# Patient Record
Sex: Female | Born: 1935
Health system: Southern US, Community
[De-identification: ages and names within clinical notes are randomized; demographics above are authoritative.]

## PROBLEM LIST (undated history)

## (undated) DIAGNOSIS — N3010 Interstitial cystitis (chronic) without hematuria: Secondary | ICD-10-CM

## (undated) DIAGNOSIS — E119 Type 2 diabetes mellitus without complications: Secondary | ICD-10-CM

## (undated) DIAGNOSIS — I471 Supraventricular tachycardia, unspecified: Secondary | ICD-10-CM

## (undated) DIAGNOSIS — K219 Gastro-esophageal reflux disease without esophagitis: Secondary | ICD-10-CM

## (undated) DIAGNOSIS — D126 Benign neoplasm of colon, unspecified: Secondary | ICD-10-CM

## (undated) DIAGNOSIS — K5900 Constipation, unspecified: Secondary | ICD-10-CM

## (undated) DIAGNOSIS — K648 Other hemorrhoids: Secondary | ICD-10-CM

## (undated) DIAGNOSIS — K449 Diaphragmatic hernia without obstruction or gangrene: Secondary | ICD-10-CM

## (undated) DIAGNOSIS — K2970 Gastritis, unspecified, without bleeding: Secondary | ICD-10-CM

## (undated) DIAGNOSIS — K2990 Gastroduodenitis, unspecified, without bleeding: Secondary | ICD-10-CM

## (undated) DIAGNOSIS — R1314 Dysphagia, pharyngoesophageal phase: Secondary | ICD-10-CM

## (undated) DIAGNOSIS — K649 Unspecified hemorrhoids: Secondary | ICD-10-CM

## (undated) DIAGNOSIS — K5730 Diverticulosis of large intestine without perforation or abscess without bleeding: Secondary | ICD-10-CM

## (undated) DIAGNOSIS — K222 Esophageal obstruction: Secondary | ICD-10-CM

## (undated) DIAGNOSIS — I10 Essential (primary) hypertension: Secondary | ICD-10-CM

## (undated) DIAGNOSIS — E785 Hyperlipidemia, unspecified: Secondary | ICD-10-CM

## (undated) DIAGNOSIS — R112 Nausea with vomiting, unspecified: Secondary | ICD-10-CM

## (undated) DIAGNOSIS — R011 Cardiac murmur, unspecified: Secondary | ICD-10-CM

## (undated) DIAGNOSIS — M1990 Unspecified osteoarthritis, unspecified site: Secondary | ICD-10-CM

## (undated) DIAGNOSIS — S62102A Fracture of unspecified carpal bone, left wrist, initial encounter for closed fracture: Secondary | ICD-10-CM

## (undated) DIAGNOSIS — Z9889 Other specified postprocedural states: Secondary | ICD-10-CM

## (undated) HISTORY — DX: Constipation, unspecified: K59.00

## (undated) HISTORY — DX: Diaphragmatic hernia without obstruction or gangrene: K44.9

## (undated) HISTORY — PX: TOTAL KNEE ARTHROPLASTY: SHX125

## (undated) HISTORY — DX: Interstitial cystitis (chronic) without hematuria: N30.10

## (undated) HISTORY — DX: Gastritis, unspecified, without bleeding: K29.70

## (undated) HISTORY — DX: Type 2 diabetes mellitus without complications: E11.9

## (undated) HISTORY — DX: Dysphagia, pharyngoesophageal phase: R13.14

## (undated) HISTORY — DX: Esophageal obstruction: K22.2

## (undated) HISTORY — DX: Benign neoplasm of colon, unspecified: D12.6

## (undated) HISTORY — DX: Essential (primary) hypertension: I10

## (undated) HISTORY — PX: RECTOCELE REPAIR: SHX761

## (undated) HISTORY — DX: Gastroduodenitis, unspecified, without bleeding: K29.70

## (undated) HISTORY — PX: ABDOMINAL HYSTERECTOMY: SHX81

## (undated) HISTORY — PX: CHOLECYSTECTOMY: SHX55

## (undated) HISTORY — DX: Unspecified hemorrhoids: K64.9

## (undated) HISTORY — DX: Diverticulosis of large intestine without perforation or abscess without bleeding: K57.30

## (undated) HISTORY — PX: LAPAROSCOPIC VAGINAL HYSTERECTOMY: SUR798

## (undated) HISTORY — PX: DILATION AND CURETTAGE OF UTERUS: SHX78

## (undated) HISTORY — DX: Gastroduodenitis, unspecified, without bleeding: K29.90

## (undated) HISTORY — DX: Gastro-esophageal reflux disease without esophagitis: K21.9

## (undated) HISTORY — DX: Unspecified osteoarthritis, unspecified site: M19.90

## (undated) HISTORY — DX: Cardiac murmur, unspecified: R01.1

## (undated) HISTORY — PX: BREAST LUMPECTOMY: SHX2

## (undated) HISTORY — DX: Supraventricular tachycardia, unspecified: I47.10

## (undated) HISTORY — DX: Other hemorrhoids: K64.8

## (undated) HISTORY — DX: Hyperlipidemia, unspecified: E78.5

## (undated) HISTORY — PX: FOOT SURGERY: SHX648

---

## 2000-08-08 ENCOUNTER — Encounter: Payer: Self-pay | Admitting: Family Medicine

## 2000-08-08 ENCOUNTER — Ambulatory Visit (HOSPITAL_COMMUNITY): Admission: RE | Admit: 2000-08-08 | Discharge: 2000-08-08 | Payer: Self-pay | Admitting: Family Medicine

## 2001-05-22 ENCOUNTER — Encounter: Admission: RE | Admit: 2001-05-22 | Discharge: 2001-05-22 | Payer: Self-pay | Admitting: Orthopaedic Surgery

## 2001-05-22 ENCOUNTER — Encounter: Payer: Self-pay | Admitting: Orthopaedic Surgery

## 2001-06-22 ENCOUNTER — Encounter: Admission: RE | Admit: 2001-06-22 | Discharge: 2001-06-22 | Payer: Self-pay | Admitting: Orthopaedic Surgery

## 2001-06-22 ENCOUNTER — Encounter: Payer: Self-pay | Admitting: Orthopaedic Surgery

## 2001-07-06 ENCOUNTER — Encounter: Payer: Self-pay | Admitting: Orthopaedic Surgery

## 2001-07-06 ENCOUNTER — Encounter: Admission: RE | Admit: 2001-07-06 | Discharge: 2001-07-06 | Payer: Self-pay | Admitting: Orthopaedic Surgery

## 2001-07-20 ENCOUNTER — Encounter: Payer: Self-pay | Admitting: Orthopaedic Surgery

## 2001-07-20 ENCOUNTER — Encounter: Admission: RE | Admit: 2001-07-20 | Discharge: 2001-07-20 | Payer: Self-pay | Admitting: Orthopaedic Surgery

## 2001-08-10 ENCOUNTER — Ambulatory Visit (HOSPITAL_COMMUNITY): Admission: RE | Admit: 2001-08-10 | Discharge: 2001-08-10 | Payer: Self-pay | Admitting: Family Medicine

## 2001-08-10 ENCOUNTER — Encounter: Payer: Self-pay | Admitting: Family Medicine

## 2002-03-08 HISTORY — PX: ESOPHAGOGASTRODUODENOSCOPY: SHX1529

## 2002-03-08 HISTORY — PX: COLONOSCOPY: SHX174

## 2002-04-30 ENCOUNTER — Ambulatory Visit (HOSPITAL_COMMUNITY): Admission: RE | Admit: 2002-04-30 | Discharge: 2002-04-30 | Payer: Self-pay | Admitting: Otolaryngology

## 2002-04-30 ENCOUNTER — Encounter: Payer: Self-pay | Admitting: Otolaryngology

## 2002-08-04 ENCOUNTER — Encounter: Payer: Self-pay | Admitting: Urology

## 2002-08-04 ENCOUNTER — Encounter: Admission: RE | Admit: 2002-08-04 | Discharge: 2002-08-04 | Payer: Self-pay | Admitting: Urology

## 2002-08-06 ENCOUNTER — Encounter (INDEPENDENT_AMBULATORY_CARE_PROVIDER_SITE_OTHER): Payer: Self-pay | Admitting: Specialist

## 2002-08-06 ENCOUNTER — Encounter (INDEPENDENT_AMBULATORY_CARE_PROVIDER_SITE_OTHER): Payer: Self-pay | Admitting: *Deleted

## 2002-08-06 ENCOUNTER — Ambulatory Visit (HOSPITAL_BASED_OUTPATIENT_CLINIC_OR_DEPARTMENT_OTHER): Admission: RE | Admit: 2002-08-06 | Discharge: 2002-08-06 | Payer: Self-pay | Admitting: Certified Registered"

## 2002-08-11 ENCOUNTER — Ambulatory Visit (HOSPITAL_COMMUNITY): Admission: RE | Admit: 2002-08-11 | Discharge: 2002-08-11 | Payer: Self-pay | Admitting: Family Medicine

## 2002-08-11 ENCOUNTER — Encounter: Payer: Self-pay | Admitting: Family Medicine

## 2002-12-09 ENCOUNTER — Ambulatory Visit (HOSPITAL_COMMUNITY): Admission: RE | Admit: 2002-12-09 | Discharge: 2002-12-09 | Payer: Self-pay | Admitting: Obstetrics and Gynecology

## 2002-12-09 ENCOUNTER — Encounter: Payer: Self-pay | Admitting: Obstetrics and Gynecology

## 2003-08-12 ENCOUNTER — Ambulatory Visit (HOSPITAL_COMMUNITY): Admission: RE | Admit: 2003-08-12 | Discharge: 2003-08-12 | Payer: Self-pay | Admitting: Family Medicine

## 2003-12-22 ENCOUNTER — Ambulatory Visit: Payer: Self-pay | Admitting: Gastroenterology

## 2004-01-02 ENCOUNTER — Ambulatory Visit: Payer: Self-pay | Admitting: Gastroenterology

## 2004-01-23 ENCOUNTER — Ambulatory Visit: Payer: Self-pay | Admitting: Gastroenterology

## 2004-01-23 HISTORY — PX: ESOPHAGOGASTRODUODENOSCOPY: SHX1529

## 2004-01-23 HISTORY — PX: COLONOSCOPY: SHX174

## 2004-02-19 HISTORY — PX: CATARACT EXTRACTION: SUR2

## 2004-04-20 ENCOUNTER — Ambulatory Visit: Payer: Self-pay | Admitting: Gastroenterology

## 2004-05-03 ENCOUNTER — Encounter (HOSPITAL_COMMUNITY): Admission: RE | Admit: 2004-05-03 | Discharge: 2004-06-02 | Payer: Self-pay | Admitting: Family Medicine

## 2004-06-11 ENCOUNTER — Ambulatory Visit: Payer: Self-pay | Admitting: Podiatry

## 2004-06-15 ENCOUNTER — Ambulatory Visit: Payer: Self-pay | Admitting: Podiatry

## 2004-08-14 ENCOUNTER — Ambulatory Visit (HOSPITAL_COMMUNITY): Admission: RE | Admit: 2004-08-14 | Discharge: 2004-08-14 | Payer: Self-pay | Admitting: Family Medicine

## 2004-11-14 ENCOUNTER — Ambulatory Visit (HOSPITAL_COMMUNITY): Admission: RE | Admit: 2004-11-14 | Discharge: 2004-11-14 | Payer: Self-pay | Admitting: Family Medicine

## 2005-04-12 ENCOUNTER — Ambulatory Visit: Payer: Self-pay | Admitting: Gastroenterology

## 2005-07-04 ENCOUNTER — Ambulatory Visit (HOSPITAL_COMMUNITY): Admission: RE | Admit: 2005-07-04 | Discharge: 2005-07-04 | Payer: Self-pay | Admitting: Otolaryngology

## 2005-08-15 ENCOUNTER — Ambulatory Visit (HOSPITAL_COMMUNITY): Admission: RE | Admit: 2005-08-15 | Discharge: 2005-08-15 | Payer: Self-pay | Admitting: Family Medicine

## 2005-09-18 ENCOUNTER — Ambulatory Visit: Payer: Self-pay | Admitting: Gastroenterology

## 2005-09-25 ENCOUNTER — Ambulatory Visit: Payer: Self-pay | Admitting: Gastroenterology

## 2005-09-25 ENCOUNTER — Encounter (INDEPENDENT_AMBULATORY_CARE_PROVIDER_SITE_OTHER): Payer: Self-pay | Admitting: *Deleted

## 2005-09-25 DIAGNOSIS — K2970 Gastritis, unspecified, without bleeding: Secondary | ICD-10-CM | POA: Insufficient documentation

## 2005-09-25 DIAGNOSIS — K2990 Gastroduodenitis, unspecified, without bleeding: Secondary | ICD-10-CM

## 2005-09-25 HISTORY — PX: COLONOSCOPY: SHX174

## 2005-09-25 HISTORY — PX: ESOPHAGOGASTRODUODENOSCOPY: SHX1529

## 2006-07-31 ENCOUNTER — Ambulatory Visit: Payer: Self-pay | Admitting: Gastroenterology

## 2006-08-18 ENCOUNTER — Ambulatory Visit (HOSPITAL_COMMUNITY): Admission: RE | Admit: 2006-08-18 | Discharge: 2006-08-18 | Payer: Self-pay | Admitting: Family Medicine

## 2007-07-02 DIAGNOSIS — D126 Benign neoplasm of colon, unspecified: Secondary | ICD-10-CM | POA: Insufficient documentation

## 2007-07-02 DIAGNOSIS — K222 Esophageal obstruction: Secondary | ICD-10-CM | POA: Insufficient documentation

## 2007-07-02 DIAGNOSIS — K5730 Diverticulosis of large intestine without perforation or abscess without bleeding: Secondary | ICD-10-CM | POA: Insufficient documentation

## 2007-07-02 DIAGNOSIS — K648 Other hemorrhoids: Secondary | ICD-10-CM | POA: Insufficient documentation

## 2007-07-02 DIAGNOSIS — R1314 Dysphagia, pharyngoesophageal phase: Secondary | ICD-10-CM | POA: Insufficient documentation

## 2007-07-02 DIAGNOSIS — K449 Diaphragmatic hernia without obstruction or gangrene: Secondary | ICD-10-CM | POA: Insufficient documentation

## 2007-07-02 DIAGNOSIS — K219 Gastro-esophageal reflux disease without esophagitis: Secondary | ICD-10-CM | POA: Insufficient documentation

## 2007-07-02 DIAGNOSIS — K5900 Constipation, unspecified: Secondary | ICD-10-CM | POA: Insufficient documentation

## 2007-08-19 ENCOUNTER — Ambulatory Visit (HOSPITAL_COMMUNITY): Admission: RE | Admit: 2007-08-19 | Discharge: 2007-08-19 | Payer: Self-pay | Admitting: Unknown Physician Specialty

## 2008-01-19 ENCOUNTER — Ambulatory Visit (HOSPITAL_COMMUNITY): Admission: RE | Admit: 2008-01-19 | Discharge: 2008-01-19 | Payer: Self-pay | Admitting: Family Medicine

## 2008-03-01 ENCOUNTER — Ambulatory Visit: Payer: Self-pay | Admitting: Gastroenterology

## 2008-03-01 DIAGNOSIS — N3010 Interstitial cystitis (chronic) without hematuria: Secondary | ICD-10-CM | POA: Insufficient documentation

## 2008-03-01 DIAGNOSIS — E119 Type 2 diabetes mellitus without complications: Secondary | ICD-10-CM | POA: Insufficient documentation

## 2008-03-04 ENCOUNTER — Encounter: Payer: Self-pay | Admitting: Gastroenterology

## 2008-03-04 ENCOUNTER — Ambulatory Visit (HOSPITAL_COMMUNITY): Admission: RE | Admit: 2008-03-04 | Discharge: 2008-03-04 | Payer: Self-pay | Admitting: Gastroenterology

## 2008-03-21 ENCOUNTER — Ambulatory Visit: Payer: Self-pay | Admitting: Gastroenterology

## 2008-03-21 ENCOUNTER — Ambulatory Visit (HOSPITAL_COMMUNITY): Admission: RE | Admit: 2008-03-21 | Discharge: 2008-03-21 | Payer: Self-pay | Admitting: Gastroenterology

## 2008-03-21 HISTORY — PX: ESOPHAGEAL MANOMETRY: SHX1526

## 2008-03-29 ENCOUNTER — Telehealth: Payer: Self-pay | Admitting: Gastroenterology

## 2008-04-01 ENCOUNTER — Ambulatory Visit: Payer: Self-pay | Admitting: Gastroenterology

## 2008-04-01 DIAGNOSIS — K224 Dyskinesia of esophagus: Secondary | ICD-10-CM | POA: Insufficient documentation

## 2008-04-05 ENCOUNTER — Inpatient Hospital Stay (HOSPITAL_COMMUNITY): Admission: RE | Admit: 2008-04-05 | Discharge: 2008-04-08 | Payer: Self-pay | Admitting: Orthopaedic Surgery

## 2008-04-07 ENCOUNTER — Ambulatory Visit: Payer: Self-pay | Admitting: Vascular Surgery

## 2008-04-07 ENCOUNTER — Encounter (INDEPENDENT_AMBULATORY_CARE_PROVIDER_SITE_OTHER): Payer: Self-pay | Admitting: Orthopaedic Surgery

## 2008-04-08 ENCOUNTER — Inpatient Hospital Stay: Admission: RE | Admit: 2008-04-08 | Discharge: 2008-05-05 | Payer: Self-pay | Admitting: Internal Medicine

## 2008-05-10 ENCOUNTER — Encounter (HOSPITAL_COMMUNITY): Admission: RE | Admit: 2008-05-10 | Discharge: 2008-06-09 | Payer: Self-pay | Admitting: Orthopaedic Surgery

## 2008-06-06 ENCOUNTER — Encounter: Admission: RE | Admit: 2008-06-06 | Discharge: 2008-06-06 | Payer: Self-pay | Admitting: Orthopaedic Surgery

## 2008-06-13 ENCOUNTER — Encounter (HOSPITAL_COMMUNITY): Admission: RE | Admit: 2008-06-13 | Discharge: 2008-07-15 | Payer: Self-pay | Admitting: Orthopaedic Surgery

## 2008-07-19 ENCOUNTER — Encounter (HOSPITAL_COMMUNITY): Admission: RE | Admit: 2008-07-19 | Discharge: 2008-07-22 | Payer: Self-pay | Admitting: Orthopaedic Surgery

## 2008-08-23 ENCOUNTER — Ambulatory Visit (HOSPITAL_COMMUNITY): Admission: RE | Admit: 2008-08-23 | Discharge: 2008-08-23 | Payer: Self-pay | Admitting: Family Medicine

## 2008-11-22 ENCOUNTER — Ambulatory Visit: Payer: Self-pay | Admitting: Gastroenterology

## 2008-11-22 DIAGNOSIS — K861 Other chronic pancreatitis: Secondary | ICD-10-CM | POA: Insufficient documentation

## 2009-04-28 ENCOUNTER — Ambulatory Visit: Payer: Self-pay | Admitting: Gastroenterology

## 2009-04-28 LAB — CONVERTED CEMR LAB
Ferritin: 45.8 ng/mL (ref 10.0–291.0)
Folate: 16.1 ng/mL
Saturation Ratios: 15.4 % — ABNORMAL LOW (ref 20.0–50.0)
Tissue Transglutaminase Ab, IgA: 0.6 units (ref <7)
Transferrin: 254.7 mg/dL (ref 212.0–360.0)
Vitamin B-12: 300 pg/mL (ref 211–911)

## 2009-06-29 ENCOUNTER — Encounter (INDEPENDENT_AMBULATORY_CARE_PROVIDER_SITE_OTHER): Payer: Self-pay | Admitting: *Deleted

## 2009-06-29 ENCOUNTER — Ambulatory Visit: Payer: Self-pay | Admitting: Gastroenterology

## 2009-06-29 DIAGNOSIS — R1013 Epigastric pain: Secondary | ICD-10-CM | POA: Insufficient documentation

## 2009-06-29 DIAGNOSIS — Z86010 Personal history of colonic polyps: Secondary | ICD-10-CM | POA: Insufficient documentation

## 2009-06-29 DIAGNOSIS — R197 Diarrhea, unspecified: Secondary | ICD-10-CM | POA: Insufficient documentation

## 2009-06-29 DIAGNOSIS — M1990 Unspecified osteoarthritis, unspecified site: Secondary | ICD-10-CM | POA: Insufficient documentation

## 2009-06-30 LAB — CONVERTED CEMR LAB
ALT: 15 units/L (ref 0–35)
Albumin: 3.9 g/dL (ref 3.5–5.2)
Alkaline Phosphatase: 67 units/L (ref 39–117)
Basophils Absolute: 0 10*3/uL (ref 0.0–0.1)
Bilirubin, Direct: 0 mg/dL (ref 0.0–0.3)
Calcium: 9.7 mg/dL (ref 8.4–10.5)
Creatinine, Ser: 0.7 mg/dL (ref 0.4–1.2)
Eosinophils Absolute: 0.1 10*3/uL (ref 0.0–0.7)
Eosinophils Relative: 1.5 % (ref 0.0–5.0)
Ferritin: 19.3 ng/mL (ref 10.0–291.0)
GFR calc non Af Amer: 91.49 mL/min (ref >60)
Lipase: 22 units/L (ref 11.0–59.0)
MCV: 87.1 fL (ref 78.0–100.0)
Magnesium: 1.8 mg/dL (ref 1.5–2.5)
Monocytes Absolute: 0.5 10*3/uL (ref 0.1–1.0)
Neutrophils Relative %: 64.2 % (ref 43.0–77.0)
Platelets: 269 10*3/uL (ref 150.0–400.0)
Potassium: 4.8 meq/L (ref 3.5–5.1)
RDW: 15.3 % — ABNORMAL HIGH (ref 11.5–14.6)
Sed Rate: 19 mm/hr (ref 0–22)
Sodium: 144 meq/L (ref 135–145)
TSH: 1.13 microintl units/mL (ref 0.35–5.50)
Total Protein: 6.8 g/dL (ref 6.0–8.3)
Transferrin: 281 mg/dL (ref 212.0–360.0)
Vitamin B-12: 457 pg/mL (ref 211–911)
WBC: 9.2 10*3/uL (ref 4.5–10.5)

## 2009-07-05 ENCOUNTER — Ambulatory Visit: Payer: Self-pay | Admitting: Gastroenterology

## 2009-07-05 DIAGNOSIS — R1012 Left upper quadrant pain: Secondary | ICD-10-CM | POA: Insufficient documentation

## 2009-07-05 HISTORY — PX: COLONOSCOPY: SHX174

## 2009-07-05 HISTORY — PX: ESOPHAGOGASTRODUODENOSCOPY: SHX1529

## 2009-07-05 LAB — CONVERTED CEMR LAB: UREASE: NEGATIVE

## 2009-07-10 ENCOUNTER — Encounter: Payer: Self-pay | Admitting: Gastroenterology

## 2009-09-11 ENCOUNTER — Ambulatory Visit (HOSPITAL_COMMUNITY): Admission: RE | Admit: 2009-09-11 | Discharge: 2009-09-11 | Payer: Self-pay | Admitting: Family Medicine

## 2010-03-20 NOTE — Letter (Signed)
Summary: Diabetic Instructions  [facility]  [address] [place], [place] [address]   Phone: [phone]  Fax: [phone]    AMEA MCPHAIL 1934/02/13 MRN: [id number]   _X  _   ORAL DIABETIC MEDICATION INSTRUCTIONS  The day before your procedure:   Take your diabetic pill as you do normally  The day of your procedure:   Do not take your diabetic pill    We will check your blood sugar levels during the admission process and again in Recovery before discharging you home  ________________________________________________________________________  _  _   INSULIN (LONG ACTING) MEDICATION INSTRUCTIONS (Lantus, NPH, 70/30, Humulin, Novolin-N)   The day before your procedure:   Take  your regular evening dose    The day of your procedure:   Do not take your morning dose    _  _   INSULIN (SHORT ACTING) MEDICATION INSTRUCTIONS (Regular, Humulog, Novolog)   The day before your procedure:   Do not take your evening dose   The day of your procedure:   Do not take your morning dose   _  _   INSULIN PUMP MEDICATION INSTRUCTIONS  We will contact the physician managing your diabetic care for written dosage instructions for the day before your procedure and the day of your procedure.  Once we have received the instructions, we will contact you.

## 2010-03-20 NOTE — Procedures (Signed)
Summary: Upper Endoscopy  Patient: [name] Note: All result statuses are Final unless otherwise noted.  Tests: (1) Upper Endoscopy (EGD)   EGD Upper Endoscopy       DONE     [facility]     [address]     [place], [place]  [address]           ENDOSCOPY PROCEDURE REPORT           PATIENT:  Deborah Gray, Deborah Gray  MR#:  [id number]     BIRTHDATE:  03-13-1933, 75 yrs. old  GENDER:  female           ENDOSCOPIST:  Vania Rea. Jarold Motto, MD, [facility]     Referred by:           PROCEDURE DATE:  07/05/2009     PROCEDURE:  EGD with biopsy     ASA CLASS:  Class II     INDICATIONS:  abdominal pain despite treatment           MEDICATIONS:   There was residual sedation effect present from     prior procedure., Fentanyl 25 mcg IV, Versed 2 mg IV     TOPICAL ANESTHETIC:           DESCRIPTION OF PROCEDURE:   After the risks benefits and     alternatives of the procedure were thoroughly explained, informed     consent was obtained.  The LB GIF-H180 [id number] endoscope was     introduced through the mouth and advanced to the second portion of     the duodenum, without limitations.  The instrument was slowly     withdrawn as the mucosa was fully examined.     <<PROCEDUREIMAGES>>           Gastropathy was found. GRANULAR,NODULAR ANTRUM BIOPSIED AND ALSO     CLO EXAM DONE.  Normal GE junction was noted.  Normal duodenal     folds were noted. SMALL BOWEL BIOPSY DONE.  The esophagus and     gastroesophageal junction were completely normal in appearance.     Retroflexed views revealed no abnormalities.    The scope was then     withdrawn from the patient and the procedure completed.           COMPLICATIONS:  None           ENDOSCOPIC IMPRESSION:     1) Gastropathy     2) Normal GE junction     3) Normal duodenal folds     4) Normal esophagus     1.R/O H.PYLORI     2.R/O CELIAC DISEASE     3.PROBABLE IBS.     RECOMMENDATIONS:     1) Await biopsy results     2) continue current  medications           REPEAT EXAM:  No           ______________________________     Vania Rea. Jarold Motto, MD, [facility]           CC:  Lilyan Punt, MD           n.     Rosalie DoctorMarland Kitchen   Vania Rea. Liam Bossman at 07/05/2009 03:13 PM           [place], [place], [phone]  Note: An exclamation mark (!) indicates a result that was not dispersed into the flowsheet. Document Creation Date: 07/06/2009 5:07 PM _______________________________________________________________________  (1) Order result status: Final Collection or observation date-time:  07/05/2009 15:07 Requested date-time:  Receipt date-time:  Reported date-time:  Referring Physician:   Ordering Physician: Sheryn Bison [phone]) Specimen Source:  Source: Launa Grill Order Number: [id number] Lab site:

## 2010-03-20 NOTE — Letter (Signed)
Summary: [facility] Instructions  [facility]  [name], [place] [address]   Phone: [phone]  Fax: [phone]       Deborah Gray    May 27, 1933    MRN: [id number]        Procedure Day Dorna Bloom: Wednesday, 07/05/09     Arrival Time: 1:30      Procedure Time: 2:30     Location of Procedure:                    _X _  [facility] (4th Floor)                        PREPARATION FOR COLONOSCOPY WITH MOVIPREP   Starting 5 days prior to your procedure 06/30/09 do not eat nuts, seeds, popcorn, corn, beans, peas,  salads, or any raw vegetables.  Do not take any fiber supplements (e.g. Metamucil, Citrucel, and Benefiber).  THE DAY BEFORE YOUR PROCEDURE         DATE: 07/04/09   DAY: Tuesday  1.  Drink clear liquids the entire day-NO SOLID FOOD  2.  Do not drink anything colored red or purple.  Avoid juices with pulp.  No orange juice.  3.  Drink at least 64 oz. (8 glasses) of fluid/clear liquids during the day to prevent dehydration and help the prep work efficiently.  CLEAR LIQUIDS INCLUDE: Water Jello Ice Popsicles Tea (sugar ok, no milk/cream) Powdered fruit flavored drinks Coffee (sugar ok, no milk/cream) Gatorade Juice: apple, white grape, white cranberry  Lemonade Clear bullion, consomm, broth Carbonated beverages (any kind) Strained chicken noodle soup Hard Candy                             4.  In the morning, mix first dose of MoviPrep solution:    Empty 1 Pouch A and 1 Pouch B into the disposable container    Add lukewarm drinking water to the top line of the container. Mix to dissolve    Refrigerate (mixed solution should be used within 24 hrs)  5.  Begin drinking the prep at 5:00 p.m. The MoviPrep container is divided by 4 marks.   Every 15 minutes drink the solution down to the next mark (approximately 8 oz) until the full liter is complete.   6.  Follow completed prep with 16 oz of clear liquid of your choice  (Nothing red or purple).  Continue to drink clear liquids until bedtime.  7.  Before going to bed, mix second dose of MoviPrep solution:    Empty 1 Pouch A and 1 Pouch B into the disposable container    Add lukewarm drinking water to the top line of the container. Mix to dissolve    Refrigerate  THE DAY OF YOUR PROCEDURE      DATE: 07/05/09   DAY: Wednesday  Beginning at 9:30 a.m. (5 hours before procedure):         1. Every 15 minutes, drink the solution down to the next mark (approx 8 oz) until the full liter is complete.  2. Follow completed prep with 16 oz. of clear liquid of your choice.    3. You may drink clear liquids until 12:30 (2 HOURS BEFORE PROCEDURE).   MEDICATION INSTRUCTIONS  Unless otherwise instructed, you should take regular prescription medications with a small sip of water   as early as possible the morning  of your procedure.  Diabetic patients - see separate instructions.                 OTHER INSTRUCTIONS  You will need a responsible adult at least 75 years of age to accompany you and drive you home.   This person must remain in the waiting room during your procedure.  Wear loose fitting clothing that is easily removed.  Leave jewelry and other valuables at home.  However, you may wish to bring a book to read or  an iPod/MP3 player to listen to music as you wait for your procedure to start.  Remove all body piercing jewelry and leave at home.  Total time from sign-in until discharge is approximately 2-3 hours.  You should go home directly after your procedure and rest.  You can resume normal activities the  day after your procedure.  The day of your procedure you should not:   Drive   Make legal decisions   Operate machinery   Drink alcohol   Return to work  You will receive specific instructions about eating, activities and medications before you leave.    The above instructions have been reviewed and explained to me by    _______________________    I fully understand and can verbalize these instructions _____________________________ Date _________

## 2010-03-20 NOTE — Letter (Signed)
Summary: Patient [facility] Biopsy Results  [facility]  [address] [place], [place] [address]   Phone: [phone]  Fax: [phone]        Jul 10, 2009 MRN: [id number]    Deborah Gray [address] [place], [place]  [address]    Dear Ms. Capozzi,  I am pleased to inform you that the biopsies taken during your recent endoscopic examination did not show any evidence of cancer upon pathologic examination.  Additional information/recommendations:  __No further action is needed at this time.  Please follow-up with      your primary care physician for your other healthcare needs.  __ Please call [phone] to schedule a return visit to review      your condition.  _XX_ Continue with the treatment plan as outlined on the day of your      exam.  __ You should have a repeat endoscopic examination for this problem              in _ months/years.   Please call us if you are having persistent problems or have questions about your condition that have not been fully answered at this time.  Sincerely,  Mardella Layman MD [facility]  This letter has been electronically signed by your physician.  Appended Document: Patient Notice-Endo Biopsy Results letter mailed

## 2010-03-20 NOTE — Assessment & Plan Note (Signed)
Summary: stomach pain,nausea...em   History of Present Illness Visit Type: Follow-up Visit Primary GI MD: Sheryn Bison MD FACP FAGA Primary Provider: Simone Curia, MD Requesting Provider: n/a Chief Complaint: Intermittant upper abd pain after meals with some nausea at bedtime also. Pt states she has delayed gastic empyting.  History of Present Illness:   Very Pleasant elderly white female with lateral onset diabetes which is not insulin-dependent with associated idiopathic chronic pancreatitis, recurrent bacterial overgrowth syndrome, and diffuse GI motility problems with documented nutcracker esophagus, chronic cost to patient requiring Amitiza 24 micrograms twice a day, and chronic nonspecific gas, bloating, and alternating diarrhea and constipation. She currently is on fiber supplements, calcium with vitamin D, Glucophage, and Creon tablets 3 times a day.  She has a handwritten list of problems which are reviewed in detail with her over approximately an hour my time. This includes bleeding, diffuse abdominal pain, gastroparesis probably exacerbated by VESIcare, worsening of her problems with  Align probiotic use, and also her concern over repeat colon polyps. She has questions about use of GI cocktail, metoclopramide, and apparently had been on multiple antibiotics over the last year. Review her chart shows no evidence of celiac disease or scleroderma. She has had no anorexia, weight loss, or systemic complaints. She denies abuse of alcohol, cigarettes, or NSAIDs.She is on daily Nexium 40 mg.   GI Review of Systems    Reports abdominal pain, belching, bloating, and  nausea.     Location of  Abdominal pain: upper abdomen.    Denies acid reflux, chest pain, dysphagia with liquids, dysphagia with solids, heartburn, loss of appetite, vomiting, vomiting blood, weight loss, and  weight gain.        Denies anal fissure, black tarry stools, change in bowel habit, constipation, diarrhea,  diverticulosis, fecal incontinence, heme positive stool, hemorrhoids, irritable bowel syndrome, jaundice, light color stool, liver problems, rectal bleeding, and  rectal pain.    Current Medications (verified): 1)  Creon 6000 Unit Cpep (Pancrelipase (Lip-Prot-Amyl)) .Marland Kitchen.. 1 Tablet By Mouth Once Daily 2)  Starlix 120 Mg Tabs (Nateglinide) .Marland Kitchen.. 1 Tablet By Mouth Once Daily 3)  Glucophage 500 Mg Tabs (Metformin Hcl) .Marland Kitchen.. 1 Table By Mouth Two Times A Day 4)  Diovan 80 Mg Tabs (Valsartan) .Marland Kitchen.. 1 Tablet By Mouth Once Daily 5)  Zetia 10 Mg Tabs (Ezetimibe) .Marland Kitchen.. 1 Tablet By Mouth Once Daily 6)  Nexium 40 Mg Cpdr (Esomeprazole Magnesium) .Marland Kitchen.. 1 Tablet By Mouth Once Daily 7)  Urelle 81 Mg Tabs (Meth-Hyo-M Bl-Na Phos-Ph Sal) .Marland Kitchen.. 1 Tablet By Mouth Three Times A Day 8)  Amitiza 24 Mcg Caps (Lubiprostone) .Marland Kitchen.. 1 Tablet By Mouth Two Times A Day 9)  Antivert 25 Mg Tabs (Meclizine Hcl) .... As Needed For Dizziness 10)  Fibercon 625 Mg  Tabs (Calcium Polycarbophil) .... 2 Tablets in Morning, 1 At Bedtime 11)  Ester-C 500-60 Mg Tabs (Vitamin Mixture) .Marland Kitchen.. 1 Tablet By Mouth Three Times A Week 12)  Calcium Carbonate-Vitamin D 600-400 Mg-Unit  Tabs (Calcium Carbonate-Vitamin D) .... Once Daily 13)  Dukes Magic Mouthwash .... Swish and Swallow 5 Cc's Three Times A Day As Needed 14)  Nasonex 50 Mcg/act Susp (Mometasone Furoate) .... Once Daily  Allergies (verified): 1)  ! Sulfa  Past History:  Past medical, surgical, family and social histories (including risk factors) reviewed for relevance to current acute and chronic problems.  Past Medical History: DM (ICD-250.00) Hx of DYSPHAGIA, PHARYNGOESOPHAGEAL PHASE (ICD-787.24) ACID REFLUX DISEASE (ICD-530.81) CONSTIPATION (ICD-564.00) COLONIC POLYPS, ADENOMATOUS (ICD-211.3)  HEMORRHOIDS, INTERNAL (ICD-455.0) HIATAL HERNIA (ICD-553.3) ESOPHAGEAL STRICTURE (ICD-530.3) GASTRITIS (ICD-535.50) DIVERTICULOSIS, COLON (ICD-562.10)    Past Surgical History: Reviewed  history from 11/22/2008 and no changes required. D&C (miscarriage) Cholecystectomy 1990 Hysterectromy 1976 Rectocele repair Breast-Lumpectomy Knee Replacement Foot surgery  Family History: Reviewed history from 03/01/2008 and no changes required. Family History of Ovarian Cancer: Mother Family History of Breast Cancer:Sister Family History of Liver Cancer:Sister Family History of Colon Cancer: 1st cousin Family History of Diabetes: Maternal Aunt and Uncle, Father Family History of Liver Disease/Cirrhosis: 1st cousin  Social History: Reviewed history from 03/01/2008 and no changes required. Occupation: Retired Geologist, engineering Patient has never smoked.  Alcohol Use - no Illicit Drug Use - no Patient gets regular exercise.  Review of Systems  The patient denies allergy/sinus, anemia, anxiety-new, arthritis/joint pain, back pain, blood in urine, breast changes/lumps, change in vision, confusion, cough, coughing up blood, depression-new, fainting, fatigue, fever, headaches-new, hearing problems, heart murmur, heart rhythm changes, itching, menstrual pain, muscle pains/cramps, night sweats, nosebleeds, pregnancy symptoms, shortness of breath, skin rash, sleeping problems, sore throat, swelling of feet/legs, swollen lymph glands, thirst - excessive , urination - excessive , urination changes/pain, urine leakage, vision changes, and voice change.   General:  Complains of fatigue. Eyes:  Complains of photophobia; denies blurring, diplopia, irritation, discharge, vision loss, scotoma, and eye pain. ENT:  Complains of hoarseness; dry mouth syndrome from anti-cholinergic use.. CV:  Denies chest pains, angina, palpitations, syncope, dyspnea on exertion, orthopnea, PND, peripheral edema, and claudication. Resp:  Denies dyspnea at rest, dyspnea with exercise, cough, sputum, wheezing, coughing up blood, and pleurisy. GI:  Complains of indigestion/heartburn, abdominal pain, gas/bloating,  constipation, and change in bowel habits; denies difficulty swallowing, pain on swallowing, nausea, vomiting, vomiting blood, jaundice, diarrhea, bloody BM's, black BMs, and fecal incontinence. GU:  Complains of urinary burning, urinary frequency, and urinary incontinence; denies blood in urine, nocturnal urination, abnormal vaginal bleeding, amenorrhea, menorrhagia, vaginal discharge, pelvic pain, genital sores, painful intercourse, and decreased libido; She is under the care of Dr. Darvin Neighbours in urology. Previously she has had rectocele repair and hemorrhoid surgery by Dr. Lorelee New. She is status post cholecystectomy and hysterectomy and lumpectomy, and apparently bilateral knee replacement surgery.. MS:  Complains of joint pain / LOM, joint swelling, and joint stiffness; denies joint deformity, low back pain, muscle weakness, muscle cramps, muscle atrophy, leg pain at night, leg pain with exertion, and shoulder pain / LOM hand / wrist pain (CTS). Derm:  Complains of itching; diffuse pruritus without associated rash apparently.Marland Kitchen Neuro:  Complains of weakness and abnormal sensation; denies paralysis, seizures, syncope, tremors, vertigo, transient blindness, frequent falls, frequent headaches, difficulty walking, headache, sciatica, radiculopathy other:, restless legs, memory loss, and confusion. Psych:  Denies depression, anxiety, memory loss, suicidal ideation, hallucinations, paranoia, phobia, and confusion. Endo:  Denies cold intolerance, heat intolerance, polydipsia, polyphagia, polyuria, unusual weight change, and hirsutism. Heme:  Denies bruising, bleeding, enlarged lymph nodes, and pagophagia. Allergy:  Complains of hay fever; denies hives, rash, sneezing, and recurrent infections.  Vital Signs:  Patient profile:   77 year old female Height:      64 inches Weight:      167 pounds BMI:     28.77 Pulse rate:   70 / minute Pulse rhythm:   regular BP sitting:   126 / 74  (right arm) Cuff  size:   regular  Vitals Entered By: Christie Nottingham CMA Duncan Dull) (Jun 29, 2009 11:25 AM)  Physical Exam  General:  Well developed, well nourished, no acute distress.healthy appearing.   Head:  Normocephalic and atraumatic. Eyes:  PERRLA, no icterus.exam deferred to patient's ophthalmologist.   Neck:  Supple; no masses or thyromegaly. Lungs:  Clear throughout to auscultation. Heart:  Regular rate and rhythm; no murmurs, rubs,  or bruits. Abdomen:  Soft, nontender and nondistended. No masses, hepatosplenomegaly or hernias noted. Normal bowel sounds. Msk:  Symmetrical with no gross deformities. Normal posture.decreased ROM and arthritic changes.   Pulses:  Normal pulses noted. Extremities:  No clubbing, cyanosis, edema or deformities noted. Neurologic:  Alert and  oriented x4;  grossly normal neurologically. Skin:  Intact without significant lesions or rashes. Cervical Nodes:  No significant cervical adenopathy. Psych:  Alert and cooperative. Normal mood and affect.    Impression & Recommendations:  Problem # 1:  PERSONAL HISTORY OF COLONIC POLYPS (ICD-V12.72) Assessment Unchanged Followup colonoscopy at her convenience. We have given her a trial of daily Florstar pending further evaluation. Orders: TLB-CBC Platelet - w/Differential (85025-CBCD) TLB-BMP (Basic Metabolic Panel-BMET) (80048-METABOL) TLB-Hepatic/Liver Function Pnl (80076-HEPATIC) TLB-TSH (Thyroid Stimulating Hormone) (84443-TSH) TLB-B12, Serum-Total ONLY (14782-N56) TLB-Ferritin (82728-FER) TLB-Folic Acid (Folate) (82746-FOL) TLB-IBC Pnl (Iron/FE;Transferrin) (83550-IBC) TLB-Amylase (82150-AMYL) TLB-Lipase (83690-LIPASE) TLB-Magnesium (Mg) (83735-MG) TLB-Sedimentation Rate (ESR) (85652-ESR)  Problem # 2:  ABDOMINAL PAIN, EPIGASTRIC (ICD-789.06) Assessment: Unchanged Followup endoscopy and we will check for H. pylori, celiac disease, consider gastric emptying scan. Orders: TLB-CBC Platelet - w/Differential  (85025-CBCD) TLB-BMP (Basic Metabolic Panel-BMET) (80048-METABOL) TLB-Hepatic/Liver Function Pnl (80076-HEPATIC) TLB-TSH (Thyroid Stimulating Hormone) (84443-TSH) TLB-B12, Serum-Total ONLY (21308-M57) TLB-Ferritin (82728-FER) TLB-Folic Acid (Folate) (82746-FOL) TLB-IBC Pnl (Iron/FE;Transferrin) (83550-IBC) TLB-Amylase (82150-AMYL) TLB-Lipase (83690-LIPASE) TLB-Magnesium (Mg) (83735-MG) TLB-Sedimentation Rate (ESR) (85652-ESR)  Problem # 3:  CHRONIC PANCREATITIS (ICD-577.1) Assessment: Unchanged History the pancreas divisum and associated chronic pancreatitis which has responded well to pancreatic extract which we will continue. She is on chronic Nexium, and I have ordered serum magnesium level and other screening labs. We may need to consider endoscopic ultrasound examination of her pancreas, repeat treatment for bacterial overgrowth syndrome, et Karie Soda. She is a very complex and complicated case.  Problem # 4:  ESOPHAGEAL MOTILITY DISORDER (ICD-530.5) Assessment: Improved  Problem # 5:  CHRONIC INTERSTITIAL CYSTITIS (ICD-595.1) Assessment: Unchanged  Problem # 6:  DM (ICD-250.00) Assessment: Improved Continue diabetic medications as per primary care. She is a patient of Dr. Lilyan Punt in [place].she That is on Starlix and 20 mg daily and Glucophage 500 mg twice a day. Repeat B12 level has been ordered.  Problem # 7:  DEGENERATIVE JOINT DISEASE (ICD-715.90) Assessment: Unchanged No history of NSAID abuse that I am aware of.  Patient Instructions: 1)  Please go to the basement for lab work. 2)  Begin Florastor daily.  OTC 3)  You are scheduled for an endoscopy and a colonoscopy. 4)  The medication list was reviewed and reconciled.  All changed / newly prescribed medications were explained.  A complete medication list was provided to the patient / caregiver. 5)  Copy sent to : Dr. Lilyan Punt 6)  Colonoscopy and Flexible Sigmoidoscopy brochure given.  7)  Conscious Sedation  brochure given.  8)  Upper Endoscopy brochure given.  9)  Please continue current medications.   Appended Document: stomach pain,nausea...em    Clinical Lists Changes  Medications: Added new medication of MOVIPREP 100 GM  SOLR (PEG-KCL-NACL-NASULF-NA ASC-C) As per prep instructions. - Signed Added new medication of FLORASTOR 250 MG CAPS (SACCHAROMYCES BOULARDII) daily Rx of MOVIPREP 100 GM  SOLR (PEG-KCL-NACL-NASULF-NA ASC-C) As per prep instructions.;  #  1 x 0;  Signed;  Entered by: Ashok Cordia RN;  Authorized by: Mardella Layman MD [facility];  Method used: Electronically to [facility]*, [address], [place], [place], [place]  [address], Ph: [phone], Fax: [phone] Orders: Added new Test order of Colon/Endo (Colon/Endo) - Signed    Prescriptions: MOVIPREP 100 GM  SOLR (PEG-KCL-NACL-NASULF-NA ASC-C) As per prep instructions.  #1 x 0   Entered by:   Ashok Cordia RN   Authorized by:   Mardella Layman MD [facility]   Signed by:   Ashok Cordia RN on 06/29/2009   Method used:   Electronically to        [facility]* (retail)       [address] [place], [place]  [address]       Ph: [phone]       Fax: [phone]   RxID:   [id number]

## 2010-03-20 NOTE — Miscellaneous (Signed)
Summary: Clo test  Clinical Lists Changes  Problems: Added new problem of ABDOMINAL PAIN, LEFT UPPER QUADRANT (ICD-789.02) Orders: Added new Test order of TLB-H Pylori Screen Gastric Biopsy (83013-CLOTEST) - Signed

## 2010-03-20 NOTE — Procedures (Signed)
Summary: Colonoscopy  Patient: [name] Note: All result statuses are Final unless otherwise noted.  Tests: (1) Colonoscopy (COL)   COL Colonoscopy           DONE     [facility]     [address]     [place], [place]  [address]           COLONOSCOPY PROCEDURE REPORT           PATIENT:  Deborah, Gray  MR#:  [id number]     BIRTHDATE:  1933/07/03, 75 yrs. old  GENDER:  female     ENDOSCOPIST:  Vania Rea. Jarold Motto, MD, [facility]     REF. BY:     PROCEDURE DATE:  07/05/2009     PROCEDURE:  Surveillance Colonoscopy     ASA CLASS:  Class II     INDICATIONS:  colorectal cancer screening, average risk, abdominal     pain, change in bowel habits     MEDICATIONS:   Fentanyl 75 mcg IV, Versed 8 mg IV           DESCRIPTION OF PROCEDURE:   After the risks benefits and     alternatives of the procedure were thoroughly explained, informed     consent was obtained.  Digital rectal exam was performed and     revealed no abnormalities.   The LB CF-[id number] endoscope     was introduced through the anus and advanced to the cecum, which     was identified by both the appendix and ileocecal valve, limited     by a redundant colon.    The quality of the prep was adequate,     using MoviPrep.  The instrument was then slowly withdrawn as the     colon was fully examined.     <<PROCEDUREIMAGES>>           FINDINGS:  Severe diverticulosis was found. GRADE 4 SEVERE TICS OF     ENTIRE COLON.STENOSIS AND DEFORMITY OF SIGMOID.RED,THICKENED     HAUSTRAL FOLDS.VERY HARD COLONOSCOPY EXAM.  No polyps or cancers     were seen.  no active bleeding or blood in c.   Retroflexed views     in the rectum revealed no abnormalities.    The scope was then     withdrawn from the patient and the procedure completed.           COMPLICATIONS:  None     ENDOSCOPIC IMPRESSION:     1) Severe diverticulosis     2) No polyps or cancers     3) No active bleeding or blood in c     RECOMMENDATIONS:  1) high fiber diet     F/U EXAM ONLY PRN.     REPEAT EXAM:  No           ______________________________     Vania Rea. Jarold Motto, MD, Clement[facility] Graham           CC:  Lilyan Punt, MD           n.     Rosalie DoctorMarland Kitchen   Vania Rea. Wendi Lastra at 07/05/2009 03:01 PM           [place], [place], [phone]  Note: An exclamation mark (!) indicates a result that was not dispersed into the flowsheet. Document Creation Date: 07/06/2009 5:07 PM _______________________________________________________________________  (1) Order result status: Final Collection or observation date-time: 07/05/2009 14:55 Requested date-time:  Receipt date-time:  Reported date-time:  Referring Physician:  Ordering Physician: Sheryn Bison [phone]) Specimen Source:  Source: Launa Grill Order Number: [id number] Lab site:

## 2010-03-20 NOTE — Assessment & Plan Note (Signed)
Summary: bloating--ch.   History of Present Illness Visit Type: Follow-up Visit Primary GI MD: Sheryn Bison MD FACP FAGA Primary Provider: Simone Curia, MD Requesting Provider: n/a Chief Complaint: Bloating and having trouble with digesting food History of Present Illness:   Very Nice pleasant 77 year old white female with chronic IBS, constipation predominant, also with suspected chronic pancreatitis, chronic gas and bloating probably related to malabsorption of excessive use of sorbitol and fructose, who now presents with worsening gas and bloating and occasional crampy lower abdominal pain. She takes Amitiza 24 micrograms twice a day, Creon 6000 units 3 times a day, Nexium 40 mg a day, VESIcare 5 mg a day which causes dry mouth syndrome, and fiber supplements. She also is on nasal B12 replacement therapy. She is up-to-date on her colonoscopy and endoscopy exams. Despite all his complaints she's had no anorexia, weight loss, or specific hepatobiliary problems. I explored uterine chart in detail and cannot see a previous exam for celiac disease. She says all her symptoms are worse with" white bread and potatoes."   GI Review of Systems    Reports bloating.      Denies abdominal pain, acid reflux, belching, chest pain, dysphagia with liquids, dysphagia with solids, heartburn, loss of appetite, nausea, vomiting, vomiting blood, weight loss, and  weight gain.        Denies anal fissure, black tarry stools, change in bowel habit, constipation, diarrhea, diverticulosis, fecal incontinence, heme positive stool, hemorrhoids, irritable bowel syndrome, jaundice, light color stool, liver problems, rectal bleeding, and  rectal pain.    Current Medications (verified): 1)  Creon 6000 Unit Cpep (Pancrelipase (Lip-Prot-Amyl)) .Marland Kitchen.. 1 Tablet By Mouth Once Daily 2)  Starlix 120 Mg Tabs (Nateglinide) .Marland Kitchen.. 1 Tablet By Mouth Once Daily 3)  Glucophage 500 Mg Tabs (Metformin Hcl) .Marland Kitchen.. 1 Table By Mouth Two  Times A Day 4)  Diovan 80 Mg Tabs (Valsartan) .Marland Kitchen.. 1 Tablet By Mouth Once Daily 5)  Zetia 10 Mg Tabs (Ezetimibe) .Marland Kitchen.. 1 Tablet By Mouth Once Daily 6)  Nexium 40 Mg Cpdr (Esomeprazole Magnesium) .Marland Kitchen.. 1 Tablet By Mouth Once Daily 7)  Vesicare 5 Mg Tabs (Solifenacin Succinate) .Marland Kitchen.. 1 Tablet By Mouth Once Daily 8)  Urelle 81 Mg Tabs (Meth-Hyo-M Bl-Na Phos-Ph Sal) .Marland Kitchen.. 1 Tablet By Mouth Three Times A Day 9)  Amitiza 24 Mcg Caps (Lubiprostone) .Marland Kitchen.. 1 Tablet By Mouth Two Times A Day 10)  Midrin 325-65-100 Mg Caps (Apap-Isometheptene-Dichloral) .... As Needed For Migrane 11)  Antivert 25 Mg Tabs (Meclizine Hcl) .... As Needed For Dizziness 12)  Fibercon 625 Mg  Tabs (Calcium Polycarbophil) .... 2 Tablets in Morning, 1 At Bedtime 13)  Ester-C 500-60 Mg Tabs (Vitamin Mixture) .Marland Kitchen.. 1 Tablet By Mouth Three Times A Week 14)  Calcium Carbonate-Vitamin D 600-400 Mg-Unit  Tabs (Calcium Carbonate-Vitamin D) .... Once Daily 15)  Dukes Magic Mouthwash .... Swish and Swallow 5 Cc's Three Times A Day As Needed 16)  Nasonex 50 Mcg/act Susp (Mometasone Furoate) .... Once Daily 17)  Gi Cocktail- Equal Parts Maalox, Donnatal, Xylocaine .Marland Kitchen.. 1 Tbsp Q 6-8 Hrs As Needed  Allergies (verified): 1)  ! Sulfa  Past History:  Past medical, surgical, family and social histories (including risk factors) reviewed for relevance to current acute and chronic problems.  Past Medical History: Reviewed history from 03/01/2008 and no changes required. Current Problems:  DM (ICD-250.00) Hx of DYSPHAGIA, PHARYNGOESOPHAGEAL PHASE (ICD-787.24) ACID REFLUX DISEASE (ICD-530.81) CONSTIPATION (ICD-564.00) COLONIC POLYPS, ADENOMATOUS (ICD-211.3) HEMORRHOIDS, INTERNAL (ICD-455.0) HIATAL HERNIA (ICD-553.3) ESOPHAGEAL STRICTURE (  ICD-530.3) GASTRITIS (ICD-535.50) DIVERTICULOSIS, COLON (ICD-562.10)    Past Surgical History: Reviewed history from 11/22/2008 and no changes required. D&C (miscarriage) Cholecystectomy  1990 Hysterectromy 1976 Rectocele repair Breast-Lumpectomy Knee Replacement Foot surgery  Family History: Reviewed history from 03/01/2008 and no changes required. Family History of Ovarian Cancer: Mother Family History of Breast Cancer:Sister Family History of Liver Cancer:Sister Family History of Colon Cancer: 1st cousin Family History of Diabetes: Maternal Aunt and Uncle, Father Family History of Liver Disease/Cirrhosis: 1st cousin  Social History: Reviewed history from 03/01/2008 and no changes required. Occupation: Retired Geologist, engineering Patient has never smoked.  Alcohol Use - no Illicit Drug Use - no Patient gets regular exercise.  Review of Systems       The patient complains of arthritis/joint pain and back pain.  The patient denies allergy/sinus, anemia, anxiety-new, blood in urine, breast changes/lumps, change in vision, confusion, cough, coughing up blood, depression-new, fainting, fatigue, fever, headaches-new, hearing problems, heart murmur, heart rhythm changes, itching, menstrual pain, muscle pains/cramps, night sweats, nosebleeds, pregnancy symptoms, shortness of breath, skin rash, sleeping problems, sore throat, swelling of feet/legs, swollen lymph glands, thirst - excessive , urination - excessive , urination changes/pain, urine leakage, vision changes, and voice change.    Vital Signs:  Patient profile:   77 year old female Height:      64 inches Weight:      167 pounds BMI:     28.77 BSA:     1.81 Pulse rate:   72 / minute Pulse rhythm:   regular BP sitting:   116 / 64  (left arm) Cuff size:   regular  Vitals Entered By: Ok Anis CMA (April 28, 2009 10:29 AM)  Physical Exam  General:  Well developed, well nourished, no acute distress.healthy appearing.   Head:  Normocephalic and atraumatic. Eyes:  PERRLA, no icterus. Mouth:  No deformity or lesions, dentition normal. Neck:  Supple; no masses or thyromegaly. Lungs:  Clear throughout to  auscultation. Heart:  Regular rate and rhythm; no murmurs, rubs,  or bruits. Abdomen:  Soft, nontender and nondistended. No masses, hepatosplenomegaly or hernias noted. Normal bowel sounds. Msk:  Symmetrical with no gross deformities. Normal posture. Extremities:  No clubbing, cyanosis, edema or deformities noted. Neurologic:  Alert and  oriented x4;  grossly normal neurologically. Cervical Nodes:  No significant cervical adenopathy. Inguinal Nodes:  No significant inguinal adenopathy. Psych:  Alert and cooperative. Normal mood and affect.   Impression & Recommendations:  Problem # 1:  CHRONIC PANCREATITIS (ICD-577.1) Assessment Improved Suspected idiopathic chronic pancreatitis with possible associated bacterial overgrowth syndrome. I have added [facility] tabs twice a day as a probiotic and extra fiber. Celiac panel has been ordered. She also has treated B12 deficiency perhaps related to these conditions. Repeat B12 levels have been ordered.  Problem # 2:  ESOPHAGEAL MOTILITY DISORDER (ICD-530.5) Assessment: Improved She Has an esophageal motility disorder managed with p.r.n. antacids as moderate therapy.  Problem # 3:  CHRONIC INTERSTITIAL CYSTITIS (ICD-595.1) Assessment: Improved continue followup with Dr. Darvin Neighbours as needed. She is on daily VESIcare which I think causes a lot of her dry mouth syndrome problems. There's been no evidence of Sjogren's syndrome. I have asked her to discontinue use of mints with sorbitol/ fructose which may be exacerbating her gas and bloating problems.  Problem # 4:  DM (ICD-250.00) Assessment: Improved Continue oral medications as per Dr. Rande Lawman.  Problem # 5:  ACID REFLUX DISEASE (ICD-530.81) Assessment: Improved continue reflex regime and  daily Nexium 40 mg.  Problem # 6:  CONSTIPATION (ICD-564.00) Assessment: Improved  continueAmitiza 24 micrograms twice a day as tolerated. Orders: TLB-B12 + Folate Pnl  ([id number]-U98/JXB) TLB-Ferritin (82728-FER) TLB-IBC Pnl (Iron/FE;Transferrin) (83550-IBC) T-Sprue Panel (Celiac Disease Aby Eval) (83516x3/[address])  Patient Instructions: 1)  Copy sent to : Dr. Darvin Neighbours in urology and Dr. Simone Curia and primary care 2)  Please continue current medications.  3)  [facility] pills b.i.d. 4)  Labs pending 5)  Please schedule a follow-up appointment as needed.  6)  Avoid foods high in acid content ( tomatoes, citrus juices, spicy foods) . Avoid eating within 3 to 4 hours of lying down or before exercising. Do not over eat; try smaller more frequent meals. Elevate head of bed four inches when sleeping.  7)  Diet should be high in fiber ( fruits, vegetables, whole grains) but low in residue. Drink at least eight (8) glasses of water a day.  8)  The medication list was reviewed and reconciled.  All changed / newly prescribed medications were explained.  A complete medication list was provided to the patient / caregiver.  Appended Document: bloating--ch.    Clinical Lists Changes  Medications: Added new medication of [facility]  CAPS (PROBIOTIC PRODUCT) two times a day

## 2010-04-20 ENCOUNTER — Ambulatory Visit (INDEPENDENT_AMBULATORY_CARE_PROVIDER_SITE_OTHER): Payer: Medicare Other | Admitting: Urology

## 2010-04-20 DIAGNOSIS — N3010 Interstitial cystitis (chronic) without hematuria: Secondary | ICD-10-CM

## 2010-05-01 ENCOUNTER — Emergency Department (HOSPITAL_COMMUNITY): Payer: Medicare Other

## 2010-05-01 ENCOUNTER — Emergency Department (HOSPITAL_COMMUNITY)
Admission: EM | Admit: 2010-05-01 | Discharge: 2010-05-01 | Disposition: A | Discharge status: Home / Self Care | Payer: Medicare Other | Attending: General Surgery | Admitting: General Surgery

## 2010-05-01 DIAGNOSIS — R079 Chest pain, unspecified: Secondary | ICD-10-CM | POA: Insufficient documentation

## 2010-05-01 DIAGNOSIS — E785 Hyperlipidemia, unspecified: Secondary | ICD-10-CM | POA: Insufficient documentation

## 2010-05-07 LAB — GLUCOSE, CAPILLARY
Glucose-Capillary: 120 mg/dL — ABNORMAL HIGH (ref 70–99)
Glucose-Capillary: 162 mg/dL — ABNORMAL HIGH (ref 70–99)

## 2010-05-13 ENCOUNTER — Emergency Department (HOSPITAL_COMMUNITY)
Admission: EM | Admit: 2010-05-13 | Discharge: 2010-05-13 | Disposition: A | Discharge status: Home / Self Care | Payer: Medicare Other | Attending: Emergency Medicine | Admitting: Emergency Medicine

## 2010-05-13 ENCOUNTER — Emergency Department (HOSPITAL_COMMUNITY): Payer: Medicare Other

## 2010-05-13 DIAGNOSIS — S0990XA Unspecified injury of head, initial encounter: Secondary | ICD-10-CM | POA: Insufficient documentation

## 2010-05-13 DIAGNOSIS — W1809XA Striking against other object with subsequent fall, initial encounter: Secondary | ICD-10-CM | POA: Insufficient documentation

## 2010-05-13 DIAGNOSIS — Y92009 Unspecified place in unspecified non-institutional (private) residence as the place of occurrence of the external cause: Secondary | ICD-10-CM | POA: Insufficient documentation

## 2010-05-18 ENCOUNTER — Ambulatory Visit (INDEPENDENT_AMBULATORY_CARE_PROVIDER_SITE_OTHER): Payer: Medicare Other | Admitting: Urology

## 2010-05-18 DIAGNOSIS — N3020 Other chronic cystitis without hematuria: Secondary | ICD-10-CM

## 2010-05-24 ENCOUNTER — Ambulatory Visit: Payer: No Typology Code available for payment source | Admitting: Gastroenterology

## 2010-05-31 LAB — GLUCOSE, CAPILLARY
Glucose-Capillary: 141 mg/dL — ABNORMAL HIGH (ref 70–99)
Glucose-Capillary: 156 mg/dL — ABNORMAL HIGH (ref 70–99)
Glucose-Capillary: 121 mg/dL — ABNORMAL HIGH (ref 70–99)
Glucose-Capillary: 122 mg/dL — ABNORMAL HIGH (ref 70–99)
Glucose-Capillary: 127 mg/dL — ABNORMAL HIGH (ref 70–99)
Glucose-Capillary: 130 mg/dL — ABNORMAL HIGH (ref 70–99)
Glucose-Capillary: 134 mg/dL — ABNORMAL HIGH (ref 70–99)
Glucose-Capillary: 141 mg/dL — ABNORMAL HIGH (ref 70–99)
Glucose-Capillary: 143 mg/dL — ABNORMAL HIGH (ref 70–99)
Glucose-Capillary: 144 mg/dL — ABNORMAL HIGH (ref 70–99)
Glucose-Capillary: 152 mg/dL — ABNORMAL HIGH (ref 70–99)
Glucose-Capillary: 154 mg/dL — ABNORMAL HIGH (ref 70–99)
Glucose-Capillary: 158 mg/dL — ABNORMAL HIGH (ref 70–99)
Glucose-Capillary: 164 mg/dL — ABNORMAL HIGH (ref 70–99)
Glucose-Capillary: 167 mg/dL — ABNORMAL HIGH (ref 70–99)
Glucose-Capillary: 200 mg/dL — ABNORMAL HIGH (ref 70–99)

## 2010-06-05 ENCOUNTER — Ambulatory Visit (INDEPENDENT_AMBULATORY_CARE_PROVIDER_SITE_OTHER): Payer: No Typology Code available for payment source | Admitting: Urology

## 2010-06-05 DIAGNOSIS — N3010 Interstitial cystitis (chronic) without hematuria: Secondary | ICD-10-CM

## 2010-06-05 LAB — URINE CULTURE
Colony Count: 1000
Colony Count: NO GROWTH

## 2010-06-05 LAB — CBC
HCT: 39.8 % (ref 36.0–46.0)
Hemoglobin: 13.5 g/dL (ref 12.0–15.0)
Hemoglobin: 11.6 g/dL — ABNORMAL LOW (ref 12.0–15.0)
Hemoglobin: 13.7 g/dL (ref 12.0–15.0)
MCHC: 34 g/dL (ref 30.0–36.0)
MCHC: 33.8 g/dL (ref 30.0–36.0)
MCV: 91.2 fL (ref 78.0–100.0)
MCV: 89.6 fL (ref 78.0–100.0)
MCV: 90 fL (ref 78.0–100.0)
Platelets: 194 10*3/uL (ref 150–400)
RBC: 4.37 MIL/uL (ref 3.87–5.11)
RBC: 3.8 MIL/uL — ABNORMAL LOW (ref 3.87–5.11)
RBC: 4.37 MIL/uL (ref 3.87–5.11)
RDW: 15 % (ref 11.5–15.5)
RDW: 15.1 % (ref 11.5–15.5)
WBC: 12.5 10*3/uL — ABNORMAL HIGH (ref 4.0–10.5)
WBC: 11.9 10*3/uL — ABNORMAL HIGH (ref 4.0–10.5)
WBC: 12.3 10*3/uL — ABNORMAL HIGH (ref 4.0–10.5)

## 2010-06-05 LAB — GLUCOSE, CAPILLARY
Glucose-Capillary: 108 mg/dL — ABNORMAL HIGH (ref 70–99)
Glucose-Capillary: 118 mg/dL — ABNORMAL HIGH (ref 70–99)
Glucose-Capillary: 131 mg/dL — ABNORMAL HIGH (ref 70–99)
Glucose-Capillary: 131 mg/dL — ABNORMAL HIGH (ref 70–99)
Glucose-Capillary: 136 mg/dL — ABNORMAL HIGH (ref 70–99)
Glucose-Capillary: 136 mg/dL — ABNORMAL HIGH (ref 70–99)
Glucose-Capillary: 137 mg/dL — ABNORMAL HIGH (ref 70–99)
Glucose-Capillary: 145 mg/dL — ABNORMAL HIGH (ref 70–99)
Glucose-Capillary: 148 mg/dL — ABNORMAL HIGH (ref 70–99)
Glucose-Capillary: 149 mg/dL — ABNORMAL HIGH (ref 70–99)
Glucose-Capillary: 154 mg/dL — ABNORMAL HIGH (ref 70–99)
Glucose-Capillary: 157 mg/dL — ABNORMAL HIGH (ref 70–99)
Glucose-Capillary: 161 mg/dL — ABNORMAL HIGH (ref 70–99)
Glucose-Capillary: 164 mg/dL — ABNORMAL HIGH (ref 70–99)
Glucose-Capillary: 164 mg/dL — ABNORMAL HIGH (ref 70–99)
Glucose-Capillary: 168 mg/dL — ABNORMAL HIGH (ref 70–99)
Glucose-Capillary: 173 mg/dL — ABNORMAL HIGH (ref 70–99)
Glucose-Capillary: 175 mg/dL — ABNORMAL HIGH (ref 70–99)
Glucose-Capillary: 184 mg/dL — ABNORMAL HIGH (ref 70–99)
Glucose-Capillary: 190 mg/dL — ABNORMAL HIGH (ref 70–99)
Glucose-Capillary: 221 mg/dL — ABNORMAL HIGH (ref 70–99)

## 2010-06-05 LAB — BASIC METABOLIC PANEL
BUN: 8 mg/dL (ref 6–23)
CO2: 28 mEq/L (ref 19–32)
Chloride: 102 mEq/L (ref 96–112)
Chloride: 99 mEq/L (ref 96–112)
Creatinine, Ser: 0.61 mg/dL (ref 0.4–1.2)
Creatinine, Ser: 0.78 mg/dL (ref 0.4–1.2)
GFR calc Af Amer: >60 mL/min (ref >60)
GFR calc Af Amer: >60 mL/min (ref >60)
GFR calc non Af Amer: >60 mL/min (ref >60)
GFR calc non Af Amer: >60 mL/min (ref >60)
Glucose, Bld: 148 mg/dL — ABNORMAL HIGH (ref 70–99)
Potassium: 4.5 mEq/L (ref 3.5–5.1)
Sodium: 134 mEq/L — ABNORMAL LOW (ref 135–145)

## 2010-06-05 LAB — URINALYSIS, ROUTINE W REFLEX MICROSCOPIC
Bilirubin Urine: NEGATIVE
Bilirubin Urine: NEGATIVE
Glucose, UA: 100 mg/dL — AB
Glucose, UA: >1000 mg/dL — AB
Ketones, ur: 15 mg/dL — AB
Leukocytes, UA: NEGATIVE
Protein, ur: NEGATIVE mg/dL
Protein, ur: 30 mg/dL — AB
Specific Gravity, Urine: 1.016 (ref 1.005–1.030)
Urobilinogen, UA: 0.2 mg/dL (ref 0.0–1.0)
pH: 6 (ref 5.0–8.0)
pH: 6.5 (ref 5.0–8.0)

## 2010-06-05 LAB — URINE MICROSCOPIC-ADD ON

## 2010-06-05 LAB — PROTIME-INR
INR: 1.7 — ABNORMAL HIGH (ref 0.00–1.49)
Prothrombin Time: 12.8 seconds (ref 11.6–15.2)
Prothrombin Time: 19.5 seconds — ABNORMAL HIGH (ref 11.6–15.2)

## 2010-06-05 LAB — DIFFERENTIAL
Basophils Absolute: 0 10*3/uL (ref 0.0–0.1)
Basophils Relative: 0 % (ref 0–1)
Eosinophils Absolute: 0 10*3/uL (ref 0.0–0.7)
Eosinophils Relative: 0 % (ref 0–5)
Neutrophils Relative %: 73 % (ref 43–77)

## 2010-06-05 LAB — COMPREHENSIVE METABOLIC PANEL
ALT: 15 U/L (ref 0–35)
AST: 14 U/L (ref 0–37)
CO2: 24 mEq/L (ref 19–32)
Calcium: 9.5 mg/dL (ref 8.4–10.5)
Chloride: 106 mEq/L (ref 96–112)
GFR calc Af Amer: >60 mL/min (ref >60)
GFR calc non Af Amer: >60 mL/min (ref >60)
Glucose, Bld: 103 mg/dL — ABNORMAL HIGH (ref 70–99)
Sodium: 138 mEq/L (ref 135–145)
Total Bilirubin: 0.7 mg/dL (ref 0.3–1.2)

## 2010-06-05 LAB — LIPASE, BLOOD: Lipase: 15 U/L (ref 11–59)

## 2010-06-05 LAB — VITAMIN D 25 HYDROXY (VIT D DEFICIENCY, FRACTURES): Vit D, 25-Hydroxy: 46 ng/mL (ref 30–89)

## 2010-06-05 LAB — HEMOGLOBIN A1C: Hgb A1c MFr Bld: 6.3 % — ABNORMAL HIGH (ref 4.6–6.1)

## 2010-07-03 ENCOUNTER — Ambulatory Visit (INDEPENDENT_AMBULATORY_CARE_PROVIDER_SITE_OTHER): Payer: No Typology Code available for payment source | Admitting: Urology

## 2010-07-03 DIAGNOSIS — N3010 Interstitial cystitis (chronic) without hematuria: Secondary | ICD-10-CM

## 2010-07-03 NOTE — Op Note (Signed)
NAMESUEELLEN, KAYES            ACCOUNT NO.:  [id number]   MEDICAL RECORD NO.:  [id number]          PATIENT TYPE:  INP   LOCATION:  5004                         FACILITY:  [facility]   PHYSICIAN:  Claude Manges. Whitfield, M.D.DATE OF BIRTH:  09-24-1933   DATE OF PROCEDURE:  04/05/2008  DATE OF DISCHARGE:                               OPERATIVE REPORT   PREOPERATIVE DIAGNOSIS:  End-stage osteoarthritis right knee.   POSTOPERATIVE DIAGNOSIS:  End-stage osteoarthritis right knee.   PROCEDURE:  Right total knee replacement.   SURGEON:  Claude Manges. Cleophas Dunker, MD   ASSISTANT:  Oris Drone. Petrarca, PA-C   ANESTHESIA:  General orotracheal with supplemental femoral nerve block.   COMPLICATIONS:  None.   COMPONENTS:  DePuy LCS standard femoral and #3 rotating keeled tibial  tray with a 10-mm bridging bearing, a 3 peg metal-back rotating patella  all was secured with polymethyl methacrylate.   Mr. Santiago Bumpers was present throughout the operative case and was  instrumental in the success based on his experience   PROCEDURE:  The patient comfortable on the operating table.  Under  general orotracheal anesthesia, nursing staff inserted a Foley catheter  without difficulty.  Urine was clear.   The right lower extremity then placed in a thigh tourniquet.  The leg  was then prepped with Betadine scrub and DuraPrep from the thigh  tourniquet to the midfoot.  Sterile draping was performed.  With the  extremity still elevated, it was Esmarch exsanguinated with a proximal  tourniquet at 350 mmHg.   A midline longitudinal incision was made and centered about the patella  extending from the superior pouch to tibial tubercle.  Via sharp  dissection, incision was carried down to the subcutaneous tissue.  First  layer of capsule was incised in the midline.  A medial parapatellar  incision was then made with a Bovie.  Joint was then entered.  There was  a small clear joint effusion.  Patella was everted to 180  degrees and  the knee flexed to 90 degrees.  There were moderate-sized osteophytes  noted along the medial and lateral femoral condyle and these were  removed.  There was moderate synovitis and synovectomy was performed.  There was considerable loss of articular cartilage particularly in the  lateral compartment.  There was a valgus deformity preoperatively but it  was easily correctable to neutral.   Retractor was then placed about the knee.  The initial bony cut was then  made transversely on the proximal tibia with a 7-degree angle of  declination.  Subsequent cuts were then made on the femur with a 3-  degree distal femoral valgus cut.  Lamina spreaders were then inserted  on both sides of the joint to remove ACL, PCL and medial and lateral  menisci.  There were no osteophytes noted along the posterior femoral  condyles.  MCL and LCL remained intact throughout the operative  procedure.  10 mm flexion/extension gaps were perfectly symmetrical.  The final femoral cut was then made for the tapered osteotomies   Retractor was then placed about the tibia.  The #3 tibial tray was  applied.  Center hole was made followed by the keeled cut.  The 10-mm  bridging bearing was then applied to the trial tibia followed by the  trial femoral component.  We had full extension, no opening with varus  or valgus stress and no malrotation of the component with  flexion/extension.   The patella was then measured at 22 mm.  We removed 9 mm leaving a  patella thickness of 13.  The tri-pegged jig was then applied followed  by the trial patella.  We then placed the knee through a full range of  motion and patella would not sublux.   The trial components were removed.  Joint was copiously irrigated with  jet saline.   The final components were then impacted.  We initially inserted the  tibial component followed by the 10-mm bridging bearing and then the  femoral component.  Extraneous methacrylate was  removed from their  edges.  The patella was then applied with the methacrylate and a  patellar clamp.  We injected Marcaine with epinephrine along the deep  capsule and used FloSeal waiting for the methacrylate to mature.  At  about 15 minutes, the methacrylate had hardened and the tourniquet was  deflated.  Gross bleeders were Bovie coagulated.  We had a very nice dry  field but good capillary refill to the joint surface.  We lightly  irrigated the joint.   The wound was then closed in layers.  The deep capsule was closed with  interrupted #1 Ethibond, superficial capsule closed with a running 0  Vicryl, the subcu with 2-0 Vicryl, and skin closed with skin clips.  Sterile bulky dressing was applied followed by the patient's support  stocking.   The patient tolerated the procedure without complications.      Claude Manges. Cleophas Dunker, M.D.  Electronically Signed     PWW/MEDQ  D:  04/05/2008  T:  04/06/2008  Job:  [id number]

## 2010-07-03 NOTE — Discharge Summary (Signed)
[name], [name]            ACCOUNT NO.:  [id number]   MEDICAL RECORD NO.:  [id number]          PATIENT TYPE:  INP   LOCATION:  5004                         FACILITY:  [facility]   PHYSICIAN:  Claude Manges. Whitfield, M.D.DATE OF BIRTH:  1934-02-03   DATE OF ADMISSION:  04/05/2008  DATE OF DISCHARGE:  04/08/2008                               DISCHARGE SUMMARY   ADMISSION DIAGNOSIS:  Osteoarthritis of the right knee.   DISCHARGE DIAGNOSES:  1. Osteoarthritis of the right knee.  2. Non-insulin-dependent diabetes mellitus.  3. History of hypertension.  4. History of cystitis.  5. History of fatty liver.  6. History of pancreatitis.  7. Recent urinary tract infection.  8. Gastroesophageal reflux disease.   PROCEDURE:  Right total knee arthroplasty.   HISTORY:  Deborah Gray is a 77 year old white married female with chronic  right knee pain, increasing deformity of the leg.  Pain is mainly  moderate to severe aching pain which is now becoming more constant.  She  is having difficulty with activities of daily living.  Knee swells and  will wake her up at night time.  She has failed conservative treatment.  Indicated now for right total knee arthroplasty.   HOSPITAL COURSE:  A 77 year old white female admitted April 05, 2008  after appropriate laboratory studies were obtained as well as 1 gram  Ancef IV on call to the operating room.  She was taken to the operating  room where she initially had a femoral nerve block and then underwent a  right total knee arthroplasty by Dr. Norlene Campbell assisted by Oris Drone. Petrarca, P.A.-C.  She tolerated the procedure well.  She was  continued on Ancef 1 gram IV q.6h. x3 doses.  PCA Dilaudid pump was used  in a reduced dose protocol.  Glucophage was held on the day of surgery  and was restarted on the following day.  Sliding scale was ordered,  minor sensitivity.  A Foley was placed.  Consults to PT, OT and care  management were made.  She wanted  to go to the [facility] in [place]  for rehab and case management was told about this.  Physical therapy  consult with allowing partial weightbearing 50% of body weight.  She was  allowed out of bed the following day.  She was restarted on her Levaquin  which Dr. Earlene Plater, her urologist, had placed her on preoperatively for  UTI.  This was on February 17.  She was weaned off of her PCA.  She was  allowed out of bed to a chair and did ambulate.  She had difficulty with  pain management, and once her PCA was discontinued she was placed on  Dilaudid 2 mg p.o. 1-2 tabs every 3 hours p.r.n. pain.  She became quite  anxious on the floor on postop day 2 early in the morning, and Valium 5  mg p.o. q.6h. was ordered.  Apparently she had some difficulties with  her walking 1 day and apparently started to fall but was caught, and she  was concerned that she had injured her knee.  X-rays of the right knee  on April 07, 2008 did not reveal any disk changes.  She was  hypokalemic and was replaced with potassium of 20 mEq p.o. b.i.d.  Because of her calf pain a Doppler was performed of the right calf which  was stated to be negative.  Her Robaxin was increased to 750 mg p.o.  q.6h. p.r.n. spasms.  Her Dilaudid was 2 mg 1-2 tabs every 3 hours  p.r.n. pain and an additional 2 mg could be given for breakthrough pain  every 3-4 hours.  Because of her interstitial cystitis she was to have  her Foley left in, and since she was being covered with Levaquin this  was allowed.  Urine was then obtained at time of DC.  Zofran was used 4  mg p.o. q.6h.  p.r.n. nausea.  The remainder of her hospital course was  essentially unremarkable and she was planned for discharge today on  April 08, 2008.   LABORATORY STUDIES:  Admitted with hemoglobin 13.5, hematocrit 39.8%,  white count 12,500, platelets 264,000.  Pro time was 12.8, INR 1, PTT  was 25.  Sodium preop 138, potassium 4.1, chloride 106, CO2 of 24,   glucose 103, BUN 20, creatinine 0.80, GFR greater than 60, bilirubin  0.7, alk phos 59, SGOT 14, SGPT 15, total protein 6.7, albumin 3.7,  calcium 9.5, amylase 134, lipase 15. T4 was 7.8, TSH 0.998, and T3 was  75.2.  Her 25 hydroxy vitamin D level was 46.  Urinalysis preop revealed  green clear, specific gravity 1.016, pH 6.5, no glucose, negative  bilirubin or ketones.  Negative protein, trace of blood.  She had  moderate squamous cells, WBCs were too numerous to count, red cells 3-6  many bacteria.  Urine culture showed 1000 colonies per mL insignificant  growth.  Remainder of laboratory studies revealed a discharge hemoglobin  13.7, hematocrit 39.8%, white count 11,900, platelets 203,000.  Pro time  at discharge 21, INR 1.7.  Her BMET at discharge sodium 134, potassium  4.5, chloride 100, CO2 of 28, glucose 148, BUN 14, creatinine 0.84, GFR  greater than 60 and calcium was 9.  Urine from April 07, 2008  revealed 3-6 whites, 7-10 reds, and rare bacteria.  Cultures were  pending at that time.   Radiographic studies revealed no complications after right total knee  arthroplasty.  Chest x-ray of April 01, 2008 revealed heart size and  mediastinal contours within normal limits.  Both lungs were clear.  Visualized skeletal structures were unremarkable.   EKG of March 30, 2008 revealed normal sinus rhythm.   Right knee radiographic study of April 07, 2008 revealed a no  complications after right knee arthroplasty.  Hardware components are in  anatomic alignment.   DISCHARGE INSTRUCTIONS:  Diet should be that of 60 grams carb modified  because were diabetes.  She will increase her activity slowly.  She is  to use a walker 50% weightbearing on the right knee and physical  therapy.  CPM 0-60 degrees for 6-8 hours per day and increased by 5-10  degrees per day.  No lifting or driving for 6 weeks.  She may shower on  April 17, 2008.  Left the water run over the incision but do  not  scrub the incision.  May clean the incision with alcohol and may paint  with Betadine prior to daily dressing change.   Medications to include Levaquin 500 mg as prescribed by Dr. Earlene Plater.  Other medications to include Coumadin as per pharmacy protocol, keeping  her INR between 2 and 3.  FiberCon 625 mg tablets at 10:00 a.m. and 2200  p.m. Amitiza 24 mcg caplets p.o. b.i.d.  Creon cap t.i.d. with meals.  Starlix 120 mg given prior to or with meal t.i.d. Diovan 80 mg 1 p.o.  daily.  Zetia 10 mg at 1800. Allegra 180 mg p.o. daily.  Nasonex 1 spray  each nostril daily.  Nexium 40 mg b.i.d.  Reglan 10 mg p.o. daily at  8:00 a.m. Oxytrol 3.9 mg for 24-hour patch at 10:00 a.m. daily, Vesicare  10 mg p.o. daily. Colace 100 mg p.o. b.i.d. Metformin 500 mg b.i.d. 8  a.m. and 5 p.m. Lovenox 30 mg subcu q.12h. until INR is greater than  1.9. Antivert 25 mg q.12h. p.r.n., Midrin 1 capsule q.4h. p.r.n. Robaxin  750 mg p.o. q.6h. p.r.n. spasms. Urised tablet t.i.d. p.r.n. Zofran 2 mg  1-2 q.3h. p.r.n. pain with 2 mg p.o. q.3-4h. p.r.n. breakthrough pain.  Further medicines per her family doctor.   She is to do physical therapy for at least b.i.d. and CPM for a minimum  of 6-8 hours per day.  She will need to have follow up back in our  office with Dr. Cleophas Dunker on April 18, 2008.  She was discharged in  improved condition.      Oris Drone Petrarca, P.A.-C.      Claude Manges. Cleophas Dunker, M.D.  Electronically Signed    BDP/MEDQ  D:  04/08/2008  T:  04/08/2008  Job:  [id number]

## 2010-07-06 NOTE — Procedures (Signed)
Deborah Gray            ACCOUNT NO.:  [id number]   MEDICAL RECORD NO.:  [id number]           PATIENT TYPE:   LOCATION:                                 FACILITY:   PHYSICIAN:  Donna Bernard, M.D.     DATE OF BIRTH:   DATE OF PROCEDURE:  05/03/2004  DATE OF DISCHARGE:                                    STRESS TEST   INDICATION FOR TEST:  This patient is a 28ish-year-old white female with  history of type 2 diabetes along with hypertension and a strong family  history of coronary artery disease who presented to the office earlier in  the week with some atypical symptoms including sudden weakness, nausea,  diaphoresis, left arm aching.  This occurred while she was rushing about the  house doing some clean up work.  She mentions one other spell similar to  this, but not quite so much.  An EKG in the office showed only nonspecific  ST-T changes.  With the patient's strong family history of coronary artery  disease along with her significant risk factors along with the fact that she  is a woman and therefore prone to atypical symptoms and along with the fact  that diabetes has a significant incidence of ischemia without classic  symptoms, a test is done for risk stratification purposes.   Stress test was performed with standard Bruce protocol.  Resting EKG  revealed normal sinus rhythm, 70 beats per minute.  There were no  significant ST segment changes noted at this point.  The patient tolerated  the first stage well.  She had a hypertensive response as expected.  Quickly  into the second stage her heart rate surpassed her submaxed predicted heart  rate of 129.  She eventually achieved the maximum heart rate of 148.  At  this point there was ST segment depression, however, not enough to fit  electrocardiographic criteria for ischemia.  Leads 2 and leads aVF had a 0.9  mm depression compared to baseline.  However, at 0.08 seconds past the J  point the slope was nicely  ascending.  Leads V4-V6 had at most a 0.5 mm  depression with a nicely ascending slope.   IMPRESSION:  Negative adequate stress per electrocardiographic criteria.   PLAN:  Patient is advised that the Cardiolite portion which will be done  over the next few days through nuclear medicine will be the most crucial  part of her results.  If this is normal will reassure the patient that she  can continue her exercise regimen and will need no further work-up.  If this  is positive this will warrant referral.  Patient understands this.      WSL/MEDQ  D:  05/03/2004  T:  05/03/2004  Job:  [id number]

## 2010-08-03 ENCOUNTER — Ambulatory Visit (INDEPENDENT_AMBULATORY_CARE_PROVIDER_SITE_OTHER): Payer: Medicare Other | Admitting: Urology

## 2010-08-03 DIAGNOSIS — N3010 Interstitial cystitis (chronic) without hematuria: Secondary | ICD-10-CM

## 2010-08-27 ENCOUNTER — Other Ambulatory Visit: Payer: Self-pay | Admitting: Family Medicine

## 2010-08-27 DIAGNOSIS — Z139 Encounter for screening, unspecified: Secondary | ICD-10-CM

## 2010-09-07 ENCOUNTER — Ambulatory Visit (INDEPENDENT_AMBULATORY_CARE_PROVIDER_SITE_OTHER): Payer: No Typology Code available for payment source | Admitting: Urology

## 2010-09-07 DIAGNOSIS — N3010 Interstitial cystitis (chronic) without hematuria: Secondary | ICD-10-CM

## 2010-09-14 ENCOUNTER — Ambulatory Visit (HOSPITAL_COMMUNITY)
Admission: RE | Admit: 2010-09-14 | Discharge: 2010-09-14 | Disposition: A | Discharge status: Home / Self Care | Payer: Medicare Other | Source: Ambulatory Visit | Attending: Family Medicine | Admitting: Family Medicine

## 2010-09-14 DIAGNOSIS — Z1231 Encounter for screening mammogram for malignant neoplasm of breast: Secondary | ICD-10-CM | POA: Insufficient documentation

## 2010-09-14 DIAGNOSIS — Z139 Encounter for screening, unspecified: Secondary | ICD-10-CM

## 2010-10-05 ENCOUNTER — Ambulatory Visit (INDEPENDENT_AMBULATORY_CARE_PROVIDER_SITE_OTHER): Payer: Medicare Other | Admitting: Urology

## 2010-10-05 DIAGNOSIS — N3010 Interstitial cystitis (chronic) without hematuria: Secondary | ICD-10-CM

## 2010-10-30 ENCOUNTER — Ambulatory Visit (INDEPENDENT_AMBULATORY_CARE_PROVIDER_SITE_OTHER): Payer: Medicare Other | Admitting: Urology

## 2010-10-30 DIAGNOSIS — N3010 Interstitial cystitis (chronic) without hematuria: Secondary | ICD-10-CM

## 2010-11-29 ENCOUNTER — Encounter: Payer: Self-pay | Admitting: Gastroenterology

## 2010-11-29 ENCOUNTER — Other Ambulatory Visit (INDEPENDENT_AMBULATORY_CARE_PROVIDER_SITE_OTHER): Payer: Medicare Other

## 2010-11-29 ENCOUNTER — Ambulatory Visit (INDEPENDENT_AMBULATORY_CARE_PROVIDER_SITE_OTHER): Payer: Medicare Other | Admitting: Gastroenterology

## 2010-11-29 DIAGNOSIS — K5900 Constipation, unspecified: Secondary | ICD-10-CM | POA: Insufficient documentation

## 2010-11-29 DIAGNOSIS — R109 Unspecified abdominal pain: Secondary | ICD-10-CM | POA: Insufficient documentation

## 2010-11-29 DIAGNOSIS — K861 Other chronic pancreatitis: Secondary | ICD-10-CM

## 2010-11-29 DIAGNOSIS — R12 Heartburn: Secondary | ICD-10-CM | POA: Insufficient documentation

## 2010-11-29 DIAGNOSIS — K224 Dyskinesia of esophagus: Secondary | ICD-10-CM | POA: Insufficient documentation

## 2010-11-29 DIAGNOSIS — M1990 Unspecified osteoarthritis, unspecified site: Secondary | ICD-10-CM | POA: Insufficient documentation

## 2010-11-29 DIAGNOSIS — K5730 Diverticulosis of large intestine without perforation or abscess without bleeding: Secondary | ICD-10-CM

## 2010-11-29 LAB — CBC WITH DIFFERENTIAL/PLATELET
Basophils Relative: 0.2 % (ref 0.0–3.0)
Eosinophils Absolute: 0.1 10*3/uL (ref 0.0–0.7)
Eosinophils Relative: 1.3 % (ref 0.0–5.0)
HCT: 38.7 % (ref 36.0–46.0)
Lymphs Abs: 2.4 10*3/uL (ref 0.7–4.0)
MCHC: 33.3 g/dL (ref 30.0–36.0)
MCV: 86.4 fl (ref 78.0–100.0)
Monocytes Absolute: 0.6 10*3/uL (ref 0.1–1.0)
Neutrophils Relative %: 59.8 % (ref 43.0–77.0)
RBC: 4.48 Mil/uL (ref 3.87–5.11)
WBC: 7.6 10*3/uL (ref 4.5–10.5)

## 2010-11-29 LAB — BASIC METABOLIC PANEL
BUN: 20 mg/dL (ref 6–23)
CO2: 29 mEq/L (ref 19–32)
Chloride: 104 mEq/L (ref 96–112)
Creatinine, Ser: 0.6 mg/dL (ref 0.4–1.2)
Potassium: 4.7 mEq/L (ref 3.5–5.1)

## 2010-11-29 LAB — HEPATIC FUNCTION PANEL
Alkaline Phosphatase: 55 U/L (ref 39–117)
Bilirubin, Direct: 0 mg/dL (ref 0.0–0.3)

## 2010-11-29 LAB — LIPASE: Lipase: 20 U/L (ref 11.0–59.0)

## 2010-11-29 MED ORDER — TRAMADOL HCL 50 MG PO TABS: 50.0000 mg | ORAL_TABLET | Freq: Three times a day (TID) | ORAL | Status: AC | PRN

## 2010-11-29 NOTE — Patient Instructions (Addendum)
Your prescription for Tramadol has been sent to your pharmacy   You have been scheduled for a CT scan of the abdomen and pelvis at [facility] ([address]---this is in the same building as Architectural [facility]).   You are scheduled on 12/03/10  at 9 am You should arrive 15 minutes prior to your appointment time for registration. Please follow the written instructions below on the day of your exam:  WARNING: IF YOU ARE ALLERGIC TO IODINE/X-RAY DYE, PLEASE NOTIFY RADIOLOGY IMMEDIATELY AT [phone]! YOU WILL BE GIVEN A 13 HOUR PREMEDICATION PREP.  1) Do not eat or drink anything after 5 am (4 hours prior to your test) 2) You have been given 2 bottles of oral contrast to drink. The solution may taste better if refrigerated, but do NOT add ice or any other liquid to this solution. Shake well before drinking.    Drink 1 bottle of contrast @ 7 am  (2 hours prior to your exam)  Drink 1 bottle of contrast @ 8 am (1 hour prior to your exam)  You may take any medications as prescribed with a small amount of water except for the following: Metformin, Glucophage, Glucovance, Avandamet, Riomet, Fortamet, Actoplus Met, Janumet, Glumetza or Metaglip. The above medications must be held the day of the exam AND 48 hours after the exam.  The purpose of you drinking the oral contrast is to aid in the visualization of your intestinal tract. The contrast solution may cause some diarrhea. Before your exam is started, you will be given a small amount of fluid to drink. Depending on your individual set of symptoms, you may also receive an intravenous injection of x-ray contrast/dye. Plan on being at [facility] for 30 minutes or long, depending on the type of exam you are having performed.  If you have any questions regarding your exam or if you need to reschedule, you may call the CT department at [phone] between the hours of 8:00 am and 5:00 pm,  Monday-Friday.  ________________________________________________________________________

## 2010-11-29 NOTE — Progress Notes (Signed)
This is a very nice 77 year old Caucasian female with chronic pancreatitis and relapsing epigastric abdominal pain. She is chronically on Nexium 40 mg a day, ER and Mylanta, and for constipation Amitiza 24 mcg twice a day. She also takes fiber supplements or her diverticulosis coli and chronic constipation. She now complains of intermittent rather marked epigastric pain without pursue potato or alleviating elements. She does give a history of some NSAID use. She has rather severe degenerative arthritis of her knees and back. She is chronically on Creon 2 tablets with meals 3 times a day. Review of her radiographs does not show her CT scan within the last several years. Patient denies any specific hepatobiliary complaints. 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 She currently denies reflux symptoms or dysphagia or significant chest pain or any history of cardiovascular or peripheral vascular problems. Despite all the above complaints her appetite is good and her weight is stable. He is status post cholecystectomy in 1990 by Dr. Antionette Char.  Current Medications, Allergies, Past Medical History, Past Surgical History, Family History and Social History were reviewed in [employer] record.  Pertinent Review of Systems Negative... mostly arthritic type complaints. No specific cardiovascular, pulmonary, or urinary problems at this time.   Physical Exam: Elderly lady with multiple GI complaints and has been doing fairly well until the last several weeks. I suspect she is having a flare of her chronic pancreatitis, and have ordered labs including amylase and lipase and liver profile. CT scan of the abdomen has been ordered for review. I have asked her to stop the NSAIDs and use tramadol 50 mg every 6-8 hours as needed for pain and for arthritis. Otherwise she is to continue her Nexium and other medications including Creon 2 tablets 3 times a day with meals.  She also has a history of B12 deficiency and takes oral vitamin B12.    Assessment and Plan: Encounter Diagnoses  Name Primary?  . Abdominal pain Yes  . Heartburn   . Abdominal pain, unspecified site   . Constipation

## 2010-12-03 ENCOUNTER — Telehealth: Payer: Self-pay | Admitting: *Deleted

## 2010-12-03 ENCOUNTER — Ambulatory Visit (INDEPENDENT_AMBULATORY_CARE_PROVIDER_SITE_OTHER)
Admission: RE | Admit: 2010-12-03 | Discharge: 2010-12-03 | Disposition: A | Discharge status: Home / Self Care | Payer: Medicare Other | Source: Ambulatory Visit | Attending: Gastroenterology | Admitting: Gastroenterology

## 2010-12-03 DIAGNOSIS — R12 Heartburn: Secondary | ICD-10-CM

## 2010-12-03 DIAGNOSIS — K5900 Constipation, unspecified: Secondary | ICD-10-CM

## 2010-12-03 DIAGNOSIS — R109 Unspecified abdominal pain: Secondary | ICD-10-CM

## 2010-12-03 MED ORDER — IOHEXOL 300 MG/ML  SOLN
100.0000 mL | Freq: Once | INTRAMUSCULAR | Status: AC | PRN
  Administered 2010-12-03: 100 mL via INTRAVENOUS

## 2010-12-03 MED ORDER — LUBIPROSTONE 8 MCG PO CAPS: 8.0000 ug | ORAL_CAPSULE | Freq: Two times a day (BID) | ORAL | Status: AC

## 2010-12-03 MED ORDER — POLYETHYLENE GLYCOL 3350 17 GM/SCOOP PO POWD: ORAL | Status: DC

## 2010-12-03 NOTE — Telephone Encounter (Signed)
AMITIZA BID AND QHS MIRALAX.Marland KitchenMarland Kitchen

## 2010-12-03 NOTE — Telephone Encounter (Signed)
See previous note dated 12/03/10. Rx sent to mail in pharmacy.

## 2010-12-03 NOTE — Telephone Encounter (Signed)
Message copied by Richardson Chiquito on Mon Dec 03, 2010  1:45 PM ------      Message from: Mardella Layman      Created: Mon Dec 03, 2010 11:58 AM       CT SCAN IS OK..[facility] RX FOR CONSTIPATION.

## 2010-12-03 NOTE — Telephone Encounter (Signed)
Patient advised that CT showed no acute abnormalities but did show probable constipation. I asked that she continue Amitiza for her constipation. Patient states that she never got samples or rx for this. Do you want me to send her Amitiza 24 mcg 1 capsule bid with meals or is there something else you would like her to be on?

## 2010-12-04 NOTE — Telephone Encounter (Signed)
Advised patient of Dr Norval Gable recommendations and advised that prescriptions have been sent to the patient's pharmacy. She verbalizes understanding.

## 2010-12-07 ENCOUNTER — Ambulatory Visit (INDEPENDENT_AMBULATORY_CARE_PROVIDER_SITE_OTHER): Payer: Medicare Other | Admitting: Urology

## 2010-12-07 DIAGNOSIS — N3010 Interstitial cystitis (chronic) without hematuria: Secondary | ICD-10-CM

## 2011-01-04 ENCOUNTER — Ambulatory Visit (INDEPENDENT_AMBULATORY_CARE_PROVIDER_SITE_OTHER): Payer: Medicare Other | Admitting: Urology

## 2011-01-04 DIAGNOSIS — N3010 Interstitial cystitis (chronic) without hematuria: Secondary | ICD-10-CM

## 2011-01-18 ENCOUNTER — Other Ambulatory Visit: Payer: Self-pay | Admitting: Gastroenterology

## 2011-01-18 ENCOUNTER — Encounter: Payer: Self-pay | Admitting: Gastroenterology

## 2011-01-18 ENCOUNTER — Ambulatory Visit (INDEPENDENT_AMBULATORY_CARE_PROVIDER_SITE_OTHER): Payer: Medicare Other | Admitting: Gastroenterology

## 2011-01-18 DIAGNOSIS — K6289 Other specified diseases of anus and rectum: Secondary | ICD-10-CM | POA: Insufficient documentation

## 2011-01-18 DIAGNOSIS — K219 Gastro-esophageal reflux disease without esophagitis: Secondary | ICD-10-CM

## 2011-01-18 DIAGNOSIS — Z8719 Personal history of other diseases of the digestive system: Secondary | ICD-10-CM

## 2011-01-18 DIAGNOSIS — K5010 Crohn's disease of large intestine without complications: Secondary | ICD-10-CM

## 2011-01-18 DIAGNOSIS — R109 Unspecified abdominal pain: Secondary | ICD-10-CM

## 2011-01-18 DIAGNOSIS — R198 Other specified symptoms and signs involving the digestive system and abdomen: Secondary | ICD-10-CM

## 2011-01-18 DIAGNOSIS — K5730 Diverticulosis of large intestine without perforation or abscess without bleeding: Secondary | ICD-10-CM

## 2011-01-18 MED ORDER — MESALAMINE 1.2 G PO TBEC: 1200.0000 mg | DELAYED_RELEASE_TABLET | Freq: Every day | ORAL | Status: DC

## 2011-01-18 MED ORDER — PEG-KCL-NACL-NASULF-NA ASC-C 100 G PO SOLR: 1.0000 | Freq: Once | ORAL | Status: DC

## 2011-01-18 MED ORDER — ALIGN 4 MG PO CAPS: 1.0000 | ORAL_CAPSULE | Freq: Every day | ORAL | Status: DC

## 2011-01-18 NOTE — Patient Instructions (Signed)
Your procedure has been scheduled for 01/21/2011, please follow the seperate instructions.  Propofol sedation handout given.  Take the Lialda two tablet by mouth in the morning, samples given.  Take Align once a day, samples given.

## 2011-01-18 NOTE — Telephone Encounter (Signed)
rx sent to [facility] i called and rx the prep and they put a note in the system, i resent to local pharmacy

## 2011-01-18 NOTE — Progress Notes (Signed)
History of Present Illness:  This is a fairly complex 77 year old Caucasian female with multiple gastrointestinal and general medical problems required extensive time for chart review, patient interview, and examination. She has chronic severe diverticulosis with suspected segmental colitis, and complains of intermittent lower abdominal pain and maroon stools. Her acid reflux well controlled on daily PPI therapy. She tends to have mostly constipation and uses fiber supplements, when necessary MiraLax, and stool softeners. Her main complaint today is rather persistent left lower quadrant discomfort and associated constipation. She had colonoscopy several years ago which showed severe complicated diverticulosis. Her adult onset diabetes appears to be under good control. The patient also has suspected chronic pancreatitis and is on Creon 1 capsule to the date. Other problems have included severe and interstitial cystitis with chronic anticholinergic use, associated dry mouth syndrome, and a long history of inner ear vertigo. She's had mild anorexia and some gradual weight loss. She specifically denies upper gastrointestinal or hepatobiliary or systemic complaints. Extensive lab and radiographic review shows recent CT scan of the abdomen that showed thickening of her sigmoid colon area no discrete mass. Examination of the pancreas is unremarkable, and she appears to have some splenic granulomas, gallbladder surgically absent. No evidence of significant anemia or other metabolic abnormalities on lab review.  I have reviewed this patient's present history, medical and surgical past history, allergies and medications.     ROS: The remainder of the 10 point ROS is negative     Physical Exam: General well developed well nourished patient in no acute distress, appearing her stated age Eyes PERRLA, no icterus, fundoscopic exam per opthamologist Skin no lesions noted Neck supple, no adenopathy, no thyroid  enlargement, no tenderness Chest clear to percussion and auscultation Heart no significant murmurs, gallops or rubs noted Abdomen no hepatosplenomegaly masses or tenderness, BS normal.  Rectal inspection normal no fissures, or fistulae noted.  . easily exam shows an anterior rectal wall mass like abnormality. There is no evidence of impaction or blood or mucus in her stool. Patient has had previous rectocele repair. Extremities no acute joint lesions, edema, phlebitis or evidence of cellulitis. Neurologic patient oriented x 3, cranial nerves intact, no focal neurologic deficits noted. Psychological mental status normal and normal affect.  Assessment and plan: Severe chronic diverticulosis with segmental colitis. I am concerned about her digital rectal exam. Her previous colonoscopy was extremely difficult causes severe diverticulosis. I think this needs to be repeated with propofol sedation. I have placed her on Lialda 2.4 g a day and Align probiotic therapy pending her colon exam. He is to continue her other medications as listed and reviewed in her record.  Encounter Diagnoses  Name Primary?  . Abdominal pain Yes  . Rectal mass   . Segmental colitis   . Diverticulosis of colon (without mention of hemorrhage)   . GERD (gastroesophageal reflux disease)   . History of chronic pancreatitis

## 2011-01-21 ENCOUNTER — Encounter: Payer: Self-pay | Admitting: Gastroenterology

## 2011-01-21 ENCOUNTER — Ambulatory Visit (AMBULATORY_SURGERY_CENTER): Payer: Medicare Other | Admitting: Gastroenterology

## 2011-01-21 VITALS — BP 143/57 | HR 67 | Temp 97.8°F | Resp 12 | Ht 65.0 in | Wt 164.0 lb

## 2011-01-21 DIAGNOSIS — K5900 Constipation, unspecified: Secondary | ICD-10-CM

## 2011-01-21 DIAGNOSIS — Z86010 Personal history of colonic polyps: Secondary | ICD-10-CM

## 2011-01-21 DIAGNOSIS — K6289 Other specified diseases of anus and rectum: Secondary | ICD-10-CM

## 2011-01-21 DIAGNOSIS — R109 Unspecified abdominal pain: Secondary | ICD-10-CM

## 2011-01-21 DIAGNOSIS — K648 Other hemorrhoids: Secondary | ICD-10-CM

## 2011-01-21 DIAGNOSIS — R198 Other specified symptoms and signs involving the digestive system and abdomen: Secondary | ICD-10-CM | POA: Insufficient documentation

## 2011-01-21 DIAGNOSIS — K5730 Diverticulosis of large intestine without perforation or abscess without bleeding: Secondary | ICD-10-CM

## 2011-01-21 HISTORY — PX: COLONOSCOPY: SHX174

## 2011-01-21 MED ORDER — SODIUM CHLORIDE 0.9 % IV SOLN: 500.0000 mL | INTRAVENOUS | Status: DC

## 2011-01-21 NOTE — Progress Notes (Signed)
Patient did not experience any of the following events: a burn prior to discharge; a fall within the facility; wrong site/side/patient/procedure/implant event; or a hospital transfer or hospital admission upon discharge from the facility. (G8907) Patient did not have preoperative order for IV antibiotic SSI prophylaxis. (G8918)  

## 2011-01-21 NOTE — Patient Instructions (Signed)
FOLLOW THE DISCHARGE INSTRUCTIONS ON THE GREEN AND BLUE INSTRUCTION SHEETS.  CONTINUE YOUR MEDICATIONS.  HIGH FIBER DIET WITH LIBERAL FLUID INTAKE.

## 2011-01-22 ENCOUNTER — Telehealth: Payer: Self-pay | Admitting: *Deleted

## 2011-01-22 NOTE — Telephone Encounter (Signed)

## 2011-01-23 ENCOUNTER — Other Ambulatory Visit: Payer: Medicare Other | Admitting: Gastroenterology

## 2011-01-29 ENCOUNTER — Ambulatory Visit (INDEPENDENT_AMBULATORY_CARE_PROVIDER_SITE_OTHER): Payer: Medicare Other | Admitting: Urology

## 2011-01-29 DIAGNOSIS — N3010 Interstitial cystitis (chronic) without hematuria: Secondary | ICD-10-CM

## 2011-01-31 ENCOUNTER — Telehealth: Payer: Self-pay | Admitting: Gastroenterology

## 2011-01-31 NOTE — Telephone Encounter (Signed)
movi prep was sent to [facility] by mistake i called that day and cx the prep [facility] has the notes to show i cxed the rx. The rep states that the patient must call and get a mailing label to mail prep back it will be no problem. I have advise pt and he will come pick prep up I will leave it at my desk.

## 2011-02-22 ENCOUNTER — Ambulatory Visit: Payer: Medicare Other | Admitting: Gastroenterology

## 2011-02-26 ENCOUNTER — Encounter: Payer: Self-pay | Admitting: Gastroenterology

## 2011-02-26 ENCOUNTER — Ambulatory Visit (INDEPENDENT_AMBULATORY_CARE_PROVIDER_SITE_OTHER): Payer: Medicare Other | Admitting: Gastroenterology

## 2011-02-26 VITALS — BP 124/66 | HR 80 | Ht 64.0 in | Wt 167.8 lb

## 2011-02-26 DIAGNOSIS — K861 Other chronic pancreatitis: Secondary | ICD-10-CM | POA: Insufficient documentation

## 2011-02-26 DIAGNOSIS — K5901 Slow transit constipation: Secondary | ICD-10-CM | POA: Insufficient documentation

## 2011-02-26 MED ORDER — LINACLOTIDE 145 MCG PO CAPS: 1.0000 | ORAL_CAPSULE | Freq: Every day | ORAL | Status: DC

## 2011-02-26 NOTE — Patient Instructions (Signed)
Take Linzess once a day on an empty stomach, samples given and an rx sent to your pharmacy.

## 2011-02-26 NOTE — Progress Notes (Signed)
History of Present Illness: This is a 78 year old Caucasian female with constipation predominant IBS and severe diverticulosis with chronic abdominal gas, bloating, and suspected chronic pancreatitis associated with adult onset diabetes. She has had reactions to many medications, and has discontinued amino salicylates, probiotics, Antivert, and MiraLax. She is on several different types of anti-cholinergic medications for bladder dysfunction which seem to be exacerbating her  constipation and causing severe dry mouth. However, the patient says she cannot do without these medications. He is up to date her endoscopy and colonoscopy exams, and has no anorexia, weight loss, rectal bleeding, or other worrisome symptomatology. She is on chronic Creon one by mouth 3 times a day.    Current Medications, Allergies, Past Medical History, Past Surgical History, Family History and Social History were reviewed in [employer] record.   Assessment and plan: Severe diverticulosis with constipation exacerbated by anticholinergic medications. We will try a Linzess 145 mcg daily, I've told her to discontinue Antivert and to check with her urologist about her VESIcare and Urelle prescriptions. Otherwise we have reviewed her medications in detail, and I will see her as needed. This patient had chronic GI problems for over 30 years, and overall seems to be doing fairly well. No diagnosis found.

## 2011-03-01 ENCOUNTER — Ambulatory Visit (INDEPENDENT_AMBULATORY_CARE_PROVIDER_SITE_OTHER): Payer: Medicare Other | Admitting: Urology

## 2011-03-01 DIAGNOSIS — N3010 Interstitial cystitis (chronic) without hematuria: Secondary | ICD-10-CM

## 2011-03-06 ENCOUNTER — Telehealth: Payer: Self-pay | Admitting: Gastroenterology

## 2011-03-06 NOTE — Telephone Encounter (Signed)
NO ANSWER ON HOME NUMBER; NO ANSWERING MACHINE.

## 2011-03-07 MED ORDER — LINACLOTIDE 145 MCG PO CAPS: 1.0000 | ORAL_CAPSULE | Freq: Every day | ORAL | Status: DC

## 2011-03-13 ENCOUNTER — Telehealth: Payer: Self-pay | Admitting: Gastroenterology

## 2011-03-13 NOTE — Telephone Encounter (Signed)
Patient states that she has stopped amitiza since doing linzess and wanted to make sure that is ok. She has been having some watery stools on linzess so I have advised to continue to hold [place]

## 2011-04-05 ENCOUNTER — Ambulatory Visit (INDEPENDENT_AMBULATORY_CARE_PROVIDER_SITE_OTHER): Payer: Medicare Other | Admitting: Urology

## 2011-04-05 DIAGNOSIS — N3020 Other chronic cystitis without hematuria: Secondary | ICD-10-CM

## 2011-04-05 DIAGNOSIS — N3010 Interstitial cystitis (chronic) without hematuria: Secondary | ICD-10-CM

## 2011-04-15 ENCOUNTER — Encounter (INDEPENDENT_AMBULATORY_CARE_PROVIDER_SITE_OTHER): Payer: Self-pay | Admitting: General Surgery

## 2011-04-15 ENCOUNTER — Ambulatory Visit (INDEPENDENT_AMBULATORY_CARE_PROVIDER_SITE_OTHER): Payer: Medicare Other | Admitting: General Surgery

## 2011-04-15 VITALS — BP 126/74 | HR 83 | Temp 97.6°F | Ht 64.0 in | Wt 169.6 lb

## 2011-04-15 DIAGNOSIS — K644 Residual hemorrhoidal skin tags: Secondary | ICD-10-CM | POA: Insufficient documentation

## 2011-04-15 NOTE — Patient Instructions (Signed)
Stop using baby wipes.  Use soft toilet paper.

## 2011-04-15 NOTE — Progress Notes (Signed)
Patient ID: Deborah Gray, female   DOB: 1933/08/30, 78 y.o.   MRN: [id number]  Chief Complaint  Patient presents with  . Follow-up    reck hems    HPI Deborah Gray is a 78 y.o. female.   HPI  She is self referred because she wants to have her hemorrhoids rechecked. She has had a long history of hemorrhoidal disease. She has undergone rubber band ligation in our office before. She was last seen by Dr. Gerri Spore August 2010.  She presents today because of some discomfort and redness anteriorly. She cleans herself with baby wipes after bowel movements. She's been using some hemorrhoidal cream and this seems to have helped. She is not having any bleeding. She does have some chronic constipation and has to strain to have a bowel movement.  She's had a recent colonoscopy which did not reveal any neoplasms.  Past Medical History  Diagnosis Date  . Type II or unspecified type diabetes mellitus without mention of complication, not stated as uncontrolled   . Dysphagia, pharyngoesophageal phase   . Esophageal reflux   . Unspecified constipation   . Benign neoplasm of colon   . Internal hemorrhoids without mention of complication   . Diaphragmatic hernia without mention of obstruction or gangrene   . Stricture and stenosis of esophagus   . Unspecified gastritis and gastroduodenitis without mention of hemorrhage   . Diverticulosis of colon (without mention of hemorrhage)   . Hypertension   . Arthritis   . Heart murmur   . Hyperlipidemia     Past Surgical History  Procedure Date  . Dilation and curettage of uterus   . Cholecystectomy   . Laparoscopic vaginal hysterectomy   . Rectocele repair   . Breast lumpectomy     right  . Total knee arthroplasty     right  . Foot surgery     left  . Abdominal hysterectomy   . Eye surgery     Family History  Problem Relation Age of Onset  . Ovarian cancer Mother   . Breast cancer Sister   . Liver cancer Sister   . Colon cancer Cousin     . Diabetes Maternal Aunt   . Diabetes Father   . Heart disease Father   . Liver disease Cousin     Social History History  Substance Use Topics  . Smoking status: Never Smoker   . Smokeless tobacco: Never Used  . Alcohol Use: No    Allergies  Allergen Reactions  . Sulfonamide Derivatives     Current Outpatient Prescriptions  Medication Sig Dispense Refill  . Amylase-Lipase-Protease (CREON 20 PO) Take 1 capsule by mouth 3 (three) times daily with meals.        Marland Kitchen aspirin 81 MG tablet Take 81 mg by mouth at bedtime.        . Calcium Carbonate-Vitamin D (CALCIUM 600+D HIGH POTENCY) 600-400 MG-UNIT per tablet Take 0.5 tablets by mouth daily.       Marland Kitchen esomeprazole (NEXIUM) 40 MG capsule Take 40 mg by mouth at bedtime.       Marland Kitchen ezetimibe (ZETIA) 10 MG tablet Take 10 mg by mouth daily.        . Linaclotide (LINZESS) 145 MCG CAPS Take 1 capsule by mouth daily.  90 capsule  3  . meclizine (ANTIVERT) 25 MG tablet Take 25 mg by mouth as needed.        . metFORMIN (GLUCOPHAGE) 500 MG tablet Take 500 mg by  mouth 2 (two) times daily with a meal.        . mometasone (NASONEX) 50 MCG/ACT nasal spray Place 2 sprays into the nose daily.        . nateglinide (STARLIX) 120 MG tablet Take 120 mg by mouth daily. 3 times a day with meals      . Omega-3 Fatty Acids (FISH OIL) 1200 MG CAPS Take 1 capsule by mouth. Take 4 capsules daily.       . Probiotic Product (ALIGN) 4 MG CAPS Take 1 capsule by mouth daily.  14 capsule  0  . Solifenacin Succinate (VESICARE PO) Take 1 capsule by mouth daily.        Marland Kitchen URELLE (URELLE/URISED) 81 MG TABS Take 1 tablet by mouth 3 (three) times daily.        . valsartan (DIOVAN) 80 MG tablet Take 80 mg by mouth daily.        Marland Kitchen VITAMIN E PO Take 1 capsule by mouth. Four times per week      . Vitamin Mixture (ESTER-C) 500-60 MG TABS Take by mouth. 1 tab three times weekly          Review of Systems Review of Systems  Gastrointestinal: Positive for constipation and rectal  pain. Negative for blood in stool.    Blood pressure 126/74, pulse 83, temperature 97.6 F (36.4 C), temperature source Temporal, height 5\' 4"  (1.626 m), weight 169 lb 9.6 oz (76.93 kg), SpO2 98.00%.  Physical Exam Physical Exam  Constitutional: She appears well-developed and well-nourished. No distress.  Genitourinary:       There are external hemorrhoids present in the right anterior and right posterior positions. No anal fissures. There is a punctate hole in the perineum with minimal clear drainage.  No erythema or fluctuance is noted. On digital rectal exam, the sphincter tone is slightly decreased, no masses are noted and there is no blood.  Anoscopy demonstrates moderate sized noninflamed internal hemorrhoids in the right anterior and right posterior position.    Data Reviewed Notes from old chart.  Assessment    Small external hemorrhoids and moderate size internal hemorrhoids without complication. Small perineal wound appears to be healing. She is getting better. No indication for hemorrhoidectomy.    Plan    I have instructed her to stop using the baby wipes. Return visit as needed.      Macdonald Rigor J 04/15/2011, 5:38 PM

## 2011-04-19 ENCOUNTER — Ambulatory Visit (INDEPENDENT_AMBULATORY_CARE_PROVIDER_SITE_OTHER): Payer: Medicare Other | Admitting: Urology

## 2011-04-19 DIAGNOSIS — N3010 Interstitial cystitis (chronic) without hematuria: Secondary | ICD-10-CM

## 2011-05-03 ENCOUNTER — Ambulatory Visit (INDEPENDENT_AMBULATORY_CARE_PROVIDER_SITE_OTHER): Payer: Medicare Other | Admitting: Urology

## 2011-05-03 DIAGNOSIS — N3020 Other chronic cystitis without hematuria: Secondary | ICD-10-CM

## 2011-05-03 DIAGNOSIS — N3010 Interstitial cystitis (chronic) without hematuria: Secondary | ICD-10-CM

## 2011-05-14 ENCOUNTER — Ambulatory Visit (INDEPENDENT_AMBULATORY_CARE_PROVIDER_SITE_OTHER): Payer: Medicare Other | Admitting: Urology

## 2011-05-14 DIAGNOSIS — N3010 Interstitial cystitis (chronic) without hematuria: Secondary | ICD-10-CM

## 2011-06-11 ENCOUNTER — Ambulatory Visit (INDEPENDENT_AMBULATORY_CARE_PROVIDER_SITE_OTHER): Payer: Medicare Other | Admitting: Urology

## 2011-06-11 DIAGNOSIS — N3010 Interstitial cystitis (chronic) without hematuria: Secondary | ICD-10-CM

## 2011-07-05 ENCOUNTER — Ambulatory Visit (INDEPENDENT_AMBULATORY_CARE_PROVIDER_SITE_OTHER): Payer: Medicare Other | Admitting: Gastroenterology

## 2011-07-05 ENCOUNTER — Encounter: Payer: Self-pay | Admitting: Gastroenterology

## 2011-07-05 VITALS — BP 118/74 | HR 72 | Ht 64.0 in | Wt 168.2 lb

## 2011-07-05 DIAGNOSIS — R1110 Vomiting, unspecified: Secondary | ICD-10-CM

## 2011-07-05 DIAGNOSIS — R109 Unspecified abdominal pain: Secondary | ICD-10-CM

## 2011-07-05 DIAGNOSIS — K5900 Constipation, unspecified: Secondary | ICD-10-CM

## 2011-07-05 DIAGNOSIS — K5909 Other constipation: Secondary | ICD-10-CM

## 2011-07-05 DIAGNOSIS — K5730 Diverticulosis of large intestine without perforation or abscess without bleeding: Secondary | ICD-10-CM

## 2011-07-05 DIAGNOSIS — K589 Irritable bowel syndrome without diarrhea: Secondary | ICD-10-CM

## 2011-07-05 DIAGNOSIS — K224 Dyskinesia of esophagus: Secondary | ICD-10-CM

## 2011-07-05 MED ORDER — LINACLOTIDE 145 MCG PO CAPS: 1.0000 | ORAL_CAPSULE | Freq: Every day | ORAL | Status: DC

## 2011-07-05 NOTE — Progress Notes (Signed)
This is a very complex 78 year old Caucasian female who has multiple problems as per my most recent clinic notes. I will not review her history in detail but she has suspected chronic pancreatitis, severe diverticulosis with associated constipation, gas and bloating, chronic GERD, and she now presents with symptoms of possible gastric outlet obstruction or gastroparesis with postprandial early satiety, nausea, and occasional emesis. She is status post laparoscopic cholecystectomy. There is no history despite these complaints of weight loss, hepatobiliary symptoms or systemic symptoms. She is on anticholinergic medications because or bladder incontinence he. Recently we placed her on Linzess 145 mg  Day with resultant diarrhea, she currently is using this every other day. She is taken probiotics for some time. Esophageal valuation the past has confirmed" nutcracker esophagus" and she has atypical chest pain, and also is on Nexium 40 mg a day for any element of acid reflux. Patient denies abuse of alcohol, cigarettes, or NSAIDs. Endoscopy within the last 2 years did not show any specific abnormalities. She is up-to-date on her colonoscopy exams. Despite all these complaints she's had no anorexia or weight loss.  Current Medications, Allergies, Past Medical History, Past Surgical History, Family History and Social History were reviewed in [employer] record.  Pertinent Review of Systems Negative   Physical Exam: Healthy-appearing patient in no acute distress. Blood pressure 118/74, pulse 72 and regular, and weight 168 pounds with a BMI of 28.87. I cannot appreciate stigmata of chronic liver disease. Chest is clear and she appears to be in a regular rhythm without significant murmurs gallops or rubs. There is no evidence of abdominal distention, organomegaly, masses or tenderness. Bowel sounds are normal. I cannot appreciate a succussion splash. Peripheral extremities are unremarkable, and  mental status is normal.    Assessment and Plan: Chronic constipation predominant IBS with suspected upper GI motility disturbance. The patient relates in the past she did tolerate Reglan without difficulty. I cannot find a previous gastric emptying scan, and I have rescheduled this test and we'll consider prokinetic therapy depending on this result. I have given her more samples of  Linzess 145 mg to use every other day. She is status post cholecystectomy. Her diagnoses of chronic pancreatitis is only clinical, but she does feel that pancreatic enzymes have helped her with her digestive problems. CT scan of the abdomen was unremarkable in October of 2012 without evidence of pancreatic abnormalities. CBC and multi-chemistry profile at that time was normal. Encounter Diagnoses  Name Primary?  . Vomiting Yes  . Abdominal pain

## 2011-07-05 NOTE — Patient Instructions (Addendum)
Take Linzess one capsule first thing in the morning.  Your Gastric Empty Scan in scheduled 07/19/2011 arrive at 7:45am for a 8:00am appt, at [facility] long Radiology. Make sure you have nothing to eat or drink after midnight. You need to hold you Nexium starting on 07/18/2011. If you need to reschedule you can call [phone]

## 2011-07-19 ENCOUNTER — Ambulatory Visit (INDEPENDENT_AMBULATORY_CARE_PROVIDER_SITE_OTHER): Payer: Medicare Other | Admitting: Urology

## 2011-07-19 ENCOUNTER — Other Ambulatory Visit (HOSPITAL_COMMUNITY): Payer: Medicare Other

## 2011-07-19 DIAGNOSIS — N3010 Interstitial cystitis (chronic) without hematuria: Secondary | ICD-10-CM

## 2011-07-24 ENCOUNTER — Encounter (HOSPITAL_COMMUNITY)
Admission: RE | Admit: 2011-07-24 | Discharge: 2011-07-24 | Disposition: A | Discharge status: Home / Self Care | Payer: Medicare Other | Source: Ambulatory Visit | Attending: Gastroenterology | Admitting: Gastroenterology

## 2011-07-24 DIAGNOSIS — R112 Nausea with vomiting, unspecified: Secondary | ICD-10-CM | POA: Insufficient documentation

## 2011-07-24 DIAGNOSIS — R1110 Vomiting, unspecified: Secondary | ICD-10-CM

## 2011-07-24 DIAGNOSIS — R109 Unspecified abdominal pain: Secondary | ICD-10-CM | POA: Insufficient documentation

## 2011-07-24 DIAGNOSIS — R143 Flatulence: Secondary | ICD-10-CM | POA: Insufficient documentation

## 2011-07-24 DIAGNOSIS — E119 Type 2 diabetes mellitus without complications: Secondary | ICD-10-CM | POA: Insufficient documentation

## 2011-07-24 DIAGNOSIS — R142 Eructation: Secondary | ICD-10-CM | POA: Insufficient documentation

## 2011-07-24 DIAGNOSIS — R141 Gas pain: Secondary | ICD-10-CM | POA: Insufficient documentation

## 2011-07-24 MED ORDER — TECHNETIUM TC 99M SULFUR COLLOID
2.0000 | Freq: Once | INTRAVENOUS | Status: AC | PRN
  Administered 2011-07-24: 2.2 via INTRAVENOUS

## 2011-07-31 ENCOUNTER — Telehealth: Payer: Self-pay | Admitting: Gastroenterology

## 2011-07-31 MED ORDER — CIPROFLOXACIN HCL 500 MG PO TABS: 500.0000 mg | ORAL_TABLET | Freq: Two times a day (BID) | ORAL | Status: AC

## 2011-07-31 MED ORDER — METRONIDAZOLE 500 MG PO TABS: 500.0000 mg | ORAL_TABLET | Freq: Three times a day (TID) | ORAL | Status: AC

## 2011-07-31 NOTE — Telephone Encounter (Signed)
Pt states she is not having any nausea or vomiting. She reports no constipation, rectal bleeding, and only diarrhea if she takes Linzess. Pt aware of Dr. Lauro Franklin recommendations and rx's sent to pharmacy.

## 2011-07-31 NOTE — Telephone Encounter (Signed)
[place] pt. Pt states she started having abdominal pain on her left side Friday. She thinks she has diverticulitis. She found an old Cipro that she had left over and it helped her pain. Sunday morning states she had a temp of 100.1. Today she has a temp of 99. Pt wonders if she needs antibiotics since the cipro seemed to help. Dr. Rhea Belton as doc of the day please advise.

## 2011-07-31 NOTE — Telephone Encounter (Signed)
Please be sure he is able to eat, having significant nausea or vomiting. Not having rectal bleeding diarrhea or severe constipation If not, empiric treatment for diverticulitis is reasonable. For this I recommend Cipro 500 mg by mouth twice daily, metronidazole 500mg  3 times a day x10 days She is to be seen within a month to ensure resolution of symptoms.

## 2011-08-06 ENCOUNTER — Telehealth: Payer: Self-pay | Admitting: Gastroenterology

## 2011-08-06 NOTE — Telephone Encounter (Signed)
Last OV 07/05/11 and normal GES on 07/24/11. She reports LLQ pain since that exam on 07/24/11. The pain got so bad one day she went to her PCP who thought she was having spasms. She later began running a fever and called in last week and was placed on Cipro and Flagyl. Today, pt reports the antibiotics have helped, but her s&s have not gone away. She feels urgency with BMs and feels she isn't emptying her stools completely. Dr Jarold Motto asked her to stop the Vesicare which she did. She reports old hx of rectocele, hysterectomy and birthing a large with associated scar tissue is contributing to her problems. Now, she states the pain is across her entire lower abdomen, but is worse on the l side. Pt wants to know if there another test she can have done to find this chronic problem? Please advise.

## 2011-08-06 NOTE — Telephone Encounter (Signed)
She has chronic IBS. I have no other thoughts about her management or care. Offer her referral to [facility] if she desires. She also needs to followup these multiple somatic complaints with her primary care doctor

## 2011-08-06 NOTE — Telephone Encounter (Signed)
Left message with pt's husband for her to call us back.

## 2011-08-06 NOTE — Telephone Encounter (Signed)
Informed pt of Dr Norval Gable suggestion that she be referred to [facility] for other advice about her care. Pt states she will complete the antibiotics, see if they work and if not, she will call back to be referred. Pt states she's reluctant d/t the distance to [facility].

## 2011-08-12 ENCOUNTER — Telehealth: Payer: Self-pay | Admitting: Gastroenterology

## 2011-08-12 NOTE — Telephone Encounter (Signed)
Informed pt of her appt at [facility] on 10/28/11 at 2pm; pt stated understanding.

## 2011-08-16 ENCOUNTER — Ambulatory Visit (INDEPENDENT_AMBULATORY_CARE_PROVIDER_SITE_OTHER): Payer: Medicare Other | Admitting: Urology

## 2011-08-16 DIAGNOSIS — N3010 Interstitial cystitis (chronic) without hematuria: Secondary | ICD-10-CM

## 2011-08-30 ENCOUNTER — Telehealth: Payer: Self-pay | Admitting: Gastroenterology

## 2011-09-03 NOTE — Telephone Encounter (Signed)
Pt has been referred to [facility]; has an August, 2013 appt. lmom for pt to call back.

## 2011-09-11 NOTE — Telephone Encounter (Signed)
Pt never called back.

## 2011-09-19 ENCOUNTER — Other Ambulatory Visit: Payer: Self-pay | Admitting: Family Medicine

## 2011-09-19 DIAGNOSIS — IMO0001 Reserved for inherently not codable concepts without codable children: Secondary | ICD-10-CM

## 2011-09-20 ENCOUNTER — Ambulatory Visit (INDEPENDENT_AMBULATORY_CARE_PROVIDER_SITE_OTHER): Payer: Medicare Other | Admitting: Urology

## 2011-09-20 DIAGNOSIS — N3010 Interstitial cystitis (chronic) without hematuria: Secondary | ICD-10-CM

## 2011-09-23 ENCOUNTER — Ambulatory Visit (HOSPITAL_COMMUNITY)
Admission: RE | Admit: 2011-09-23 | Discharge: 2011-09-23 | Disposition: A | Discharge status: Home / Self Care | Payer: Medicare Other | Source: Ambulatory Visit | Attending: Family Medicine | Admitting: Family Medicine

## 2011-09-23 DIAGNOSIS — Z1231 Encounter for screening mammogram for malignant neoplasm of breast: Secondary | ICD-10-CM | POA: Insufficient documentation

## 2011-09-23 DIAGNOSIS — IMO0001 Reserved for inherently not codable concepts without codable children: Secondary | ICD-10-CM

## 2011-10-18 ENCOUNTER — Ambulatory Visit (INDEPENDENT_AMBULATORY_CARE_PROVIDER_SITE_OTHER): Payer: Medicare Other | Admitting: Urology

## 2011-10-18 DIAGNOSIS — N3010 Interstitial cystitis (chronic) without hematuria: Secondary | ICD-10-CM

## 2011-11-15 ENCOUNTER — Ambulatory Visit (INDEPENDENT_AMBULATORY_CARE_PROVIDER_SITE_OTHER): Payer: Medicare Other | Admitting: Urology

## 2011-11-15 DIAGNOSIS — N3010 Interstitial cystitis (chronic) without hematuria: Secondary | ICD-10-CM

## 2011-12-13 ENCOUNTER — Ambulatory Visit (INDEPENDENT_AMBULATORY_CARE_PROVIDER_SITE_OTHER): Payer: Medicare Other | Admitting: Urology

## 2011-12-13 DIAGNOSIS — N3010 Interstitial cystitis (chronic) without hematuria: Secondary | ICD-10-CM

## 2011-12-31 ENCOUNTER — Ambulatory Visit (INDEPENDENT_AMBULATORY_CARE_PROVIDER_SITE_OTHER): Payer: Medicare Other | Admitting: Urology

## 2011-12-31 DIAGNOSIS — N3010 Interstitial cystitis (chronic) without hematuria: Secondary | ICD-10-CM

## 2012-01-10 ENCOUNTER — Ambulatory Visit (INDEPENDENT_AMBULATORY_CARE_PROVIDER_SITE_OTHER): Payer: Medicare Other | Admitting: Urology

## 2012-01-10 DIAGNOSIS — N3010 Interstitial cystitis (chronic) without hematuria: Secondary | ICD-10-CM

## 2012-01-27 DIAGNOSIS — K5790 Diverticulosis of intestine, part unspecified, without perforation or abscess without bleeding: Secondary | ICD-10-CM | POA: Insufficient documentation

## 2012-01-27 DIAGNOSIS — K5900 Constipation, unspecified: Secondary | ICD-10-CM | POA: Insufficient documentation

## 2012-01-27 DIAGNOSIS — Z8719 Personal history of other diseases of the digestive system: Secondary | ICD-10-CM | POA: Insufficient documentation

## 2012-02-07 ENCOUNTER — Ambulatory Visit (INDEPENDENT_AMBULATORY_CARE_PROVIDER_SITE_OTHER): Payer: Medicare Other | Admitting: Urology

## 2012-02-07 DIAGNOSIS — N3010 Interstitial cystitis (chronic) without hematuria: Secondary | ICD-10-CM

## 2012-03-13 ENCOUNTER — Ambulatory Visit (INDEPENDENT_AMBULATORY_CARE_PROVIDER_SITE_OTHER): Payer: Medicare Other | Admitting: Urology

## 2012-03-13 DIAGNOSIS — N3000 Acute cystitis without hematuria: Secondary | ICD-10-CM

## 2012-03-20 ENCOUNTER — Ambulatory Visit (INDEPENDENT_AMBULATORY_CARE_PROVIDER_SITE_OTHER): Payer: Medicare Other | Admitting: Urology

## 2012-03-20 DIAGNOSIS — N3010 Interstitial cystitis (chronic) without hematuria: Secondary | ICD-10-CM

## 2012-04-10 ENCOUNTER — Ambulatory Visit (INDEPENDENT_AMBULATORY_CARE_PROVIDER_SITE_OTHER): Payer: Medicare Other | Admitting: Urology

## 2012-04-10 DIAGNOSIS — N3010 Interstitial cystitis (chronic) without hematuria: Secondary | ICD-10-CM

## 2012-04-10 DIAGNOSIS — N3000 Acute cystitis without hematuria: Secondary | ICD-10-CM

## 2012-05-07 ENCOUNTER — Telehealth: Payer: Self-pay | Admitting: *Deleted

## 2012-05-07 NOTE — Telephone Encounter (Signed)
Can we refill Creon 24,000 units. One capsule po tid with meals. Metformin HCL 500 mg bid Starlix 120mg  one bid  She brought the refill request handwritten on paper.  Chart in your message area on counter nurses station.

## 2012-05-07 NOTE — Telephone Encounter (Signed)
may refill all with one years refills

## 2012-05-07 NOTE — Telephone Encounter (Signed)
Yes. May refill all these medicines with one years worth of refills.

## 2012-05-07 NOTE — Telephone Encounter (Signed)
Can we refill creon 24,000units. One capsule po tid with meals. Metformin hcl 500 mg bid Starlix 120mg  one bid  She brought the refill request handwritten on paper. Chart in your message area.

## 2012-05-07 NOTE — Telephone Encounter (Signed)
[facility] [place]. Pt notified.

## 2012-05-08 ENCOUNTER — Ambulatory Visit (INDEPENDENT_AMBULATORY_CARE_PROVIDER_SITE_OTHER): Payer: Medicare Other | Admitting: Urology

## 2012-05-08 DIAGNOSIS — N3010 Interstitial cystitis (chronic) without hematuria: Secondary | ICD-10-CM

## 2012-05-08 NOTE — Telephone Encounter (Signed)
Already addressed

## 2012-05-14 ENCOUNTER — Telehealth: Payer: Self-pay | Admitting: Family Medicine

## 2012-05-14 NOTE — Telephone Encounter (Signed)
Can we refill Creon 24,000 units. One capsule po tid with meals.  Metformin HCL 500 mg bid  Starlix 120mg  one bid  These meds are supposed to be all 90 day refills, but [facility] way, please call to fix error.

## 2012-05-14 NOTE — Telephone Encounter (Signed)
Discussed with Marcelino Duster (pharmacist) at [facility] and all medications were corrected to 90 day supply for the patient.

## 2012-06-12 ENCOUNTER — Ambulatory Visit (INDEPENDENT_AMBULATORY_CARE_PROVIDER_SITE_OTHER): Payer: Medicare Other | Admitting: Urology

## 2012-06-12 DIAGNOSIS — N3010 Interstitial cystitis (chronic) without hematuria: Secondary | ICD-10-CM

## 2012-07-17 ENCOUNTER — Ambulatory Visit (INDEPENDENT_AMBULATORY_CARE_PROVIDER_SITE_OTHER): Payer: Medicare Other | Admitting: Urology

## 2012-07-17 DIAGNOSIS — N3010 Interstitial cystitis (chronic) without hematuria: Secondary | ICD-10-CM

## 2012-08-14 ENCOUNTER — Ambulatory Visit (INDEPENDENT_AMBULATORY_CARE_PROVIDER_SITE_OTHER): Payer: Medicare Other | Admitting: Urology

## 2012-08-14 DIAGNOSIS — N3010 Interstitial cystitis (chronic) without hematuria: Secondary | ICD-10-CM

## 2012-09-02 ENCOUNTER — Other Ambulatory Visit: Payer: Self-pay | Admitting: *Deleted

## 2012-09-02 MED ORDER — VALSARTAN 80 MG PO TABS: 80.0000 mg | ORAL_TABLET | Freq: Every day | ORAL | Status: DC

## 2012-09-03 ENCOUNTER — Other Ambulatory Visit: Payer: Self-pay | Admitting: *Deleted

## 2012-09-03 MED ORDER — VALSARTAN 80 MG PO TABS: 80.0000 mg | ORAL_TABLET | Freq: Every day | ORAL | Status: DC

## 2012-09-15 ENCOUNTER — Ambulatory Visit (INDEPENDENT_AMBULATORY_CARE_PROVIDER_SITE_OTHER): Payer: Medicare Other | Admitting: Urology

## 2012-09-15 DIAGNOSIS — N3010 Interstitial cystitis (chronic) without hematuria: Secondary | ICD-10-CM

## 2012-10-05 ENCOUNTER — Other Ambulatory Visit: Payer: Self-pay | Admitting: Family Medicine

## 2012-10-05 ENCOUNTER — Telehealth: Payer: Self-pay | Admitting: Family Medicine

## 2012-10-05 DIAGNOSIS — Z79899 Other long term (current) drug therapy: Secondary | ICD-10-CM

## 2012-10-05 DIAGNOSIS — Z139 Encounter for screening, unspecified: Secondary | ICD-10-CM

## 2012-10-05 DIAGNOSIS — E782 Mixed hyperlipidemia: Secondary | ICD-10-CM

## 2012-10-05 DIAGNOSIS — E119 Type 2 diabetes mellitus without complications: Secondary | ICD-10-CM

## 2012-10-05 NOTE — Telephone Encounter (Signed)
Patient needs BW paperwork °

## 2012-10-05 NOTE — Telephone Encounter (Signed)
Lip liv met7 a1c

## 2012-10-06 ENCOUNTER — Other Ambulatory Visit: Payer: Self-pay | Admitting: Family Medicine

## 2012-10-06 NOTE — Telephone Encounter (Signed)
Blood work papers printed and left up front for patient pick up. Patient notified. 

## 2012-10-12 ENCOUNTER — Ambulatory Visit (HOSPITAL_COMMUNITY)
Admission: RE | Admit: 2012-10-12 | Discharge: 2012-10-12 | Disposition: A | Discharge status: Home / Self Care | Payer: Medicare Other | Source: Ambulatory Visit | Attending: Family Medicine | Admitting: Family Medicine

## 2012-10-12 DIAGNOSIS — Z1231 Encounter for screening mammogram for malignant neoplasm of breast: Secondary | ICD-10-CM | POA: Insufficient documentation

## 2012-10-12 DIAGNOSIS — Z139 Encounter for screening, unspecified: Secondary | ICD-10-CM

## 2012-10-14 LAB — BASIC METABOLIC PANEL
Potassium: 5.1 mEq/L (ref 3.5–5.3)
Sodium: 142 mEq/L (ref 135–145)

## 2012-10-14 LAB — LIPID PANEL
Cholesterol: 202 mg/dL — ABNORMAL HIGH (ref 0–200)
LDL Cholesterol: 122 mg/dL — ABNORMAL HIGH (ref 0–99)
Total CHOL/HDL Ratio: 4.2 Ratio
Triglycerides: 160 mg/dL — ABNORMAL HIGH (ref <150)
VLDL: 32 mg/dL (ref 0–40)

## 2012-10-14 LAB — HEMOGLOBIN A1C
Hgb A1c MFr Bld: 6.6 % — ABNORMAL HIGH (ref <5.7)
Mean Plasma Glucose: 143 mg/dL — ABNORMAL HIGH (ref <117)

## 2012-10-14 LAB — HEPATIC FUNCTION PANEL
ALT: 13 U/L (ref 0–35)
Total Protein: 7 g/dL (ref 6.0–8.3)

## 2012-10-15 ENCOUNTER — Encounter: Payer: Self-pay | Admitting: Gastroenterology

## 2012-10-15 ENCOUNTER — Ambulatory Visit (INDEPENDENT_AMBULATORY_CARE_PROVIDER_SITE_OTHER): Payer: Medicare Other | Admitting: Gastroenterology

## 2012-10-15 VITALS — BP 128/70 | HR 72 | Ht 62.5 in | Wt 170.1 lb

## 2012-10-15 DIAGNOSIS — R109 Unspecified abdominal pain: Secondary | ICD-10-CM

## 2012-10-15 MED ORDER — CILIDINIUM-CHLORDIAZEPOXIDE 2.5-5 MG PO CAPS: 1.0000 | ORAL_CAPSULE | Freq: Three times a day (TID) | ORAL | Status: DC | PRN

## 2012-10-15 MED ORDER — TRAMADOL HCL 50 MG PO TABS: 50.0000 mg | ORAL_TABLET | Freq: Three times a day (TID) | ORAL | Status: DC | PRN

## 2012-10-15 NOTE — Progress Notes (Signed)
This is a healthy 79 year old white female with chronic functional GI complaints and again is in the office complaining of her usual epigastric abdominal pain and nausea which has been present for the last 20 years.  She's had no emesis, change in bowel habits, melena, hematochezia, dysphagia, or systemic complaints.  She has suspected chronic pancreatitis of unexplained etiology, and is on by mouth pancreatic extracts.  She is status post cholecystectomy, and has known extensive diverticulosis.  She is up-to-date on her endoscopy exams and radiographs.  She does have type 2 diabetes and is treated with metformin 1 g twice a day by primary care.  She's had multiple recurrent  GI workups which have been negative, and she was evaluated at [facility] one year ago, also a negative evaluation,and was apparently told that she  symptomatic diverticulosis.  Patient denies lower GI complaints at this time.  Her epigastric pain is relieved by heating pad and lying down.  She is under a large amount of personal stress with sick family members.  She has refused referral to a pain clinic ,  Current Medications, Allergies, Past Medical History, Past Surgical History, Family History and Social History were reviewed in [employer] record.  ROS: All systems were reviewed and are negative unless otherwise stated in the HPI.          Physical Exam: Blood pressure 126/70, pulse 72 and regular weight 170.  Abdominal exam is entirely unremarkable without organomegaly, masses, or tenderness.  Bowel sounds are active but normal.  Mental status is normal.    Assessment and Plan: Suspected idiopathic chronic pancreatitis although I think her abdominal pain is functional in nature.  I have renewed her tramadol 50 mg every 6-8 hours and when necessary ,Librax, every 6-8 hours as needed for abdominal spasms, but if these medications do not help, I have told her that I will not see her in the future  before she is evaluated at pain clinic.  She is to continue followup with her primary care physician, and perhaps psychiatric consultation would be in order.  I do not think this patient needs further or repeat GI workups.

## 2012-10-15 NOTE — Patient Instructions (Addendum)
Librax and Tramadol  was given to patient(patient wants to shop around)  Please follow up with Dr. Jarold Motto in one year

## 2012-10-15 NOTE — Addendum Note (Signed)
Addended by: Ok Anis A on: 10/15/2012 11:12 AM   Modules accepted: Orders

## 2012-10-16 ENCOUNTER — Ambulatory Visit (INDEPENDENT_AMBULATORY_CARE_PROVIDER_SITE_OTHER): Payer: Medicare Other | Admitting: Urology

## 2012-10-16 DIAGNOSIS — N3010 Interstitial cystitis (chronic) without hematuria: Secondary | ICD-10-CM

## 2012-10-17 ENCOUNTER — Encounter: Payer: Self-pay | Admitting: *Deleted

## 2012-10-22 ENCOUNTER — Encounter: Payer: Self-pay | Admitting: Nurse Practitioner

## 2012-10-22 ENCOUNTER — Ambulatory Visit (INDEPENDENT_AMBULATORY_CARE_PROVIDER_SITE_OTHER): Payer: Medicare Other | Admitting: Nurse Practitioner

## 2012-10-22 VITALS — BP 128/70 | Ht 64.0 in | Wt 172.0 lb

## 2012-10-22 DIAGNOSIS — E1110 Type 2 diabetes mellitus with ketoacidosis without coma: Secondary | ICD-10-CM

## 2012-10-22 DIAGNOSIS — Z0000 Encounter for general adult medical examination without abnormal findings: Secondary | ICD-10-CM

## 2012-10-22 DIAGNOSIS — M8580 Other specified disorders of bone density and structure, unspecified site: Secondary | ICD-10-CM

## 2012-10-22 DIAGNOSIS — M899 Disorder of bone, unspecified: Secondary | ICD-10-CM

## 2012-10-22 DIAGNOSIS — Z01419 Encounter for gynecological examination (general) (routine) without abnormal findings: Secondary | ICD-10-CM

## 2012-10-22 MED ORDER — ISOMETHEPTENE-APAP-DICHLORAL 65-325-100 MG PO CAPS: 1.0000 | ORAL_CAPSULE | Freq: Four times a day (QID) | ORAL | Status: DC | PRN

## 2012-10-22 MED ORDER — NATEGLINIDE 120 MG PO TABS: 120.0000 mg | ORAL_TABLET | Freq: Three times a day (TID) | ORAL | Status: DC

## 2012-10-22 NOTE — Progress Notes (Signed)
  Subjective:    Patient ID: Deborah Gray, female    DOB: [birth year], 79 y.o.   MRN: [id number]  HPI presents for her wellness checkup. Gets regular vision and dental exams. Sees her GI specialist on a regular basis. Also sees urologist for interstitial cystitis. Having some sleep issues due to getting up 2-3 times per night for urination. Mild edema at nighttime. Off-and-on occasional migraine. Requesting a refill on Midrin which has worked well in the past. Has been under more stress lately taking care of her husband with his health issues as well as an elderly friend.    Review of Systems  Constitutional: Negative for activity change, appetite change and fatigue.  HENT: Negative for hearing loss, ear pain, congestion, sore throat and rhinorrhea.   Eyes: Negative for visual disturbance.  Respiratory: Negative for cough, chest tightness and shortness of breath.   Cardiovascular: Positive for leg swelling. Negative for chest pain and palpitations.  Gastrointestinal: Positive for nausea, abdominal pain and abdominal distention. Negative for vomiting, diarrhea, constipation and blood in stool.  Genitourinary: Negative for dysuria, urgency, frequency, vaginal discharge, enuresis, difficulty urinating and pelvic pain.  Neurological: Positive for headaches.  Psychiatric/Behavioral: Positive for sleep disturbance.       Objective:   Physical Exam  Vitals reviewed. Constitutional: She is oriented to person, place, and time. She appears well-developed. No distress.  HENT:  Right Ear: External ear normal.  Left Ear: External ear normal.  Mouth/Throat: Oropharynx is clear and moist.  Neck: Normal range of motion. Neck supple. No tracheal deviation present. No thyromegaly present.  Cardiovascular: Normal rate, regular rhythm and normal heart sounds.  Exam reveals no gallop.   No murmur heard. Pulmonary/Chest: Effort normal and breath sounds normal.  Abdominal: Soft. She exhibits no  distension. There is no tenderness.  Genitourinary: Vagina normal. No vaginal discharge found.  Musculoskeletal: She exhibits no edema.  Lymphadenopathy:    She has no cervical adenopathy.  Neurological: She is alert and oriented to person, place, and time.  Skin: Skin is warm and dry. No rash noted.  Psychiatric: She has a normal mood and affect. Her behavior is normal.   Breast exam: No masses noted, axilla no adenopathy. External GU pale with signs of hypoestrogenism noted. Bimanual exam ovaries nonpalpable, no obvious masses, no tenderness noted. Rectal exam normal, no stool for Hemoccult.        Assessment & Plan:  Well woman exam  Type II or unspecified type diabetes mellitus with ketoacidosis, not stated as uncontrolled - Plan: Microalbumin, urine  Routine general medical examination at a health care facility - Plan: POC Hemoccult Bld/Stl (3-Cd Home Screen)  Meds ordered this encounter  Medications  . nateglinide (STARLIX) 120 MG tablet    Sig: Take 1 tablet (120 mg total) by mouth 3 (three) times daily before meals.    Dispense:  270 tablet    Refill:  1    Order Specific Question:  Supervising Provider    Answer:  Merlyn Albert [[id number]]  . isometheptene-acetaminophen-dichloralphenazone (MIDRIN) 65-325-100 MG capsule    Sig: Take 1 capsule by mouth 4 (four) times daily as needed.    Dispense:  30 capsule    Refill:  0    Order Specific Question:  Supervising Provider    Answer:  Merlyn Albert [[id number]]   Routine followup in 3-4 months, call back sooner if any problems. Encouraged continued multivitamin with vitamin D and calcium. Next physical in one year.

## 2012-10-23 ENCOUNTER — Encounter: Payer: Self-pay | Admitting: Nurse Practitioner

## 2012-10-23 DIAGNOSIS — M8580 Other specified disorders of bone density and structure, unspecified site: Secondary | ICD-10-CM | POA: Insufficient documentation

## 2012-10-23 LAB — MICROALBUMIN, URINE: Microalb, Ur: 0.5 mg/dL (ref 0.00–1.89)

## 2012-10-23 NOTE — Assessment & Plan Note (Signed)
Bone density test has been ordered.

## 2012-10-28 ENCOUNTER — Other Ambulatory Visit (INDEPENDENT_AMBULATORY_CARE_PROVIDER_SITE_OTHER): Payer: Medicare Other | Admitting: *Deleted

## 2012-10-28 DIAGNOSIS — Z0000 Encounter for general adult medical examination without abnormal findings: Secondary | ICD-10-CM

## 2012-10-28 LAB — POC HEMOCCULT BLD/STL (HOME/3-CARD/SCREEN): Fecal Occult Blood, POC: NEGATIVE

## 2012-11-07 ENCOUNTER — Other Ambulatory Visit: Payer: Self-pay | Admitting: Family Medicine

## 2012-11-13 ENCOUNTER — Ambulatory Visit (INDEPENDENT_AMBULATORY_CARE_PROVIDER_SITE_OTHER): Payer: Medicare Other | Admitting: Urology

## 2012-11-13 DIAGNOSIS — N3010 Interstitial cystitis (chronic) without hematuria: Secondary | ICD-10-CM

## 2012-12-11 ENCOUNTER — Ambulatory Visit (INDEPENDENT_AMBULATORY_CARE_PROVIDER_SITE_OTHER): Payer: Medicare Other | Admitting: Urology

## 2012-12-11 DIAGNOSIS — N3010 Interstitial cystitis (chronic) without hematuria: Secondary | ICD-10-CM

## 2013-01-08 ENCOUNTER — Ambulatory Visit (INDEPENDENT_AMBULATORY_CARE_PROVIDER_SITE_OTHER): Payer: Medicare Other | Admitting: Urology

## 2013-01-08 DIAGNOSIS — N3010 Interstitial cystitis (chronic) without hematuria: Secondary | ICD-10-CM

## 2013-01-19 ENCOUNTER — Other Ambulatory Visit: Payer: Self-pay | Admitting: Family Medicine

## 2013-01-21 ENCOUNTER — Ambulatory Visit (INDEPENDENT_AMBULATORY_CARE_PROVIDER_SITE_OTHER): Payer: Medicare Other | Admitting: Family Medicine

## 2013-01-21 ENCOUNTER — Encounter: Payer: Self-pay | Admitting: Family Medicine

## 2013-01-21 VITALS — BP 148/82 | Temp 97.9°F | Ht 64.0 in | Wt 168.8 lb

## 2013-01-21 DIAGNOSIS — R109 Unspecified abdominal pain: Secondary | ICD-10-CM

## 2013-01-21 DIAGNOSIS — R079 Chest pain, unspecified: Secondary | ICD-10-CM

## 2013-01-21 NOTE — Progress Notes (Signed)
   Subjective:    Patient ID: Deborah Gray, female    DOB: [birth year], 79 y.o.   MRN: [id number]  Abdominal Pain This is a recurrent problem. The current episode started 1 to 4 weeks ago. The pain is located in the epigastric region. The quality of the pain is cramping and a sensation of fullness. The abdominal pain radiates to the chest. Associated symptoms comments: Pressure on chest and palpitations . The pain is aggravated by eating. The pain is relieved by sitting up (heating pad). She has tried oral narcotic analgesics for the symptoms. The treatment provided moderate relief. Her past medical history is significant for irritable bowel syndrome. fatty liver    Pos abdominal pain with family illness and stress  Not watching diet as good as uaual  One to one and a half hr, bloats up  Puts pressure in epigastrium  Wonders about it nearing chest, no exertional pain.  Feels a little pressure at times  Palpitations, doesn't fee quite right  Sis passed away fr dementia,   Review of Systems  Gastrointestinal: Positive for abdominal pain.   no pressure-like chest pain no headache no change in bowel habits no blood in stool no weight loss no weight gain ROS otherwise negative     Objective:   Physical Exam Alert hydration good. Vital stable. HEENT normal. Alert no apparent distress. Lungs clear heart regular in rhythm abdominal exam benign mild epigastric tenderness  EKG normal sinus rhythm no significant ST-T changes       Assessment & Plan:  Impression 1 nonspecific discomfort primarily epigastrium in nature. Worse with stress sometimes worse after meals. Review of gastroenterologist notes shows that scopes are up-to-date. No exertional component. No nausea or diaphoresis. Plan likely functional abdominal pain with an element of reflux. Symptomatic care discussed. No further workup at this time. WSL

## 2013-02-05 ENCOUNTER — Ambulatory Visit (INDEPENDENT_AMBULATORY_CARE_PROVIDER_SITE_OTHER): Payer: Medicare Other | Admitting: Urology

## 2013-02-05 DIAGNOSIS — N3010 Interstitial cystitis (chronic) without hematuria: Secondary | ICD-10-CM

## 2013-02-12 ENCOUNTER — Encounter: Payer: Self-pay | Admitting: Family Medicine

## 2013-03-05 ENCOUNTER — Other Ambulatory Visit: Payer: Self-pay | Admitting: *Deleted

## 2013-03-05 ENCOUNTER — Telehealth: Payer: Self-pay | Admitting: Family Medicine

## 2013-03-05 MED ORDER — VALSARTAN 80 MG PO TABS: 80.0000 mg | ORAL_TABLET | Freq: Every day | ORAL | Status: DC

## 2013-03-05 NOTE — Telephone Encounter (Signed)
Med sent to pharm pt notifed.

## 2013-03-05 NOTE — Telephone Encounter (Signed)
valsartan (DIOVAN) 80 MG tablet  [facility] supply [facility]   [facility]

## 2013-03-10 ENCOUNTER — Other Ambulatory Visit: Payer: Self-pay | Admitting: Family Medicine

## 2013-03-12 ENCOUNTER — Ambulatory Visit (INDEPENDENT_AMBULATORY_CARE_PROVIDER_SITE_OTHER): Payer: Medicare Other | Admitting: Urology

## 2013-03-12 DIAGNOSIS — N3010 Interstitial cystitis (chronic) without hematuria: Secondary | ICD-10-CM

## 2013-03-15 ENCOUNTER — Telehealth: Payer: Self-pay | Admitting: Family Medicine

## 2013-03-15 MED ORDER — METFORMIN HCL 500 MG PO TABS: ORAL_TABLET | ORAL | Status: DC

## 2013-03-15 NOTE — Telephone Encounter (Signed)
Med sent- pt notified

## 2013-03-15 NOTE — Telephone Encounter (Signed)
Patient needs Rx for Metformin 500 mg  [facility]

## 2013-04-09 ENCOUNTER — Ambulatory Visit (INDEPENDENT_AMBULATORY_CARE_PROVIDER_SITE_OTHER): Payer: Medicare Other | Admitting: Urology

## 2013-04-09 DIAGNOSIS — N3010 Interstitial cystitis (chronic) without hematuria: Secondary | ICD-10-CM

## 2013-04-19 ENCOUNTER — Telehealth: Payer: Self-pay | Admitting: Family Medicine

## 2013-04-19 ENCOUNTER — Other Ambulatory Visit: Payer: Self-pay | Admitting: *Deleted

## 2013-04-19 DIAGNOSIS — E785 Hyperlipidemia, unspecified: Secondary | ICD-10-CM

## 2013-04-19 DIAGNOSIS — E119 Type 2 diabetes mellitus without complications: Secondary | ICD-10-CM

## 2013-04-19 DIAGNOSIS — Z79899 Other long term (current) drug therapy: Secondary | ICD-10-CM

## 2013-04-19 NOTE — Telephone Encounter (Signed)
Lip liv A1C 

## 2013-04-19 NOTE — Telephone Encounter (Signed)
On 10/14/12: Lip, Liv, Met7, and A1C

## 2013-04-19 NOTE — Telephone Encounter (Signed)
Patient notified

## 2013-04-19 NOTE — Telephone Encounter (Signed)
Please do BW orders for appt 3/13

## 2013-04-20 LAB — LIPID PANEL
CHOLESTEROL: 203 mg/dL — AB (ref 0–200)
HDL: 48 mg/dL (ref >39)
LDL Cholesterol: 113 mg/dL — ABNORMAL HIGH (ref 0–99)
Total CHOL/HDL Ratio: 4.2 Ratio
Triglycerides: 211 mg/dL — ABNORMAL HIGH (ref <150)
VLDL: 42 mg/dL — ABNORMAL HIGH (ref 0–40)

## 2013-04-20 LAB — HEMOGLOBIN A1C
HEMOGLOBIN A1C: 6.5 % — AB (ref <5.7)
MEAN PLASMA GLUCOSE: 140 mg/dL — AB (ref <117)

## 2013-04-20 LAB — HEPATIC FUNCTION PANEL
ALK PHOS: 54 U/L (ref 39–117)
ALT: 13 U/L (ref 0–35)
AST: 12 U/L (ref 0–37)
Albumin: 4.3 g/dL (ref 3.5–5.2)
BILIRUBIN DIRECT: 0.1 mg/dL (ref 0.0–0.3)
BILIRUBIN INDIRECT: 0.4 mg/dL (ref 0.2–1.2)
BILIRUBIN TOTAL: 0.5 mg/dL (ref 0.2–1.2)
Total Protein: 6.9 g/dL (ref 6.0–8.3)

## 2013-04-23 ENCOUNTER — Ambulatory Visit (INDEPENDENT_AMBULATORY_CARE_PROVIDER_SITE_OTHER): Payer: Medicare Other | Admitting: Family Medicine

## 2013-04-23 ENCOUNTER — Encounter: Payer: Self-pay | Admitting: Family Medicine

## 2013-04-23 VITALS — BP 122/68 | Temp 97.9°F | Ht 64.0 in | Wt 168.0 lb

## 2013-04-23 DIAGNOSIS — H8190 Unspecified disorder of vestibular function, unspecified ear: Secondary | ICD-10-CM

## 2013-04-23 DIAGNOSIS — R6889 Other general symptoms and signs: Secondary | ICD-10-CM

## 2013-04-23 DIAGNOSIS — H8390 Unspecified disease of inner ear, unspecified ear: Secondary | ICD-10-CM

## 2013-04-23 DIAGNOSIS — R5383 Other fatigue: Secondary | ICD-10-CM

## 2013-04-23 DIAGNOSIS — D649 Anemia, unspecified: Secondary | ICD-10-CM

## 2013-04-23 DIAGNOSIS — R5381 Other malaise: Secondary | ICD-10-CM

## 2013-04-23 LAB — CBC WITH DIFFERENTIAL/PLATELET
BASOS ABS: 0 10*3/uL (ref 0.0–0.1)
Basophils Relative: 0 % (ref 0–1)
EOS ABS: 0.2 10*3/uL (ref 0.0–0.7)
EOS PCT: 2 % (ref 0–5)
HEMATOCRIT: 40.8 % (ref 36.0–46.0)
Hemoglobin: 13.8 g/dL (ref 12.0–15.0)
LYMPHS PCT: 32 % (ref 12–46)
Lymphs Abs: 2.4 10*3/uL (ref 0.7–4.0)
MCH: 29.4 pg (ref 26.0–34.0)
MCHC: 33.8 g/dL (ref 30.0–36.0)
MCV: 86.8 fL (ref 78.0–100.0)
MONO ABS: 0.8 10*3/uL (ref 0.1–1.0)
Monocytes Relative: 10 % (ref 3–12)
Neutro Abs: 4.2 10*3/uL (ref 1.7–7.7)
Neutrophils Relative %: 56 % (ref 43–77)
PLATELETS: 240 10*3/uL (ref 150–400)
RBC: 4.7 MIL/uL (ref 3.87–5.11)
RDW: 14.7 % (ref 11.5–15.5)
WBC: 7.5 10*3/uL (ref 4.0–10.5)

## 2013-04-23 MED ORDER — MECLIZINE HCL 25 MG PO TABS: 25.0000 mg | ORAL_TABLET | Freq: Three times a day (TID) | ORAL | Status: DC | PRN

## 2013-04-23 NOTE — Progress Notes (Signed)
   Subjective:    Patient ID: Deborah Gray, female    DOB: July 31, 1933, 80 y.o.   MRN: [id number]  Dizziness This is a recurrent problem. The current episode started today. Associated symptoms include fatigue. Pertinent negatives include no abdominal pain, chest pain, chills, congestion, coughing, diaphoresis or fever.  has had inner ear Was stretching her back when she sat up it caused her to have room spinning Dr Jeffie Pollock- stopped Vesicare and tried new med Patient denies unilateral numbness or weakness. Patient denies any difficulty speaking or swallowing She denies chest tightness pressure pain She does relate fatigue and cold intolerance. She denies double vision denies nocturnal headaches. No fevers. Hands, feet, ears, and nose feels cold a lot for the past few months.Felt cold a lot over the past few months.   Review of Systems  Constitutional: Positive for fatigue. Negative for fever, chills, diaphoresis and activity change.  HENT: Negative for congestion, dental problem, facial swelling and rhinorrhea.   Respiratory: Negative for cough and chest tightness.   Cardiovascular: Negative for chest pain.  Gastrointestinal: Negative for abdominal pain.  Endocrine: Positive for cold intolerance.  Neurological: Positive for dizziness.       Objective:   Physical Exam  Constitutional: She is oriented to person, place, and time. She appears well-developed and well-nourished.  Neck: No tracheal deviation present. No thyromegaly present.  Cardiovascular: Normal rate, regular rhythm and normal heart sounds.   Pulmonary/Chest: Effort normal and breath sounds normal. No respiratory distress.  Abdominal: Soft. Bowel sounds are normal. She exhibits no distension.  Musculoskeletal: She exhibits no edema.  Neurological: She is alert and oriented to person, place, and time.          Assessment & Plan:  #1 significant dizziness-this appears to be in her ear dysfunction. Evaluation was  properly completed including detailed neurologic evaluation and does not point toward cerebral vascular accident.  #2 HTN good control currently. There is no findings of any type of elevated blood pressure she should continue Diovan.  #3 proper use of meclizine was discussd. This should gradually get better over the next 7-14 days if it does not then she will need further reevaluation. #4 significant cold intolerance. Need to rule out the possibility of anemia due to fatigue plus also thyroid dysfunction labs ordered she is to followup next Friday with Dr. Tessie Eke regular health checkup. Those issues were not addressed today.

## 2013-04-24 LAB — TSH: TSH: 1.602 u[IU]/mL (ref 0.350–4.500)

## 2013-04-28 NOTE — Progress Notes (Signed)
Patient notified and verbalized understanding. 

## 2013-04-30 ENCOUNTER — Ambulatory Visit: Payer: Medicare Other | Admitting: Family Medicine

## 2013-05-07 ENCOUNTER — Encounter: Payer: Self-pay | Admitting: Family Medicine

## 2013-05-07 ENCOUNTER — Ambulatory Visit (INDEPENDENT_AMBULATORY_CARE_PROVIDER_SITE_OTHER): Payer: Medicare Other | Admitting: Family Medicine

## 2013-05-07 VITALS — BP 128/82 | Ht 64.0 in | Wt 169.0 lb

## 2013-05-07 DIAGNOSIS — K219 Gastro-esophageal reflux disease without esophagitis: Secondary | ICD-10-CM

## 2013-05-07 DIAGNOSIS — E119 Type 2 diabetes mellitus without complications: Secondary | ICD-10-CM

## 2013-05-07 DIAGNOSIS — M1990 Unspecified osteoarthritis, unspecified site: Secondary | ICD-10-CM

## 2013-05-07 DIAGNOSIS — K224 Dyskinesia of esophagus: Secondary | ICD-10-CM

## 2013-05-07 MED ORDER — FLUTICASONE PROPIONATE 50 MCG/ACT NA SUSP: 2.0000 | Freq: Every day | NASAL | Status: DC

## 2013-05-07 MED ORDER — VALSARTAN 80 MG PO TABS: 80.0000 mg | ORAL_TABLET | Freq: Every day | ORAL | Status: DC

## 2013-05-07 MED ORDER — EZETIMIBE 10 MG PO TABS: 10.0000 mg | ORAL_TABLET | Freq: Every day | ORAL | Status: DC

## 2013-05-07 MED ORDER — METFORMIN HCL 500 MG PO TABS: ORAL_TABLET | ORAL | Status: DC

## 2013-05-07 MED ORDER — BENZONATATE 200 MG PO CAPS: 200.0000 mg | ORAL_CAPSULE | Freq: Three times a day (TID) | ORAL | Status: DC | PRN

## 2013-05-07 NOTE — Progress Notes (Signed)
   Subjective:    Patient ID: Deborah Gray, female    DOB: [birth year], 80 y.o.   MRN: [id number]  Diabetes She presents for her follow-up diabetic visit. She has type 2 diabetes mellitus. Her breakfast blood glucose range is generally 110-130 mg/dl. Eye exam is current.   patient states trying to watch her diet. Compliant with her diabetes medicines. No obvious side effects. Results for orders placed in visit on 04/23/13  CBC WITH DIFFERENTIAL      Result Value Ref Range   WBC 7.5  4.0 - 10.5 K/uL   RBC 4.70  3.87 - 5.11 MIL/uL   Hemoglobin 13.8  12.0 - 15.0 g/dL   HCT 40.8  36.0 - 46.0 %   MCV 86.8  78.0 - 100.0 fL   MCH 29.4  26.0 - 34.0 pg   MCHC 33.8  30.0 - 36.0 g/dL   RDW 14.7  11.5 - 15.5 %   Platelets 240  150 - 400 K/uL   Neutrophils Relative % 56  43 - 77 %   Neutro Abs 4.2  1.7 - 7.7 K/uL   Lymphocytes Relative 32  12 - 46 %   Lymphs Abs 2.4  0.7 - 4.0 K/uL   Monocytes Relative 10  3 - 12 %   Monocytes Absolute 0.8  0.1 - 1.0 K/uL   Eosinophils Relative 2  0 - 5 %   Eosinophils Absolute 0.2  0.0 - 0.7 K/uL   Basophils Relative 0  0 - 1 %   Basophils Absolute 0.0  0.0 - 0.1 K/uL   Smear Review Criteria for review not met    TSH      Result Value Ref Range   TSH 1.602  0.350 - 4.500 uIU/mL   Patient compliant with her lipid medications. Has cut down fats in the diet. No side effects from her lipid medicines.   Patient had felt cold and chilled lately. Requested blood work to also look for anemia and thyroid dysfunction. Some family history of this  Requesting tessalon for cough.   heps  For raspy cough much more than mucines Had noted latest fatigue and feeling cold at times  Review of Systems No headache no chest pain no back pain no abdominal pain no change in bowel habits    Objective:   Physical Exam  Alert no apparent distress. HEENT normal. Neck supple. No palpable thyroid. Lungs clear. Heart regular rate and rhythm. Ankles without edema. Feet  sensation intact pulses good      Assessment & Plan:  Impression 1 type 2 diabetes good control. #2 hypertension good control. 3 hyperlipidemia good control. #4 fatigue and feeling cold nothing to do with thyroid or CBC discussed plan maintain same medications. Diet exercise discussed encourage. All medicines refilled. WSL recheck in 6 months.

## 2013-05-10 ENCOUNTER — Ambulatory Visit (INDEPENDENT_AMBULATORY_CARE_PROVIDER_SITE_OTHER): Payer: Medicare Other | Admitting: Family Medicine

## 2013-05-10 ENCOUNTER — Encounter: Payer: Self-pay | Admitting: Family Medicine

## 2013-05-10 VITALS — BP 126/60 | Temp 100.8°F | Ht 64.0 in | Wt 169.0 lb

## 2013-05-10 DIAGNOSIS — J329 Chronic sinusitis, unspecified: Secondary | ICD-10-CM

## 2013-05-10 DIAGNOSIS — J310 Chronic rhinitis: Secondary | ICD-10-CM

## 2013-05-10 MED ORDER — HYDROCOD POLST-CHLORPHEN POLST 10-8 MG/5ML PO LQCR: 5.0000 mL | Freq: Every evening | ORAL | Status: DC | PRN

## 2013-05-10 MED ORDER — AZITHROMYCIN 250 MG PO TABS: ORAL_TABLET | ORAL | Status: DC

## 2013-05-10 NOTE — Progress Notes (Signed)
   Subjective:    Patient ID: [name], female    DOB: 30-Apr-1933, 80 y.o.   MRN: [id number]  Fever  This is a new problem. The current episode started yesterday. Her temperature was unmeasured prior to arrival. Associated symptoms include congestion, coughing and a sore throat. She has tried acetaminophen (Mucinex) for the symptoms. The treatment provided mild relief.   tmax yest 99.6  Developed frontal headache  Took tess perle for a cough  Helped some  Switched to something stronger the last time after zpk didn't help   Review of Systems  Constitutional: Positive for fever.  HENT: Positive for congestion and sore throat.   Respiratory: Positive for cough.        Objective:   Physical Exam  Alert hydration good. Mild malaise. Temp 100.8. HEENT moderate nasal congestion frontal tenderness. Pharynx normal neck supple. Lungs no crackles no tachypnea bronchial cough during exam.      Assessment & Plan:  Impression #1 rhinosinusitis/bronchitis plan antibiotics prescribed. Tessalon when necessary cough. Symptomatic care discussed. Warning signs discussed. WSL

## 2013-05-11 ENCOUNTER — Ambulatory Visit: Payer: Medicare Other | Admitting: Urology

## 2013-05-12 ENCOUNTER — Telehealth: Payer: Self-pay | Admitting: Family Medicine

## 2013-05-12 NOTE — Telephone Encounter (Signed)
Ok plus three ref for both

## 2013-05-12 NOTE — Telephone Encounter (Signed)
90 day supply of these two meds please  nateglinide (STARLIX) 120 MG tablet  Last filled 10/22/12  CREON 24000 UNITS CPEP last filled 11/07/12   Last seen 05/07/12

## 2013-05-13 ENCOUNTER — Other Ambulatory Visit: Payer: Self-pay | Admitting: *Deleted

## 2013-05-13 MED ORDER — NATEGLINIDE 120 MG PO TABS: 120.0000 mg | ORAL_TABLET | Freq: Three times a day (TID) | ORAL | Status: DC

## 2013-05-13 MED ORDER — PANCRELIPASE (LIP-PROT-AMYL) 24000-76000 UNITS PO CPEP: ORAL_CAPSULE | ORAL | Status: DC

## 2013-05-13 NOTE — Telephone Encounter (Signed)
[facility] Pt notified.

## 2013-05-17 ENCOUNTER — Telehealth: Payer: Self-pay | Admitting: Family Medicine

## 2013-05-17 MED ORDER — CEPHALEXIN 500 MG PO CAPS: 500.0000 mg | ORAL_CAPSULE | Freq: Three times a day (TID) | ORAL | Status: DC

## 2013-05-17 NOTE — Telephone Encounter (Signed)
Pt calling to say she has finished her zpak, is taking the tessalon pearls but they really  Are not helping, the [place] is helping at night and she wants to know if she can  Use that during the day as well? Or what would you recommend for her to use during the  Day? She is having bronchiale coughing spasms   [facility]   Seen: 05/10/13

## 2013-05-17 NOTE — Telephone Encounter (Signed)
[facility] to notify patient may use tussionex twice per day and keflex TID for 10 days was sent to pharm to cover for possible infection.

## 2013-05-17 NOTE — Telephone Encounter (Signed)
tussionex twice per d

## 2013-05-17 NOTE — Telephone Encounter (Signed)
Discussed with patient. Med sent to pharm.  

## 2013-05-17 NOTE — Telephone Encounter (Signed)
Also may have a little persist infxn keflex 500 tid ten d

## 2013-05-18 ENCOUNTER — Ambulatory Visit (INDEPENDENT_AMBULATORY_CARE_PROVIDER_SITE_OTHER): Payer: Medicare Other | Admitting: Urology

## 2013-05-18 DIAGNOSIS — N3010 Interstitial cystitis (chronic) without hematuria: Secondary | ICD-10-CM

## 2013-05-26 ENCOUNTER — Encounter: Payer: Self-pay | Admitting: Family Medicine

## 2013-05-26 ENCOUNTER — Ambulatory Visit (INDEPENDENT_AMBULATORY_CARE_PROVIDER_SITE_OTHER): Payer: Medicare Other | Admitting: Family Medicine

## 2013-05-26 VITALS — BP 122/82 | Temp 98.3°F | Ht 64.0 in | Wt 170.2 lb

## 2013-05-26 DIAGNOSIS — J310 Chronic rhinitis: Secondary | ICD-10-CM

## 2013-05-26 DIAGNOSIS — J329 Chronic sinusitis, unspecified: Secondary | ICD-10-CM

## 2013-05-26 DIAGNOSIS — N390 Urinary tract infection, site not specified: Secondary | ICD-10-CM

## 2013-05-26 MED ORDER — CIPROFLOXACIN HCL 500 MG PO TABS: 500.0000 mg | ORAL_TABLET | Freq: Two times a day (BID) | ORAL | Status: AC

## 2013-05-26 MED ORDER — HYDROCOD POLST-CHLORPHEN POLST 10-8 MG/5ML PO LQCR: 5.0000 mL | Freq: Every evening | ORAL | Status: DC | PRN

## 2013-05-26 MED ORDER — BENZONATATE 200 MG PO CAPS: 200.0000 mg | ORAL_CAPSULE | Freq: Three times a day (TID) | ORAL | Status: DC | PRN

## 2013-05-26 NOTE — Progress Notes (Signed)
   Subjective:    Patient ID: Deborah Gray, female    DOB: Jun 17, 1933, 80 y.o.   MRN: [id number]  HPI Patient stated she was seen last week and given Zpack and tessalon. Finished zpack and was still coughing and Monday started Keflex and Tussinex. Patient stated she also has had a flare of her diverticulitis and interstitial  cystitis and has been having urinary urgency. Patient states she still has a nagging cough and has almost finished Tussinex and Tessalon.  Pt noted some freq and didn't feel the best.  Lower left side felt discomfort  Pt wondered if diverticulitis was acting upm, went to liquid diet  Took it easy,  Still sig urgency and cough  Weaned self off the tusionex and had diminished energy  Bad cough, and dry hacky Still notes discharge and drainage.  Called dr wren's office,  they could not see her immediately.    Review of Systems  no high fevers no chills no majo no major change in bowel habits. ROS otherwise negativer back pain    Objective:   Physical Exam   alert mild malaise. Vital stable. Lungs clear. Heart regular in rhythm. H&T normal. Abdomen mild lobe domino tenderness.  Urinalysis 30-40 white blood cells per high-power field.       AssessmenImpression 1 UTI #2 rhinosinusitis/bronchitis plan Cipro 500 twice a day 10 days. Symptomatic care discussed. WSLt & Plan:

## 2013-06-18 ENCOUNTER — Ambulatory Visit (INDEPENDENT_AMBULATORY_CARE_PROVIDER_SITE_OTHER): Payer: Medicare Other | Admitting: Urology

## 2013-06-18 DIAGNOSIS — N3010 Interstitial cystitis (chronic) without hematuria: Secondary | ICD-10-CM | POA: Diagnosis not present

## 2013-07-15 ENCOUNTER — Telehealth: Payer: Self-pay | Admitting: Gastroenterology

## 2013-07-15 MED ORDER — CILIDINIUM-CHLORDIAZEPOXIDE 2.5-5 MG PO CAPS: 1.0000 | ORAL_CAPSULE | Freq: Three times a day (TID) | ORAL | Status: DC | PRN

## 2013-07-15 NOTE — Telephone Encounter (Signed)
Yes that's fine can refill x 2 months

## 2013-07-15 NOTE — Telephone Encounter (Signed)
Patient is requesting refill on Librax.  Patients last office visit was 10-15-2012. Is it okay to refill one time with zero refills? I will notify patient she will need to make an office visit in August for further refills

## 2013-07-16 ENCOUNTER — Ambulatory Visit (INDEPENDENT_AMBULATORY_CARE_PROVIDER_SITE_OTHER): Payer: 59 | Admitting: Urology

## 2013-07-16 DIAGNOSIS — N393 Stress incontinence (female) (male): Secondary | ICD-10-CM

## 2013-07-21 ENCOUNTER — Telehealth: Payer: Self-pay | Admitting: Family Medicine

## 2013-07-21 DIAGNOSIS — J329 Chronic sinusitis, unspecified: Secondary | ICD-10-CM

## 2013-07-21 NOTE — Telephone Encounter (Signed)
Pt called requesting referral to allergy specialist here in [place].  States was tested years ago, has lots of sinus problems, feels that getting retested and being treated or avoiding allergens would help greatly.  If OK, please initiate referral in system so that I may process.  Pt knows that Dr. Ishmael Holter is only in town on Tuesdays, requests late afternoon and after 08/21/13 due to busy summer schedule

## 2013-07-21 NOTE — Telephone Encounter (Signed)
Referral in the system.

## 2013-07-21 NOTE — Telephone Encounter (Signed)
Ok lets do 

## 2013-08-13 ENCOUNTER — Ambulatory Visit (INDEPENDENT_AMBULATORY_CARE_PROVIDER_SITE_OTHER): Payer: Medicare Other | Admitting: Urology

## 2013-08-13 DIAGNOSIS — N3010 Interstitial cystitis (chronic) without hematuria: Secondary | ICD-10-CM

## 2013-08-14 ENCOUNTER — Other Ambulatory Visit: Payer: Self-pay | Admitting: Family Medicine

## 2013-08-25 ENCOUNTER — Encounter: Payer: Self-pay | Admitting: Gastroenterology

## 2013-09-01 ENCOUNTER — Ambulatory Visit (INDEPENDENT_AMBULATORY_CARE_PROVIDER_SITE_OTHER): Payer: BC Managed Care – PPO | Admitting: Gastroenterology

## 2013-09-01 ENCOUNTER — Encounter: Payer: Self-pay | Admitting: Gastroenterology

## 2013-09-01 VITALS — BP 120/70 | HR 70 | Ht 64.0 in | Wt 170.0 lb

## 2013-09-01 DIAGNOSIS — R1013 Epigastric pain: Secondary | ICD-10-CM | POA: Diagnosis not present

## 2013-09-01 DIAGNOSIS — G8929 Other chronic pain: Secondary | ICD-10-CM

## 2013-09-01 MED ORDER — TRAMADOL HCL 50 MG PO TABS: 50.0000 mg | ORAL_TABLET | Freq: Three times a day (TID) | ORAL | Status: DC | PRN
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
## 2013-09-01 NOTE — Patient Instructions (Addendum)
Refills of tramadol called in. Return as needed to see Dr. Ardis Hughs.

## 2013-09-10 ENCOUNTER — Encounter: Payer: Self-pay | Admitting: Family Medicine

## 2013-09-10 ENCOUNTER — Ambulatory Visit (INDEPENDENT_AMBULATORY_CARE_PROVIDER_SITE_OTHER): Payer: Medicare Other | Admitting: Family Medicine

## 2013-09-10 VITALS — BP 142/100 | HR 86 | Temp 98.2°F | Ht 64.0 in | Wt 171.0 lb

## 2013-09-10 DIAGNOSIS — R002 Palpitations: Secondary | ICD-10-CM

## 2013-09-10 DIAGNOSIS — R42 Dizziness and giddiness: Secondary | ICD-10-CM

## 2013-09-10 LAB — POCT HEMOGLOBIN: Hemoglobin: 13.5 g/dL (ref 12.2–16.2)

## 2013-09-10 LAB — GLUCOSE, POCT (MANUAL RESULT ENTRY): POC Glucose: 179 mg/dl — AB (ref 70–99)

## 2013-09-10 NOTE — Progress Notes (Signed)
   Subjective:    Patient ID: Deborah Gray, female    DOB: December 09, 1933, 80 y.o.   MRN: [id number]  HPI Was walking around the [employer]  Went to rest room and felt a little weak  Felt palpiataions  Felt weak and lightheaded and dizzy and palpiations  Drove on slowly over here  Felt weak afterwards  Lightheaded and dizzy, felt need to sit down or may pass   No sig cramping, and feels pain mid abd ,  Patient's this was not spinning in nature, which usually is what she gets with tear. If felt more lightheaded. No true chest pain with this. No loss of consciousness.  No nausea no vomiting  Review of Systems No chest pain no abdominal no change about habits no rash no blood in stool    Objective:   Physical Exam Alert vital signs stable HEENT normal. Lungs clear. Heart regular rate and rhythm. Abdomen benign. No epigastric tenderness no rebound with a no distinct tenderness   EKG normal sinus rhythm no significant ST-T changes    Assessment & Plan:  Impression near syncopal lightheaded dizziness. After urinating. Also after significant working on a guarding and getting somewhat dehydrated at night before. No chest pain or pressure. Occasional sense of palpitations. Plan hold off on major cardiac workup at this time. Rationale discussed. Increase hydration. If symptoms persists will need further workup. Warning signs discussed. WSL

## 2013-09-17 ENCOUNTER — Telehealth: Payer: Self-pay | Admitting: Gastroenterology

## 2013-09-17 ENCOUNTER — Ambulatory Visit (INDEPENDENT_AMBULATORY_CARE_PROVIDER_SITE_OTHER): Payer: Medicare Other | Admitting: Urology

## 2013-09-17 DIAGNOSIS — N3010 Interstitial cystitis (chronic) without hematuria: Secondary | ICD-10-CM | POA: Diagnosis not present

## 2013-09-17 DIAGNOSIS — N3020 Other chronic cystitis without hematuria: Secondary | ICD-10-CM

## 2013-09-17 DIAGNOSIS — R109 Unspecified abdominal pain: Secondary | ICD-10-CM | POA: Diagnosis not present

## 2013-09-17 NOTE — Telephone Encounter (Signed)
rx was verified at the pharmacy and should be available for the pt

## 2013-10-08 ENCOUNTER — Encounter: Payer: Self-pay | Admitting: Family Medicine

## 2013-10-08 ENCOUNTER — Telehealth: Payer: Self-pay | Admitting: Family Medicine

## 2013-10-08 MED ORDER — CIPROFLOXACIN HCL 250 MG PO TABS: 250.0000 mg | ORAL_TABLET | Freq: Two times a day (BID) | ORAL | Status: DC

## 2013-10-08 NOTE — Telephone Encounter (Signed)
Pt is having another UTI and is unable to get into [facility] Can we call her in something?   Symptoms, frequent urination, burning, pulling  [facility]   She says Thank You Dr Richardson Landry from the bottom of her heart

## 2013-10-08 NOTE — Telephone Encounter (Signed)
Med sent to pharm. Pt notified.  

## 2013-10-08 NOTE — Telephone Encounter (Signed)
cipro 250 bid 7 d 

## 2013-10-12 ENCOUNTER — Other Ambulatory Visit: Payer: Self-pay | Admitting: Family Medicine

## 2013-10-12 DIAGNOSIS — Z1231 Encounter for screening mammogram for malignant neoplasm of breast: Secondary | ICD-10-CM

## 2013-10-15 ENCOUNTER — Ambulatory Visit (INDEPENDENT_AMBULATORY_CARE_PROVIDER_SITE_OTHER): Payer: Medicare Other | Admitting: Urology

## 2013-10-15 ENCOUNTER — Ambulatory Visit (INDEPENDENT_AMBULATORY_CARE_PROVIDER_SITE_OTHER): Payer: Medicare Other | Admitting: Family Medicine

## 2013-10-15 ENCOUNTER — Encounter: Payer: Self-pay | Admitting: Family Medicine

## 2013-10-15 VITALS — BP 140/86 | Ht 64.0 in | Wt 169.6 lb

## 2013-10-15 DIAGNOSIS — N3010 Interstitial cystitis (chronic) without hematuria: Secondary | ICD-10-CM

## 2013-10-15 DIAGNOSIS — R21 Rash and other nonspecific skin eruption: Secondary | ICD-10-CM

## 2013-10-15 MED ORDER — DOXYCYCLINE HYCLATE 100 MG PO TABS: 100.0000 mg | ORAL_TABLET | Freq: Two times a day (BID) | ORAL | Status: DC

## 2013-10-15 NOTE — Progress Notes (Signed)
   Subjective:    Patient ID: Deborah Gray, female    DOB: [birth year], 80 y.o.   MRN: [id number]  HPI  Patient arrives with a rash on her ankles- husband had a similar rash recently.  Rash for husb two wks ago, had chlorox and then antibiotic cream  Then developed a rash  Now pt has developed a similar rash  Review of Systems No fever no chills no headache    Objective:   Physical Exam   Alert no apparent distress lungs clear heart rare rhythm ankles discrete papules some with folliculitis-like appearance     Assessment & Plan:  Impression folliculitis secondarily infected rash plan Doxy twice a day. Local measures discussed. WSL

## 2013-10-18 ENCOUNTER — Ambulatory Visit (HOSPITAL_COMMUNITY)
Admission: RE | Admit: 2013-10-18 | Discharge: 2013-10-18 | Disposition: A | Discharge status: Home / Self Care | Payer: Medicare Other | Source: Ambulatory Visit | Attending: Family Medicine | Admitting: Family Medicine

## 2013-10-18 DIAGNOSIS — Z1231 Encounter for screening mammogram for malignant neoplasm of breast: Secondary | ICD-10-CM | POA: Diagnosis present

## 2013-11-01 ENCOUNTER — Other Ambulatory Visit: Payer: Self-pay | Admitting: Family Medicine

## 2013-11-03 ENCOUNTER — Other Ambulatory Visit: Payer: Self-pay | Admitting: *Deleted

## 2013-11-03 MED ORDER — EZETIMIBE 10 MG PO TABS: ORAL_TABLET | ORAL | Status: DC

## 2013-11-19 ENCOUNTER — Ambulatory Visit (INDEPENDENT_AMBULATORY_CARE_PROVIDER_SITE_OTHER): Payer: Medicare Other | Admitting: Urology

## 2013-11-19 DIAGNOSIS — N3010 Interstitial cystitis (chronic) without hematuria: Secondary | ICD-10-CM

## 2013-11-22 ENCOUNTER — Encounter: Payer: Self-pay | Admitting: Nurse Practitioner

## 2013-11-22 ENCOUNTER — Ambulatory Visit (INDEPENDENT_AMBULATORY_CARE_PROVIDER_SITE_OTHER): Payer: Medicare Other | Admitting: Nurse Practitioner

## 2013-11-22 VITALS — BP 134/82 | Ht 64.0 in | Wt 168.0 lb

## 2013-11-22 DIAGNOSIS — Z23 Encounter for immunization: Secondary | ICD-10-CM

## 2013-11-22 DIAGNOSIS — Z0000 Encounter for general adult medical examination without abnormal findings: Secondary | ICD-10-CM

## 2013-11-22 DIAGNOSIS — R079 Chest pain, unspecified: Secondary | ICD-10-CM

## 2013-11-22 DIAGNOSIS — N819 Female genital prolapse, unspecified: Secondary | ICD-10-CM

## 2013-11-22 DIAGNOSIS — Z01419 Encounter for gynecological examination (general) (routine) without abnormal findings: Secondary | ICD-10-CM

## 2013-11-22 DIAGNOSIS — E119 Type 2 diabetes mellitus without complications: Secondary | ICD-10-CM

## 2013-11-22 DIAGNOSIS — R0609 Other forms of dyspnea: Secondary | ICD-10-CM

## 2013-11-22 DIAGNOSIS — Z79899 Other long term (current) drug therapy: Secondary | ICD-10-CM

## 2013-11-23 LAB — HEPATIC FUNCTION PANEL
ALBUMIN: 4.4 g/dL (ref 3.5–5.2)
ALT: 12 U/L (ref 0–35)
AST: 12 U/L (ref 0–37)
Alkaline Phosphatase: 58 U/L (ref 39–117)
BILIRUBIN INDIRECT: 0.6 mg/dL (ref 0.2–1.2)
BILIRUBIN TOTAL: 0.7 mg/dL (ref 0.2–1.2)
Bilirubin, Direct: 0.1 mg/dL (ref 0.0–0.3)
Total Protein: 6.8 g/dL (ref 6.0–8.3)

## 2013-11-23 LAB — BASIC METABOLIC PANEL
BUN: 23 mg/dL (ref 6–23)
CHLORIDE: 104 meq/L (ref 96–112)
CO2: 24 meq/L (ref 19–32)
Calcium: 9.6 mg/dL (ref 8.4–10.5)
Creat: 0.78 mg/dL (ref 0.50–1.10)
Glucose, Bld: 128 mg/dL — ABNORMAL HIGH (ref 70–99)
Potassium: 4.6 mEq/L (ref 3.5–5.3)
Sodium: 140 mEq/L (ref 135–145)

## 2013-11-23 LAB — LIPID PANEL
Cholesterol: 183 mg/dL (ref 0–200)
HDL: 51 mg/dL (ref >39)
LDL CALC: 103 mg/dL — AB (ref 0–99)
Total CHOL/HDL Ratio: 3.6 Ratio
Triglycerides: 146 mg/dL (ref <150)
VLDL: 29 mg/dL (ref 0–40)

## 2013-11-23 LAB — HEMOGLOBIN A1C
Hgb A1c MFr Bld: 6.1 % — ABNORMAL HIGH (ref <5.7)
MEAN PLASMA GLUCOSE: 128 mg/dL — AB (ref <117)

## 2013-11-24 ENCOUNTER — Encounter: Payer: Self-pay | Admitting: Nurse Practitioner

## 2013-11-24 LAB — MICROALBUMIN, URINE: Microalb, Ur: 1 mg/dL (ref <2.0)

## 2013-11-25 ENCOUNTER — Other Ambulatory Visit: Payer: Self-pay | Admitting: Family Medicine

## 2013-11-25 ENCOUNTER — Encounter: Payer: Self-pay | Admitting: Cardiology

## 2013-11-25 ENCOUNTER — Ambulatory Visit (INDEPENDENT_AMBULATORY_CARE_PROVIDER_SITE_OTHER): Payer: Medicare Other | Admitting: Cardiology

## 2013-11-25 VITALS — BP 136/58 | HR 60 | Ht 64.0 in | Wt 168.0 lb

## 2013-11-25 DIAGNOSIS — R072 Precordial pain: Secondary | ICD-10-CM | POA: Insufficient documentation

## 2013-11-25 DIAGNOSIS — I10 Essential (primary) hypertension: Secondary | ICD-10-CM

## 2013-11-25 DIAGNOSIS — R011 Cardiac murmur, unspecified: Secondary | ICD-10-CM | POA: Insufficient documentation
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
## 2013-11-25 NOTE — Patient Instructions (Signed)
Your physician recommends that you schedule a follow-up appointment in: to be determined after tests.We will call you with results   Your physician recommends that you continue on your current medications as directed. Please refer to the Current Medication list given to you today.   Your physician has requested that you have an echocardiogram. Echocardiography is a painless test that uses sound waves to create images of your heart. It provides your doctor with information about the size and shape of your heart and how well your heart's chambers and valves are working. This procedure takes approximately one hour. There are no restrictions for this procedure.   Your physician has requested that you have a lexiscan myoview. For further information please visit [email or web address]. Please follow instruction sheet, as given.       Thank you for choosing  [facility] !   

## 2013-11-25 NOTE — Assessment & Plan Note (Signed)
Followed by Dr. Wolfgang Phoenix, on Diovan.

## 2013-11-25 NOTE — Assessment & Plan Note (Signed)
States that she had rheumatic fever as a child. Echocardiogram will be obtained to assess valvular structure and function, particularly in light of her recent symptoms.

## 2013-11-25 NOTE — Assessment & Plan Note (Signed)
Exertional within the last few weeks, also associated with shortness of breath. Not entirely similar to her typical GI symptoms of bloating and epigastric discomfort. Recent ECG reviewed as outlined. She does have cardiac risk factors including type 2 diabetes mellitus, hyperlipidemia, and hypertension. No ischemic testing in the last 10 years. Plan is to proceed with a Lexiscan Cardiolite for further objective evaluation.

## 2013-11-28 ENCOUNTER — Encounter: Payer: Self-pay | Admitting: Nurse Practitioner

## 2013-11-28 DIAGNOSIS — N819 Female genital prolapse, unspecified: Secondary | ICD-10-CM | POA: Insufficient documentation
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
## 2013-12-06 ENCOUNTER — Encounter (HOSPITAL_COMMUNITY)
Admission: RE | Admit: 2013-12-06 | Discharge: 2013-12-06 | Disposition: A | Discharge status: Home / Self Care | Payer: Medicare Other | Source: Ambulatory Visit | Attending: Cardiology | Admitting: Cardiology

## 2013-12-06 ENCOUNTER — Ambulatory Visit (HOSPITAL_COMMUNITY)
Admission: RE | Admit: 2013-12-06 | Discharge: 2013-12-06 | Disposition: A | Discharge status: Home / Self Care | Payer: Medicare Other | Source: Ambulatory Visit | Attending: Cardiology | Admitting: Cardiology

## 2013-12-06 ENCOUNTER — Encounter (HOSPITAL_COMMUNITY): Payer: Self-pay

## 2013-12-06 DIAGNOSIS — K219 Gastro-esophageal reflux disease without esophagitis: Secondary | ICD-10-CM | POA: Diagnosis not present

## 2013-12-06 DIAGNOSIS — E785 Hyperlipidemia, unspecified: Secondary | ICD-10-CM | POA: Insufficient documentation

## 2013-12-06 DIAGNOSIS — I10 Essential (primary) hypertension: Secondary | ICD-10-CM | POA: Diagnosis not present

## 2013-12-06 DIAGNOSIS — I081 Rheumatic disorders of both mitral and tricuspid valves: Secondary | ICD-10-CM | POA: Diagnosis not present

## 2013-12-06 DIAGNOSIS — I517 Cardiomegaly: Secondary | ICD-10-CM

## 2013-12-06 DIAGNOSIS — R011 Cardiac murmur, unspecified: Secondary | ICD-10-CM | POA: Diagnosis present

## 2013-12-06 DIAGNOSIS — R072 Precordial pain: Secondary | ICD-10-CM

## 2013-12-06 DIAGNOSIS — R0602 Shortness of breath: Secondary | ICD-10-CM | POA: Insufficient documentation

## 2013-12-06 DIAGNOSIS — R0789 Other chest pain: Secondary | ICD-10-CM | POA: Diagnosis present

## 2013-12-06 DIAGNOSIS — E119 Type 2 diabetes mellitus without complications: Secondary | ICD-10-CM | POA: Insufficient documentation

## 2013-12-06 DIAGNOSIS — R079 Chest pain, unspecified: Secondary | ICD-10-CM | POA: Insufficient documentation

## 2013-12-06 MED ORDER — REGADENOSON 0.4 MG/5ML IV SOLN
INTRAVENOUS | Status: AC
  Administered 2013-12-06: 0.4 mg via INTRAVENOUS
  Filled 2013-12-06: qty 5

## 2013-12-06 MED ORDER — TECHNETIUM TC 99M SESTAMIBI - CARDIOLITE
30.0000 | Freq: Once | INTRAVENOUS | Status: AC | PRN
  Administered 2013-12-06: 30 via INTRAVENOUS

## 2013-12-06 MED ORDER — SODIUM CHLORIDE 0.9 % IJ SOLN
INTRAMUSCULAR | Status: AC
  Administered 2013-12-06: 10 mL via INTRAVENOUS
  Filled 2013-12-06: qty 10

## 2013-12-06 MED ORDER — SODIUM CHLORIDE 0.9 % IJ SOLN
10.0000 mL | INTRAMUSCULAR | Status: DC | PRN
  Administered 2013-12-06: 10 mL via INTRAVENOUS

## 2013-12-06 MED ORDER — REGADENOSON 0.4 MG/5ML IV SOLN
0.4000 mg | Freq: Once | INTRAVENOUS | Status: AC | PRN
  Administered 2013-12-06: 0.4 mg via INTRAVENOUS

## 2013-12-06 MED ORDER — TECHNETIUM TC 99M SESTAMIBI GENERIC - CARDIOLITE
10.0000 | Freq: Once | INTRAVENOUS | Status: AC | PRN
  Administered 2013-12-06: 10 via INTRAVENOUS

## 2013-12-06 NOTE — Progress Notes (Signed)
Stress Lab Nurses Notes - Deborah Gray 12/06/2013 Reason for doing test: chest pain Type of test: Wille Glaser Nurse performing test: Gerrit Halls, RN Nuclear Medicine Tech: Melburn Hake Echo Tech: Not Applicable MD performing test: S. McDowell/K.Purcell Nails NP Family MD: Mickie Hillier Test explained and consent signed: Yes.   IV started: Saline lock flushed, No redness or edema and Saline lock started in radiology Symptoms: chest pressure & stomach discomfort Treatment/Intervention: None Reason test stopped: protocol completed After recovery IV was: Discontinued via X-ray tech and No redness or edema Patient to return to Nuc. Med at : 12:00 Patient discharged: Home Patient's Condition upon discharge was: stable Comments: During test BP 145/61 & HR 90.  Recovery BP 156/71 & HR 80.  Symptoms resolved in recovery. [name] T

## 2013-12-06 NOTE — Progress Notes (Signed)
  Echocardiogram 2D Echocardiogram has been performed.  [place], [place] 12/06/2013, 12:22 PM

## 2013-12-24 ENCOUNTER — Telehealth: Payer: Self-pay | Admitting: Family Medicine

## 2013-12-24 ENCOUNTER — Other Ambulatory Visit: Payer: Self-pay | Admitting: *Deleted

## 2013-12-24 ENCOUNTER — Ambulatory Visit (INDEPENDENT_AMBULATORY_CARE_PROVIDER_SITE_OTHER): Payer: Medicare Other | Admitting: Urology

## 2013-12-24 DIAGNOSIS — N3010 Interstitial cystitis (chronic) without hematuria: Secondary | ICD-10-CM

## 2013-12-24 MED ORDER — BENZONATATE 200 MG PO CAPS: 200.0000 mg | ORAL_CAPSULE | Freq: Three times a day (TID) | ORAL | Status: DC | PRN

## 2013-12-24 NOTE — Telephone Encounter (Signed)
We can do reg ref plus one ref

## 2013-12-24 NOTE — Telephone Encounter (Signed)
Med sent to pharm. Pt notified.  

## 2013-12-24 NOTE — Telephone Encounter (Signed)
benzonatate (TESSALON) 200 MG capsul  Pt would like a refill on this med so she can keep it on hand for when she  Catches a tickle in her throat  She wants to know if she can just get a 90 day supply on this med, I advised this  Was a temporary cough med not a on going treatment they may not do 90 day supply.   [facility]

## 2014-01-18 ENCOUNTER — Ambulatory Visit: Payer: Medicare Other | Admitting: Urology

## 2014-01-20 LAB — HM DIABETES EYE EXAM

## 2014-01-28 ENCOUNTER — Ambulatory Visit (INDEPENDENT_AMBULATORY_CARE_PROVIDER_SITE_OTHER): Payer: Medicare Other | Admitting: Urology

## 2014-01-28 DIAGNOSIS — N3010 Interstitial cystitis (chronic) without hematuria: Secondary | ICD-10-CM

## 2014-01-28 DIAGNOSIS — N3020 Other chronic cystitis without hematuria: Secondary | ICD-10-CM

## 2014-02-07 ENCOUNTER — Other Ambulatory Visit: Payer: Self-pay | Admitting: Family Medicine

## 2014-02-09 ENCOUNTER — Encounter: Payer: Self-pay | Admitting: Family Medicine

## 2014-02-09 ENCOUNTER — Ambulatory Visit (INDEPENDENT_AMBULATORY_CARE_PROVIDER_SITE_OTHER): Payer: Medicare Other | Admitting: Family Medicine

## 2014-02-09 ENCOUNTER — Other Ambulatory Visit: Payer: Self-pay | Admitting: Family Medicine

## 2014-02-09 VITALS — BP 140/78 | Temp 98.4°F | Ht 64.0 in | Wt 172.0 lb

## 2014-02-09 DIAGNOSIS — J310 Chronic rhinitis: Secondary | ICD-10-CM

## 2014-02-09 DIAGNOSIS — J329 Chronic sinusitis, unspecified: Secondary | ICD-10-CM

## 2014-02-09 MED ORDER — METFORMIN HCL 500 MG PO TABS: 500.0000 mg | ORAL_TABLET | Freq: Two times a day (BID) | ORAL | Status: DC

## 2014-02-09 MED ORDER — CEPHALEXIN 500 MG PO CAPS: 500.0000 mg | ORAL_CAPSULE | Freq: Three times a day (TID) | ORAL | Status: DC

## 2014-02-09 NOTE — Patient Instructions (Signed)
Follow up in April for a diabetes check up++++++++++++++++++++++++++++++++++++++++++++++++++++++++++++++++++++++++++++++++++++++++++++++++++++++++++++++++++++++++++++++++++++++++++++++

## 2014-02-09 NOTE — Progress Notes (Signed)
   Subjective:    Patient ID: Deborah Gray, female    DOB: 1933-11-02, 80 y.o.   MRN: [id number]  Sinusitis This is a new problem. The current episode started in the past 7 days. The problem is unchanged. There has been no fever. Her pain is at a severity of 0/10. The pain is moderate. Associated symptoms include congestion. Past treatments include oral decongestants. The treatment provided no relief.   Patient states she has no other concerns at this time.   Dental fracture and b-down a couple weeks ago, and now on new partial  Plain mucinex for expectorant   For congestion  yest had stitches out Tenderness and discomfort Took four amoxavillines  Now noting cong and drainage  This morn cong and drainage and pressure and diminishede energy  No sickg kids lately, pos exp to sickness thru church and friend s  m   Review of Systems  HENT: Positive for congestion.    No vomiting no diarrhea no rash    Objective:   Physical Exam  Alert hydration good vitals stable. HEENT moderate nasal congestion frontal tenderness. Pharynx erythema. Lungs clear. Heart regular in rhythm.      Assessment & Plan:  Impression acute rhinosinusitis plan antibiotics prescribed. Since Medicare discussed. WSL

## 2014-02-23 ENCOUNTER — Other Ambulatory Visit: Payer: Self-pay | Admitting: Nurse Practitioner

## 2014-02-23 ENCOUNTER — Encounter: Payer: Self-pay | Admitting: Family Medicine

## 2014-02-23 ENCOUNTER — Ambulatory Visit (INDEPENDENT_AMBULATORY_CARE_PROVIDER_SITE_OTHER): Payer: Medicare Other | Admitting: Family Medicine

## 2014-02-23 ENCOUNTER — Other Ambulatory Visit: Payer: Self-pay | Admitting: *Deleted

## 2014-02-23 VITALS — BP 136/86 | Ht 64.0 in | Wt 170.4 lb

## 2014-02-23 DIAGNOSIS — J329 Chronic sinusitis, unspecified: Secondary | ICD-10-CM

## 2014-02-23 DIAGNOSIS — J310 Chronic rhinitis: Secondary | ICD-10-CM

## 2014-02-23 MED ORDER — TIZANIDINE HCL 2 MG PO CAPS: 2.0000 mg | ORAL_CAPSULE | Freq: Two times a day (BID) | ORAL | Status: DC | PRN

## 2014-02-23 MED ORDER — METFORMIN HCL 500 MG PO TABS: 500.0000 mg | ORAL_TABLET | Freq: Two times a day (BID) | ORAL | Status: DC

## 2014-02-23 MED ORDER — LEVOFLOXACIN 500 MG PO TABS: 500.0000 mg | ORAL_TABLET | Freq: Every day | ORAL | Status: AC

## 2014-02-23 NOTE — Progress Notes (Signed)
   Subjective:    Patient ID: Deborah Gray, female    DOB: 30-Apr-1933, 81 y.o.   MRN: [id number]  HPI Patient arrives with complaint of neck spasms and pain -going into left shoulder since Christmas day. Patient using Tylenol for pain. Tried to lift a piece of heavy furniture. Developed left supraclavicular pain. Still tender times with certain motion. Next  Ongoing headache frontal congestion. Sore throat diminished energy occasional cough. Just finished round of antibiotics.   Review of Systems  no high fever no chills no headache no abdominal pain no chest pain ROS otherwise negative    Objective:   Physical Exam   alert no acute distress vital stable HET moderate nasal congestion frontal tenderness pharynx normal neck supple lungs clear heart regular in rhythm. Left supraclavicular and left  Perryscapular tenderness      Assessment & Plan:   impression 1 persistent rhinosinusitis #2 muscle strain with element of spasm plan local measures discussed. Antibiotics prescribed. Zanaflex twice a day. Ibuprofen twice a day. WSL

## 2014-03-04 ENCOUNTER — Ambulatory Visit (INDEPENDENT_AMBULATORY_CARE_PROVIDER_SITE_OTHER): Payer: Medicare Other | Admitting: Urology

## 2014-03-04 DIAGNOSIS — N3020 Other chronic cystitis without hematuria: Secondary | ICD-10-CM | POA: Diagnosis not present

## 2014-03-10 ENCOUNTER — Encounter: Payer: Self-pay | Admitting: *Deleted

## 2014-04-05 ENCOUNTER — Ambulatory Visit (INDEPENDENT_AMBULATORY_CARE_PROVIDER_SITE_OTHER): Payer: Medicare Other | Admitting: Urology

## 2014-04-05 DIAGNOSIS — N3010 Interstitial cystitis (chronic) without hematuria: Secondary | ICD-10-CM

## 2014-05-06 ENCOUNTER — Ambulatory Visit (INDEPENDENT_AMBULATORY_CARE_PROVIDER_SITE_OTHER): Payer: Medicare Other | Admitting: Urology

## 2014-05-06 DIAGNOSIS — N3020 Other chronic cystitis without hematuria: Secondary | ICD-10-CM | POA: Diagnosis not present

## 2014-05-06 DIAGNOSIS — N3010 Interstitial cystitis (chronic) without hematuria: Secondary | ICD-10-CM | POA: Diagnosis not present

## 2014-05-31 ENCOUNTER — Telehealth: Payer: Self-pay | Admitting: Family Medicine

## 2014-05-31 DIAGNOSIS — E785 Hyperlipidemia, unspecified: Secondary | ICD-10-CM

## 2014-05-31 DIAGNOSIS — E119 Type 2 diabetes mellitus without complications: Secondary | ICD-10-CM

## 2014-05-31 DIAGNOSIS — Z79899 Other long term (current) drug therapy: Secondary | ICD-10-CM

## 2014-05-31 NOTE — Telephone Encounter (Signed)
Pt is needing lab work orders sent over for her appointment coming up  On 06/08/14. Last labs were lipid,hepatic,bmp,microalbumin,a1c on 11/22/13

## 2014-06-01 ENCOUNTER — Other Ambulatory Visit: Payer: Self-pay | Admitting: Family Medicine

## 2014-06-01 NOTE — Addendum Note (Signed)
Addended by: Carmelina Noun on: 06/01/2014 02:53 PM   Modules accepted: Orders

## 2014-06-01 NOTE — Telephone Encounter (Signed)
bw orders ready. Pt notified on voicemail.  

## 2014-06-01 NOTE — Telephone Encounter (Signed)
Lip liv A1c 

## 2014-06-01 NOTE — Addendum Note (Signed)
Addended by: Carmelina Noun on: 06/01/2014 03:03 PM   Modules accepted: Orders

## 2014-06-03 LAB — HEPATIC FUNCTION PANEL
ALBUMIN: 4.5 g/dL (ref 3.5–4.8)
ALT: 14 IU/L (ref 0–32)
AST: 13 IU/L (ref 0–40)
Alkaline Phosphatase: 60 IU/L (ref 39–117)
BILIRUBIN TOTAL: 0.5 mg/dL (ref 0.0–1.2)
Bilirubin, Direct: 0.11 mg/dL (ref 0.00–0.40)
Total Protein: 7.1 g/dL (ref 6.0–8.5)

## 2014-06-03 LAB — LIPID PANEL
CHOL/HDL RATIO: 3.7 ratio (ref 0.0–4.4)
Cholesterol, Total: 206 mg/dL — ABNORMAL HIGH (ref 100–199)
HDL: 56 mg/dL (ref >39)
LDL CALC: 121 mg/dL — AB (ref 0–99)
Triglycerides: 145 mg/dL (ref 0–149)
VLDL Cholesterol Cal: 29 mg/dL (ref 5–40)

## 2014-06-03 LAB — HEMOGLOBIN A1C
Est. average glucose Bld gHb Est-mCnc: 148 mg/dL
HEMOGLOBIN A1C: 6.8 % — AB (ref 4.8–5.6)

## 2014-06-07 ENCOUNTER — Ambulatory Visit (INDEPENDENT_AMBULATORY_CARE_PROVIDER_SITE_OTHER): Payer: Medicare Other | Admitting: Urology

## 2014-06-07 DIAGNOSIS — N3010 Interstitial cystitis (chronic) without hematuria: Secondary | ICD-10-CM | POA: Diagnosis not present

## 2014-06-07 DIAGNOSIS — N3020 Other chronic cystitis without hematuria: Secondary | ICD-10-CM | POA: Diagnosis not present

## 2014-06-08 ENCOUNTER — Ambulatory Visit (INDEPENDENT_AMBULATORY_CARE_PROVIDER_SITE_OTHER): Payer: Medicare Other | Admitting: Family Medicine

## 2014-06-08 ENCOUNTER — Encounter: Payer: Self-pay | Admitting: Family Medicine

## 2014-06-08 VITALS — BP 130/80 | Ht 64.0 in | Wt 171.1 lb

## 2014-06-08 DIAGNOSIS — I10 Essential (primary) hypertension: Secondary | ICD-10-CM

## 2014-06-08 DIAGNOSIS — E785 Hyperlipidemia, unspecified: Secondary | ICD-10-CM

## 2014-06-08 DIAGNOSIS — E119 Type 2 diabetes mellitus without complications: Secondary | ICD-10-CM | POA: Diagnosis not present

## 2014-06-08 DIAGNOSIS — E782 Mixed hyperlipidemia: Secondary | ICD-10-CM | POA: Diagnosis not present

## 2014-06-08 MED ORDER — METFORMIN HCL 500 MG PO TABS: 500.0000 mg | ORAL_TABLET | Freq: Two times a day (BID) | ORAL | Status: DC

## 2014-06-08 MED ORDER — ISOMETHEPTENE-DICHLORAL-APAP 65-100-325 MG PO CAPS: ORAL_CAPSULE | ORAL | Status: DC

## 2014-06-08 MED ORDER — VALSARTAN 80 MG PO TABS: ORAL_TABLET | ORAL | Status: DC

## 2014-06-08 MED ORDER — FLUTICASONE PROPIONATE 50 MCG/ACT NA SUSP: 2.0000 | Freq: Every day | NASAL | Status: DC

## 2014-06-08 MED ORDER — EZETIMIBE 10 MG PO TABS: ORAL_TABLET | ORAL | Status: DC

## 2014-06-08 MED ORDER — PANCRELIPASE (LIP-PROT-AMYL) 24000-76000 UNITS PO CPEP
ORAL_CAPSULE | ORAL | Status: DC
Start: 2014-06-08 — End: 2015-04-18

## 2014-06-08 NOTE — Progress Notes (Signed)
   Subjective:    Patient ID: Deborah Gray, female    DOB: [birth year], 81 y.o.   MRN: [id number]  Diabetes She presents for her follow-up diabetic visit. She has type 2 diabetes mellitus. Her disease course has been stable. There are no hypoglycemic associated symptoms. There are no diabetic associated symptoms. There are no hypoglycemic complications. Symptoms are stable. There are no diabetic complications. There are no known risk factors for coronary artery disease. Current diabetic treatment includes oral agent (monotherapy). She is compliant with treatment all of the time.   Patient wants to know if it is ok for her to continue to take Xyzal daily for her allergies. Overall has helped her allergies. Less congestion was drainage. Has helped itching 2.  Results for orders placed or performed in visit on 05/31/14  Lipid panel  Result Value Ref Range   Cholesterol, Total 206 (H) 100 - 199 mg/dL   Triglycerides 145 0 - 149 mg/dL   HDL 56 >39 mg/dL   VLDL Cholesterol Cal 29 5 - 40 mg/dL   LDL Calculated 121 (H) 0 - 99 mg/dL   Chol/HDL Ratio 3.7 0.0 - 4.4 ratio units  Hepatic function panel  Result Value Ref Range   Total Protein 7.1 6.0 - 8.5 g/dL   Albumin 4.5 3.5 - 4.8 g/dL   Bilirubin Total 0.5 0.0 - 1.2 mg/dL   Bilirubin, Direct 0.11 0.00 - 0.40 mg/dL   Alkaline Phosphatase 60 39 - 117 IU/L   AST 13 0 - 40 IU/L   ALT 14 0 - 32 IU/L  Hemoglobin A1c  Result Value Ref Range   Hgb A1c MFr Bld 6.8 (H) 4.8 - 5.6 %   Est. average glucose Bld gHb Est-mCnc 148 mg/dL   Itching and given  Blood work results in the system. A1C included.   Sugar came up during the holidays, numbers went up  A m numbers excellent the past couple months  Still exercising with bicycle five times per wk  Compliant with lipid medication. No obvious side effects. Cut the fat intake down.  Review of Systems No headache no chest pain no back pain no abdominal pain no change in bowel habits no blood in  stool ROS otherwise negative    Objective:   Physical Exam Alert no acute distress. HEENT normal neck supple. Lungs clear. Heart rare rhythm. Ankles without edema.       Assessment & Plan:  Impression 1 type 2 diabetes overall still good control #2 hyperlipidemia sufficient control discussed #3 allergic rhinitis, rash skin allergies improved on meds #4 hypertension good control plan diet discussed exercise discussed. Maintain on current medications. Recheck in 6 months

## 2014-06-14 ENCOUNTER — Other Ambulatory Visit: Payer: Self-pay | Admitting: Family Medicine

## 2014-06-14 ENCOUNTER — Telehealth: Payer: Self-pay | Admitting: Gastroenterology

## 2014-06-16 NOTE — Telephone Encounter (Signed)
Pt was given Dr Ardis Hughs recommendations to see the pain clinic prior to any further appts.(Assessment and Plan: Suspected idiopathic chronic pancreatitis although I think her abdominal pain is functional in nature. I have renewed her tramadol 50 mg every 6-8 hours and when necessary ,Librax, every 6-8 hours as needed for abdominal spasms, but if these medications do not help, I have told her that I will not see her in the future before she is evaluated at pain clinic)    Pt states she will call her PCP and see what they recommend

## 2014-06-16 NOTE — Telephone Encounter (Signed)
Left message on machine to call back  

## 2014-06-30 ENCOUNTER — Encounter: Payer: Self-pay | Admitting: Gastroenterology

## 2014-06-30 ENCOUNTER — Telehealth: Payer: Self-pay | Admitting: Family Medicine

## 2014-06-30 NOTE — Telephone Encounter (Signed)
Pt is wanting Dr. Richardson Landry to call her sometime after 2pm. Pt wants to speak to him regarding  A stomach issue she has. Pt's regular GI doctor retired and is now seeing a new GI doctor  That wants her to go to a pain management doctor. Pt wants to speak to Dr. Richardson Landry to see what he  Recommends her doing.

## 2014-07-04 NOTE — Telephone Encounter (Signed)
i generally don't talk pain management over the phone too complicated rec o v to disc

## 2014-07-04 NOTE — Telephone Encounter (Signed)
Discussed with pt. Pt will schedule office visit with Dr.Steve

## 2014-07-12 ENCOUNTER — Ambulatory Visit (INDEPENDENT_AMBULATORY_CARE_PROVIDER_SITE_OTHER): Payer: Medicare Other | Admitting: Urology

## 2014-07-12 DIAGNOSIS — N3010 Interstitial cystitis (chronic) without hematuria: Secondary | ICD-10-CM | POA: Diagnosis not present

## 2014-08-09 ENCOUNTER — Ambulatory Visit: Payer: Medicare Other | Admitting: Urology

## 2014-08-16 ENCOUNTER — Ambulatory Visit (INDEPENDENT_AMBULATORY_CARE_PROVIDER_SITE_OTHER): Payer: Medicare Other | Admitting: Urology

## 2014-08-16 DIAGNOSIS — N3010 Interstitial cystitis (chronic) without hematuria: Secondary | ICD-10-CM | POA: Diagnosis not present

## 2014-09-16 ENCOUNTER — Ambulatory Visit (INDEPENDENT_AMBULATORY_CARE_PROVIDER_SITE_OTHER): Payer: Medicare Other | Admitting: Urology

## 2014-09-16 DIAGNOSIS — N3010 Interstitial cystitis (chronic) without hematuria: Secondary | ICD-10-CM

## 2014-09-16 DIAGNOSIS — N3020 Other chronic cystitis without hematuria: Secondary | ICD-10-CM | POA: Diagnosis not present

## 2014-09-19 ENCOUNTER — Telehealth: Payer: Self-pay | Admitting: Family Medicine

## 2014-09-19 DIAGNOSIS — R109 Unspecified abdominal pain: Secondary | ICD-10-CM

## 2014-09-19 DIAGNOSIS — K649 Unspecified hemorrhoids: Secondary | ICD-10-CM

## 2014-09-19 NOTE — Telephone Encounter (Signed)
Gi ref to another group per pt wishes

## 2014-09-19 NOTE — Telephone Encounter (Signed)
Referral placed in Epic. Patient notified.

## 2014-09-19 NOTE — Telephone Encounter (Signed)
Pt is wanting a referral to a gi that is local. Pt is currently seeing a dr at [facility] in [place]. Pt states that she is having stomach pains and the dr there isn't concerned and is wanting a different dr. Pt states that the dr there said he wont see her until she went to a pain management dr. The pt states that she only takes the meds when she is in pain.

## 2014-09-19 NOTE — Telephone Encounter (Signed)
Patient also would like a referral to [facility] for hemorrhoids.

## 2014-09-19 NOTE — Telephone Encounter (Signed)
geez ok 

## 2014-09-19 NOTE — Addendum Note (Signed)
Addended by: Dairl Ponder on: 09/19/2014 04:44 PM   Modules accepted: Orders

## 2014-09-26 ENCOUNTER — Encounter: Payer: Self-pay | Admitting: Family Medicine

## 2014-09-27 ENCOUNTER — Other Ambulatory Visit: Payer: Self-pay | Admitting: Family Medicine

## 2014-10-08 ENCOUNTER — Encounter (HOSPITAL_COMMUNITY): Payer: Self-pay

## 2014-10-08 ENCOUNTER — Emergency Department (HOSPITAL_COMMUNITY): Payer: Medicare Other

## 2014-10-08 ENCOUNTER — Emergency Department (HOSPITAL_COMMUNITY)
Admission: EM | Admit: 2014-10-08 | Discharge: 2014-10-08 | Disposition: A | Discharge status: Home / Self Care | Payer: Medicare Other | Attending: Emergency Medicine | Admitting: Emergency Medicine

## 2014-10-08 DIAGNOSIS — Z7982 Long term (current) use of aspirin: Secondary | ICD-10-CM | POA: Diagnosis not present

## 2014-10-08 DIAGNOSIS — R011 Cardiac murmur, unspecified: Secondary | ICD-10-CM | POA: Diagnosis not present

## 2014-10-08 DIAGNOSIS — I10 Essential (primary) hypertension: Secondary | ICD-10-CM | POA: Diagnosis not present

## 2014-10-08 DIAGNOSIS — R110 Nausea: Secondary | ICD-10-CM | POA: Diagnosis not present

## 2014-10-08 DIAGNOSIS — Z86018 Personal history of other benign neoplasm: Secondary | ICD-10-CM | POA: Diagnosis not present

## 2014-10-08 DIAGNOSIS — Z792 Long term (current) use of antibiotics: Secondary | ICD-10-CM | POA: Insufficient documentation

## 2014-10-08 DIAGNOSIS — M1990 Unspecified osteoarthritis, unspecified site: Secondary | ICD-10-CM | POA: Diagnosis not present

## 2014-10-08 DIAGNOSIS — R109 Unspecified abdominal pain: Secondary | ICD-10-CM

## 2014-10-08 DIAGNOSIS — E119 Type 2 diabetes mellitus without complications: Secondary | ICD-10-CM | POA: Diagnosis not present

## 2014-10-08 DIAGNOSIS — R1010 Upper abdominal pain, unspecified: Secondary | ICD-10-CM | POA: Diagnosis not present

## 2014-10-08 DIAGNOSIS — Z7952 Long term (current) use of systemic steroids: Secondary | ICD-10-CM | POA: Diagnosis not present

## 2014-10-08 DIAGNOSIS — Z8719 Personal history of other diseases of the digestive system: Secondary | ICD-10-CM | POA: Insufficient documentation

## 2014-10-08 LAB — COMPREHENSIVE METABOLIC PANEL
ALT: 16 U/L (ref 14–54)
ANION GAP: 10 (ref 5–15)
AST: 21 U/L (ref 15–41)
Albumin: 4.2 g/dL (ref 3.5–5.0)
Alkaline Phosphatase: 54 U/L (ref 38–126)
BUN: 30 mg/dL — ABNORMAL HIGH (ref 6–20)
CHLORIDE: 104 mmol/L (ref 101–111)
CO2: 24 mmol/L (ref 22–32)
Calcium: 9.3 mg/dL (ref 8.9–10.3)
Creatinine, Ser: 0.83 mg/dL (ref 0.44–1.00)
Glucose, Bld: 186 mg/dL — ABNORMAL HIGH (ref 65–99)
POTASSIUM: 3.5 mmol/L (ref 3.5–5.1)
Sodium: 138 mmol/L (ref 135–145)
Total Bilirubin: 0.6 mg/dL (ref 0.3–1.2)
Total Protein: 7.3 g/dL (ref 6.5–8.1)

## 2014-10-08 LAB — CBC WITH DIFFERENTIAL/PLATELET
BASOS ABS: 0 10*3/uL (ref 0.0–0.1)
Basophils Relative: 0 % (ref 0–1)
EOS PCT: 1 % (ref 0–5)
Eosinophils Absolute: 0.1 10*3/uL (ref 0.0–0.7)
HCT: 43.9 % (ref 36.0–46.0)
Hemoglobin: 14.9 g/dL (ref 12.0–15.0)
LYMPHS ABS: 0.6 10*3/uL — AB (ref 0.7–4.0)
LYMPHS PCT: 3 % — AB (ref 12–46)
MCH: 30.3 pg (ref 26.0–34.0)
MCHC: 33.9 g/dL (ref 30.0–36.0)
MCV: 89.4 fL (ref 78.0–100.0)
MONO ABS: 1.5 10*3/uL — AB (ref 0.1–1.0)
Monocytes Relative: 8 % (ref 3–12)
Neutro Abs: 16.6 10*3/uL — ABNORMAL HIGH (ref 1.7–7.7)
Neutrophils Relative %: 88 % — ABNORMAL HIGH (ref 43–77)
PLATELETS: 183 10*3/uL (ref 150–400)
RBC: 4.91 MIL/uL (ref 3.87–5.11)
RDW: 13.4 % (ref 11.5–15.5)
WBC: 18.7 10*3/uL — ABNORMAL HIGH (ref 4.0–10.5)

## 2014-10-08 LAB — TROPONIN I

## 2014-10-08 MED ORDER — ONDANSETRON HCL 4 MG/2ML IJ SOLN
4.0000 mg | Freq: Once | INTRAMUSCULAR | Status: AC
  Administered 2014-10-08: 4 mg via INTRAVENOUS
  Filled 2014-10-08: qty 2

## 2014-10-08 MED ORDER — IOHEXOL 300 MG/ML  SOLN
50.0000 mL | Freq: Once | INTRAMUSCULAR | Status: AC | PRN
  Administered 2014-10-08: 50 mL via INTRAVENOUS

## 2014-10-08 MED ORDER — MORPHINE SULFATE (PF) 4 MG/ML IV SOLN
6.0000 mg | Freq: Once | INTRAVENOUS | Status: DC
  Filled 2014-10-08: qty 2

## 2014-10-08 MED ORDER — ONDANSETRON 8 MG PO TBDP: 8.0000 mg | ORAL_TABLET | Freq: Three times a day (TID) | ORAL | Status: DC | PRN

## 2014-10-08 MED ORDER — IOHEXOL 300 MG/ML  SOLN
100.0000 mL | Freq: Once | INTRAMUSCULAR | Status: AC | PRN
  Administered 2014-10-08: 100 mL via INTRAVENOUS

## 2014-10-08 NOTE — ED Notes (Signed)
Pt admits she has chronic abd pain and other pain issues, states nothing she takes for pain helps, has been told in the past that she needed a chronic pain management doctor.  Pt states the pain and diarrhea she is having tonight has been going on for a year.

## 2014-10-08 NOTE — Discharge Instructions (Signed)

## 2014-10-08 NOTE — ED Provider Notes (Signed)
CSN: [id number]     Arrival date & time 10/08/14  0246 History   First MD Initiated Contact with Patient 10/08/14 0309     Chief Complaint  Patient presents with  . Abdominal Pain      HPI Patient has a long-standing history of recurrent abdominal pain.  She reports she has abdominal pain most days but today her pain worsened.  Her pain is located in her upper abdomen.  She has no radiation up into her chest.  She denies shortness of breath, cough, congestion.  No recent fevers or chills.  She does report some occasional diarrhea but nothing worse over the past 24-48 hours.  She reports nausea tonight without vomiting.  She denies decreased oral intake.  No back pain or flank pain.  She reports her pain is worse with palpation and movement and worse in her upper abdomen.  Denies urinary symptoms.  No melena or hematochezia   Past Medical History  Diagnosis Date  . Type 2 diabetes mellitus   . Dysphagia, pharyngoesophageal phase   . Esophageal reflux   . Constipation   . Benign neoplasm of colon   . Internal hemorrhoids without mention of complication   . Diaphragmatic hernia without mention of obstruction or gangrene   . Stricture and stenosis of esophagus   . Unspecified gastritis and gastroduodenitis without mention of hemorrhage   . Diverticulosis of colon (without mention of hemorrhage)   . Essential hypertension   . Arthritis   . Heart murmur   . Hyperlipidemia    Past Surgical History  Procedure Laterality Date  . Dilation and curettage of uterus    . Cholecystectomy    . Laparoscopic vaginal hysterectomy    . Rectocele repair    . Breast lumpectomy Right   . Total knee arthroplasty Right   . Foot surgery Left   . Abdominal hysterectomy    . Cataract extraction Bilateral 2006    Implants in both, surgeries done 6 weeks apart   Family History  Problem Relation Age of Onset  . Ovarian cancer Mother   . Breast cancer Sister   . Liver cancer Sister   . Colon cancer  Cousin     Paternal side  . Diabetes Maternal Aunt   . Diabetes Father   . Heart disease Father   . Liver disease Cousin     Maternal side, never drank   Social History  Substance Use Topics  . Smoking status: Never Smoker   . Smokeless tobacco: Never Used  . Alcohol Use: No   OB History    No data available     Review of Systems  All other systems reviewed and are negative.     Allergies  Estrogens; Other; and Sulfonamide derivatives  Home Medications   Prior to Admission medications   Medication Sig Start Date End Date Taking? Authorizing Provider  aspirin 81 MG tablet Take 81 mg by mouth at bedtime.      Historical Provider, MD  cephALEXin (KEFLEX) 500 MG capsule Take 500 mg by mouth daily.    Historical Provider, MD  clidinium-chlordiazePOXIDE (LIBRAX) 5-2.5 MG per capsule Take 1 capsule by mouth 3 (three) times daily as needed. 07/15/13   Amy S Esterwood, PA-C  ezetimibe (ZETIA) 10 MG tablet TAKE 1 TABLET (10 MG TOTAL) BY MOUTH DAILY. 06/08/14   [name], MD  fluticasone (FLONASE) 50 MCG/ACT nasal spray Place 2 sprays into both nostrils daily. 06/08/14   [name], MD  isometheptene-acetaminophen-dichloralphenazone (MIDRIN) 65-100-325 MG capsule One po QID prn headaches 06/08/14   [name], MD  Ketotifen Fumarate (THERA TEARS ALLERGY OP) Apply to eye.    Historical Provider, MD  levocetirizine (XYZAL) 5 MG tablet Take 5 mg by mouth every evening.    Historical Provider, MD  metFORMIN (GLUCOPHAGE) 500 MG tablet Take 1 tablet (500 mg total) by mouth 2 (two) times daily. 06/08/14   [name], MD  mirabegron ER (MYRBETRIQ) 25 MG TB24 tablet Take 25 mg by mouth daily.    Historical Provider, MD  nateglinide (STARLIX) 120 MG tablet TAKE 1 TABLET BY MOUTH 3 TIMES A DAY BEFORE MEALS 06/14/14   [name], MD  Omega-3 Fatty Acids (FISH OIL) 1200 MG CAPS Take 1 capsule by mouth. Take 4 capsules daily.     Historical Provider, MD  ondansetron (ZOFRAN  ODT) 8 MG disintegrating tablet Take 1 tablet (8 mg total) by mouth every 8 (eight) hours as needed for nausea or vomiting. 10/08/14   Jola Schmidt, MD  Pancrelipase, Lip-Prot-Amyl, (CREON) 24000 UNITS CPEP Take one capsule 3 times a day with meals 06/08/14   [name], MD  Probiotic Product (ALIGN) 4 MG CAPS Take 1 capsule by mouth every other day. 01/18/11   Sable Feil, MD  URELLE (URELLE/URISED) 81 MG TABS tablet TAKE 1 TABLET BY MOUTH 4 TIMES A DAY AS NEEDED 09/27/14   Kathyrn Drown, MD  valsartan (DIOVAN) 80 MG tablet TAKE 1 TABLET (80 MG TOTAL) BY MOUTH DAILY. 06/08/14   [name], MD  VITAMIN E PO Take 1 capsule by mouth. Four times per week    Historical Provider, MD  Vitamin Mixture (ESTER-C) 500-60 MG TABS Take by mouth. 1 tab three times weekly      Historical Provider, MD   BP 110/51 mmHg  Pulse 86  Temp(Src) 98 F (36.7 C) (Oral)  Resp 17  Ht 5\' 5"  (1.651 m)  Wt 172 lb (78.019 kg)  BMI 28.62 kg/m2  SpO2 94% Physical Exam  Constitutional: She is oriented to person, place, and time. She appears well-developed and well-nourished. No distress.  HENT:  Head: Normocephalic and atraumatic.  Eyes: EOM are normal.  Neck: Normal range of motion.  Cardiovascular: Normal rate, regular rhythm and normal heart sounds.   Pulmonary/Chest: Effort normal and breath sounds normal.  Abdominal: Soft. She exhibits no distension.  Mild upper abdominal tenderness without guarding or rebound.  Musculoskeletal: Normal range of motion.  Neurological: She is alert and oriented to person, place, and time.  Skin: Skin is warm and dry.  Psychiatric: She has a normal mood and affect. Judgment normal.  Nursing note and vitals reviewed.   ED Course  Procedures (including critical care time) Labs Review Labs Reviewed  CBC WITH DIFFERENTIAL/PLATELET - Abnormal; Notable for the following:    WBC 18.7 (*)    Neutrophils Relative % 88 (*)    Neutro Abs 16.6 (*)    Lymphocytes  Relative 3 (*)    Lymphs Abs 0.6 (*)    Monocytes Absolute 1.5 (*)    All other components within normal limits  COMPREHENSIVE METABOLIC PANEL - Abnormal; Notable for the following:    Glucose, Bld 186 (*)    BUN 30 (*)    All other components within normal limits  TROPONIN I    Imaging Review Ct Abdomen Pelvis W Contrast  10/08/2014   CLINICAL DATA:  Epigastric abdominal pain.  History of pancreatitis.  EXAM: CT ABDOMEN AND PELVIS WITH CONTRAST  TECHNIQUE: Multidetector CT imaging of the abdomen and pelvis was performed using the standard protocol following bolus administration of intravenous contrast.  CONTRAST:  70mL OMNIPAQUE IOHEXOL 300 MG/ML SOLN, 124mL OMNIPAQUE IOHEXOL 300 MG/ML SOLN  COMPARISON:  12/03/2010  FINDINGS: BODY WALL: No contributory findings.  LOWER CHEST: Subtle solid and ground-glass nodularity in the right middle lobe which is new from previous.  ABDOMEN/PELVIS:  Liver: No focal abnormality.  Biliary: Cholecystectomy with progressive common bile duct enlargement which is likely reservoir effect. Noted negative hepatic labs.  Pancreas: Unremarkable.  Spleen: Unremarkable.  Adrenals: Unremarkable.  Kidneys and ureters: No hydronephrosis or stone. Renal sinus and cortical cysts. On coronal imaging, possible 5 mm renal angiomyolipoma in the upper pole right kidney.  Bladder: Unremarkable.  Reproductive: Hysterectomy and normal adnexa.  Pelvic floor laxity.  Bowel: Colonic fluid levels compatible with history of diarrhea. There is no inflammatory bowel wall thickening or obstruction. Colonic diverticulosis, innumerable in the sigmoid colon. No appendicitis.  Retroperitoneum: No mass or adenopathy.  Peritoneum: No ascites or pneumoperitoneum.  Vascular: No acute abnormality.  OSSEOUS: No acute abnormalities.  IMPRESSION: 1. No acute intra-abdominal finding. 2. Mild airspace disease in the right middle lobe, correlate for symptoms of aspiration or developing pneumonia. 3. Chronic  findings are stable from 2012 and noted above.   Electronically Signed   By: Monte Fantasia M.D.   On: 10/08/2014 05:35   I have personally reviewed and evaluated these images and lab results as part of my medical decision-making.   EKG Interpretation   Date/Time:  Saturday October 08 2014 03:18:08 EDT Ventricular Rate:  94 PR Interval:  213 QRS Duration: 95 QT Interval:  363 QTC Calculation: 219 R Axis:   38 Text Interpretation:  Sinus rhythm Borderline prolonged PR interval  Abnormal R-wave progression, early transition No significant change was  found Confirmed by Faigy Stretch  MD, Julia Alkhatib ([id number]) on 10/08/2014 3:22:08 AM      MDM   Final diagnoses:  Abdominal pain, unspecified abdominal location  Nausea    Patient is feeling much better this time.  Note acute pathology.  Questionable right middle lobe developing pneumonia.  Clinically the patient has no cough or congestion or shortness of breath.  Discharge home with primary care follow-up.  Patient understands return to the ER for any new or worsening symptoms  Jola Schmidt, MD 10/08/14 [phone]

## 2014-10-12 ENCOUNTER — Ambulatory Visit (INDEPENDENT_AMBULATORY_CARE_PROVIDER_SITE_OTHER): Payer: Medicare Other | Admitting: Family Medicine

## 2014-10-12 ENCOUNTER — Encounter: Payer: Self-pay | Admitting: Family Medicine

## 2014-10-12 VITALS — BP 122/82 | Ht 64.0 in | Wt 169.2 lb

## 2014-10-12 DIAGNOSIS — E119 Type 2 diabetes mellitus without complications: Secondary | ICD-10-CM | POA: Diagnosis not present

## 2014-10-12 DIAGNOSIS — J189 Pneumonia, unspecified organism: Secondary | ICD-10-CM | POA: Diagnosis not present

## 2014-10-12 DIAGNOSIS — R1013 Epigastric pain: Secondary | ICD-10-CM | POA: Diagnosis not present

## 2014-10-12 DIAGNOSIS — J181 Lobar pneumonia, unspecified organism: Secondary | ICD-10-CM

## 2014-10-12 MED ORDER — AMOXICILLIN 500 MG PO CAPS: 500.0000 mg | ORAL_CAPSULE | Freq: Three times a day (TID) | ORAL | Status: DC

## 2014-10-12 NOTE — Progress Notes (Signed)
   Subjective:    Patient ID: Deborah Gray, female    DOB: Feb 13, 1934, 81 y.o.   MRN: [id number]  HPI Patient arrives for a follow up in the ER for abdominal pain.  Still some abd discomfort overall tho better  Had very sig epigastric pain and also n and v that led her to doing a scan   yest pt saw the doc for banding of the hemmorhoids but instead took a steroid prep for the   Has noted patient seen in emergency room. Had epigastric discomfort. Blood work done there revealed elevated white blood count.  CT scan results are reviewed and presence of patient. No acute intra-abdominal process. There was question of a patch of right middle lobe pneumonia.  After improvement in the ER patient was discharged home with no acute medications.  Patient goes into an extraordinarily lengthy description of all her challenges down through the years from a GI standpoint. This can be found detailed in prior notes.  Patient has history of pancreatic insufficiency.  Patient once again went through a very lengthy description of her frustration with her prior GI doctor and why she has a new one. This will be in September.  Patient states currently feeling some mild epigastric discomfort  Review of Systems No headache no chest pain no radiation to back no change in urinary habits some chronic loose stools and alternating with constipation    Objective:   Physical Exam  Alert no acute distress talkative very nice lady. H&T normal. Lungs clear. Heart rare rhythm. Abdomen mild epigastric tenderness at most no rebound no guarding excellent bowel sounds      Assessment & Plan:  Impression acute abdominal pain etiology unclear. White blood count was substantially elevated. This deserves a reassessment #2 question pneumonia on scan. Discussed. Patient does have slight cough on occasion. #3 high frustration regarding prior GI doctor patient literally talked for 20 minutes on this alone #4 history of  pancreatitis insufficiency I've advised the patient on like for her future GI doctor to prescribe this medication for her i.e. the pancreas plan repeat blood work. Trial of amoxicillin 3 times a day 10 days. Chest scan in several weeks. Press on GI referral warning signs discussed easily 40 minutes spent with patient primarily in discussion WSL

## 2014-10-12 NOTE — Patient Instructions (Signed)
Please follow up with yuour new stomach doctor as scheduled  We will let you know the results of the blood work and the scan also

## 2014-10-13 ENCOUNTER — Other Ambulatory Visit: Payer: Self-pay

## 2014-10-13 ENCOUNTER — Other Ambulatory Visit: Payer: Self-pay | Admitting: Family Medicine

## 2014-10-13 DIAGNOSIS — R350 Frequency of micturition: Secondary | ICD-10-CM

## 2014-10-13 LAB — CBC WITH DIFFERENTIAL/PLATELET
BASOS ABS: 0 10*3/uL (ref 0.0–0.2)
BASOS: 0 %
EOS (ABSOLUTE): 0.2 10*3/uL (ref 0.0–0.4)
Eos: 3 %
Hematocrit: 41.7 % (ref 34.0–46.6)
Hemoglobin: 13.6 g/dL (ref 11.1–15.9)
IMMATURE GRANULOCYTES: 0 %
Immature Grans (Abs): 0 10*3/uL (ref 0.0–0.1)
Lymphocytes Absolute: 2.7 10*3/uL (ref 0.7–3.1)
Lymphs: 32 %
MCH: 28.9 pg (ref 26.6–33.0)
MCHC: 32.6 g/dL (ref 31.5–35.7)
MCV: 89 fL (ref 79–97)
MONOS ABS: 0.8 10*3/uL (ref 0.1–0.9)
Monocytes: 9 %
NEUTROS PCT: 56 %
Neutrophils Absolute: 4.9 10*3/uL (ref 1.4–7.0)
PLATELETS: 246 10*3/uL (ref 150–379)
RBC: 4.7 x10E6/uL (ref 3.77–5.28)
RDW: 14.5 % (ref 12.3–15.4)
WBC: 8.6 10*3/uL (ref 3.4–10.8)

## 2014-10-13 LAB — LIPASE: LIPASE: 20 U/L (ref 0–59)

## 2014-10-13 LAB — AMYLASE: Amylase: 37 U/L (ref 31–124)

## 2014-10-15 LAB — URINE CULTURE: Organism ID, Bacteria: NO GROWTH

## 2014-10-18 ENCOUNTER — Telehealth: Payer: Self-pay | Admitting: Family Medicine

## 2014-10-18 DIAGNOSIS — R109 Unspecified abdominal pain: Secondary | ICD-10-CM

## 2014-10-18 NOTE — Telephone Encounter (Addendum)
Consult with Dr Nicki Reaper- Dr Richardson Landry to address message in am. Patient advised to got to ER if no better or worse. Patient states she is no worse but not better and getting weaker and don't think she can continue like this for another month.

## 2014-10-18 NOTE — Telephone Encounter (Addendum)
Patient seen for epigastric pain on 10/12/2014.  She said she is not doing much better.  She said she is having really bad pains that will die off every now and then.  Today, the pain is under her right rib.  She is really weak.  When she tries to move, there is a pain in the bottom of her stomach.  She sees Dr. Britta Mccreedy on 11/17/2014, but doesn't feel like she can wait until then for relief.  She would like advice on what to do next.

## 2014-10-19 NOTE — Telephone Encounter (Signed)
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

## 2014-10-19 NOTE — Telephone Encounter (Signed)
Discussed with patient. Patient verbalized understanding and stated she will get blood work today and scheduled office visit for in the morning with Dr Richardson Landry. Blood work ordered in [facility].

## 2014-10-20 ENCOUNTER — Ambulatory Visit (INDEPENDENT_AMBULATORY_CARE_PROVIDER_SITE_OTHER): Payer: Medicare Other | Admitting: Family Medicine

## 2014-10-20 ENCOUNTER — Other Ambulatory Visit: Payer: Self-pay | Admitting: Family Medicine

## 2014-10-20 ENCOUNTER — Encounter: Payer: Self-pay | Admitting: Family Medicine

## 2014-10-20 VITALS — BP 138/70 | Temp 98.2°F | Ht 64.0 in | Wt 166.0 lb

## 2014-10-20 DIAGNOSIS — R109 Unspecified abdominal pain: Secondary | ICD-10-CM | POA: Diagnosis not present

## 2014-10-20 DIAGNOSIS — Z1231 Encounter for screening mammogram for malignant neoplasm of breast: Secondary | ICD-10-CM

## 2014-10-20 LAB — AMYLASE: Amylase: 28 U/L — ABNORMAL LOW (ref 31–124)

## 2014-10-20 LAB — HEPATIC FUNCTION PANEL
ALT: 14 IU/L (ref 0–32)
AST: 9 IU/L (ref 0–40)
Albumin: 4.1 g/dL (ref 3.5–4.8)
Alkaline Phosphatase: 58 IU/L (ref 39–117)
BILIRUBIN, DIRECT: 0.09 mg/dL (ref 0.00–0.40)
Bilirubin Total: 0.3 mg/dL (ref 0.0–1.2)
Total Protein: 6.6 g/dL (ref 6.0–8.5)

## 2014-10-20 LAB — CBC WITH DIFFERENTIAL/PLATELET
BASOS: 1 %
Basophils Absolute: 0 10*3/uL (ref 0.0–0.2)
EOS (ABSOLUTE): 0.1 10*3/uL (ref 0.0–0.4)
Eos: 2 %
Hematocrit: 39 % (ref 34.0–46.6)
Hemoglobin: 13 g/dL (ref 11.1–15.9)
Immature Grans (Abs): 0 10*3/uL (ref 0.0–0.1)
Immature Granulocytes: 0 %
Lymphocytes Absolute: 1.9 10*3/uL (ref 0.7–3.1)
Lymphs: 29 %
MCH: 29.3 pg (ref 26.6–33.0)
MCHC: 33.3 g/dL (ref 31.5–35.7)
MCV: 88 fL (ref 79–97)
MONOS ABS: 0.5 10*3/uL (ref 0.1–0.9)
Monocytes: 8 %
NEUTROS ABS: 4 10*3/uL (ref 1.4–7.0)
Neutrophils: 60 %
PLATELETS: 276 10*3/uL (ref 150–379)
RBC: 4.44 x10E6/uL (ref 3.77–5.28)
RDW: 14.5 % (ref 12.3–15.4)
WBC: 6.6 10*3/uL (ref 3.4–10.8)

## 2014-10-20 LAB — BASIC METABOLIC PANEL
BUN / CREAT RATIO: 32 — AB (ref 11–26)
BUN: 24 mg/dL (ref 8–27)
CHLORIDE: 102 mmol/L (ref 97–108)
CO2: 23 mmol/L (ref 18–29)
Calcium: 9.9 mg/dL (ref 8.7–10.3)
Creatinine, Ser: 0.75 mg/dL (ref 0.57–1.00)
GFR calc non Af Amer: 76 mL/min/{1.73_m2} (ref >59)
GFR, EST AFRICAN AMERICAN: 88 mL/min/{1.73_m2} (ref >59)
Glucose: 91 mg/dL (ref 65–99)
POTASSIUM: 5 mmol/L (ref 3.5–5.2)
Sodium: 142 mmol/L (ref 134–144)

## 2014-10-20 LAB — LIPASE: LIPASE: 13 U/L (ref 0–59)

## 2014-10-20 MED ORDER — HYOSCYAMINE SULFATE 0.125 MG SL SUBL: 0.1250 mg | SUBLINGUAL_TABLET | SUBLINGUAL | Status: DC | PRN

## 2014-10-20 NOTE — Progress Notes (Signed)
   Subjective:    Patient ID: Deborah Gray, female    DOB: January 20, 1934, 81 y.o.   MRN: [id number]  HPI  Pain has been off and on and severe at times  Bowel movements fairly regular and normal for the pt,  One was rather heavy Bowel movements can be narrow at time and large at times   librax took initially for spasms of the esophagus, does not [place]  Pyridium variation the patient took for urination. Overall urination doing fine now. Culture came back negative.  The last 5 days pain is been off-and-on. At times fairly severe.  Due to see gastroenterologist next month.  No fever no chills.      Review of Systems No headache no chest pain no back pain no change in bowel habits    Objective:   Physical Exam Alert vitals stable lungs clear heart rhythm no CA tenderness abdomen diffuse mild tenderness low abdomen and left upper quadrant no rebound no guarding great bowel sounds       Assessment & Plan:  Impression 1 chronic abdominal pain likely and irritable bowel syndrome variant discussed #2 chronic pancreatic insufficiency plan trial Levsin SL. Antinausea medicines discussed. Follow-up with specialist. Many questions answered easily 30 minutes spent most in discussion WSL

## 2014-10-21 ENCOUNTER — Ambulatory Visit (INDEPENDENT_AMBULATORY_CARE_PROVIDER_SITE_OTHER): Payer: Medicare Other | Admitting: Urology

## 2014-10-21 DIAGNOSIS — N3010 Interstitial cystitis (chronic) without hematuria: Secondary | ICD-10-CM | POA: Diagnosis not present

## 2014-10-21 DIAGNOSIS — N3020 Other chronic cystitis without hematuria: Secondary | ICD-10-CM | POA: Diagnosis not present

## 2014-10-25 ENCOUNTER — Telehealth: Payer: Self-pay | Admitting: Family Medicine

## 2014-10-25 DIAGNOSIS — Z8701 Personal history of pneumonia (recurrent): Secondary | ICD-10-CM

## 2014-10-25 NOTE — Telephone Encounter (Signed)
Patient's insurance (both primary & secondary coverage) DENIED request for Chest CT for pneumonia, scan is scheduled for 11/02/14, please advise

## 2014-10-25 NOTE — Telephone Encounter (Signed)
Tell pt her insur is denying scan, let's do follow up chestxray at that time if any abnormality wil need to do chest scan

## 2014-10-25 NOTE — Telephone Encounter (Signed)
[facility]

## 2014-10-26 NOTE — Telephone Encounter (Signed)
Notified patient her insurance is denying scan, let's do follow up chest xray at that time if any abnormality will need to do chest scan. Patient verbalized understanding. Chest xray order put in the system.

## 2014-10-27 ENCOUNTER — Ambulatory Visit (HOSPITAL_COMMUNITY)
Admission: RE | Admit: 2014-10-27 | Discharge: 2014-10-27 | Disposition: A | Discharge status: Home / Self Care | Payer: Medicare Other | Source: Ambulatory Visit | Attending: Family Medicine | Admitting: Family Medicine

## 2014-10-27 ENCOUNTER — Ambulatory Visit (HOSPITAL_COMMUNITY): Payer: BC Managed Care – PPO

## 2014-10-27 DIAGNOSIS — Z1231 Encounter for screening mammogram for malignant neoplasm of breast: Secondary | ICD-10-CM | POA: Diagnosis present

## 2014-11-02 ENCOUNTER — Ambulatory Visit (HOSPITAL_COMMUNITY)
Admission: RE | Admit: 2014-11-02 | Discharge: 2014-11-02 | Disposition: A | Discharge status: Home / Self Care | Payer: Medicare Other | Source: Ambulatory Visit | Attending: Family Medicine | Admitting: Family Medicine

## 2014-11-02 ENCOUNTER — Other Ambulatory Visit: Payer: Self-pay | Admitting: Family Medicine

## 2014-11-02 ENCOUNTER — Other Ambulatory Visit (HOSPITAL_COMMUNITY): Payer: BC Managed Care – PPO

## 2014-11-02 DIAGNOSIS — J189 Pneumonia, unspecified organism: Secondary | ICD-10-CM | POA: Insufficient documentation

## 2014-11-22 ENCOUNTER — Ambulatory Visit (INDEPENDENT_AMBULATORY_CARE_PROVIDER_SITE_OTHER): Payer: Medicare Other | Admitting: Urology

## 2014-11-22 DIAGNOSIS — N3020 Other chronic cystitis without hematuria: Secondary | ICD-10-CM | POA: Diagnosis not present

## 2014-11-22 DIAGNOSIS — N3010 Interstitial cystitis (chronic) without hematuria: Secondary | ICD-10-CM | POA: Diagnosis not present

## 2014-11-29 ENCOUNTER — Encounter: Payer: Medicare Other | Admitting: Nurse Practitioner

## 2014-11-30 ENCOUNTER — Ambulatory Visit (INDEPENDENT_AMBULATORY_CARE_PROVIDER_SITE_OTHER): Payer: Medicare Other | Admitting: Family Medicine

## 2014-11-30 ENCOUNTER — Encounter: Payer: Self-pay | Admitting: Family Medicine

## 2014-11-30 ENCOUNTER — Ambulatory Visit (HOSPITAL_COMMUNITY)
Admission: RE | Admit: 2014-11-30 | Discharge: 2014-11-30 | Disposition: A | Discharge status: Home / Self Care | Payer: Medicare Other | Source: Ambulatory Visit | Attending: Family Medicine | Admitting: Family Medicine

## 2014-11-30 VITALS — BP 128/72 | Temp 98.5°F | Ht 64.0 in | Wt 164.5 lb

## 2014-11-30 DIAGNOSIS — R109 Unspecified abdominal pain: Secondary | ICD-10-CM

## 2014-11-30 DIAGNOSIS — K5792 Diverticulitis of intestine, part unspecified, without perforation or abscess without bleeding: Secondary | ICD-10-CM | POA: Insufficient documentation

## 2014-11-30 DIAGNOSIS — M5136 Other intervertebral disc degeneration, lumbar region: Secondary | ICD-10-CM | POA: Insufficient documentation

## 2014-11-30 DIAGNOSIS — Z23 Encounter for immunization: Secondary | ICD-10-CM | POA: Diagnosis not present

## 2014-11-30 DIAGNOSIS — I700 Atherosclerosis of aorta: Secondary | ICD-10-CM | POA: Insufficient documentation

## 2014-11-30 DIAGNOSIS — Z9049 Acquired absence of other specified parts of digestive tract: Secondary | ICD-10-CM | POA: Diagnosis not present

## 2014-11-30 DIAGNOSIS — N8110 Cystocele, unspecified: Secondary | ICD-10-CM | POA: Insufficient documentation

## 2014-11-30 MED ORDER — HYDROCODONE-ACETAMINOPHEN 5-325 MG PO TABS: ORAL_TABLET | ORAL | Status: DC

## 2014-11-30 MED ORDER — METRONIDAZOLE 500 MG PO TABS: 500.0000 mg | ORAL_TABLET | Freq: Three times a day (TID) | ORAL | Status: DC

## 2014-11-30 MED ORDER — IOHEXOL 300 MG/ML  SOLN
100.0000 mL | Freq: Once | INTRAMUSCULAR | Status: AC | PRN
  Administered 2014-11-30: 100 mL via INTRAVENOUS

## 2014-11-30 MED ORDER — CIPROFLOXACIN HCL 500 MG PO TABS: 500.0000 mg | ORAL_TABLET | Freq: Two times a day (BID) | ORAL | Status: DC

## 2014-11-30 NOTE — Progress Notes (Signed)
   Subjective:    Patient ID: Deborah Gray, female    DOB: 11-24-1933, 81 y.o.   MRN: [id number]  Abdominal Pain This is a new problem. The current episode started more than 1 month ago. The abdominal pain radiates to the pelvis, LLQ and RLQ. Treatments tried: Amoxicillin.    patient's pain is worse than left lower quadrant. Unfortunately she took some of her husband's antibiotic. Possible low-grade fever. Pain day and night. Seems to be worse at nighttime. Sharp left lower quadrant. Some radiation to posterior back. Next  Also notes change in stool somewhat loose greener color.  Diminished energy somewhat achy.  Next  History in 6 significant for known diverticulosis   recent urinary symptoms Patient would like flu shot.  Review of Systems  Gastrointestinal: Positive for abdominal pain.    no nausea no chest pain no dysuria    Objective:   Physical Exam   alert vital stable HET normal lungs clear heart rare rhythm abdomen left for quadrant tenderness distinct past sounds present no rebound slight guarding rectal exam normal      Assessment & Plan:   impression abdominal pain tenderness guarding progressive worse last few days with concerning history sent for urgent CT scan blood work. White blood count slightly elevated CT scan diverticulitis discussed with patient and have asked initiated recheck in one week. We will need to work on yet another gastroenterologist for this patient who is definite chronic abdominal pain patient in addition to her acute flare at this time [facility]

## 2014-12-01 ENCOUNTER — Ambulatory Visit: Payer: Medicare Other | Admitting: Family Medicine

## 2014-12-01 LAB — HEPATIC FUNCTION PANEL
ALK PHOS: 64 IU/L (ref 39–117)
ALT: 11 IU/L (ref 0–32)
AST: 11 IU/L (ref 0–40)
Albumin: 4.4 g/dL (ref 3.5–4.8)
Bilirubin Total: 0.4 mg/dL (ref 0.0–1.2)
TOTAL PROTEIN: 7 g/dL (ref 6.0–8.5)

## 2014-12-01 LAB — BASIC METABOLIC PANEL
BUN / CREAT RATIO: 21 (ref 11–26)
BUN: 16 mg/dL (ref 8–27)
CHLORIDE: 101 mmol/L (ref 97–108)
CO2: 27 mmol/L (ref 18–29)
Calcium: 10 mg/dL (ref 8.7–10.3)
Creatinine, Ser: 0.78 mg/dL (ref 0.57–1.00)
GFR calc Af Amer: 84 mL/min/{1.73_m2} (ref >59)
GFR calc non Af Amer: 73 mL/min/{1.73_m2} (ref >59)
GLUCOSE: 106 mg/dL — AB (ref 65–99)
Potassium: 5 mmol/L (ref 3.5–5.2)
Sodium: 139 mmol/L (ref 134–144)

## 2014-12-01 LAB — CBC WITH DIFFERENTIAL/PLATELET
BASOS ABS: 0 10*3/uL (ref 0.0–0.2)
Basos: 0 %
EOS (ABSOLUTE): 0.1 10*3/uL (ref 0.0–0.4)
EOS: 1 %
Hematocrit: 38.9 % (ref 34.0–46.6)
Hemoglobin: 13.4 g/dL (ref 11.1–15.9)
LYMPHS ABS: 2.2 10*3/uL (ref 0.7–3.1)
Lymphs: 17 %
MCH: 29.6 pg (ref 26.6–33.0)
MCHC: 34.4 g/dL (ref 31.5–35.7)
MCV: 86 fL (ref 79–97)
MONOS ABS: 1.1 10*3/uL — AB (ref 0.1–0.9)
Monocytes: 9 %
Neutrophils Absolute: 9.6 10*3/uL — ABNORMAL HIGH (ref 1.4–7.0)
Neutrophils: 73 %
PLATELETS: 288 10*3/uL (ref 150–379)
RBC: 4.52 x10E6/uL (ref 3.77–5.28)
RDW: 14.6 % (ref 12.3–15.4)
WBC: 13 10*3/uL — AB (ref 3.4–10.8)

## 2014-12-01 LAB — AMYLASE: AMYLASE: 24 U/L — AB (ref 31–124)

## 2014-12-01 LAB — LIPASE: Lipase: 10 U/L (ref 0–59)

## 2014-12-05 ENCOUNTER — Ambulatory Visit: Payer: Medicare Other | Admitting: Family Medicine

## 2014-12-08 ENCOUNTER — Encounter: Payer: Medicare Other | Admitting: Nurse Practitioner

## 2014-12-22 ENCOUNTER — Ambulatory Visit (INDEPENDENT_AMBULATORY_CARE_PROVIDER_SITE_OTHER): Payer: Medicare Other | Admitting: Nurse Practitioner

## 2014-12-22 ENCOUNTER — Encounter: Payer: Self-pay | Admitting: Nurse Practitioner

## 2014-12-22 VITALS — BP 134/80 | Ht 63.0 in | Wt 166.0 lb

## 2014-12-22 DIAGNOSIS — Z0000 Encounter for general adult medical examination without abnormal findings: Secondary | ICD-10-CM

## 2014-12-22 DIAGNOSIS — E119 Type 2 diabetes mellitus without complications: Secondary | ICD-10-CM | POA: Diagnosis not present

## 2014-12-22 DIAGNOSIS — E785 Hyperlipidemia, unspecified: Secondary | ICD-10-CM | POA: Insufficient documentation

## 2014-12-22 DIAGNOSIS — I10 Essential (primary) hypertension: Secondary | ICD-10-CM | POA: Diagnosis not present

## 2014-12-22 DIAGNOSIS — K5792 Diverticulitis of intestine, part unspecified, without perforation or abscess without bleeding: Secondary | ICD-10-CM

## 2014-12-22 MED ORDER — CIPROFLOXACIN HCL 500 MG PO TABS: 500.0000 mg | ORAL_TABLET | Freq: Two times a day (BID) | ORAL | Status: DC

## 2014-12-22 MED ORDER — METRONIDAZOLE 500 MG PO TABS: 500.0000 mg | ORAL_TABLET | Freq: Three times a day (TID) | ORAL | Status: DC

## 2014-12-23 ENCOUNTER — Encounter: Payer: Self-pay | Admitting: Nurse Practitioner

## 2014-12-23 ENCOUNTER — Ambulatory Visit (INDEPENDENT_AMBULATORY_CARE_PROVIDER_SITE_OTHER): Payer: Medicare Other | Admitting: Urology

## 2014-12-23 DIAGNOSIS — N3020 Other chronic cystitis without hematuria: Secondary | ICD-10-CM

## 2014-12-23 DIAGNOSIS — N3010 Interstitial cystitis (chronic) without hematuria: Secondary | ICD-10-CM

## 2014-12-23 NOTE — Progress Notes (Signed)
Subjective:    Patient ID: Deborah Gray, female    DOB: [birth year], 81 y.o.   MRN: [id number]  HPI presents for her wellness exam. Married, same sexual partner. Regular vision and dental exams. Overall healthy diet. Regular exercise, rides her stationary bike 20 minutes 5 days week. Recently treated for diverticulitis and left lower quadrant abdominal pain. Completed antibiotics. Symptoms were much improved last week, again having symptoms again this week. Plans yearly eye exam in December.    Review of Systems  Constitutional: Negative for activity change, appetite change and fatigue.  HENT: Negative for dental problem, ear pain, sinus pressure and sore throat.   Respiratory: Negative for cough, chest tightness, shortness of breath and wheezing.   Cardiovascular: Negative for chest pain.  Gastrointestinal: Positive for abdominal pain and abdominal distention. Negative for nausea, vomiting, diarrhea, constipation and blood in stool.  Genitourinary: Negative for dysuria, urgency, frequency, vaginal discharge, enuresis, difficulty urinating, genital sores and pelvic pain.       Objective:   Physical Exam  Constitutional: She is oriented to person, place, and time. She appears well-developed. No distress.  HENT:  Right Ear: External ear normal.  Left Ear: External ear normal.  Mouth/Throat: Oropharynx is clear and moist.  Neck: Normal range of motion. Neck supple. No tracheal deviation present. No thyromegaly present.  Cardiovascular: Normal rate, regular rhythm and normal heart sounds.  Exam reveals no gallop.   No murmur heard. Pulmonary/Chest: Effort normal and breath sounds normal.  Abdominal: Soft. She exhibits no distension and no mass. There is tenderness. There is no rebound and no guarding.  Mild left lower quadrant tenderness to palpation.  Genitourinary:  Bimanual exam: No tenderness or obvious masses. Rectal exam no masses noted, no stool for Hemoccult.    Musculoskeletal: She exhibits no edema.  Lymphadenopathy:    She has no cervical adenopathy.  Neurological: She is alert and oriented to person, place, and time.  Skin: Skin is warm and dry. No rash noted.  Psychiatric: She has a normal mood and affect. Her behavior is normal.  Vitals reviewed.  Breast exam: Areas of dense tissue, no masses noted. Axilla no adenopathy.        Assessment & Plan:   Problem List Items Addressed This Visit      Cardiovascular and Mediastinum   Essential hypertension     Endocrine   Type 2 diabetes mellitus ([place])   Relevant Orders   Microalbumin / creatinine urine ratio   Hemoglobin A1C     Other   Hyperlipidemia   Relevant Orders   Lipid panel    Other Visit Diagnoses    Routine general medical examination at a health care facility    -  Primary    Relevant Orders    POC Hemoccult Bld/Stl (3-Cd Home Screen)    Acute diverticulitis          Consulted with Dr. Richardson Landry regarding recent diverticulitis. Restart antibiotics as directed. Call back next week if no improvement, sooner if worse. Patient takes daily multivitamin with vitamin D and calcium. Meds ordered this encounter  Medications  . ciprofloxacin (CIPRO) 500 MG tablet    Sig: Take 1 tablet (500 mg total) by mouth 2 (two) times daily. For 10 days    Dispense:  20 tablet    Refill:  0    Order Specific Question:  Supervising Provider    Answer:  [name] [[id number]]  . metroNIDAZOLE (FLAGYL) 500 MG tablet  Sig: Take 1 tablet (500 mg total) by mouth 3 (three) times daily. For 10 days    Dispense:  30 tablet    Refill:  0    Order Specific Question:  Supervising Provider    Answer:  [name] [[id number]]    Return in about 6 months (around 06/21/2015) for recheck.

## 2014-12-24 LAB — HEMOGLOBIN A1C
ESTIMATED AVERAGE GLUCOSE: 128 mg/dL
Hgb A1c MFr Bld: 6.1 % — ABNORMAL HIGH (ref 4.8–5.6)

## 2014-12-24 LAB — LIPID PANEL
CHOLESTEROL TOTAL: 204 mg/dL — AB (ref 100–199)
Chol/HDL Ratio: 3.9 ratio units (ref 0.0–4.4)
HDL: 52 mg/dL (ref >39)
LDL Calculated: 133 mg/dL — ABNORMAL HIGH (ref 0–99)
TRIGLYCERIDES: 96 mg/dL (ref 0–149)
VLDL CHOLESTEROL CAL: 19 mg/dL (ref 5–40)

## 2014-12-24 LAB — MICROALBUMIN / CREATININE URINE RATIO
Creatinine, Urine: 109.7 mg/dL
MICROALB/CREAT RATIO: 19.9 mg/g creat (ref 0.0–30.0)
Microalbumin, Urine: 21.8 ug/mL

## 2014-12-27 ENCOUNTER — Encounter: Payer: Self-pay | Admitting: Family Medicine

## 2014-12-27 ENCOUNTER — Ambulatory Visit (INDEPENDENT_AMBULATORY_CARE_PROVIDER_SITE_OTHER): Payer: Medicare Other | Admitting: Family Medicine

## 2014-12-27 VITALS — BP 134/80 | Ht 64.0 in | Wt 166.0 lb

## 2014-12-27 DIAGNOSIS — R109 Unspecified abdominal pain: Secondary | ICD-10-CM | POA: Diagnosis not present

## 2014-12-27 DIAGNOSIS — J029 Acute pharyngitis, unspecified: Secondary | ICD-10-CM | POA: Diagnosis not present

## 2014-12-27 LAB — POCT RAPID STREP A (OFFICE): Rapid Strep A Screen: POSITIVE — AB

## 2014-12-27 MED ORDER — AMOXICILLIN 500 MG PO CAPS: 500.0000 mg | ORAL_CAPSULE | Freq: Three times a day (TID) | ORAL | Status: DC

## 2014-12-27 NOTE — Progress Notes (Signed)
   Subjective:    Patient ID: Deborah Gray, female    DOB: May 09, 1933, 81 y.o.   MRN: [id number]  Cough This is a new problem. The current episode started in the past 7 days. Associated symptoms include nasal congestion, a sore throat and wheezing. Treatments tried: mucinex, tylenol.   Little runny nose and cong and drainage ancd then cough  This morn throat was on fire  Felt bad, Pos exp to strep thru church, and  Many exposures   Pt notes abd overall feels a little better,  Pt noted some br blood via hem,    Review of Systems  HENT: Positive for sore throat.   Respiratory: Positive for cough and wheezing.        Objective:   Physical Exam Alert nad, vss, occas bronchial cough during exam, pos nasal disch. Lungs clear heart rrr       Assessment & Plan:  Rhiosinutis /bronchitis. Also diverticulitis clinically improving Pla Abx prescribed, warning signs disc, pt had spoke with caroly re referrral on to dr Sydell Axon, will do. WSL

## 2014-12-27 NOTE — Patient Instructions (Signed)
Hold the cipro, but continue the flagyl until finished  plz take all the amox

## 2014-12-28 LAB — STREP A DNA PROBE: STREP GP A DIRECT, DNA PROBE: NEGATIVE

## 2015-01-06 ENCOUNTER — Other Ambulatory Visit: Payer: Self-pay | Admitting: *Deleted

## 2015-01-06 ENCOUNTER — Telehealth: Payer: Self-pay | Admitting: Family Medicine

## 2015-01-06 MED ORDER — CEPHALEXIN 500 MG PO CAPS: 500.0000 mg | ORAL_CAPSULE | Freq: Three times a day (TID) | ORAL | Status: DC

## 2015-01-06 NOTE — Telephone Encounter (Signed)
Keflex 500 tid ten d 

## 2015-01-06 NOTE — Telephone Encounter (Signed)
Med sent to pharm. Pt notified.  

## 2015-01-06 NOTE — Telephone Encounter (Signed)
Pt called stating that she has finished her antibiotic and that she is still having a lot of mucous coming out and the glands in her throat is still swollen. Please advise.    [facility]

## 2015-01-11 ENCOUNTER — Encounter: Payer: Self-pay | Admitting: Gastroenterology

## 2015-01-24 ENCOUNTER — Ambulatory Visit (INDEPENDENT_AMBULATORY_CARE_PROVIDER_SITE_OTHER): Payer: Medicare Other | Admitting: Urology

## 2015-01-24 ENCOUNTER — Ambulatory Visit: Payer: Medicare Other | Admitting: Family Medicine

## 2015-01-24 DIAGNOSIS — N3010 Interstitial cystitis (chronic) without hematuria: Secondary | ICD-10-CM | POA: Diagnosis not present

## 2015-01-30 ENCOUNTER — Ambulatory Visit (INDEPENDENT_AMBULATORY_CARE_PROVIDER_SITE_OTHER): Payer: Medicare Other | Admitting: Gastroenterology

## 2015-01-30 ENCOUNTER — Encounter: Payer: Self-pay | Admitting: Gastroenterology

## 2015-01-30 VITALS — BP 127/75 | HR 78 | Temp 98.4°F | Ht 63.0 in | Wt 163.4 lb

## 2015-01-30 DIAGNOSIS — K861 Other chronic pancreatitis: Secondary | ICD-10-CM

## 2015-01-30 DIAGNOSIS — K5730 Diverticulosis of large intestine without perforation or abscess without bleeding: Secondary | ICD-10-CM | POA: Diagnosis not present

## 2015-01-30 DIAGNOSIS — K589 Irritable bowel syndrome without diarrhea: Secondary | ICD-10-CM | POA: Insufficient documentation

## 2015-01-30 MED ORDER — HYOSCYAMINE SULFATE 0.125 MG SL SUBL: 0.1250 mg | SUBLINGUAL_TABLET | SUBLINGUAL | Status: DC | PRN

## 2015-01-30 NOTE — Progress Notes (Signed)
Primary Care Physician:  Mickie Hillier, MD  Primary Gastroenterologist:  Garfield Cornea, MD   Chief Complaint  Patient presents with  . Abdominal Pain    HPI:  Deborah Gray is a 81 y.o. female here to establish care. She has history of chronic functional GI symptoms, suspected idiopathic chronic pancreatitis (with mention of pancreas divisum in previous notes), Nutcracker's esophagus, with history of extensive workup at [place] and reportedly at [facility] Dr. Roney Mans at [facility] who felt like her symptoms were related to gastrointestinal visceral hypersensitivity associated with underlying diverticulosis although SUDD and SCAD also a possibility.Patient followed for years by Dr. [name] but when he retired she started to see Dr. Ardis Hughs at the same practice. She saw him once in July 2015 but is not interested in pursuing ongoing care with him. He recommended pain clinic prior to any further visits and she didn't feel like she needed pain management. She also prefers to stay local.   Suspected chronic idiopathic pancreatitis dating back at least to the 90s per medical record. Has been on Creon historically. Trypsinogen level 21.8 (within range for chronic pancreatitis in 2003). Malabsorption workup also negative with normal d-xylose absorption test, negative celiac antibody titers remotely and in 2003, 2011 and multiple negative small bowel biopsies as outlined in [place], negative hydrogen breath test,   Recent workup has included CT abdomen and pelvis with contrast in October 2016. Pancreas reported as normal. Focal diverticulitis of the distal sigmoid colon noted.rthrosclerosis of the abdominal aorta noted without aneurysm.Vaginal cystocele. She had a CT of the abdomen pelvis in August 2016 and again pancreas reported unremarkable. Cholecystectomy with progressive common bile duct enlargement compared to 2012. Subtle solid and ground glass nodularity in the right middle lobe of the lung new from  previous comparison. Gastric emptying study June 2013 reported as normal.  Back in August she developed acute on chronic abdominal pain which required ER visit. Describes pain is diffuse associated with bloating. Felt like she couldn't catch her breath. Associated with vomiting. CT abdomen pelvis in the ER as outlined above. Supportive measures only. No antibiotic therapy at the time. Apparently she was referred to Dr. Britta Mccreedy after this visit and he got as far as obtaining records but never any workup or assessment per patient. Patient developed left lower quadrant pain in September/October. Went back to Dr. Wolfgang Phoenix and at that time additional CT revealed mild diverticulitis. Patient states this is her first episode requiring antibiotics for diverticulitis. She took Cipro and Flagyl for 10 days. She was given a second course of Cipro and Flagyl in November and while on this regimen she developed strep throat and her Cipro was switched to amoxicillin. She required Keflex for ongoing pharyngitis as well.  Weight down 7 pounds since 09/2014.  Creon 24000, one tid (qac). Avoids fats because she will get diarrhea. Last few weeks much better. If needs has a bm, llq but eases off after BM. No melena, brbpr. Hemorrhoids act up intermittently. Uses nupercyntal ointment. Had hemorrhoid banding couple of times by Dr. Lennie Hummer. Recently went back to have banding but new doctor would not do it. Denies heartburn, dysphagia.    Current Outpatient Prescriptions  Medication Sig Dispense Refill  . aspirin 81 MG tablet Take 81 mg by mouth at bedtime.      . fluticasone (FLONASE) 50 MCG/ACT nasal spray Place 2 sprays into both nostrils daily. 16 g 11  . hyoscyamine (LEVSIN/SL) 0.125 MG SL tablet Place 1 tablet (0.125 mg  total) under the tongue every 4 (four) hours as needed. 90 tablet 1  . isometheptene-acetaminophen-dichloralphenazone (MIDRIN) 65-100-325 MG capsule One po QID prn headaches 30 capsule 0  . Ketotifen Fumarate  (THERA TEARS ALLERGY OP) Apply to eye.    . levocetirizine (XYZAL) 5 MG tablet Take 5 mg by mouth every evening. Reported on 02/02/2015    . metFORMIN (GLUCOPHAGE) 500 MG tablet Take 1 tablet (500 mg total) by mouth 2 (two) times daily. 180 tablet 3  . mirabegron ER (MYRBETRIQ) 25 MG TB24 tablet Take 25 mg by mouth daily.    . nateglinide (STARLIX) 120 MG tablet TAKE 1 TABLET BY MOUTH 3 TIMES A DAY BEFORE MEALS 270 tablet 3  . Omega-3 Fatty Acids (FISH OIL) 1200 MG CAPS Take 1 capsule by mouth. Take 4 capsules daily.     . Pancrelipase, Lip-Prot-Amyl, (CREON) 24000 UNITS CPEP Take one capsule 3 times a day with meals 270 capsule 3  . Probiotic Product (ALIGN) 4 MG CAPS Take 1 capsule by mouth every other day.    . valsartan (DIOVAN) 80 MG tablet TAKE 1 TABLET (80 MG TOTAL) BY MOUTH DAILY. 90 tablet 3  . VITAMIN E PO Take 1 capsule by mouth. Four times per week    . Vitamin Mixture (ESTER-C) 500-60 MG TABS Take by mouth. 1 tab three times weekly      . ZETIA 10 MG tablet TAKE 1 TABLET (10 MG TOTAL) BY MOUTH DAILY. 30 tablet 5  . URELLE (URELLE/URISED) 81 MG TABS tablet TAKE 1 TABLET BY MOUTH 4 TIMES A DAY AS NEEDED 360 tablet 2   No current facility-administered medications for this visit.    Allergies as of 01/30/2015 - Review Complete 01/30/2015  Allergen Reaction Noted  . Estrogens  10/15/2012  . Other  09/01/2013  . Sulfonamide derivatives  03/01/2008    Past Medical History  Diagnosis Date  . Type 2 diabetes mellitus ([place])   . Dysphagia, pharyngoesophageal phase   . Esophageal reflux   . Constipation   . Benign neoplasm of colon   . Internal hemorrhoids without mention of complication   . Diaphragmatic hernia without mention of obstruction or gangrene   . Stricture and stenosis of esophagus   . Unspecified gastritis and gastroduodenitis without mention of hemorrhage   . Diverticulosis of colon (without mention of hemorrhage)   . Essential hypertension   . Arthritis   .  Heart murmur   . Hyperlipidemia     Past Surgical History  Procedure Laterality Date  . Dilation and curettage of uterus    . Cholecystectomy    . Laparoscopic vaginal hysterectomy    . Rectocele repair    . Breast lumpectomy Right     benign  . Total knee arthroplasty Right   . Foot surgery Left   . Abdominal hysterectomy    . Cataract extraction Bilateral 2006    Implants in both, surgeries done 6 weeks apart  . Esophagogastroduodenoscopy  03/08/02    [name]: esophageal stricture, chronic gerd, s/p dilation.   . Colonoscopy  03/08/02    [name]: diverticulosis, internal hemorrhoids, adenomatous colon polyp  . Esophagogastroduodenoscopy  01/23/04    [name]: esophageal stricture, gastritis, hiatal hernia  . Colonoscopy  01/23/04    Patterson: diverticulosis, internal hemorrhoids  . Colonoscopy  09/25/05    [name]: diverticulosis  . Esophagogastroduodenoscopy  09/25/05    Patterson: gastritis, benign bx, no H.pylori  . Esophagogastroduodenoscopy  07/05/09    Patterson: gastropathy, benign small bowel  bx and gastric bx  . Colonoscopy  07/05/09    [name]: severe diverticulosis  . Colonoscopy  01/21/11    [name]: severe diverticulosis in sigmoid to desc colon, int hemorrhoids, follow up TCS in 5 years  . Esophageal manometry  03/21/08    Patterson: findings c/w Nutcracker Esophagus    Family History  Problem Relation Age of Onset  . Ovarian cancer Mother   . Breast cancer Sister   . Liver cancer Sister   . Colon cancer Cousin     Paternal side  . Diabetes Maternal Aunt   . Diabetes Father   . Heart disease Father   . Liver disease Cousin     Maternal side, never drank    Social History   Social History  . Marital Status: Married    Spouse Name: N/A  . Number of Children: 3  . Years of Education: N/A   Occupational History  . Retired    Social History Main Topics  . Smoking status: Never Smoker   . Smokeless tobacco: Never Used  . Alcohol Use: No  .  Drug Use: No  . Sexual Activity: Not on file   Other Topics Concern  . Not on file   Social History Narrative      ROS:  General: Negative for anorexia,  fever, chills, fatigue, weakness. See hpi. Eyes: Negative for vision changes.  ENT: Negative for hoarseness, difficulty swallowing , nasal congestion. CV: Negative for chest pain, angina, palpitations, dyspnea on exertion, peripheral edema.  Respiratory: Negative for dyspnea at rest, dyspnea on exertion, cough, sputum, wheezing.  GI: See history of present illness. GU:  Negative for dysuria, hematuria, urinary incontinence,   nocturnal urination. +interstitial cystitis MS: Negative for joint pain, low back pain.  Derm: Negative for rash or itching.  Neuro: Negative for weakness, abnormal sensation, seizure, frequent headaches, memory loss, confusion.  Psych: Negative for anxiety, depression, suicidal ideation, hallucinations.  Endo: Negative for unusual weight change.  Heme: Negative for bruising or bleeding. Allergy: Negative for rash or hives.    Physical Examination:  BP 127/75 mmHg  Pulse 78  Temp(Src) 98.4 F (36.9 C) (Oral)  Ht 5\' 3"  (1.6 m)  Wt 163 lb 6.4 oz (74.118 kg)  BMI 28.95 kg/m2   General: Well-nourished, well-developed in no acute distress.  Head: Normocephalic, atraumatic.   Eyes: Conjunctiva pink, no icterus. Mouth: Oropharyngeal mucosa moist and pink , no lesions erythema or exudate. Neck: Supple without thyromegaly, masses, or lymphadenopathy.  Lungs: Clear to auscultation bilaterally.  Heart: Regular rate and rhythm, no murmurs rubs or gallops.  Abdomen: Bowel sounds are normal, nontender, nondistended, no hepatosplenomegaly or masses, no abdominal bruits or    hernia , no rebound or guarding.   Rectal: not performed Extremities: No lower extremity edema. No clubbing or deformities.  Neuro: Alert and oriented x 4 , grossly normal neurologically.  Skin: Warm and dry, no rash or jaundice.   Psych:  Alert and cooperative, normal mood and affect.  Labs: Lab Results  Component Value Date   WBC 13.0* 11/30/2014   HGB 13.4 11/30/2014   HCT 38.9 11/30/2014   MCV 86 11/30/2014   PLT 288 11/30/2014   Lab Results  Component Value Date   ALT 11 11/30/2014   AST 11 11/30/2014   ALKPHOS 64 11/30/2014   BILITOT 0.4 11/30/2014   Lab Results  Component Value Date   CREATININE 0.78 11/30/2014   BUN 16 11/30/2014   NA 139 11/30/2014  K 5.0 11/30/2014   CL 101 11/30/2014   CO2 27 11/30/2014   Lab Results  Component Value Date   LIPASE 10 11/30/2014     Imaging Studies: No results found.  CLINICAL DATA: Lower abdominal pain.  EXAM: CT ABDOMEN AND PELVIS WITH CONTRAST  TECHNIQUE: Multidetector CT imaging of the abdomen and pelvis was performed using the standard protocol following bolus administration of intravenous contrast.  CONTRAST: 161mL OMNIPAQUE IOHEXOL 300 MG/ML SOLN  COMPARISON: CT scan of October 08, 2014.  FINDINGS: Severe multilevel degenerative disc disease is noted in the lumbar spine. Visualized lung bases are unremarkable.  Status post cholecystectomy. The liver, spleen and pancreas appear normal. Adrenal glands appear normal. Bilateral peripelvic and simple renal cysts are noted. No hydronephrosis or renal obstruction is noted. Atherosclerosis of abdominal aorta is noted without aneurysm formation. No renal or ureteral calculi are noted. The appendix appears normal. Focal diverticulitis of distal sigmoid colon is noted. No definite abscess or fluid collection is noted. Urinary bladder appears normal. Status post hysterectomy. Ovaries are unremarkable. No significant adenopathy is noted. Vaginal cystocele is noted.  IMPRESSION: Mild focal diverticulitis is seen involving the distal sigmoid colon. No definite abscess formation is noted.  Atherosclerosis of abdominal aorta is noted without aneurysm formation.  Vaginal cystocele is  noted.   Electronically Signed  By: Marijo Conception, M.D.  On: 11/30/2014 15:36   Impression/Plan:  81 year old female with extensive GI history previously managed remotely by Dr. Laural Golden, Dr. [name] for nearly 20 years, evaluation by Dr. Roney Mans at [facility] (per Dr. Buel Ream request) who presents to establish care with [place]. Extensive review of previous medical records available, some dating back to the 1990s, pertinent history as outlined above and pertinent information to be scanned into Epic. She carries a diagnosis of chronic pancreatitis, based on information that I have available it appears to be related to remote low trypsinogen level, clinical symptoms Previous imaging that I have available showed normal pancreas. Also with history of functional GI symptoms, nutcracker esophagus which is asymptomatic severe diverticulosis.  Recently CT documented mild diverticulitis, uncomplicated. Multiple rounds of antibiotics but clinically resolved at this point. Clinically she states she is doing recently well this time. She has periods of mild to no abdominal pain. Denies need for chronic pain management. Her last EGD was in 2011. Her next colonoscopy is due in December 2017 for history of adenomatous colon polyps.  She will continue Levsin prn abdominal cramping/pain. She will continue Creon as before. I will discuss case with Dr. Gala Romney. Further recommendations to follow.   45 minutes spent with patient during encounter with greater than 30 minutes spent obtaining history and discussion of plan.

## 2015-01-30 NOTE — Patient Instructions (Signed)
1. I will continue to review your records and scan to your electronic medical record. We'll discuss your case in depth with Dr. Gala Romney. We will touch base with you before the end of the week with any further instructions. Please call sooner if he develops any issues. 2. You may continue Levsin as needed for abdominal pain/cramping. It can cause dry mouth, constipation, so use sparingly. RX sent to [facility].

## 2015-02-02 ENCOUNTER — Encounter: Payer: Self-pay | Admitting: Family Medicine

## 2015-02-02 ENCOUNTER — Ambulatory Visit (INDEPENDENT_AMBULATORY_CARE_PROVIDER_SITE_OTHER): Payer: Medicare Other | Admitting: Family Medicine

## 2015-02-02 VITALS — BP 136/80 | Temp 97.6°F | Ht 64.0 in | Wt 165.5 lb

## 2015-02-02 DIAGNOSIS — J309 Allergic rhinitis, unspecified: Secondary | ICD-10-CM

## 2015-02-02 NOTE — Progress Notes (Signed)
   Subjective:    Patient ID: Deborah Gray, female    DOB: 31-Jan-1934, 81 y.o.   MRN: [id number]  Sinusitis This is a recurrent problem. The current episode started 1 to 4 weeks ago. There has been no fever. Associated symptoms include congestion and sneezing. Treatments tried: Mucinex  The treatment provided mild relief.   Patient in for a recheck of sinusitis. Patient states no other concerns this visit.  Pos nasa l stuffiness  And drainage and conested  Still getting blood stained mucus around the discharge  Nasal passages sore partic around the nostril   Uses flonase yr round, and causing irrit and nasal cong and stuffiness  llergy doc has allergy testing all the time  Takes claritin did not work  Review of Systems  HENT: Positive for congestion and sneezing.    no vomiting no diarrhea     Objective:   Physical Exam Alert vital signs stable no acute distress. Talkative HEENT slight nasal congestion irritation TMs normal frontal neck supple lungs clear. Heart rare rhythm.       Assessment & Plan:  Impression subacute flare of chronic rhinitis, likely allergenic/irritant nature. Doubt acute infection or subacute infection. Patient has had a lot of antibiotics lately plan symptom care only encouraged to try Nasonex. Resume antihistamine saline nasal spray discussed WSL

## 2015-02-07 ENCOUNTER — Encounter: Payer: Self-pay | Admitting: Gastroenterology

## 2015-02-07 NOTE — Progress Notes (Signed)
cc'ed to pcp °

## 2015-02-09 LAB — HM DIABETES EYE EXAM

## 2015-02-14 LAB — HM DIABETES EYE EXAM

## 2015-02-19 HISTORY — PX: HEMORRHOID BANDING: SHX5850

## 2015-02-24 ENCOUNTER — Telehealth: Payer: Self-pay | Admitting: Gastroenterology

## 2015-02-24 ENCOUNTER — Ambulatory Visit (INDEPENDENT_AMBULATORY_CARE_PROVIDER_SITE_OTHER): Payer: Medicare Other | Admitting: Urology

## 2015-02-24 DIAGNOSIS — N3020 Other chronic cystitis without hematuria: Secondary | ICD-10-CM

## 2015-02-24 DIAGNOSIS — N3010 Interstitial cystitis (chronic) without hematuria: Secondary | ICD-10-CM

## 2015-02-24 NOTE — Telephone Encounter (Signed)
Let message for patient to call to follow up on last office visit. I want to talk with her when she calls. Thanks

## 2015-02-28 NOTE — Telephone Encounter (Signed)
Pt return called. She said that LSL can call her when she has time at [phone]

## 2015-02-28 NOTE — Telephone Encounter (Signed)
Noted  

## 2015-02-28 NOTE — Telephone Encounter (Signed)
Was doing well until last night and then had some bloating and heartburn last night. Kept taking the chalky stuff. Yesterday met son for lunch, Short Sugars, one swallow of diet coke (sometimes gets spasms of esophagus), hotdog, french fries. Took Librax. Last few days more esophagus spasms then usual.  Also ate some cheese pizza.      Advised patient to increase creon to 48,000 - 78,000 if having to eat more fatty foods or unsure with eating out.

## 2015-03-01 NOTE — Telephone Encounter (Signed)
Late entry: patient also told me she was very appreciative of the time we have spent with her and listening to her. She is aware of the need for one last colonoscopy later this year for history of adenomatous colon polyps. She will call if she has any further problems.   Recommend OV with RMR only (patient wants to meet him next time) in six months.

## 2015-03-01 NOTE — Telephone Encounter (Signed)
ON RECALL  °

## 2015-03-08 ENCOUNTER — Encounter: Payer: Self-pay | Admitting: Family Medicine

## 2015-03-16 ENCOUNTER — Other Ambulatory Visit: Payer: Self-pay | Admitting: *Deleted

## 2015-03-16 ENCOUNTER — Ambulatory Visit (INDEPENDENT_AMBULATORY_CARE_PROVIDER_SITE_OTHER): Payer: Medicare Other | Admitting: Family Medicine

## 2015-03-16 ENCOUNTER — Encounter: Payer: Self-pay | Admitting: Family Medicine

## 2015-03-16 VITALS — BP 132/84 | Temp 98.0°F | Ht 64.0 in | Wt 166.0 lb

## 2015-03-16 DIAGNOSIS — R350 Frequency of micturition: Secondary | ICD-10-CM

## 2015-03-16 DIAGNOSIS — N390 Urinary tract infection, site not specified: Secondary | ICD-10-CM | POA: Diagnosis not present

## 2015-03-16 DIAGNOSIS — Z0000 Encounter for general adult medical examination without abnormal findings: Secondary | ICD-10-CM

## 2015-03-16 LAB — POC HEMOCCULT BLD/STL (HOME/3-CARD/SCREEN)
FECAL OCCULT BLD: NEGATIVE
FECAL OCCULT BLD: NEGATIVE
FECAL OCCULT BLD: NEGATIVE

## 2015-03-16 MED ORDER — CIPROFLOXACIN HCL 250 MG PO TABS: 250.0000 mg | ORAL_TABLET | Freq: Two times a day (BID) | ORAL | Status: DC

## 2015-03-16 NOTE — Progress Notes (Signed)
   Subjective:    Patient ID: Deborah Gray, female    DOB: Aug 16, 1933, 82 y.o.   MRN: [id number]  Urinary Tract Infection  This is a new problem. The current episode started in the past 7 days. There has been no fever. Associated symptoms include frequency. Pertinent negatives include no vomiting. Associated symptoms comments: Abdominal pain, Painful urination  . Treatments tried: Pyridum, Keflex.      Review of Systems  Constitutional: Negative for activity change, appetite change and fatigue.  HENT: Negative for congestion, ear discharge and rhinorrhea.   Eyes: Negative for discharge.  Respiratory: Negative for cough, chest tightness and wheezing.   Cardiovascular: Negative for chest pain.  Gastrointestinal: Negative for vomiting and abdominal pain.  Genitourinary: Positive for frequency. Negative for difficulty urinating.  Musculoskeletal: Negative for neck pain.  Allergic/Immunologic: Negative for environmental allergies and food allergies.  Neurological: Negative for weakness and headaches.  Psychiatric/Behavioral: Negative for behavioral problems and agitation.  All other systems reviewed and are negative.      Objective:   Physical Exam   alert mild malaise. Vitals stable HEENT normal lungs clear heart regular in rhythm no CVA tenderness   8-10 white blood cells proper field      Assessment & Plan:   impression urinary tract infection plan antibiotics prescribed symptoms care discussed warning signs discussed [facility]

## 2015-03-20 ENCOUNTER — Telehealth: Payer: Self-pay | Admitting: Family Medicine

## 2015-03-20 MED ORDER — PHENAZOPYRIDINE HCL 97.2 MG PO TABS: 97.0000 mg | ORAL_TABLET | Freq: Three times a day (TID) | ORAL | Status: DC | PRN

## 2015-03-20 MED ORDER — NITROFURANTOIN MONOHYD MACRO 100 MG PO CAPS: 100.0000 mg | ORAL_CAPSULE | Freq: Two times a day (BID) | ORAL | Status: DC

## 2015-03-20 NOTE — Telephone Encounter (Signed)
Notified patient that medications were sent to pharmacy.

## 2015-03-20 NOTE — Telephone Encounter (Signed)
Pt called stating that she is not any better and is still having the same UTI symptoms. Pt also stated that the nurse was going to call in peridium plus but the pharmacy has not received it yet. Pt is wanting to know if she needs another antibiotic called in as well due to her not being any better. Please advise.       [facility] [address]

## 2015-03-20 NOTE — Telephone Encounter (Signed)
Patient was seen on 03/16/15 diagnosed with UTI, given Cipro 250 mg 1 BID #14

## 2015-03-20 NOTE — Telephone Encounter (Signed)
i told pt we'd try but when i researched my couple sources sais it had been discontinued--we can try to call in pyridium plus numb 21 one tid prn, If not covered can do pyridium 200mg  21 one tid prn macrobid 100 bid seven d

## 2015-03-24 ENCOUNTER — Ambulatory Visit (INDEPENDENT_AMBULATORY_CARE_PROVIDER_SITE_OTHER): Payer: Medicare Other | Admitting: Urology

## 2015-03-24 DIAGNOSIS — N3000 Acute cystitis without hematuria: Secondary | ICD-10-CM | POA: Diagnosis not present

## 2015-03-24 DIAGNOSIS — N3010 Interstitial cystitis (chronic) without hematuria: Secondary | ICD-10-CM

## 2015-03-28 ENCOUNTER — Ambulatory Visit (INDEPENDENT_AMBULATORY_CARE_PROVIDER_SITE_OTHER): Payer: Medicare Other | Admitting: Urology

## 2015-03-28 DIAGNOSIS — R300 Dysuria: Secondary | ICD-10-CM | POA: Diagnosis not present

## 2015-03-28 DIAGNOSIS — N3000 Acute cystitis without hematuria: Secondary | ICD-10-CM

## 2015-03-28 DIAGNOSIS — N3010 Interstitial cystitis (chronic) without hematuria: Secondary | ICD-10-CM

## 2015-04-06 DIAGNOSIS — E119 Type 2 diabetes mellitus without complications: Secondary | ICD-10-CM | POA: Diagnosis not present

## 2015-04-11 ENCOUNTER — Ambulatory Visit (INDEPENDENT_AMBULATORY_CARE_PROVIDER_SITE_OTHER): Payer: Medicare Other | Admitting: Urology

## 2015-04-11 ENCOUNTER — Telehealth: Payer: Self-pay | Admitting: *Deleted

## 2015-04-11 ENCOUNTER — Other Ambulatory Visit: Payer: Self-pay | Admitting: Physician Assistant

## 2015-04-11 DIAGNOSIS — N3010 Interstitial cystitis (chronic) without hematuria: Secondary | ICD-10-CM

## 2015-04-11 DIAGNOSIS — N3000 Acute cystitis without hematuria: Secondary | ICD-10-CM | POA: Diagnosis not present

## 2015-04-11 DIAGNOSIS — N3020 Other chronic cystitis without hematuria: Secondary | ICD-10-CM | POA: Diagnosis not present

## 2015-04-11 NOTE — Telephone Encounter (Signed)
Per Nicoletta Ba PA, OK to give patient # 90 with 1 refills. [facility], [place]. For further refills the patient will have to call and make appointment to see Dr. Owens Loffler.  Patient saw Nicoletta Ba PA on 07-15-2013 and saw Dr. Ardis Hughs 09-01-2013.

## 2015-04-18 ENCOUNTER — Encounter: Payer: Self-pay | Admitting: Gastroenterology

## 2015-04-18 ENCOUNTER — Ambulatory Visit (INDEPENDENT_AMBULATORY_CARE_PROVIDER_SITE_OTHER): Payer: Medicare Other | Admitting: Gastroenterology

## 2015-04-18 VITALS — BP 122/61 | HR 91 | Temp 97.3°F | Ht 64.0 in | Wt 166.2 lb

## 2015-04-18 DIAGNOSIS — K861 Other chronic pancreatitis: Secondary | ICD-10-CM

## 2015-04-18 DIAGNOSIS — K649 Unspecified hemorrhoids: Secondary | ICD-10-CM

## 2015-04-18 DIAGNOSIS — K224 Dyskinesia of esophagus: Secondary | ICD-10-CM | POA: Diagnosis not present

## 2015-04-18 DIAGNOSIS — K589 Irritable bowel syndrome without diarrhea: Secondary | ICD-10-CM

## 2015-04-18 MED ORDER — IMIPRAMINE HCL 10 MG PO TABS
ORAL_TABLET | ORAL | Status: DC
Start: 2015-04-18 — End: 2015-06-27

## 2015-04-18 MED ORDER — PANCRELIPASE (LIP-PROT-AMYL) 24000-76000 UNITS PO CPEP: ORAL_CAPSULE | ORAL | Status: DC

## 2015-04-18 NOTE — Progress Notes (Signed)
Primary Care Physician: Mickie Hillier, MD  Primary Gastroenterologist:  Garfield Cornea, MD   Chief Complaint  Patient presents with  . Follow-up  . esophageal spasm  . Gastroesophageal Reflux    HPI: Deborah Gray is a 82 y.o. female here for short interval follow-up due to increased issues with esophageal spasms. We first met the patient back in December when she transferred her care to our practice. She has a history of chronic functional GI symptoms, suspected idiopathic chronic pancreatitis, Nutcracker's esophagus, with extensive workup at [facility] and at [facility] as outlined in my 01/30/2015 office note.  Patient reports increased frequency of esophageal spasms. Recently had to request a refill on her Librax, was actually filled bile about our GI surprisingly (given that she transferred her care). She is aware that she can call us in the future for issues or refills. She notes taking Librax does seem to help with the spasms. Continues to have feelings that food is not going down and she is choking when it occurs. Has been waking up in the middle the night with a lot of burning and abdominal bloating. Continues to use levsin as needed for postprandial abdominal spasms loose stools but does not have to use often. She notes that she does not take the Librax and Levsin at the same time. She has taken my advice to increase Creon especially when she's having abdominal pain or eating fatty meals. This seems to have helped. He notes she is running short on her prescription and needs to have it rewritten with new instructions.  She is very concerned about recurrent UTIs. She has had a significant amount of difficulty getting rid of a recent UTI, followed by Dr. Jeffie Pollock and Dr. Wolfgang Phoenix. She has a history of interstitial cystitis as well. She spent the first 20 minutes of the appointment detailing these recent events down to every detail of difficulty talking with the staff over the phone and  making appointments. Initially given cipro BID by PCP. Had to change to [facility] for ongoing symptoms. Recently received call that she needed to extend her Macrobid another 7 days based on culture results. Continues to receive IC bladder instillations. Wonders if her hemorrhoids are contributing to her recurrent UTI because she has difficulty getting herself clean. She has had hemorrhoid banding in the remote past but when she went back to have an additional banding, the new surgeon at the facility refused to do it stating she would have to much pain. She is interested in possible [place] banding with Dr. Gala Romney.       Current Outpatient Prescriptions  Medication Sig Dispense Refill  . aspirin 81 MG tablet Take 81 mg by mouth at bedtime.      . clidinium-chlordiazePOXIDE (LIBRAX) 5-2.5 MG capsule Take 1 tab every 6-8 hours as needed for abdominal spasms. 90 capsule 1  . fluticasone (FLONASE) 50 MCG/ACT nasal spray Place 2 sprays into both nostrils daily. 16 g 11  . hyoscyamine (LEVSIN/SL) 0.125 MG SL tablet Place 1 tablet (0.125 mg total) under the tongue every 4 (four) hours as needed. 90 tablet 1  . isometheptene-acetaminophen-dichloralphenazone (MIDRIN) 65-100-325 MG capsule One po QID prn headaches 30 capsule 0  . Ketotifen Fumarate (THERA TEARS ALLERGY OP) Apply to eye.    . levocetirizine (XYZAL) 5 MG tablet Take 5 mg by mouth every evening. Reported on 03/16/2015    . metFORMIN (GLUCOPHAGE) 500 MG tablet Take 1 tablet (500 mg total) by mouth  2 (two) times daily. 180 tablet 3  . mirabegron ER (MYRBETRIQ) 25 MG TB24 tablet Take 25 mg by mouth daily.    . nateglinide (STARLIX) 120 MG tablet TAKE 1 TABLET BY MOUTH 3 TIMES A DAY BEFORE MEALS 270 tablet 3  . nitrofurantoin, macrocrystal-monohydrate, (MACROBID) 100 MG capsule Take 1 capsule (100 mg total) by mouth 2 (two) times daily. for 7 days 14 capsule 0  . Omega-3 Fatty Acids (FISH OIL) 1200 MG CAPS Take 1 capsule by mouth. Take 4 capsules daily.       . Pancrelipase, Lip-Prot-Amyl, (CREON) 24000 UNITS CPEP Take one capsule 3 times a day with meals 270 capsule 3  . phenazopyridine (PYRIDIUM) 97 MG tablet Take 1 tablet (97 mg total) by mouth 3 (three) times daily as needed for pain. 21 tablet 0  . Probiotic Product (ALIGN) 4 MG CAPS Take 1 capsule by mouth every other day.    . valsartan (DIOVAN) 80 MG tablet TAKE 1 TABLET (80 MG TOTAL) BY MOUTH DAILY. 90 tablet 3  . VITAMIN E PO Take 1 capsule by mouth. Four times per week    . Vitamin Mixture (ESTER-C) 500-60 MG TABS Take by mouth. 1 tab three times weekly      . ZETIA 10 MG tablet TAKE 1 TABLET (10 MG TOTAL) BY MOUTH DAILY. 30 tablet 5   No current facility-administered medications for this visit.    Allergies as of 04/18/2015 - Review Complete 04/18/2015  Allergen Reaction Noted  . Estrogens  10/15/2012  . Other  09/01/2013  . Sulfonamide derivatives  03/01/2008   Past Medical History  Diagnosis Date  . Type 2 diabetes mellitus ([place])   . Dysphagia, pharyngoesophageal phase   . Esophageal reflux   . Constipation   . Benign neoplasm of colon   . Internal hemorrhoids without mention of complication   . Diaphragmatic hernia without mention of obstruction or gangrene   . Stricture and stenosis of esophagus   . Unspecified gastritis and gastroduodenitis without mention of hemorrhage   . Diverticulosis of colon (without mention of hemorrhage)   . Essential hypertension   . Arthritis   . Heart murmur   . Hyperlipidemia    Past Surgical History  Procedure Laterality Date  . Dilation and curettage of uterus    . Cholecystectomy    . Laparoscopic vaginal hysterectomy    . Rectocele repair    . Breast lumpectomy Right     benign  . Total knee arthroplasty Right   . Foot surgery Left   . Abdominal hysterectomy    . Cataract extraction Bilateral 2006    Implants in both, surgeries done 6 weeks apart  . Esophagogastroduodenoscopy  03/08/02    [name]: esophageal stricture,  chronic gerd, s/p dilation.   . Colonoscopy  03/08/02    [name]: diverticulosis, internal hemorrhoids, adenomatous colon polyp  . Esophagogastroduodenoscopy  01/23/04    [name]: esophageal stricture, gastritis, hiatal hernia  . Colonoscopy  01/23/04    [place]: diverticulosis, internal hemorrhoids  . Colonoscopy  09/25/05    [name]: diverticulosis  . Esophagogastroduodenoscopy  09/25/05    [facility]: gastritis, benign bx, no H.pylori  . Esophagogastroduodenoscopy  07/05/09    [facility]: gastropathy, benign small bowel bx and gastric bx  . Colonoscopy  07/05/09    [name]: severe diverticulosis  . Colonoscopy  01/21/11    [name]: severe diverticulosis in sigmoid to desc colon, int hemorrhoids, follow up TCS in 5 years  . Esophageal manometry  03/21/08  [facility]: findings c/w Nutcracker Esophagus    ROS:  General: Negative for anorexia, weight loss, fever, chills, fatigue, weakness. ENT: Negative for hoarseness, difficulty swallowing , nasal congestion. CV: Negative for chest pain, angina, palpitations, dyspnea on exertion, peripheral edema.  Respiratory: Negative for dyspnea at rest, dyspnea on exertion, cough, sputum, wheezing.  GI: See history of present illness. GU:  See hpi Endo: Negative for unusual weight change.    Physical Examination:   BP 122/61 mmHg  Pulse 91  Temp(Src) 97.3 F (36.3 C)  Ht _0  (1.626 m)  Wt 166 lb 3.2 oz (75.388 kg)  BMI 28.51 kg/m2  General: Well-nourished, well-developed in no acute distress.  Eyes: No icterus. Mouth: Oropharyngeal mucosa moist and pink , no lesions erythema or exudate. Lungs: Clear to auscultation bilaterally.  Heart: Regular rate and rhythm, no murmurs rubs or gallops.  Abdomen: Bowel sounds are normal, nontender, nondistended, no hepatosplenomegaly or masses, no abdominal bruits or hernia , no rebound or guarding.   Extremities: No lower extremity edema. No clubbing or deformities. Neuro: Alert and oriented x 4    Skin: Warm and dry, no jaundice.   Psych: Alert and cooperative, normal mood and affect.  Labs:  Heme negative X 3 02/2015  Imaging Studies: No results found.

## 2015-04-18 NOTE — Patient Instructions (Signed)
1. Start imipramine 10 mg at bedtime to prevent esophageal spasms. If tolerated, increase by 10 mg every 5 days until you reach goal of 30 mg at bedtime.  2. Recommend avoiding Midrin while on imipramine as there can be drug interactions. 3. Continue Creon 1-2 with meals, 1 with snacks new prescription provided. 4. I will evaluate her records and determine if you would be a potential hemorrhoid banding candidate. If so we will bring him back to see Dr. Gala Romney in the near future.

## 2015-04-20 NOTE — Assessment & Plan Note (Signed)
Continue Levsin when necessary. May receive some benefit from imipramine as well.

## 2015-04-20 NOTE — Progress Notes (Signed)
CC'ED TO PCP 

## 2015-04-20 NOTE — Assessment & Plan Note (Addendum)
82 year old lady with multiple GI issues including history of nutcracker esophagus, remote esophageal strictures who presents with complaints of esophageal spasms with dysphagia during these episodes. Symptoms becoming more frequent. Takes Librax with some relief. Denies heartburn. No dysphagia at other times. Last EGD in 2011 with gastropathy, benign gastric biopsies.  Discussed at length with patient. Decided on a trial of imipramine at bedtime, may stop Librax within the next 2-3 weeks. If she continues to have ongoing symptoms, would consider possibility of repeat EGD for further evaluation, exclude complicated GERD.  Of note, patient had Midrin listed on her medication list, this can interfere with imipramine. She states that she rarely uses it. I advised her not to take Midrin while on imipramine.

## 2015-04-20 NOTE — Assessment & Plan Note (Signed)
Patient has a history of internal hemorrhoids based on last colonoscopy in 2012. She describes prolapsing hemorrhoid that goes back in on its own. Previously had hemorrhoid banding in the remote past and is interested in pursuing banding at this time. Consider one last colonoscopy December 2017 for history of adenomatous colon polyps.  We will bring her back on Dr. Gala Romney schedule for possible [facility] banding.

## 2015-04-20 NOTE — Assessment & Plan Note (Signed)
Continue Creon as before. May increase dose with unavoidable fatty meals up to 2-3 capsules, may use 1 with snacks.Prescription provided.

## 2015-04-20 NOTE — Progress Notes (Signed)
Please let patient know that she should be a candidate for [place] banding looking at her last TCS report. I have discussed with Dr. Gala Romney and he agrees with evaluation and possible banding if appropriate.   She will also need follow up of her "esophageal spasms" and new medication and if not better, may need EGD or other evaluation.   PER RMR, SCHEDULE PATIENT FOR [facility] BANDING AND FOLLOW UP APPOINTMENT (ONE HOUR SLOT). She was just seen, so lets try to get her back in in about 6 weeks.

## 2015-04-21 ENCOUNTER — Telehealth: Payer: Self-pay | Admitting: Internal Medicine

## 2015-04-21 NOTE — Telephone Encounter (Signed)
[phone]   LAST NIGHT PATIENT STATES THAT SHE HAD VERY BAD BLOATING AND WAS IN THE ER LAST AUGUST WITH IT.  WANTS TO KNOW IF SHE NEEDS TO HAVE HER BLOOD CHECKED FOR ECOLI   CAN THE BLOODWORK FROM THE ER BE LOOKED AT? HAS HAD ECOLI IN HER URINE BEFORE.

## 2015-04-21 NOTE — Telephone Encounter (Signed)
Routing to [facility] 

## 2015-04-21 NOTE — Telephone Encounter (Addendum)
Tried to call patient. Left message on phone.   Reviewed her records from ER visit in 09/2014. CT A/P reviewed without explanation for her symptoms. Labs ok. No need to check blood for Ecoli given symptoms of bloating.   She is actively being treated for [facility] in urine per [facility]. Complete treatment per there instructions.   Please call if ongoing problems.

## 2015-04-21 NOTE — Progress Notes (Signed)
APPT MADE AND PATIENT CALLED WITH DATE AND TIME  °

## 2015-04-24 ENCOUNTER — Telehealth: Payer: Self-pay | Admitting: Internal Medicine

## 2015-04-24 NOTE — Telephone Encounter (Signed)
[place], WILL BE HOME UNTIL 11 REGARDING LAST Friday PHONE MESSAGE FROM [facility]

## 2015-04-25 DIAGNOSIS — H353221 Exudative age-related macular degeneration, left eye, with active choroidal neovascularization: Secondary | ICD-10-CM | POA: Diagnosis not present

## 2015-04-27 MED ORDER — OMEPRAZOLE 20 MG PO CPDR: 20.0000 mg | DELAYED_RELEASE_CAPSULE | Freq: Every day | ORAL | Status: DC

## 2015-04-27 NOTE — Addendum Note (Signed)
Addended by: Mahala Menghini on: 04/27/2015 04:29 PM   Modules accepted: Orders

## 2015-04-27 NOTE — Telephone Encounter (Signed)
Spoke with the pt- she said she normally has bloating off and on but recently its been everyday. Within 15-30 minutes after eating she has a pain in her abd. She will normally take some tums and get a heating pad and after a little while the pain will stop. She said it feels like an "attack". She has some nausea at bedtime, no vomiting. bm's are ok and regular as long as she takes her probiotics. No blood in her stool. No fever. She is taking creon as prescribed and levsin prn. She is not taking librax.  She is concerned that something is wrong with her stomach. She is going to a softball game this evening for her grandson and has an appointment at [employer]. She will be home in the morning until around 10:30

## 2015-04-27 NOTE — Telephone Encounter (Signed)
We have two options: Trial of weight based creon, minimum of 3 with meals and snacks to see if this helps postprandial bloating OR If her postprandial pain is more upper abdomen, then we could offer her EGD.   She also needs to be on a PPI to see if that will calm her pp symptoms down. She does have h/o gastritis in past. Omeprazole sent in to pharmacy.

## 2015-04-27 NOTE — Telephone Encounter (Signed)
Spoke with the pt, see other phone note.  

## 2015-04-28 ENCOUNTER — Ambulatory Visit (INDEPENDENT_AMBULATORY_CARE_PROVIDER_SITE_OTHER): Payer: Medicare Other | Admitting: Urology

## 2015-04-28 DIAGNOSIS — N3010 Interstitial cystitis (chronic) without hematuria: Secondary | ICD-10-CM | POA: Diagnosis not present

## 2015-04-28 NOTE — Telephone Encounter (Signed)
Called pt LMOM to call office back 

## 2015-04-28 NOTE — Telephone Encounter (Signed)
Pt is aware. She said it was upper abd pain and would like to have an EGD. Please schedule.

## 2015-05-01 ENCOUNTER — Other Ambulatory Visit: Payer: Self-pay

## 2015-05-01 DIAGNOSIS — R109 Unspecified abdominal pain: Secondary | ICD-10-CM

## 2015-05-01 NOTE — Telephone Encounter (Signed)
Instructions mailed.

## 2015-05-01 NOTE — Telephone Encounter (Signed)
Pt called office this morning and is set for EGD on 03/27 at 1:00pm.    Pt is aware instructions to be mailed

## 2015-05-05 DIAGNOSIS — N3000 Acute cystitis without hematuria: Secondary | ICD-10-CM | POA: Diagnosis not present

## 2015-05-15 ENCOUNTER — Encounter (HOSPITAL_COMMUNITY)
Admission: RE | Disposition: A | Discharge status: Home / Self Care | Payer: Self-pay | Source: Ambulatory Visit | Attending: Internal Medicine

## 2015-05-15 ENCOUNTER — Encounter (HOSPITAL_COMMUNITY): Payer: Self-pay

## 2015-05-15 ENCOUNTER — Ambulatory Visit (HOSPITAL_COMMUNITY)
Admission: RE | Admit: 2015-05-15 | Discharge: 2015-05-15 | Disposition: A | Discharge status: Home / Self Care | Payer: Medicare Other | Source: Ambulatory Visit | Attending: Internal Medicine | Admitting: Internal Medicine

## 2015-05-15 DIAGNOSIS — K3189 Other diseases of stomach and duodenum: Secondary | ICD-10-CM | POA: Diagnosis not present

## 2015-05-15 DIAGNOSIS — Z79899 Other long term (current) drug therapy: Secondary | ICD-10-CM | POA: Insufficient documentation

## 2015-05-15 DIAGNOSIS — Z7951 Long term (current) use of inhaled steroids: Secondary | ICD-10-CM | POA: Insufficient documentation

## 2015-05-15 DIAGNOSIS — R1013 Epigastric pain: Secondary | ICD-10-CM | POA: Diagnosis not present

## 2015-05-15 DIAGNOSIS — E119 Type 2 diabetes mellitus without complications: Secondary | ICD-10-CM | POA: Diagnosis not present

## 2015-05-15 DIAGNOSIS — K224 Dyskinesia of esophagus: Secondary | ICD-10-CM | POA: Insufficient documentation

## 2015-05-15 DIAGNOSIS — Z7984 Long term (current) use of oral hypoglycemic drugs: Secondary | ICD-10-CM | POA: Diagnosis not present

## 2015-05-15 DIAGNOSIS — K219 Gastro-esophageal reflux disease without esophagitis: Secondary | ICD-10-CM | POA: Diagnosis not present

## 2015-05-15 DIAGNOSIS — E785 Hyperlipidemia, unspecified: Secondary | ICD-10-CM | POA: Insufficient documentation

## 2015-05-15 DIAGNOSIS — Z7982 Long term (current) use of aspirin: Secondary | ICD-10-CM | POA: Insufficient documentation

## 2015-05-15 DIAGNOSIS — I10 Essential (primary) hypertension: Secondary | ICD-10-CM | POA: Diagnosis not present

## 2015-05-15 DIAGNOSIS — K2970 Gastritis, unspecified, without bleeding: Secondary | ICD-10-CM | POA: Insufficient documentation

## 2015-05-15 DIAGNOSIS — K319 Disease of stomach and duodenum, unspecified: Secondary | ICD-10-CM | POA: Diagnosis not present

## 2015-05-15 DIAGNOSIS — Z96651 Presence of right artificial knee joint: Secondary | ICD-10-CM | POA: Insufficient documentation

## 2015-05-15 DIAGNOSIS — M1991 Primary osteoarthritis, unspecified site: Secondary | ICD-10-CM | POA: Insufficient documentation

## 2015-05-15 DIAGNOSIS — R109 Unspecified abdominal pain: Secondary | ICD-10-CM

## 2015-05-15 HISTORY — DX: Nausea with vomiting, unspecified: R11.2

## 2015-05-15 HISTORY — DX: Other specified postprocedural states: Z98.890

## 2015-05-15 HISTORY — PX: ESOPHAGOGASTRODUODENOSCOPY: SHX5428

## 2015-05-15 LAB — GLUCOSE, CAPILLARY: Glucose-Capillary: 92 mg/dL (ref 65–99)

## 2015-05-15 SURGERY — EGD (ESOPHAGOGASTRODUODENOSCOPY)
Anesthesia: Moderate Sedation

## 2015-05-15 MED ORDER — LIDOCAINE VISCOUS 2 % MT SOLN
OROMUCOSAL | Status: DC | PRN
  Administered 2015-05-15: 1 via OROMUCOSAL

## 2015-05-15 MED ORDER — MIDAZOLAM HCL 5 MG/5ML IJ SOLN
INTRAMUSCULAR | Status: DC | PRN
  Administered 2015-05-15: 2 mg via INTRAVENOUS
  Administered 2015-05-15: 0.5 mg via INTRAVENOUS
  Administered 2015-05-15: 1 mg via INTRAVENOUS

## 2015-05-15 MED ORDER — MIDAZOLAM HCL 5 MG/5ML IJ SOLN
INTRAMUSCULAR | Status: AC
  Filled 2015-05-15: qty 10

## 2015-05-15 MED ORDER — LIDOCAINE VISCOUS 2 % MT SOLN
OROMUCOSAL | Status: AC
  Filled 2015-05-15: qty 15

## 2015-05-15 MED ORDER — MEPERIDINE HCL 100 MG/ML IJ SOLN
INTRAMUSCULAR | Status: DC | PRN
  Administered 2015-05-15: 25 mg via INTRAVENOUS

## 2015-05-15 MED ORDER — ONDANSETRON HCL 4 MG/2ML IJ SOLN
INTRAMUSCULAR | Status: DC | PRN
  Administered 2015-05-15: 4 mg via INTRAVENOUS

## 2015-05-15 MED ORDER — MEPERIDINE HCL 100 MG/ML IJ SOLN
INTRAMUSCULAR | Status: AC
  Filled 2015-05-15: qty 2

## 2015-05-15 MED ORDER — SODIUM CHLORIDE 0.9 % IV SOLN
INTRAVENOUS | Status: DC
  Administered 2015-05-15: 13:00:00 via INTRAVENOUS

## 2015-05-15 MED ORDER — ONDANSETRON HCL 4 MG/2ML IJ SOLN
INTRAMUSCULAR | Status: AC
  Filled 2015-05-15: qty 2

## 2015-05-15 NOTE — Interval H&P Note (Signed)
History and Physical Interval Note:  05/15/2015 1:16 PM  [place]  has presented today for surgery, with the diagnosis of abdominal pain  The various methods of treatment have been discussed with the patient and family. After consideration of risks, benefits and other options for treatment, the patient has consented to  Procedure(s) with comments: ESOPHAGOGASTRODUODENOSCOPY (EGD) (N/A) - 1300 as a surgical intervention .  The patient's history has been reviewed, patient examined, no change in status, stable for surgery.  I have reviewed the patient's chart and labs.  Questions were answered to the patient's satisfaction.     Manus Rudd  Patient reports all symptoms have improved with imipramine and omeprazole. No dysphagia currently. Therefore, diagnostic EGD per plan today.  The risks, benefits, limitations, alternatives and imponderables have been reviewed with the patient. Potential for esophageal dilation, biopsy, etc. have also been reviewed.  Questions have been answered. All parties agreeable.

## 2015-05-15 NOTE — Discharge Instructions (Signed)
EGD Discharge instructions Please read the instructions outlined below and refer to this sheet in the next few weeks. These discharge instructions provide you with general information on caring for yourself after you leave the hospital. Your doctor may also give you specific instructions. While your treatment has been planned according to the most current medical practices available, unavoidable complications occasionally occur. If you have any problems or questions after discharge, please call your doctor. ACTIVITY  You may resume your regular activity but move at a slower pace for the next 24 hours.   Take frequent rest periods for the next 24 hours.   Walking will help expel (get rid of) the air and reduce the bloated feeling in your abdomen.   No driving for 24 hours (because of the anesthesia (medicine) used during the test).   You may shower.   Do not sign any important legal documents or operate any machinery for 24 hours (because of the anesthesia used during the test).  NUTRITION  Drink plenty of fluids.   You may resume your normal diet.   Begin with a light meal and progress to your normal diet.   Avoid alcoholic beverages for 24 hours or as instructed by your caregiver.  MEDICATIONS  You may resume your normal medications unless your caregiver tells you otherwise.  WHAT YOU CAN EXPECT TODAY  You may experience abdominal discomfort such as a feeling of fullness or gas pains.  FOLLOW-UP  Your doctor will discuss the results of your test with you.  SEEK IMMEDIATE MEDICAL ATTENTION IF ANY OF THE FOLLOWING OCCUR:  Excessive nausea (feeling sick to your stomach) and/or vomiting.   Severe abdominal pain and distention (swelling).   Trouble swallowing.   Temperature over 101 F (37.8 C).   Rectal bleeding or vomiting of blood.     Continue omeprazole, imipramine and pancreatic enzymes  Further recommendations to follow pending review of pathology report  Keep  office visit with [place] for hemorrhoid banding in the near future

## 2015-05-15 NOTE — H&P (View-Only) (Signed)
Primary Care Physician: Mickie Hillier, MD  Primary Gastroenterologist:  Garfield Cornea, MD   Chief Complaint  Patient presents with  . Follow-up  . esophageal spasm  . Gastroesophageal Reflux    HPI: Deborah Gray is a 82 y.o. female here for short interval follow-up due to increased issues with esophageal spasms. We first met the patient back in December when she transferred her care to our practice. She has a history of chronic functional GI symptoms, suspected idiopathic chronic pancreatitis, Nutcracker's esophagus, with extensive workup at [facility] and at [facility] as outlined in my 01/30/2015 office note.  Patient reports increased frequency of esophageal spasms. Recently had to request a refill on her Librax, was actually filled bile about our GI surprisingly (given that she transferred her care). She is aware that she can call us in the future for issues or refills. She notes taking Librax does seem to help with the spasms. Continues to have feelings that food is not going down and she is choking when it occurs. Has been waking up in the middle the night with a lot of burning and abdominal bloating. Continues to use levsin as needed for postprandial abdominal spasms loose stools but does not have to use often. She notes that she does not take the Librax and Levsin at the same time. She has taken my advice to increase Creon especially when she's having abdominal pain or eating fatty meals. This seems to have helped. He notes she is running short on her prescription and needs to have it rewritten with new instructions.  She is very concerned about recurrent UTIs. She has had a significant amount of difficulty getting rid of a recent UTI, followed by Dr. Jeffie Pollock and Dr. Wolfgang Phoenix. She has a history of interstitial cystitis as well. She spent the first 20 minutes of the appointment detailing these recent events down to every detail of difficulty talking with the staff over the phone and  making appointments. Initially given cipro BID by PCP. Had to change to [facility] for ongoing symptoms. Recently received call that she needed to extend her Macrobid another 7 days based on culture results. Continues to receive IC bladder instillations. Wonders if her hemorrhoids are contributing to her recurrent UTI because she has difficulty getting herself clean. She has had hemorrhoid banding in the remote past but when she went back to have an additional banding, the new surgeon at the facility refused to do it stating she would have to much pain. She is interested in possible [place] banding with Dr. Gala Romney.       Current Outpatient Prescriptions  Medication Sig Dispense Refill  . aspirin 81 MG tablet Take 81 mg by mouth at bedtime.      . clidinium-chlordiazePOXIDE (LIBRAX) 5-2.5 MG capsule Take 1 tab every 6-8 hours as needed for abdominal spasms. 90 capsule 1  . fluticasone (FLONASE) 50 MCG/ACT nasal spray Place 2 sprays into both nostrils daily. 16 g 11  . hyoscyamine (LEVSIN/SL) 0.125 MG SL tablet Place 1 tablet (0.125 mg total) under the tongue every 4 (four) hours as needed. 90 tablet 1  . isometheptene-acetaminophen-dichloralphenazone (MIDRIN) 65-100-325 MG capsule One po QID prn headaches 30 capsule 0  . Ketotifen Fumarate (THERA TEARS ALLERGY OP) Apply to eye.    . levocetirizine (XYZAL) 5 MG tablet Take 5 mg by mouth every evening. Reported on 03/16/2015    . metFORMIN (GLUCOPHAGE) 500 MG tablet Take 1 tablet (500 mg total) by mouth  2 (two) times daily. 180 tablet 3  . mirabegron ER (MYRBETRIQ) 25 MG TB24 tablet Take 25 mg by mouth daily.    . nateglinide (STARLIX) 120 MG tablet TAKE 1 TABLET BY MOUTH 3 TIMES A DAY BEFORE MEALS 270 tablet 3  . nitrofurantoin, macrocrystal-monohydrate, (MACROBID) 100 MG capsule Take 1 capsule (100 mg total) by mouth 2 (two) times daily. for 7 days 14 capsule 0  . Omega-3 Fatty Acids (FISH OIL) 1200 MG CAPS Take 1 capsule by mouth. Take 4 capsules daily.       . Pancrelipase, Lip-Prot-Amyl, (CREON) 24000 UNITS CPEP Take one capsule 3 times a day with meals 270 capsule 3  . phenazopyridine (PYRIDIUM) 97 MG tablet Take 1 tablet (97 mg total) by mouth 3 (three) times daily as needed for pain. 21 tablet 0  . Probiotic Product (ALIGN) 4 MG CAPS Take 1 capsule by mouth every other day.    . valsartan (DIOVAN) 80 MG tablet TAKE 1 TABLET (80 MG TOTAL) BY MOUTH DAILY. 90 tablet 3  . VITAMIN E PO Take 1 capsule by mouth. Four times per week    . Vitamin Mixture (ESTER-C) 500-60 MG TABS Take by mouth. 1 tab three times weekly      . ZETIA 10 MG tablet TAKE 1 TABLET (10 MG TOTAL) BY MOUTH DAILY. 30 tablet 5   No current facility-administered medications for this visit.    Allergies as of 04/18/2015 - Review Complete 04/18/2015  Allergen Reaction Noted  . Estrogens  10/15/2012  . Other  09/01/2013  . Sulfonamide derivatives  03/01/2008   Past Medical History  Diagnosis Date  . Type 2 diabetes mellitus ([place])   . Dysphagia, pharyngoesophageal phase   . Esophageal reflux   . Constipation   . Benign neoplasm of colon   . Internal hemorrhoids without mention of complication   . Diaphragmatic hernia without mention of obstruction or gangrene   . Stricture and stenosis of esophagus   . Unspecified gastritis and gastroduodenitis without mention of hemorrhage   . Diverticulosis of colon (without mention of hemorrhage)   . Essential hypertension   . Arthritis   . Heart murmur   . Hyperlipidemia    Past Surgical History  Procedure Laterality Date  . Dilation and curettage of uterus    . Cholecystectomy    . Laparoscopic vaginal hysterectomy    . Rectocele repair    . Breast lumpectomy Right     benign  . Total knee arthroplasty Right   . Foot surgery Left   . Abdominal hysterectomy    . Cataract extraction Bilateral 2006    Implants in both, surgeries done 6 weeks apart  . Esophagogastroduodenoscopy  03/08/02    [name]: esophageal stricture,  chronic gerd, s/p dilation.   . Colonoscopy  03/08/02    [name]: diverticulosis, internal hemorrhoids, adenomatous colon polyp  . Esophagogastroduodenoscopy  01/23/04    [name]: esophageal stricture, gastritis, hiatal hernia  . Colonoscopy  01/23/04    [facility]: diverticulosis, internal hemorrhoids  . Colonoscopy  09/25/05    [name]: diverticulosis  . Esophagogastroduodenoscopy  09/25/05    [facility]: gastritis, benign bx, no H.pylori  . Esophagogastroduodenoscopy  07/05/09    [facility]: gastropathy, benign small bowel bx and gastric bx  . Colonoscopy  07/05/09    [name]: severe diverticulosis  . Colonoscopy  01/21/11    [name]: severe diverticulosis in sigmoid to desc colon, int hemorrhoids, follow up TCS in 5 years  . Esophageal manometry  03/21/08  [facility]: findings c/w Nutcracker Esophagus    ROS:  General: Negative for anorexia, weight loss, fever, chills, fatigue, weakness. ENT: Negative for hoarseness, difficulty swallowing , nasal congestion. CV: Negative for chest pain, angina, palpitations, dyspnea on exertion, peripheral edema.  Respiratory: Negative for dyspnea at rest, dyspnea on exertion, cough, sputum, wheezing.  GI: See history of present illness. GU:  See hpi Endo: Negative for unusual weight change.    Physical Examination:   BP 122/61 mmHg  Pulse 91  Temp(Src) 97.3 F (36.3 C)  Ht _0  (1.626 m)  Wt 166 lb 3.2 oz (75.388 kg)  BMI 28.51 kg/m2  General: Well-nourished, well-developed in no acute distress.  Eyes: No icterus. Mouth: Oropharyngeal mucosa moist and pink , no lesions erythema or exudate. Lungs: Clear to auscultation bilaterally.  Heart: Regular rate and rhythm, no murmurs rubs or gallops.  Abdomen: Bowel sounds are normal, nontender, nondistended, no hepatosplenomegaly or masses, no abdominal bruits or hernia , no rebound or guarding.   Extremities: No lower extremity edema. No clubbing or deformities. Neuro: Alert and oriented x 4    Skin: Warm and dry, no jaundice.   Psych: Alert and cooperative, normal mood and affect.  Labs:  Heme negative X 3 02/2015  Imaging Studies: No results found.

## 2015-05-15 NOTE — Op Note (Signed)
[facility] Patient Name: Deborah Gray Procedure Date: 05/15/2015 1:08 PM MRN: [id number] Date of Birth: 1934-02-03 Attending MD: Norvel Richards , MD CSN: [id number] Age: 82 Admit Type: Outpatient Procedure:                Upper GI endoscopy Indications:              Epigastric abdominal pain Providers:                Norvel Richards, MD, Janeece Riggers, RN, Randa Spike, Technician Referring MD:             Rosemary Holms M.D. , MD (Referring MD) Medicines:                Midazolam 3.5 mg IV, Meperidine 25 mg IV,                            Ondansetron 4 mg IV Complications:            No immediate complications. Estimated Blood Loss:     Estimated blood loss was minimal. Procedure:                Pre-Anesthesia Assessment:                           - Prior to the procedure, a History and Physical                            was performed, and patient medications and                            allergies were reviewed. The patient's tolerance of                            previous anesthesia was also reviewed. The risks                            and benefits of the procedure and the sedation                            options and risks were discussed with the patient.                            All questions were answered, and informed consent                            was obtained. Prior Anticoagulants: The patient has                            taken no previous anticoagulant or antiplatelet                            agents. ASA Grade Assessment: II - A patient with  mild systemic disease. After reviewing the risks                            and benefits, the patient was deemed in                            satisfactory condition to undergo the procedure.                           After obtaining informed consent, the endoscope was                            passed under direct vision. Throughout the                   procedure, the patient's blood pressure, pulse, and                            oxygen saturations were monitored continuously. The                            [id number]) was introduced through the                            mouth, and advanced to the second part of duodenum.                            The upper GI endoscopy was accomplished without                            difficulty. The patient tolerated the procedure                            well. Scope In: 1:30:12 PM Scope Out: 1:34:14 PM Total Procedure Duration: 0 hours 4 minutes 2 seconds  Findings:      The second portion of the duodenum was normal.      Diffuse moderate inflammation characterized by congestion (edema),       erythema and linear erosions was found in the entire examined stomach.       Biopsies were taken with a cold forceps for histology.      The examined esophagus was normal. Impression:               - Normal second portion of the duodenum.                           - Gastritis. Biopsied. Moderate Sedation:      Moderate (conscious) sedation was administered by the endoscopy nurse       and supervised by the endoscopist. The following parameters were       monitored: oxygen saturation, heart rate, blood pressure, respiratory       rate, EKG, adequacy of pulmonary ventilation, and response to care.       Total physician intraservice time was 13 minutes. Recommendation:           - Continue present medications.                           -  Advance diet as tolerated today.                           - Continue present medications.                           - Patient has a contact number available for                            emergencies. The signs and symptoms of potential                            delayed complications were discussed with the                            patient. Return to normal activities tomorrow.                            Written discharge instructions were  provided to the                            patient.                           - Written discharge instructions were provided to                            the patient.                           - Advance diet as tolerated today.                           - Continue present medications.                           - Return to GI office as previously scheduled. Procedure Code(s):        --- Professional ---                           [id number], Moderate sedation services provided by the                            same physician or other qualified health care                            professional performing the diagnostic or                            therapeutic service that the sedation supports,                            requiring the presence of an independent trained                            observer to assist in the  monitoring of the                            patient's level of consciousness and physiological                            status; initial 15 minutes of intraservice time,                            patient age 51 years or older Diagnosis Code(s):        --- Professional ---                           K29.70, Gastritis, unspecified, without bleeding                           R10.13, Epigastric pain CPT copyright 2016 American Medical Association. All rights reserved. The codes documented in this report are preliminary and upon coder review may  be revised to meet current compliance requirements. Cristopher Estimable. Eliott Amparan, MD Norvel Richards, MD 05/15/2015 2:07:11 PM This report has been signed electronically. Number of Addenda: 0

## 2015-05-16 ENCOUNTER — Other Ambulatory Visit: Payer: Self-pay | Admitting: Family Medicine

## 2015-05-19 ENCOUNTER — Encounter (HOSPITAL_COMMUNITY): Payer: Self-pay | Admitting: Internal Medicine

## 2015-05-19 ENCOUNTER — Other Ambulatory Visit: Payer: Self-pay | Admitting: Family Medicine

## 2015-05-19 ENCOUNTER — Ambulatory Visit (INDEPENDENT_AMBULATORY_CARE_PROVIDER_SITE_OTHER): Payer: Medicare Other | Admitting: Urology

## 2015-05-19 DIAGNOSIS — N3010 Interstitial cystitis (chronic) without hematuria: Secondary | ICD-10-CM

## 2015-05-19 DIAGNOSIS — R3989 Other symptoms and signs involving the genitourinary system: Secondary | ICD-10-CM

## 2015-05-19 DIAGNOSIS — N3000 Acute cystitis without hematuria: Secondary | ICD-10-CM | POA: Diagnosis not present

## 2015-05-20 DIAGNOSIS — E119 Type 2 diabetes mellitus without complications: Secondary | ICD-10-CM | POA: Diagnosis not present

## 2015-05-21 ENCOUNTER — Encounter: Payer: Self-pay | Admitting: Internal Medicine

## 2015-05-25 DIAGNOSIS — H353221 Exudative age-related macular degeneration, left eye, with active choroidal neovascularization: Secondary | ICD-10-CM | POA: Diagnosis not present

## 2015-05-31 ENCOUNTER — Ambulatory Visit (INDEPENDENT_AMBULATORY_CARE_PROVIDER_SITE_OTHER): Payer: Medicare Other | Admitting: Urology

## 2015-05-31 DIAGNOSIS — N3010 Interstitial cystitis (chronic) without hematuria: Secondary | ICD-10-CM | POA: Diagnosis not present

## 2015-05-31 DIAGNOSIS — R300 Dysuria: Secondary | ICD-10-CM | POA: Diagnosis not present

## 2015-05-31 DIAGNOSIS — R3989 Other symptoms and signs involving the genitourinary system: Secondary | ICD-10-CM | POA: Diagnosis not present

## 2015-05-31 DIAGNOSIS — N3000 Acute cystitis without hematuria: Secondary | ICD-10-CM | POA: Diagnosis not present

## 2015-06-06 ENCOUNTER — Encounter: Payer: Self-pay | Admitting: Internal Medicine

## 2015-06-06 ENCOUNTER — Ambulatory Visit (INDEPENDENT_AMBULATORY_CARE_PROVIDER_SITE_OTHER): Payer: Medicare Other | Admitting: Internal Medicine

## 2015-06-06 VITALS — BP 139/72 | HR 93 | Temp 96.9°F | Ht 65.0 in | Wt 168.2 lb

## 2015-06-06 DIAGNOSIS — K648 Other hemorrhoids: Secondary | ICD-10-CM | POA: Diagnosis not present

## 2015-06-06 DIAGNOSIS — K224 Dyskinesia of esophagus: Secondary | ICD-10-CM

## 2015-06-06 NOTE — Patient Instructions (Signed)
Avoid straining.  Benefiber 1 tablespoon twice daily  Limit toilet time to 5 minutes  Call with any interim problems  Schedule followup appointment in 4 weeks from now   

## 2015-06-06 NOTE — Progress Notes (Signed)
[facility] banding procedure note:  The patient presents with symptomatic grade 2 hemorrhoids  -   unresponsive to maximal medical therapy -  requesting rubber band ligation of his/her hemorrhoidal disease. All risks, benefits, and alternative forms of therapy were described and informed consent was obtained.  In the left lateral decubitus position, DRE revealed no abnormalities. anoscopy demonstrated a prominent right anterior hemorrhoid column.  The decision was made to band the right anterior internal hemorrhoid; the [place] was used to perform band ligation without complication. Digital anorectal examination was then performed to assure proper positioning of the band;  Band found to be in excellent position. No pinching or pain to The patient was discharged home without pain or other issues. Dietary and behavioral recommendations were given along with follow-up instructions. The patient will return in 4 weeks and possible additional banding as required.  No complications were encountered and the patient tolerated the procedure well.  Patient states esophageal spasm much better on imipramine 10 mg at bedtime. She went to 2 tablets. Felt it was too much. She went back to 1 tablet. Doing well.

## 2015-06-14 ENCOUNTER — Ambulatory Visit (INDEPENDENT_AMBULATORY_CARE_PROVIDER_SITE_OTHER): Payer: Medicare Other | Admitting: Urology

## 2015-06-14 DIAGNOSIS — R3989 Other symptoms and signs involving the genitourinary system: Secondary | ICD-10-CM

## 2015-06-14 DIAGNOSIS — N3010 Interstitial cystitis (chronic) without hematuria: Secondary | ICD-10-CM | POA: Diagnosis not present

## 2015-06-14 DIAGNOSIS — N3000 Acute cystitis without hematuria: Secondary | ICD-10-CM

## 2015-06-14 DIAGNOSIS — N390 Urinary tract infection, site not specified: Secondary | ICD-10-CM | POA: Diagnosis not present

## 2015-06-20 DIAGNOSIS — N390 Urinary tract infection, site not specified: Secondary | ICD-10-CM | POA: Diagnosis not present

## 2015-06-22 DIAGNOSIS — H353221 Exudative age-related macular degeneration, left eye, with active choroidal neovascularization: Secondary | ICD-10-CM | POA: Diagnosis not present

## 2015-06-27 ENCOUNTER — Other Ambulatory Visit: Payer: Self-pay | Admitting: Gastroenterology

## 2015-07-05 ENCOUNTER — Ambulatory Visit: Payer: Medicare Other | Admitting: Urology

## 2015-07-05 DIAGNOSIS — N390 Urinary tract infection, site not specified: Secondary | ICD-10-CM | POA: Diagnosis not present

## 2015-07-05 DIAGNOSIS — N3010 Interstitial cystitis (chronic) without hematuria: Secondary | ICD-10-CM | POA: Diagnosis not present

## 2015-07-08 DIAGNOSIS — E119 Type 2 diabetes mellitus without complications: Secondary | ICD-10-CM | POA: Diagnosis not present

## 2015-07-11 ENCOUNTER — Ambulatory Visit (INDEPENDENT_AMBULATORY_CARE_PROVIDER_SITE_OTHER): Payer: Medicare Other | Admitting: Internal Medicine

## 2015-07-11 ENCOUNTER — Ambulatory Visit: Payer: Medicare Other | Admitting: Internal Medicine

## 2015-07-11 ENCOUNTER — Encounter: Payer: Self-pay | Admitting: Internal Medicine

## 2015-07-11 VITALS — BP 140/79 | HR 96 | Temp 97.0°F | Ht 64.0 in | Wt 167.0 lb

## 2015-07-11 DIAGNOSIS — K219 Gastro-esophageal reflux disease without esophagitis: Secondary | ICD-10-CM

## 2015-07-11 DIAGNOSIS — K648 Other hemorrhoids: Secondary | ICD-10-CM

## 2015-07-11 DIAGNOSIS — K641 Second degree hemorrhoids: Secondary | ICD-10-CM | POA: Diagnosis not present

## 2015-07-11 DIAGNOSIS — K224 Dyskinesia of esophagus: Secondary | ICD-10-CM

## 2015-07-11 NOTE — Progress Notes (Signed)
[place] banding procedure note:  The patient presents with symptomatic grade 2 hemorrhoids - unresponsive to maximal medical therapy, requesting rubber band ligation of her hemorrhoidal disease. Status post banding of the right anterior column previously. States her symptoms have improved significantly across the board.   GERD and esophageal spasm under control taking imipramine 1-2 tablets at night along with Prilosec daily.  All risks, benefits, and alternative forms of therapy were described and informed consent was obtained.  In the left lateral decubitus position, a DRE revealed no abnormalities.  The decision was made to band the left lateral internal hemorrhoid and the [place] was used to perform band ligation without complication. Digital anorectal examination was then performed to assure proper positioning of the band and to adjust the banded tissue as required. The patient was discharged home without pain or other issues. Dietary and behavioral recommendations were given necessary  along with follow-up instructions. The patient will return in 4weeks for followup and possible additional banding as required.  No complications were encountered and the patient tolerated the procedure well.

## 2015-07-11 NOTE — Patient Instructions (Signed)
Avoid straining.  Benefiber 1 tablespoon twice daily  Limit toilet time to 5 minutes  Call with any interim problems  May alternate imipramine 1 or 2 tablets each evening  Continue Prilosec every day  Schedule followup appointment in 4 weeks from now

## 2015-07-19 ENCOUNTER — Ambulatory Visit: Payer: Medicare Other | Admitting: Urology

## 2015-07-19 DIAGNOSIS — N3020 Other chronic cystitis without hematuria: Secondary | ICD-10-CM | POA: Diagnosis not present

## 2015-07-19 DIAGNOSIS — N390 Urinary tract infection, site not specified: Secondary | ICD-10-CM | POA: Diagnosis not present

## 2015-07-27 DIAGNOSIS — H353221 Exudative age-related macular degeneration, left eye, with active choroidal neovascularization: Secondary | ICD-10-CM | POA: Diagnosis not present

## 2015-08-02 ENCOUNTER — Ambulatory Visit (INDEPENDENT_AMBULATORY_CARE_PROVIDER_SITE_OTHER): Payer: Medicare Other | Admitting: Urology

## 2015-08-02 ENCOUNTER — Other Ambulatory Visit (HOSPITAL_COMMUNITY)
Admission: RE | Admit: 2015-08-02 | Discharge: 2015-08-02 | Disposition: A | Discharge status: Home / Self Care | Payer: Medicare Other | Source: Other Acute Inpatient Hospital | Attending: Urology | Admitting: Urology

## 2015-08-02 ENCOUNTER — Other Ambulatory Visit: Payer: Self-pay | Admitting: Family Medicine

## 2015-08-02 DIAGNOSIS — N3011 Interstitial cystitis (chronic) with hematuria: Secondary | ICD-10-CM

## 2015-08-02 DIAGNOSIS — N3020 Other chronic cystitis without hematuria: Secondary | ICD-10-CM | POA: Diagnosis not present

## 2015-08-02 DIAGNOSIS — N3010 Interstitial cystitis (chronic) without hematuria: Secondary | ICD-10-CM | POA: Diagnosis not present

## 2015-08-02 DIAGNOSIS — R300 Dysuria: Secondary | ICD-10-CM | POA: Diagnosis not present

## 2015-08-02 LAB — URINALYSIS, ROUTINE W REFLEX MICROSCOPIC
BILIRUBIN URINE: NEGATIVE
GLUCOSE, UA: NEGATIVE mg/dL
HGB URINE DIPSTICK: NEGATIVE
KETONES UR: NEGATIVE mg/dL
Leukocytes, UA: NEGATIVE
NITRITE: NEGATIVE
PH: 6 (ref 5.0–8.0)
Protein, ur: NEGATIVE mg/dL
Specific Gravity, Urine: <1.005 — ABNORMAL LOW (ref 1.005–1.030)

## 2015-08-11 ENCOUNTER — Encounter: Payer: Self-pay | Admitting: Internal Medicine

## 2015-08-11 ENCOUNTER — Ambulatory Visit (INDEPENDENT_AMBULATORY_CARE_PROVIDER_SITE_OTHER): Payer: Medicare Other | Admitting: Internal Medicine

## 2015-08-11 VITALS — BP 117/63 | HR 74 | Temp 96.9°F | Ht 64.0 in | Wt 168.4 lb

## 2015-08-11 DIAGNOSIS — K648 Other hemorrhoids: Secondary | ICD-10-CM | POA: Diagnosis not present

## 2015-08-11 NOTE — Progress Notes (Signed)
[place] banding procedure note:  The patient presents with symptomatic grade 2/3 hemorrhoids; status post banding of the right anterior and left lateral hemorrhoid column. She is pleased to say that her symptoms of hygiene difficulties and UTIs have improved already with 2 band placements. Protrusion is not now as much of an issue as it was before. No bleeding. She is desirous of a third band be placed today.  All risks, benefits, and alternative forms of therapy were described and informed consent was obtained.  In the left lateral decubitus position, a DRE revealed no abnormalities.  The decision was made to band the right posterior internal hemorrhoid; the [place] was used to perform band ligation without complication. Digital anorectal examination was then performed to assure proper positioning of the band; band found to be in excellent position. No pinching or pain;  The patient was discharged home without pain or other issues. Dietary and behavioral recommendations were given;  along with follow-up instructions. The patient will return in 3 months.  No complications were encountered and the patient tolerated the procedure well.

## 2015-08-11 NOTE — Patient Instructions (Signed)
Avoid straining.  Benefiber 1 tablespoon  twice daily  Limit toilet time to 5 minutes  Call with any interim problems  Schedule followup appointment in 3 months from now   

## 2015-08-16 ENCOUNTER — Ambulatory Visit (INDEPENDENT_AMBULATORY_CARE_PROVIDER_SITE_OTHER): Payer: Medicare Other | Admitting: Family Medicine

## 2015-08-16 ENCOUNTER — Encounter: Payer: Self-pay | Admitting: Family Medicine

## 2015-08-16 ENCOUNTER — Observation Stay (HOSPITAL_COMMUNITY)
Admission: EM | Admit: 2015-08-16 | Discharge: 2015-08-17 | Disposition: A | Discharge status: Home / Self Care | Payer: Medicare Other | Attending: Internal Medicine | Admitting: Internal Medicine

## 2015-08-16 ENCOUNTER — Emergency Department (HOSPITAL_COMMUNITY): Payer: Medicare Other

## 2015-08-16 ENCOUNTER — Encounter (HOSPITAL_COMMUNITY): Payer: Self-pay | Admitting: Emergency Medicine

## 2015-08-16 VITALS — BP 126/82 | Ht 64.0 in | Wt 166.8 lb

## 2015-08-16 DIAGNOSIS — R0789 Other chest pain: Principal | ICD-10-CM | POA: Insufficient documentation

## 2015-08-16 DIAGNOSIS — E119 Type 2 diabetes mellitus without complications: Secondary | ICD-10-CM | POA: Diagnosis not present

## 2015-08-16 DIAGNOSIS — R079 Chest pain, unspecified: Secondary | ICD-10-CM | POA: Diagnosis present

## 2015-08-16 DIAGNOSIS — Z85038 Personal history of other malignant neoplasm of large intestine: Secondary | ICD-10-CM | POA: Insufficient documentation

## 2015-08-16 DIAGNOSIS — J45909 Unspecified asthma, uncomplicated: Secondary | ICD-10-CM | POA: Insufficient documentation

## 2015-08-16 DIAGNOSIS — Z79899 Other long term (current) drug therapy: Secondary | ICD-10-CM | POA: Diagnosis not present

## 2015-08-16 DIAGNOSIS — E785 Hyperlipidemia, unspecified: Secondary | ICD-10-CM | POA: Insufficient documentation

## 2015-08-16 DIAGNOSIS — I10 Essential (primary) hypertension: Secondary | ICD-10-CM | POA: Diagnosis not present

## 2015-08-16 DIAGNOSIS — R9431 Abnormal electrocardiogram [ECG] [EKG]: Secondary | ICD-10-CM | POA: Diagnosis not present

## 2015-08-16 DIAGNOSIS — K861 Other chronic pancreatitis: Secondary | ICD-10-CM | POA: Diagnosis not present

## 2015-08-16 DIAGNOSIS — R0602 Shortness of breath: Secondary | ICD-10-CM

## 2015-08-16 DIAGNOSIS — Z7984 Long term (current) use of oral hypoglycemic drugs: Secondary | ICD-10-CM | POA: Diagnosis not present

## 2015-08-16 DIAGNOSIS — K219 Gastro-esophageal reflux disease without esophagitis: Secondary | ICD-10-CM | POA: Diagnosis not present

## 2015-08-16 DIAGNOSIS — M1990 Unspecified osteoarthritis, unspecified site: Secondary | ICD-10-CM | POA: Diagnosis not present

## 2015-08-16 DIAGNOSIS — Z7982 Long term (current) use of aspirin: Secondary | ICD-10-CM | POA: Diagnosis not present

## 2015-08-16 LAB — URINALYSIS, ROUTINE W REFLEX MICROSCOPIC
Bilirubin Urine: NEGATIVE
GLUCOSE, UA: NEGATIVE mg/dL
HGB URINE DIPSTICK: NEGATIVE
Ketones, ur: NEGATIVE mg/dL
Leukocytes, UA: NEGATIVE
Nitrite: NEGATIVE
PH: 6 (ref 5.0–8.0)
PROTEIN: NEGATIVE mg/dL
SPECIFIC GRAVITY, URINE: 1.02 (ref 1.005–1.030)

## 2015-08-16 LAB — BASIC METABOLIC PANEL
ANION GAP: 7 (ref 5–15)
BUN: 23 mg/dL — AB (ref 6–20)
CHLORIDE: 105 mmol/L (ref 101–111)
CO2: 25 mmol/L (ref 22–32)
Calcium: 9.3 mg/dL (ref 8.9–10.3)
Creatinine, Ser: 0.66 mg/dL (ref 0.44–1.00)
GFR calc Af Amer: >60 mL/min (ref >60)
GFR calc non Af Amer: >60 mL/min (ref >60)
GLUCOSE: 116 mg/dL — AB (ref 65–99)
POTASSIUM: 4.3 mmol/L (ref 3.5–5.1)
SODIUM: 137 mmol/L (ref 135–145)

## 2015-08-16 LAB — HEPATIC FUNCTION PANEL
ALBUMIN: 4.2 g/dL (ref 3.5–5.0)
ALK PHOS: 62 U/L (ref 38–126)
ALT: 18 U/L (ref 14–54)
AST: 16 U/L (ref 15–41)
BILIRUBIN TOTAL: 0.4 mg/dL (ref 0.3–1.2)
Bilirubin, Direct: <0.1 mg/dL — ABNORMAL LOW (ref 0.1–0.5)
Total Protein: 7.3 g/dL (ref 6.5–8.1)

## 2015-08-16 LAB — CBC
HEMATOCRIT: 40.9 % (ref 36.0–46.0)
HEMOGLOBIN: 13.6 g/dL (ref 12.0–15.0)
MCH: 30.2 pg (ref 26.0–34.0)
MCHC: 33.3 g/dL (ref 30.0–36.0)
MCV: 90.7 fL (ref 78.0–100.0)
Platelets: 238 10*3/uL (ref 150–400)
RBC: 4.51 MIL/uL (ref 3.87–5.11)
RDW: 12.8 % (ref 11.5–15.5)
WBC: 9.3 10*3/uL (ref 4.0–10.5)

## 2015-08-16 LAB — TROPONIN I
Troponin I: <0.03 ng/mL (ref <0.03)
Troponin I: <0.03 ng/mL (ref <0.03)

## 2015-08-16 LAB — LIPASE, BLOOD: LIPASE: 13 U/L (ref 11–51)

## 2015-08-16 LAB — GLUCOSE, CAPILLARY: Glucose-Capillary: 133 mg/dL — ABNORMAL HIGH (ref 65–99)

## 2015-08-16 MED ORDER — NATEGLINIDE 60 MG PO TABS
120.0000 mg | ORAL_TABLET | Freq: Three times a day (TID) | ORAL | Status: DC
  Administered 2015-08-17: 120 mg via ORAL
  Filled 2015-08-16 (×5): qty 1

## 2015-08-16 MED ORDER — PANCRELIPASE (LIP-PROT-AMYL) 12000-38000 UNITS PO CPEP
24000.0000 [IU] | ORAL_CAPSULE | Freq: Three times a day (TID) | ORAL | Status: DC
  Administered 2015-08-17 (×2): 24000 [IU] via ORAL
  Filled 2015-08-16 (×2): qty 2

## 2015-08-16 MED ORDER — TRIMETHOPRIM 100 MG PO TABS
100.0000 mg | ORAL_TABLET | Freq: Every day | ORAL | Status: DC
  Administered 2015-08-16: 100 mg via ORAL
  Filled 2015-08-16 (×2): qty 1

## 2015-08-16 MED ORDER — MORPHINE SULFATE (PF) 2 MG/ML IV SOLN: 2.0000 mg | INTRAVENOUS | Status: DC | PRN

## 2015-08-16 MED ORDER — IMIPRAMINE HCL 10 MG PO TABS
ORAL_TABLET | ORAL | Status: AC
  Filled 2015-08-16: qty 2

## 2015-08-16 MED ORDER — INSULIN ASPART 100 UNIT/ML ~~LOC~~ SOLN
0.0000 [IU] | Freq: Three times a day (TID) | SUBCUTANEOUS | Status: DC
  Administered 2015-08-17: 1 [IU] via SUBCUTANEOUS
  Administered 2015-08-17: 2 [IU] via SUBCUTANEOUS

## 2015-08-16 MED ORDER — MIRABEGRON ER 25 MG PO TB24
25.0000 mg | ORAL_TABLET | Freq: Every day | ORAL | Status: DC
  Administered 2015-08-17: 25 mg via ORAL
  Filled 2015-08-16: qty 1

## 2015-08-16 MED ORDER — ONDANSETRON HCL 4 MG/2ML IJ SOLN: 4.0000 mg | Freq: Four times a day (QID) | INTRAMUSCULAR | Status: DC | PRN

## 2015-08-16 MED ORDER — HYOSCYAMINE SULFATE 0.125 MG SL SUBL: 0.1250 mg | SUBLINGUAL_TABLET | SUBLINGUAL | Status: DC | PRN

## 2015-08-16 MED ORDER — PHENAZOPYRIDINE HCL 100 MG PO TABS
100.0000 mg | ORAL_TABLET | Freq: Every day | ORAL | Status: DC | PRN
Start: 2015-08-16 — End: 2015-08-17

## 2015-08-16 MED ORDER — PANTOPRAZOLE SODIUM 40 MG PO TBEC
40.0000 mg | DELAYED_RELEASE_TABLET | Freq: Every day | ORAL | Status: DC
  Administered 2015-08-17: 40 mg via ORAL
  Filled 2015-08-16: qty 1

## 2015-08-16 MED ORDER — ASPIRIN 81 MG PO CHEW
81.0000 mg | CHEWABLE_TABLET | Freq: Every day | ORAL | Status: DC
  Administered 2015-08-16: 81 mg via ORAL
  Filled 2015-08-16: qty 1

## 2015-08-16 MED ORDER — ACETAMINOPHEN 325 MG PO TABS
650.0000 mg | ORAL_TABLET | ORAL | Status: DC | PRN
  Administered 2015-08-16: 650 mg via ORAL
  Filled 2015-08-16: qty 2

## 2015-08-16 MED ORDER — ENOXAPARIN SODIUM 40 MG/0.4ML ~~LOC~~ SOLN
40.0000 mg | SUBCUTANEOUS | Status: DC
  Administered 2015-08-16: 40 mg via SUBCUTANEOUS
  Filled 2015-08-16: qty 0.4

## 2015-08-16 MED ORDER — NITROFURANTOIN MONOHYD MACRO 100 MG PO CAPS
100.0000 mg | ORAL_CAPSULE | Freq: Every day | ORAL | Status: DC
  Administered 2015-08-16: 100 mg via ORAL
  Filled 2015-08-16 (×2): qty 1

## 2015-08-16 MED ORDER — IMIPRAMINE HCL 10 MG PO TABS
10.0000 mg | ORAL_TABLET | Freq: Every day | ORAL | Status: DC
  Administered 2015-08-16: 20 mg via ORAL
  Filled 2015-08-16 (×2): qty 2

## 2015-08-16 MED ORDER — NITROFURANTOIN MONOHYD MACRO 100 MG PO CAPS
ORAL_CAPSULE | ORAL | Status: AC
  Filled 2015-08-16: qty 1

## 2015-08-16 MED ORDER — INSULIN ASPART 100 UNIT/ML ~~LOC~~ SOLN: 0.0000 [IU] | Freq: Every day | SUBCUTANEOUS | Status: DC

## 2015-08-16 MED ORDER — IRBESARTAN 75 MG PO TABS
75.0000 mg | ORAL_TABLET | Freq: Every day | ORAL | Status: DC
  Administered 2015-08-16 – 2015-08-17 (×2): 75 mg via ORAL
  Filled 2015-08-16 (×2): qty 1

## 2015-08-16 MED ORDER — RISAQUAD PO CAPS
1.0000 | ORAL_CAPSULE | Freq: Every day | ORAL | Status: DC
  Administered 2015-08-16 – 2015-08-17 (×2): 1 via ORAL
  Filled 2015-08-16 (×2): qty 1

## 2015-08-16 MED ORDER — GI COCKTAIL ~~LOC~~: 30.0000 mL | Freq: Four times a day (QID) | ORAL | Status: DC | PRN

## 2015-08-16 MED ORDER — ASPIRIN 81 MG PO CHEW
324.0000 mg | CHEWABLE_TABLET | Freq: Once | ORAL | Status: AC
  Administered 2015-08-16: 324 mg via ORAL
  Filled 2015-08-16: qty 4

## 2015-08-16 MED ORDER — EZETIMIBE 10 MG PO TABS
10.0000 mg | ORAL_TABLET | Freq: Every day | ORAL | Status: DC
  Administered 2015-08-16 – 2015-08-17 (×2): 10 mg via ORAL
  Filled 2015-08-16 (×2): qty 1

## 2015-08-16 NOTE — Progress Notes (Signed)
   Subjective:    Patient ID: Deborah Gray, female    DOB: May 03, 1933, 82 y.o.   MRN: [id number]  HPI Patient arrives with c/o abdominal bloating that puts pressure on her diaphragm and she gets short of breath-esp if lifting up to get something.  Pt felt pressure in the upper abdomen and tingling in the elbows and upper arm discomfort. Felt achiness in the neck and shoulders  Felt better when taking it easy, develps substernal and upper abd pressure, along with substantial sob with any type of exertion   Now just with walking feels a bit out of btreath. No chest pain no chest pressure  Sat night pt was feeling worse and wondered if even she needed to go theo the ER with her sensation  Pt now is on prilosec and takes meds for esoph spasm. Overall has helped just taking o of the spasm meds prn  Just had a hemorrhoid op    Patient notes chest pressure. Substernal. Along with the bloating sensation. Worse with exertion. A couple times she almost went to the emergency room these past several days.  Review of Systems No headache, no major weight loss or weight gain, no new chest pain no back pain abdominal pain no change in bowel habits complete ROS otherwise negative     Objective:   Physical Exam Alert vital stable. Anxious. HEENT normal lungs clear heart regular in rhythm chest wall nontender abdomen benign  EKG Q wave in lead 3 mild ST elevation in 2 and aVF     Assessment & Plan:  Impression acute atypical chest symptomatology with exertion and numerous risk factors and abnormal EKG long discussion held. Plan since emergency room for further evaluation. Spoke with ER doctor.wsl 40 minutes spent most in discussion evaluation and arrangement of urgent transfer of care to emergency room

## 2015-08-16 NOTE — Consult Note (Signed)
Primary cardiologist: Dr Carlyle Dolly MD Consulting cardiologist: Dr Rozann Lesches MD Requesting physician: Dr Ezequiel Essex Indication: chest pain     Clinical Summary Deborah Gray is a 82 y.o.female history of Dm2, dysphagia, GERD, esophageal stricture, HTN, HL presents with chest pain. From prior notes long history of chest and abdominal pain, previous cariolite in 2006 and 2015 without ischemia. Echo 11/2013 LVEF 65-70%, gradie I diastolic dysfunction.  She reports a chronic history of intermittent epigastric bloating and pain which she is followed by GI for. Most recently she has had somewhat different symptoms. She describes episodes of a "funny feeling" in her neck radiating down to both shoulders and elbows. Typically occurs with exertion, can have some associated SOB. Can be worst with position or reaching with either arm. Lasts a few minutes. Can get diaphoretic. Seen in pcp office, EKG done with initial question about ST elevation, but my review findings are not consistent with STEMI.   Labs pending CXR pending EKG: SR, old inferior Q-waves, no acute ischemic changes    Allergies  Allergen Reactions  . Estrogens Other (See Comments)    Migraines   . Other     Adhesive tape, blisters skin  . Sulfonamide Derivatives Rash    Medications Scheduled Medications:    Infusions:    PRN Medications:     Past Medical History  Diagnosis Date  . Type 2 diabetes mellitus ([place])   . Dysphagia, pharyngoesophageal phase   . Esophageal reflux   . Constipation   . Benign neoplasm of colon   . Internal hemorrhoids without mention of complication   . Diaphragmatic hernia without mention of obstruction or gangrene   . Stricture and stenosis of esophagus   . Unspecified gastritis and gastroduodenitis without mention of hemorrhage   . Diverticulosis of colon (without mention of hemorrhage)   . Essential hypertension   . Arthritis   . Heart murmur   .  Hyperlipidemia   . PONV (postoperative nausea and vomiting)   . Hemorrhoids     Past Surgical History  Procedure Laterality Date  . Dilation and curettage of uterus    . Cholecystectomy    . Laparoscopic vaginal hysterectomy    . Rectocele repair    . Breast lumpectomy Right     benign  . Total knee arthroplasty Right   . Foot surgery Left   . Abdominal hysterectomy    . Cataract extraction Bilateral 2006    Implants in both, surgeries done 6 weeks apart  . Esophagogastroduodenoscopy  03/08/02    [name]: esophageal stricture, chronic gerd, s/p dilation.   . Colonoscopy  03/08/02    [name]: diverticulosis, internal hemorrhoids, adenomatous colon polyp  . Esophagogastroduodenoscopy  01/23/04    [name]: esophageal stricture, gastritis, hiatal hernia  . Colonoscopy  01/23/04    Patterson: diverticulosis, internal hemorrhoids  . Colonoscopy  09/25/05    [name]: diverticulosis  . Esophagogastroduodenoscopy  09/25/05    Patterson: gastritis, benign bx, no H.pylori  . Esophagogastroduodenoscopy  07/05/09    Patterson: gastropathy, benign small bowel bx and gastric bx  . Colonoscopy  07/05/09    [name]: severe diverticulosis  . Colonoscopy  01/21/11    [name]: severe diverticulosis in sigmoid to desc colon, int hemorrhoids, follow up TCS in 5 years  . Esophageal manometry  03/21/08    Patterson: findings c/w Nutcracker Esophagus  . Esophagogastroduodenoscopy N/A 05/15/2015    Dr. Gala Romney- diffuse moderate inflammation characterized by congestion (edema), erythema, and linear erosions was  found in the entire examined stomach. bx= reactive gastropathy  . Hemorrhoid banding  2017    Dr.[name]    Family History  Problem Relation Age of Onset  . Ovarian cancer Mother   . Breast cancer Sister   . Liver cancer Sister   . Colon cancer Cousin     Paternal side  . Diabetes Maternal Aunt   . Diabetes Father   . Heart disease Father   . Liver disease Cousin     Maternal side, never  drank    Social History [name] reports that she has never smoked. She has never used smokeless tobacco. [name] reports that she does not drink alcohol.  Review of Systems CONSTITUTIONAL: No weight loss, fever, chills, weakness or fatigue.  HEENT: Eyes: No visual loss, blurred vision, double vision or yellow sclerae. No hearing loss, sneezing, congestion, runny nose or sore throat.  SKIN: No rash or itching.  CARDIOVASCULAR: per HPI RESPIRATORY: No shortness of breath, cough or sputum.  GASTROINTESTINAL:per HPI GENITOURINARY: no polyuria, no dysuria NEUROLOGICAL: No headache, dizziness, syncope, paralysis, ataxia, numbness or tingling in the extremities. No change in bowel or bladder control.  MUSCULOSKELETAL: No muscle, back pain, joint pain or stiffness.  HEMATOLOGIC: No anemia, bleeding or bruising.  LYMPHATICS: No enlarged nodes. No history of splenectomy.  PSYCHIATRIC: No history of depression or anxiety.      Physical Examination Blood pressure 140/66, pulse 72, temperature 98.1 F (36.7 C), temperature source Oral, resp. rate 20, height 5\' 4"  (1.626 m), weight 168 lb (76.204 kg), SpO2 98 %. No intake or output data in the 24 hours ending 08/16/15 1206  HEENT: sclera clear, throat clear  Cardiovascular: RRR, no m/r/g, no jvd  Respiratory: CTAB  GI: abdomen soft, NT, ND  MSK: no LE edema  Neuro: no focal deficits  Psych: appropriate affect   Lab Results  Basic Metabolic Panel: No results for input(s): NA, K, CL, CO2, GLUCOSE, BUN, CREATININE, CALCIUM, MG, PHOS in the last 168 hours.  Liver Function Tests: No results for input(s): AST, ALT, ALKPHOS, BILITOT, PROT, ALBUMIN in the last 168 hours.  CBC: No results for input(s): WBC, NEUTROABS, HGB, HCT, MCV, PLT in the last 168 hours.  Cardiac Enzymes: No results for input(s): CKTOTAL, CKMB, CKMBINDEX, TROPONINI in the last 168 hours.  BNP: Invalid input(s):  POCBNP      Impression/Recommendations 1. Chest pain - fairly convoluvted symptoms given her long chronic history of GI/epigastric pain. Current symptoms atypical, primarily a funny feeling in neck into shoulders and arms with exertion. Can have some SOB and diaphoresis. She has long history of GI and atypical chest pain with negative stress tests in 2006 and 2015, along with normal echo in 2015 - recommend admit to [facility] with cycling of cardiac enzymes and EKGs, keep npo at midnight in case stress testing is required.  - ER EKGs and initial troponin negative    Carlyle Dolly, M.D.

## 2015-08-16 NOTE — ED Notes (Signed)
Pt sent over by PCP for exertional SOB and abnormal EKG. Pt reports ongoing central chest pressure since Thursday with SOB. States pain started to radiated to bilateral arms and neck on Saturday.

## 2015-08-16 NOTE — ED Provider Notes (Signed)
CSN: [id number]     Arrival date & time 08/16/15  1114 History  By signing my name below, I, Nicole Kindred, attest that this documentation has been prepared under the direction and in the presence of Ezequiel Essex, MD.   Electronically Signed: Nicole Kindred, ED Scribe. 08/16/2015. 11:35 AM   Chief Complaint  Patient presents with  . Chest Pain   The history is provided by the patient. No language interpreter was used.   HPI Comments: Deborah Gray is a 82 y.o. female with extensive PMHx including DM, essential HTN, heart murmur, and HLD who presents to the Emergency Department for evaluation following exertional shortness of breath and abnormal EKG by PCP today. Pt states that she has had intermittent central chest pressure ongoing for six days which radiates into her bilateral arms. She also complains of abdominal distension, cough, dyspnea on exertion, and diaphoresis presenting with her chest pain. She has mild abdominal pain at baseline. No other associated symptoms noted. Her chest pain and shortness of breath is worse with exertion. Pt's symptoms are improved with rest. Pt has received aspirin since arriving tot he ED. No other worsening or alleviating factors noted. Pt denies nausea, emesis, or any other pertinent symptoms.   Past Medical History  Diagnosis Date  . Type 2 diabetes mellitus ([place])   . Dysphagia, pharyngoesophageal phase   . Esophageal reflux   . Constipation   . Benign neoplasm of colon   . Internal hemorrhoids without mention of complication   . Diaphragmatic hernia without mention of obstruction or gangrene   . Stricture and stenosis of esophagus   . Unspecified gastritis and gastroduodenitis without mention of hemorrhage   . Diverticulosis of colon (without mention of hemorrhage)   . Essential hypertension   . Arthritis   . Heart murmur   . Hyperlipidemia   . PONV (postoperative nausea and vomiting)   . Hemorrhoids    Past Surgical History   Procedure Laterality Date  . Dilation and curettage of uterus    . Cholecystectomy    . Laparoscopic vaginal hysterectomy    . Rectocele repair    . Breast lumpectomy Right     benign  . Total knee arthroplasty Right   . Foot surgery Left   . Abdominal hysterectomy    . Cataract extraction Bilateral 2006    Implants in both, surgeries done 6 weeks apart  . Esophagogastroduodenoscopy  03/08/02    [name]: esophageal stricture, chronic gerd, s/p dilation.   . Colonoscopy  03/08/02    [name]: diverticulosis, internal hemorrhoids, adenomatous colon polyp  . Esophagogastroduodenoscopy  01/23/04    [name]: esophageal stricture, gastritis, hiatal hernia  . Colonoscopy  01/23/04    Patterson: diverticulosis, internal hemorrhoids  . Colonoscopy  09/25/05    [name]: diverticulosis  . Esophagogastroduodenoscopy  09/25/05    Patterson: gastritis, benign bx, no H.pylori  . Esophagogastroduodenoscopy  07/05/09    Patterson: gastropathy, benign small bowel bx and gastric bx  . Colonoscopy  07/05/09    [name]: severe diverticulosis  . Colonoscopy  01/21/11    [name]: severe diverticulosis in sigmoid to desc colon, int hemorrhoids, follow up TCS in 5 years  . Esophageal manometry  03/21/08    Patterson: findings c/w Nutcracker Esophagus  . Esophagogastroduodenoscopy N/A 05/15/2015    Dr. Gala Romney- diffuse moderate inflammation characterized by congestion (edema), erythema, and linear erosions was found in the entire examined stomach. bx= reactive gastropathy  . Hemorrhoid banding  2017    Dr.[name]  Family History  Problem Relation Age of Onset  . Ovarian cancer Mother   . Breast cancer Sister   . Liver cancer Sister   . Colon cancer Cousin     Paternal side  . Diabetes Maternal Aunt   . Diabetes Father   . Heart disease Father   . Liver disease Cousin     Maternal side, never drank   Social History  Substance Use Topics  . Smoking status: Never Smoker   . Smokeless tobacco:  Never Used     Comment: Never smoked  . Alcohol Use: No   OB History    No data available     Review of Systems A complete 10 system review of systems was obtained and all systems are negative except as noted in the HPI and PMH.    Allergies  Estrogens; Other; and Sulfonamide derivatives  Home Medications   Prior to Admission medications   Medication Sig Start Date End Date Taking? Authorizing Provider  aspirin 81 MG tablet Take 81 mg by mouth at bedtime.      Historical Provider, MD  azelastine (ASTELIN) 0.1 % nasal spray Place 1 spray into both nostrils daily. Use in each nostril as directed    Historical Provider, MD  clidinium-chlordiazePOXIDE (LIBRAX) 5-2.5 MG capsule Take 1 tab every 6-8 hours as needed for abdominal spasms. 04/11/15   Amy S Esterwood, PA-C  ezetimibe (ZETIA) 10 MG tablet TAKE 1 TABLET (10 MG TOTAL) BY MOUTH DAILY. 08/02/15   [name], MD  fexofenadine (ALLEGRA) 180 MG tablet Take 180 mg by mouth daily.    Historical Provider, MD  hyoscyamine (LEVSIN/SL) 0.125 MG SL tablet Place 1 tablet (0.125 mg total) under the tongue every 4 (four) hours as needed. 01/30/15   Mahala Menghini, PA-C  imipramine (TOFRANIL) 10 MG tablet Take 3 tablets (30 mg total) by mouth at bedtime. 06/27/15   Mahala Menghini, PA-C  Ketotifen Fumarate (THERA TEARS ALLERGY OP) Apply 1 drop to eye 2 (two) times daily.     Historical Provider, MD  meclizine (ANTIVERT) 25 MG tablet TAKE 1 TABLET (25 MG TOTAL) BY MOUTH 3 (THREE) TIMES DAILY AS NEEDED FOR DIZZINESS OR NAUSEA. Patient not taking: Reported on 06/06/2015 05/16/15   [name], MD  metFORMIN (GLUCOPHAGE) 500 MG tablet TAKE 1 TABLET (500 MG TOTAL) BY MOUTH 2 (TWO) TIMES DAILY. 05/19/15   [name], MD  mirabegron ER (MYRBETRIQ) 25 MG TB24 tablet Take 25 mg by mouth daily.    Historical Provider, MD  Multiple Vitamins-Minerals (PRESERVISION AREDS 2) CAPS Take 1 capsule by mouth 2 (two) times daily. Reported on 06/06/2015     Historical Provider, MD  nateglinide (STARLIX) 120 MG tablet TAKE 1 TABLET BY MOUTH 3 TIMES A DAY BEFORE MEALS 05/19/15   [name], MD  NON Erick Blinks Probiotic  Once daily    Historical Provider, MD  Omega-3 Fatty Acids (FISH OIL) 1200 MG CAPS Take 4 capsules by mouth daily. Take 4 capsules daily.    Historical Provider, MD  omeprazole (PRILOSEC) 20 MG capsule Take 1 capsule (20 mg total) by mouth daily before breakfast. 04/27/15   Mahala Menghini, PA-C  Pancrelipase, Lip-Prot-Amyl, (CREON) 24000 units CPEP Take one capsule 3 times a day with meals and one capsule with snacks. 04/18/15   Mahala Menghini, PA-C  phenazopyridine (PYRIDIUM) 97 MG tablet Take 1 tablet (97 mg total) by mouth 3 (three) times daily as needed for pain.  03/20/15   [name], MD  Probiotic Product (ALIGN) 4 MG CAPS Take 1 capsule by mouth daily. Reported on 06/06/2015 01/18/11   Sable Feil, MD  valsartan (DIOVAN) 80 MG tablet TAKE 1 TABLET (80 MG TOTAL) BY MOUTH DAILY. 06/08/14   [name], MD  VITAMIN E PO Take 1 capsule by mouth. Four times per week    Historical Provider, MD  Vitamin Mixture (ESTER-C) 500-60 MG TABS Take by mouth. 1 tab three times weekly      Historical Provider, MD  Wheat Dextrin (BENEFIBER PO) Take by mouth.    Historical Provider, MD   BP 140/66 mmHg  Pulse 72  Temp(Src) 98.1 F (36.7 C) (Oral)  Resp 20  Ht 5\' 4"  (1.626 m)  Wt 168 lb (76.204 kg)  BMI 28.82 kg/m2  SpO2 98% Physical Exam  Constitutional: She is oriented to person, place, and time. She appears well-developed and well-nourished. No distress.  HENT:  Head: Normocephalic and atraumatic.  Mouth/Throat: Oropharynx is clear and moist. No oropharyngeal exudate.  Eyes: Conjunctivae and EOM are normal. Pupils are equal, round, and reactive to light.  Neck: Normal range of motion. Neck supple.  No meningismus.  Cardiovascular: Normal rate, regular rhythm, normal heart sounds and intact distal pulses.   No  murmur heard. Pulmonary/Chest: Effort normal and breath sounds normal. No respiratory distress.  Abdominal: Soft. There is no tenderness. There is no rebound and no guarding.  Epigastric and RUQ abdominal TTP.  Musculoskeletal: Normal range of motion. She exhibits no edema or tenderness.  Neurological: She is alert and oriented to person, place, and time. No cranial nerve deficit. She exhibits normal muscle tone. Coordination normal.  No ataxia on finger to nose bilaterally. No pronator drift. 5/5 strength throughout. CN 2-12 intact.Equal grip strength. Sensation intact.   Skin: Skin is warm.  Psychiatric: She has a normal mood and affect. Her behavior is normal.  Nursing note and vitals reviewed.   ED Course  Procedures (including critical care time) DIAGNOSTIC STUDIES: Oxygen Saturation is 98% on RA, normal by my interpretation.    COORDINATION OF CARE: 11:52 AM Discussed treatment plan which includes EKG, CXR, lipase, hepatic function panel, CBC, BMP, and troponin I with pt at bedside and pt agreed to plan.  Labs Review Labs Reviewed  BASIC METABOLIC PANEL - Abnormal; Notable for the following:    Glucose, Bld 116 (*)    BUN 23 (*)    All other components within normal limits  HEPATIC FUNCTION PANEL - Abnormal; Notable for the following:    Bilirubin, Direct <0.1 (*)    All other components within normal limits  CBC  TROPONIN I  LIPASE, BLOOD  URINALYSIS, ROUTINE W REFLEX MICROSCOPIC (NOT AT [facility])  CBC  CREATININE, SERUM  TROPONIN I  TROPONIN I  TROPONIN I   Imaging Review Dg Chest 2 View  08/16/2015  CLINICAL DATA:  Chest pressure, shortness of breath since yesterday. EXAM: CHEST  2 VIEW COMPARISON:  11/02/2014 FINDINGS: Heart and mediastinal contours are within normal limits. No focal opacities or effusions. No acute bony abnormality. IMPRESSION: No active cardiopulmonary disease. Electronically Signed   By: Rolm Baptise M.D.   On: 08/16/2015 11:51   I have personally  reviewed and evaluated these images and lab results as part of my medical decision-making.   EKG Interpretation   Date/Time:  Wednesday August 16 2015 11:24:37 EDT Ventricular Rate:  69 PR Interval:    QRS Duration: 82 QT Interval:  366 QTC Calculation: 392 R Axis:   -5 Text Interpretation:  Sinus rhythm Borderline prolonged PR interval  Consider left atrial enlargement T wave inversion III Confirmed by Wyvonnia Dusky   MD, Flynt Breeze [phone]) on 08/16/2015 11:54:28 AM      MDM   Final diagnoses:  Exertional chest pain  Patient with long history of epigastric and upper abdominal pain presenting from PCPs office with exertional chest pressure, shortness of breath radiating to her shoulders with diaphoresis. No chest pressure at rest. EKG PCPs office that shows an ST elevation inferiorly with Q waves.This is similar to 2016.  EKG on arrival to the ED shows no ST elevation.  COncern for angina.  She is pain-free on arrival. LFTs and lipase are normal. Troponin is negative. Heart score is 6. Discussed with Dr. Harl Bowie of cardiology who recommended medical admission to [facility] for rule out.  Admission d/w Dr. Roderic Palau.  I personally performed the services described in this documentation, which was scribed in my presence. The recorded information has been reviewed and is accurate.   Ezequiel Essex, MD 08/16/15 [phone]

## 2015-08-16 NOTE — H&P (Signed)
History and Physical    Deborah Gray [id number] DOB: November 06, 1933 DOA: 08/16/2015  Referring MD/NP/PA: Ezequiel Essex, MD PCP: Mickie Hillier, MD  Outpatient Specialists:   Gastroenterology; Dr. Gala Romney  Patient coming from: home  Chief Complaint: Chest pain  HPI: Deborah Gray is a 82 y.o. female with medical history significant of DM type 2, GERD, HTN, HLD, presents from her PCP's office with complaints of intermittent, exertional chest pressure onset 6 days ago, which radiates to her arms bilaterally. Her symptoms are aggravated with exertion, and improved with rest. She reports associated shortness of breath on exertion, abd distention, cough, and diaphoresis. She has mild chronic abdominal pain. She denies any nausea, emesis, or other pertinent symptoms.   ED Course: While in the ED, workup showed normal LFTs and lipase. Her troponin was negative. She was started on aspirin, and the EDP discussed case with Dr. Harl Bowie of cardiology, who recommended patient be admitted for chest pain rule out.   Review of Systems: As per HPI otherwise 10 point review of systems negative.   Past Medical History  Diagnosis Date  . Type 2 diabetes mellitus ([place])   . Dysphagia, pharyngoesophageal phase   . Esophageal reflux   . Constipation   . Benign neoplasm of colon   . Internal hemorrhoids without mention of complication   . Diaphragmatic hernia without mention of obstruction or gangrene   . Stricture and stenosis of esophagus   . Unspecified gastritis and gastroduodenitis without mention of hemorrhage   . Diverticulosis of colon (without mention of hemorrhage)   . Essential hypertension   . Arthritis   . Heart murmur   . Hyperlipidemia   . PONV (postoperative nausea and vomiting)   . Hemorrhoids     Past Surgical History  Procedure Laterality Date  . Dilation and curettage of uterus    . Cholecystectomy    . Laparoscopic vaginal hysterectomy    . Rectocele repair    . Breast  lumpectomy Right     benign  . Total knee arthroplasty Right   . Foot surgery Left   . Abdominal hysterectomy    . Cataract extraction Bilateral 2006    Implants in both, surgeries done 6 weeks apart  . Esophagogastroduodenoscopy  03/08/02    [name]: esophageal stricture, chronic gerd, s/p dilation.   . Colonoscopy  03/08/02    [name]: diverticulosis, internal hemorrhoids, adenomatous colon polyp  . Esophagogastroduodenoscopy  01/23/04    [name]: esophageal stricture, gastritis, hiatal hernia  . Colonoscopy  01/23/04    Patterson: diverticulosis, internal hemorrhoids  . Colonoscopy  09/25/05    [name]: diverticulosis  . Esophagogastroduodenoscopy  09/25/05    Patterson: gastritis, benign bx, no H.pylori  . Esophagogastroduodenoscopy  07/05/09    Patterson: gastropathy, benign small bowel bx and gastric bx  . Colonoscopy  07/05/09    [name]: severe diverticulosis  . Colonoscopy  01/21/11    [name]: severe diverticulosis in sigmoid to desc colon, int hemorrhoids, follow up TCS in 5 years  . Esophageal manometry  03/21/08    Patterson: findings c/w [place] Esophagus  . Esophagogastroduodenoscopy N/A 05/15/2015    Dr. Gala Romney- diffuse moderate inflammation characterized by congestion (edema), erythema, and linear erosions was found in the entire examined stomach. bx= reactive gastropathy  . Hemorrhoid banding  2017    Dr.[name]     reports that she has never smoked. She has never used smokeless tobacco. She reports that she does not drink alcohol or use illicit drugs.  Allergies  Allergen Reactions  . Estrogens Other (See Comments)    Migraines   . Other     Adhesive tape, blisters skin  . Sulfonamide Derivatives Rash    Family History  Problem Relation Age of Onset  . Ovarian cancer Mother   . Breast cancer Sister   . Liver cancer Sister   . Colon cancer Cousin     Paternal side  . Diabetes Maternal Aunt   . Diabetes Father   . Heart disease Father   . Liver  disease Cousin     Maternal side, never drank   Prior to Admission medications   Medication Sig Start Date End Date Taking? Authorizing Provider  aspirin 81 MG tablet Take 81 mg by mouth at bedtime.     Yes Historical Provider, MD  azelastine (ASTELIN) 0.1 % nasal spray Place 1 spray into both nostrils daily. Use in each nostril as directed   Yes Historical Provider, MD  clidinium-chlordiazePOXIDE (LIBRAX) 5-2.5 MG capsule Take 1 tab every 6-8 hours as needed for abdominal spasms. 04/11/15  Yes Amy S Esterwood, PA-C  ezetimibe (ZETIA) 10 MG tablet TAKE 1 TABLET (10 MG TOTAL) BY MOUTH DAILY. 08/02/15  Yes [name], MD  fexofenadine (ALLEGRA) 180 MG tablet Take 180 mg by mouth daily.   Yes Historical Provider, MD  hyoscyamine (LEVSIN/SL) 0.125 MG SL tablet Place 1 tablet (0.125 mg total) under the tongue every 4 (four) hours as needed. Patient taking differently: Place 0.125 mg under the tongue every 4 (four) hours as needed for cramping.  01/30/15  Yes Mahala Menghini, PA-C  imipramine (TOFRANIL) 10 MG tablet Take 3 tablets (30 mg total) by mouth at bedtime. Patient taking differently: Take 10-20 mg by mouth at bedtime.  06/27/15  Yes Mahala Menghini, PA-C  metFORMIN (GLUCOPHAGE) 500 MG tablet TAKE 1 TABLET (500 MG TOTAL) BY MOUTH 2 (TWO) TIMES DAILY. 05/19/15  Yes [name], MD  Multiple Vitamins-Minerals (PRESERVISION AREDS 2) CAPS Take 1 capsule by mouth 2 (two) times daily. Reported on 06/06/2015   Yes Historical Provider, MD  nateglinide (STARLIX) 120 MG tablet TAKE 1 TABLET BY MOUTH 3 TIMES A DAY BEFORE MEALS 05/19/15  Yes [name], MD  nitrofurantoin, macrocrystal-monohydrate, (MACROBID) 100 MG capsule Take 100 mg by mouth at bedtime.  06/15/15  Yes Historical Provider, MD  Omega-3 Fatty Acids (FISH OIL) 1200 MG CAPS Take 4 capsules by mouth daily. Take 4 capsules daily.   Yes Historical Provider, MD  Pancrelipase, Lip-Prot-Amyl, (CREON) 24000 units CPEP Take one capsule 3 times  a day with meals and one capsule with snacks. 04/18/15  Yes Mahala Menghini, PA-C  phenazopyridine (PYRIDIUM) 100 MG tablet Take 1 tablet by mouth daily as needed for pain.  08/02/15  Yes Historical Provider, MD  phenazopyridine (PYRIDIUM) 97 MG tablet Take 1 tablet (97 mg total) by mouth 3 (three) times daily as needed for pain. 03/20/15  Yes [name], MD  Probiotic Product (ALIGN) 4 MG CAPS Take 1 capsule by mouth daily. Reported on 06/06/2015 01/18/11  Yes Sable Feil, MD  trimethoprim (TRIMPEX) 100 MG tablet Take 100 mg by mouth at bedtime. 08/02/15  Yes Historical Provider, MD  valsartan (DIOVAN) 80 MG tablet TAKE 1 TABLET (80 MG TOTAL) BY MOUTH DAILY. 06/08/14  Yes [name], MD  VITAMIN E PO Take 1 capsule by mouth 4 (four) times a week. Four times per week   Yes Historical Provider, MD  Vitamin Mixture (  ESTER-C) 500-60 MG TABS Take 1 tablet by mouth 4 (four) times a week. 1 tab three times weekly    Yes Historical Provider, MD  Wheat Dextrin (BENEFIBER PO) Take by mouth.   Yes Historical Provider, MD  Ketotifen Fumarate (THERA TEARS ALLERGY OP) Apply 1 drop to eye 2 (two) times daily.     Historical Provider, MD  meclizine (ANTIVERT) 25 MG tablet TAKE 1 TABLET (25 MG TOTAL) BY MOUTH 3 (THREE) TIMES DAILY AS NEEDED FOR DIZZINESS OR NAUSEA. Patient not taking: Reported on 06/06/2015 05/16/15   [name], MD  mirabegron ER (MYRBETRIQ) 25 MG TB24 tablet Take 25 mg by mouth daily.    Historical Provider, MD  omeprazole (PRILOSEC) 20 MG capsule Take 1 capsule (20 mg total) by mouth daily before breakfast. 04/27/15   Mahala Menghini, PA-C   Physical Exam: Filed Vitals:   08/16/15 1126 08/16/15 1200 08/16/15 1230 08/16/15 1304  BP: 140/66 131/71 135/66 137/62  Pulse: 72 70 73 70  Temp: 98.1 F (36.7 C)     TempSrc: Oral     Resp: 20 16 19 18   Height: 5\' 4"  (1.626 m)     Weight: 76.204 kg (168 lb)     SpO2: 98% 96% 98% 98%   Constitutional: NAD, calm, comfortable  Filed  Vitals:   08/16/15 1126 08/16/15 1200 08/16/15 1230 08/16/15 1304  BP: 140/66 131/71 135/66 137/62  Pulse: 72 70 73 70  Temp: 98.1 F (36.7 C)     TempSrc: Oral     Resp: 20 16 19 18   Height: 5\' 4"  (1.626 m)     Weight: 76.204 kg (168 lb)     SpO2: 98% 96% 98% 98%   Eyes: PERRL, lids and conjunctivae normal ENMT: Mucous membranes are moist. Posterior pharynx clear of any exudate or lesions.Normal dentition.  Neck: normal, supple, no masses, no thyromegaly Respiratory: clear to auscultation bilaterally, no wheezing, no crackles. Normal respiratory effort. No accessory muscle use.  Cardiovascular: Regular rate and rhythm, no murmurs / rubs / gallops. No extremity edema. 2+ pedal pulses. No carotid bruits.  Abdomen: no tenderness, no masses palpated. No hepatosplenomegaly. Bowel sounds positive.  Musculoskeletal: no clubbing / cyanosis. No joint deformity upper and lower extremities. Good ROM, no contractures. Normal muscle tone.  Skin: no rashes, lesions, ulcers. No induration Neurologic: CN 2-12 grossly intact. Sensation intact, DTR normal. Strength 5/5 in all 4.  Psychiatric: Normal judgment and insight. Alert and oriented x 3. Normal mood.   Labs on Admission: I have personally reviewed following labs and imaging studies  CBC:  Recent Labs Lab 08/16/15 1137  WBC 9.3  HGB 13.6  HCT 40.9  MCV 90.7  PLT 99991111   Basic Metabolic Panel:  Recent Labs Lab 08/16/15 1137  NA 137  K 4.3  CL 105  CO2 25  GLUCOSE 116*  BUN 23*  CREATININE 0.66  CALCIUM 9.3   GFR: Estimated Creatinine Clearance: 57 mL/min (by C-G formula based on Cr of 0.66). Liver Function Tests:  Recent Labs Lab 08/16/15 1137  AST 16  ALT 18  ALKPHOS 62  BILITOT 0.4  PROT 7.3  ALBUMIN 4.2    Recent Labs Lab 08/16/15 1137  LIPASE 13   Cardiac Enzymes:  Recent Labs Lab 08/16/15 1137  TROPONINI <0.03   Urine analysis:    Component Value Date/Time   COLORURINE YELLOW 08/16/2015 [place] 08/16/2015 1253   LABSPEC 1.020 08/16/2015 1253   PHURINE 6.0  08/16/2015 [place] 08/16/2015 1253   HGBUR NEGATIVE 08/16/2015 1253   BILIRUBINUR NEGATIVE 08/16/2015 1253   KETONESUR NEGATIVE 08/16/2015 1253   PROTEINUR NEGATIVE 08/16/2015 1253   UROBILINOGEN 0.2 04/08/2008 1454   NITRITE NEGATIVE 08/16/2015 1253   LEUKOCYTESUR NEGATIVE 08/16/2015 1253   Sepsis Labs: @LABRCNTIP (procalcitonin:4,lacticidven:4) )No results found for this or any previous visit (from the past 240 hour(s)).   Radiological Exams on Admission: Dg Chest 2 View  08/16/2015  CLINICAL DATA:  Chest pressure, shortness of breath since yesterday. EXAM: CHEST  2 VIEW COMPARISON:  11/02/2014 FINDINGS: Heart and mediastinal contours are within normal limits. No focal opacities or effusions. No acute bony abnormality. IMPRESSION: No active cardiopulmonary disease. Electronically Signed   By: Rolm Baptise M.D.   On: 08/16/2015 11:51    EKG: Independently reviewed. Sinus rhythm without acute changes.  Assessment/Plan Active Problems:   Exertional chest pain  1. Chest pain. Patient's history has elements that are concerning for angina. CXR negative. Initial troponin negative. Cardiology has been consulted and recommends admit for observation, ruling out ACS with cardiac markers and possible stress test in AM. Patient will be kept NPO after midnight. 2. DM type 2. Blood sugars stable. Will start the patient on SSI. 3. GERD. Continue PPI. GI cocktail PRN 4. Chronic pancreatitis, continue pancreatic enzymes supplement 5. Essential HTN. Continue antihypertensives. 6. HLD. Continue statins 7. IBS. Stable.   DVT prophylaxis: lovenox Code Status: full Family Communication: discussed with husband at the bedside Disposition Plan: Discharge home once improved. Consults called: Cardiology Admission status: Admit as observation. telemetry   Kathie Dike, MD [facility] Pager [phone]  If 7PM-7AM, please contact night-coverage [email or web address] Password [email or web address] 08/16/2015, 1:28 PM

## 2015-08-17 ENCOUNTER — Encounter (HOSPITAL_COMMUNITY): Payer: Medicare Other

## 2015-08-17 ENCOUNTER — Encounter (HOSPITAL_BASED_OUTPATIENT_CLINIC_OR_DEPARTMENT_OTHER): Payer: Medicare Other

## 2015-08-17 ENCOUNTER — Encounter (HOSPITAL_COMMUNITY): Payer: Self-pay

## 2015-08-17 DIAGNOSIS — K219 Gastro-esophageal reflux disease without esophagitis: Secondary | ICD-10-CM | POA: Diagnosis not present

## 2015-08-17 DIAGNOSIS — R072 Precordial pain: Secondary | ICD-10-CM | POA: Diagnosis not present

## 2015-08-17 DIAGNOSIS — I10 Essential (primary) hypertension: Secondary | ICD-10-CM | POA: Diagnosis not present

## 2015-08-17 DIAGNOSIS — R079 Chest pain, unspecified: Secondary | ICD-10-CM

## 2015-08-17 DIAGNOSIS — R0789 Other chest pain: Secondary | ICD-10-CM | POA: Diagnosis not present

## 2015-08-17 DIAGNOSIS — K861 Other chronic pancreatitis: Secondary | ICD-10-CM | POA: Diagnosis not present

## 2015-08-17 LAB — GLUCOSE, CAPILLARY
Glucose-Capillary: 141 mg/dL — ABNORMAL HIGH (ref 65–99)
Glucose-Capillary: 114 mg/dL — ABNORMAL HIGH (ref 65–99)
Glucose-Capillary: 170 mg/dL — ABNORMAL HIGH (ref 65–99)

## 2015-08-17 LAB — NM MYOCAR MULTI W/SPECT W/WALL MOTION / EF
CHL CUP NUCLEAR SRS: 1
CHL CUP NUCLEAR SSS: 1
LHR: 0.26
LV dias vol: 60 mL (ref 46–106)
LVSYSVOL: 15 mL
Peak HR: 97 {beats}/min
Rest HR: 73 {beats}/min
SDS: 0
TID: 1.13

## 2015-08-17 LAB — TROPONIN I: Troponin I: <0.03 ng/mL (ref <0.03)

## 2015-08-17 MED ORDER — SODIUM CHLORIDE 0.9% FLUSH
INTRAVENOUS | Status: AC
  Administered 2015-08-17: 10 mL via INTRAVENOUS
  Filled 2015-08-17: qty 10

## 2015-08-17 MED ORDER — TECHNETIUM TC 99M TETROFOSMIN IV KIT
30.0000 | PACK | Freq: Once | INTRAVENOUS | Status: AC | PRN
  Administered 2015-08-17: 30 via INTRAVENOUS

## 2015-08-17 MED ORDER — REGADENOSON 0.4 MG/5ML IV SOLN
INTRAVENOUS | Status: AC
  Administered 2015-08-17: 0.4 mg via INTRAVENOUS
  Filled 2015-08-17: qty 5

## 2015-08-17 MED ORDER — TECHNETIUM TC 99M TETROFOSMIN IV KIT
10.0000 | PACK | Freq: Once | INTRAVENOUS | Status: AC | PRN
  Administered 2015-08-17: 10 via INTRAVENOUS

## 2015-08-17 MED ORDER — IMIPRAMINE HCL 10 MG PO TABS
20.0000 mg | ORAL_TABLET | Freq: Every day | ORAL | Status: DC
  Filled 2015-08-17: qty 2

## 2015-08-17 NOTE — Care Management Obs Status (Signed)
[place] NOTIFICATION   Patient Details  Name: Deborah Gray MRN: [id number] Date of Birth: Feb 05, 1934   Medicare Observation Status Notification Given:  Yes    Wylma Tatem, Chauncey Reading, RN 08/17/2015, 3:15 PM

## 2015-08-17 NOTE — Care Management Note (Signed)
Case Management Note  Patient Details  Name: Deborah Gray MRN: [id number] Date of Birth: 03-16-1933  Subjective/Objective:  Patient admitted from home with exertional chest pain. Patient is independent with ADL's. Her PCP is Dr. Wolfgang Phoenix. She reports no issues with obtaining medications.                  Action/Plan:  No CM needs.    Expected Discharge Date:       08/17/2015           Expected Discharge Plan:  Home/Self Care  In-House Referral:  NA  Discharge planning Services  CM Consult  Post Acute Care Choice:  NA Choice offered to:  NA  DME Arranged:    DME Agency:     HH Arranged:    HH Agency:     Status of Service:  Completed, signed off  If discussed at [employer] of Stay Meetings, dates discussed:    Additional Comments:  Ahmed Inniss, Chauncey Reading, RN 08/17/2015, 3:12 PM

## 2015-08-17 NOTE — Discharge Summary (Signed)
Physician Discharge Summary  Deborah Gray [id number] DOB: [birth year] DOA: 08/16/2015  PCP: Mickie Hillier, MD  Admit date: 08/16/2015 Discharge date: 08/17/2015  Time spent: 35 minutes  Recommendations for Outpatient Follow-up:  1. Follow-up with PCP in 1-2 weeks.    Discharge Diagnoses:  Active Problems:   GERD (gastroesophageal reflux disease)   Chronic pancreatitis ([facility])   Essential hypertension   Type 2 diabetes mellitus ([facility])   Exertional chest pain   Chest pain   Discharge Condition: Improved  Diet recommendation: Heart healthy, diabetic diet   Filed Weights   08/16/15 1126 08/16/15 1542  Weight: 76.204 kg (168 lb) 73.664 kg (162 lb 6.4 oz)    History of present illness:  82 y.o. female with medical history significant of DM type 2, GERD, HTN, HLD, presents from her PCP's office with complaints of intermittent, exertional chest pressure onset 6 days PTA, which radiates to her arms bilaterally. While in the ED, workup showed normal LFTs and lipase. Initial troponin negative. She was started on aspirin, and the EDP discussed case with Dr. Harl Bowie of cardiology, who recommended patient be admitted for chest pain rule out.   Hospital Course:  Patient was admitted for exertional chest pressure that radiates to her bilateral arms. Due to patient's history there were concerns for angina. CXR negative. She ruled out for ACS with negative cardiac markers. Cardiology consulted and recommended that patient undergo stress testing. Stress test was found to be a normal study. She did not have any further discomfort in the hospital. She is felt stable to discharge home and follow up with her primary care doctor for further work up.   DM type 2. Blood sugars stable. SSI.  GERD. Continue PPI.   Chronic pancreatitis, continue pancreatic enzymes supplement  Essential HTN. Continue antihypertensives.  HLD. Continue statins  IBS.  Stable.  Procedures:    Consultations:  Cardiology   Discharge Exam: Filed Vitals:   08/17/15 0550 08/17/15 1300  BP: 122/59 131/54  Pulse: 81 76  Temp: 97.8 F (36.6 C) 98 F (36.7 C)  Resp: 20 20     General: NAD, looks comfortable  Cardiovascular: RRR, S1, S2   Respiratory: clear bilaterally, No wheezing, rales or rhonchi  Abdomen: soft, non tender, no distention , bowel sounds normal  Musculoskeletal: No edema b/l   Discharge Instructions   Discharge Instructions    Diet - low sodium heart healthy    Complete by:  As directed      Increase activity slowly    Complete by:  As directed           Current Discharge Medication List    CONTINUE these medications which have NOT CHANGED   Details  aspirin 81 MG tablet Take 81 mg by mouth at bedtime.      azelastine (ASTELIN) 0.1 % nasal spray Place 1 spray into both nostrils daily. Use in each nostril as directed    clidinium-chlordiazePOXIDE (LIBRAX) 5-2.5 MG capsule Take 1 tab every 6-8 hours as needed for abdominal spasms. Qty: 90 capsule, Refills: 1    ezetimibe (ZETIA) 10 MG tablet TAKE 1 TABLET (10 MG TOTAL) BY MOUTH DAILY. Qty: 90 tablet, Refills: 2    fexofenadine (ALLEGRA) 180 MG tablet Take 180 mg by mouth daily.    hyoscyamine (LEVSIN/SL) 0.125 MG SL tablet Place 1 tablet (0.125 mg total) under the tongue every 4 (four) hours as needed. Qty: 90 tablet, Refills: 1    imipramine (TOFRANIL) 10 MG tablet Take 3  tablets (30 mg total) by mouth at bedtime. Qty: 90 tablet, Refills: 5    Ketotifen Fumarate (THERA TEARS ALLERGY OP) Apply 1 drop to eye 2 (two) times daily.     meclizine (ANTIVERT) 25 MG tablet TAKE 1 TABLET (25 MG TOTAL) BY MOUTH 3 (THREE) TIMES DAILY AS NEEDED FOR DIZZINESS OR NAUSEA. Qty: 30 tablet, Refills: 1    metFORMIN (GLUCOPHAGE) 500 MG tablet TAKE 1 TABLET (500 MG TOTAL) BY MOUTH 2 (TWO) TIMES DAILY. Qty: 180 tablet, Refills: 1    mirabegron ER (MYRBETRIQ) 25 MG TB24  tablet Take 25 mg by mouth daily.    Multiple Vitamins-Minerals (PRESERVISION AREDS 2) CAPS Take 1 capsule by mouth 2 (two) times daily. Reported on 06/06/2015    nateglinide (STARLIX) 120 MG tablet TAKE 1 TABLET BY MOUTH 3 TIMES A DAY BEFORE MEALS Qty: 270 tablet, Refills: 1    nitrofurantoin, macrocrystal-monohydrate, (MACROBID) 100 MG capsule Take 100 mg by mouth at bedtime.  Refills: 1    Omega-3 Fatty Acids (FISH OIL) 1200 MG CAPS Take 4 capsules by mouth daily.     omeprazole (PRILOSEC) 20 MG capsule Take 1 capsule (20 mg total) by mouth daily before breakfast. Qty: 90 capsule, Refills: 3    Pancrelipase, Lip-Prot-Amyl, (CREON) 24000 units CPEP Take one capsule 3 times a day with meals and one capsule with snacks. Qty: 450 capsule, Refills: 3    phenazopyridine (PYRIDIUM) 100 MG tablet Take 1 tablet by mouth daily as needed for pain.  Refills: 3    Probiotic Product (ALIGN) 4 MG CAPS Take 1 capsule by mouth daily. Reported on 06/06/2015    trimethoprim (TRIMPEX) 100 MG tablet Take 100 mg by mouth at bedtime. Refills: 11    valsartan (DIOVAN) 80 MG tablet TAKE 1 TABLET (80 MG TOTAL) BY MOUTH DAILY. Qty: 90 tablet, Refills: 3    VITAMIN E PO Take 1 capsule by mouth 4 (four) times a week. Four times per week    Vitamin Mixture (ESTER-C) 500-60 MG TABS Take 1 tablet by mouth 4 (four) times a week. 1 tab three times weekly     Wheat Dextrin (BENEFIBER PO) Take 1 packet by mouth daily.        Allergies  Allergen Reactions  . Estrogens Other (See Comments)    Migraines   . Other     Adhesive tape, blisters skin  . Sulfonamide Derivatives Rash      The results of significant diagnostics from this hospitalization (including imaging, microbiology, ancillary and laboratory) are listed below for reference.    Significant Diagnostic Studies: Dg Chest 2 View  08/16/2015  CLINICAL DATA:  Chest pressure, shortness of breath since yesterday. EXAM: CHEST  2 VIEW COMPARISON:   11/02/2014 FINDINGS: Heart and mediastinal contours are within normal limits. No focal opacities or effusions. No acute bony abnormality. IMPRESSION: No active cardiopulmonary disease. Electronically Signed   By: Rolm Baptise M.D.   On: 08/16/2015 11:51   Nm Myocar Multi W/spect W/wall Motion / Ef  08/17/2015   There was no ST segment deviation noted during stress.  No T wave inversion was noted during stress.  The study is normal.  This is a low risk study.  The left ventricular ejection fraction is hyperdynamic (>65%).     Microbiology: No results found for this or any previous visit (from the past 240 hour(s)).   Labs: Basic Metabolic Panel:  Recent Labs Lab 08/16/15 1137  NA 137  K 4.3  CL 105  CO2 25  GLUCOSE 116*  BUN 23*  CREATININE 0.66  CALCIUM 9.3   Liver Function Tests:  Recent Labs Lab 08/16/15 1137  AST 16  ALT 18  ALKPHOS 62  BILITOT 0.4  PROT 7.3  ALBUMIN 4.2    Recent Labs Lab 08/16/15 1137  LIPASE 13   No results for input(s): AMMONIA in the last 168 hours. CBC:  Recent Labs Lab 08/16/15 1137  WBC 9.3  HGB 13.6  HCT 40.9  MCV 90.7  PLT 238   Cardiac Enzymes:  Recent Labs Lab 08/16/15 1137 08/16/15 1851 08/16/15 2100 08/16/15 2323  TROPONINI <0.03 <0.03 <0.03 <0.03   CBG:  Recent Labs Lab 08/16/15 2122 08/17/15 0719 08/17/15 1120 08/17/15 1610  GLUCAP 133* 141* 114* 170*       Signed:  Kathie Dike, MD [facility] 08/17/2015, 4:26 PM

## 2015-08-17 NOTE — Progress Notes (Signed)
Patient with orders to be discharge. Discharge instructions given, patient verbalized understanding. Patient stable.

## 2015-08-17 NOTE — Progress Notes (Signed)
Subjective: No CP  No SOB   Objective: Filed Vitals:   08/16/15 1500 08/16/15 1542 08/16/15 2137 08/17/15 0550  BP: 143/85 150/52 133/55 122/59  Pulse:  83 82 81  Temp:  97.8 F (36.6 C) 98.3 F (36.8 C) 97.8 F (36.6 C)  TempSrc:  Oral Oral Oral  Resp: 22 20 20 20   Height:  5\' 4"  (1.626 m)    Weight:  162 lb 6.4 oz (73.664 kg)    SpO2:  100% 98% 97%   Weight change:   Intake/Output Summary (Last 24 hours) at 08/17/15 0953 Last data filed at 08/16/15 1700  Gross per 24 hour  Intake    240 ml  Output      0 ml  Net    240 ml    General: Alert, awake, oriented x3, in no acute distress Neck:  JVP is normal Heart: Regular rate and rhythm, without murmurs, rubs, gallops.  Lungs: Clear to auscultation.  No rales or wheezes. Exemities:  No edema.   Neuro: Grossly intact, nonfocal.  Tele  SR   Lab Results: Results for orders placed or performed during the hospital encounter of 08/16/15 (from the past 24 hour(s))  Basic metabolic panel     Status: Abnormal   Collection Time: 08/16/15 11:37 AM  Result Value Ref Range   Sodium 137 135 - 145 mmol/L   Potassium 4.3 3.5 - 5.1 mmol/L   Chloride 105 101 - 111 mmol/L   CO2 25 22 - 32 mmol/L   Glucose, Bld 116 (H) 65 - 99 mg/dL   BUN 23 (H) 6 - 20 mg/dL   Creatinine, Ser 0.66 0.44 - 1.00 mg/dL   Calcium 9.3 8.9 - 10.3 mg/dL   GFR calc non Af Amer >60 >60 mL/min   GFR calc Af Amer >60 >60 mL/min   Anion gap 7 5 - 15  CBC     Status: None   Collection Time: 08/16/15 11:37 AM  Result Value Ref Range   WBC 9.3 4.0 - 10.5 K/uL   RBC 4.51 3.87 - 5.11 MIL/uL   Hemoglobin 13.6 12.0 - 15.0 g/dL   HCT 40.9 36.0 - 46.0 %   MCV 90.7 78.0 - 100.0 fL   MCH 30.2 26.0 - 34.0 pg   MCHC 33.3 30.0 - 36.0 g/dL   RDW 12.8 11.5 - 15.5 %   Platelets 238 150 - 400 K/uL  Troponin I     Status: None   Collection Time: 08/16/15 11:37 AM  Result Value Ref Range   Troponin I <0.03 <0.03 ng/mL  Hepatic function panel     Status: Abnormal   Collection Time: 08/16/15 11:37 AM  Result Value Ref Range   Total Protein 7.3 6.5 - 8.1 g/dL   Albumin 4.2 3.5 - 5.0 g/dL   AST 16 15 - 41 U/L   ALT 18 14 - 54 U/L   Alkaline Phosphatase 62 38 - 126 U/L   Total Bilirubin 0.4 0.3 - 1.2 mg/dL   Bilirubin, Direct <0.1 (L) 0.1 - 0.5 mg/dL   Indirect Bilirubin NOT CALCULATED 0.3 - 0.9 mg/dL  Lipase, blood     Status: None   Collection Time: 08/16/15 11:37 AM  Result Value Ref Range   Lipase 13 11 - 51 U/L  Urinalysis, Routine w reflex microscopic (not at [facility])     Status: None   Collection Time: 08/16/15 12:53 PM  Result Value Ref Range   Color, Urine YELLOW YELLOW  APPearance CLEAR CLEAR   Specific Gravity, Urine 1.020 1.005 - 1.030   pH 6.0 5.0 - 8.0   Glucose, UA NEGATIVE NEGATIVE mg/dL   Hgb urine dipstick NEGATIVE NEGATIVE   Bilirubin Urine NEGATIVE NEGATIVE   Ketones, ur NEGATIVE NEGATIVE mg/dL   Protein, ur NEGATIVE NEGATIVE mg/dL   Nitrite NEGATIVE NEGATIVE   Leukocytes, UA NEGATIVE NEGATIVE  Troponin I-serum (0, 3, 6 hours)     Status: None   Collection Time: 08/16/15  6:51 PM  Result Value Ref Range   Troponin I <0.03 <0.03 ng/mL  Troponin I-serum (0, 3, 6 hours)     Status: None   Collection Time: 08/16/15  9:00 PM  Result Value Ref Range   Troponin I <0.03 <0.03 ng/mL  Glucose, capillary     Status: Abnormal   Collection Time: 08/16/15  9:22 PM  Result Value Ref Range   Glucose-Capillary 133 (H) 65 - 99 mg/dL  Troponin I-serum (0, 3, 6 hours)     Status: None   Collection Time: 08/16/15 11:23 PM  Result Value Ref Range   Troponin I <0.03 <0.03 ng/mL  Glucose, capillary     Status: Abnormal   Collection Time: 08/17/15  7:19 AM  Result Value Ref Range   Glucose-Capillary 141 (H) 65 - 99 mg/dL   Comment 1 Notify RN    Comment 2 Document in Chart     Studies/Results: Dg Chest 2 View  08/16/2015  CLINICAL DATA:  Chest pressure, shortness of breath since yesterday. EXAM: CHEST  2 VIEW COMPARISON:  11/02/2014  FINDINGS: Heart and mediastinal contours are within normal limits. No focal opacities or effusions. No acute bony abnormality. IMPRESSION: No active cardiopulmonary disease. Electronically Signed   By: Rolm Baptise M.D.   On: 08/16/2015 11:51    Medications: Reviewed  @PROBHOSP @  1  CP  Resolved  I agree with plans for stress test today  Will continue to follow      Dorris Carnes 08/17/2015, 9:53 AM

## 2015-08-18 ENCOUNTER — Telehealth: Payer: Self-pay | Admitting: Internal Medicine

## 2015-08-18 NOTE — Telephone Encounter (Signed)
Lets have her trial a PPI for now (sent in omeprazole to the pharmacy). Schedule follow-up with [facility] to consider need for repeat EGD (as per her last note).

## 2015-08-18 NOTE — Telephone Encounter (Signed)
Pt called this morning with multiple questions and concerns. I told her she needed to speak with the nurse, but she wasn't available at the moment. I told her that I was going to transfer her to VM and she agreed.

## 2015-08-18 NOTE — Telephone Encounter (Signed)
Ignore the last note. Patient is already on PPI (wasn't listed in her last OP OV note). She needs an OV for further evaluation. If pain becomes severe/intolerable, intractable N/V, unable to stay hydrated, will need to be seen by ER.

## 2015-08-18 NOTE — Telephone Encounter (Signed)
Pt stated she has had a really bad episode with bloating, she is having bloating, pain and pressure. She feels like the bloating is causing pressure on her lungs and she is having SOB. She went to [facility] Wednesday and he sent her to the hospital and they did a stress test and checked her heart and it was all ok. She wants to know if there is anything she can take to help with this?

## 2015-08-21 ENCOUNTER — Encounter: Payer: Self-pay | Admitting: Family Medicine

## 2015-08-21 NOTE — Telephone Encounter (Signed)
Pt is aware of Eric's recommendations. She still has a lot of really bad pain in her chest at times, although cardiac tests were OK. Her bloating is very bad at times. We do not have appts til 09/28/2015. I have scheduled her an urgent with Laban Emperor, NP on 08/23/2015 at 3:30 PM. I asked pt to be ready earlier in the day, if we have cancellation, we will let her know to come before the urgent slot at 3:30 Pm and she said that she would.   Sending FYI to Laban Emperor, NP and to [facility] to call on Wed if we have a cancellation.

## 2015-08-23 ENCOUNTER — Ambulatory Visit (INDEPENDENT_AMBULATORY_CARE_PROVIDER_SITE_OTHER): Payer: Medicare Other | Admitting: Gastroenterology

## 2015-08-23 ENCOUNTER — Encounter: Payer: Self-pay | Admitting: Gastroenterology

## 2015-08-23 ENCOUNTER — Ambulatory Visit: Payer: Medicare Other | Admitting: Gastroenterology

## 2015-08-23 ENCOUNTER — Other Ambulatory Visit: Payer: Self-pay | Admitting: Family Medicine

## 2015-08-23 VITALS — BP 123/65 | HR 91 | Temp 97.6°F | Ht 64.0 in | Wt 166.4 lb

## 2015-08-23 DIAGNOSIS — E119 Type 2 diabetes mellitus without complications: Secondary | ICD-10-CM | POA: Diagnosis not present

## 2015-08-23 DIAGNOSIS — R140 Abdominal distension (gaseous): Secondary | ICD-10-CM

## 2015-08-23 NOTE — Progress Notes (Signed)
Referring Provider: [name], MD Primary Care Physician:  Mickie Hillier, MD  Primary GI: Dr. Gala Romney   Chief Complaint  Patient presents with  . Bloated    gets severe at times    HPI:   Deborah Gray is a 82 y.o. female presenting today with a history of multiple GI issues including nutcracker esophagus, remote esophageal strictures, esophageal spasms, much improved with imipramine. Recent EGD with reactive gastropathy. Here today due to severe bloating.    States she had been doing really well until last Thursday when she started bloating really bad. Denies "pain" and feels more of a "pressure". Gets really short of breath and feels like she has pressing on her diaphragm. Was preparing for a reunion and when reaching up for a bowl would get an ache in her arms and feel a pressure in her abdomen and feel very bloated like she had eaten a very big meal, like she had "overeaten". She denies "pigging out" at the reunion. Only ate a "minimal" amount. Achy feeling in neck, shoulders, elbow to elbow. Husband is going to be having surgery the end of the month, history of prostate cancer in 1991 and now has scar tissue and needs surgery. No nausea. Has buzzing in her ears, "inner ear problems". Denies diarrhea or constipation. Taking benefiber and a probiotic, which is helping to regulate her. When first starting the benefiber, it "gassed me up", but it is getting better.   Imipramine 1-2 tablets in evening. GES normal in 2013. No sugar added hot chocolate and lite cool whip. Tried avoiding both of those without any improvement. Eats a lot of dairy.   Past Medical History  Diagnosis Date  . Type 2 diabetes mellitus ([place])   . Dysphagia, pharyngoesophageal phase   . Esophageal reflux   . Constipation   . Benign neoplasm of colon   . Internal hemorrhoids without mention of complication   . Diaphragmatic hernia without mention of obstruction or gangrene   . Stricture and stenosis of  esophagus   . Unspecified gastritis and gastroduodenitis without mention of hemorrhage   . Diverticulosis of colon (without mention of hemorrhage)   . Essential hypertension   . Arthritis   . Heart murmur   . Hyperlipidemia   . PONV (postoperative nausea and vomiting)   . Hemorrhoids     Past Surgical History  Procedure Laterality Date  . Dilation and curettage of uterus    . Cholecystectomy    . Laparoscopic vaginal hysterectomy    . Rectocele repair    . Breast lumpectomy Right     benign  . Total knee arthroplasty Right   . Foot surgery Left   . Abdominal hysterectomy    . Cataract extraction Bilateral 2006    Implants in both, surgeries done 6 weeks apart  . Esophagogastroduodenoscopy  03/08/02    [name]: esophageal stricture, chronic gerd, s/p dilation.   . Colonoscopy  03/08/02    [name]: diverticulosis, internal hemorrhoids, adenomatous colon polyp  . Esophagogastroduodenoscopy  01/23/04    [name]: esophageal stricture, gastritis, hiatal hernia  . Colonoscopy  01/23/04    [facility]terson: diverticulosis, internal hemorrhoids  . Colonoscopy  09/25/05    [name]: diverticulosis  . Esophagogastroduodenoscopy  09/25/05    Patterson: gastritis, benign bx, no H.pylori  . Esophagogastroduodenoscopy  07/05/09    Patterson: gastropathy, benign small bowel bx and gastric bx  . Colonoscopy  07/05/09    [name]: severe diverticulosis  . Colonoscopy  01/21/11  Patterson: severe diverticulosis in sigmoid to desc colon, int hemorrhoids, follow up TCS in 5 years  . Esophageal manometry  03/21/08    Patterson: findings c/w Nutcracker Esophagus  . Esophagogastroduodenoscopy N/A 05/15/2015    Dr. Gala Romney- diffuse moderate inflammation characterized by congestion (edema), erythema, and linear erosions was found in the entire examined stomach. bx= reactive gastropathy  . Hemorrhoid banding  2017    Dr.[name]    Current Outpatient Prescriptions  Medication Sig Dispense Refill  . aspirin  81 MG tablet Take 81 mg by mouth at bedtime.      Marland Kitchen azelastine (ASTELIN) 0.1 % nasal spray Place 1 spray into both nostrils daily. Use in each nostril as directed    . ezetimibe (ZETIA) 10 MG tablet TAKE 1 TABLET (10 MG TOTAL) BY MOUTH DAILY. 90 tablet 2  . fexofenadine (ALLEGRA) 180 MG tablet Take 180 mg by mouth daily. Reported on 08/23/2015    . imipramine (TOFRANIL) 10 MG tablet Take 3 tablets (30 mg total) by mouth at bedtime. (Patient taking differently: Take 10-20 mg by mouth at bedtime. Pt only takes one or two tablets nightly) 90 tablet 5  . Ketotifen Fumarate (THERA TEARS ALLERGY OP) Apply 1 drop to eye 2 (two) times daily.     . meclizine (ANTIVERT) 25 MG tablet TAKE 1 TABLET (25 MG TOTAL) BY MOUTH 3 (THREE) TIMES DAILY AS NEEDED FOR DIZZINESS OR NAUSEA. 30 tablet 1  . metFORMIN (GLUCOPHAGE) 500 MG tablet TAKE 1 TABLET (500 MG TOTAL) BY MOUTH 2 (TWO) TIMES DAILY. 180 tablet 1  . mirabegron ER (MYRBETRIQ) 25 MG TB24 tablet Take 25 mg by mouth daily.    . Multiple Vitamins-Minerals (PRESERVISION AREDS 2) CAPS Take 1 capsule by mouth 2 (two) times daily. Reported on 08/23/2015    . nateglinide (STARLIX) 120 MG tablet TAKE 1 TABLET BY MOUTH 3 TIMES A DAY BEFORE MEALS 270 tablet 1  . Omega-3 Fatty Acids (FISH OIL) 1200 MG CAPS Take 4 capsules by mouth daily.     Marland Kitchen omeprazole (PRILOSEC) 20 MG capsule Take 1 capsule (20 mg total) by mouth daily before breakfast. 90 capsule 3  . Pancrelipase, Lip-Prot-Amyl, (CREON) 24000 units CPEP Take one capsule 3 times a day with meals and one capsule with snacks. 450 capsule 3  . Probiotic Product (ALIGN) 4 MG CAPS Take 1 capsule by mouth daily. Reported on 06/06/2015    . trimethoprim (TRIMPEX) 100 MG tablet Take 100 mg by mouth at bedtime.  11  . VITAMIN E PO Take 1 capsule by mouth 4 (four) times a week. Four times per week    . Vitamin Mixture (ESTER-C) 500-60 MG TABS Take 1 tablet by mouth 4 (four) times a week. 1 tab three times weekly     . Wheat Dextrin  (BENEFIBER PO) Take 1 packet by mouth daily.     . nitrofurantoin, macrocrystal-monohydrate, (MACROBID) 100 MG capsule Take 100 mg by mouth at bedtime. Reported on 08/23/2015  1  . valsartan (DIOVAN) 80 MG tablet TAKE 1 TABLET (80 MG TOTAL) BY MOUTH DAILY. 90 tablet 1   No current facility-administered medications for this visit.    Allergies as of 08/23/2015 - Review Complete 08/23/2015  Allergen Reaction Noted  . Estrogens Other (See Comments) 10/15/2012  . Other  09/01/2013  . Sulfonamide derivatives Rash 03/01/2008    Family History  Problem Relation Age of Onset  . Ovarian cancer Mother   . Breast cancer Sister   . Liver cancer Sister   .  Colon cancer Cousin     Paternal side  . Diabetes Maternal Aunt   . Diabetes Father   . Heart disease Father   . Liver disease Cousin     Maternal side, never drank    Social History   Social History  . Marital Status: Married    Spouse Name: N/A  . Number of Children: 3  . Years of Education: N/A   Occupational History  . Retired    Social History Main Topics  . Smoking status: Never Smoker   . Smokeless tobacco: Never Used     Comment: Never smoked  . Alcohol Use: No  . Drug Use: No  . Sexual Activity: Not Asked   Other Topics Concern  . None   Social History Narrative    Review of Systems: As mentioned in HPI   Physical Exam: BP 123/65 mmHg  Pulse 91  Temp(Src) 97.6 F (36.4 C) (Oral)  Ht 5\' 4"  (1.626 m)  Wt 166 lb 6.4 oz (75.479 kg)  BMI 28.55 kg/m2 General:   Alert and oriented. No distress noted. Pleasant and cooperative.  Head:  Normocephalic and atraumatic. Eyes:  Conjuctiva clear without scleral icterus. Abdomen:  +BS, soft, non-tender and non-distended. No rebound or guarding. No HSM or masses noted. Extremities:  Without edema. Cervical Nodes:  No significant cervical adenopathy. Psych:  Alert and cooperative. Normal mood and affect.    CT Oct 2016: mild focal diverticulitis in distal sigmoid  colon

## 2015-08-23 NOTE — Patient Instructions (Addendum)
I have scheduled you for a hydrogen breath test to test for bacterial overgrowth.   I would like for you to follow a lactose-free diet.   Try to avoid sugar-free substitutes as well, as this can cause bloating and uncomfortable symptoms.     Lactose-Free Diet, Adult If you have lactose intolerance, you are not able to digest lactose. Lactose is a natural sugar found mainly in milk and milk products. You may need to avoid all foods and beverages that contain lactose. A lactose-free diet can help you do this.  WHAT DO I NEED TO KNOW ABOUT THIS DIET?  Do not consume foods, beverages, vitamins, minerals, or medicines with lactose. Read ingredients lists carefully.  Look for the words "lactose-free" on labels.  Use lactase enzyme drops or tablets as directed by your health care provider.  Use lactose-free milk or a milk alternative, such as soy milk, for drinking and cooking.  Make sure you get enough calcium and vitamin D in your diet. A lactose-free eating plan can be lacking in these important nutrients.  Take calcium and vitamin D supplements as directed by your health care provider. Talk to your provider about supplements if you are not able to get enough calcium and vitamin D from food. WHICH FOODS HAVE LACTOSE? Lactose is found in:   Milk and foods made from milk.  Yogurt.   Cheese.  Butter.   Margarine.   Sour cream.   Cream.   Whipped toppings and nondairy creamers.  Ice cream and other milk-based desserts. Lactose is also found in foods or products made with milk or milk ingredients. To find out whether a food contains milk or a milk ingredient, look at the ingredients list. Avoid foods with the statement "May contain milk" and foods that contain:   Butter.   Cream.  Milk.  Milk solids.  Milk powder.   Whey.  Curd.  Caseinate.  Lactose.  Lactalbumin.  Lactoglobulin. WHAT ARE SOME ALTERNATIVES TO MILK AND FOODS MADE WITH MILK  PRODUCTS?  Lactose-free milk.  Soy milk with added calcium and vitamin D.  Almond, coconut, or rice milk with added calcium and vitamin D. Note that these are low in protein.   Soy products, such as soy yogurt, soy cheese, soy ice cream, and soy-based sour cream. WHICH FOODS CAN I EAT? Grains Breads and rolls made without milk, such as [place], [place], or [place] bread, bagels, pita, and [employer]. Corn tortillas, corn meal, grits, and polenta. Crackers without lactose or milk solids, such as soda crackers and graham crackers. Cooked or dry cereals without lactose or milk solids. Pasta, quinoa, couscous, barley, oats, bulgur, farro, rice, wild rice, or other grains prepared without milk or lactose. Plain popcorn.  Vegetables Fresh, frozen, and canned vegetables without cheese, cream, or butter sauces. Fruits All fresh, canned, frozen, or dried fruits that are not processed with lactose. Meats and Other Protein Sources Plain beef, chicken, fish, [place], lamb, veal, pork, wild game, or ham. Kosher-prepared meat products. Strained or junior meats that do not contain milk. Eggs. Soy meat substitutes. Beans, lentils, and hummus. Tofu. Nuts and seeds. Peanut or other nut butters without lactose. Soups, casseroles, and mixed dishes without cheese, cream, or milk.  Dairy Lactose-free milk. Soy, rice, or almond milk with added calcium and vitamin D. Soy cheese and yogurt. Beverages Carbonated drinks. Tea. Coffee, freeze-dried coffee, and some instant coffees. Fruit and vegetable juices.  Condiments Soy sauce. Carob powder. Olives. Gravy made with water. Baker's cocoa. Angie Fava. Pure  seasonings and spices. Ketchup. Mustard. Bouillon. Broth.  Sweets and Desserts Water and fruit ices. Gelatin. Cookies, pies, or cakes made from allowed ingredients, such as angel food cake. Pudding made with water or a milk substitute. Lactose-free tofu desserts. Soy, coconut milk, or rice-milk-based frozen desserts. Sugar.  Honey. Jam, jelly, and marmalade. Molasses. Pure sugar candy. Dark chocolate without milk. Marshmallows.  Fats and Oils Margarines and salad dressings that do not contain milk. Berniece Salines. Vegetable oils. Shortening. Mayonnaise. Soy or coconut-based cream.  The items listed above may not be a complete list of recommended foods or beverages. Contact your dietitian for more options.  WHICH FOODS ARE NOT RECOMMENDED? Grains Breads and rolls that contain milk. Toaster pastries. Muffins, biscuits, waffles, cornbread, and pancakes. These can be prepared at home, commercial, or from mixes. Sweet rolls, donuts, English muffins, fry bread, lefse, flour tortillas with lactose, or [place] toast made with milk or milk ingredients. Crackers that contain lactose. Corn curls. Cooked or dry cereals with lactose. Vegetables Creamed or breaded vegetables. Vegetables in a cheese or butter sauce or with lactose-containing margarines. Instant potatoes. [place] fries. Scalloped or au gratin potatoes.  Fruits None.  Meats and Other Protein Sources Scrambled eggs, omelets, and souffles that contain milk. Creamed or breaded meat, fish, chicken, or [place]. Sausage products, such as wieners and liver sausage. Cold cuts that contain milk solids. Cheese, cottage cheese, ricotta cheese, and cheese spreads. Lasagna and macaroni and cheese. Pizza. Peanut or other nut butters with added milk solids. Casseroles or mixed dishes containing milk or cheese.  Dairy All dairy products, including milk, goat's milk, buttermilk, kefir, acidophilus milk, flavored milk, evaporated milk, condensed milk, dulce de [place], eggnog, yogurt, cheese, and cheese spreads.  Beverages Hot chocolate. Cocoa with lactose. Instant iced teas. Powdered fruit drinks. Smoothies made with milk or yogurt.  Condiments Chewing gum that has lactose. Cocoa that has lactose. Spice blends if they contain milk products. Artificial sweeteners that contain lactose. Nondairy  creamers.  Sweets and Desserts Ice cream, ice milk, gelato, sherbet, and frozen yogurt. Custard, pudding, and mousse. Cake, cream pies, cookies, and other desserts containing milk, cream, cream cheese, or milk chocolate. Pie crust made with milk-containing margarine or butter. Reduced-calorie desserts made with a sugar substitute that contains lactose. Toffee and butterscotch. Milk, white, or dark chocolate that contains milk. Fudge. Caramel.  Fats and Oils Margarines and salad dressings that contain milk or cheese. Cream. Half and half. Cream cheese. Sour cream. Chip dips made with sour cream or yogurt.  The items listed above may not be a complete list of foods and beverages to avoid. Contact your dietitian for more information. AM I GETTING ENOUGH CALCIUM? Calcium is found in many foods that contain lactose and is important for bone health. The amount of calcium you need depends on your age:   Adults younger than 50 years: 1000 mg of calcium a day.  Adults older than 50 years: 1200 mg of calcium a day. If you are not getting enough calcium, other calcium sources include:   Orange juice with calcium added. There are 300-350 mg of calcium in 1 cup of orange juice.   Sardines with edible bones. There are 325 mg of calcium in 3 oz of sardines.   Calcium-fortified soy milk. There are 300-400 mg of calcium in 1 cup of calcium-fortified soy milk.  Calcium-fortified rice or almond milk. There are 300 mg of calcium in 1 cup of calcium-fortified rice or almond milk.  Canned salmon with edible  bones. There are 180 mg of calcium in 3 oz of canned salmon with edible bones.   Calcium-fortified breakfast cereals. There are [phone] mg of calcium in calcium-fortified breakfast cereals.   Tofu set with calcium sulfate. There are 250 mg of calcium in  cup of tofu set with calcium sulfate.  Spinach, cooked. There are 145 mg of calcium in  cup of cooked spinach.  Edamame, cooked. There are 130  mg of calcium in  cup of cooked edamame.  Collard greens, cooked. There are 125 mg of calcium in  cup of cooked collard greens.  Kale, frozen or cooked. There are 90 mg of calcium in  cup of cooked or frozen kale.   Almonds. There are 95 mg of calcium in  cup of almonds.  Broccoli, cooked. There are 60 mg of calcium in 1 cup of cooked broccoli.   This information is not intended to replace advice given to you by your health care provider. Make sure you discuss any questions you have with your health care provider.   Document Released: 07/27/2001 Document Revised: 06/21/2014 Document Reviewed: 05/07/2013 Elsevier Interactive Patient Education [employer].

## 2015-08-28 DIAGNOSIS — H353221 Exudative age-related macular degeneration, left eye, with active choroidal neovascularization: Secondary | ICD-10-CM | POA: Diagnosis not present

## 2015-08-29 ENCOUNTER — Ambulatory Visit (INDEPENDENT_AMBULATORY_CARE_PROVIDER_SITE_OTHER): Payer: Medicare Other | Admitting: Family Medicine

## 2015-08-29 ENCOUNTER — Encounter: Payer: Self-pay | Admitting: Family Medicine

## 2015-08-29 VITALS — BP 138/74 | Ht 64.0 in | Wt 167.4 lb

## 2015-08-29 DIAGNOSIS — R0789 Other chest pain: Secondary | ICD-10-CM | POA: Diagnosis not present

## 2015-08-29 DIAGNOSIS — M25511 Pain in right shoulder: Secondary | ICD-10-CM | POA: Diagnosis not present

## 2015-08-29 NOTE — Progress Notes (Signed)
   Subjective:    Patient ID: Deborah Gray, female    DOB: 1933/06/13, 82 y.o.   MRN: [id number]  Shoulder Pain  The pain is present in the right shoulder. This is a new problem. The current episode started 1 to 4 weeks ago. Quality: soreness. Exacerbated by: Movement. Treatments tried: Aleve.  Chest Pain  This is a recurrent problem. The problem has been rapidly improving. The quality of the pain is described as pressure.   Entire hospital records reviewed in patient's presence. She presented to our office and atypical chest pain. EKG did reveal some changes particularly inferiorly. This was concerning with multiple risk factors and patient symptomatology.  Was admitted to the hospital MI was essentially ruled out. Next  Did have stress test which was negative.  Continues to experience right shoulder pain. Worse with certain motions. On retrospect did some extra heavy lifting prior to all this happening Patient in today for a hospital follow up for chest pain. Also has concerns of right shoulder pain.  Review of Systems  Cardiovascular: Positive for chest pain.  No headache, no major weight loss or weight gain, no chest pain no back pain abdominal pain no change in bowel habits complete ROS otherwise negative      Objective:   Physical Exam  Alert vitals stable, NAD. Blood pressure good on repeat. HEENT normal. Lungs clear. Heart regular rate and rhythm. Right shoulder positive impingement sign no point tenderness no deformity strength sensation intact in      Assessment & Plan:  Impression status post chest pain admission/MI ruled out and low risk stratification with stress test. Persistent shoulder pain likely musculoskeletal with impingement element discussed plan initiate Tylenol twice. Range of motion exercises expect gradual resolution

## 2015-08-30 DIAGNOSIS — R140 Abdominal distension (gaseous): Secondary | ICD-10-CM | POA: Insufficient documentation

## 2015-08-30 NOTE — Assessment & Plan Note (Signed)
82 year old very pleasant female with multiple chronic GI issues that has noted much improvement in upper GI symptoms with imipramine but notes vague bloating. I feel this may be multifactorial, as she notes eating a lot of dairy, sugar-free substances, and may be related to this. No abdominal pain per se. Constipation appears to be adequately treated. Abdominal exam benign. Discussed a lactose free diet, avoidance of sugar-free substances, and proceeding with hydrogen breath test. As of note, GES was normal in 2013. EGD up-to-date. May need to revisit the idea of a colonoscopy in the future if vague bloating persist. Further recommendations after HBT.

## 2015-08-30 NOTE — Progress Notes (Signed)
cc'ed to pcp °

## 2015-09-04 ENCOUNTER — Other Ambulatory Visit: Payer: Self-pay

## 2015-09-04 DIAGNOSIS — R140 Abdominal distension (gaseous): Secondary | ICD-10-CM

## 2015-09-06 ENCOUNTER — Ambulatory Visit (INDEPENDENT_AMBULATORY_CARE_PROVIDER_SITE_OTHER): Payer: Medicare Other | Admitting: Urology

## 2015-09-06 ENCOUNTER — Encounter (HOSPITAL_COMMUNITY)
Admission: RE | Disposition: A | Discharge status: Home / Self Care | Payer: Self-pay | Source: Ambulatory Visit | Attending: Internal Medicine

## 2015-09-06 ENCOUNTER — Ambulatory Visit (HOSPITAL_COMMUNITY)
Admission: RE | Admit: 2015-09-06 | Discharge: 2015-09-06 | Disposition: A | Discharge status: Home / Self Care | Payer: Medicare Other | Source: Ambulatory Visit | Attending: Internal Medicine | Admitting: Internal Medicine

## 2015-09-06 DIAGNOSIS — N3020 Other chronic cystitis without hematuria: Secondary | ICD-10-CM

## 2015-09-06 DIAGNOSIS — R140 Abdominal distension (gaseous): Secondary | ICD-10-CM

## 2015-09-06 HISTORY — PX: BACTERIAL OVERGROWTH TEST: SHX5739

## 2015-09-06 SURGERY — BREATH TEST, FOR INTESTINAL BACTERIAL OVERGROWTH

## 2015-09-06 MED ORDER — LACTULOSE 10 GM/15ML PO SOLN
ORAL | Status: AC
  Administered 2015-09-06: 20 g via OROMUCOSAL
  Filled 2015-09-06: qty 60

## 2015-09-06 MED ORDER — LACTULOSE 10 GM/15ML PO SOLN
20.0000 g | Freq: Once | ORAL | Status: AC
  Administered 2015-09-06: 20 g via OROMUCOSAL

## 2015-09-06 MED ORDER — SODIUM CHLORIDE 0.9 % IV SOLN: INTRAVENOUS | Status: DC

## 2015-09-06 NOTE — Progress Notes (Addendum)
No beans, bran or high fiber cereal the day before the procedure? no NPO except for water 12 hours before procedure? yes No smoking, sleeping or vigorous exercising for at least 30 before procedure? no Recent antibiotic use and/or diarrhea? no   If yes, physician notified.  Time Baseline 15 mins 30 mins 45 mins 60 mins 75 mins 90 mins 105 mins 120 mins 135 mins 150 mins 165 mins 180 mins  H2-ppm 5 7 8 6  3 4 9 13  19 22 25 30 31      Patient: Deborah Gray Pre-operative diagnosis: Bloating Post-operative diagnosis: Unlikely small bowel bacterial overgrowth   Procedure: Hydrogen Breath Test  Medication used: lactulose   Findings:  Baseline at 0 minutes: 5, with no absolute increase greater than 20 ppm within 90 minutes nor was there a value greater than 20 in first 120 minutes. Unlikely small bowel bacterial overgrowth.     As of note, this is a late entry to complete documentation for hydrogen breath test. Patient is already aware of findings and has been seen multiple times in the office since completed. Results were reviewed with patient at those times. This was inadvertently not routed to the appropriate reading provider when it was initially completed.   Annitta Needs, ANP-BC [facility]

## 2015-09-08 ENCOUNTER — Encounter (HOSPITAL_COMMUNITY): Payer: Self-pay | Admitting: Internal Medicine

## 2015-09-22 ENCOUNTER — Encounter: Payer: Self-pay | Admitting: Internal Medicine

## 2015-09-25 ENCOUNTER — Encounter: Payer: Self-pay | Admitting: Family Medicine

## 2015-09-25 ENCOUNTER — Ambulatory Visit (INDEPENDENT_AMBULATORY_CARE_PROVIDER_SITE_OTHER): Payer: Medicare Other | Admitting: Family Medicine

## 2015-09-25 VITALS — Temp 97.9°F | Ht 64.0 in | Wt 167.8 lb

## 2015-09-25 DIAGNOSIS — J019 Acute sinusitis, unspecified: Secondary | ICD-10-CM

## 2015-09-25 DIAGNOSIS — J309 Allergic rhinitis, unspecified: Secondary | ICD-10-CM | POA: Diagnosis not present

## 2015-09-25 DIAGNOSIS — B9689 Other specified bacterial agents as the cause of diseases classified elsewhere: Secondary | ICD-10-CM

## 2015-09-25 MED ORDER — AMOXICILLIN 500 MG PO TABS: 500.0000 mg | ORAL_TABLET | Freq: Three times a day (TID) | ORAL | 0 refills | Status: DC

## 2015-09-25 MED ORDER — BENZONATATE 100 MG PO CAPS: 100.0000 mg | ORAL_CAPSULE | Freq: Three times a day (TID) | ORAL | 3 refills | Status: DC | PRN

## 2015-09-25 NOTE — Progress Notes (Signed)
   Subjective:    Patient ID: Deborah Gray, female    DOB: 1933/06/01, 82 y.o.   MRN: [id number]  Cough  This is a new problem. The current episode started 1 to 4 weeks ago. Associated symptoms include nasal congestion and rhinorrhea. Pertinent negatives include no chest pain, ear pain, fever, shortness of breath or wheezing. Treatments tried: allegra, mucinex, nose spray.      Review of Systems  Constitutional: Negative for activity change and fever.  HENT: Positive for congestion and rhinorrhea. Negative for ear pain.   Eyes: Negative for discharge.  Respiratory: Positive for cough. Negative for shortness of breath and wheezing.   Cardiovascular: Negative for chest pain.       Objective:   Physical Exam  Constitutional: She appears well-developed.  HENT:  Head: Normocephalic.  Nose: Nose normal.  Mouth/Throat: Oropharynx is clear and moist. No oropharyngeal exudate.  Neck: Neck supple.  Cardiovascular: Normal rate and normal heart sounds.   No murmur heard. Pulmonary/Chest: Effort normal and breath sounds normal. She has no wheezes.  Lymphadenopathy:    She has no cervical adenopathy.  Skin: Skin is warm and dry.  Nursing note and vitals reviewed.         Assessment & Plan:  Patient was seen today for upper respiratory illness. It is felt that the patient is dealing with sinusitis. Antibiotics were prescribed today. Importance of compliance with medication was discussed. Symptoms should gradually resolve over the course of the next several days. If high fevers, progressive illness, difficulty breathing, worsening condition or failure for symptoms to improve over the next several days then the patient is to follow-up. If any emergent conditions the patient is to follow-up in the emergency department otherwise to follow-up in the office.

## 2015-10-04 ENCOUNTER — Ambulatory Visit (INDEPENDENT_AMBULATORY_CARE_PROVIDER_SITE_OTHER): Payer: Medicare Other | Admitting: Urology

## 2015-10-04 DIAGNOSIS — N3020 Other chronic cystitis without hematuria: Secondary | ICD-10-CM | POA: Diagnosis not present

## 2015-10-04 DIAGNOSIS — R300 Dysuria: Secondary | ICD-10-CM | POA: Diagnosis not present

## 2015-10-04 DIAGNOSIS — N3011 Interstitial cystitis (chronic) with hematuria: Secondary | ICD-10-CM

## 2015-10-05 DIAGNOSIS — H353221 Exudative age-related macular degeneration, left eye, with active choroidal neovascularization: Secondary | ICD-10-CM | POA: Diagnosis not present

## 2015-10-12 DIAGNOSIS — E119 Type 2 diabetes mellitus without complications: Secondary | ICD-10-CM | POA: Diagnosis not present

## 2015-10-18 ENCOUNTER — Other Ambulatory Visit: Payer: Self-pay | Admitting: Physician Assistant

## 2015-10-18 DIAGNOSIS — D239 Other benign neoplasm of skin, unspecified: Secondary | ICD-10-CM | POA: Diagnosis not present

## 2015-10-18 DIAGNOSIS — D485 Neoplasm of uncertain behavior of skin: Secondary | ICD-10-CM | POA: Diagnosis not present

## 2015-10-18 DIAGNOSIS — L820 Inflamed seborrheic keratosis: Secondary | ICD-10-CM | POA: Diagnosis not present

## 2015-10-24 ENCOUNTER — Other Ambulatory Visit (HOSPITAL_COMMUNITY)
Admission: RE | Admit: 2015-10-24 | Discharge: 2015-10-24 | Disposition: A | Discharge status: Home / Self Care | Payer: Medicare Other | Source: Skilled Nursing Facility | Attending: Urology | Admitting: Urology

## 2015-10-24 DIAGNOSIS — N390 Urinary tract infection, site not specified: Secondary | ICD-10-CM | POA: Insufficient documentation

## 2015-10-24 LAB — URINALYSIS, ROUTINE W REFLEX MICROSCOPIC
BILIRUBIN URINE: NEGATIVE
Glucose, UA: NEGATIVE mg/dL
Hgb urine dipstick: NEGATIVE
KETONES UR: NEGATIVE mg/dL
Leukocytes, UA: NEGATIVE
NITRITE: NEGATIVE
PH: 6 (ref 5.0–8.0)
Protein, ur: NEGATIVE mg/dL
Specific Gravity, Urine: 1.01 (ref 1.005–1.030)

## 2015-10-25 ENCOUNTER — Ambulatory Visit (INDEPENDENT_AMBULATORY_CARE_PROVIDER_SITE_OTHER): Payer: Medicare Other | Admitting: Urology

## 2015-10-25 DIAGNOSIS — N3011 Interstitial cystitis (chronic) with hematuria: Secondary | ICD-10-CM

## 2015-10-25 DIAGNOSIS — R300 Dysuria: Secondary | ICD-10-CM

## 2015-10-25 DIAGNOSIS — N3020 Other chronic cystitis without hematuria: Secondary | ICD-10-CM

## 2015-10-26 LAB — URINE CULTURE: Culture: NO GROWTH

## 2015-11-01 ENCOUNTER — Other Ambulatory Visit (HOSPITAL_COMMUNITY)
Admission: AD | Admit: 2015-11-01 | Discharge: 2015-11-01 | Disposition: A | Discharge status: Home / Self Care | Payer: Medicare Other | Source: Skilled Nursing Facility | Attending: Urology | Admitting: Urology

## 2015-11-01 ENCOUNTER — Ambulatory Visit (INDEPENDENT_AMBULATORY_CARE_PROVIDER_SITE_OTHER): Payer: Medicare Other | Admitting: Urology

## 2015-11-01 DIAGNOSIS — R319 Hematuria, unspecified: Secondary | ICD-10-CM | POA: Insufficient documentation

## 2015-11-01 DIAGNOSIS — N3010 Interstitial cystitis (chronic) without hematuria: Secondary | ICD-10-CM

## 2015-11-03 ENCOUNTER — Encounter: Payer: Self-pay | Admitting: Gastroenterology

## 2015-11-03 ENCOUNTER — Ambulatory Visit (INDEPENDENT_AMBULATORY_CARE_PROVIDER_SITE_OTHER): Payer: Medicare Other | Admitting: Gastroenterology

## 2015-11-03 VITALS — BP 115/72 | HR 70 | Temp 98.0°F | Ht 64.0 in | Wt 165.0 lb

## 2015-11-03 DIAGNOSIS — K589 Irritable bowel syndrome without diarrhea: Secondary | ICD-10-CM

## 2015-11-03 DIAGNOSIS — K861 Other chronic pancreatitis: Secondary | ICD-10-CM

## 2015-11-03 DIAGNOSIS — K224 Dyskinesia of esophagus: Secondary | ICD-10-CM

## 2015-11-03 NOTE — Patient Instructions (Signed)
1. Overall I think you are doing well from your stomach issues. If your symptoms become more frequent with bloating and abdominal pain, we will likely offer you updated colonoscopy or imaging of your abdomen. I will review case with Dr. Gala Romney as well. Further recommendations to follow.

## 2015-11-03 NOTE — Progress Notes (Signed)
Primary Care Physician: Mickie Hillier, MD  Primary Gastroenterologist:  Garfield Cornea, MD   Chief Complaint  Patient presents with  . Follow-up    HPI: Deborah Gray is a 82 y.o. female here for f/u. Last seen in 08/2015. H/o multiple GI issues including nutcracker esophagus, remote esophageal strictures, esophageal spasms, improved on imipramine, idiopathic chronic pancreatitis, IBS. Also with frequent UTIs, interstitial cystitis. EGD with reactive gastropathy earlier this year. H/o severe bloating. Fine until this week since 07/2015. Wednesday started bloating again. 3-4 loose stools. Ok yesterday. Imipramine 10mg  at bedtime has helped choking feeling. Patient had HBT 09/06/15. Official report not located. Reviewing values it was negative study for small bowel bacterial overgrowth.   Patient reports benefiber is keeping her regular. Was doing well until last week. Had some constipation followed by bloating. Symptoms lasted for several days. No dysphagia, heartburn. No melena, brbpr. Please with hemorrhoid banding results.    Current Outpatient Prescriptions  Medication Sig Dispense Refill  . acetaminophen (TYLENOL) 500 MG tablet Take 500 mg by mouth at bedtime.    Marland Kitchen aspirin 81 MG tablet Take 81 mg by mouth at bedtime.      Marland Kitchen azelastine (ASTELIN) 0.1 % nasal spray Place 1 spray into both nostrils daily. Use in each nostril as directed    . benzonatate (TESSALON) 100 MG capsule Take 1 capsule (100 mg total) by mouth 3 (three) times daily as needed for cough. 45 capsule 3  . ezetimibe (ZETIA) 10 MG tablet TAKE 1 TABLET (10 MG TOTAL) BY MOUTH DAILY. 90 tablet 2  . fexofenadine (ALLEGRA) 180 MG tablet Take 180 mg by mouth daily. Reported on 08/23/2015    . imipramine (TOFRANIL) 10 MG tablet Take 3 tablets (30 mg total) by mouth at bedtime. (Patient taking differently: Take 10 mg by mouth at bedtime. ) 90 tablet 5  . Ketotifen Fumarate (THERA TEARS ALLERGY OP) Apply 1 drop to eye 2 (two)  times daily.     . meclizine (ANTIVERT) 25 MG tablet TAKE 1 TABLET (25 MG TOTAL) BY MOUTH 3 (THREE) TIMES DAILY AS NEEDED FOR DIZZINESS OR NAUSEA. 30 tablet 1  . metFORMIN (GLUCOPHAGE) 500 MG tablet TAKE 1 TABLET (500 MG TOTAL) BY MOUTH 2 (TWO) TIMES DAILY. 180 tablet 1  . mirabegron ER (MYRBETRIQ) 25 MG TB24 tablet Take 25 mg by mouth daily.    . Multiple Vitamins-Minerals (PRESERVISION AREDS 2) CAPS Take 1 capsule by mouth 2 (two) times daily. Reported on 08/23/2015    . nateglinide (STARLIX) 120 MG tablet TAKE 1 TABLET BY MOUTH 3 TIMES A DAY BEFORE MEALS 270 tablet 1  . Omega-3 Fatty Acids (FISH OIL) 1200 MG CAPS Take 4 capsules by mouth daily.     Marland Kitchen omeprazole (PRILOSEC) 20 MG capsule Take 1 capsule (20 mg total) by mouth daily before breakfast. 90 capsule 3  . Pancrelipase, Lip-Prot-Amyl, (CREON) 24000 units CPEP Take one capsule 3 times a day with meals and one capsule with snacks. 450 capsule 3  . Probiotic Product (ALIGN) 4 MG CAPS Take 1 capsule by mouth daily. Reported on 06/06/2015    . trimethoprim (TRIMPEX) 100 MG tablet Take 100 mg by mouth at bedtime.  11  . valsartan (DIOVAN) 80 MG tablet TAKE 1 TABLET (80 MG TOTAL) BY MOUTH DAILY. 90 tablet 1  . VITAMIN E PO Take 1 capsule by mouth 4 (four) times a week. Four times per week    . Vitamin Mixture (ESTER-C) 500-60 MG  TABS Take 1 tablet by mouth 4 (four) times a week. 1 tab three times weekly     . Wheat Dextrin (BENEFIBER PO) Take 1 packet by mouth daily.      No current facility-administered medications for this visit.     Allergies as of 11/03/2015 - Review Complete 11/03/2015  Allergen Reaction Noted  . Estrogens Other (See Comments) 10/15/2012  . Other  09/01/2013  . Sulfonamide derivatives Rash 03/01/2008    ROS:  General: Negative for anorexia, weight loss, fever, chills, fatigue, weakness. ENT: Negative for hoarseness, difficulty swallowing , nasal congestion. CV: Negative for chest pain, angina, palpitations, dyspnea  on exertion, peripheral edema.  Respiratory: Negative for dyspnea at rest, dyspnea on exertion, cough, sputum, wheezing.  GI: See history of present illness. GU:  Negative for dysuria, hematuria, urinary incontinence, urinary frequency, nocturnal urination. frequent utis.  Endo: Negative for unusual weight change.    Physical Examination:   BP 115/72   Pulse 70   Temp 98 F (36.7 C) (Oral)   Ht 5\' 4"  (1.626 m)   Wt 165 lb (74.8 kg)   BMI 28.32 kg/m   General: Well-nourished, well-developed in no acute distress.  Eyes: No icterus. Mouth: Oropharyngeal mucosa moist and pink , no lesions erythema or exudate. Lungs: Clear to auscultation bilaterally.  Heart: Regular rate and rhythm, no murmurs rubs or gallops.  Abdomen: Bowel sounds are normal, nontender, nondistended, no hepatosplenomegaly or masses, no abdominal bruits or hernia , no rebound or guarding.   Extremities: No lower extremity edema. No clubbing or deformities. Neuro: Alert and oriented x 4   Skin: Warm and dry, no jaundice.   Psych: Alert and cooperative, normal mood and affect.

## 2015-11-05 LAB — URINE CULTURE: Culture: >=100000 — AB

## 2015-11-08 DIAGNOSIS — H353221 Exudative age-related macular degeneration, left eye, with active choroidal neovascularization: Secondary | ICD-10-CM | POA: Diagnosis not present

## 2015-11-09 NOTE — Assessment & Plan Note (Signed)
BM stable on benefiber with only occasional constipation/bloating. Overall she has done well. Recent bloating episode lasted for several days. Would be due for surveillance TCS this year. To address with Dr. Gala Romney, given her age. Further recommendations to follow.

## 2015-11-09 NOTE — Assessment & Plan Note (Signed)
Overall stable on Creon. Intermittent episodes of bloating, most recently in setting of constipation and UTI. No evidence of diarrhea of malabsorption.

## 2015-11-09 NOTE — Assessment & Plan Note (Signed)
Doing well on imipramine.

## 2015-11-10 ENCOUNTER — Ambulatory Visit (INDEPENDENT_AMBULATORY_CARE_PROVIDER_SITE_OTHER): Payer: Medicare Other | Admitting: Urology

## 2015-11-10 DIAGNOSIS — N3020 Other chronic cystitis without hematuria: Secondary | ICD-10-CM | POA: Diagnosis not present

## 2015-11-10 DIAGNOSIS — N3011 Interstitial cystitis (chronic) with hematuria: Secondary | ICD-10-CM

## 2015-11-10 DIAGNOSIS — R300 Dysuria: Secondary | ICD-10-CM

## 2015-11-13 NOTE — Progress Notes (Signed)
CC'D TO PCP °

## 2015-11-16 ENCOUNTER — Other Ambulatory Visit: Payer: Self-pay | Admitting: Family Medicine

## 2015-11-16 DIAGNOSIS — Z1231 Encounter for screening mammogram for malignant neoplasm of breast: Secondary | ICD-10-CM

## 2015-11-23 ENCOUNTER — Ambulatory Visit (HOSPITAL_COMMUNITY)
Admission: RE | Admit: 2015-11-23 | Discharge: 2015-11-23 | Disposition: A | Discharge status: Home / Self Care | Payer: Medicare Other | Source: Ambulatory Visit | Attending: Family Medicine | Admitting: Family Medicine

## 2015-11-23 ENCOUNTER — Telehealth: Payer: Self-pay

## 2015-11-23 DIAGNOSIS — Z1231 Encounter for screening mammogram for malignant neoplasm of breast: Secondary | ICD-10-CM | POA: Diagnosis not present

## 2015-11-23 NOTE — Telephone Encounter (Signed)
Pt called to let us know that she is having some weakness in her legs and bloating in her stomach when that happens it is pitting presser on her chest. Please advise

## 2015-11-24 NOTE — Telephone Encounter (Signed)
Spoke with the pt, she said she had a episode of diarrhea on Tuesday and has been bloated ever since. She is feeling better now but still bloated. She is following a bland diet. Some nausea at times but no vomiting. She is taking her creon. Pt said she didn't want [place] to do anything now, she just wanted to let [facility] know and have this documented in her chart.

## 2015-11-27 ENCOUNTER — Other Ambulatory Visit: Payer: Self-pay | Admitting: Family Medicine

## 2015-11-28 NOTE — Telephone Encounter (Signed)
Noted. Please let patient know that right now we will hold off on colonoscopy. The last time she had adenomatous colon polyp was 2004. Other colonoscopies without polyps. Likelihood of developing colon cancer small and risks of procedure due not outweight the benefits.   We could offer colonoscopy in the future if she is having any problems.   Recommend OV in 4 months with RMR only for follow up.

## 2015-11-29 NOTE — Telephone Encounter (Signed)
Pt is aware.  Please nic

## 2015-11-30 DIAGNOSIS — E119 Type 2 diabetes mellitus without complications: Secondary | ICD-10-CM | POA: Diagnosis not present

## 2015-12-01 ENCOUNTER — Telehealth: Payer: Self-pay | Admitting: Family Medicine

## 2015-12-01 ENCOUNTER — Ambulatory Visit (INDEPENDENT_AMBULATORY_CARE_PROVIDER_SITE_OTHER): Payer: Medicare Other | Admitting: *Deleted

## 2015-12-01 DIAGNOSIS — Z23 Encounter for immunization: Secondary | ICD-10-CM | POA: Diagnosis not present

## 2015-12-01 DIAGNOSIS — E785 Hyperlipidemia, unspecified: Secondary | ICD-10-CM

## 2015-12-01 DIAGNOSIS — E119 Type 2 diabetes mellitus without complications: Secondary | ICD-10-CM

## 2015-12-01 DIAGNOSIS — I10 Essential (primary) hypertension: Secondary | ICD-10-CM

## 2015-12-01 NOTE — Telephone Encounter (Signed)
Requesting order for labwork. °

## 2015-12-06 NOTE — Telephone Encounter (Signed)
Lip liv m7 A1c  

## 2015-12-06 NOTE — Telephone Encounter (Signed)
Left message return call 12/06/15

## 2015-12-11 ENCOUNTER — Other Ambulatory Visit: Payer: Self-pay | Admitting: Family Medicine

## 2015-12-11 ENCOUNTER — Encounter: Payer: Self-pay | Admitting: Gastroenterology

## 2015-12-12 NOTE — Telephone Encounter (Signed)
Blood work ordered in EPIC. Patient notified. 

## 2015-12-13 ENCOUNTER — Ambulatory Visit (INDEPENDENT_AMBULATORY_CARE_PROVIDER_SITE_OTHER): Payer: Medicare Other | Admitting: Urology

## 2015-12-13 DIAGNOSIS — N3011 Interstitial cystitis (chronic) with hematuria: Secondary | ICD-10-CM

## 2015-12-13 DIAGNOSIS — N3020 Other chronic cystitis without hematuria: Secondary | ICD-10-CM

## 2015-12-14 DIAGNOSIS — H353221 Exudative age-related macular degeneration, left eye, with active choroidal neovascularization: Secondary | ICD-10-CM | POA: Diagnosis not present

## 2015-12-18 DIAGNOSIS — E119 Type 2 diabetes mellitus without complications: Secondary | ICD-10-CM | POA: Diagnosis not present

## 2015-12-18 DIAGNOSIS — I10 Essential (primary) hypertension: Secondary | ICD-10-CM | POA: Diagnosis not present

## 2015-12-18 DIAGNOSIS — E785 Hyperlipidemia, unspecified: Secondary | ICD-10-CM | POA: Diagnosis not present

## 2015-12-19 LAB — HEPATIC FUNCTION PANEL
ALK PHOS: 83 IU/L (ref 39–117)
ALT: 17 IU/L (ref 0–32)
AST: 15 IU/L (ref 0–40)
Albumin: 4.8 g/dL — ABNORMAL HIGH (ref 3.5–4.7)
BILIRUBIN, DIRECT: 0.11 mg/dL (ref 0.00–0.40)
Bilirubin Total: 0.4 mg/dL (ref 0.0–1.2)
Total Protein: 7.4 g/dL (ref 6.0–8.5)

## 2015-12-19 LAB — BASIC METABOLIC PANEL
BUN / CREAT RATIO: 21 (ref 12–28)
BUN: 19 mg/dL (ref 8–27)
CHLORIDE: 98 mmol/L (ref 96–106)
CO2: 26 mmol/L (ref 18–29)
Calcium: 10 mg/dL (ref 8.7–10.3)
Creatinine, Ser: 0.9 mg/dL (ref 0.57–1.00)
GFR calc Af Amer: 70 mL/min/{1.73_m2} (ref >59)
GFR calc non Af Amer: 61 mL/min/{1.73_m2} (ref >59)
GLUCOSE: 155 mg/dL — AB (ref 65–99)
Potassium: 5.4 mmol/L — ABNORMAL HIGH (ref 3.5–5.2)
SODIUM: 144 mmol/L (ref 134–144)

## 2015-12-19 LAB — LIPID PANEL
CHOL/HDL RATIO: 4 ratio (ref 0.0–4.4)
Cholesterol, Total: 193 mg/dL (ref 100–199)
HDL: 48 mg/dL (ref >39)
LDL Calculated: 102 mg/dL — ABNORMAL HIGH (ref 0–99)
TRIGLYCERIDES: 213 mg/dL — AB (ref 0–149)
VLDL CHOLESTEROL CAL: 43 mg/dL — AB (ref 5–40)

## 2015-12-19 LAB — HEMOGLOBIN A1C
Est. average glucose Bld gHb Est-mCnc: 146 mg/dL
HEMOGLOBIN A1C: 6.7 % — AB (ref 4.8–5.6)

## 2015-12-25 ENCOUNTER — Encounter: Payer: Medicare Other | Admitting: Nurse Practitioner

## 2015-12-27 ENCOUNTER — Ambulatory Visit (INDEPENDENT_AMBULATORY_CARE_PROVIDER_SITE_OTHER): Payer: Medicare Other | Admitting: Nurse Practitioner

## 2015-12-27 ENCOUNTER — Encounter: Payer: Self-pay | Admitting: Nurse Practitioner

## 2015-12-27 VITALS — BP 140/78 | Ht 63.5 in | Wt 166.5 lb

## 2015-12-27 DIAGNOSIS — Z0000 Encounter for general adult medical examination without abnormal findings: Secondary | ICD-10-CM | POA: Diagnosis not present

## 2015-12-27 DIAGNOSIS — Z780 Asymptomatic menopausal state: Secondary | ICD-10-CM | POA: Diagnosis not present

## 2015-12-28 ENCOUNTER — Encounter: Payer: Self-pay | Admitting: Nurse Practitioner

## 2015-12-28 NOTE — Progress Notes (Signed)
   Subjective:    Patient ID: Deborah Gray, female    DOB: 12-13-1933, 82 y.o.   MRN: [id number]  HPI presents for her wellness exam. Same sexual partner. No pelvic pain. Regular vision and dental exams. Sees Dr. Sydell Axon; will discuss colonoscopy with him. Active; rides bike 20 minutes 5-6 days per week. Also regular follow up with urology.     Review of Systems  Constitutional: Negative for activity change, appetite change and fatigue.  HENT: Negative for dental problem, ear pain, sinus pressure and sore throat.   Respiratory: Negative for cough, chest tightness, shortness of breath and wheezing.   Cardiovascular: Negative for chest pain.  Gastrointestinal: Negative for abdominal distention, abdominal pain, constipation, diarrhea, nausea and vomiting.  Genitourinary: Negative for difficulty urinating, dysuria, enuresis, frequency, genital sores, pelvic pain, urgency and vaginal discharge.       Objective:   Physical Exam  Constitutional: She is oriented to person, place, and time. She appears well-developed. No distress.  HENT:  Right Ear: External ear normal.  Left Ear: External ear normal.  Mouth/Throat: Oropharynx is clear and moist.  Neck: Normal range of motion. Neck supple. No tracheal deviation present. No thyromegaly present.  Cardiovascular: Normal rate, regular rhythm and normal heart sounds.  Exam reveals no gallop.   No murmur heard. Pulmonary/Chest: Effort normal and breath sounds normal.  Abdominal: Soft. She exhibits no distension. There is no tenderness.  Genitourinary: Vagina normal. No vaginal discharge found.  Genitourinary Comments: External GU: no rashes or lesions. Vagina: pale, no discharge. Bimanual exam: no tenderness or obvious masses. No rectal exam today due to hemorrhoids.   Musculoskeletal: She exhibits no edema.  Lymphadenopathy:    She has no cervical adenopathy.  Neurological: She is alert and oriented to person, place, and time.  Skin: Skin is  warm and dry. No rash noted.  Psychiatric: She has a normal mood and affect. Her behavior is normal.  Vitals reviewed.         Assessment & Plan:  Routine general medical examination at a health care facility  Post-menopausal - Plan: DG Bone Density  Recommend daily vitamin D/calcium. Continue follow up with specialists.  Return in about 6 months (around 06/25/2016) for routine follow up.

## 2016-01-05 ENCOUNTER — Ambulatory Visit (INDEPENDENT_AMBULATORY_CARE_PROVIDER_SITE_OTHER): Payer: Medicare Other | Admitting: Urology

## 2016-01-05 ENCOUNTER — Other Ambulatory Visit (HOSPITAL_COMMUNITY)
Admission: RE | Admit: 2016-01-05 | Discharge: 2016-01-05 | Disposition: A | Discharge status: Home / Self Care | Payer: Medicare Other | Source: Other Acute Inpatient Hospital | Attending: Urology | Admitting: Urology

## 2016-01-05 DIAGNOSIS — N3020 Other chronic cystitis without hematuria: Secondary | ICD-10-CM

## 2016-01-05 DIAGNOSIS — N3010 Interstitial cystitis (chronic) without hematuria: Secondary | ICD-10-CM | POA: Diagnosis not present

## 2016-01-08 ENCOUNTER — Ambulatory Visit (HOSPITAL_COMMUNITY)
Admission: RE | Admit: 2016-01-08 | Discharge: 2016-01-08 | Disposition: A | Discharge status: Home / Self Care | Payer: Medicare Other | Source: Ambulatory Visit | Attending: Nurse Practitioner | Admitting: Nurse Practitioner

## 2016-01-08 DIAGNOSIS — Z13820 Encounter for screening for osteoporosis: Secondary | ICD-10-CM | POA: Diagnosis not present

## 2016-01-08 DIAGNOSIS — Z780 Asymptomatic menopausal state: Secondary | ICD-10-CM | POA: Insufficient documentation

## 2016-01-08 DIAGNOSIS — M85831 Other specified disorders of bone density and structure, right forearm: Secondary | ICD-10-CM | POA: Insufficient documentation

## 2016-01-08 DIAGNOSIS — M85852 Other specified disorders of bone density and structure, left thigh: Secondary | ICD-10-CM | POA: Insufficient documentation

## 2016-01-08 DIAGNOSIS — M8589 Other specified disorders of bone density and structure, multiple sites: Secondary | ICD-10-CM | POA: Diagnosis not present

## 2016-01-08 LAB — URINE CULTURE: Culture: >=100000 — AB

## 2016-01-16 ENCOUNTER — Ambulatory Visit (INDEPENDENT_AMBULATORY_CARE_PROVIDER_SITE_OTHER): Payer: Medicare Other | Admitting: Urology

## 2016-01-16 DIAGNOSIS — N3020 Other chronic cystitis without hematuria: Secondary | ICD-10-CM

## 2016-01-16 DIAGNOSIS — N3010 Interstitial cystitis (chronic) without hematuria: Secondary | ICD-10-CM | POA: Diagnosis not present

## 2016-01-17 DIAGNOSIS — E119 Type 2 diabetes mellitus without complications: Secondary | ICD-10-CM | POA: Diagnosis not present

## 2016-01-19 ENCOUNTER — Encounter: Payer: Self-pay | Admitting: Nurse Practitioner

## 2016-01-22 ENCOUNTER — Ambulatory Visit (INDEPENDENT_AMBULATORY_CARE_PROVIDER_SITE_OTHER): Payer: Medicare Other | Admitting: Gastroenterology

## 2016-01-22 ENCOUNTER — Encounter: Payer: Self-pay | Admitting: Gastroenterology

## 2016-01-22 VITALS — BP 112/70 | HR 76 | Temp 98.1°F | Ht 64.0 in | Wt 166.2 lb

## 2016-01-22 DIAGNOSIS — K861 Other chronic pancreatitis: Secondary | ICD-10-CM | POA: Diagnosis not present

## 2016-01-22 DIAGNOSIS — R140 Abdominal distension (gaseous): Secondary | ICD-10-CM | POA: Diagnosis not present

## 2016-01-22 DIAGNOSIS — R1010 Upper abdominal pain, unspecified: Secondary | ICD-10-CM | POA: Diagnosis not present

## 2016-01-22 NOTE — Patient Instructions (Signed)
1. Increase Creon to 3 capsules with meals, 2 with snacks.  2. Please have your labs done. We will contact you with results as available. 3. I will discuss your case with Dr. Gala Romney. Further recommendations to follow.

## 2016-01-22 NOTE — Progress Notes (Signed)
Primary Care Physician:  Mickie Hillier, MD  Primary Gastroenterologist:  Garfield Cornea, MD   Chief Complaint  Patient presents with  . Gastroesophageal Reflux  . Bloated    causes shortness of breath  . Hemorrhoids    no bleeding    HPI:  [place] is a 82 y.o. female here for follow up of bloating, gerd. Last seen in 10/2015. H/o multiple GI issues including nutcracker esophagus, remote esophageal strictures, esophageal spasms, improved on imipramine, idiopathic chronic pancreatitis, IBS. Also with frequent UTIs, interstitial cystitis. EGD with reactive gastropathy earlier this year.HBT 08/2015 negative.   States she was hospitalized in June for chest pain. She ruled out for ACS. Stress test normal.   She complains of worsening pressure on diaphragm. Full in upper abd with bloating that causes SOB. Discomfort. Uses heating pad. Stomach will eventually start "gurgling" and then she gets relief. Feels like blockage. SOB coming up basement steps. No heartburn. Cannot tolerate anything tight across abdomen. Soreness in stomach. BM, no diarrhea. No melena, brbpr.    EGD 04/2015 reactive gastropathy.      Current Outpatient Prescriptions  Medication Sig Dispense Refill  . acetaminophen (TYLENOL) 500 MG tablet Take 500 mg by mouth at bedtime.    Marland Kitchen aspirin 81 MG tablet Take 81 mg by mouth at bedtime.      Marland Kitchen azelastine (ASTELIN) 0.1 % nasal spray Place 1 spray into both nostrils as needed. Use in each nostril as directed     . benzonatate (TESSALON) 100 MG capsule Take 1 capsule (100 mg total) by mouth 3 (three) times daily as needed for cough. 45 capsule 3  . cephALEXin (KEFLEX) 500 MG capsule Take 500 mg by mouth at bedtime.  6  . ezetimibe (ZETIA) 10 MG tablet TAKE 1 TABLET (10 MG TOTAL) BY MOUTH DAILY. 90 tablet 2  . fexofenadine (ALLEGRA) 180 MG tablet Take 180 mg by mouth every other day. Reported on 08/23/2015    . imipramine (TOFRANIL) 10 MG tablet Take 3 tablets (30 mg total) by  mouth at bedtime. (Patient taking differently: Take 10 mg by mouth at bedtime. ) 90 tablet 5  . Ketotifen Fumarate (THERA TEARS ALLERGY OP) Apply 1 drop to eye 2 (two) times daily.     . meclizine (ANTIVERT) 25 MG tablet TAKE 1 TABLET (25 MG TOTAL) BY MOUTH 3 (THREE) TIMES DAILY AS NEEDED FOR DIZZINESS OR NAUSEA. 30 tablet 1  . metFORMIN (GLUCOPHAGE) 500 MG tablet TAKE 1 TABLET (500 MG TOTAL) BY MOUTH 2 (TWO) TIMES DAILY. 180 tablet 0  . mirabegron ER (MYRBETRIQ) 25 MG TB24 tablet Take 25 mg by mouth daily.    . Multiple Vitamins-Minerals (PRESERVISION AREDS 2) CAPS Take 1 capsule by mouth 2 (two) times daily. Reported on 08/23/2015    . nateglinide (STARLIX) 120 MG tablet TAKE 1 TABLET BY MOUTH 3 TIMES A DAY BEFORE MEALS DUE 4-3 270 tablet 0  . nateglinide (STARLIX) 120 MG tablet TAKE 1 TABLET BY MOUTH 3 TIMES A DAY BEFORE MEALS DUE 4-3 270 tablet 1  . Omega-3 Fatty Acids (FISH OIL) 1200 MG CAPS Take 4 capsules by mouth daily.     Marland Kitchen omeprazole (PRILOSEC) 20 MG capsule Take 1 capsule (20 mg total) by mouth daily before breakfast. 90 capsule 3  . Pancrelipase, Lip-Prot-Amyl, (CREON) 24000 units CPEP Take one capsule 3 times a day with meals and one capsule with snacks. 450 capsule 3  . Probiotic Product (ALIGN) 4 MG CAPS Take 1 capsule  by mouth daily. Reported on 06/06/2015    . valsartan (DIOVAN) 80 MG tablet TAKE 1 TABLET (80 MG TOTAL) BY MOUTH DAILY. 90 tablet 1  . VITAMIN E PO Take 1 capsule by mouth 4 (four) times a week. Four times per week    . Vitamin Mixture (ESTER-C) 500-60 MG TABS Take 1 tablet by mouth 4 (four) times a week. 1 tab three times weekly     . Wheat Dextrin (BENEFIBER PO) Take 1 packet by mouth daily.     Marland Kitchen trimethoprim (TRIMPEX) 100 MG tablet Take 100 mg by mouth at bedtime.  11   No current facility-administered medications for this visit.     Allergies as of 01/22/2016 - Review Complete 01/22/2016  Allergen Reaction Noted  . Estrogens Other (See Comments) 10/15/2012  .  Other  09/01/2013  . Sulfonamide derivatives Rash 03/01/2008    Past Medical History:  Diagnosis Date  . Arthritis   . Benign neoplasm of colon   . Constipation   . Diaphragmatic hernia without mention of obstruction or gangrene   . Diverticulosis of colon (without mention of hemorrhage)   . Dysphagia, pharyngoesophageal phase   . Esophageal reflux   . Essential hypertension   . Heart murmur   . Hemorrhoids   . Hyperlipidemia   . Internal hemorrhoids without mention of complication   . PONV (postoperative nausea and vomiting)   . Stricture and stenosis of esophagus   . Type 2 diabetes mellitus ([place])   . Unspecified gastritis and gastroduodenitis without mention of hemorrhage     Past Surgical History:  Procedure Laterality Date  . ABDOMINAL HYSTERECTOMY    . BACTERIAL OVERGROWTH TEST N/A 09/06/2015   Procedure: BACTERIAL OVERGROWTH TEST;  Surgeon: Daneil Dolin, MD;  Location: [facility];  Service: Endoscopy;  Laterality: N/A;  800  . BREAST LUMPECTOMY Right    benign  . CATARACT EXTRACTION Bilateral 2006   Implants in both, surgeries done 6 weeks apart  . CHOLECYSTECTOMY    . COLONOSCOPY  03/08/02   [name]: diverticulosis, internal hemorrhoids, adenomatous colon polyp  . COLONOSCOPY  01/23/04   [name]: diverticulosis, internal hemorrhoids  . COLONOSCOPY  09/25/05   [name]: diverticulosis  . COLONOSCOPY  07/05/09   [name]: severe diverticulosis  . COLONOSCOPY  01/21/11   [name]: severe diverticulosis in sigmoid to desc colon, int hemorrhoids, follow up TCS in 5 years  . DILATION AND CURETTAGE OF UTERUS    . ESOPHAGEAL MANOMETRY  03/21/08   [place]: findings c/w Nutcracker Esophagus  . ESOPHAGOGASTRODUODENOSCOPY  03/08/02   [name]: esophageal stricture, chronic gerd, s/p dilation.   . ESOPHAGOGASTRODUODENOSCOPY  01/23/04   [name]: esophageal stricture, gastritis, hiatal hernia  . ESOPHAGOGASTRODUODENOSCOPY  09/25/05   [facility]terson: gastritis, benign bx,  no H.pylori  . ESOPHAGOGASTRODUODENOSCOPY  07/05/09   [facility]terson: gastropathy, benign small bowel bx and gastric bx  . ESOPHAGOGASTRODUODENOSCOPY N/A 05/15/2015   Dr. Gala Romney- diffuse moderate inflammation characterized by congestion (edema), erythema, and linear erosions was found in the entire examined stomach. bx= reactive gastropathy  . FOOT SURGERY Left   . HEMORRHOID BANDING  2017   Dr.[name]  . LAPAROSCOPIC VAGINAL HYSTERECTOMY    . RECTOCELE REPAIR    . TOTAL KNEE ARTHROPLASTY Right     Family History  Problem Relation Age of Onset  . Ovarian cancer Mother   . Breast cancer Sister   . Liver cancer Sister   . Colon cancer Cousin     Paternal side  . Diabetes  Maternal Aunt   . Diabetes Father   . Heart disease Father   . Liver disease Cousin     Maternal side, never drank    Social History   Social History  . Marital status: Married    Spouse name: N/A  . Number of children: 3  . Years of education: N/A   Occupational History  . Retired    Social History Main Topics  . Smoking status: Never Smoker  . Smokeless tobacco: Never Used     Comment: Never smoked  . Alcohol use No  . Drug use: No  . Sexual activity: Not on file   Other Topics Concern  . Not on file   Social History Narrative  . No narrative on file      ROS:  General: Negative for anorexia, weight loss, fever, chills, fatigue, weakness. Eyes: Negative for vision changes.  ENT: Negative for hoarseness, difficulty swallowing , nasal congestion. CV: Negative for chest pain, angina, palpitations, dyspnea on exertion, peripheral edema. See hpi Respiratory: Negative for dyspnea at rest, dyspnea on exertion, cough, sputum, wheezing.  GI: See history of present illness. GU:  Negative for dysuria, hematuria, urinary incontinence, urinary frequency, nocturnal urination.  MS: Negative for joint pain, low back pain.  Derm: Negative for rash or itching.  Neuro: Negative for weakness, abnormal sensation,  seizure, frequent headaches, memory loss, confusion.  Psych: Negative for anxiety, depression, suicidal ideation, hallucinations.  Endo: Negative for unusual weight change.  Heme: Negative for bruising or bleeding. Allergy: Negative for rash or hives.    Physical Examination:  BP 112/70   Pulse 76   Temp 98.1 F (36.7 C) (Oral)   Ht 5\' 4"  (1.626 m)   Wt 166 lb 3.2 oz (75.4 kg)   BMI 28.53 kg/m    General: Well-nourished, well-developed in no acute distress.  Head: Normocephalic, atraumatic.   Eyes: Conjunctiva pink, no icterus. Mouth: Oropharyngeal mucosa moist and pink , no lesions erythema or exudate. Neck: Supple without thyromegaly, masses, or lymphadenopathy.  Lungs: Clear to auscultation bilaterally.  Heart: Regular rate and rhythm, no murmurs rubs or gallops.  Abdomen: Bowel sounds are normal, mild epig tenderness, nondistended, no hepatosplenomegaly or masses, no abdominal bruits or    hernia , no rebound or guarding.   Rectal: not performed Extremities: No lower extremity edema. No clubbing or deformities.  Neuro: Alert and oriented x 4 , grossly normal neurologically.  Skin: Warm and dry, no rash or jaundice.   Psych: Alert and cooperative, normal mood and affect.  Labs: Lab Results  Component Value Date   WBC 7.4 01/22/2016   HGB 13.6 08/16/2015   HCT 40.9 01/22/2016   MCV 88 01/22/2016   PLT 248 01/22/2016   Lab Results  Component Value Date   ALT 17 12/18/2015   AST 15 12/18/2015   ALKPHOS 83 12/18/2015   BILITOT 0.4 12/18/2015   Lab Results  Component Value Date   CREATININE 0.90 12/18/2015   BUN 19 12/18/2015   NA 144 12/18/2015   K 5.4 (H) 12/18/2015   CL 98 12/18/2015   CO2 26 12/18/2015     Imaging Studies: Dg Bone Density  Result Date: 01/08/2016 EXAM: DUAL X-RAY ABSORPTIOMETRY (DXA) FOR BONE MINERAL DENSITY IMPRESSION: Ordering Physician:  Dr. Nilda Simmer, Your patient Deborah Gray completed a BMD test on 01/08/2016 using  the [place] (software version: 14.10) manufactured by [employer]. The following summarizes the results of our evaluation. PATIENT  BIOGRAPHICAL: Name: ZULEMA, CAROTHERS Patient ID: [id number] Birth Date: [birth year] Height: 63.0 in. Gender: Female Exam Date: 01/08/2016 Weight: 166.0 lbs. Indications: Advanced Age, Caucasian, Height Loss, Low Calcium Intake, Partial Hysterectomy, Post Menopausal Fractures: Treatments: Asprin DENSITOMETRY RESULTS: Site          Region     Measured Date Measured Age WHO Classification Young Adult T-score BMD         %Change vs. Previous Significant Change (*) DualFemur Neck Left 01/08/2016 80.3 Osteopenia -1.8 0.793 g/cm2 -10.1% Yes DualFemur Neck Left 12/09/2002 67.2 Osteopenia -1.1 0.882 g/cm2 - - Right Forearm Radius 33% 01/08/2016 80.3 Osteopenia -1.5 0.605 g/cm2 -7.4% Yes Right Forearm Radius 33% [birth year]/09/2002 67.2 Normal -0.9 0.653 g/cm2 - - ASSESSMENT: BMD as determined from Femur Neck Left is 0.793 g/cm2 with a T-Score of -1.8. This patient is considered OSTEOPENIC according to [facility]) criteria. Compared with the prior study on 12/09/2002, the BMD of the left femoral neck and right forearm show a statistically significant decrease. (Lumbar spine was not utilized due to advanced degenerative changes.) World Pharmacologist (WHO) criteria for post-menopausal, Caucasian Women: Normal:       T-score at or above -1 SD Osteopenia:   T-score between -1 and -2.5 SD Osteoporosis: T-score at or below -2.5 SD RECOMMENDATIONS: [place] recommends that FDA-approved medial therapies be considered in postmenopausal women and men age 77 or older with a: 1. Hip or vertebral (clinical or morphometric) fracture. 2. T-Score of < -2.5 at the spine or hip. 3. Ten-year fracture probability by FRAX of 3% or greater for hip fracture or 20% or greater for major osteoporotic fracture. All treatment decisions require clinical  judgment and consideration of individual patient factors, including patient preferences, co-morbidities, previous drug use, risk factors not captured in the FRAX model (e.g. falls, vitamin D deficiency, increased bone turnover, interval significant decline in bone density) and possible under-or over-estimation of fracture risk by FRAX. All patients should ensure an adequate intake of dietary calcium (1200 mg/d) and vitamin D (800 IU daily) unless contraindicated. FOLLOW-UP: People with diagnosed cases of osteoporosis or osteopenia should be regularly tested for bone mineral density. For patients eligible for Medicare, routine testing is allowed once every 2 years. Testing frequency can be increased for patients who have rapidly progressing disease, or for those who are receiving medical therapy to restore bone mass. I have reviewed this report, and agree with the above findings. Mark A. Thornton Papas, M.D. [facility] Dear Dr. Nilda Simmer, Your patient AIMIE MASIN completed a FRAX assessment on 01/08/2016 using the [place] (analysis version: 14.10) manufactured by [employer]. The following summarizes the results of our evaluation. PATIENT BIOGRAPHICAL: Name: IOLANDA, LENNERT Patient ID: [id number] Birth Date: 04/06/1933 Height:    63.0 in. Gender:     Female    [age]       Weight:    166.0 lbs. Ethnicity:  White                            Exam Date: 01/08/2016 FRAX* RESULTS:  (version: 3.5) 10-year Probability of Fracture1 Major Osteoporotic Fracture2 Hip Fracture 14.4% 3.8% Population: [place] (Caucasian) Risk Factors: None Based on Femur (Left) Neck BMD 1 -The 10-year probability of fracture may be lower than reported if the patient has received treatment. 2 -Major Osteoporotic Fracture: Clinical Spine, Forearm, Hip or Shoulder *  FRAX is a Materials engineer of the [employer] for Metabolic Bone Disease, a [place] (WHO)  [facility]. ASSESSMENT: The probability of a major osteoporotic fracture is 14.4% within the next ten years. The probability of a hip fracture is 3.8% within the next ten years. I have reviewed this report and agree with the above findings. Mark A. Thornton Papas, M.D. [facility] Electronically Signed   By: Lavonia Dana M.D.   On: 01/08/2016 16:22

## 2016-01-23 LAB — LIPASE: LIPASE: 13 U/L — AB (ref 14–85)

## 2016-01-23 LAB — CBC WITH DIFFERENTIAL/PLATELET
BASOS ABS: 0 10*3/uL (ref 0.0–0.2)
Basos: 0 %
EOS (ABSOLUTE): 0.2 10*3/uL (ref 0.0–0.4)
EOS: 2 %
HEMATOCRIT: 40.9 % (ref 34.0–46.6)
Hemoglobin: 13.6 g/dL (ref 11.1–15.9)
Immature Grans (Abs): 0 10*3/uL (ref 0.0–0.1)
Immature Granulocytes: 0 %
LYMPHS ABS: 2.4 10*3/uL (ref 0.7–3.1)
Lymphs: 32 %
MCH: 29.2 pg (ref 26.6–33.0)
MCHC: 33.3 g/dL (ref 31.5–35.7)
MCV: 88 fL (ref 79–97)
MONOS ABS: 0.6 10*3/uL (ref 0.1–0.9)
Monocytes: 9 %
Neutrophils Absolute: 4.2 10*3/uL (ref 1.4–7.0)
Neutrophils: 57 %
PLATELETS: 248 10*3/uL (ref 150–379)
RBC: 4.65 x10E6/uL (ref 3.77–5.28)
RDW: 14.5 % (ref 12.3–15.4)
WBC: 7.4 10*3/uL (ref 3.4–10.8)

## 2016-01-24 ENCOUNTER — Encounter: Payer: Self-pay | Admitting: Gastroenterology

## 2016-01-24 NOTE — Assessment & Plan Note (Signed)
82 y/o female with multiple chronic GI issues that presents with ongoing epigastric discomfort associated with bloating and sensation of cutting off breath. Cardiac work up recently unremarkable. She has had extensive evaluation. EGD earlier this year. CT one year ago. Sensation of food not passing freely, develops bloating with meals, then "gurgling" in upper abdomen followed by relief of bloating and abdominal discomfort.   Will increase pancreatic enzymes to optimize therapy. Labs. To discuss with Dr. Gala Romney, consider colonoscopy for further evaluation of symptoms and remote h/o adenomatous colon polyps.

## 2016-01-25 NOTE — Progress Notes (Signed)
Cc'ed to pcp °

## 2016-01-30 ENCOUNTER — Ambulatory Visit (INDEPENDENT_AMBULATORY_CARE_PROVIDER_SITE_OTHER): Payer: Medicare Other | Admitting: Urology

## 2016-01-30 ENCOUNTER — Other Ambulatory Visit (HOSPITAL_COMMUNITY)
Admission: RE | Admit: 2016-01-30 | Discharge: 2016-01-30 | Disposition: A | Discharge status: Home / Self Care | Payer: Medicare Other | Source: Ambulatory Visit | Attending: Urology | Admitting: Urology

## 2016-01-30 DIAGNOSIS — N3000 Acute cystitis without hematuria: Secondary | ICD-10-CM | POA: Diagnosis not present

## 2016-01-31 DIAGNOSIS — H353221 Exudative age-related macular degeneration, left eye, with active choroidal neovascularization: Secondary | ICD-10-CM | POA: Diagnosis not present

## 2016-02-02 LAB — URINE CULTURE

## 2016-02-07 ENCOUNTER — Telehealth: Payer: Self-pay | Admitting: Gastroenterology

## 2016-02-07 NOTE — Telephone Encounter (Signed)
Please let patient know her labs were normal.   Would offer colonoscopy for h/o adenomatous colon polyps, last tcs five years ago.   Day of bowel prep: 1/2 dose starlix, glucophage.

## 2016-02-08 NOTE — Telephone Encounter (Signed)
Pt called office and was informed of LSL's recommendation. Pt is agreeable to proceed with colonoscopy. OV scheduled for 03/11/16 at 11:00 am (no charge).  [name], pt wanted you to know she if feeling some better since pancreas med was increased. Said she has occasional bloating and shortness of breath, but overall she is better.

## 2016-02-08 NOTE — Telephone Encounter (Signed)
Is she going to need another ov if we cannot get her in within her 30 days?

## 2016-02-08 NOTE — Telephone Encounter (Signed)
Tried to call pt- NA-LMOM asking her to call the office.

## 2016-02-08 NOTE — Telephone Encounter (Signed)
If she cannot be done within 30 days, I will ask RMR, if he advises OV, I will see her at NO CHARGE. It was my fault for delay.

## 2016-02-09 NOTE — Telephone Encounter (Signed)
Noted  

## 2016-02-14 ENCOUNTER — Other Ambulatory Visit (HOSPITAL_COMMUNITY)
Admission: RE | Admit: 2016-02-14 | Discharge: 2016-02-14 | Disposition: A | Discharge status: Home / Self Care | Payer: Medicare Other | Source: Ambulatory Visit | Attending: Urology | Admitting: Urology

## 2016-02-14 ENCOUNTER — Ambulatory Visit (INDEPENDENT_AMBULATORY_CARE_PROVIDER_SITE_OTHER): Payer: Medicare Other | Admitting: Urology

## 2016-02-14 ENCOUNTER — Other Ambulatory Visit: Payer: Self-pay | Admitting: Family Medicine

## 2016-02-14 DIAGNOSIS — N3001 Acute cystitis with hematuria: Secondary | ICD-10-CM | POA: Diagnosis not present

## 2016-02-14 DIAGNOSIS — N3000 Acute cystitis without hematuria: Secondary | ICD-10-CM

## 2016-02-17 LAB — URINE CULTURE: Culture: >=100000 — AB

## 2016-02-23 ENCOUNTER — Ambulatory Visit (INDEPENDENT_AMBULATORY_CARE_PROVIDER_SITE_OTHER): Payer: Medicare Other | Admitting: Urology

## 2016-02-23 DIAGNOSIS — N3020 Other chronic cystitis without hematuria: Secondary | ICD-10-CM

## 2016-02-23 DIAGNOSIS — N3010 Interstitial cystitis (chronic) without hematuria: Secondary | ICD-10-CM | POA: Diagnosis not present

## 2016-02-25 ENCOUNTER — Emergency Department (HOSPITAL_COMMUNITY)
Admission: EM | Admit: 2016-02-25 | Discharge: 2016-02-25 | Disposition: A | Discharge status: Home / Self Care | Payer: Medicare Other | Attending: Emergency Medicine | Admitting: Emergency Medicine

## 2016-02-25 ENCOUNTER — Encounter (HOSPITAL_COMMUNITY): Payer: Self-pay | Admitting: Emergency Medicine

## 2016-02-25 ENCOUNTER — Emergency Department (HOSPITAL_COMMUNITY): Payer: Medicare Other

## 2016-02-25 DIAGNOSIS — J101 Influenza due to other identified influenza virus with other respiratory manifestations: Secondary | ICD-10-CM

## 2016-02-25 DIAGNOSIS — Z7982 Long term (current) use of aspirin: Secondary | ICD-10-CM | POA: Diagnosis not present

## 2016-02-25 DIAGNOSIS — J09X2 Influenza due to identified novel influenza A virus with other respiratory manifestations: Secondary | ICD-10-CM | POA: Insufficient documentation

## 2016-02-25 DIAGNOSIS — I10 Essential (primary) hypertension: Secondary | ICD-10-CM | POA: Diagnosis not present

## 2016-02-25 DIAGNOSIS — E119 Type 2 diabetes mellitus without complications: Secondary | ICD-10-CM | POA: Insufficient documentation

## 2016-02-25 DIAGNOSIS — R05 Cough: Secondary | ICD-10-CM | POA: Diagnosis not present

## 2016-02-25 DIAGNOSIS — Z7984 Long term (current) use of oral hypoglycemic drugs: Secondary | ICD-10-CM | POA: Diagnosis not present

## 2016-02-25 DIAGNOSIS — Z79899 Other long term (current) drug therapy: Secondary | ICD-10-CM | POA: Diagnosis not present

## 2016-02-25 LAB — INFLUENZA PANEL BY PCR (TYPE A & B)
INFLBPCR: NEGATIVE
Influenza A By PCR: POSITIVE — AB

## 2016-02-25 MED ORDER — OSELTAMIVIR PHOSPHATE 75 MG PO CAPS: 75.0000 mg | ORAL_CAPSULE | Freq: Two times a day (BID) | ORAL | 0 refills | Status: AC

## 2016-02-25 NOTE — ED Provider Notes (Signed)
[facility] DEPT Provider Note   CSN: [id number] Arrival date & time: 02/25/16  [id number]   By signing my name below, I, Deborah Gray, attest that this documentation has been prepared under the direction and in the presence of Deborah Blank, MD. Electronically Signed: Hilbert Gray, Scribe. 02/25/16. 12:09 PM. History   Chief Complaint Chief Complaint  Patient presents with  . Cough    The history is provided by the patient. No language interpreter was used.   HPI Comments: Deborah Gray is a 83 y.o. female who presents to the Emergency Department complaining of a progressively worsened nonproductive cough since yesterday. She reports associated fever and body aches. She reports taking tylenol last night with no significant relief. She also reports rhinorrhea. She denies headache. Patient also reports sharp pain radiating down her left thigh. She denies swelling in legs. She reports hx of diabetes. She denies hx of COPD and smoking.  Past Medical History:  Diagnosis Date  . Arthritis   . Benign neoplasm of colon   . Constipation   . Diaphragmatic hernia without mention of obstruction or gangrene   . Diverticulosis of colon (without mention of hemorrhage)   . Dysphagia, pharyngoesophageal phase   . Esophageal reflux   . Essential hypertension   . Heart murmur   . Hemorrhoids   . Hyperlipidemia   . Internal hemorrhoids without mention of complication   . PONV (postoperative nausea and vomiting)   . Stricture and stenosis of esophagus   . Type 2 diabetes mellitus ([place])   . Unspecified gastritis and gastroduodenitis without mention of hemorrhage     Patient Active Problem List   Diagnosis Date Noted  . Bloating 08/30/2015  . Exertional chest pain 08/16/2015  . Chest pain 08/16/2015  . Mucosal abnormality of stomach   . Hemorrhoids 04/18/2015  . IBS (irritable bowel syndrome) 01/30/2015  . Type 2 diabetes mellitus ([place]) 12/22/2014  . Hyperlipidemia  12/22/2014  . Prolapse of female pelvic organs 11/28/2013  . Precordial pain 11/25/2013  . Essential hypertension 11/25/2013  . Heart murmur 11/25/2013  . Osteopenia 10/23/2012  . Chronic pancreatitis ([place]) 02/26/2011  . GERD (gastroesophageal reflux disease) 01/18/2011  . Constipation 11/29/2010  . Esophageal spasm 11/29/2010  . DJD (degenerative joint disease) 11/29/2010  . ESOPHAGEAL MOTILITY DISORDER 04/01/2008  . DM 03/01/2008  . HEMORRHOIDS, INTERNAL 07/02/2007  . Diverticulosis of large intestine 07/02/2007    Past Surgical History:  Procedure Laterality Date  . ABDOMINAL HYSTERECTOMY    . BACTERIAL OVERGROWTH TEST N/A 09/06/2015   Procedure: BACTERIAL OVERGROWTH TEST;  Surgeon: Daneil Dolin, MD;  Location: [facility] SU[facility];  Service: Endoscopy;  Laterality: N/A;  800  . BREAST LUMPECTOMY Right    benign  . CATARACT EXTRACTION Bilateral 2006   Implants in both, surgeries done 6 weeks apart  . CHOLECYSTECTOMY    . COLONOSCOPY  03/08/02   [name]: diverticulosis, internal hemorrhoids, adenomatous colon polyp  . COLONOSCOPY  01/23/04   [name]: diverticulosis, internal hemorrhoids  . COLONOSCOPY  09/25/05   [name]: diverticulosis  . COLONOSCOPY  07/05/09   [name]: severe diverticulosis  . COLONOSCOPY  01/21/11   [name]: severe diverticulosis in sigmoid to desc colon, int hemorrhoids, follow up TCS in 5 years  . DILATION AND CURETTAGE OF UTERUS    . ESOPHAGEAL MANOMETRY  03/21/08   [facility]: findings c/w [place]acker Esophagus  . ESOPHAGOGASTRODUODENOSCOPY  03/08/02   [name]: esophageal stricture, chronic gerd, s/p dilation.   . ESOPHAGOGASTRODUODENOSCOPY  01/23/04  [facility]: esophageal stricture, gastritis, hiatal hernia  . ESOPHAGOGASTRODUODENOSCOPY  09/25/05   Patterson: gastritis, benign bx, no H.pylori  . ESOPHAGOGASTRODUODENOSCOPY  07/05/09   [facility]terson: gastropathy, benign small bowel bx and gastric bx  . ESOPHAGOGASTRODUODENOSCOPY N/A 05/15/2015   Dr.  Gala Romney- diffuse moderate inflammation characterized by congestion (edema), erythema, and linear erosions was found in the entire examined stomach. bx= reactive gastropathy  . FOOT SURGERY Left   . HEMORRHOID BANDING  2017   Dr.[name]  . LAPAROSCOPIC VAGINAL HYSTERECTOMY    . RECTOCELE REPAIR    . TOTAL KNEE ARTHROPLASTY Right     OB History    Gravida Para Term Preterm AB Living   4 3 3   1 3    SAB TAB Ectopic Multiple Live Births   1               Home Medications    Prior to Admission medications   Medication Sig Start Date End Date Taking? Authorizing Provider  acetaminophen (TYLENOL) 500 MG tablet Take 500 mg by mouth at bedtime.   Yes Historical Provider, MD  aspirin 81 MG tablet Take 81 mg by mouth at bedtime.     Yes Historical Provider, MD  azelastine (ASTELIN) 0.1 % nasal spray Place 1 spray into both nostrils as needed. Use in each nostril as directed    Yes Historical Provider, MD  benzonatate (TESSALON) 100 MG capsule Take 1 capsule (100 mg total) by mouth 3 (three) times daily as needed for cough. 09/25/15  Yes Kathyrn Drown, MD  cephALEXin (KEFLEX) 250 MG capsule Take 250 mg by mouth 2 (two) times daily. 02/14/16  Yes Historical Provider, MD  chlorpheniramine-HYDROcodone (TUSSIONEX PENNKINETIC ER) 10-8 MG/5ML SUER Take 5 mLs by mouth at bedtime as needed for cough.   Yes Historical Provider, MD  ezetimibe (ZETIA) 10 MG tablet TAKE 1 TABLET (10 MG TOTAL) BY MOUTH DAILY. 08/02/15  Yes [name], MD  guaiFENesin (MUCINEX) 600 MG 12 hr tablet Take 600 mg by mouth daily as needed.   Yes Historical Provider, MD  imipramine (TOFRANIL) 10 MG tablet Take 3 tablets (30 mg total) by mouth at bedtime. Patient taking differently: Take 10 mg by mouth at bedtime.  06/27/15  Yes Mahala Menghini, PA-C  Ketotifen Fumarate (THERA TEARS ALLERGY OP) Apply 1 drop to eye 2 (two) times daily.    Yes Historical Provider, MD  metFORMIN (GLUCOPHAGE) 500 MG tablet TAKE 1 TABLET (500 MG TOTAL) BY  MOUTH 2 (TWO) TIMES DAILY. 02/14/16  Yes Nilda Simmer, NP  Meth-Hyo-M Barnett Hatter Phos-Ph Sal (URIBEL) 118 MG CAPS Take 1 capsule by mouth daily as needed.   Yes Historical Provider, MD  mirabegron ER (MYRBETRIQ) 25 MG TB24 tablet Take 25 mg by mouth daily.   Yes Historical Provider, MD  Multiple Vitamins-Minerals (PRESERVISION AREDS 2) CAPS Take 1 capsule by mouth 2 (two) times daily. Reported on 08/23/2015   Yes Historical Provider, MD  nateglinide (STARLIX) 120 MG tablet TAKE 1 TABLET BY MOUTH 3 TIMES A DAY BEFORE MEALS DUE 4-3 11/27/15  Yes [name], MD  Omega-3 Fatty Acids (FISH OIL) 1200 MG CAPS Take 4 capsules by mouth daily.    Yes Historical Provider, MD  omeprazole (PRILOSEC) 20 MG capsule Take 1 capsule (20 mg total) by mouth daily before breakfast. 04/27/15  Yes Mahala Menghini, PA-C  Pancrelipase, Lip-Prot-Amyl, (CREON) 24000 units CPEP Take one capsule 3 times a day with meals and one capsule with snacks. Patient  taking differently: Take 2 capsules by mouth daily. Take two capsule 3 times a day with meals and two capsule with snacks. 04/18/15  Yes Mahala Menghini, PA-C  Probiotic Product (ALIGN) 4 MG CAPS Take 1 capsule by mouth daily. Reported on 06/06/2015 01/18/11  Yes Sable Feil, MD  valsartan (DIOVAN) 80 MG tablet TAKE 1 TABLET (80 MG TOTAL) BY MOUTH DAILY. 02/14/16  Yes Nilda Simmer, NP  VITAMIN E PO Take 1 capsule by mouth 4 (four) times a week. Four times per week   Yes Historical Provider, MD  Vitamin Mixture (ESTER-C) 500-60 MG TABS Take 1 tablet by mouth 4 (four) times a week.    Yes Historical Provider, MD  Wheat Dextrin (BENEFIBER PO) Take 1 packet by mouth daily.    Yes Historical Provider, MD  meclizine (ANTIVERT) 25 MG tablet TAKE 1 TABLET (25 MG TOTAL) BY MOUTH 3 (THREE) TIMES DAILY AS NEEDED FOR DIZZINESS OR NAUSEA. 05/16/15   [name], MD  nateglinide (STARLIX) 120 MG tablet TAKE 1 TABLET BY MOUTH 3 TIMES A DAY BEFORE MEALS DUE 4-3 12/11/15   Kathyrn Drown, MD  oseltamivir (TAMIFLU) 75 MG capsule Take 1 capsule (75 mg total) by mouth every 12 (twelve) hours. 02/25/16 03/01/16  Deborah Blank, MD    Family History Family History  Problem Relation Age of Onset  . Ovarian cancer Mother   . Diabetes Father   . Heart disease Father   . Breast cancer Sister   . Liver cancer Sister   . Colon cancer Cousin     Paternal side  . Diabetes Maternal Aunt   . Liver disease Cousin     Maternal side, never drank    Social History Social History  Substance Use Topics  . Smoking status: Never Smoker  . Smokeless tobacco: Never Used     Comment: Never smoked  . Alcohol use No     Allergies   Estrogens; Other; and Sulfonamide derivatives   Review of Systems Review of Systems A complete 10 system review of systems was obtained and all systems are negative except as noted in the HPI and PMH.    Physical Exam Updated Vital Signs BP 168/76 (BP Location: Right Arm)   Pulse 116   Temp 100 F (37.8 C) (Oral)   Resp 20   Ht 5\' 4"  (1.626 m)   Wt 165 lb (74.8 kg)   SpO2 98%   BMI 28.32 kg/m   Physical Exam  Constitutional: She is oriented to person, place, and time. She appears well-developed and well-nourished. No distress.  HENT:  Head: Normocephalic and atraumatic.  Nose: Nose normal.  Eyes: Conjunctivae and EOM are normal. Pupils are equal, round, and reactive to light. Right eye exhibits no discharge. Left eye exhibits no discharge. No scleral icterus.  Neck: Normal range of motion. Neck supple.  Cardiovascular: Regular rhythm.  Tachycardia present.  Exam reveals no gallop and no friction rub.   No murmur heard. Pulmonary/Chest: Effort normal and breath sounds normal. No stridor. No respiratory distress. She has no rales.  Abdominal: Soft. She exhibits no distension. There is no tenderness.  Musculoskeletal: She exhibits no edema or tenderness.  Neurological: She is alert and oriented to person, place, and time.  Skin:  Skin is warm and dry. No rash noted. She is not diaphoretic. No erythema.  Psychiatric: She has a normal mood and affect.  Vitals reviewed.    ED Treatments / Results  DIAGNOSTIC STUDIES:  Oxygen Saturation is 98% on RA, normal by my interpretation.    COORDINATION OF CARE: 12:09 PM Discussed treatment plan with pt at bedside and pt agreed to plan.  Labs (all labs ordered are listed, but only abnormal results are displayed) Labs Reviewed  INFLUENZA PANEL BY PCR (TYPE A & B, H1N1) - Abnormal; Notable for the following:       Result Value   Influenza A By PCR POSITIVE (*)    All other components within normal limits    EKG  EKG Interpretation  Date/Time:  Sunday February 25 2016 10:52:30 EST Ventricular Rate:  103 PR Interval:    QRS Duration: 92 QT Interval:  340 QTC Calculation: 445 R Axis:   44 Text Interpretation:  Sinus tachycardia Ventricular premature complex Aberrant complex Baseline wander in lead(s) II V1 Confirmed by [facility] MD, Shontez Sermon ([id number]) on 02/25/2016 10:59:47 AM       Radiology Dg Chest 2 View  Result Date: 02/25/2016 CLINICAL DATA:  Cough, flu symptoms, hypertension, diabetes, remote breast surgery EXAM: CHEST  2 VIEW COMPARISON:  08/16/2015 FINDINGS: Normal heart size and vascularity. Mild hyperinflation and parenchymal scarring suspect COPD/emphysema. No focal pneumonia, collapse or consolidation. Negative for edema, effusion or pneumothorax. Trachea is midline. Atherosclerosis of the aorta and minor scoliosis of the spine. Remote cholecystectomy noted. IMPRESSION: Stable hyperinflation, suspect COPD/emphysema. No superimposed acute chest process Thoracic aortic atherosclerosis Electronically Signed   By: Jerilynn Mages.  Shick M.D.   On: 02/25/2016 11:11    Procedures Procedures (including critical care time)  Medications Ordered in ED Medications - No data to display   Initial Impression / Assessment and Plan / ED Course  I have reviewed the triage vital signs and  the nursing notes.  Pertinent labs & imaging results that were available during my care of the patient were reviewed by me and considered in my medical decision making (see chart for details).  Clinical Course     Chest x-ray without evidence of pneumonia. Influenza a positive. Patient is satting well on room air without evidence of toxicity. She is she is appropriate for discharge home with strict return precautions. Tamiflu prescribed as she is within the window; she will decide whether she would like to take it or not.   Final Clinical Impressions(s) / ED Diagnoses   Final diagnoses:  Influenza A   Disposition: Discharge  Condition: Good  I have discussed the results, Dx and Tx plan with the patient who expressed understanding and agree(s) with the plan. Discharge instructions discussed at great length. The patient was given strict return precautions who verbalized understanding of the instructions. No further questions at time of discharge.    New Prescriptions   OSELTAMIVIR (TAMIFLU) 75 MG CAPSULE    Take 1 capsule (75 mg total) by mouth every 12 (twelve) hours.    Follow Up: [name], [place] [place] [address] [phone]  Schedule an appointment as soon as possible for a visit  in 5-7 days, If symptoms do not improve or  worsen   I personally performed the services described in this documentation, which was scribed in my presence. The recorded information has been reviewed and is accurate.        Deborah Blank, MD 02/25/16 [id number]

## 2016-02-25 NOTE — ED Triage Notes (Signed)
Patient c/o nonproductive cough x3-4 days that is progressively gotten worse despite taking tussinex. Patient reports chills and body aches in which she took tylenol for last night. Per patient "woke last night in a sweat, like fever broke." Patient report chest sore with cough but denies chest pain. Patient also c/o left hip pain with no known injury.

## 2016-02-28 ENCOUNTER — Encounter: Payer: Self-pay | Admitting: Family Medicine

## 2016-02-28 ENCOUNTER — Ambulatory Visit (INDEPENDENT_AMBULATORY_CARE_PROVIDER_SITE_OTHER): Payer: Medicare Other | Admitting: Family Medicine

## 2016-02-28 VITALS — BP 132/82 | Temp 98.3°F | Ht 64.0 in | Wt 164.4 lb

## 2016-02-28 DIAGNOSIS — J019 Acute sinusitis, unspecified: Secondary | ICD-10-CM | POA: Diagnosis not present

## 2016-02-28 DIAGNOSIS — B9689 Other specified bacterial agents as the cause of diseases classified elsewhere: Secondary | ICD-10-CM

## 2016-02-28 MED ORDER — AMOXICILLIN 500 MG PO CAPS: 500.0000 mg | ORAL_CAPSULE | Freq: Three times a day (TID) | ORAL | 0 refills | Status: DC

## 2016-02-28 NOTE — Progress Notes (Signed)
   Subjective:    Patient ID: Deborah Gray, female    DOB: 08/22/1933, 83 y.o.   MRN: [id number]  Cough  This is a new problem. The current episode started in the past 7 days. Associated symptoms include ear pain, a fever, headaches, nasal congestion and wheezing. Associated symptoms comments: Rib pain.   burnign in throat  took tylenol  Got a feve as high as 103   Sig nasa disch and dranage   Bad cough for several days  Pos inf A   Os headache and sore throat and dim enrgy  Used tussionex prn for cough    Review of Systems  Constitutional: Positive for fever.  HENT: Positive for ear pain.   Respiratory: Positive for cough and wheezing.   Neurological: Positive for headaches.       Objective:   Physical Exam Alert mild malaise H&T moderatenasal congestionlungs clear. Heart  Regular in rhythm       Assessment & Plan:  Impression post flu rhinosinusitis plan antibiotics prescribed symptomcare discussed warns discussed

## 2016-03-04 DIAGNOSIS — E119 Type 2 diabetes mellitus without complications: Secondary | ICD-10-CM | POA: Diagnosis not present

## 2016-03-11 ENCOUNTER — Encounter: Payer: Self-pay | Admitting: Gastroenterology

## 2016-03-11 ENCOUNTER — Ambulatory Visit (INDEPENDENT_AMBULATORY_CARE_PROVIDER_SITE_OTHER): Payer: Medicare Other | Admitting: Gastroenterology

## 2016-03-11 ENCOUNTER — Other Ambulatory Visit: Payer: Self-pay

## 2016-03-11 VITALS — BP 126/65 | HR 84 | Temp 98.4°F | Ht 64.0 in | Wt 164.0 lb

## 2016-03-11 DIAGNOSIS — Z86010 Personal history of colonic polyps: Secondary | ICD-10-CM

## 2016-03-11 DIAGNOSIS — R140 Abdominal distension (gaseous): Secondary | ICD-10-CM

## 2016-03-11 MED ORDER — PEG 3350-KCL-NA BICARB-NACL 420 G PO SOLR: 4000.0000 mL | ORAL | 0 refills | Status: DC

## 2016-03-11 NOTE — Progress Notes (Signed)
Primary Care Physician:  Mickie Hillier, MD  Primary Gastroenterologist:  Garfield Cornea, MD   Chief Complaint  Patient presents with  . Colonoscopy    HPI:  Deborah Gray is a 83 y.o. female here for follow-up to schedule colonoscopy. She has a history of adenomatous colon polyps. Also with nutcracker esophagus, remote esophageal strictures, esophageal spasms improved on imipramine, idiopathic chronic pancreatitis, IBS. Requip UTIs, interstitial cystitis. EGD with reactive gastropathy earlier last year. Hydrogen breath test July 2017 negative.   She is having a lot of issues with upper abdominal discomfort, bloating, sensation she could not take a deep breath. Cardiac workup unremarkable last June. When she eats she feels a lot of gurgling and pressure building up and if she's able to pass gas she does get some relief. She feels like her colon is not emptying well or she has a blockage. Denies melena rectal bleeding. No diarrhea.   We did increase her pancreatic enzymes after last OV with some improvement of her symptoms.  Recently had the flu, complicated by sinusitis. Just completing antibiotics.   Current Outpatient Prescriptions  Medication Sig Dispense Refill  . acetaminophen (TYLENOL) 500 MG tablet Take 500 mg by mouth at bedtime.    Marland Kitchen aspirin 81 MG tablet Take 81 mg by mouth at bedtime.      Marland Kitchen azelastine (ASTELIN) 0.1 % nasal spray Place 1 spray into both nostrils as needed. Use in each nostril as directed     . benzonatate (TESSALON) 100 MG capsule Take 1 capsule (100 mg total) by mouth 3 (three) times daily as needed for cough. 45 capsule 3  . cephALEXin (KEFLEX) 250 MG capsule Take 250 mg by mouth 2 (two) times daily.  0  . chlorpheniramine-HYDROcodone (TUSSIONEX PENNKINETIC ER) 10-8 MG/5ML SUER Take 5 mLs by mouth at bedtime as needed for cough.    . ezetimibe (ZETIA) 10 MG tablet TAKE 1 TABLET (10 MG TOTAL) BY MOUTH DAILY. 90 tablet 2  . guaiFENesin (MUCINEX) 600 MG 12 hr  tablet Take 600 mg by mouth daily as needed.    Marland Kitchen imipramine (TOFRANIL) 10 MG tablet Take 3 tablets (30 mg total) by mouth at bedtime. (Patient taking differently: Take 10 mg by mouth at bedtime. ) 90 tablet 5  . Ketotifen Fumarate (THERA TEARS ALLERGY OP) Apply 1 drop to eye 2 (two) times daily.     . meclizine (ANTIVERT) 25 MG tablet TAKE 1 TABLET (25 MG TOTAL) BY MOUTH 3 (THREE) TIMES DAILY AS NEEDED FOR DIZZINESS OR NAUSEA. 30 tablet 1  . metFORMIN (GLUCOPHAGE) 500 MG tablet TAKE 1 TABLET (500 MG TOTAL) BY MOUTH 2 (TWO) TIMES DAILY. 180 tablet 1  . Meth-Hyo-M Bl-Na Phos-Ph Sal (URIBEL) 118 MG CAPS Take 1 capsule by mouth daily as needed.    . mirabegron ER (MYRBETRIQ) 25 MG TB24 tablet Take 25 mg by mouth daily.    . Multiple Vitamins-Minerals (PRESERVISION AREDS 2) CAPS Take 1 capsule by mouth 2 (two) times daily. Reported on 08/23/2015    . nateglinide (STARLIX) 120 MG tablet TAKE 1 TABLET BY MOUTH 3 TIMES A DAY BEFORE MEALS DUE 4-3 270 tablet 0  . nateglinide (STARLIX) 120 MG tablet TAKE 1 TABLET BY MOUTH 3 TIMES A DAY BEFORE MEALS DUE 4-3 270 tablet 1  . Omega-3 Fatty Acids (FISH OIL) 1200 MG CAPS Take 4 capsules by mouth daily.     Marland Kitchen omeprazole (PRILOSEC) 20 MG capsule Take 1 capsule (20 mg total) by mouth daily before  breakfast. 90 capsule 3  . Pancrelipase, Lip-Prot-Amyl, (CREON) 24000 units CPEP Take one capsule 3 times a day with meals and one capsule with snacks. (Patient taking differently: Take 2 capsules by mouth daily. Take two capsule 3 times a day with meals and two capsule with snacks.) 450 capsule 3  . Probiotic Product (ALIGN) 4 MG CAPS Take 1 capsule by mouth daily. Reported on 06/06/2015    . valsartan (DIOVAN) 80 MG tablet TAKE 1 TABLET (80 MG TOTAL) BY MOUTH DAILY. 90 tablet 1  . VITAMIN E PO Take 1 capsule by mouth 4 (four) times a week. Four times per week    . Vitamin Mixture (ESTER-C) 500-60 MG TABS Take 1 tablet by mouth 4 (four) times a week.     . Wheat Dextrin  (BENEFIBER PO) Take 1 packet by mouth daily.     . polyethylene glycol-electrolytes (TRILYTE) 420 g solution Take 4,000 mLs by mouth as directed. 4000 mL 0   No current facility-administered medications for this visit.     Allergies as of 03/11/2016 - Review Complete 03/11/2016  Allergen Reaction Noted  . Estrogens Other (See Comments) 10/15/2012  . Other  09/01/2013  . Sulfonamide derivatives Rash 03/01/2008    Past Medical History:  Diagnosis Date  . Arthritis   . Benign neoplasm of colon   . Constipation   . Diaphragmatic hernia without mention of obstruction or gangrene   . Diverticulosis of colon (without mention of hemorrhage)   . Dysphagia, pharyngoesophageal phase   . Esophageal reflux   . Essential hypertension   . Heart murmur   . Hemorrhoids   . Hyperlipidemia   . Internal hemorrhoids without mention of complication   . PONV (postoperative nausea and vomiting)   . Stricture and stenosis of esophagus   . Type 2 diabetes mellitus ([place])   . Unspecified gastritis and gastroduodenitis without mention of hemorrhage     Past Surgical History:  Procedure Laterality Date  . ABDOMINAL HYSTERECTOMY    . BACTERIAL OVERGROWTH TEST N/A 09/06/2015   Procedure: BACTERIAL OVERGROWTH TEST;  Surgeon: Daneil Dolin, MD;  Location: [facility];  Service: Endoscopy;  Laterality: N/A;  800  . BREAST LUMPECTOMY Right    benign  . CATARACT EXTRACTION Bilateral 2006   Implants in both, surgeries done 6 weeks apart  . CHOLECYSTECTOMY    . COLONOSCOPY  03/08/02   [name]: diverticulosis, internal hemorrhoids, adenomatous colon polyp  . COLONOSCOPY  01/23/04   [name]: diverticulosis, internal hemorrhoids  . COLONOSCOPY  09/25/05   [name]: diverticulosis  . COLONOSCOPY  07/05/09   [name]: severe diverticulosis  . COLONOSCOPY  01/21/11   [name]: severe diverticulosis in sigmoid to desc colon, int hemorrhoids, follow up TCS in 5 years  . DILATION AND CURETTAGE OF UTERUS     . ESOPHAGEAL MANOMETRY  03/21/08   Patterson: findings c/w Nutcracker Esophagus  . ESOPHAGOGASTRODUODENOSCOPY  03/08/02   [name]: esophageal stricture, chronic gerd, s/p dilation.   . ESOPHAGOGASTRODUODENOSCOPY  01/23/04   [name]: esophageal stricture, gastritis, hiatal hernia  . ESOPHAGOGASTRODUODENOSCOPY  09/25/05   Patterson: gastritis, benign bx, no H.pylori  . ESOPHAGOGASTRODUODENOSCOPY  07/05/09   Patterson: gastropathy, benign small bowel bx and gastric bx  . ESOPHAGOGASTRODUODENOSCOPY N/A 05/15/2015   Dr. Gala Romney- diffuse moderate inflammation characterized by congestion (edema), erythema, and linear erosions was found in the entire examined stomach. bx= reactive gastropathy  . FOOT SURGERY Left   . HEMORRHOID BANDING  2017   Dr.[name]  . LAPAROSCOPIC  VAGINAL HYSTERECTOMY    . RECTOCELE REPAIR    . TOTAL KNEE ARTHROPLASTY Right     Family History  Problem Relation Age of Onset  . Ovarian cancer Mother   . Diabetes Father   . Heart disease Father   . Breast cancer Sister   . Liver cancer Sister   . Colon cancer Cousin     Paternal side  . Diabetes Maternal Aunt   . Liver disease Cousin     Maternal side, never drank    Social History   Social History  . Marital status: Married    Spouse name: N/A  . Number of children: 3  . Years of education: N/A   Occupational History  . Retired    Social History Main Topics  . Smoking status: Never Smoker  . Smokeless tobacco: Never Used     Comment: Never smoked  . Alcohol use No  . Drug use: No  . Sexual activity: Not on file   Other Topics Concern  . Not on file   Social History Narrative  . No narrative on file      ROS:  General: Negative for anorexia, weight loss, fever, chills, fatigue, weakness. Eyes: Negative for vision changes.  ENT: Negative for hoarseness, difficulty swallowing , nasal congestion. CV: Negative for chest pain, angina, palpitations, dyspnea on exertion, peripheral edema.   Respiratory: Negative for dyspnea at rest, dyspnea on exertion, cough, sputum, wheezing.  GI: See history of present illness. GU:  Negative for dysuria, hematuria, urinary incontinence, urinary frequency, nocturnal urination.  MS: Negative for joint pain, low back pain.  Derm: Negative for rash or itching.  Neuro: Negative for weakness, abnormal sensation, seizure, frequent headaches, memory loss, confusion.  Psych: Negative for anxiety, depression, suicidal ideation, hallucinations.  Endo: Negative for unusual weight change.  Heme: Negative for bruising or bleeding. Allergy: Negative for rash or hives.    Physical Examination:  BP 126/65   Pulse 84   Temp 98.4 F (36.9 C) (Oral)   Ht 5\' 4"  (1.626 m)   Wt 164 lb (74.4 kg)   BMI 28.15 kg/m    General: Well-nourished, well-developed in no acute distress.  Head: Normocephalic, atraumatic.   Eyes: Conjunctiva pink, no icterus. Mouth: Oropharyngeal mucosa moist and pink , no lesions erythema or exudate. Neck: Supple without thyromegaly, masses, or lymphadenopathy.  Lungs: Clear to auscultation bilaterally.  Heart: Regular rate and rhythm, no murmurs rubs or gallops.  Abdomen: Bowel sounds are normal, nontender, nondistended, no hepatosplenomegaly or masses, no abdominal bruits or    hernia , no rebound or guarding.   Rectal: deferred Extremities: No lower extremity edema. No clubbing or deformities.  Neuro: Alert and oriented x 4 , grossly normal neurologically.  Skin: Warm and dry, no rash or jaundice.   Psych: Alert and cooperative, normal mood and affect.  Labs: Lab Results  Component Value Date   CREATININE 0.90 12/18/2015   BUN 19 12/18/2015   NA 144 12/18/2015   K 5.4 (H) 12/18/2015   CL 98 12/18/2015   CO2 26 12/18/2015   Lab Results  Component Value Date   ALT 17 12/18/2015   AST 15 12/18/2015   ALKPHOS 83 12/18/2015   BILITOT 0.4 12/18/2015   Lab Results  Component Value Date   WBC 7.4 01/22/2016   HGB  13.6 08/16/2015   HCT 40.9 01/22/2016   MCV 88 01/22/2016   PLT 248 01/22/2016     Imaging Studies: Dg Chest 2 View  Result Date: 02/25/2016 CLINICAL DATA:  Cough, flu symptoms, hypertension, diabetes, remote breast surgery EXAM: CHEST  2 VIEW COMPARISON:  08/16/2015 FINDINGS: Normal heart size and vascularity. Mild hyperinflation and parenchymal scarring suspect COPD/emphysema. No focal pneumonia, collapse or consolidation. Negative for edema, effusion or pneumothorax. Trachea is midline. Atherosclerosis of the aorta and minor scoliosis of the spine. Remote cholecystectomy noted. IMPRESSION: Stable hyperinflation, suspect COPD/emphysema. No superimposed acute chest process Thoracic aortic atherosclerosis Electronically Signed   By: Jerilynn Mages.  Shick M.D.   On: 02/25/2016 11:11

## 2016-03-11 NOTE — Patient Instructions (Signed)
1. Colonoscopy as scheduled. See separate instructions.  

## 2016-03-11 NOTE — Addendum Note (Signed)
Addended by: Mahala Menghini on: 03/11/2016 12:58 PM   Modules accepted: Level of Service

## 2016-03-11 NOTE — Progress Notes (Signed)
cc'ed to pcp °

## 2016-03-11 NOTE — Assessment & Plan Note (Addendum)
83 year female with multiple chronic GI issues with ongoing epigastric discomfort associated with bloating, sensation of breath being cut off. Somewhat improved with increase Creon. Cardiac workup unremarkable recently. Sensation of food not passing freely, develops bloating with meals, then a "gurgling" sound/feeling in the upper abdomen followed by relief of bloating and abdominal discomfort. Her history of adenomatous colon polyps, decision was made update her colonoscopy as well as further evaluate her symptoms.  I have discussed the risks, alternatives, benefits with regards to but not limited to the risk of reaction to medication, bleeding, infection, perforation and the patient is agreeable to proceed. Written consent to be obtained.  Given recent flu, we will wait 3 weeks prior to colonoscopy to allow more time to recover.

## 2016-03-19 ENCOUNTER — Ambulatory Visit (INDEPENDENT_AMBULATORY_CARE_PROVIDER_SITE_OTHER): Payer: Medicare Other | Admitting: Urology

## 2016-03-19 DIAGNOSIS — N3000 Acute cystitis without hematuria: Secondary | ICD-10-CM

## 2016-03-20 DIAGNOSIS — H353221 Exudative age-related macular degeneration, left eye, with active choroidal neovascularization: Secondary | ICD-10-CM | POA: Diagnosis not present

## 2016-03-28 ENCOUNTER — Telehealth: Payer: Self-pay | Admitting: Internal Medicine

## 2016-03-28 NOTE — Telephone Encounter (Signed)
Pt is scheduled with [facility] on 2/21 for colonoscopy. Pt is a diabetic. She wants to know should she eat Jell-O with sugar to keep her sugar from getting too low or should she continue eating sugar free Jell-O. Please call her at [phone]

## 2016-03-28 NOTE — Telephone Encounter (Signed)
Tried to call with no answer  

## 2016-03-31 ENCOUNTER — Encounter (HOSPITAL_COMMUNITY): Payer: Self-pay | Admitting: Emergency Medicine

## 2016-03-31 ENCOUNTER — Emergency Department (HOSPITAL_COMMUNITY)
Admission: EM | Admit: 2016-03-31 | Discharge: 2016-03-31 | Disposition: A | Discharge status: Home / Self Care | Payer: Medicare Other | Attending: Emergency Medicine | Admitting: Emergency Medicine

## 2016-03-31 DIAGNOSIS — I10 Essential (primary) hypertension: Secondary | ICD-10-CM | POA: Diagnosis not present

## 2016-03-31 DIAGNOSIS — Z7984 Long term (current) use of oral hypoglycemic drugs: Secondary | ICD-10-CM | POA: Insufficient documentation

## 2016-03-31 DIAGNOSIS — R110 Nausea: Secondary | ICD-10-CM | POA: Insufficient documentation

## 2016-03-31 DIAGNOSIS — E119 Type 2 diabetes mellitus without complications: Secondary | ICD-10-CM | POA: Insufficient documentation

## 2016-03-31 DIAGNOSIS — R197 Diarrhea, unspecified: Secondary | ICD-10-CM | POA: Diagnosis not present

## 2016-03-31 DIAGNOSIS — Z79899 Other long term (current) drug therapy: Secondary | ICD-10-CM | POA: Insufficient documentation

## 2016-03-31 DIAGNOSIS — R1084 Generalized abdominal pain: Secondary | ICD-10-CM | POA: Insufficient documentation

## 2016-03-31 DIAGNOSIS — R109 Unspecified abdominal pain: Secondary | ICD-10-CM | POA: Diagnosis not present

## 2016-03-31 DIAGNOSIS — H6691 Otitis media, unspecified, right ear: Secondary | ICD-10-CM | POA: Diagnosis not present

## 2016-03-31 DIAGNOSIS — Z7982 Long term (current) use of aspirin: Secondary | ICD-10-CM | POA: Diagnosis not present

## 2016-03-31 LAB — COMPREHENSIVE METABOLIC PANEL
ALT: 15 U/L (ref 14–54)
ANION GAP: 8 (ref 5–15)
AST: 17 U/L (ref 15–41)
Albumin: 3.6 g/dL (ref 3.5–5.0)
Alkaline Phosphatase: 56 U/L (ref 38–126)
BILIRUBIN TOTAL: 0.4 mg/dL (ref 0.3–1.2)
BUN: 22 mg/dL — AB (ref 6–20)
CO2: 26 mmol/L (ref 22–32)
Calcium: 9.2 mg/dL (ref 8.9–10.3)
Chloride: 104 mmol/L (ref 101–111)
Creatinine, Ser: 0.67 mg/dL (ref 0.44–1.00)
Glucose, Bld: 124 mg/dL — ABNORMAL HIGH (ref 65–99)
POTASSIUM: 4.8 mmol/L (ref 3.5–5.1)
Sodium: 138 mmol/L (ref 135–145)
TOTAL PROTEIN: 7.1 g/dL (ref 6.5–8.1)

## 2016-03-31 LAB — CBC
HEMATOCRIT: 44.6 % (ref 36.0–46.0)
Hemoglobin: 14.8 g/dL (ref 12.0–15.0)
MCH: 29.6 pg (ref 26.0–34.0)
MCHC: 33.2 g/dL (ref 30.0–36.0)
MCV: 89.2 fL (ref 78.0–100.0)
Platelets: 176 10*3/uL (ref 150–400)
RBC: 5 MIL/uL (ref 3.87–5.11)
RDW: 14.3 % (ref 11.5–15.5)
WBC: 7.7 10*3/uL (ref 4.0–10.5)

## 2016-03-31 LAB — URINALYSIS, ROUTINE W REFLEX MICROSCOPIC
Bilirubin Urine: NEGATIVE
Glucose, UA: NEGATIVE mg/dL
Hgb urine dipstick: NEGATIVE
KETONES UR: NEGATIVE mg/dL
LEUKOCYTES UA: NEGATIVE
Nitrite: NEGATIVE
PH: 5 (ref 5.0–8.0)
PROTEIN: NEGATIVE mg/dL
Specific Gravity, Urine: 1.016 (ref 1.005–1.030)

## 2016-03-31 LAB — LIPASE, BLOOD: Lipase: <10 U/L — ABNORMAL LOW (ref 11–51)

## 2016-03-31 MED ORDER — ONDANSETRON HCL 4 MG PO TABS: 4.0000 mg | ORAL_TABLET | Freq: Four times a day (QID) | ORAL | 0 refills | Status: DC

## 2016-03-31 MED ORDER — SODIUM CHLORIDE 0.9 % IV BOLUS (SEPSIS)
1000.0000 mL | Freq: Once | INTRAVENOUS | Status: AC
  Administered 2016-03-31: 1000 mL via INTRAVENOUS

## 2016-03-31 MED ORDER — ONDANSETRON HCL 4 MG/2ML IJ SOLN
4.0000 mg | Freq: Once | INTRAMUSCULAR | Status: AC
  Administered 2016-03-31: 4 mg via INTRAVENOUS
  Filled 2016-03-31: qty 2

## 2016-03-31 MED ORDER — AMOXICILLIN 500 MG PO CAPS: 500.0000 mg | ORAL_CAPSULE | Freq: Three times a day (TID) | ORAL | 0 refills | Status: DC

## 2016-03-31 NOTE — Discharge Instructions (Signed)
Increase fluids. Medication for nausea. Rest. Follow-up your primary care doctor.

## 2016-03-31 NOTE — ED Triage Notes (Signed)
Patient complains of diarrhea and abdominal cramping and nausea. Patient states if she eats it causes diarrhea.

## 2016-03-31 NOTE — ED Notes (Signed)
Recent flu, earache last night hx of pancreas problems and recent doubling of meds Yesterday had D, feels that she is too full and she cannot drink because she feels too full. Can only drink 8 oz a time Scheduled for Colonoscopy 2/21 and she is followed Dr Dr Wolfgang Phoenix-  States she gets spells like this and she takes a pill that she puts under her tongue and it helps She took a pill for her vertigo and it improved She has had D and " it has calmed down"

## 2016-03-31 NOTE — ED Provider Notes (Signed)
[facility] DEPT Provider Note   CSN: [id number] Arrival date & time: 03/31/16  1025   By signing my name below, I, [name], attest that this documentation has been prepared under the direction and in the presence of Nat Christen, MD. Electronically Signed: [name], Scribe. 03/31/16. 1:48 PM. History   Chief Complaint Chief Complaint  Patient presents with  . Abdominal Pain  . Nausea  . Diarrhea    The history is provided by the patient. No language interpreter was used.   HPI Comments: Deborah Gray is a 83 y.o. female who presents to the Emergency Department complaining of gradually worsening abdominal pain with associated nausea and diarrhea for the past few days. The patient states that whenever she attempts to eat anything she has diarrhea. She also reports having some right ear pain. She denies dysuria and difficulty urinating. The patient has had multiple pancreatic problems in the past. She has problem when eating fatty foods. She lives with her husband. She is currently scheduled for a colonoscopy on 04/10/2016.Review systems positive for right ear pain  Past Medical History:  Diagnosis Date  . Arthritis   . Benign neoplasm of colon   . Constipation   . Diaphragmatic hernia without mention of obstruction or gangrene   . Diverticulosis of colon (without mention of hemorrhage)   . Dysphagia, pharyngoesophageal phase   . Esophageal reflux   . Essential hypertension   . Heart murmur   . Hemorrhoids   . Hyperlipidemia   . Internal hemorrhoids without mention of complication   . PONV (postoperative nausea and vomiting)   . Stricture and stenosis of esophagus   . Type 2 diabetes mellitus ([place])   . Unspecified gastritis and gastroduodenitis without mention of hemorrhage     Patient Active Problem List   Diagnosis Date Noted  . Bloating 08/30/2015  . Exertional chest pain 08/16/2015  . Chest pain 08/16/2015  . Mucosal abnormality of stomach   .  Hemorrhoids 04/18/2015  . IBS (irritable bowel syndrome) 01/30/2015  . Type 2 diabetes mellitus ([place]) 12/22/2014  . Hyperlipidemia 12/22/2014  . Prolapse of female pelvic organs 11/28/2013  . Precordial pain 11/25/2013  . Essential hypertension 11/25/2013  . Heart murmur 11/25/2013  . Osteopenia 10/23/2012  . Chronic pancreatitis ([place]) 02/26/2011  . GERD (gastroesophageal reflux disease) 01/18/2011  . Constipation 11/29/2010  . Esophageal spasm 11/29/2010  . DJD (degenerative joint disease) 11/29/2010  . Hx of adenomatous colonic polyps 06/29/2009  . ESOPHAGEAL MOTILITY DISORDER 04/01/2008  . DM 03/01/2008  . HEMORRHOIDS, INTERNAL 07/02/2007  . Diverticulosis of large intestine 07/02/2007    Past Surgical History:  Procedure Laterality Date  . ABDOMINAL HYSTERECTOMY    . BACTERIAL OVERGROWTH TEST N/A 09/06/2015   Procedure: BACTERIAL OVERGROWTH TEST;  Surgeon: Daneil Dolin, MD;  Location: [facility];  Service: Endoscopy;  Laterality: N/A;  800  . BREAST LUMPECTOMY Right    benign  . CATARACT EXTRACTION Bilateral 2006   Implants in both, surgeries done 6 weeks apart  . CHOLECYSTECTOMY    . COLONOSCOPY  03/08/02   [name]: diverticulosis, internal hemorrhoids, adenomatous colon polyp  . COLONOSCOPY  01/23/04   [name]: diverticulosis, internal hemorrhoids  . COLONOSCOPY  09/25/05   [name]: diverticulosis  . COLONOSCOPY  07/05/09   [name]: severe diverticulosis  . COLONOSCOPY  01/21/11   [name]: severe diverticulosis in sigmoid to desc colon, int hemorrhoids, follow up TCS in 5 years  . DILATION AND CURETTAGE OF UTERUS    .  ESOPHAGEAL MANOMETRY  03/21/08   [facility]: findings c/w [place] Esophagus  . ESOPHAGOGASTRODUODENOSCOPY  03/08/02   [name]: esophageal stricture, chronic gerd, s/p dilation.   . ESOPHAGOGASTRODUODENOSCOPY  01/23/04   [name]: esophageal stricture, gastritis, hiatal hernia  . ESOPHAGOGASTRODUODENOSCOPY  09/25/05   [facility]: gastritis,  benign bx, no H.pylori  . ESOPHAGOGASTRODUODENOSCOPY  07/05/09   [facility]: gastropathy, benign small bowel bx and gastric bx  . ESOPHAGOGASTRODUODENOSCOPY N/A 05/15/2015   Dr. Gala Romney- diffuse moderate inflammation characterized by congestion (edema), erythema, and linear erosions was found in the entire examined stomach. bx= reactive gastropathy  . FOOT SURGERY Left   . HEMORRHOID BANDING  2017   Dr.[name]  . LAPAROSCOPIC VAGINAL HYSTERECTOMY    . RECTOCELE REPAIR    . TOTAL KNEE ARTHROPLASTY Right     OB History    Gravida Para Term Preterm AB Living   4 3 3   1 3    SAB TAB Ectopic Multiple Live Births   1               Home Medications    Prior to Admission medications   Medication Sig Start Date End Date Taking? Authorizing Provider  acetaminophen (TYLENOL) 500 MG tablet Take 500 mg by mouth at bedtime.   Yes Historical Provider, MD  aspirin 81 MG tablet Take 81 mg by mouth at bedtime.     Yes Historical Provider, MD  azelastine (ASTELIN) 0.1 % nasal spray Place 1 spray into both nostrils as needed. Use in each nostril as directed    Yes Historical Provider, MD  benzonatate (TESSALON) 100 MG capsule Take 1 capsule (100 mg total) by mouth 3 (three) times daily as needed for cough. 09/25/15  Yes Kathyrn Drown, MD  cephALEXin (KEFLEX) 500 MG capsule TAKE 1 CAPSULE BY MOUTH 3 TIMES A DAY AFTER FIRST WEEK, TAKE IN PLACE OF THE TMP. 03/25/16  Yes Historical Provider, MD  ezetimibe (ZETIA) 10 MG tablet TAKE 1 TABLET (10 MG TOTAL) BY MOUTH DAILY. 08/02/15  Yes [name], MD  guaiFENesin (MUCINEX) 600 MG 12 hr tablet Take 600 mg by mouth daily as needed.   Yes Historical Provider, MD  imipramine (TOFRANIL) 10 MG tablet Take 3 tablets (30 mg total) by mouth at bedtime. Patient taking differently: Take 10 mg by mouth at bedtime.  06/27/15  Yes Mahala Menghini, PA-C  Ketotifen Fumarate (THERA TEARS ALLERGY OP) Apply 1 drop to eye 2 (two) times daily.    Yes Historical Provider, MD  meclizine  (ANTIVERT) 25 MG tablet TAKE 1 TABLET (25 MG TOTAL) BY MOUTH 3 (THREE) TIMES DAILY AS NEEDED FOR DIZZINESS OR NAUSEA. 05/16/15  Yes [name], MD  metFORMIN (GLUCOPHAGE) 500 MG tablet TAKE 1 TABLET (500 MG TOTAL) BY MOUTH 2 (TWO) TIMES DAILY. 02/14/16  Yes Nilda Simmer, NP  Meth-Hyo-M Barnett Hatter Phos-Ph Sal (URIBEL) 118 MG CAPS Take 1 capsule by mouth daily as needed.   Yes Historical Provider, MD  mirabegron ER (MYRBETRIQ) 25 MG TB24 tablet Take 25 mg by mouth daily.   Yes Historical Provider, MD  Multiple Vitamins-Minerals (PRESERVISION AREDS 2) CAPS Take 1 capsule by mouth 2 (two) times daily. Reported on 08/23/2015   Yes Historical Provider, MD  nateglinide (STARLIX) 120 MG tablet TAKE 1 TABLET BY MOUTH 3 TIMES A DAY BEFORE MEALS DUE 4-3 11/27/15  Yes [name], MD  Omega-3 Fatty Acids (FISH OIL) 1200 MG CAPS Take 4 capsules by mouth daily.    Yes Historical  Provider, MD  omeprazole (PRILOSEC) 20 MG capsule Take 1 capsule (20 mg total) by mouth daily before breakfast. 04/27/15  Yes Mahala Menghini, PA-C  Pancrelipase, Lip-Prot-Amyl, (CREON) 24000 units CPEP Take one capsule 3 times a day with meals and one capsule with snacks. Patient taking differently: Take 2 capsules by mouth daily. Take two capsule 3 times a day with meals and two capsule with snacks. 04/18/15  Yes Mahala Menghini, PA-C  valsartan (DIOVAN) 80 MG tablet TAKE 1 TABLET (80 MG TOTAL) BY MOUTH DAILY. 02/14/16  Yes Nilda Simmer, NP  VITAMIN E PO Take 1 capsule by mouth 4 (four) times a week. Four times per week   Yes Historical Provider, MD  Vitamin Mixture (ESTER-C) 500-60 MG TABS Take 1 tablet by mouth 4 (four) times a week.    Yes Historical Provider, MD  Wheat Dextrin (BENEFIBER PO) Take 1 packet by mouth daily.    Yes Historical Provider, MD  amoxicillin (AMOXIL) 500 MG capsule Take 1 capsule (500 mg total) by mouth 3 (three) times daily. 03/31/16   Nat Christen, MD  ondansetron (ZOFRAN) 4 MG tablet Take 1 tablet (4 mg  total) by mouth every 6 (six) hours. 03/31/16   Nat Christen, MD  polyethylene glycol-electrolytes (TRILYTE) 420 g solution Take 4,000 mLs by mouth as directed. Patient not taking: Reported on 03/31/2016 03/11/16   Daneil Dolin, MD    Family History Family History  Problem Relation Age of Onset  . Ovarian cancer Mother   . Diabetes Father   . Heart disease Father   . Breast cancer Sister   . Liver cancer Sister   . Colon cancer Cousin     Paternal side  . Diabetes Maternal Aunt   . Liver disease Cousin     Maternal side, never drank    Social History Social History  Substance Use Topics  . Smoking status: Never Smoker  . Smokeless tobacco: Never Used     Comment: Never smoked  . Alcohol use No     Allergies   Estrogens; Other; and Sulfonamide derivatives   Review of Systems Review of Systems A complete 10 system review of systems was obtained and all systems are negative except as noted in the HPI and PMH.    Physical Exam Updated Vital Signs BP (!) 121/103 (BP Location: Left Arm)   Pulse 90   Temp 98.4 F (36.9 C) (Oral)   Resp 18   Ht 5\' 4"  (1.626 m)   Wt 164 lb (74.4 kg)   SpO2 97%   BMI 28.15 kg/m   Physical Exam  Constitutional: She is oriented to person, place, and time. She appears well-developed and well-nourished.  HENT:  Head: Normocephalic and atraumatic.  Questionable right tympanic membrane inflamed  Eyes: Conjunctivae are normal.  Neck: Neck supple.  Cardiovascular: Normal rate and regular rhythm.   Pulmonary/Chest: Effort normal and breath sounds normal.  Abdominal: There is tenderness.  Mild generalized tenderness.  Musculoskeletal: Normal range of motion.  Neurological: She is alert and oriented to person, place, and time.  Skin: Skin is warm and dry.  Psychiatric: She has a normal mood and affect. Her behavior is normal.  Nursing note and vitals reviewed.    ED Treatments / Results  DIAGNOSTIC STUDIES: Oxygen Saturation is 100%  on RA, normal by my interpretation.    COORDINATION OF CARE: 1:03 PM Discussed treatment plan with pt at bedside and pt agreed to plan. I will start the patient  on IV fluids and Zofran. I will check her blood work and UA.  Labs (all labs ordered are listed, but only abnormal results are displayed) Labs Reviewed  LIPASE, BLOOD - Abnormal; Notable for the following:       Result Value   Lipase <10 (*)    All other components within normal limits  COMPREHENSIVE METABOLIC PANEL - Abnormal; Notable for the following:    Glucose, Bld 124 (*)    BUN 22 (*)    All other components within normal limits  URINALYSIS, ROUTINE W REFLEX MICROSCOPIC - Abnormal; Notable for the following:    APPearance HAZY (*)    All other components within normal limits  CBC    EKG  EKG Interpretation None       Radiology No results found.  Procedures Procedures (including critical care time)  Medications Ordered in ED Medications  sodium chloride 0.9 % bolus 1,000 mL (0 mLs Intravenous Stopped 03/31/16 1746)  ondansetron (ZOFRAN) injection 4 mg (4 mg Intravenous Given 03/31/16 1402)     Initial Impression / Assessment and Plan / ED Course  I have reviewed the triage vital signs and the nursing notes.  Pertinent labs & imaging results that were available during my care of the patient were reviewed by me and considered in my medical decision making (see chart for details).     No acute abdomen.  White count, lipase, liver functions, urinalysis all normal. Patient feels better after IV fluids.  Discharge medications Zofran 4 mg and amoxicillin 500 mg  Final Clinical Impressions(s) / ED Diagnoses   Final diagnoses:  Abdominal pain, unspecified abdominal location  Right otitis media, unspecified otitis media type    New Prescriptions Discharge Medication List as of 03/31/2016  5:12 PM    START taking these medications   Details  ondansetron (ZOFRAN) 4 MG tablet Take 1 tablet (4 mg total) by  mouth every 6 (six) hours., Starting Sun 03/31/2016, Print       I personally performed the services described in this documentation, which was scribed in my presence. The recorded information has been reviewed and is accurate.     Nat Christen, MD 03/31/16 [phone]

## 2016-04-01 ENCOUNTER — Telehealth: Payer: Self-pay

## 2016-04-01 DIAGNOSIS — R197 Diarrhea, unspecified: Secondary | ICD-10-CM

## 2016-04-01 MED ORDER — METRONIDAZOLE 500 MG PO TABS: 500.0000 mg | ORAL_TABLET | Freq: Three times a day (TID) | ORAL | 0 refills | Status: DC

## 2016-04-01 NOTE — Telephone Encounter (Signed)
Pt called- she started having diarrhea on Saturday. She went to the ED on Sunday, was told her pancreas blood work was ok and that she had a GI bug. It is watery diarrhea so many times a day, she lost count. Feeling weak, no appetite. Recently finished amox 500mg  tid for 10 days, from her pcp for an sinus infection. She finished it on 03/11/16. Temp on Friday was 101. Normal today. Food and liquids are going straight thru her. Pt got IV fluids in the ED yesterday. Pt is scheduled for TCS on 04/10/16.  Encouraged pt to push fluids and I would let LSL know and get back with her.

## 2016-04-01 NOTE — Telephone Encounter (Signed)
Reviewed ED records.   Let's send stool for GI pathogen panel with [facility]. NOT [facility]  Once stool collected, I want her to start Flagyl 500mg  tid with food for 14 days.   We may have to cancel her TCS, but let's see how things go over the couple of days.

## 2016-04-01 NOTE — Telephone Encounter (Signed)
Pt is aware. She will go by [facility] and get container. She understands that she needs to turn in stool sample prior to taking flagyl and that we may have to reschedule her tcs.

## 2016-04-02 DIAGNOSIS — A09 Infectious gastroenteritis and colitis, unspecified: Secondary | ICD-10-CM | POA: Diagnosis not present

## 2016-04-03 ENCOUNTER — Telehealth: Payer: Self-pay | Admitting: Internal Medicine

## 2016-04-03 NOTE — Telephone Encounter (Signed)
Answered her questions about her TCS

## 2016-04-03 NOTE — Telephone Encounter (Signed)
Pt called to see if her stool results were available. She also had questions about her meds. [phone]

## 2016-04-04 ENCOUNTER — Telehealth: Payer: Self-pay | Admitting: Internal Medicine

## 2016-04-04 LAB — GASTROINTESTINAL PATHOGEN PANEL PCR
C. difficile Tox A/B, PCR: NOT DETECTED
CAMPYLOBACTER, PCR: NOT DETECTED
CRYPTOSPORIDIUM, PCR: NOT DETECTED
E coli (ETEC) LT/ST PCR: NOT DETECTED
E coli (STEC) stx1/stx2, PCR: NOT DETECTED
E coli 0157, PCR: NOT DETECTED
GIARDIA LAMBLIA, PCR: NOT DETECTED
NOROVIRUS, PCR: NOT DETECTED
ROTAVIRUS, PCR: NOT DETECTED
Salmonella, PCR: NOT DETECTED
Shigella, PCR: NOT DETECTED

## 2016-04-04 NOTE — Telephone Encounter (Signed)
Spoke with the pt, informed her that we do not have any results back yet for her, I checked the [facility] website and they do not have her results available yet. Pt stated she is feeling better now, she had a small amount of diarrhea on Tuesday but none since. She had a small formed bm today, still has bloating but its getting better. Pt did not pick up the flagyl from the pharmacy yet. She said she didn't want to take it if she didn't have to. Advised her that I would check on her sample tomorrow and see if its ready and will advise her about the tcs next week. She said she felt like she could handle doing the tcs. She said if she wasn't home tomorrow that it was ok to leave her a message.

## 2016-04-04 NOTE — Telephone Encounter (Signed)
See other phone note

## 2016-04-04 NOTE — Telephone Encounter (Signed)
[place] PATIENT ABOUT HER STOOL STUDIES, SHE IS ALSO MAKING SURE THAT HER PROCEDURE IS STILL ON FOR NEXT WEEK

## 2016-04-04 NOTE — Telephone Encounter (Signed)
Agree. If no active infection, would pursue colonoscopy as scheduled. Await stool results.

## 2016-04-05 NOTE — Telephone Encounter (Signed)
Gi pathogen panel negative. tcs as planned.

## 2016-04-05 NOTE — Telephone Encounter (Signed)
Stool sample came back negative. Spoke with LSL- pt ok to have procedure next week. I have called and informed the pt.

## 2016-04-10 ENCOUNTER — Ambulatory Visit (HOSPITAL_COMMUNITY)
Admission: RE | Admit: 2016-04-10 | Discharge: 2016-04-10 | Disposition: A | Discharge status: Home / Self Care | Payer: Medicare Other | Source: Ambulatory Visit | Attending: Internal Medicine | Admitting: Internal Medicine

## 2016-04-10 ENCOUNTER — Encounter (HOSPITAL_COMMUNITY)
Admission: RE | Disposition: A | Discharge status: Home / Self Care | Payer: Self-pay | Source: Ambulatory Visit | Attending: Internal Medicine

## 2016-04-10 ENCOUNTER — Encounter (HOSPITAL_COMMUNITY): Payer: Self-pay | Admitting: *Deleted

## 2016-04-10 DIAGNOSIS — I10 Essential (primary) hypertension: Secondary | ICD-10-CM | POA: Diagnosis not present

## 2016-04-10 DIAGNOSIS — Z79899 Other long term (current) drug therapy: Secondary | ICD-10-CM | POA: Insufficient documentation

## 2016-04-10 DIAGNOSIS — Z86010 Personal history of colonic polyps: Secondary | ICD-10-CM | POA: Diagnosis not present

## 2016-04-10 DIAGNOSIS — Z7982 Long term (current) use of aspirin: Secondary | ICD-10-CM | POA: Insufficient documentation

## 2016-04-10 DIAGNOSIS — Z7984 Long term (current) use of oral hypoglycemic drugs: Secondary | ICD-10-CM | POA: Diagnosis not present

## 2016-04-10 DIAGNOSIS — K5730 Diverticulosis of large intestine without perforation or abscess without bleeding: Secondary | ICD-10-CM | POA: Diagnosis not present

## 2016-04-10 DIAGNOSIS — E119 Type 2 diabetes mellitus without complications: Secondary | ICD-10-CM | POA: Diagnosis not present

## 2016-04-10 DIAGNOSIS — R140 Abdominal distension (gaseous): Secondary | ICD-10-CM

## 2016-04-10 DIAGNOSIS — E785 Hyperlipidemia, unspecified: Secondary | ICD-10-CM | POA: Insufficient documentation

## 2016-04-10 DIAGNOSIS — K219 Gastro-esophageal reflux disease without esophagitis: Secondary | ICD-10-CM | POA: Insufficient documentation

## 2016-04-10 DIAGNOSIS — Z1211 Encounter for screening for malignant neoplasm of colon: Secondary | ICD-10-CM | POA: Insufficient documentation

## 2016-04-10 HISTORY — PX: COLONOSCOPY: SHX5424

## 2016-04-10 LAB — GLUCOSE, CAPILLARY: Glucose-Capillary: 145 mg/dL — ABNORMAL HIGH (ref 65–99)

## 2016-04-10 SURGERY — COLONOSCOPY
Anesthesia: Moderate Sedation

## 2016-04-10 MED ORDER — MIDAZOLAM HCL 5 MG/5ML IJ SOLN
INTRAMUSCULAR | Status: AC
  Filled 2016-04-10: qty 10

## 2016-04-10 MED ORDER — MIDAZOLAM HCL 5 MG/5ML IJ SOLN
INTRAMUSCULAR | Status: DC | PRN
  Administered 2016-04-10: 1 mg via INTRAVENOUS
  Administered 2016-04-10 (×2): 2 mg via INTRAVENOUS

## 2016-04-10 MED ORDER — MEPERIDINE HCL 100 MG/ML IJ SOLN
INTRAMUSCULAR | Status: AC
  Filled 2016-04-10: qty 2

## 2016-04-10 MED ORDER — MEPERIDINE HCL 100 MG/ML IJ SOLN
INTRAMUSCULAR | Status: DC | PRN
  Administered 2016-04-10 (×3): 25 mg via INTRAVENOUS

## 2016-04-10 MED ORDER — SODIUM CHLORIDE 0.9 % IV SOLN
INTRAVENOUS | Status: DC
  Administered 2016-04-10: 1000 mL via INTRAVENOUS

## 2016-04-10 MED ORDER — ONDANSETRON HCL 4 MG/2ML IJ SOLN
INTRAMUSCULAR | Status: DC | PRN
  Administered 2016-04-10: 4 mg via INTRAVENOUS

## 2016-04-10 MED ORDER — ONDANSETRON HCL 4 MG/2ML IJ SOLN
INTRAMUSCULAR | Status: AC
  Filled 2016-04-10: qty 2

## 2016-04-10 NOTE — H&P (View-Only) (Signed)
Primary Care Physician:  Mickie Hillier, MD  Primary Gastroenterologist:  Garfield Cornea, MD   Chief Complaint  Patient presents with  . Colonoscopy    HPI:  Deborah Gray is a 83 y.o. female here for follow-up to schedule colonoscopy. She has a history of adenomatous colon polyps. Also with nutcracker esophagus, remote esophageal strictures, esophageal spasms improved on imipramine, idiopathic chronic pancreatitis, IBS. Requip UTIs, interstitial cystitis. EGD with reactive gastropathy earlier last year. Hydrogen breath test July 2017 negative.   She is having a lot of issues with upper abdominal discomfort, bloating, sensation she could not take a deep breath. Cardiac workup unremarkable last June. When she eats she feels a lot of gurgling and pressure building up and if she's able to pass gas she does get some relief. She feels like her colon is not emptying well or she has a blockage. Denies melena rectal bleeding. No diarrhea.   We did increase her pancreatic enzymes after last OV with some improvement of her symptoms.  Recently had the flu, complicated by sinusitis. Just completing antibiotics.   Current Outpatient Prescriptions  Medication Sig Dispense Refill  . acetaminophen (TYLENOL) 500 MG tablet Take 500 mg by mouth at bedtime.    Marland Kitchen aspirin 81 MG tablet Take 81 mg by mouth at bedtime.      Marland Kitchen azelastine (ASTELIN) 0.1 % nasal spray Place 1 spray into both nostrils as needed. Use in each nostril as directed     . benzonatate (TESSALON) 100 MG capsule Take 1 capsule (100 mg total) by mouth 3 (three) times daily as needed for cough. 45 capsule 3  . cephALEXin (KEFLEX) 250 MG capsule Take 250 mg by mouth 2 (two) times daily.  0  . chlorpheniramine-HYDROcodone (TUSSIONEX PENNKINETIC ER) 10-8 MG/5ML SUER Take 5 mLs by mouth at bedtime as needed for cough.    . ezetimibe (ZETIA) 10 MG tablet TAKE 1 TABLET (10 MG TOTAL) BY MOUTH DAILY. 90 tablet 2  . guaiFENesin (MUCINEX) 600 MG 12 hr  tablet Take 600 mg by mouth daily as needed.    Marland Kitchen imipramine (TOFRANIL) 10 MG tablet Take 3 tablets (30 mg total) by mouth at bedtime. (Patient taking differently: Take 10 mg by mouth at bedtime. ) 90 tablet 5  . Ketotifen Fumarate (THERA TEARS ALLERGY OP) Apply 1 drop to eye 2 (two) times daily.     . meclizine (ANTIVERT) 25 MG tablet TAKE 1 TABLET (25 MG TOTAL) BY MOUTH 3 (THREE) TIMES DAILY AS NEEDED FOR DIZZINESS OR NAUSEA. 30 tablet 1  . metFORMIN (GLUCOPHAGE) 500 MG tablet TAKE 1 TABLET (500 MG TOTAL) BY MOUTH 2 (TWO) TIMES DAILY. 180 tablet 1  . Meth-Hyo-M Bl-Na Phos-Ph Sal (URIBEL) 118 MG CAPS Take 1 capsule by mouth daily as needed.    . mirabegron ER (MYRBETRIQ) 25 MG TB24 tablet Take 25 mg by mouth daily.    . Multiple Vitamins-Minerals (PRESERVISION AREDS 2) CAPS Take 1 capsule by mouth 2 (two) times daily. Reported on 08/23/2015    . nateglinide (STARLIX) 120 MG tablet TAKE 1 TABLET BY MOUTH 3 TIMES A DAY BEFORE MEALS DUE 4-3 270 tablet 0  . nateglinide (STARLIX) 120 MG tablet TAKE 1 TABLET BY MOUTH 3 TIMES A DAY BEFORE MEALS DUE 4-3 270 tablet 1  . Omega-3 Fatty Acids (FISH OIL) 1200 MG CAPS Take 4 capsules by mouth daily.     Marland Kitchen omeprazole (PRILOSEC) 20 MG capsule Take 1 capsule (20 mg total) by mouth daily before  breakfast. 90 capsule 3  . Pancrelipase, Lip-Prot-Amyl, (CREON) 24000 units CPEP Take one capsule 3 times a day with meals and one capsule with snacks. (Patient taking differently: Take 2 capsules by mouth daily. Take two capsule 3 times a day with meals and two capsule with snacks.) 450 capsule 3  . Probiotic Product (ALIGN) 4 MG CAPS Take 1 capsule by mouth daily. Reported on 06/06/2015    . valsartan (DIOVAN) 80 MG tablet TAKE 1 TABLET (80 MG TOTAL) BY MOUTH DAILY. 90 tablet 1  . VITAMIN E PO Take 1 capsule by mouth 4 (four) times a week. Four times per week    . Vitamin Mixture (ESTER-C) 500-60 MG TABS Take 1 tablet by mouth 4 (four) times a week.     . Wheat Dextrin  (BENEFIBER PO) Take 1 packet by mouth daily.     . polyethylene glycol-electrolytes (TRILYTE) 420 g solution Take 4,000 mLs by mouth as directed. 4000 mL 0   No current facility-administered medications for this visit.     Allergies as of 03/11/2016 - Review Complete 03/11/2016  Allergen Reaction Noted  . Estrogens Other (See Comments) 10/15/2012  . Other  09/01/2013  . Sulfonamide derivatives Rash 03/01/2008    Past Medical History:  Diagnosis Date  . Arthritis   . Benign neoplasm of colon   . Constipation   . Diaphragmatic hernia without mention of obstruction or gangrene   . Diverticulosis of colon (without mention of hemorrhage)   . Dysphagia, pharyngoesophageal phase   . Esophageal reflux   . Essential hypertension   . Heart murmur   . Hemorrhoids   . Hyperlipidemia   . Internal hemorrhoids without mention of complication   . PONV (postoperative nausea and vomiting)   . Stricture and stenosis of esophagus   . Type 2 diabetes mellitus ([place])   . Unspecified gastritis and gastroduodenitis without mention of hemorrhage     Past Surgical History:  Procedure Laterality Date  . ABDOMINAL HYSTERECTOMY    . BACTERIAL OVERGROWTH TEST N/A 09/06/2015   Procedure: BACTERIAL OVERGROWTH TEST;  Surgeon: Daneil Dolin, MD;  Location: [facility];  Service: Endoscopy;  Laterality: N/A;  800  . BREAST LUMPECTOMY Right    benign  . CATARACT EXTRACTION Bilateral 2006   Implants in both, surgeries done 6 weeks apart  . CHOLECYSTECTOMY    . COLONOSCOPY  03/08/02   [name]: diverticulosis, internal hemorrhoids, adenomatous colon polyp  . COLONOSCOPY  01/23/04   [name]: diverticulosis, internal hemorrhoids  . COLONOSCOPY  09/25/05   [name]: diverticulosis  . COLONOSCOPY  07/05/09   [name]: severe diverticulosis  . COLONOSCOPY  01/21/11   [name]: severe diverticulosis in sigmoid to desc colon, int hemorrhoids, follow up TCS in 5 years  . DILATION AND CURETTAGE OF UTERUS     . ESOPHAGEAL MANOMETRY  03/21/08   Patterson: findings c/w Nutcracker Esophagus  . ESOPHAGOGASTRODUODENOSCOPY  03/08/02   [name]: esophageal stricture, chronic gerd, s/p dilation.   . ESOPHAGOGASTRODUODENOSCOPY  01/23/04   [name]: esophageal stricture, gastritis, hiatal hernia  . ESOPHAGOGASTRODUODENOSCOPY  09/25/05   Patterson: gastritis, benign bx, no H.pylori  . ESOPHAGOGASTRODUODENOSCOPY  07/05/09   Patterson: gastropathy, benign small bowel bx and gastric bx  . ESOPHAGOGASTRODUODENOSCOPY N/A 05/15/2015   Dr. Gala Romney- diffuse moderate inflammation characterized by congestion (edema), erythema, and linear erosions was found in the entire examined stomach. bx= reactive gastropathy  . FOOT SURGERY Left   . HEMORRHOID BANDING  2017   Dr.[name]  . LAPAROSCOPIC  VAGINAL HYSTERECTOMY    . RECTOCELE REPAIR    . TOTAL KNEE ARTHROPLASTY Right     Family History  Problem Relation Age of Onset  . Ovarian cancer Mother   . Diabetes Father   . Heart disease Father   . Breast cancer Sister   . Liver cancer Sister   . Colon cancer Cousin     Paternal side  . Diabetes Maternal Aunt   . Liver disease Cousin     Maternal side, never drank    Social History   Social History  . Marital status: Married    Spouse name: N/A  . Number of children: 3  . Years of education: N/A   Occupational History  . Retired    Social History Main Topics  . Smoking status: Never Smoker  . Smokeless tobacco: Never Used     Comment: Never smoked  . Alcohol use No  . Drug use: No  . Sexual activity: Not on file   Other Topics Concern  . Not on file   Social History Narrative  . No narrative on file      ROS:  General: Negative for anorexia, weight loss, fever, chills, fatigue, weakness. Eyes: Negative for vision changes.  ENT: Negative for hoarseness, difficulty swallowing , nasal congestion. CV: Negative for chest pain, angina, palpitations, dyspnea on exertion, peripheral edema.   Respiratory: Negative for dyspnea at rest, dyspnea on exertion, cough, sputum, wheezing.  GI: See history of present illness. GU:  Negative for dysuria, hematuria, urinary incontinence, urinary frequency, nocturnal urination.  MS: Negative for joint pain, low back pain.  Derm: Negative for rash or itching.  Neuro: Negative for weakness, abnormal sensation, seizure, frequent headaches, memory loss, confusion.  Psych: Negative for anxiety, depression, suicidal ideation, hallucinations.  Endo: Negative for unusual weight change.  Heme: Negative for bruising or bleeding. Allergy: Negative for rash or hives.    Physical Examination:  BP 126/65   Pulse 84   Temp 98.4 F (36.9 C) (Oral)   Ht 5\' 4"  (1.626 m)   Wt 164 lb (74.4 kg)   BMI 28.15 kg/m    General: Well-nourished, well-developed in no acute distress.  Head: Normocephalic, atraumatic.   Eyes: Conjunctiva pink, no icterus. Mouth: Oropharyngeal mucosa moist and pink , no lesions erythema or exudate. Neck: Supple without thyromegaly, masses, or lymphadenopathy.  Lungs: Clear to auscultation bilaterally.  Heart: Regular rate and rhythm, no murmurs rubs or gallops.  Abdomen: Bowel sounds are normal, nontender, nondistended, no hepatosplenomegaly or masses, no abdominal bruits or    hernia , no rebound or guarding.   Rectal: deferred Extremities: No lower extremity edema. No clubbing or deformities.  Neuro: Alert and oriented x 4 , grossly normal neurologically.  Skin: Warm and dry, no rash or jaundice.   Psych: Alert and cooperative, normal mood and affect.  Labs: Lab Results  Component Value Date   CREATININE 0.90 12/18/2015   BUN 19 12/18/2015   NA 144 12/18/2015   K 5.4 (H) 12/18/2015   CL 98 12/18/2015   CO2 26 12/18/2015   Lab Results  Component Value Date   ALT 17 12/18/2015   AST 15 12/18/2015   ALKPHOS 83 12/18/2015   BILITOT 0.4 12/18/2015   Lab Results  Component Value Date   WBC 7.4 01/22/2016   HGB  13.6 08/16/2015   HCT 40.9 01/22/2016   MCV 88 01/22/2016   PLT 248 01/22/2016     Imaging Studies: Dg Chest 2 View  Result Date: 02/25/2016 CLINICAL DATA:  Cough, flu symptoms, hypertension, diabetes, remote breast surgery EXAM: CHEST  2 VIEW COMPARISON:  08/16/2015 FINDINGS: Normal heart size and vascularity. Mild hyperinflation and parenchymal scarring suspect COPD/emphysema. No focal pneumonia, collapse or consolidation. Negative for edema, effusion or pneumothorax. Trachea is midline. Atherosclerosis of the aorta and minor scoliosis of the spine. Remote cholecystectomy noted. IMPRESSION: Stable hyperinflation, suspect COPD/emphysema. No superimposed acute chest process Thoracic aortic atherosclerosis Electronically Signed   By: Jerilynn Mages.  Shick M.D.   On: 02/25/2016 11:11

## 2016-04-10 NOTE — Op Note (Signed)
[facility] Patient Name: Deborah Gray Procedure Date: 04/10/2016 11:48 AM MRN: [id number] Date of Birth: April 16, 1933 Attending MD: Norvel Richards , MD CSN: [id number] Age: 83 Admit Type: Outpatient Procedure:                Colonoscopy - [place] Indications:              High risk colon cancer surveillance: Personal                            history of colonic polyps Providers:                Norvel Richards, MD, Janeece Riggers, RN, Isabella Stalling, Technician Referring MD:             Rosemary Holms, MD Medicines:                Midazolam 5 mg IV, Meperidine 75 mg IV, Ondansetron                            4 mg IV Complications:            No immediate complications. Estimated Blood Loss:     Estimated blood loss: none. Procedure:                Pre-Anesthesia Assessment:                           - Prior to the procedure, a History and Physical                            was performed, and patient medications and                            allergies were reviewed. The patient's tolerance of                            previous anesthesia was also reviewed. The risks                            and benefits of the procedure and the sedation                            options and risks were discussed with the patient.                            All questions were answered, and informed consent                            was obtained. Prior Anticoagulants: The patient has                            taken no previous anticoagulant or antiplatelet  agents. ASA Grade Assessment: II - A patient with                            mild systemic disease. After reviewing the risks                            and benefits, the patient was deemed in                            satisfactory condition to undergo the procedure.                           After obtaining informed consent, the colonoscope   was passed under direct vision. Throughout the                            procedure, the patient's blood pressure, pulse, and                            oxygen saturations were monitored continuously. The                            [id number]) scope was introduced through                            the anus and advanced to the the cecum, identified                            by appendiceal orifice and ileocecal valve. The                            ileocecal valve, appendiceal orifice, and rectum                            were photographed. The ileocecal valve, appendiceal                            orifice, and rectum were photographed. The                            colonoscopy was performed without difficulty. The                            colonoscopy was performed without difficulty. The                            entire colon was well visualized. The quality of                            the bowel preparation was adequate. Scope In: 12:55:26 PM Scope Out: 1:15:34 PM Scope Withdrawal Time: 0 hours 9 minutes 17 seconds  Total Procedure Duration: 0 hours 20 minutes 8 seconds  Findings:      The perianal and digital rectal examinations were normal.  Multiple small and large-mouthed diverticula were found in the entire       colon.      The exam was otherwise without abnormality. Rectal vault small. Unable       to retroflex. Rectal mucosa seen well on?"face. Impression:               - Diverticulosis in the entire examined colon.                           - The examination was otherwise normal.                           - No specimens collected. Moderate Sedation:      Moderate (conscious) sedation was administered by the endoscopy nurse       and supervised by the endoscopist. The following parameters were       monitored: oxygen saturation, heart rate, blood pressure, respiratory       rate, EKG, adequacy of pulmonary ventilation, and response to care.       Total  physician intraservice time was 30 minutes. Recommendation:           - Patient has a contact number available for                            emergencies. The signs and symptoms of potential                            delayed complications were discussed with the                            patient. Return to normal activities tomorrow.                            Written discharge instructions were provided to the                            patient.                           - Resume previous diet.                           - Continue present medications.                           - No repeat colonoscopy due to age.                           - Return to GI office PRN. Procedure Code(s):        --- Professional ---                           [phone], Colonoscopy, flexible; diagnostic, including                            collection of specimen(s) by brushing or washing,  when performed (separate procedure)                           99152, Moderate sedation services provided by the                            same physician or other qualified health care                            professional performing the diagnostic or                            therapeutic service that the sedation supports,                            requiring the presence of an independent trained                            observer to assist in the monitoring of the                            patient's level of consciousness and physiological                            status; initial 15 minutes of intraservice time,                            patient age 51 years or older                           [phone], Moderate sedation services; each additional                            15 minutes intraservice time Diagnosis Code(s):        --- Professional ---                           Z86.010, Personal history of colonic polyps                           K57.30, Diverticulosis of large intestine without                             perforation or abscess without bleeding CPT copyright 2016 American Medical Association. All rights reserved. The codes documented in this report are preliminary and upon coder review may  be revised to meet current compliance requirements. Cristopher Estimable. Kimberle Stanfill, MD Norvel Richards, MD 04/10/2016 1:22:50 PM This report has been signed electronically. Number of Addenda: 0

## 2016-04-10 NOTE — Discharge Instructions (Addendum)
Colonoscopy Discharge Instructions  Read the instructions outlined below and refer to this sheet in the next few weeks. These discharge instructions provide you with general information on caring for yourself after you leave the hospital. Your doctor may also give you specific instructions. While your treatment has been planned according to the most current medical practices available, unavoidable complications occasionally occur. If you have any problems or questions after discharge, call Dr. Gala Gray at [phone]. ACTIVITY  You may resume your regular activity, but move at a slower pace for the next 24 hours.   Take frequent rest periods for the next 24 hours.   Walking will help get rid of the air and reduce the bloated feeling in your belly (abdomen).   No driving for 24 hours (because of the medicine (anesthesia) used during the test).    Do not sign any important legal documents or operate any machinery for 24 hours (because of the anesthesia used during the test).  NUTRITION  Drink plenty of fluids.   You may resume your normal diet as instructed by your doctor.   Begin with a light meal and progress to your normal diet. Heavy or fried foods are harder to digest and may make you feel sick to your stomach (nauseated).   Avoid alcoholic beverages for 24 hours or as instructed.  MEDICATIONS  You may resume your normal medications unless your doctor tells you otherwise.  WHAT YOU CAN EXPECT TODAY  Some feelings of bloating in the abdomen.   Passage of more gas than usual.   Spotting of blood in your stool or on the toilet paper.  IF YOU HAD POLYPS REMOVED DURING THE COLONOSCOPY:  No aspirin products for 7 days or as instructed.   No alcohol for 7 days or as instructed.   Eat a soft diet for the next 24 hours.  FINDING OUT THE RESULTS OF YOUR TEST Not all test results are available during your visit. If your test results are not back during the visit, make an appointment  with your caregiver to find out the results. Do not assume everything is normal if you have not heard from your caregiver or the medical facility. It is important for you to follow up on all of your test results.  SEEK IMMEDIATE MEDICAL ATTENTION IF:  You have more than a spotting of blood in your stool.   Your belly is swollen (abdominal distention).   You are nauseated or vomiting.   You have a temperature over 101.   You have abdominal pain or discomfort that is severe or gets worse throughout the day.    Colon diverticulosis information provided  I do not recommend a future colonoscopy unless new symptoms develop     Diverticulosis Diverticulosis is the condition that develops when small pouches (diverticula) form in the wall of your colon. Your colon, or large intestine, is where water is absorbed and stool is formed. The pouches form when the inside layer of your colon pushes through weak spots in the outer layers of your colon. CAUSES  No one knows exactly what causes diverticulosis. RISK FACTORS  Being older than 77. Your risk for this condition increases with age. Diverticulosis is rare in people younger than 40 years. By age 55, almost everyone has it.  Eating a low-fiber diet.  Being frequently constipated.  Being overweight.  Not getting enough exercise.  Smoking.  Taking over-the-counter pain medicines, like aspirin and ibuprofen. SYMPTOMS  Most people with diverticulosis do not have  symptoms. DIAGNOSIS  Because diverticulosis often has no symptoms, health care providers often discover the condition during an exam for other colon problems. In many cases, a health care provider will diagnose diverticulosis while using a flexible scope to examine the colon (colonoscopy). TREATMENT  If you have never developed an infection related to diverticulosis, you may not need treatment. If you have had an infection before, treatment may include:  Eating more fruits,  vegetables, and grains.  Taking a fiber supplement.  Taking a live bacteria supplement (probiotic).  Taking medicine to relax your colon. HOME CARE INSTRUCTIONS   Drink at least 6-8 glasses of water each day to prevent constipation.  Try not to strain when you have a bowel movement.  Keep all follow-up appointments. If you have had an infection before:  Increase the fiber in your diet as directed by your health care provider or dietitian.  Take a dietary fiber supplement if your health care provider approves.  Only take medicines as directed by your health care provider. SEEK MEDICAL CARE IF:   You have abdominal pain.  You have bloating.  You have cramps.  You have not gone to the bathroom in 3 days. SEEK IMMEDIATE MEDICAL CARE IF:   Your pain gets worse.  Yourbloating becomes very bad.  You have a fever or chills, and your symptoms suddenly get worse.  You begin vomiting.  You have bowel movements that are bloody or black. MAKE SURE YOU:  Understand these instructions.  Will watch your condition.  Will get help right away if you are not doing well or get worse. This information is not intended to replace advice given to you by your health care provider. Make sure you discuss any questions you have with your health care provider. Document Released: 11/02/2003 Document Revised: 02/09/2013 Document Reviewed: 12/30/2012 Elsevier Interactive Patient Education  2017 [employer].

## 2016-04-10 NOTE — Interval H&P Note (Signed)
History and Physical Interval Note:  04/10/2016 12:43 PM  Deborah Gray  has presented today for surgery, with the diagnosis of History colon polyps, bloating  The various methods of treatment have been discussed with the patient and family. After consideration of risks, benefits and other options for treatment, the patient has consented to  Procedure(s) with comments: COLONOSCOPY (N/A) - 12:30 PM as a surgical intervention .  The patient's history has been reviewed, patient examined, no change in status, stable for surgery.  I have reviewed the patient's chart and labs.  Questions were answered to the patient's satisfaction.     Deborah Gray  No change. Surveillance colonoscopy per plan.  The risks, benefits, limitations, alternatives and imponderables have been reviewed with the patient. Questions have been answered. All parties are agreeable.

## 2016-04-11 ENCOUNTER — Encounter (HOSPITAL_COMMUNITY): Payer: Self-pay | Admitting: Internal Medicine

## 2016-04-17 ENCOUNTER — Ambulatory Visit (INDEPENDENT_AMBULATORY_CARE_PROVIDER_SITE_OTHER): Payer: Medicare Other | Admitting: Urology

## 2016-04-17 DIAGNOSIS — N3000 Acute cystitis without hematuria: Secondary | ICD-10-CM

## 2016-04-19 DIAGNOSIS — E119 Type 2 diabetes mellitus without complications: Secondary | ICD-10-CM | POA: Diagnosis not present

## 2016-04-22 ENCOUNTER — Other Ambulatory Visit: Payer: Self-pay | Admitting: Family Medicine

## 2016-05-03 DIAGNOSIS — M67911 Unspecified disorder of synovium and tendon, right shoulder: Secondary | ICD-10-CM | POA: Diagnosis not present

## 2016-05-14 ENCOUNTER — Other Ambulatory Visit (HOSPITAL_COMMUNITY)
Admission: RE | Admit: 2016-05-14 | Discharge: 2016-05-14 | Disposition: A | Discharge status: Home / Self Care | Payer: Medicare Other | Source: Other Acute Inpatient Hospital | Attending: Urology | Admitting: Urology

## 2016-05-14 ENCOUNTER — Ambulatory Visit (INDEPENDENT_AMBULATORY_CARE_PROVIDER_SITE_OTHER): Payer: Medicare Other | Admitting: Urology

## 2016-05-14 DIAGNOSIS — N3000 Acute cystitis without hematuria: Secondary | ICD-10-CM

## 2016-05-14 DIAGNOSIS — N390 Urinary tract infection, site not specified: Secondary | ICD-10-CM | POA: Insufficient documentation

## 2016-05-15 DIAGNOSIS — H353221 Exudative age-related macular degeneration, left eye, with active choroidal neovascularization: Secondary | ICD-10-CM | POA: Diagnosis not present

## 2016-05-16 LAB — URINE CULTURE: Culture: NO GROWTH

## 2016-05-20 ENCOUNTER — Other Ambulatory Visit: Payer: Self-pay | Admitting: Family Medicine

## 2016-05-20 ENCOUNTER — Other Ambulatory Visit: Payer: Self-pay | Admitting: Gastroenterology

## 2016-05-20 DIAGNOSIS — H43812 Vitreous degeneration, left eye: Secondary | ICD-10-CM | POA: Diagnosis not present

## 2016-05-20 NOTE — Telephone Encounter (Signed)
Ok plus 2 ref 

## 2016-05-20 NOTE — Telephone Encounter (Signed)
Patient last seen November 2017

## 2016-05-20 NOTE — Telephone Encounter (Signed)
Can we find out the exact dosing of Creon? It's not clear.

## 2016-05-21 ENCOUNTER — Telehealth: Payer: Self-pay | Admitting: Internal Medicine

## 2016-05-21 ENCOUNTER — Ambulatory Visit (INDEPENDENT_AMBULATORY_CARE_PROVIDER_SITE_OTHER): Payer: Medicare Other | Admitting: Urology

## 2016-05-21 DIAGNOSIS — N3000 Acute cystitis without hematuria: Secondary | ICD-10-CM | POA: Diagnosis not present

## 2016-05-21 NOTE — Telephone Encounter (Signed)
Tried to call pt- NA- LMOM 

## 2016-05-21 NOTE — Telephone Encounter (Signed)
Pt said she takes 2 with meals and 1 with snacks. #7 a day, she would like #90 supply

## 2016-05-21 NOTE — Telephone Encounter (Signed)
Spoke with the pt, see refill request note.

## 2016-05-21 NOTE — Telephone Encounter (Signed)
Pt called to say that she needed a refill on her creon and needed a 90 day supply and she takes it 7 times a day. [facility] in [place]

## 2016-05-22 ENCOUNTER — Other Ambulatory Visit: Payer: Self-pay | Admitting: Gastroenterology

## 2016-05-22 MED ORDER — PANCRELIPASE (LIP-PROT-AMYL) 24000-76000 UNITS PO CPEP: ORAL_CAPSULE | ORAL | 3 refills | Status: DC

## 2016-05-28 ENCOUNTER — Ambulatory Visit (INDEPENDENT_AMBULATORY_CARE_PROVIDER_SITE_OTHER): Payer: Medicare Other | Admitting: Urology

## 2016-05-28 ENCOUNTER — Other Ambulatory Visit (HOSPITAL_COMMUNITY)
Admission: RE | Admit: 2016-05-28 | Discharge: 2016-05-28 | Disposition: A | Discharge status: Home / Self Care | Payer: Medicare Other | Source: Ambulatory Visit | Attending: Urology | Admitting: Urology

## 2016-05-28 DIAGNOSIS — N390 Urinary tract infection, site not specified: Secondary | ICD-10-CM | POA: Insufficient documentation

## 2016-05-28 DIAGNOSIS — R300 Dysuria: Secondary | ICD-10-CM | POA: Diagnosis not present

## 2016-05-31 LAB — URINE CULTURE: Culture: >=100000 — AB

## 2016-06-04 DIAGNOSIS — E119 Type 2 diabetes mellitus without complications: Secondary | ICD-10-CM | POA: Diagnosis not present

## 2016-06-04 DIAGNOSIS — H353221 Exudative age-related macular degeneration, left eye, with active choroidal neovascularization: Secondary | ICD-10-CM | POA: Diagnosis not present

## 2016-06-13 ENCOUNTER — Telehealth: Payer: Self-pay | Admitting: Nurse Practitioner

## 2016-06-13 DIAGNOSIS — E119 Type 2 diabetes mellitus without complications: Secondary | ICD-10-CM

## 2016-06-13 DIAGNOSIS — I10 Essential (primary) hypertension: Secondary | ICD-10-CM

## 2016-06-13 DIAGNOSIS — E785 Hyperlipidemia, unspecified: Secondary | ICD-10-CM

## 2016-06-13 NOTE — Telephone Encounter (Signed)
Pt is requesting lab orders to be sent over for an upcoming appt. Last labs per epic were: a1c,bmp,hepatic,and lipid on 12/18/15.

## 2016-06-16 NOTE — Telephone Encounter (Signed)
Rep same plus urinme protein analysis

## 2016-06-17 NOTE — Telephone Encounter (Signed)
Spoke with patient and informed her per Dr.Steve Courtland were ordered for upcoming appointment. Patient verbalized understanding.

## 2016-06-18 ENCOUNTER — Ambulatory Visit (INDEPENDENT_AMBULATORY_CARE_PROVIDER_SITE_OTHER): Payer: Medicare Other | Admitting: Urology

## 2016-06-18 DIAGNOSIS — N3000 Acute cystitis without hematuria: Secondary | ICD-10-CM | POA: Diagnosis not present

## 2016-06-19 DIAGNOSIS — I10 Essential (primary) hypertension: Secondary | ICD-10-CM | POA: Diagnosis not present

## 2016-06-19 DIAGNOSIS — E119 Type 2 diabetes mellitus without complications: Secondary | ICD-10-CM | POA: Diagnosis not present

## 2016-06-19 DIAGNOSIS — E785 Hyperlipidemia, unspecified: Secondary | ICD-10-CM | POA: Diagnosis not present

## 2016-06-20 LAB — HEPATIC FUNCTION PANEL
ALT: 14 IU/L (ref 0–32)
AST: 13 IU/L (ref 0–40)
Albumin: 4.4 g/dL (ref 3.5–4.7)
Alkaline Phosphatase: 70 IU/L (ref 39–117)
Bilirubin Total: 0.4 mg/dL (ref 0.0–1.2)
Bilirubin, Direct: 0.12 mg/dL (ref 0.00–0.40)
TOTAL PROTEIN: 6.9 g/dL (ref 6.0–8.5)

## 2016-06-20 LAB — LIPID PANEL
CHOLESTEROL TOTAL: 187 mg/dL (ref 100–199)
Chol/HDL Ratio: 3.6 ratio (ref 0.0–4.4)
HDL: 52 mg/dL (ref >39)
LDL CALC: 111 mg/dL — AB (ref 0–99)
TRIGLYCERIDES: 121 mg/dL (ref 0–149)
VLDL CHOLESTEROL CAL: 24 mg/dL (ref 5–40)

## 2016-06-20 LAB — BASIC METABOLIC PANEL
BUN / CREAT RATIO: 27 (ref 12–28)
BUN: 20 mg/dL (ref 8–27)
CHLORIDE: 103 mmol/L (ref 96–106)
CO2: 25 mmol/L (ref 18–29)
Calcium: 10.2 mg/dL (ref 8.7–10.3)
Creatinine, Ser: 0.73 mg/dL (ref 0.57–1.00)
GFR calc non Af Amer: 78 mL/min/{1.73_m2} (ref >59)
GFR, EST AFRICAN AMERICAN: 90 mL/min/{1.73_m2} (ref >59)
Glucose: 138 mg/dL — ABNORMAL HIGH (ref 65–99)
POTASSIUM: 5 mmol/L (ref 3.5–5.2)
Sodium: 143 mmol/L (ref 134–144)

## 2016-06-20 LAB — MICROALBUMIN / CREATININE URINE RATIO
CREATININE, UR: 76.7 mg/dL
Microalb/Creat Ratio: 11.3 mg/g creat (ref 0.0–30.0)
Microalbumin, Urine: 8.7 ug/mL

## 2016-06-20 LAB — HEMOGLOBIN A1C
ESTIMATED AVERAGE GLUCOSE: 140 mg/dL
HEMOGLOBIN A1C: 6.5 % — AB (ref 4.8–5.6)

## 2016-06-24 ENCOUNTER — Ambulatory Visit: Payer: Medicare Other | Admitting: Nurse Practitioner

## 2016-06-26 ENCOUNTER — Ambulatory Visit (INDEPENDENT_AMBULATORY_CARE_PROVIDER_SITE_OTHER): Payer: Medicare Other | Admitting: Nurse Practitioner

## 2016-06-26 ENCOUNTER — Encounter: Payer: Self-pay | Admitting: Nurse Practitioner

## 2016-06-26 VITALS — BP 130/78 | Ht 64.0 in | Wt 162.6 lb

## 2016-06-26 DIAGNOSIS — E119 Type 2 diabetes mellitus without complications: Secondary | ICD-10-CM

## 2016-06-26 DIAGNOSIS — I10 Essential (primary) hypertension: Secondary | ICD-10-CM

## 2016-06-26 MED ORDER — AZELASTINE HCL 0.1 % NA SOLN: 1.0000 | NASAL | 11 refills | Status: DC | PRN

## 2016-06-26 NOTE — Progress Notes (Signed)
Subjective:  Presents for recheck on her diabetes and to discuss her recent lab work. Has tried to cut back on her calorie intake particularly fatty foods. No chest pain/ischemic type pain or shortness of breath. Active lifestyle. Minimal pain in the feet only if she is standing for long periods of time. Continues follow-up with her specialists.   Objective:   BP 130/78   Ht 5\' 4"  (1.626 m)   Wt 162 lb 9.6 oz (73.8 kg)   BMI 27.91 kg/m  NAD. Alert, oriented. Lungs clear. Heart regular rate rhythm. Results for orders placed or performed in visit on 06/13/16  Hemoglobin A1C  Result Value Ref Range   Hgb A1c MFr Bld 6.5 (H) 4.8 - 5.6 %   Est. average glucose Bld gHb Est-mCnc 140 mg/dL  Basic metabolic panel  Result Value Ref Range   Glucose 138 (H) 65 - 99 mg/dL   BUN 20 8 - 27 mg/dL   Creatinine, Ser 0.73 0.57 - 1.00 mg/dL   GFR calc non Af Amer 78 >59 mL/min/1.73   GFR calc Af Amer 90 >59 mL/min/1.73   BUN/Creatinine Ratio 27 12 - 28   Sodium 143 134 - 144 mmol/L   Potassium 5.0 3.5 - 5.2 mmol/L   Chloride 103 96 - 106 mmol/L   CO2 25 18 - 29 mmol/L   Calcium 10.2 8.7 - 10.3 mg/dL  Hepatic function panel  Result Value Ref Range   Total Protein 6.9 6.0 - 8.5 g/dL   Albumin 4.4 3.5 - 4.7 g/dL   Bilirubin Total 0.4 0.0 - 1.2 mg/dL   Bilirubin, Direct 0.12 0.00 - 0.40 mg/dL   Alkaline Phosphatase 70 39 - 117 IU/L   AST 13 0 - 40 IU/L   ALT 14 0 - 32 IU/L  Lipid panel  Result Value Ref Range   Cholesterol, Total 187 100 - 199 mg/dL   Triglycerides 121 0 - 149 mg/dL   HDL 52 >39 mg/dL   VLDL Cholesterol Cal 24 5 - 40 mg/dL   LDL Calculated 111 (H) 0 - 99 mg/dL   Chol/HDL Ratio 3.6 0.0 - 4.4 ratio  Microalbumin / creatinine urine ratio  Result Value Ref Range   Creatinine, Urine 76.7 Not Estab. mg/dL   Albumin, Urine 8.7 Not Estab. ug/mL   Microalb/Creat Ratio 11.3 0.0 - 30.0 mg/g creat    Diabetic Foot Exam - Simple   Simple Foot Form Visual Inspection No deformities,  no ulcerations, no other skin breakdown bilaterally:  Yes Sensation Testing Intact to touch and monofilament testing bilaterally:  Yes Pulse Check See comments:  Yes Comments DP pulses present bilat; mild localized edema at lateral malleolus bilat. Significant varicose veins along lower legs and feet; toes warm.      Assessment:   Problem List Items Addressed This Visit      Cardiovascular and Mediastinum   Essential hypertension - Primary     Endocrine   Type 2 diabetes mellitus (Deborah Gray)       Plan:   Meds ordered this encounter  Medications  . azelastine (ASTELIN) 0.1 % nasal spray    Sig: Place 1 spray into both nostrils as needed. Use in each nostril as directed    Dispense:  30 mL    Refill:  11    Order Specific Question:   Supervising Provider    Answer:   [name] [[id number]]   Continue current medications as directed. Patient has had her eye exam. Encouraged continued activity  and healthy diet. Return in about 6 months (around 12/27/2016) for check up.

## 2016-07-02 DIAGNOSIS — H353221 Exudative age-related macular degeneration, left eye, with active choroidal neovascularization: Secondary | ICD-10-CM | POA: Diagnosis not present

## 2016-07-09 ENCOUNTER — Ambulatory Visit (INDEPENDENT_AMBULATORY_CARE_PROVIDER_SITE_OTHER): Payer: Medicare Other | Admitting: Urology

## 2016-07-09 ENCOUNTER — Other Ambulatory Visit (HOSPITAL_COMMUNITY)
Admission: RE | Admit: 2016-07-09 | Discharge: 2016-07-09 | Disposition: A | Discharge status: Home / Self Care | Payer: Medicare Other | Source: Other Acute Inpatient Hospital | Attending: Urology | Admitting: Urology

## 2016-07-09 DIAGNOSIS — N3000 Acute cystitis without hematuria: Secondary | ICD-10-CM | POA: Insufficient documentation

## 2016-07-12 LAB — URINE CULTURE: Culture: >=100000 — AB

## 2016-07-16 ENCOUNTER — Ambulatory Visit (INDEPENDENT_AMBULATORY_CARE_PROVIDER_SITE_OTHER): Payer: Medicare Other | Admitting: Urology

## 2016-07-16 DIAGNOSIS — N3000 Acute cystitis without hematuria: Secondary | ICD-10-CM | POA: Diagnosis not present

## 2016-07-17 ENCOUNTER — Telehealth: Payer: Self-pay | Admitting: Internal Medicine

## 2016-07-17 NOTE — Telephone Encounter (Signed)
Routing to the refill box for refills.

## 2016-07-17 NOTE — Telephone Encounter (Signed)
PLEASE CALL PATIENT REGARDING HER CREON PRESCRIPTION.  WAS ADVISED TO TAKE MORE THAN IS ON THE PRESCRIPTION SHE IS RUNNING OUT OF.  NEEDS 8 A DAY FOR 90 DAYS  [phone]

## 2016-07-18 NOTE — Telephone Encounter (Signed)
Tried to call pt- NA-LMOM asked her to call back and let us know how many a day she needs and the correct pharmacy or mail order.

## 2016-07-18 NOTE — Telephone Encounter (Signed)
How many meals a day and how many snacks a day does she typically eat?  I will send in the refill but want to make sure I give her enough.

## 2016-07-19 MED ORDER — PANCRELIPASE (LIP-PROT-AMYL) 24000-76000 UNITS PO CPEP: ORAL_CAPSULE | ORAL | 5 refills | Status: DC

## 2016-07-19 NOTE — Telephone Encounter (Signed)
Pt called back, she said she normally takes 7 in a whole day but occasionally if she eats a large meal she will take an extra creon. She would like an rx for 7 a day plus about 15 extra a month for those days she has to take an extra one.

## 2016-07-19 NOTE — Addendum Note (Signed)
Addended by: Gordy Levan, ERIC A on: 07/19/2016 12:50 PM   Modules accepted: Orders

## 2016-07-19 NOTE — Telephone Encounter (Signed)
Please tell the patient the Rx was sent to the pharmacy for 6 months worth averaging 8 a day.

## 2016-07-20 ENCOUNTER — Other Ambulatory Visit: Payer: Self-pay | Admitting: Gastroenterology

## 2016-07-21 ENCOUNTER — Encounter (HOSPITAL_COMMUNITY): Payer: Self-pay | Admitting: Emergency Medicine

## 2016-07-21 ENCOUNTER — Emergency Department (HOSPITAL_COMMUNITY)
Admission: EM | Admit: 2016-07-21 | Discharge: 2016-07-21 | Disposition: A | Discharge status: Home / Self Care | Payer: Medicare Other | Attending: Emergency Medicine | Admitting: Emergency Medicine

## 2016-07-21 DIAGNOSIS — E119 Type 2 diabetes mellitus without complications: Secondary | ICD-10-CM | POA: Insufficient documentation

## 2016-07-21 DIAGNOSIS — I10 Essential (primary) hypertension: Secondary | ICD-10-CM | POA: Insufficient documentation

## 2016-07-21 DIAGNOSIS — N3000 Acute cystitis without hematuria: Secondary | ICD-10-CM

## 2016-07-21 DIAGNOSIS — Z79899 Other long term (current) drug therapy: Secondary | ICD-10-CM | POA: Diagnosis not present

## 2016-07-21 DIAGNOSIS — Z7982 Long term (current) use of aspirin: Secondary | ICD-10-CM | POA: Insufficient documentation

## 2016-07-21 DIAGNOSIS — Z7984 Long term (current) use of oral hypoglycemic drugs: Secondary | ICD-10-CM | POA: Insufficient documentation

## 2016-07-21 DIAGNOSIS — R300 Dysuria: Secondary | ICD-10-CM | POA: Diagnosis present

## 2016-07-21 LAB — URINALYSIS, ROUTINE W REFLEX MICROSCOPIC
BILIRUBIN URINE: NEGATIVE
Glucose, UA: NEGATIVE mg/dL
KETONES UR: NEGATIVE mg/dL
Nitrite: POSITIVE — AB
PROTEIN: NEGATIVE mg/dL
SPECIFIC GRAVITY, URINE: 1.006 (ref 1.005–1.030)
pH: 6 (ref 5.0–8.0)

## 2016-07-21 MED ORDER — CIPROFLOXACIN HCL 500 MG PO TABS: 500.0000 mg | ORAL_TABLET | Freq: Two times a day (BID) | ORAL | 0 refills | Status: DC

## 2016-07-21 NOTE — ED Triage Notes (Signed)
Pt here because she is not sure if she has a UTI or if it's her interstitial cystitis.

## 2016-07-21 NOTE — Discharge Instructions (Signed)
See your Physician for recheck.  Your urine culture is pending

## 2016-07-21 NOTE — ED Provider Notes (Signed)
[facility] DEPT Provider Note   CSN: [id number] Arrival date & time: 07/21/16  2105     History   Chief Complaint Chief Complaint  Patient presents with  . Dysuria    HPI Deborah Gray is a 83 y.o. female.  The history is provided by the patient. No language interpreter was used.  Dysuria   This is a recurrent problem. The current episode started more than 2 days ago. The problem occurs every urination. The problem has been gradually worsening. The quality of the pain is described as burning. There has been no fever. Pertinent negatives include no chills and no vomiting. She has tried home medications for the symptoms. Her past medical history is significant for urological procedure and recurrent UTIs.  Pt recently finished treatment for a uti.  Pt has a history of Interstitial cystitis.  Pt reports she has monthly treatments.  Pt reports most recent culture was sensitive to cipro and resistant to macrobid.  Pt denies fever, no nausea or vomiting  Past Medical History:  Diagnosis Date  . Arthritis   . Benign neoplasm of colon   . Constipation   . Diaphragmatic hernia without mention of obstruction or gangrene   . Diverticulosis of colon (without mention of hemorrhage)   . Dysphagia, pharyngoesophageal phase   . Esophageal reflux   . Essential hypertension   . Heart murmur   . Hemorrhoids   . Hyperlipidemia   . Internal hemorrhoids without mention of complication   . PONV (postoperative nausea and vomiting)   . Stricture and stenosis of esophagus   . Type 2 diabetes mellitus ([place])   . Unspecified gastritis and gastroduodenitis without mention of hemorrhage     Patient Active Problem List   Diagnosis Date Noted  . Bloating 08/30/2015  . Exertional chest pain 08/16/2015  . Chest pain 08/16/2015  . Mucosal abnormality of stomach   . Hemorrhoids 04/18/2015  . IBS (irritable bowel syndrome) 01/30/2015  . Type 2 diabetes mellitus ([place]) 12/22/2014  . Hyperlipidemia  12/22/2014  . Prolapse of female pelvic organs 11/28/2013  . Precordial pain 11/25/2013  . Essential hypertension 11/25/2013  . Heart murmur 11/25/2013  . Osteopenia 10/23/2012  . Chronic pancreatitis ([place]) 02/26/2011  . GERD (gastroesophageal reflux disease) 01/18/2011  . Constipation 11/29/2010  . Esophageal spasm 11/29/2010  . DJD (degenerative joint disease) 11/29/2010  . Hx of adenomatous colonic polyps 06/29/2009  . ESOPHAGEAL MOTILITY DISORDER 04/01/2008  . DM 03/01/2008  . HEMORRHOIDS, INTERNAL 07/02/2007  . Diverticulosis of large intestine 07/02/2007    Past Surgical History:  Procedure Laterality Date  . ABDOMINAL HYSTERECTOMY    . BACTERIAL OVERGROWTH TEST N/A 09/06/2015   Procedure: BACTERIAL OVERGROWTH TEST;  Surgeon: Daneil Dolin, MD;  Location: [facility];  Service: Endoscopy;  Laterality: N/A;  800  . BREAST LUMPECTOMY Right    benign  . CATARACT EXTRACTION Bilateral 2006   Implants in both, surgeries done 6 weeks apart  . CHOLECYSTECTOMY    . COLONOSCOPY  03/08/02   [name]: diverticulosis, internal hemorrhoids, adenomatous colon polyp  . COLONOSCOPY  01/23/04   [name]: diverticulosis, internal hemorrhoids  . COLONOSCOPY  09/25/05   [name]: diverticulosis  . COLONOSCOPY  07/05/09   [name]: severe diverticulosis  . COLONOSCOPY  01/21/11   [name]: severe diverticulosis in sigmoid to desc colon, int hemorrhoids, follow up TCS in 5 years  . COLONOSCOPY N/A 04/10/2016   Procedure: COLONOSCOPY;  Surgeon: Daneil Dolin, MD;  Location: [facility];  Service: Endoscopy;  Laterality: N/A;  12:30 PM  . DILATION AND CURETTAGE OF UTERUS    . ESOPHAGEAL MANOMETRY  03/21/08   Patterson: findings c/w [place] Esophagus  . ESOPHAGOGASTRODUODENOSCOPY  03/08/02   [name]: esophageal stricture, chronic gerd, s/p dilation.   . ESOPHAGOGASTRODUODENOSCOPY  01/23/04   [name]: esophageal stricture, gastritis, hiatal hernia  . ESOPHAGOGASTRODUODENOSCOPY  09/25/05    Patterson: gastritis, benign bx, no H.pylori  . ESOPHAGOGASTRODUODENOSCOPY  07/05/09   Patterson: gastropathy, benign small bowel bx and gastric bx  . ESOPHAGOGASTRODUODENOSCOPY N/A 05/15/2015   Dr. Gala Romney- diffuse moderate inflammation characterized by congestion (edema), erythema, and linear erosions was found in the entire examined stomach. bx= reactive gastropathy  . FOOT SURGERY Left   . HEMORRHOID BANDING  2017   Dr.[name]  . LAPAROSCOPIC VAGINAL HYSTERECTOMY    . RECTOCELE REPAIR    . TOTAL KNEE ARTHROPLASTY Right     OB History    Gravida Para Term Preterm AB Living   4 3 3   1 3    SAB TAB Ectopic Multiple Live Births   1               Home Medications    Prior to Admission medications   Medication Sig Start Date End Date Taking? Authorizing Provider  acetaminophen (TYLENOL) 500 MG tablet Take 500 mg by mouth at bedtime as needed for mild pain or moderate pain.    Yes [provider]  aspirin EC 81 MG tablet Take 81 mg by mouth at bedtime.   Yes [provider]  azelastine (ASTELIN) 0.1 % nasal spray Place 1 spray into both nostrils 2 (two) times daily as needed for rhinitis.   Yes [provider]  ezetimibe (ZETIA) 10 MG tablet TAKE 1 TABLET (10 MG TOTAL) BY MOUTH DAILY. Patient taking differently: Take 1 tablet by mouth at bedtime 04/22/16  Yes [name], MD  guaiFENesin (MUCINEX) 600 MG 12 hr tablet Take 600 mg by mouth 2 (two) times daily as needed for cough or to loosen phlegm.    Yes [provider]  imipramine (TOFRANIL) 10 MG tablet Take 3 tablets (30 mg total) by mouth at bedtime. Patient taking differently: Take 10 mg by mouth at bedtime.  06/27/15  Yes Mahala Menghini, PA-C  isometheptene-acetaminophen-dichloralphenazone (MIDRIN) [phone] MG capsule TAKE ONE CAPSULE BY MOUTH 4 TIMES A DAY AS NEEDED FOR HEADACHE 05/20/16  Yes [name], MD  ketotifen (ZADITOR) 0.025 % ophthalmic solution Place 1 drop into both eyes 2  (two) times daily as needed (for dry eyes).   Yes [provider]  meclizine (ANTIVERT) 25 MG tablet TAKE 1 TABLET (25 MG TOTAL) BY MOUTH 3 (THREE) TIMES DAILY AS NEEDED FOR DIZZINESS OR NAUSEA. 05/16/15  Yes [name], MD  metFORMIN (GLUCOPHAGE) 500 MG tablet TAKE 1 TABLET (500 MG TOTAL) BY MOUTH 2 (TWO) TIMES DAILY. 02/14/16  Yes Nilda Simmer, NP  Meth-Hyo-M Barnett Hatter Phos-Ph Sal (URIBEL) 118 MG CAPS Take 118 mg by mouth 3 (three) times daily with meals.    Yes [provider]  mirabegron ER (MYRBETRIQ) 25 MG TB24 tablet Take 25 mg by mouth daily.   Yes [provider]  Multiple Vitamins-Minerals (PRESERVISION AREDS 2) CAPS Take 1 capsule by mouth 2 (two) times daily.    Yes [provider]  nateglinide (STARLIX) 120 MG tablet Take 120 mg by mouth 3 (three) times daily before meals.   Yes [provider]  omega-3 acid  ethyl esters (LOVAZA) 1 g capsule Take 4 g by mouth daily.   Yes [provider]  omeprazole (PRILOSEC) 20 MG capsule Take 1 capsule (20 mg total) by mouth daily before breakfast. 04/27/15  Yes Mahala Menghini, PA-C  Pancrelipase, Lip-Prot-Amyl, 24000-76000 units CPEP Take 1-2 capsules by mouth 5 (five) times daily. Pt takes two capsules three times daily with meals and one capsule two times daily with snacks.   Yes [provider]  phenazopyridine (PYRIDIUM) 100 MG tablet Take 200 mg by mouth 3 (three) times daily as needed for pain.   Yes [provider]  valsartan (DIOVAN) 80 MG tablet TAKE 1 TABLET (80 MG TOTAL) BY MOUTH DAILY. Patient taking differently: Take 1 tablet by mouth at bedtime 02/14/16  Yes Nilda Simmer, NP  vitamin E 400 UNIT capsule Take 400 Units by mouth 4 (four) times a week. Pt takes on Tuesday, Thursday, Saturday, and Sunday.   Yes [provider]  Vitamin Mixture (ESTER-C) 500-60 MG TABS Take 1 tablet by mouth 4 (four) times a week. Pt takes on Sunday, Monday, Wednesday,  and Friday.   Yes [provider]  Wheat Dextrin (BENEFIBER PO) Take 1 packet by mouth daily.    Yes [provider]    Family History Family History  Problem Relation Age of Onset  . Ovarian cancer Mother   . Diabetes Father   . Heart disease Father   . Breast cancer Sister   . Liver cancer Sister   . Colon cancer Cousin        Paternal side  . Diabetes Maternal Aunt   . Liver disease Cousin        Maternal side, never drank    Social History Social History  Substance Use Topics  . Smoking status: Never Smoker  . Smokeless tobacco: Never Used     Comment: Never smoked  . Alcohol use No     Allergies   Adhesive [tape]; Estrogens; and Sulfonamide derivatives   Review of Systems Review of Systems  Constitutional: Negative for chills.  Gastrointestinal: Negative for vomiting.  Genitourinary: Positive for dysuria.  All other systems reviewed and are negative.    Physical Exam Updated Vital Signs BP (!) 141/65 (BP Location: Left Arm)   Pulse 92   Temp 98 F (36.7 C) (Oral)   Resp 18   Ht 5\' 4"  (1.626 m)   Wt 73.9 kg (163 lb)   SpO2 97%   BMI 27.98 kg/m   Physical Exam  Constitutional: She appears well-developed and well-nourished. No distress.  HENT:  Head: Normocephalic and atraumatic.  Eyes: Conjunctivae are normal.  Neck: Neck supple.  Cardiovascular: Normal rate and regular rhythm.   No murmur heard. Pulmonary/Chest: Effort normal and breath sounds normal. No respiratory distress.  Abdominal: Soft. There is no tenderness.  Musculoskeletal: She exhibits no edema.  Neurological: She is alert.  Skin: Skin is warm and dry.  Psychiatric: She has a normal mood and affect.  Nursing note and vitals reviewed.    ED Treatments / Results  Labs (all labs ordered are listed, but only abnormal results are displayed) Labs Reviewed  URINALYSIS, ROUTINE W REFLEX MICROSCOPIC - Abnormal; Notable for the following:       Result Value    Color, Urine AMBER (*)    APPearance CLOUDY (*)    Hgb urine dipstick SMALL (*)    Nitrite POSITIVE (*)    Leukocytes, UA MODERATE (*)    Bacteria, UA  FEW (*)    Squamous Epithelial / LPF 0-5 (*)    Non Squamous Epithelial 0-5 (*)    All other components within normal limits  URINE CULTURE    EKG  EKG Interpretation None       Radiology No results found.  Procedures Procedures (including critical care time)  Medications Ordered in ED Medications - No data to display   Initial Impression / Assessment and Plan / ED Course  I have reviewed the triage vital signs and the nursing notes.  Pertinent labs & imaging results that were available during my care of the patient were reviewed by me and considered in my medical decision making (see chart for details).       Final Clinical Impressions(s) / ED Diagnoses   Final diagnoses:  Acute cystitis without hematuria    New Prescriptions New Prescriptions   CIPROFLOXACIN (CIPRO) 500 MG TABLET    Take 1 tablet (500 mg total) by mouth 2 (two) times daily.  An After Visit Summary was printed and given to the patient.    Sidney Ace 07/21/16 2227    Noemi Chapel, MD 07/23/16 [phone]

## 2016-07-22 DIAGNOSIS — E119 Type 2 diabetes mellitus without complications: Secondary | ICD-10-CM | POA: Diagnosis not present

## 2016-07-22 NOTE — Telephone Encounter (Signed)
What dosage is she taking exactly?

## 2016-07-24 ENCOUNTER — Other Ambulatory Visit: Payer: Self-pay | Admitting: *Deleted

## 2016-07-24 LAB — URINE CULTURE: Culture: >=100000 — AB

## 2016-07-24 MED ORDER — MECLIZINE HCL 25 MG PO TABS: ORAL_TABLET | ORAL | 6 refills | Status: DC

## 2016-07-25 ENCOUNTER — Telehealth: Payer: Self-pay | Admitting: *Deleted

## 2016-07-25 NOTE — Telephone Encounter (Signed)
Post ED Visit - Positive Culture Follow-up  Culture report reviewed by antimicrobial stewardship pharmacist:  []  Elenor Quinones, Pharm.D. []  Heide Guile, Pharm.D., BCPS AQ-ID [x]  Parks Neptune, Pharm.D., BCPS []  Alycia Rossetti, Pharm.D., BCPS []  [place], Pharm.D., BCPS, AAHIVP []  Legrand Como, Pharm.D., BCPS, AAHIVP []  Salome Arnt, PharmD, BCPS []  Dimitri Ped, PharmD, BCPS []  Vincenza Hews, PharmD, BCPS  Positive urine culture Treated with Ciprofloxacin HCL, organism sensitive to the same and no further patient follow-up is required at this time.  Harlon Flor [facility] 07/25/2016, 10:56 AM

## 2016-07-29 ENCOUNTER — Other Ambulatory Visit: Payer: Self-pay | Admitting: Family Medicine

## 2016-07-31 ENCOUNTER — Other Ambulatory Visit: Payer: Self-pay

## 2016-07-31 NOTE — Telephone Encounter (Signed)
Please contact patient and verify imipramine dose, ie 10mg  and how many at bedtime?  This was asked by AB 9 days ago and no response yet.

## 2016-08-01 NOTE — Telephone Encounter (Signed)
Pt is only taking one at bedtime because taking 2 or 3 keeps her up all night long. She is currently not having any problems and said it would be ok to send in refills for one at bedtime.

## 2016-08-06 DIAGNOSIS — H34832 Tributary (branch) retinal vein occlusion, left eye, with macular edema: Secondary | ICD-10-CM | POA: Diagnosis not present

## 2016-08-12 ENCOUNTER — Other Ambulatory Visit: Payer: Self-pay | Admitting: Family Medicine

## 2016-08-12 NOTE — Telephone Encounter (Signed)
Last seen 06/26/2016

## 2016-08-13 ENCOUNTER — Other Ambulatory Visit: Payer: Self-pay | Admitting: Gastroenterology

## 2016-08-13 ENCOUNTER — Ambulatory Visit (INDEPENDENT_AMBULATORY_CARE_PROVIDER_SITE_OTHER): Payer: Medicare Other | Admitting: Urology

## 2016-08-13 DIAGNOSIS — N3000 Acute cystitis without hematuria: Secondary | ICD-10-CM

## 2016-08-18 ENCOUNTER — Other Ambulatory Visit: Payer: Self-pay | Admitting: Nurse Practitioner

## 2016-09-02 ENCOUNTER — Other Ambulatory Visit: Payer: Self-pay

## 2016-09-03 MED ORDER — IMIPRAMINE HCL 10 MG PO TABS: 10.0000 mg | ORAL_TABLET | Freq: Every day | ORAL | 3 refills | Status: DC

## 2016-09-11 ENCOUNTER — Ambulatory Visit (INDEPENDENT_AMBULATORY_CARE_PROVIDER_SITE_OTHER): Payer: Medicare Other | Admitting: Urology

## 2016-09-11 DIAGNOSIS — N3020 Other chronic cystitis without hematuria: Secondary | ICD-10-CM | POA: Diagnosis not present

## 2016-09-11 DIAGNOSIS — N3010 Interstitial cystitis (chronic) without hematuria: Secondary | ICD-10-CM

## 2016-09-14 DIAGNOSIS — E119 Type 2 diabetes mellitus without complications: Secondary | ICD-10-CM | POA: Diagnosis not present

## 2016-09-17 DIAGNOSIS — H34832 Tributary (branch) retinal vein occlusion, left eye, with macular edema: Secondary | ICD-10-CM | POA: Diagnosis not present

## 2016-09-18 DIAGNOSIS — L304 Erythema intertrigo: Secondary | ICD-10-CM | POA: Diagnosis not present

## 2016-09-24 DIAGNOSIS — N3010 Interstitial cystitis (chronic) without hematuria: Secondary | ICD-10-CM | POA: Diagnosis not present

## 2016-09-24 DIAGNOSIS — M62838 Other muscle spasm: Secondary | ICD-10-CM | POA: Diagnosis not present

## 2016-09-24 DIAGNOSIS — M6281 Muscle weakness (generalized): Secondary | ICD-10-CM | POA: Diagnosis not present

## 2016-09-24 DIAGNOSIS — R102 Pelvic and perineal pain: Secondary | ICD-10-CM | POA: Diagnosis not present

## 2016-09-30 ENCOUNTER — Telehealth: Payer: Self-pay | Admitting: Internal Medicine

## 2016-09-30 MED ORDER — IMIPRAMINE HCL 10 MG PO TABS: 10.0000 mg | ORAL_TABLET | Freq: Every day | ORAL | 3 refills | Status: DC

## 2016-09-30 NOTE — Telephone Encounter (Signed)
Pt is aware. She is doing well at this time and will call if she needs anything.

## 2016-09-30 NOTE — Telephone Encounter (Signed)
[phone] patient picked up her prescription of Imipramine and it was 30 tablets, wanted the prescription sent in for [facility] to [facility] [address].  It is cheaper for her that way and she is almost due for a refill

## 2016-09-30 NOTE — Telephone Encounter (Signed)
done

## 2016-09-30 NOTE — Telephone Encounter (Signed)
Routing to the refill box. Please send in 90 day supply.

## 2016-09-30 NOTE — Addendum Note (Signed)
Addended by: Mahala Menghini on: 09/30/2016 04:19 PM   Modules accepted: Orders

## 2016-10-01 DIAGNOSIS — M62838 Other muscle spasm: Secondary | ICD-10-CM | POA: Diagnosis not present

## 2016-10-01 DIAGNOSIS — M6281 Muscle weakness (generalized): Secondary | ICD-10-CM | POA: Diagnosis not present

## 2016-10-01 DIAGNOSIS — K5900 Constipation, unspecified: Secondary | ICD-10-CM | POA: Diagnosis not present

## 2016-10-01 DIAGNOSIS — N3010 Interstitial cystitis (chronic) without hematuria: Secondary | ICD-10-CM | POA: Diagnosis not present

## 2016-10-10 DIAGNOSIS — Z961 Presence of intraocular lens: Secondary | ICD-10-CM | POA: Diagnosis not present

## 2016-10-15 DIAGNOSIS — M62838 Other muscle spasm: Secondary | ICD-10-CM | POA: Diagnosis not present

## 2016-10-15 DIAGNOSIS — K5900 Constipation, unspecified: Secondary | ICD-10-CM | POA: Diagnosis not present

## 2016-10-15 DIAGNOSIS — M6281 Muscle weakness (generalized): Secondary | ICD-10-CM | POA: Diagnosis not present

## 2016-10-15 DIAGNOSIS — N3010 Interstitial cystitis (chronic) without hematuria: Secondary | ICD-10-CM | POA: Diagnosis not present

## 2016-10-16 ENCOUNTER — Ambulatory Visit (INDEPENDENT_AMBULATORY_CARE_PROVIDER_SITE_OTHER): Payer: Medicare Other | Admitting: Urology

## 2016-10-16 DIAGNOSIS — N3020 Other chronic cystitis without hematuria: Secondary | ICD-10-CM | POA: Diagnosis not present

## 2016-10-16 DIAGNOSIS — R3915 Urgency of urination: Secondary | ICD-10-CM | POA: Diagnosis not present

## 2016-10-28 ENCOUNTER — Encounter: Payer: Self-pay | Admitting: Gastroenterology

## 2016-10-28 ENCOUNTER — Ambulatory Visit (INDEPENDENT_AMBULATORY_CARE_PROVIDER_SITE_OTHER): Payer: Medicare Other | Admitting: Gastroenterology

## 2016-10-28 ENCOUNTER — Other Ambulatory Visit: Payer: Self-pay | Admitting: Family Medicine

## 2016-10-28 VITALS — BP 131/62 | HR 88 | Temp 97.1°F | Ht 64.0 in | Wt 165.2 lb

## 2016-10-28 DIAGNOSIS — K224 Dyskinesia of esophagus: Secondary | ICD-10-CM | POA: Diagnosis not present

## 2016-10-28 DIAGNOSIS — R197 Diarrhea, unspecified: Secondary | ICD-10-CM

## 2016-10-28 DIAGNOSIS — R140 Abdominal distension (gaseous): Secondary | ICD-10-CM

## 2016-10-28 DIAGNOSIS — K861 Other chronic pancreatitis: Secondary | ICD-10-CM

## 2016-10-28 NOTE — Patient Instructions (Signed)
1. If you are having worsening bloating or abdominal pain, please call and we can consider CT scan of your abdomen.  2. Call with your two medications we need to add to your medication list.  3. If you have persistent diarrhea for 2-3 days, collect stool for testing and take to [place]. 4. Return to the office in 3 months.

## 2016-10-28 NOTE — Assessment & Plan Note (Addendum)
Since for follow-up of chronic symptoms. Intermittent epigastric discomfort associated with bloating, some vague early satiety. Weight has been stable. Esophageal spasms controlled, remains on imipramine. Clinically seems to be doing fairly well at this time, stable with baseline chronic GI issues. I did offer patient CT abdomen and pelvis for bloating and abdominal pain as she has not had recent imaging but she declines at this time would like to continue to monitor symptoms which I feel is reasonable. She will let me know if progressive symptoms or if she decides to pursue CT. Otherwise we will have her come back and see Dr. Gala Romney in 3 months.  Recent diarrhea appears to be related to something she ate. If she has persistent diarrhea, would consider C. difficile PCR given repeated use of antibiotics. Patient voiced understanding. Sample container and orders provided to patient today.

## 2016-10-28 NOTE — Progress Notes (Signed)
cc'ed to pcp °

## 2016-10-28 NOTE — Assessment & Plan Note (Signed)
Continue imipramine. Continues to do well.

## 2016-10-28 NOTE — Progress Notes (Signed)
Primary Care Physician: [name], MD  Primary Gastroenterologist:  Garfield Cornea, MD   Chief Complaint  Patient presents with  . Bloated  . Abdominal Pain    "soreness"    HPI: Deborah Gray is a 83 y.o. female here for further evaluation of abdominal bloating/soreness. Last seen in 02/2016. Colonoscopy 03/2016 with diverticulosis. She has a history of adenomatous colon polyps. Also with nutcracker esophagus, remote esophageal strictures, esophageal spasms improved on imipramine, idiopathic chronic pancreatitis, IBS. Recurrent UTIs, interstitial cystitis. EGD with reactive gastropathy earlier last year. Hydrogen breath test July 2017 negative.   Continues to complain of upper abdominal discomfort and bloating. Always worse with meals. Remotely she had a normal gastric emptying study about 5 years ago. Feels full at times. Has not swallowed too much air when she is eating. Uses straws only at restaurants.  Trying to get by with taking just two Creon with meals and 1 with snacks. Few weeks ago was having more difficulty but has seemed to settle down again. She's been having a lot of problems with interstitial cystitis of the last 6 months. Had been on cephalexin somewhat chronically and did receive Cipro along the way. Currently on a new medication but she cannot recall the name.  Recently both her husband and her developed diarrhea after eating broccoli/carrot raw mixture from the grocery store. Stomach "upset" after eating out at a reunion this weekend. Esophageal spasms seem to be controlled right now. Denies heartburn. No melena rectal bleeding. Bowel movements somewhat better today.    Current Outpatient Prescriptions  Medication Sig Dispense Refill  . acetaminophen (TYLENOL) 500 MG tablet Take 500 mg by mouth at bedtime as needed for mild pain or moderate pain.     Marland Kitchen aspirin EC 81 MG tablet Take 81 mg by mouth at bedtime.    Marland Kitchen azelastine (ASTELIN) 0.1 % nasal spray  Place 1 spray into both nostrils 2 (two) times daily as needed for rhinitis.    Marland Kitchen ezetimibe (ZETIA) 10 MG tablet TAKE 1 TABLET (10 MG TOTAL) BY MOUTH DAILY. 90 tablet 1  . guaiFENesin (MUCINEX) 600 MG 12 hr tablet Take 600 mg by mouth 2 (two) times daily as needed for cough or to loosen phlegm.     Marland Kitchen imipramine (TOFRANIL) 10 MG tablet Take 1 tablet (10 mg total) by mouth at bedtime. 90 tablet 3  . isometheptene-acetaminophen-dichloralphenazone (MIDRIN) 65-100-325 MG capsule TAKE ONE CAPSULE BY MOUTH 4 TIMES A DAY AS NEEDED FOR HEADACHE 30 capsule 2  . ketotifen (ZADITOR) 0.025 % ophthalmic solution Place 1 drop into both eyes 2 (two) times daily as needed (for dry eyes).    . meclizine (ANTIVERT) 25 MG tablet TAKE 1 TABLET (25 MG TOTAL) BY MOUTH 3 (THREE) TIMES DAILY AS NEEDED FOR DIZZINESS OR NAUSEA. 30 tablet 6  . metFORMIN (GLUCOPHAGE) 500 MG tablet TAKE 1 TABLET (500 MG TOTAL) BY MOUTH 2 (TWO) TIMES DAILY. 180 tablet 1  . Meth-Hyo-M Bl-Na Phos-Ph Sal (URIBEL) 118 MG CAPS Take 118 mg by mouth 3 (three) times daily with meals.     . mirabegron ER (MYRBETRIQ) 25 MG TB24 tablet Take 25 mg by mouth daily.    . Multiple Vitamins-Minerals (PRESERVISION AREDS 2) CAPS Take 1 capsule by mouth 2 (two) times daily.     . nateglinide (STARLIX) 120 MG tablet Take 120 mg by mouth 3 (three) times daily before meals.    . nateglinide (STARLIX) 120 MG tablet TAKE 1  TABLET BY MOUTH 3 TIMES A DAY BEFORE MEALS DUE 4-3 270 tablet 1  . omega-3 acid ethyl esters (LOVAZA) 1 g capsule Take 4 g by mouth daily.    Marland Kitchen omeprazole (PRILOSEC) 20 MG capsule TAKE ONE CAPSULE BY MOUTH EVERY MORNING BEFORE BREAKFAST 90 capsule 3  . Pancrelipase, Lip-Prot-Amyl, 24000-76000 units CPEP Take 1-2 capsules by mouth 5 (five) times daily. Pt takes two capsules three times daily with meals and one capsule at bedtime    . valsartan (DIOVAN) 80 MG tablet TAKE 1 TABLET (80 MG TOTAL) BY MOUTH DAILY. 90 tablet 1  . vitamin E 400 UNIT capsule  Take 400 Units by mouth 4 (four) times a week. Pt takes on Tuesday, Thursday, Saturday, and Sunday.    . Vitamin Mixture (ESTER-C) 500-60 MG TABS Take 1 tablet by mouth 4 (four) times a week. Pt takes on Sunday, Monday, Wednesday, and Friday.    . Wheat Dextrin (BENEFIBER PO) Take 1 packet by mouth daily.      No current facility-administered medications for this visit.     Allergies as of 10/28/2016 - Review Complete 10/28/2016  Allergen Reaction Noted  . Adhesive [tape] Other (See Comments) 07/21/2016  . Estrogens Other (See Comments) 10/15/2012  . Sulfonamide derivatives Rash 03/01/2008   Past Medical History:  Diagnosis Date  . Arthritis   . Benign neoplasm of colon   . Constipation   . Diaphragmatic hernia without mention of obstruction or gangrene   . Diverticulosis of colon (without mention of hemorrhage)   . Dysphagia, pharyngoesophageal phase   . Esophageal reflux   . Essential hypertension   . Heart murmur   . Hemorrhoids   . Hyperlipidemia   . Internal hemorrhoids without mention of complication   . PONV (postoperative nausea and vomiting)   . Stricture and stenosis of esophagus   . Type 2 diabetes mellitus ([place])   . Unspecified gastritis and gastroduodenitis without mention of hemorrhage    Past Surgical History:  Procedure Laterality Date  . ABDOMINAL HYSTERECTOMY    . BACTERIAL OVERGROWTH TEST N/A 09/06/2015   Procedure: BACTERIAL OVERGROWTH TEST;  Surgeon: Daneil Dolin, MD;  Location: [facility];  Service: Endoscopy;  Laterality: N/A;  800  . BREAST LUMPECTOMY Right    benign  . CATARACT EXTRACTION Bilateral 2006   Implants in both, surgeries done 6 weeks apart  . CHOLECYSTECTOMY    . COLONOSCOPY  03/08/02   [name]: diverticulosis, internal hemorrhoids, adenomatous colon polyp  . COLONOSCOPY  01/23/04   [name]: diverticulosis, internal hemorrhoids  . COLONOSCOPY  09/25/05   [name]: diverticulosis  . COLONOSCOPY  07/05/09   [name]: severe  diverticulosis  . COLONOSCOPY  01/21/11   [name]: severe diverticulosis in sigmoid to desc colon, int hemorrhoids, follow up TCS in 5 years  . COLONOSCOPY N/A 04/10/2016   Procedure: COLONOSCOPY;  Surgeon: Daneil Dolin, MD;  Location: [facility];  Service: Endoscopy;  Laterality: N/A;  12:30 PM  . DILATION AND CURETTAGE OF UTERUS    . ESOPHAGEAL MANOMETRY  03/21/08   Patterson: findings c/w Nutcracker Esophagus  . ESOPHAGOGASTRODUODENOSCOPY  03/08/02   [name]: esophageal stricture, chronic gerd, s/p dilation.   . ESOPHAGOGASTRODUODENOSCOPY  01/23/04   [name]: esophageal stricture, gastritis, hiatal hernia  . ESOPHAGOGASTRODUODENOSCOPY  09/25/05   [facility]terson: gastritis, benign bx, no H.pylori  . ESOPHAGOGASTRODUODENOSCOPY  07/05/09   Patterson: gastropathy, benign small bowel bx and gastric bx  . ESOPHAGOGASTRODUODENOSCOPY N/A 05/15/2015   Dr. Gala Romney- diffuse moderate  inflammation characterized by congestion (edema), erythema, and linear erosions was found in the entire examined stomach. bx= reactive gastropathy  . FOOT SURGERY Left   . HEMORRHOID BANDING  2017   Dr.[name]  . LAPAROSCOPIC VAGINAL HYSTERECTOMY    . RECTOCELE REPAIR    . TOTAL KNEE ARTHROPLASTY Right     ROS:  General: Negative for anorexia, weight loss, fever, chills, fatigue, weakness. ENT: Negative for hoarseness, difficulty swallowing , nasal congestion. CV: Negative for chest pain, angina, palpitations, dyspnea on exertion, peripheral edema.  Respiratory: Negative for dyspnea at rest, dyspnea on exertion, cough, sputum, wheezing.  GI: See history of present illness. GU:  Negative for dysuria, hematuria, urinary incontinence, urinary frequency, nocturnal urination.  Endo: Negative for unusual weight change.    Physical Examination:   BP 131/62   Pulse 88   Temp (!) 97.1 F (36.2 C) (Oral)   Ht 5\' 4"  (1.626 m)   Wt 165 lb 3.2 oz (74.9 kg)   BMI 28.36 kg/m   General: Well-nourished, well-developed in no  acute distress.  Eyes: No icterus. Mouth: Oropharyngeal mucosa moist and pink , no lesions erythema or exudate. Lungs: Clear to auscultation bilaterally.  Heart: Regular rate and rhythm, no murmurs rubs or gallops.  Abdomen: Bowel sounds are normal, nontender, nondistended, no hepatosplenomegaly or masses, no abdominal bruits or hernia , no rebound or guarding.   Extremities: No lower extremity edema. No clubbing or deformities. Neuro: Alert and oriented x 4   Skin: Warm and dry, no jaundice.   Psych: Alert and cooperative, normal mood and affect.  Labs:  Lab Results  Component Value Date   CREATININE 0.73 06/19/2016   BUN 20 06/19/2016   NA 143 06/19/2016   K 5.0 06/19/2016   CL 103 06/19/2016   CO2 25 06/19/2016   Lab Results  Component Value Date   WBC 7.7 03/31/2016   HGB 14.8 03/31/2016   HCT 44.6 03/31/2016   MCV 89.2 03/31/2016   PLT 176 03/31/2016   Lab Results  Component Value Date   ALT 14 06/19/2016   AST 13 06/19/2016   ALKPHOS 70 06/19/2016   BILITOT 0.4 06/19/2016   Lab Results  Component Value Date   LIPASE <10 (L) 03/31/2016    Lab Results  Component Value Date   HGBA1C 6.5 (H) 06/19/2016    Imaging Studies: No results found.

## 2016-10-28 NOTE — Assessment & Plan Note (Signed)
Weight is stable. Intermittent fatty stools but overall controlled with Creon.

## 2016-10-29 DIAGNOSIS — H353221 Exudative age-related macular degeneration, left eye, with active choroidal neovascularization: Secondary | ICD-10-CM | POA: Diagnosis not present

## 2016-10-30 DIAGNOSIS — M6289 Other specified disorders of muscle: Secondary | ICD-10-CM | POA: Diagnosis not present

## 2016-10-30 DIAGNOSIS — R102 Pelvic and perineal pain: Secondary | ICD-10-CM | POA: Diagnosis not present

## 2016-10-30 DIAGNOSIS — M6281 Muscle weakness (generalized): Secondary | ICD-10-CM | POA: Diagnosis not present

## 2016-10-30 DIAGNOSIS — M62838 Other muscle spasm: Secondary | ICD-10-CM | POA: Diagnosis not present

## 2016-10-31 DIAGNOSIS — E119 Type 2 diabetes mellitus without complications: Secondary | ICD-10-CM | POA: Diagnosis not present

## 2016-11-04 ENCOUNTER — Other Ambulatory Visit: Payer: Self-pay | Admitting: Family Medicine

## 2016-11-04 DIAGNOSIS — Z1231 Encounter for screening mammogram for malignant neoplasm of breast: Secondary | ICD-10-CM

## 2016-11-11 ENCOUNTER — Ambulatory Visit (INDEPENDENT_AMBULATORY_CARE_PROVIDER_SITE_OTHER): Payer: Medicare Other | Admitting: Family Medicine

## 2016-11-11 VITALS — BP 118/74 | Ht 64.0 in | Wt 165.0 lb

## 2016-11-11 DIAGNOSIS — I10 Essential (primary) hypertension: Secondary | ICD-10-CM

## 2016-11-11 DIAGNOSIS — I83893 Varicose veins of bilateral lower extremities with other complications: Secondary | ICD-10-CM | POA: Diagnosis not present

## 2016-11-11 DIAGNOSIS — Z23 Encounter for immunization: Secondary | ICD-10-CM

## 2016-11-11 MED ORDER — LOSARTAN POTASSIUM 50 MG PO TABS: 50.0000 mg | ORAL_TABLET | Freq: Every day | ORAL | 1 refills | Status: DC

## 2016-11-11 NOTE — Progress Notes (Signed)
   Subjective:    Patient ID: Deborah Gray, female    DOB: 1933-06-23, 83 y.o.   MRN: [id number]  Leg Pain   Incident onset: years. Pain location: both legs.   PT HAS SENSITIVE DISCOMFORT AT THE BACK OF THE KNEE, WITH IRRITATION AND  Notes more swelling in her legs after long day on her feet. Also legs more achy. Notes varicose veins are more pronounced. Had remote work on her varicose veins with some success it lasted for a while.  Reports a lot of fatigue and tiredness since she got the flu back in January that has persisted.  Patient does not really exercise per se   Notes weakness in legs at times, almost as if the sugar is droping  Pt got a letter stating she needs to change diovan. Has plan on it for quite some time now there is national recall.     Review of Systems No headache, no major weight loss or weight gain, no chest pain no back pain abdominal pain no change in bowel habits complete ROS otherwise negative     Objective:   Physical Exam Alert and oriented, vitals reviewed and stable, NAD ENT-TM's and ext canals WNL bilat via otoscopic exam Soft palate, tonsils and post pharynx WNL via oropharyngeal exam Neck-symmetric, no masses; thyroid nonpalpable and nontender Pulmonary-no tachypnea or accessory muscle use; Clear without wheezes via auscultation Card--no abnrml murmurs, rhythm reg and rate WNL Carotid pulses symmetric, without bruits Legs reveal bilateral venous stasis changes. Multiple diffuse prominent venules and varicosities. None tender to palpation.       Assessment & Plan:  Impression 1 chronic venous stasis discussed. Would encourage regular use of compression stockings. Patient states she uses off-and-on.  #2 blood pressure realities need to switch to losartan patient not thrilled about this. We'll proceed. Also flu shot.  Patient enormously descriptive of her concerns to great length.  Greater than 50% of this 25 minute face to face visit  was spent in counseling and discussion and coordination of care regarding the above diagnosis/diagnosies

## 2016-11-15 ENCOUNTER — Ambulatory Visit (INDEPENDENT_AMBULATORY_CARE_PROVIDER_SITE_OTHER): Payer: Medicare Other | Admitting: Urology

## 2016-11-15 DIAGNOSIS — N3010 Interstitial cystitis (chronic) without hematuria: Secondary | ICD-10-CM

## 2016-11-27 ENCOUNTER — Ambulatory Visit (HOSPITAL_COMMUNITY): Payer: Medicare Other

## 2016-11-27 DIAGNOSIS — N3010 Interstitial cystitis (chronic) without hematuria: Secondary | ICD-10-CM | POA: Diagnosis not present

## 2016-11-27 DIAGNOSIS — M6281 Muscle weakness (generalized): Secondary | ICD-10-CM | POA: Diagnosis not present

## 2016-11-27 DIAGNOSIS — M62838 Other muscle spasm: Secondary | ICD-10-CM | POA: Diagnosis not present

## 2016-11-27 DIAGNOSIS — R102 Pelvic and perineal pain: Secondary | ICD-10-CM | POA: Diagnosis not present

## 2016-11-28 ENCOUNTER — Ambulatory Visit (HOSPITAL_COMMUNITY)
Admission: RE | Admit: 2016-11-28 | Discharge: 2016-11-28 | Disposition: A | Discharge status: Home / Self Care | Payer: Medicare Other | Source: Ambulatory Visit | Attending: Family Medicine | Admitting: Family Medicine

## 2016-11-28 DIAGNOSIS — Z1231 Encounter for screening mammogram for malignant neoplasm of breast: Secondary | ICD-10-CM | POA: Insufficient documentation

## 2016-12-10 DIAGNOSIS — H34832 Tributary (branch) retinal vein occlusion, left eye, with macular edema: Secondary | ICD-10-CM | POA: Diagnosis not present

## 2016-12-18 DIAGNOSIS — E119 Type 2 diabetes mellitus without complications: Secondary | ICD-10-CM | POA: Diagnosis not present

## 2016-12-19 ENCOUNTER — Telehealth: Payer: Self-pay | Admitting: Family Medicine

## 2016-12-19 DIAGNOSIS — E785 Hyperlipidemia, unspecified: Secondary | ICD-10-CM

## 2016-12-19 DIAGNOSIS — Z79899 Other long term (current) drug therapy: Secondary | ICD-10-CM

## 2016-12-19 DIAGNOSIS — E119 Type 2 diabetes mellitus without complications: Secondary | ICD-10-CM

## 2016-12-19 NOTE — Telephone Encounter (Signed)
Patient has physical in 2 weeks with Hoyle Sauer and would like orders put in for lab work at [facility].

## 2016-12-20 NOTE — Telephone Encounter (Signed)
Patient notified

## 2016-12-20 NOTE — Telephone Encounter (Signed)
Left message to return call 

## 2016-12-20 NOTE — Telephone Encounter (Signed)
Lipid, liver, HgbA1c  Thanks.

## 2016-12-23 DIAGNOSIS — Z79899 Other long term (current) drug therapy: Secondary | ICD-10-CM | POA: Diagnosis not present

## 2016-12-23 DIAGNOSIS — E785 Hyperlipidemia, unspecified: Secondary | ICD-10-CM | POA: Diagnosis not present

## 2016-12-23 DIAGNOSIS — E119 Type 2 diabetes mellitus without complications: Secondary | ICD-10-CM | POA: Diagnosis not present

## 2016-12-24 LAB — LIPID PANEL
CHOL/HDL RATIO: 3.5 ratio (ref 0.0–4.4)
Cholesterol, Total: 189 mg/dL (ref 100–199)
HDL: 54 mg/dL (ref >39)
LDL Calculated: 112 mg/dL — ABNORMAL HIGH (ref 0–99)
TRIGLYCERIDES: 117 mg/dL (ref 0–149)
VLDL Cholesterol Cal: 23 mg/dL (ref 5–40)

## 2016-12-24 LAB — HEPATIC FUNCTION PANEL
ALT: 13 IU/L (ref 0–32)
AST: 11 IU/L (ref 0–40)
Albumin: 4.5 g/dL (ref 3.5–4.7)
Alkaline Phosphatase: 74 IU/L (ref 39–117)
BILIRUBIN TOTAL: 0.5 mg/dL (ref 0.0–1.2)
Bilirubin, Direct: 0.12 mg/dL (ref 0.00–0.40)
TOTAL PROTEIN: 6.8 g/dL (ref 6.0–8.5)

## 2016-12-24 LAB — HEMOGLOBIN A1C
Est. average glucose Bld gHb Est-mCnc: 146 mg/dL
HEMOGLOBIN A1C: 6.7 % — AB (ref 4.8–5.6)

## 2016-12-27 ENCOUNTER — Ambulatory Visit (INDEPENDENT_AMBULATORY_CARE_PROVIDER_SITE_OTHER): Payer: Medicare Other | Admitting: Urology

## 2016-12-27 DIAGNOSIS — N3010 Interstitial cystitis (chronic) without hematuria: Secondary | ICD-10-CM | POA: Diagnosis not present

## 2016-12-31 ENCOUNTER — Other Ambulatory Visit: Payer: Self-pay | Admitting: *Deleted

## 2016-12-31 ENCOUNTER — Ambulatory Visit (INDEPENDENT_AMBULATORY_CARE_PROVIDER_SITE_OTHER): Payer: Medicare Other | Admitting: Internal Medicine

## 2016-12-31 ENCOUNTER — Encounter: Payer: Self-pay | Admitting: Internal Medicine

## 2016-12-31 ENCOUNTER — Encounter: Payer: Self-pay | Admitting: *Deleted

## 2016-12-31 VITALS — BP 128/71 | HR 93 | Temp 97.4°F | Ht 64.0 in | Wt 167.4 lb

## 2016-12-31 DIAGNOSIS — R1310 Dysphagia, unspecified: Secondary | ICD-10-CM | POA: Diagnosis not present

## 2016-12-31 DIAGNOSIS — R1319 Other dysphagia: Secondary | ICD-10-CM

## 2016-12-31 DIAGNOSIS — K861 Other chronic pancreatitis: Secondary | ICD-10-CM

## 2016-12-31 NOTE — Progress Notes (Signed)
Primary Care Physician:  [name], MD Primary Gastroenterologist:  Dr. Gala Romney  Pre-Procedure History & Physical: HPI:  Deborah Gray is a 83 y.o. female here for follow-up of chronic pancreatitis with bloating and GERD/dysphagia. Patient has noticed some vague symptoms of esophageal dysphagia to solids recently. 2 episodes of diarrhea since her last office visit 2 months ago. Otherwise she's done well. She's gained 2 pounds.  Past Medical History:  Diagnosis Date  . Arthritis   . Benign neoplasm of colon   . Constipation   . Diaphragmatic hernia without mention of obstruction or gangrene   . Diverticulosis of colon (without mention of hemorrhage)   . Dysphagia, pharyngoesophageal phase   . Esophageal reflux   . Essential hypertension   . Heart murmur   . Hemorrhoids   . Hyperlipidemia   . Internal hemorrhoids without mention of complication   . PONV (postoperative nausea and vomiting)   . Stricture and stenosis of esophagus   . Type 2 diabetes mellitus ([place])   . Unspecified gastritis and gastroduodenitis without mention of hemorrhage     Past Surgical History:  Procedure Laterality Date  . ABDOMINAL HYSTERECTOMY    . BREAST LUMPECTOMY Right    benign  . CATARACT EXTRACTION Bilateral 2006   Implants in both, surgeries done 6 weeks apart  . CHOLECYSTECTOMY    . COLONOSCOPY  03/08/02   [name]: diverticulosis, internal hemorrhoids, adenomatous colon polyp  . COLONOSCOPY  01/23/04   [name]: diverticulosis, internal hemorrhoids  . COLONOSCOPY  09/25/05   [name]: diverticulosis  . COLONOSCOPY  07/05/09   [name]: severe diverticulosis  . COLONOSCOPY  01/21/11   [name]: severe diverticulosis in sigmoid to desc colon, int hemorrhoids, follow up TCS in 5 years  . DILATION AND CURETTAGE OF UTERUS    . ESOPHAGEAL MANOMETRY  03/21/08   [facility]: findings c/w Nutcracker Esophagus  . ESOPHAGOGASTRODUODENOSCOPY  03/08/02   [name]: esophageal stricture,  chronic gerd, s/p dilation.   . ESOPHAGOGASTRODUODENOSCOPY  01/23/04   [name]: esophageal stricture, gastritis, hiatal hernia  . ESOPHAGOGASTRODUODENOSCOPY  09/25/05   Patterson: gastritis, benign bx, no H.pylori  . ESOPHAGOGASTRODUODENOSCOPY  07/05/09   Patterson: gastropathy, benign small bowel bx and gastric bx  . FOOT SURGERY Left   . HEMORRHOID BANDING  2017   Dr.[name]  . LAPAROSCOPIC VAGINAL HYSTERECTOMY    . RECTOCELE REPAIR    . TOTAL KNEE ARTHROPLASTY Right     Prior to Admission medications   Medication Sig Start Date End Date Taking? Authorizing Provider  acetaminophen (TYLENOL) 500 MG tablet Take 500 mg by mouth at bedtime as needed for mild pain or moderate pain.    Yes [provider]  aspirin EC 81 MG tablet Take 81 mg by mouth at bedtime.   Yes [provider]  azelastine (ASTELIN) 0.1 % nasal spray Place 1 spray into both nostrils 2 (two) times daily as needed for rhinitis.   Yes [provider]  clidinium-chlordiazePOXIDE (LIBRAX) 5-2.5 MG capsule Take 1 capsule as needed by mouth.    Yes [provider]  ezetimibe (ZETIA) 10 MG tablet TAKE 1 TABLET (10 MG TOTAL) BY MOUTH DAILY. 10/29/16  Yes [name], MD  guaiFENesin (MUCINEX) 600 MG 12 hr tablet Take 600 mg by mouth 2 (two) times daily as needed for cough or to loosen phlegm.    Yes [provider]  imipramine (TOFRANIL) 10 MG tablet Take 1 tablet (10 mg total) by mouth at bedtime. 09/30/16  Yes Mahala Menghini, PA-C  isometheptene-acetaminophen-dichloralphenazone (MIDRIN) [phone] MG capsule TAKE ONE CAPSULE BY MOUTH 4 TIMES A DAY AS NEEDED FOR HEADACHE 05/20/16  Yes [name], MD  ketotifen (ZADITOR) 0.025 % ophthalmic solution 1 drop 2 (two) times daily.   Yes [provider]  losartan (COZAAR) 50 MG tablet Take 1 tablet (50 mg total) by mouth daily. 11/11/16  Yes [name], MD  meclizine (ANTIVERT) 25 MG tablet TAKE 1 TABLET (25 MG TOTAL) BY  MOUTH 3 (THREE) TIMES DAILY AS NEEDED FOR DIZZINESS OR NAUSEA. 07/24/16  Yes [name], MD  metFORMIN (GLUCOPHAGE) 500 MG tablet TAKE 1 TABLET (500 MG TOTAL) BY MOUTH 2 (TWO) TIMES DAILY. 10/29/16  Yes [name], MD  Meth-Hyo-M Barnett Hatter Phos-Ph Sal (URIBEL) 118 MG CAPS Take 118 mg 2 (two) times daily by mouth.    Yes [provider]  methenamine (HIPREX) 1 g tablet Take 1 g by mouth 2 (two) times daily with a meal.   Yes [provider]  mirabegron ER (MYRBETRIQ) 25 MG TB24 tablet Take 25 mg by mouth daily.   Yes [provider]  Multiple Vitamins-Minerals (PRESERVISION AREDS 2) CAPS Take 1 capsule by mouth 2 (two) times daily.    Yes [provider]  nateglinide (STARLIX) 120 MG tablet TAKE 1 TABLET BY MOUTH 3 TIMES A DAY BEFORE MEALS DUE 4-3 08/12/16  Yes [name], MD  omega-3 acid ethyl esters (LOVAZA) 1 g capsule Take 4 g by mouth daily.   Yes [provider]  omeprazole (PRILOSEC) 20 MG capsule TAKE ONE CAPSULE BY MOUTH EVERY MORNING BEFORE BREAKFAST 08/13/16  Yes Annitta Needs, NP  Pancrelipase, Lip-Prot-Amyl, 24000-76000 units CPEP Take 1-3 capsules 5 (five) times daily by mouth. Pt takes 3 capsules three times daily with meals and one capsule with snack   Yes [provider]  vitamin E 400 UNIT capsule Take 400 Units by mouth 4 (four) times a week. Pt takes on Tuesday, Thursday, Saturday, and Sunday.   Yes [provider]  Vitamin Mixture (ESTER-C) 500-60 MG TABS Take 1 tablet by mouth 4 (four) times a week. Pt takes on Sunday, Monday, Wednesday, and Friday.   Yes [provider]  Wheat Dextrin (BENEFIBER PO) Take 1 packet by mouth daily.    Yes [provider]    Allergies as of 12/31/2016 - Review Complete 12/31/2016  Allergen Reaction Noted  . Adhesive [tape] Other (See Comments) 07/21/2016  . Estrogens Other (See Comments) 10/15/2012  . Sulfonamide derivatives Rash 03/01/2008    Family  History  Problem Relation Age of Onset  . Ovarian cancer Mother   . Diabetes Father   . Heart disease Father   . Breast cancer Sister   . Liver cancer Sister   . Colon cancer Cousin        Paternal side  . Diabetes Maternal Aunt   . Liver disease Cousin        Maternal side, never drank    Social History   Socioeconomic History  . Marital status: Married    Spouse name: Not on file  . Number of children: 3  . Years of education: Not on file  . Highest education level: Not on file  Social Needs  . Financial resource strain: Not on file  . Food insecurity - worry: Not on file  . Food insecurity - inability: Not on file  . Transportation needs - medical: Not on file  .  Transportation needs - non-medical: Not on file  Occupational History  . Occupation: Retired  Tobacco Use  . Smoking status: Never Smoker  . Smokeless tobacco: Never Used  . Tobacco comment: Never smoked  Substance and Sexual Activity  . Alcohol use: No    Alcohol/week: 0.0 oz  . Drug use: No  . Sexual activity: Not on file  Other Topics Concern  . Not on file  Social History Narrative  . Not on file    Review of Systems: See HPI, otherwise negative ROS  Physical Exam: BP 128/71   Pulse 93   Temp (!) 97.4 F (36.3 C) (Oral)   Ht 5\' 4"  (1.626 m)   Wt 167 lb 6.4 oz (75.9 kg)   BMI 28.73 kg/m  General:   Alert,  , pleasant and cooperative in NAD Neck:  Supple; no masses or thyromegaly. No significant cervical adenopathy. Lungs:  Clear throughout to auscultation.   No wheezes, crackles, or rhonchi. No acute distress. Heart:  Regular rate and rhythm; no murmurs, clicks, rubs,  or gallops. Abdomen: Non-distended, normal bowel sounds.  Soft and nontender without appreciable mass or hepatosplenomegaly.  Pulses:  Normal pulses noted. Extremities:  Without clubbing or edema.  Impression:  Pleasant 83 year old lady with chronic pancreatitis and pancreatic exocrine and endocrine insufficiency doing  well. The aside from 2 episodes of diarrhea, most of the time she does well. Intermittent esophageal dysphasia in the setting of a prior prior distal esophageal stricture and known dysmotility.  Recommendations:  Continue pancreatic enzymes and aspiration therapy as the benefits outweigh the risks.  We'll obtain a barium pill esophagram to further evaluate her dysphagia.  Further recommendations to follow.   Notice: This dictation was prepared with Dragon dictation along with smaller phrase technology. Any transcriptional errors that result from this process are unintentional and may not be corrected upon review.

## 2016-12-31 NOTE — Patient Instructions (Addendum)
Continue omeprazole and Creon Daily  Will Schedule Barium pill esophogram to evaluate dysphagia  Further recommendations to follow

## 2017-01-01 ENCOUNTER — Ambulatory Visit (INDEPENDENT_AMBULATORY_CARE_PROVIDER_SITE_OTHER): Payer: Medicare Other | Admitting: Nurse Practitioner

## 2017-01-01 ENCOUNTER — Encounter: Payer: Self-pay | Admitting: Nurse Practitioner

## 2017-01-01 ENCOUNTER — Telehealth: Payer: Self-pay

## 2017-01-01 VITALS — BP 132/74 | Ht 62.0 in | Wt 165.0 lb

## 2017-01-01 DIAGNOSIS — Z0000 Encounter for general adult medical examination without abnormal findings: Secondary | ICD-10-CM | POA: Diagnosis not present

## 2017-01-01 MED ORDER — KETOCONAZOLE 2 % EX CREA: 1.0000 "application " | TOPICAL_CREAM | Freq: Two times a day (BID) | CUTANEOUS | 0 refills | Status: DC

## 2017-01-01 MED ORDER — TRIAMCINOLONE ACETONIDE 0.1 % EX CREA: 1.0000 "application " | TOPICAL_CREAM | Freq: Two times a day (BID) | CUTANEOUS | 0 refills | Status: DC

## 2017-01-01 NOTE — Telephone Encounter (Signed)
Refill request for imipramine hcl 10 mg tabs #90 day supply, Sig Take 1 tablet po qd at bedtime. Rx was sent in 09/30/16 with 3 rfs,  Faxed back approval to [facility] for Imipramine hcl 1o mg # 90 with 3 rfs, take 1 tab po qg at bedtime.

## 2017-01-01 NOTE — Progress Notes (Signed)
Subjective:    Patient ID: Deborah Gray, female    DOB: [birth year], 83 y.o.   MRN: [id number]  HPI AWV- Annual Wellness Visit  The patient was seen for their annual wellness visit. The patient's past medical history, surgical history, and family history were reviewed. Pertinent vaccines were reviewed ( tetanus, pneumonia, shingles, flu) The patient's medication list was reviewed and updated.  The height and weight were entered. The patient's current BMI is: 30.18  Cognitive screening was completed. Outcome of Mini - Cog: pass  Falls within the past 6 months: none  Current tobacco usage: none (All patients who use tobacco were given written and verbal information on quitting)  Recent listing of emergency department/hospitalizations over the past year were reviewed.  current specialist the patient sees on a regular basis: Dr. Jeffie Pollock and Dr. Kristeen Mans annual wellness visit patient questionnaire was reviewed.  A written screening schedule for the patient for the next 5-10 years was given. Appropriate discussion of followup regarding next visit was discussed.  Pt reports overall good health. She sees Dr. Gala Romney with gastroenterology and Dr.Wrenn with urology regularly. She reports she had a colonoscopy in 2017 with no issues noted. She reports annual mammograms with no issues noted. She states she has annual vision exams and regular dental exams with no concerns today.  Most recent DEXA 12/2015. She reports following a strict diet with low carbohydrates and low sodium and monitors her blood sugar daily. She states her blood sugars have been 115-130 on average with no episodes of hypoglycemia. She reports exercising on her stationary bike every night for 25 minutes at a time and daily stationary stretching exercises. She states she has had some dizziness in the past 6 months when moving from a seated to standing position quickly, but denies falls or syncope- she reports moving  slowly has helped this. She is not currently sexually active- no new sexual partners. She denies vaginal bleeding, discharge, pain, or changes to perineal area. She states she does have occasional light bladder leakage with exercise or straining, but denies it being of concern to her today. She states she has occasional sinus and allergy symptoms that resolve with daily allegra and mucinex when symptoms are severe. She reports a small area of erythema and pruritis on her left lower abdomen. She states the area has been present for "several months". She states she believes the area to be contact dermatitis and has tried several OTC and prescription creams, but the area persists.   Review of Systems  Constitutional: Negative for activity change, appetite change, fatigue and unexpected weight change.  HENT: Negative for ear pain, postnasal drip, rhinorrhea, sinus pressure, sinus pain and sore throat.   Respiratory: Negative for cough, chest tightness and shortness of breath.   Cardiovascular: Negative for chest pain, palpitations and leg swelling.  Gastrointestinal: Positive for abdominal pain and nausea. Negative for abdominal distention, constipation and diarrhea.  Genitourinary: Negative for dysuria, frequency, genital sores, pelvic pain, urgency, vaginal bleeding, vaginal discharge and vaginal pain.  Musculoskeletal: Negative for arthralgias and gait problem.  Skin: Positive for rash. Negative for color change.  Neurological: Negative for dizziness, weakness and headaches.  Psychiatric/Behavioral: Negative for dysphoric mood and sleep disturbance. The patient is not nervous/anxious.    Depression screen PHQ 2/9 01/01/2017  Decreased Interest 0  Down, Depressed, Hopeless 0  PHQ - 2 Score 0  Some recent data might be hidden       Objective:   Physical  Exam  Constitutional: She is oriented to person, place, and time. She appears well-developed. No distress.  HENT:  Head: Normocephalic.    Right Ear: External ear normal.  Left Ear: External ear normal.  Mouth/Throat: Oropharynx is clear and moist.  Eyes: Pupils are equal, round, and reactive to light.  Neck: Normal range of motion. Neck supple. No tracheal deviation present. No thyromegaly present.  Cardiovascular: Normal rate, regular rhythm and normal heart sounds. Exam reveals no gallop.  No murmur heard. Pulmonary/Chest: Effort normal and breath sounds normal. No respiratory distress. She has no wheezes.  Breasts equal in size and shape bilaterally, no deformity or skin changes noted on inspection. No masses, tenderness, nodularity, edema, or nipple discharge noted on palpation bilaterally. No axillary adenopathy noted bilaterally.    Abdominal: Soft. Bowel sounds are normal. She exhibits no distension. There is no tenderness.  Genitourinary: Vagina normal and uterus normal. No vaginal discharge found.  Genitourinary Comments: External GU:no lesions, rashes, or discharge noted. Vagina: no discharge. Cervix normal in appearance. No CMT. Bimanual exam: no tenderness or obvious masses.   Musculoskeletal: She exhibits no edema.  Lymphadenopathy:    She has no cervical adenopathy.  Neurological: She is alert and oriented to person, place, and time.  Skin: Skin is warm and dry. Rash noted.  Confluent shiny faint pink circular rash lower left abdominal fold.   Psychiatric: She has a normal mood and affect. Her behavior is normal.  Vitals reviewed.  Vitals:   01/01/17 1311  BP: 132/74  Weight: 165 lb (74.8 kg)  Height: 5\' 2"  (1.575 m)   Results for orders placed or performed in visit on 12/19/16  Lipid panel  Result Value Ref Range   Cholesterol, Total 189 100 - 199 mg/dL   Triglycerides 117 0 - 149 mg/dL   HDL 54 >39 mg/dL   VLDL Cholesterol Cal 23 5 - 40 mg/dL   LDL Calculated 112 (H) 0 - 99 mg/dL   Chol/HDL Ratio 3.5 0.0 - 4.4 ratio  Hepatic function panel  Result Value Ref Range   Total Protein 6.8 6.0 - 8.5  g/dL   Albumin 4.5 3.5 - 4.7 g/dL   Bilirubin Total 0.5 0.0 - 1.2 mg/dL   Bilirubin, Direct 0.12 0.00 - 0.40 mg/dL   Alkaline Phosphatase 74 39 - 117 IU/L   AST 11 0 - 40 IU/L   ALT 13 0 - 32 IU/L  Hemoglobin A1c  Result Value Ref Range   Hgb A1c MFr Bld 6.7 (H) 4.8 - 5.6 %   Est. average glucose Bld gHb Est-mCnc 146 mg/dL      Assessment & Plan:  1. Routine general medical examination at a health care facility - Pt praised for healthy, active lifestyle - Continue taking medications as prescribed  - Ketoconazole cream and triamcinolone cream ordered- instructions provided to apply a dime size of both creams to affected area BID; call back if persists 2. Mild fungal skin infection  Meds ordered this encounter  Medications  . ketoconazole (NIZORAL) 2 % cream    Sig: Apply 1 application 2 (two) times daily topically. PRN rash    Dispense:  30 g    Refill:  0    Order Specific Question:   Supervising Provider    Answer:   [name] [[id number]]  . triamcinolone cream (KENALOG) 0.1 %    Sig: Apply 1 application 2 (two) times daily topically. Prn rash; use up to 2 weeks    Dispense:  30 g    Refill:  0    Order Specific Question:   Supervising Provider    Answer:   [name] [[id number]]

## 2017-01-02 ENCOUNTER — Encounter: Payer: Medicare Other | Admitting: Nurse Practitioner

## 2017-01-03 ENCOUNTER — Ambulatory Visit (HOSPITAL_COMMUNITY)
Admission: RE | Admit: 2017-01-03 | Discharge: 2017-01-03 | Disposition: A | Discharge status: Home / Self Care | Payer: Medicare Other | Source: Ambulatory Visit | Attending: Internal Medicine | Admitting: Internal Medicine

## 2017-01-03 ENCOUNTER — Encounter (HOSPITAL_COMMUNITY): Payer: Self-pay | Admitting: *Deleted

## 2017-01-03 ENCOUNTER — Emergency Department (HOSPITAL_COMMUNITY)
Admission: EM | Admit: 2017-01-03 | Discharge: 2017-01-03 | Disposition: A | Discharge status: Home / Self Care | Payer: Medicare Other | Attending: Emergency Medicine | Admitting: Emergency Medicine

## 2017-01-03 ENCOUNTER — Other Ambulatory Visit: Payer: Self-pay

## 2017-01-03 DIAGNOSIS — Z7984 Long term (current) use of oral hypoglycemic drugs: Secondary | ICD-10-CM | POA: Insufficient documentation

## 2017-01-03 DIAGNOSIS — R1310 Dysphagia, unspecified: Secondary | ICD-10-CM

## 2017-01-03 DIAGNOSIS — K228 Other specified diseases of esophagus: Secondary | ICD-10-CM

## 2017-01-03 DIAGNOSIS — R1013 Epigastric pain: Secondary | ICD-10-CM

## 2017-01-03 DIAGNOSIS — Z7982 Long term (current) use of aspirin: Secondary | ICD-10-CM | POA: Diagnosis not present

## 2017-01-03 DIAGNOSIS — E119 Type 2 diabetes mellitus without complications: Secondary | ICD-10-CM | POA: Insufficient documentation

## 2017-01-03 DIAGNOSIS — I10 Essential (primary) hypertension: Secondary | ICD-10-CM | POA: Insufficient documentation

## 2017-01-03 DIAGNOSIS — R109 Unspecified abdominal pain: Secondary | ICD-10-CM | POA: Diagnosis present

## 2017-01-03 DIAGNOSIS — K449 Diaphragmatic hernia without obstruction or gangrene: Secondary | ICD-10-CM

## 2017-01-03 DIAGNOSIS — Z79899 Other long term (current) drug therapy: Secondary | ICD-10-CM | POA: Diagnosis not present

## 2017-01-03 LAB — COMPREHENSIVE METABOLIC PANEL
ALT: 19 U/L (ref 14–54)
AST: 17 U/L (ref 15–41)
Albumin: 4.2 g/dL (ref 3.5–5.0)
Alkaline Phosphatase: 67 U/L (ref 38–126)
Anion gap: 9 (ref 5–15)
BILIRUBIN TOTAL: 0.7 mg/dL (ref 0.3–1.2)
BUN: 21 mg/dL — ABNORMAL HIGH (ref 6–20)
CHLORIDE: 98 mmol/L — AB (ref 101–111)
CO2: 28 mmol/L (ref 22–32)
Calcium: 9.6 mg/dL (ref 8.9–10.3)
Creatinine, Ser: 0.62 mg/dL (ref 0.44–1.00)
GFR calc non Af Amer: >60 mL/min (ref >60)
Glucose, Bld: 161 mg/dL — ABNORMAL HIGH (ref 65–99)
POTASSIUM: 3.7 mmol/L (ref 3.5–5.1)
Sodium: 135 mmol/L (ref 135–145)
TOTAL PROTEIN: 7.1 g/dL (ref 6.5–8.1)

## 2017-01-03 LAB — CBC WITH DIFFERENTIAL/PLATELET
Basophils Absolute: 0 10*3/uL (ref 0.0–0.1)
Basophils Relative: 0 %
EOS PCT: 1 %
Eosinophils Absolute: 0.1 10*3/uL (ref 0.0–0.7)
HEMATOCRIT: 41.2 % (ref 36.0–46.0)
Hemoglobin: 13.1 g/dL (ref 12.0–15.0)
LYMPHS ABS: 1.5 10*3/uL (ref 0.7–4.0)
Lymphocytes Relative: 15 %
MCH: 29 pg (ref 26.0–34.0)
MCHC: 31.8 g/dL (ref 30.0–36.0)
MCV: 91.2 fL (ref 78.0–100.0)
MONO ABS: 0.3 10*3/uL (ref 0.1–1.0)
MONOS PCT: 3 %
NEUTROS ABS: 8.1 10*3/uL — AB (ref 1.7–7.7)
Neutrophils Relative %: 81 %
PLATELETS: 223 10*3/uL (ref 150–400)
RBC: 4.52 MIL/uL (ref 3.87–5.11)
RDW: 14.2 % (ref 11.5–15.5)
WBC: 10 10*3/uL (ref 4.0–10.5)

## 2017-01-03 LAB — LIPASE, BLOOD: LIPASE: 15 U/L (ref 11–51)

## 2017-01-03 MED ORDER — ONDANSETRON HCL 4 MG/2ML IJ SOLN
4.0000 mg | Freq: Once | INTRAMUSCULAR | Status: AC
  Administered 2017-01-03: 4 mg via INTRAVENOUS
  Filled 2017-01-03: qty 2

## 2017-01-03 MED ORDER — SODIUM CHLORIDE 0.9 % IV BOLUS (SEPSIS)
500.0000 mL | Freq: Once | INTRAVENOUS | Status: AC
  Administered 2017-01-03: 500 mL via INTRAVENOUS

## 2017-01-03 NOTE — ED Triage Notes (Signed)
Had barium swallow today and now had pain around her umbilical area

## 2017-01-03 NOTE — ED Notes (Signed)
Patient states her temperature is always "low".

## 2017-01-03 NOTE — Discharge Instructions (Signed)
Your blood work was good.  Recommend MiraLAX or magnesium citrate.  Clear liquids tonight.

## 2017-01-03 NOTE — ED Provider Notes (Signed)
[facility] EMERGENCY DEPARTMENT Provider Note   CSN: [id number] Arrival date & time: 01/03/17  1541     History   Chief Complaint Chief Complaint  Patient presents with  . Abdominal Pain    HPI Alabama Archbold is a 83 y.o. female.  Status post barium swallow today secondary to history of esophageal stricture.  Patient now complains of epigastric pain.  She has been drinking fluids with no vomiting, diarrhea, dysuria, fever, sweats, chills, chest pain, dyspnea.  She takes Creon for pancreatic enzyme replacement.  Severity of pain is mild.  She is ambulatory.      Past Medical History:  Diagnosis Date  . Arthritis   . Benign neoplasm of colon   . Constipation   . Diaphragmatic hernia without mention of obstruction or gangrene   . Diverticulosis of colon (without mention of hemorrhage)   . Dysphagia, pharyngoesophageal phase   . Esophageal reflux   . Essential hypertension   . Heart murmur   . Hemorrhoids   . Hyperlipidemia   . Internal hemorrhoids without mention of complication   . PONV (postoperative nausea and vomiting)   . Stricture and stenosis of esophagus   . Type 2 diabetes mellitus ([place])   . Unspecified gastritis and gastroduodenitis without mention of hemorrhage     Patient Active Problem List   Diagnosis Date Noted  . Bloating 08/30/2015  . Exertional chest pain 08/16/2015  . Chest pain 08/16/2015  . Mucosal abnormality of stomach   . Hemorrhoids 04/18/2015  . IBS (irritable bowel syndrome) 01/30/2015  . Type 2 diabetes mellitus ([place]) 12/22/2014  . Hyperlipidemia 12/22/2014  . Prolapse of female pelvic organs 11/28/2013  . Precordial pain 11/25/2013  . Essential hypertension 11/25/2013  . Heart murmur 11/25/2013  . Osteopenia 10/23/2012  . Chronic pancreatitis ([place]) 02/26/2011  . GERD (gastroesophageal reflux disease) 01/18/2011  . Constipation 11/29/2010  . Esophageal spasm 11/29/2010  . DJD (degenerative joint disease) 11/29/2010  . Hx  of adenomatous colonic polyps 06/29/2009  . ESOPHAGEAL MOTILITY DISORDER 04/01/2008  . DM 03/01/2008  . HEMORRHOIDS, INTERNAL 07/02/2007  . Diverticulosis of large intestine 07/02/2007    Past Surgical History:  Procedure Laterality Date  . ABDOMINAL HYSTERECTOMY    . BACTERIAL OVERGROWTH TEST N/A 09/06/2015   Performed by Daneil Dolin, MD at [place]  . BREAST LUMPECTOMY Right    benign  . CATARACT EXTRACTION Bilateral 2006   Implants in both, surgeries done 6 weeks apart  . CHOLECYSTECTOMY    . COLONOSCOPY  03/08/02   [name]: diverticulosis, internal hemorrhoids, adenomatous colon polyp  . COLONOSCOPY  01/23/04   [name]: diverticulosis, internal hemorrhoids  . COLONOSCOPY  09/25/05   [name]: diverticulosis  . COLONOSCOPY  07/05/09   [name]: severe diverticulosis  . COLONOSCOPY  01/21/11   [name]: severe diverticulosis in sigmoid to desc colon, int hemorrhoids, follow up TCS in 5 years  . COLONOSCOPY N/A 04/10/2016   Performed by Daneil Dolin, MD at [place]  . DILATION AND CURETTAGE OF UTERUS    . ESOPHAGEAL MANOMETRY  03/21/08   [place]: findings c/w [place] Esophagus  . ESOPHAGOGASTRODUODENOSCOPY  03/08/02   [name]: esophageal stricture, chronic gerd, s/p dilation.   . ESOPHAGOGASTRODUODENOSCOPY  01/23/04   [name]: esophageal stricture, gastritis, hiatal hernia  . ESOPHAGOGASTRODUODENOSCOPY  09/25/05   [place]: gastritis, benign bx, no H.pylori  . ESOPHAGOGASTRODUODENOSCOPY  07/05/09   [facility]: gastropathy, benign small bowel bx and gastric bx  . ESOPHAGOGASTRODUODENOSCOPY (EGD) N/A 05/15/2015  Performed by Daneil Dolin, MD at [place]  . FOOT SURGERY Left   . HEMORRHOID BANDING  2017   Dr.[name]  . LAPAROSCOPIC VAGINAL HYSTERECTOMY    . RECTOCELE REPAIR    . TOTAL KNEE ARTHROPLASTY Right     OB History    Gravida Para Term Preterm AB Living   4 3 3   1 3    SAB TAB Ectopic Multiple Live Births   1               Home  Medications    Prior to Admission medications   Medication Sig Start Date End Date Taking? Authorizing Provider  acetaminophen (TYLENOL) 500 MG tablet Take 500 mg at bedtime by mouth.    Yes [provider]  aspirin EC 81 MG tablet Take 81 mg by mouth at bedtime.   Yes [provider]  azelastine (ASTELIN) 0.1 % nasal spray Place 1 spray into both nostrils 2 (two) times daily as needed for rhinitis.   Yes [provider]  Carboxymethylcellulose Sodium (THERATEARS) 0.25 % SOLN Apply 1 drop 2 (two) times daily to eye.   Yes [provider]  clidinium-chlordiazePOXIDE (LIBRAX) 5-2.5 MG capsule Take 1 capsule as needed by mouth (FOR GASTRIC ISSUES).    Yes [provider]  ezetimibe (ZETIA) 10 MG tablet TAKE 1 TABLET (10 MG TOTAL) BY MOUTH DAILY. 10/29/16  Yes [name], MD  guaiFENesin (MUCINEX) 600 MG 12 hr tablet Take 600 mg by mouth 2 (two) times daily as needed for cough or to loosen phlegm.    Yes [provider]  imipramine (TOFRANIL) 10 MG tablet Take 1 tablet (10 mg total) by mouth at bedtime. 09/30/16  Yes Mahala Menghini, PA-C  isometheptene-acetaminophen-dichloralphenazone (MIDRIN) [phone] MG capsule TAKE ONE CAPSULE BY MOUTH 4 TIMES A DAY AS NEEDED FOR HEADACHE 05/20/16  Yes [name], MD  losartan (COZAAR) 50 MG tablet Take 1 tablet (50 mg total) by mouth daily. Patient taking differently: Take 50 mg every evening by mouth.  11/11/16  Yes [name], MD  meclizine (ANTIVERT) 25 MG tablet TAKE 1 TABLET (25 MG TOTAL) BY MOUTH 3 (THREE) TIMES DAILY AS NEEDED FOR DIZZINESS OR NAUSEA. Patient taking differently: Take 25-50 mg 3 (three) times daily as needed by mouth for dizziness or nausea. TAKE 1 TABLET (25 MG TOTAL) BY MOUTH 3 (THREE) TIMES DAILY AS NEEDED FOR DIZZINESS OR NAUSEA. 07/24/16  Yes [name], MD  metFORMIN (GLUCOPHAGE) 500 MG tablet TAKE 1 TABLET (500 MG TOTAL) BY MOUTH 2 (TWO) TIMES DAILY. 10/29/16   Yes [name], MD  Meth-Hyo-M Barnett Hatter Phos-Ph Sal (URIBEL) 118 MG CAPS Take 118 mg 2 (two) times daily by mouth.    Yes [provider]  mirabegron ER (MYRBETRIQ) 25 MG TB24 tablet Take 50 mg every morning by mouth.    Yes [provider]  Multiple Vitamins-Minerals (PRESERVISION AREDS 2) CAPS Take 1 capsule by mouth 2 (two) times daily.    Yes [provider]  nateglinide (STARLIX) 120 MG tablet TAKE 1 TABLET BY MOUTH 3 TIMES A DAY BEFORE MEALS DUE 4-3 Patient taking differently: TAKE 1 TABLET BY MOUTH 3 TIMES A DAY BEFORE MEALS 08/12/16  Yes [name], MD  omega-3 acid ethyl esters (LOVAZA) 1 g capsule Take 4 g by mouth daily.   Yes [provider]  omeprazole (PRILOSEC) 20 MG capsule TAKE ONE CAPSULE BY MOUTH EVERY MORNING BEFORE BREAKFAST  08/13/16  Yes Annitta Needs, NP  Pancrelipase, Lip-Prot-Amyl, 24000-76000 units CPEP Take 1-3 capsules 5 (five) times daily by mouth. Pt takes 3 capsules three times daily with meals and one capsule with snack   Yes [provider]  vitamin E 400 UNIT capsule Take 400 Units by mouth 4 (four) times a week. Pt takes on Tuesday, Thursday, Saturday, and Sunday.   Yes [provider]  Vitamin Mixture (ESTER-C) 500-60 MG TABS Take 1 tablet by mouth 4 (four) times a week. Pt takes on Sunday, Monday, Wednesday, and Friday.   Yes [provider]  Wheat Dextrin (BENEFIBER PO) Take 1 packet by mouth daily.    Yes [provider]  ketoconazole (NIZORAL) 2 % cream Apply 1 application 2 (two) times daily topically. PRN rash 01/01/17   Nilda Simmer, NP  triamcinolone cream (KENALOG) 0.1 % Apply 1 application 2 (two) times daily topically. Prn rash; use up to 2 weeks 01/01/17   Nilda Simmer, NP    Family History Family History  Problem Relation Age of Onset  . Ovarian cancer Mother   . Diabetes Father   . Heart disease Father   . Breast cancer Sister   . Liver cancer Sister     . Colon cancer Cousin        Paternal side  . Diabetes Maternal Aunt   . Liver disease Cousin        Maternal side, never drank    Social History Social History   Tobacco Use  . Smoking status: Never Smoker  . Smokeless tobacco: Never Used  . Tobacco comment: Never smoked  Substance Use Topics  . Alcohol use: No    Alcohol/week: 0.0 oz  . Drug use: No     Allergies   Adhesive [tape]; Estrogens; and Sulfonamide derivatives   Review of Systems Review of Systems  All other systems reviewed and are negative.    Physical Exam Updated Vital Signs BP (!) 127/59 (BP Location: Left Arm)   Pulse 89   Temp (!) 97.5 F (36.4 C) (Oral)   Resp 18   Ht 5\' 4"  (1.626 m)   Wt 74.8 kg (165 lb)   SpO2 96%   BMI 28.32 kg/m   Physical Exam  Constitutional: She is oriented to person, place, and time. She appears well-developed and well-nourished.  HENT:  Head: Normocephalic and atraumatic.  Eyes: Conjunctivae are normal.  Neck: Neck supple.  Cardiovascular: Normal rate and regular rhythm.  Pulmonary/Chest: Effort normal and breath sounds normal.  Abdominal: Soft. Bowel sounds are normal.  Minimal epigastric tenderness.  Musculoskeletal: Normal range of motion.  Neurological: She is alert and oriented to person, place, and time.  Skin: Skin is warm and dry.  Psychiatric: She has a normal mood and affect. Her behavior is normal.  Nursing note and vitals reviewed.    ED Treatments / Results  Labs (all labs ordered are listed, but only abnormal results are displayed) Labs Reviewed  CBC WITH DIFFERENTIAL/PLATELET - Abnormal; Notable for the following components:      Result Value   Neutro Abs 8.1 (*)    All other components within normal limits  COMPREHENSIVE METABOLIC PANEL - Abnormal; Notable for the following components:   Chloride 98 (*)    Glucose, Bld 161 (*)    BUN 21 (*)    All other components within normal limits  LIPASE, BLOOD    EKG  EKG  Interpretation None  Radiology Dg Esophagus  Result Date: 01/03/2017 CLINICAL DATA:  Dysphagia EXAM: ESOPHOGRAM / BARIUM SWALLOW / BARIUM TABLET STUDY TECHNIQUE: Combined double contrast and single contrast examination performed using effervescent crystals, thick barium liquid, and thin barium liquid. The patient was observed with fluoroscopy swallowing a 13 mm barium sulphate tablet. FLUOROSCOPY TIME:  Fluoroscopy Time:  1 minutes 12 second Radiation Exposure Index (if provided by the fluoroscopic device): Number of Acquired Spot Images: 0 COMPARISON:  None. FINDINGS: Mild esophageal dysmotility with mild dilatation of the esophagus proximally and decreased motility. Negative for stricture or mass.  No mucosal lesion. Small hiatal hernia without reflux. Barium tablet passed readily into the stomach without delay IMPRESSION: Mild presbyesophagus Small hiatal hernia without reflux or stricture. Electronically Signed   By: Franchot Gallo M.D.   On: 01/03/2017 08:56    Procedures Procedures (including critical care time)  Medications Ordered in ED Medications  sodium chloride 0.9 % bolus 500 mL (0 mLs Intravenous Stopped 01/03/17 1851)  ondansetron (ZOFRAN) injection 4 mg (4 mg Intravenous Given 01/03/17 1730)     Initial Impression / Assessment and Plan / ED Course  I have reviewed the triage vital signs and the nursing notes.  Pertinent labs & imaging results that were available during my care of the patient were reviewed by me and considered in my medical decision making (see chart for details).     Patient presents with epigastric pain status post barium swallow today.  Glucose slightly elevated, but liver functions and lipase were acceptable.  Patient is ambulatory.  Barium swallow reviewed.  Final Clinical Impressions(s) / ED Diagnoses   Final diagnoses:  Epigastric pain    ED Discharge Orders    None       Nat Christen, MD 01/03/17 2132

## 2017-01-07 ENCOUNTER — Encounter: Payer: Self-pay | Admitting: Nurse Practitioner

## 2017-01-13 ENCOUNTER — Telehealth: Payer: Self-pay | Admitting: Family Medicine

## 2017-01-13 DIAGNOSIS — I83819 Varicose veins of unspecified lower extremities with pain: Secondary | ICD-10-CM

## 2017-01-13 NOTE — Telephone Encounter (Signed)
Order for referral put in.  

## 2017-01-13 NOTE — Telephone Encounter (Signed)
Ok lets do 

## 2017-01-13 NOTE — Telephone Encounter (Signed)
Pt requesting referral to vain specialist States she's been wearing the support hose Dr. Richardson Landry recommended and they don't seem to be helping much States she's having right much pain behind her left knee  Please advise  (Monday's in December are not good for her)

## 2017-01-14 ENCOUNTER — Encounter: Payer: Self-pay | Admitting: Family Medicine

## 2017-01-15 ENCOUNTER — Other Ambulatory Visit (HOSPITAL_COMMUNITY)
Admission: AD | Admit: 2017-01-15 | Discharge: 2017-01-15 | Disposition: A | Discharge status: Home / Self Care | Payer: Medicare Other | Source: Skilled Nursing Facility | Attending: Urology | Admitting: Urology

## 2017-01-15 ENCOUNTER — Ambulatory Visit (INDEPENDENT_AMBULATORY_CARE_PROVIDER_SITE_OTHER): Payer: Medicare Other | Admitting: Urology

## 2017-01-15 DIAGNOSIS — N3001 Acute cystitis with hematuria: Secondary | ICD-10-CM | POA: Insufficient documentation

## 2017-01-15 DIAGNOSIS — N3020 Other chronic cystitis without hematuria: Secondary | ICD-10-CM

## 2017-01-15 DIAGNOSIS — N3010 Interstitial cystitis (chronic) without hematuria: Secondary | ICD-10-CM | POA: Diagnosis not present

## 2017-01-18 LAB — URINE CULTURE

## 2017-01-22 ENCOUNTER — Other Ambulatory Visit: Payer: Self-pay

## 2017-01-22 MED ORDER — METFORMIN HCL 500 MG PO TABS: ORAL_TABLET | ORAL | 1 refills | Status: DC

## 2017-01-22 MED ORDER — EZETIMIBE 10 MG PO TABS: ORAL_TABLET | ORAL | 1 refills | Status: DC

## 2017-01-24 ENCOUNTER — Other Ambulatory Visit (HOSPITAL_COMMUNITY)
Admission: AD | Admit: 2017-01-24 | Discharge: 2017-01-24 | Disposition: A | Discharge status: Home / Self Care | Payer: Medicare Other | Source: Other Acute Inpatient Hospital | Attending: Urology | Admitting: Urology

## 2017-01-24 ENCOUNTER — Other Ambulatory Visit: Payer: Self-pay | Admitting: *Deleted

## 2017-01-24 ENCOUNTER — Telehealth: Payer: Self-pay | Admitting: Internal Medicine

## 2017-01-24 DIAGNOSIS — N390 Urinary tract infection, site not specified: Secondary | ICD-10-CM | POA: Insufficient documentation

## 2017-01-24 DIAGNOSIS — K861 Other chronic pancreatitis: Secondary | ICD-10-CM

## 2017-01-24 MED ORDER — METFORMIN HCL 500 MG PO TABS: ORAL_TABLET | ORAL | 0 refills | Status: DC

## 2017-01-24 NOTE — Telephone Encounter (Signed)
Pt said that she takes Creon 3 with meals and 2 with snacks (11 pills a day) and she would need a refill for a 90 day supply. She uses [facility] in [place].

## 2017-01-24 NOTE — Telephone Encounter (Signed)
Routing to the refill box. 

## 2017-01-26 LAB — URINE CULTURE: CULTURE: NO GROWTH

## 2017-01-27 MED ORDER — PANCRELIPASE (LIP-PROT-AMYL) 24000-76000 UNITS PO CPEP: ORAL_CAPSULE | ORAL | 2 refills | Status: DC

## 2017-01-27 NOTE — Addendum Note (Signed)
Addended by: Gordy Levan, ERIC A on: 01/27/2017 12:27 PM   Modules accepted: Orders

## 2017-01-27 NOTE — Telephone Encounter (Signed)
Rx sent to pharmacy: 11 pills a day, 90 day supply (990 pills) with a couple refills. Call if any questions

## 2017-01-28 ENCOUNTER — Ambulatory Visit: Payer: Medicare Other | Admitting: Urology

## 2017-01-31 ENCOUNTER — Ambulatory Visit (INDEPENDENT_AMBULATORY_CARE_PROVIDER_SITE_OTHER): Payer: Medicare Other | Admitting: Urology

## 2017-01-31 DIAGNOSIS — N3020 Other chronic cystitis without hematuria: Secondary | ICD-10-CM | POA: Diagnosis not present

## 2017-01-31 DIAGNOSIS — N3010 Interstitial cystitis (chronic) without hematuria: Secondary | ICD-10-CM

## 2017-02-04 DIAGNOSIS — H34832 Tributary (branch) retinal vein occlusion, left eye, with macular edema: Secondary | ICD-10-CM | POA: Diagnosis not present

## 2017-02-04 DIAGNOSIS — E119 Type 2 diabetes mellitus without complications: Secondary | ICD-10-CM | POA: Diagnosis not present

## 2017-02-19 ENCOUNTER — Other Ambulatory Visit (HOSPITAL_COMMUNITY)
Admission: RE | Admit: 2017-02-19 | Discharge: 2017-02-19 | Disposition: A | Discharge status: Home / Self Care | Payer: Medicare Other | Source: Ambulatory Visit | Attending: Urology | Admitting: Urology

## 2017-02-19 DIAGNOSIS — N3010 Interstitial cystitis (chronic) without hematuria: Secondary | ICD-10-CM | POA: Diagnosis present

## 2017-02-21 LAB — URINE CULTURE: Culture: <10000 — AB

## 2017-02-24 ENCOUNTER — Other Ambulatory Visit: Payer: Self-pay | Admitting: Family Medicine

## 2017-02-28 ENCOUNTER — Other Ambulatory Visit (HOSPITAL_COMMUNITY)
Admission: RE | Admit: 2017-02-28 | Discharge: 2017-02-28 | Disposition: A | Discharge status: Home / Self Care | Payer: Medicare Other | Source: Other Acute Inpatient Hospital | Attending: Urology | Admitting: Urology

## 2017-02-28 DIAGNOSIS — N3000 Acute cystitis without hematuria: Secondary | ICD-10-CM | POA: Insufficient documentation

## 2017-03-02 LAB — URINE CULTURE: CULTURE: NO GROWTH

## 2017-03-05 ENCOUNTER — Ambulatory Visit (INDEPENDENT_AMBULATORY_CARE_PROVIDER_SITE_OTHER): Payer: BC Managed Care – PPO | Admitting: Urology

## 2017-03-05 DIAGNOSIS — N3020 Other chronic cystitis without hematuria: Secondary | ICD-10-CM | POA: Diagnosis not present

## 2017-03-07 ENCOUNTER — Telehealth: Payer: Self-pay | Admitting: Family Medicine

## 2017-03-07 ENCOUNTER — Encounter: Payer: Self-pay | Admitting: Family Medicine

## 2017-03-07 ENCOUNTER — Ambulatory Visit (INDEPENDENT_AMBULATORY_CARE_PROVIDER_SITE_OTHER): Payer: Medicare Other | Admitting: Family Medicine

## 2017-03-07 VITALS — BP 130/78 | Temp 98.2°F | Ht 62.0 in | Wt 165.0 lb

## 2017-03-07 DIAGNOSIS — B9689 Other specified bacterial agents as the cause of diseases classified elsewhere: Secondary | ICD-10-CM

## 2017-03-07 DIAGNOSIS — J029 Acute pharyngitis, unspecified: Secondary | ICD-10-CM

## 2017-03-07 DIAGNOSIS — E785 Hyperlipidemia, unspecified: Secondary | ICD-10-CM

## 2017-03-07 DIAGNOSIS — J019 Acute sinusitis, unspecified: Secondary | ICD-10-CM

## 2017-03-07 DIAGNOSIS — I10 Essential (primary) hypertension: Secondary | ICD-10-CM

## 2017-03-07 DIAGNOSIS — E119 Type 2 diabetes mellitus without complications: Secondary | ICD-10-CM

## 2017-03-07 LAB — POCT RAPID STREP A (OFFICE): Rapid Strep A Screen: NEGATIVE

## 2017-03-07 MED ORDER — AMOXICILLIN 500 MG PO CAPS: 500.0000 mg | ORAL_CAPSULE | Freq: Three times a day (TID) | ORAL | 0 refills | Status: DC

## 2017-03-07 MED ORDER — AZELASTINE HCL 0.1 % NA SOLN: 1.0000 | Freq: Two times a day (BID) | NASAL | 11 refills | Status: DC | PRN

## 2017-03-07 NOTE — Telephone Encounter (Signed)
Patient has an appointment in May with Dr. Richardson Landry.  She is requesting labs ordered.

## 2017-03-07 NOTE — Progress Notes (Signed)
   Subjective:    Patient ID: Deborah Gray, female    DOB: 17-Mar-1933, 84 y.o.   MRN: [id number]  HPI  Patient is here today with complaints of sinus congestion and drainage and sore throat.Has a dry cough.She has had this for a week now.She is using Saline and mucinex BID for four days.  Started four d ago   Pos cough and cong and dranage  Throat soer with this also   Pos sig danage    Review of Systems No headache, no major weight loss or weight gain, no chest pain no back pain abdominal pain no change in bowel habits complete ROS otherwise negative     Results for orders placed or performed in visit on 03/07/17  POCT rapid strep A  Result Value Ref Range   Rapid Strep A Screen Negative Negative    Objective:   Physical Exam Alert, mild malaise. Hydration good Vitals stable. frontal/ maxillary tenderness evident positive nasal congestion. pharynx normal neck supple  lungs clear/no crackles or wheezes. heart regular in rhythm        Assessment & Plan:  Impression rhinosinusitis likely post viral, discussed with patient. plan antibiotics prescribed. Questions answered. Symptomatic care discussed. warning signs discussed. WSL  abx use discussed

## 2017-03-08 LAB — STREP A DNA PROBE: Strep Gp A Direct, DNA Probe: NEGATIVE

## 2017-03-08 LAB — SPECIMEN STATUS REPORT

## 2017-03-11 ENCOUNTER — Other Ambulatory Visit: Payer: Self-pay | Admitting: Family Medicine

## 2017-03-14 ENCOUNTER — Telehealth: Payer: Self-pay | Admitting: Family Medicine

## 2017-03-14 MED ORDER — METFORMIN HCL 500 MG PO TABS: ORAL_TABLET | ORAL | 0 refills | Status: DC

## 2017-03-14 NOTE — Telephone Encounter (Addendum)
Patient called an said her blood sugar has been running high.  She said one day it was 173 and she checked yesterday morning it was 203.  She said her sugars usually run around 115-145.  She is taking Nateglinide with meals and metformin twice a day.  Also, she said she has had a UTI and sinus infection since Christmas.  She said if she gets infection it'll run her sugars up.  She is taking amoxicillin 3 times a day and she thinks its running her BS up.  Also, she mentioned she has been having bloating and pain.  She contacted Dr. Roseanne Kaufman office who mentioned her possibly getting a scan, but to call Dr. Lance Sell office about her sugars since we handle this.  At Dr. Jeannine Kitten request, I told her to call Dr. Roseanne Kaufman office back about the bloating and pain.  Call [phone] if unable to reach at home number.

## 2017-03-14 NOTE — Telephone Encounter (Signed)
Lip liv m7 A1c  

## 2017-03-14 NOTE — Telephone Encounter (Signed)
incr metformin to two tabs bid/ f u visit here in two weeks to see response to metfor in, have g I docs first w u pts abd symtoms

## 2017-03-14 NOTE — Telephone Encounter (Signed)
Patient notified and verbalized understanding. 

## 2017-03-14 NOTE — Telephone Encounter (Signed)
Left message to return call (medication sent electronically to pharmacy)

## 2017-03-14 NOTE — Telephone Encounter (Signed)
Blood work ordered in Epic. Patient notified. 

## 2017-03-21 ENCOUNTER — Encounter: Payer: Self-pay | Admitting: Family Medicine

## 2017-03-21 ENCOUNTER — Ambulatory Visit (INDEPENDENT_AMBULATORY_CARE_PROVIDER_SITE_OTHER): Payer: Medicare Other | Admitting: Family Medicine

## 2017-03-21 VITALS — BP 132/84 | Temp 97.8°F | Ht 62.0 in | Wt 163.4 lb

## 2017-03-21 DIAGNOSIS — B9689 Other specified bacterial agents as the cause of diseases classified elsewhere: Secondary | ICD-10-CM

## 2017-03-21 DIAGNOSIS — J019 Acute sinusitis, unspecified: Secondary | ICD-10-CM | POA: Diagnosis not present

## 2017-03-21 MED ORDER — CEFPROZIL 500 MG PO TABS: 500.0000 mg | ORAL_TABLET | Freq: Two times a day (BID) | ORAL | 0 refills | Status: DC

## 2017-03-21 NOTE — Progress Notes (Signed)
Subjective:     Patient ID: Deborah Gray, female   DOB: Jul 04, 1933, 84 y.o.   MRN: [id number]  HPI Pt comes in today with congestion, right ear pain and hoarseness. Pt states throat is not as sore. Pt was here two weeks ago for same issue. Amoxicillin was prescribed and pt has finished.  Took a full round of amox and is still having sig prob  Coughing up sphlegn  Ear pain shapr, at times aching worse wih cough     Review of Systems No headache, no major weight loss or weight gain, no chest pain no back pain abdominal pain no change in bowel habits complete ROS otherwise negative     Objective:   Physical Exam    Alert, mild malaise. Hydration good Vitals stable. frontal/ maxillary tenderness evident positive nasal congestion. pharynx normal neck supple  lungs clear/no crackles or wheezes. heart regular in rhythm  Assessment:       Plan:     Impression rhinosinusitis likely post viral, discussed with patient. plan antibiotics prescribed. Questions answered. Symptomatic care discussed. warning signs discussed. WSL

## 2017-03-24 DIAGNOSIS — H34832 Tributary (branch) retinal vein occlusion, left eye, with macular edema: Secondary | ICD-10-CM | POA: Diagnosis not present

## 2017-03-25 DIAGNOSIS — E119 Type 2 diabetes mellitus without complications: Secondary | ICD-10-CM | POA: Diagnosis not present

## 2017-04-02 ENCOUNTER — Ambulatory Visit (INDEPENDENT_AMBULATORY_CARE_PROVIDER_SITE_OTHER): Payer: Medicare Other | Admitting: Urology

## 2017-04-02 DIAGNOSIS — N3020 Other chronic cystitis without hematuria: Secondary | ICD-10-CM

## 2017-04-11 ENCOUNTER — Telehealth: Payer: Self-pay | Admitting: Family Medicine

## 2017-04-11 MED ORDER — METFORMIN HCL 500 MG PO TABS: ORAL_TABLET | ORAL | 0 refills | Status: DC

## 2017-04-11 NOTE — Telephone Encounter (Signed)
Prescription sent electronically to pharmacy. Patient notified. 

## 2017-04-11 NOTE — Telephone Encounter (Signed)
Patient is needing a refill on her Metformin 500 mg tab.  She said she was originally taking 2 tablets a day, but Dr. Richardson Landry told her to start taking 4 a day.  She is requesting new Rx sent to pharmacy.  [facility]

## 2017-04-17 DIAGNOSIS — Z961 Presence of intraocular lens: Secondary | ICD-10-CM | POA: Diagnosis not present

## 2017-04-18 ENCOUNTER — Encounter: Payer: Self-pay | Admitting: Internal Medicine

## 2017-04-18 ENCOUNTER — Ambulatory Visit (INDEPENDENT_AMBULATORY_CARE_PROVIDER_SITE_OTHER): Payer: Medicare Other | Admitting: Internal Medicine

## 2017-04-18 VITALS — BP 114/67 | HR 89 | Temp 97.0°F | Ht 64.0 in | Wt 161.6 lb

## 2017-04-18 DIAGNOSIS — K8689 Other specified diseases of pancreas: Secondary | ICD-10-CM | POA: Diagnosis not present

## 2017-04-18 DIAGNOSIS — K219 Gastro-esophageal reflux disease without esophagitis: Secondary | ICD-10-CM | POA: Diagnosis not present

## 2017-04-18 DIAGNOSIS — K641 Second degree hemorrhoids: Secondary | ICD-10-CM | POA: Diagnosis not present

## 2017-04-18 LAB — HM DIABETES EYE EXAM

## 2017-04-18 NOTE — Progress Notes (Signed)
Primary Care Physician:  [name], MD Primary Gastroenterologist:  Dr. Gala Romney  Pre-Procedure History & Physical: HPI:  Deborah Gray is a 84 y.o. female here for here for follow-up of the chronic pancreatitis. She continues on daily omeprazole and enzyme supplements. Has 1-2 formed bowel movements daily. Denies diarrhea. Did have some mild bloating and abdominal discomfort for which she made this appointment a few weeks ago. Those symptoms were self-limiting. States she almost canceled the appointment today.  Not having any reflux symptoms.  She takes her time to consume her meals and denies having any dysphagia issues.  No bleeding. States hemorrhoids have come back to some degree has a little bulging from time to time. Some issues with hygiene after bowel movement.   She's lost 5 pounds in conscious effort to achieve better glycemic control.      Past Medical History:  Diagnosis Date  . Arthritis   . Benign neoplasm of colon   . Constipation   . Diaphragmatic hernia without mention of obstruction or gangrene   . Diverticulosis of colon (without mention of hemorrhage)   . Dysphagia, pharyngoesophageal phase   . Esophageal reflux   . Essential hypertension   . Heart murmur   . Hemorrhoids   . Hyperlipidemia   . Internal hemorrhoids without mention of complication   . PONV (postoperative nausea and vomiting)   . Stricture and stenosis of esophagus   . Type 2 diabetes mellitus ([place])   . Unspecified gastritis and gastroduodenitis without mention of hemorrhage     Past Surgical History:  Procedure Laterality Date  . ABDOMINAL HYSTERECTOMY    . BACTERIAL OVERGROWTH TEST N/A 09/06/2015   Procedure: BACTERIAL OVERGROWTH TEST;  Surgeon: Daneil Dolin, MD;  Location: [facility];  Service: Endoscopy;  Laterality: N/A;  800  . BREAST LUMPECTOMY Right    benign  . CATARACT EXTRACTION Bilateral 2006   Implants in both, surgeries done 6 weeks apart  . CHOLECYSTECTOMY     . COLONOSCOPY  03/08/02   [name]: diverticulosis, internal hemorrhoids, adenomatous colon polyp  . COLONOSCOPY  01/23/04   [name]: diverticulosis, internal hemorrhoids  . COLONOSCOPY  09/25/05   [name]: diverticulosis  . COLONOSCOPY  07/05/09   [name]: severe diverticulosis  . COLONOSCOPY  01/21/11   [name]: severe diverticulosis in sigmoid to desc colon, int hemorrhoids, follow up TCS in 5 years  . COLONOSCOPY N/A 04/10/2016   Procedure: COLONOSCOPY;  Surgeon: Daneil Dolin, MD;  Location: [facility];  Service: Endoscopy;  Laterality: N/A;  12:30 PM  . DILATION AND CURETTAGE OF UTERUS    . ESOPHAGEAL MANOMETRY  03/21/08   Patterson: findings c/w [place] Esophagus  . ESOPHAGOGASTRODUODENOSCOPY  03/08/02   [name]: esophageal stricture, chronic gerd, s/p dilation.   . ESOPHAGOGASTRODUODENOSCOPY  01/23/04   [name]: esophageal stricture, gastritis, hiatal hernia  . ESOPHAGOGASTRODUODENOSCOPY  09/25/05   Patterson: gastritis, benign bx, no H.pylori  . ESOPHAGOGASTRODUODENOSCOPY  07/05/09   Patterson: gastropathy, benign small bowel bx and gastric bx  . ESOPHAGOGASTRODUODENOSCOPY N/A 05/15/2015   Dr. Gala Romney- diffuse moderate inflammation characterized by congestion (edema), erythema, and linear erosions was found in the entire examined stomach. bx= reactive gastropathy  . FOOT SURGERY Left   . HEMORRHOID BANDING  2017   Dr.Anona Giovannini  . LAPAROSCOPIC VAGINAL HYSTERECTOMY    . RECTOCELE REPAIR    . TOTAL KNEE ARTHROPLASTY Right     Prior to Admission medications   Medication Sig Start Date End Date Taking?  Authorizing Provider  acetaminophen (TYLENOL) 500 MG tablet Take 500 mg at bedtime by mouth.    Yes [provider]  aspirin EC 81 MG tablet Take 81 mg by mouth at bedtime.   Yes [provider]  azelastine (ASTELIN) 0.1 % nasal spray Place 1 spray into both nostrils 2 (two) times daily as needed for rhinitis. 03/07/17  Yes [name], MD  benzonatate  (TESSALON) 100 MG capsule TAKE 1 CAPSULE (100 MG TOTAL) BY MOUTH 3 (THREE) TIMES DAILY AS NEEDED FOR COUGH. 03/11/17  Yes [name], MD  Carboxymethylcellulose Sodium (THERATEARS) 0.25 % SOLN Apply 1 drop 2 (two) times daily to eye.   Yes [provider]  ezetimibe (ZETIA) 10 MG tablet TAKE 1 TABLET (10 MG TOTAL) BY MOUTH DAILY. 01/22/17  Yes [name], MD  guaiFENesin (MUCINEX) 600 MG 12 hr tablet Take 600 mg by mouth 2 (two) times daily as needed for cough or to loosen phlegm.    Yes [provider]  imipramine (TOFRANIL) 10 MG tablet Take 1 tablet (10 mg total) by mouth at bedtime. 09/30/16  Yes Mahala Menghini, PA-C  isometheptene-acetaminophen-dichloralphenazone (MIDRIN) [phone] MG capsule TAKE ONE CAPSULE BY MOUTH 4 TIMES A DAY AS NEEDED FOR HEADACHE 05/20/16  Yes [name], MD  losartan (COZAAR) 50 MG tablet Take 1 tablet (50 mg total) by mouth daily. Patient taking differently: Take 50 mg every evening by mouth.  11/11/16  Yes [name], MD  meclizine (ANTIVERT) 25 MG tablet TAKE 1 TABLET (25 MG TOTAL) BY MOUTH 3 (THREE) TIMES DAILY AS NEEDED FOR DIZZINESS OR NAUSEA. Patient taking differently: Take 25-50 mg 3 (three) times daily as needed by mouth for dizziness or nausea. TAKE 1 TABLET (25 MG TOTAL) BY MOUTH 3 (THREE) TIMES DAILY AS NEEDED FOR DIZZINESS OR NAUSEA. 07/24/16  Yes [name], MD  metFORMIN (GLUCOPHAGE) 500 MG tablet TAKE 2 TABLETS (1000 MG TOTAL) BY MOUTH 2 (TWO) TIMES DAILY. 04/11/17  Yes [name], MD  Meth-Hyo-M Barnett Hatter Phos-Ph Sal (URIBEL) 118 MG CAPS Take 118 mg 2 (two) times daily by mouth.    Yes [provider]  mirabegron ER (MYRBETRIQ) 25 MG TB24 tablet Take 50 mg every morning by mouth.    Yes [provider]  Multiple Vitamins-Minerals (PRESERVISION AREDS 2) CAPS Take 1 capsule by mouth 2 (two) times daily.    Yes [provider]  nateglinide (STARLIX) 120 MG tablet TAKE 1 TABLET BY  MOUTH 3 TIMES A DAY BEFORE MEALS 02/24/17  Yes [name], MD  omega-3 acid ethyl esters (LOVAZA) 1 g capsule Take 4 g by mouth daily.   Yes [provider]  omeprazole (PRILOSEC) 20 MG capsule TAKE ONE CAPSULE BY MOUTH EVERY MORNING BEFORE BREAKFAST 08/13/16  Yes Annitta Needs, NP  Pancrelipase, Lip-Prot-Amyl, 24000-76000 units CPEP Take 3 capsules three times daily with meals and one capsule with snack twice daily 01/27/17  Yes Carlis Stable, NP  vitamin E 400 UNIT capsule Take 400 Units by mouth 4 (four) times a week. Pt takes on Tuesday, Thursday, Saturday, and Sunday.   Yes [provider]  Vitamin Mixture (ESTER-C) 500-60 MG TABS Take 1 tablet by mouth 4 (four) times a week. Pt takes on Sunday, Monday, Wednesday, and Friday.   Yes [provider]  Wheat Dextrin (BENEFIBER DRINK MIX) PACK Take by mouth daily.   Yes [provider]  Wheat Dextrin (BENEFIBER PO) Take 1  packet by mouth daily.    Yes [provider]  amoxicillin (AMOXIL) 500 MG capsule Take 1 capsule (500 mg total) by mouth 3 (three) times daily. Patient not taking: Reported on 03/21/2017 03/07/17   [name], MD  cefPROZIL (CEFZIL) 500 MG tablet Take 1 tablet (500 mg total) by mouth 2 (two) times daily. Patient not taking: Reported on 04/18/2017 03/21/17   [name], MD  clidinium-chlordiazePOXIDE (LIBRAX) 5-2.5 MG capsule Take 1 capsule as needed by mouth (FOR GASTRIC ISSUES).     [provider]  ketoconazole (NIZORAL) 2 % cream Apply 1 application 2 (two) times daily topically. PRN rash Patient not taking: Reported on 04/18/2017 01/01/17   Nilda Simmer, NP  triamcinolone cream (KENALOG) 0.1 % Apply 1 application 2 (two) times daily topically. Prn rash; use up to 2 weeks Patient not taking: Reported on 04/18/2017 01/01/17   Nilda Simmer, NP    Allergies as of 04/18/2017 - Review Complete 04/18/2017  Allergen Reaction Noted  . Adhesive [tape] Other (See  Comments) 07/21/2016  . Estrogens Other (See Comments) 10/15/2012  . Sulfonamide derivatives Rash 03/01/2008    Family History  Problem Relation Age of Onset  . Ovarian cancer Mother   . Diabetes Father   . Heart disease Father   . Breast cancer Sister   . Liver cancer Sister   . Colon cancer Cousin        Paternal side  . Diabetes Maternal Aunt   . Liver disease Cousin        Maternal side, never drank    Social History   Socioeconomic History  . Marital status: Married    Spouse name: Not on file  . Number of children: 3  . Years of education: Not on file  . Highest education level: Not on file  Social Needs  . Financial resource strain: Not on file  . Food insecurity - worry: Not on file  . Food insecurity - inability: Not on file  . Transportation needs - medical: Not on file  . Transportation needs - non-medical: Not on file  Occupational History  . Occupation: Retired  Tobacco Use  . Smoking status: Never Smoker  . Smokeless tobacco: Never Used  . Tobacco comment: Never smoked  Substance and Sexual Activity  . Alcohol use: No    Alcohol/week: 0.0 oz  . Drug use: No  . Sexual activity: Not on file  Other Topics Concern  . Not on file  Social History Narrative  . Not on file    Review of Systems: See HPI, otherwise negative ROS  Physical Exam: BP 114/67   Pulse 89   Temp (!) 97 F (36.1 C) (Oral)   Ht 5\' 4"  (1.626 m)   Wt 161 lb 9.6 oz (73.3 kg)   BMI 27.74 kg/m  General:   Alert, somewhat frail pleasant and cooperative in NAD Neck:  Supple; no masses or thyromegaly. No significant cervical adenopathy. Lungs:  Clear throughout to auscultation.   No wheezes, crackles, or rhonchi. No acute distress. Heart:  Regular rate and rhythm; no murmurs, clicks, rubs,  or gallops. Abdomen: Non-distended, normal bowel sounds.  Soft and nontender without appreciable mass or hepatosplenomegaly.  Pulses:  Normal pulses noted. .  Impression:  Pleasant  84 year old lady with chronic pancreatitis and GERD  -  doing remarkably well from a GI standpoint these days. Weight down by 5 pounds but that was  Result of the conscious effort to facilitate  glycemic control. Currently, no alarm symptoms. Some recurrence and hemorrhoidal symptoms.  Banding help significantly a few years ago. May benefit from a repeat session.  Recommendations:  Continue omeprazole and pancreatic enzymes indefinitely. The benefits outweigh any theoretical risks. Continue half teaspoon of Benefiber daily  We'll plan to see the patient back in 9 months.  If patient desiresfurther banding in the interim she is to let us know and we will set that up.      Notice: This dictation was prepared with Dragon dictation along with smaller phrase technology. Any transcriptional errors that result from this process are unintentional and may not be corrected upon review.

## 2017-04-18 NOTE — Patient Instructions (Signed)
Continue omeprazole and pancreatic enzyme supplements  Follow-up with Dr. Wolfgang Phoenix regarding continued blood sugar control, etc.  Office visit in 9 months

## 2017-04-22 ENCOUNTER — Other Ambulatory Visit: Payer: Self-pay | Admitting: Family Medicine

## 2017-04-30 ENCOUNTER — Ambulatory Visit (INDEPENDENT_AMBULATORY_CARE_PROVIDER_SITE_OTHER): Payer: Medicare Other | Admitting: Urology

## 2017-04-30 DIAGNOSIS — N3020 Other chronic cystitis without hematuria: Secondary | ICD-10-CM

## 2017-04-30 DIAGNOSIS — N3281 Overactive bladder: Secondary | ICD-10-CM | POA: Diagnosis not present

## 2017-05-05 ENCOUNTER — Other Ambulatory Visit: Payer: Self-pay

## 2017-05-05 DIAGNOSIS — I83893 Varicose veins of bilateral lower extremities with other complications: Secondary | ICD-10-CM

## 2017-05-12 DIAGNOSIS — H353221 Exudative age-related macular degeneration, left eye, with active choroidal neovascularization: Secondary | ICD-10-CM | POA: Diagnosis not present

## 2017-05-12 DIAGNOSIS — H34832 Tributary (branch) retinal vein occlusion, left eye, with macular edema: Secondary | ICD-10-CM | POA: Diagnosis not present

## 2017-05-16 DIAGNOSIS — E119 Type 2 diabetes mellitus without complications: Secondary | ICD-10-CM | POA: Diagnosis not present

## 2017-05-17 ENCOUNTER — Other Ambulatory Visit: Payer: Self-pay | Admitting: Family Medicine

## 2017-05-28 ENCOUNTER — Ambulatory Visit (INDEPENDENT_AMBULATORY_CARE_PROVIDER_SITE_OTHER): Payer: Medicare Other | Admitting: Urology

## 2017-05-28 DIAGNOSIS — N3020 Other chronic cystitis without hematuria: Secondary | ICD-10-CM

## 2017-06-05 ENCOUNTER — Encounter: Payer: Self-pay | Admitting: *Deleted

## 2017-06-13 ENCOUNTER — Other Ambulatory Visit (HOSPITAL_COMMUNITY)
Admission: AD | Admit: 2017-06-13 | Discharge: 2017-06-13 | Disposition: A | Discharge status: Home / Self Care | Payer: Medicare Other | Source: Other Acute Inpatient Hospital | Attending: Urology | Admitting: Urology

## 2017-06-13 DIAGNOSIS — N3000 Acute cystitis without hematuria: Secondary | ICD-10-CM | POA: Insufficient documentation

## 2017-06-15 LAB — URINE CULTURE: Culture: <10000 — AB

## 2017-06-23 ENCOUNTER — Encounter: Payer: Self-pay | Admitting: Surgery

## 2017-06-23 ENCOUNTER — Telehealth: Payer: Self-pay | Admitting: Family Medicine

## 2017-06-23 ENCOUNTER — Ambulatory Visit (HOSPITAL_COMMUNITY)
Admission: RE | Admit: 2017-06-23 | Discharge: 2017-06-23 | Disposition: A | Discharge status: Home / Self Care | Payer: Medicare Other | Source: Ambulatory Visit | Attending: Surgery | Admitting: Surgery

## 2017-06-23 ENCOUNTER — Other Ambulatory Visit: Payer: Self-pay

## 2017-06-23 ENCOUNTER — Ambulatory Visit (INDEPENDENT_AMBULATORY_CARE_PROVIDER_SITE_OTHER): Payer: Medicare Other | Admitting: Surgery

## 2017-06-23 VITALS — BP 127/72 | HR 79 | Temp 97.0°F | Resp 16 | Ht 64.0 in | Wt 157.0 lb

## 2017-06-23 DIAGNOSIS — I83893 Varicose veins of bilateral lower extremities with other complications: Secondary | ICD-10-CM | POA: Insufficient documentation

## 2017-06-23 DIAGNOSIS — I872 Venous insufficiency (chronic) (peripheral): Secondary | ICD-10-CM

## 2017-06-23 NOTE — Telephone Encounter (Signed)
Patient requesting labs has appointment on 5/23

## 2017-06-23 NOTE — Progress Notes (Signed)
[facility]  Patient name: Deborah Gray MRN: [id number] DOB: 12-07-1933 Sex: female   REQUESTING PROVIDER:    Dr. Wolfgang Phoenix    REASON FOR CONSULT:    Varicose veins  HISTORY OF PRESENT ILLNESS:   Deborah Gray is a 84 y.o. female, who is here today for evaluation of her varicose veins.  She reports having had sclerotherapy in the past.  She has recently noted some pain behind her left knee especially when sitting in her metal folding chair watching grandchildren's athletic competitions.  She does wear her compression stockings except on Sundays.  She has bilateral leg swelling.  She has not had any bleeding episodes.  Patient is a type II diabetic, which is well controlled.  She takes an ARB for hypertension.  She is a non-smoker.  PAST MEDICAL HISTORY    Past Medical History:  Diagnosis Date  . Arthritis   . Benign neoplasm of colon   . Constipation   . Diaphragmatic hernia without mention of obstruction or gangrene   . Diverticulosis of colon (without mention of hemorrhage)   . Dysphagia, pharyngoesophageal phase   . Esophageal reflux   . Essential hypertension   . Heart murmur   . Hemorrhoids   . Hyperlipidemia   . Internal hemorrhoids without mention of complication   . PONV (postoperative nausea and vomiting)   . Stricture and stenosis of esophagus   . Type 2 diabetes mellitus ([place])   . Unspecified gastritis and gastroduodenitis without mention of hemorrhage      FAMILY HISTORY   Family History  Problem Relation Age of Onset  . Ovarian cancer Mother   . Diabetes Father   . Heart disease Father   . Breast cancer Sister   . Liver cancer Sister   . Colon cancer Cousin        Paternal side  . Diabetes Maternal Aunt   . Liver disease Cousin        Maternal side, never drank    SOCIAL HISTORY:   Social History   Socioeconomic History  . Marital status: Married    Spouse name: Not  on file  . Number of children: 3  . Years of education: Not on file  . Highest education level: Not on file  Occupational History  . Occupation: Retired  Scientific laboratory technician  . Financial resource strain: Not on file  . Food insecurity:    Worry: Not on file    Inability: Not on file  . Transportation needs:    Medical: Not on file    Non-medical: Not on file  Tobacco Use  . Smoking status: Never Smoker  . Smokeless tobacco: Never Used  . Tobacco comment: Never smoked  Substance and Sexual Activity  . Alcohol use: No    Alcohol/week: 0.0 oz  . Drug use: No  . Sexual activity: Not on file  Lifestyle  . Physical activity:    Days per week: Not on file    Minutes per session: Not on file  . Stress: Not on file  Relationships  . Social connections:    Talks on phone: Not on file    Gets together: Not on file    Attends religious service: Not on file    Active member of club or organization: Not on file    Attends meetings of clubs or organizations: Not on file    Relationship status: Not on file  . Intimate partner violence:  Fear of current or ex partner: Not on file    Emotionally abused: Not on file    Physically abused: Not on file    Forced sexual activity: Not on file  Other Topics Concern  . Not on file  Social History Narrative  . Not on file    ALLERGIES:    Allergies  Allergen Reactions  . Adhesive [Tape] Other (See Comments)    Reaction:  Blisters   . Estrogens Other (See Comments)    Reaction:  Migraines   . Sulfonamide Derivatives Rash    CURRENT MEDICATIONS:    Current Outpatient Medications  Medication Sig Dispense Refill  . acetaminophen (TYLENOL) 500 MG tablet Take 500 mg at bedtime by mouth.     Marland Kitchen aspirin EC 81 MG tablet Take 81 mg by mouth at bedtime.    Marland Kitchen azelastine (ASTELIN) 0.1 % nasal spray Place 1 spray into both nostrils 2 (two) times daily as needed for rhinitis. 30 mL 11  . Carboxymethylcellulose Sodium (THERATEARS) 0.25 % SOLN Apply  1 drop 2 (two) times daily to eye.    . clidinium-chlordiazePOXIDE (LIBRAX) 5-2.5 MG capsule Take 1 capsule as needed by mouth (FOR GASTRIC ISSUES).     . ezetimibe (ZETIA) 10 MG tablet TAKE 1 TABLET (10 MG TOTAL) BY MOUTH DAILY. 90 tablet 1  . imipramine (TOFRANIL) 10 MG tablet Take 1 tablet (10 mg total) by mouth at bedtime. 90 tablet 3  . isometheptene-acetaminophen-dichloralphenazone (MIDRIN) 65-100-325 MG capsule TAKE ONE CAPSULE BY MOUTH 4 TIMES A DAY AS NEEDED FOR HEADACHE 30 capsule 2  . ketoconazole (NIZORAL) 2 % cream Apply 1 application 2 (two) times daily topically. PRN rash 30 g 0  . losartan (COZAAR) 50 MG tablet Take 1 tablet (50 mg total) by mouth every evening. 90 tablet 1  . meclizine (ANTIVERT) 25 MG tablet TAKE 1 TABLET (25 MG TOTAL) BY MOUTH 3 (THREE) TIMES DAILY AS NEEDED FOR DIZZINESS OR NAUSEA. (Patient taking differently: Take 25-50 mg 3 (three) times daily as needed by mouth for dizziness or nausea. TAKE 1 TABLET (25 MG TOTAL) BY MOUTH 3 (THREE) TIMES DAILY AS NEEDED FOR DIZZINESS OR NAUSEA.) 30 tablet 6  . metFORMIN (GLUCOPHAGE) 500 MG tablet TAKE 1 TABLET BY MOUTH TWICE A DAY 180 tablet 0  . mirabegron ER (MYRBETRIQ) 25 MG TB24 tablet Take 50 mg every morning by mouth.     . Multiple Vitamins-Minerals (PRESERVISION AREDS 2) CAPS Take 1 capsule by mouth 2 (two) times daily.     . nateglinide (STARLIX) 120 MG tablet TAKE 1 TABLET BY MOUTH 3 TIMES A DAY BEFORE MEALS 270 tablet 1  . omega-3 acid ethyl esters (LOVAZA) 1 g capsule Take 4 g by mouth daily.    Marland Kitchen omeprazole (PRILOSEC) 20 MG capsule TAKE ONE CAPSULE BY MOUTH EVERY MORNING BEFORE BREAKFAST 90 capsule 3  . Pancrelipase, Lip-Prot-Amyl, 24000-76000 units CPEP Take 3 capsules three times daily with meals and one capsule with snack twice daily 990 capsule 2  . triamcinolone cream (KENALOG) 0.1 % Apply 1 application 2 (two) times daily topically. Prn rash; use up to 2 weeks 30 g 0  . vitamin E 400 UNIT capsule Take 400  Units by mouth 4 (four) times a week. Pt takes on Tuesday, Thursday, Saturday, and Sunday.    . Vitamin Mixture (ESTER-C) 500-60 MG TABS Take 1 tablet by mouth 4 (four) times a week. Pt takes on Sunday, Monday, Wednesday, and Friday.    . Wheat Dextrin (  BENEFIBER DRINK MIX) PACK Take by mouth daily.    Marland Kitchen guaiFENesin (MUCINEX) 600 MG 12 hr tablet Take 600 mg by mouth 2 (two) times daily as needed for cough or to loosen phlegm.      No current facility-administered medications for this visit.     REVIEW OF SYSTEMS:   [X]  denotes positive finding, [ ]  denotes negative finding Cardiac  Comments:  Chest pain or chest pressure:    Shortness of breath upon exertion:    Short of breath when lying flat:    Irregular heart rhythm:        Vascular    Pain in calf, thigh, or hip brought on by ambulation: x   Pain in feet at night that wakes you up from your sleep:     Blood clot in your veins:    Leg swelling:  x       Pulmonary    Oxygen at home:    Productive cough:     Wheezing:         Neurologic    Sudden weakness in arms or legs:     Sudden numbness in arms or legs:     Sudden onset of difficulty speaking or slurred speech:    Temporary loss of vision in one eye:     Problems with dizziness:         Gastrointestinal    Blood in stool:      Vomited blood:         Genitourinary    Burning when urinating:     Blood in urine:        Psychiatric    Major depression:         Hematologic    Bleeding problems:    Problems with blood clotting too easily:        Skin    Rashes or ulcers:        Constitutional    Fever or chills:     PHYSICAL EXAM:   Vitals:   06/23/17 1220  BP: 127/72  Pulse: 79  Resp: 16  Temp: (!) 97 F (36.1 C)  TempSrc: Oral  SpO2: 100%  Weight: 157 lb (71.2 kg)  Height: 5\' 4"  (1.626 m)    GENERAL: The patient is a well-nourished female, in no acute distress. The vital signs are documented above. CARDIAC: There is a regular rate and  rhythm.  VASCULAR: Multiple spider veins in the leg with pitting edema PULMONARY: Nonlabored respirations MUSCULOSKELETAL: There are no major deformities or cyanosis. NEUROLOGIC: No focal weakness or paresthesias are detected. SKIN: There are no ulcers or rashes noted. PSYCHIATRIC: The patient has a normal affect.  STUDIES:   I have ordered and reviewed her venous reflux study.  This was positive for reflux in the right common femoral, great saphenous vein at the groin and proximal calf and small saphenous vein at the saphenous popliteal junction.  The left leg was positive for reflux in the common femoral vein and the small saphenous at the level of saphenous popliteal junction  ASSESSMENT and PLAN   Chronic venous insufficiency: The patient's biggest concern is that of pain behind her left knee.  I have reassured her that this is not related to her venous incompetence.  Based on her ultrasound, I would not recommend any invasive treatment at this time as I do not feel she would get significant benefit from laser ablation of her saphenous vein.  Currently her surface veins do not appear  to be bothering her and she has not had any evidence of bleeding.  Therefore I would continue with observation.  She knows to contact me should she develop any issues in the future.   Annamarie Major, MD [facility] [phone] Pager [phone]

## 2017-06-24 ENCOUNTER — Other Ambulatory Visit: Payer: Self-pay | Admitting: Family Medicine

## 2017-06-24 DIAGNOSIS — Z0000 Encounter for general adult medical examination without abnormal findings: Secondary | ICD-10-CM

## 2017-06-24 DIAGNOSIS — E782 Mixed hyperlipidemia: Secondary | ICD-10-CM

## 2017-06-24 DIAGNOSIS — I10 Essential (primary) hypertension: Secondary | ICD-10-CM

## 2017-06-24 DIAGNOSIS — E1165 Type 2 diabetes mellitus with hyperglycemia: Secondary | ICD-10-CM

## 2017-06-24 NOTE — Telephone Encounter (Signed)
Rep same plus urine protin

## 2017-06-24 NOTE — Telephone Encounter (Signed)
Labs placed and pt notified

## 2017-06-25 DIAGNOSIS — E782 Mixed hyperlipidemia: Secondary | ICD-10-CM | POA: Diagnosis not present

## 2017-06-25 DIAGNOSIS — I10 Essential (primary) hypertension: Secondary | ICD-10-CM | POA: Diagnosis not present

## 2017-06-25 DIAGNOSIS — E1165 Type 2 diabetes mellitus with hyperglycemia: Secondary | ICD-10-CM | POA: Diagnosis not present

## 2017-06-25 DIAGNOSIS — Z0000 Encounter for general adult medical examination without abnormal findings: Secondary | ICD-10-CM | POA: Diagnosis not present

## 2017-06-26 LAB — HEPATIC FUNCTION PANEL
ALBUMIN: 4.7 g/dL (ref 3.5–4.7)
ALT: 13 IU/L (ref 0–32)
AST: 13 IU/L (ref 0–40)
Alkaline Phosphatase: 75 IU/L (ref 39–117)
BILIRUBIN TOTAL: 0.5 mg/dL (ref 0.0–1.2)
Bilirubin, Direct: 0.11 mg/dL (ref 0.00–0.40)
TOTAL PROTEIN: 7.1 g/dL (ref 6.0–8.5)

## 2017-06-26 LAB — BASIC METABOLIC PANEL
BUN/Creatinine Ratio: 32 — ABNORMAL HIGH (ref 12–28)
BUN: 23 mg/dL (ref 8–27)
CO2: 23 mmol/L (ref 20–29)
CREATININE: 0.73 mg/dL (ref 0.57–1.00)
Calcium: 10.3 mg/dL (ref 8.7–10.3)
Chloride: 103 mmol/L (ref 96–106)
GFR calc non Af Amer: 77 mL/min/{1.73_m2} (ref >59)
GFR, EST AFRICAN AMERICAN: 89 mL/min/{1.73_m2} (ref >59)
Glucose: 150 mg/dL — ABNORMAL HIGH (ref 65–99)
POTASSIUM: 5.1 mmol/L (ref 3.5–5.2)
SODIUM: 144 mmol/L (ref 134–144)

## 2017-06-26 LAB — LIPID PANEL
CHOL/HDL RATIO: 3.6 ratio (ref 0.0–4.4)
Cholesterol, Total: 200 mg/dL — ABNORMAL HIGH (ref 100–199)
HDL: 55 mg/dL (ref >39)
LDL Calculated: 120 mg/dL — ABNORMAL HIGH (ref 0–99)
TRIGLYCERIDES: 124 mg/dL (ref 0–149)
VLDL CHOLESTEROL CAL: 25 mg/dL (ref 5–40)

## 2017-06-26 LAB — MICROALBUMIN / CREATININE URINE RATIO
CREATININE, UR: 68.6 mg/dL
MICROALB/CREAT RATIO: 14 mg/g{creat} (ref 0.0–30.0)
MICROALBUM., U, RANDOM: 9.6 ug/mL

## 2017-06-26 LAB — HEMOGLOBIN A1C
Est. average glucose Bld gHb Est-mCnc: 148 mg/dL
HEMOGLOBIN A1C: 6.8 % — AB (ref 4.8–5.6)

## 2017-07-02 ENCOUNTER — Ambulatory Visit (INDEPENDENT_AMBULATORY_CARE_PROVIDER_SITE_OTHER): Payer: Medicare Other | Admitting: Urology

## 2017-07-02 DIAGNOSIS — N3020 Other chronic cystitis without hematuria: Secondary | ICD-10-CM | POA: Diagnosis not present

## 2017-07-03 ENCOUNTER — Ambulatory Visit: Payer: Medicare Other | Admitting: Family Medicine

## 2017-07-03 ENCOUNTER — Other Ambulatory Visit: Payer: Self-pay | Admitting: Family Medicine

## 2017-07-03 DIAGNOSIS — E119 Type 2 diabetes mellitus without complications: Secondary | ICD-10-CM | POA: Diagnosis not present

## 2017-07-03 NOTE — Telephone Encounter (Signed)
Left message to return call to clarify how pt is taking med. Does not match whats in epic

## 2017-07-08 DIAGNOSIS — H34832 Tributary (branch) retinal vein occlusion, left eye, with macular edema: Secondary | ICD-10-CM | POA: Diagnosis not present

## 2017-07-09 NOTE — Telephone Encounter (Signed)
Contacted pt and she stated that at her last visit, provider increase the med to 2 in the morning and 2 in the evening. Pt states that sometimes she takes 1.5 tablets in morning and 1.5 in evening. She states it depends on activity and what she eats. Pt does have appt tomorrow. Please advise.

## 2017-07-09 NOTE — Telephone Encounter (Signed)
Will disc tomorro

## 2017-07-10 ENCOUNTER — Ambulatory Visit (INDEPENDENT_AMBULATORY_CARE_PROVIDER_SITE_OTHER): Payer: Medicare Other | Admitting: Family Medicine

## 2017-07-10 ENCOUNTER — Encounter: Payer: Self-pay | Admitting: Family Medicine

## 2017-07-10 VITALS — BP 126/84 | Ht 64.0 in | Wt 158.4 lb

## 2017-07-10 DIAGNOSIS — E1165 Type 2 diabetes mellitus with hyperglycemia: Secondary | ICD-10-CM

## 2017-07-10 DIAGNOSIS — I10 Essential (primary) hypertension: Secondary | ICD-10-CM | POA: Diagnosis not present

## 2017-07-10 DIAGNOSIS — E782 Mixed hyperlipidemia: Secondary | ICD-10-CM | POA: Diagnosis not present

## 2017-07-10 DIAGNOSIS — Z79899 Other long term (current) drug therapy: Secondary | ICD-10-CM | POA: Diagnosis not present

## 2017-07-10 DIAGNOSIS — R5383 Other fatigue: Secondary | ICD-10-CM | POA: Diagnosis not present

## 2017-07-10 MED ORDER — METFORMIN HCL 500 MG PO TABS: ORAL_TABLET | ORAL | 1 refills | Status: DC

## 2017-07-10 MED ORDER — EZETIMIBE 10 MG PO TABS: ORAL_TABLET | ORAL | 1 refills | Status: DC

## 2017-07-10 MED ORDER — NATEGLINIDE 120 MG PO TABS: ORAL_TABLET | ORAL | 1 refills | Status: DC

## 2017-07-10 MED ORDER — LOSARTAN POTASSIUM 50 MG PO TABS: 50.0000 mg | ORAL_TABLET | Freq: Every evening | ORAL | 1 refills | Status: DC

## 2017-07-10 NOTE — Progress Notes (Signed)
Subjective:    Patient ID: Deborah Gray, female    DOB: February 14, 1934, 84 y.o.   MRN: [id number]  Diabetes  She presents for her follow-up diabetic visit. She has type 2 diabetes mellitus. Risk factors for coronary artery disease include diabetes mellitus, hypertension and post-menopausal. Current diabetic treatment includes oral agent (dual therapy). She is compliant with treatment all of the time. Her weight is stable. She is following a diabetic diet. She has not had a previous visit with a dietitian. She does not see a podiatrist.Eye exam is current (has eye injections).   Patient claims compliance with diabetes medication. No obvious side effects. Reports no substantial low sugar spells. Most numbers are generally in good range when checked fasting. Generally does not miss a dose of medication. Watching diabetic diet closely  Blood pressure medicine and blood pressure levels reviewed today with patient. Compliant with blood pressure medicine. States does not miss a dose. No obvious side effects. Blood pressure generally good when checked elsewhere. Watching salt intake.   Patient continues to take lipid medication regularly. No obvious side effects from it. Generally does not miss a dose. Prior blood work results are reviewed with patient. Patient continues to work on fat intake in diet  Results for orders placed or performed in visit on 06/24/17  Urine Microalbumin w/creat. ratio  Result Value Ref Range   Creatinine, Urine 68.6 Not Estab. mg/dL   Microalbumin, Urine 9.6 Not Estab. ug/mL   Microalb/Creat Ratio 14.0 0.0 - 30.0 mg/g creat  Hepatic function panel  Result Value Ref Range   Total Protein 7.1 6.0 - 8.5 g/dL   Albumin 4.7 3.5 - 4.7 g/dL   Bilirubin Total 0.5 0.0 - 1.2 mg/dL   Bilirubin, Direct 0.11 0.00 - 0.40 mg/dL   Alkaline Phosphatase 75 39 - 117 IU/L   AST 13 0 - 40 IU/L   ALT 13 0 - 32 IU/L  Lipid Profile  Result Value Ref Range   Cholesterol, Total 200 (H) 100  - 199 mg/dL   Triglycerides 124 0 - 149 mg/dL   HDL 55 >39 mg/dL   VLDL Cholesterol Cal 25 5 - 40 mg/dL   LDL Calculated 120 (H) 0 - 99 mg/dL   Chol/HDL Ratio 3.6 0.0 - 4.4 ratio  HgB A1c  Result Value Ref Range   Hgb A1c MFr Bld 6.8 (H) 4.8 - 5.6 %   Est. average glucose Bld gHb Est-mCnc 148 mg/dL  Basic Metabolic Panel (BMET)  Result Value Ref Range   Glucose 150 (H) 65 - 99 mg/dL   BUN 23 8 - 27 mg/dL   Creatinine, Ser 0.73 0.57 - 1.00 mg/dL   GFR calc non Af Amer 77 >59 mL/min/1.73   GFR calc Af Amer 89 >59 mL/min/1.73   BUN/Creatinine Ratio 32 (H) 12 - 28   Sodium 144 134 - 144 mmol/L   Potassium 5.1 3.5 - 5.2 mmol/L   Chloride 103 96 - 106 mmol/L   CO2 23 20 - 29 mmol/L   Calcium 10.3 8.7 - 10.3 mg/dL       Review of Systems No headache, no major weight loss or weight gain, no chest pain no back pain abdominal pain no change in bowel habits complete ROS otherwise negative     Objective:   Physical Exam  Alert and oriented, vitals reviewed and stable, NAD ENT-TM's and ext canals WNL bilat via otoscopic exam Soft palate, tonsils and post pharynx WNL via oropharyngeal exam Neck-symmetric, no  masses; thyroid nonpalpable and nontender Pulmonary-no tachypnea or accessory muscle use; Clear without wheezes via auscultation Card--no abnrml murmurs, rhythm reg and rate WNL Carotid pulses symmetric, without bruits       Assessment & Plan:  Diabetes, decent control, morn num low 100s, A1c good, cst. Pt backed of to one and a half bid and hS MOSTLY STUck with   Htn, good control , compl with meds.not missing a dose. Sticking with current dose  Hyperlipidemia, good control historically, complian with meds, does not miss a dose    Will add tsh and cbc

## 2017-07-11 LAB — CBC WITH DIFFERENTIAL/PLATELET
BASOS ABS: 0 10*3/uL (ref 0.0–0.2)
BASOS: 0 %
EOS (ABSOLUTE): 0.2 10*3/uL (ref 0.0–0.4)
Eos: 2 %
Hematocrit: 44.1 % (ref 34.0–46.6)
Hemoglobin: 14.4 g/dL (ref 11.1–15.9)
IMMATURE GRANULOCYTES: 0 %
Immature Grans (Abs): 0 10*3/uL (ref 0.0–0.1)
Lymphocytes Absolute: 2.9 10*3/uL (ref 0.7–3.1)
Lymphs: 30 %
MCH: 29.1 pg (ref 26.6–33.0)
MCHC: 32.7 g/dL (ref 31.5–35.7)
MCV: 89 fL (ref 79–97)
MONOS ABS: 0.8 10*3/uL (ref 0.1–0.9)
Monocytes: 9 %
NEUTROS ABS: 5.8 10*3/uL (ref 1.4–7.0)
NEUTROS PCT: 59 %
PLATELETS: 283 10*3/uL (ref 150–450)
RBC: 4.94 x10E6/uL (ref 3.77–5.28)
RDW: 14.9 % (ref 12.3–15.4)
WBC: 9.7 10*3/uL (ref 3.4–10.8)

## 2017-07-11 LAB — TSH: TSH: 2.89 u[IU]/mL (ref 0.450–4.500)

## 2017-07-13 ENCOUNTER — Encounter: Payer: Self-pay | Admitting: Family Medicine

## 2017-07-15 ENCOUNTER — Telehealth: Payer: Self-pay | Admitting: Family Medicine

## 2017-07-15 NOTE — Telephone Encounter (Signed)
Left message to return call 

## 2017-07-15 NOTE — Telephone Encounter (Signed)
Thyroid perfect I dict letter two d ago, make sure erica sends soon

## 2017-07-15 NOTE — Telephone Encounter (Signed)
Patient notified that her thyroid blood work was perfect. Patient verbalized understanding.

## 2017-07-15 NOTE — Telephone Encounter (Signed)
Patient is calling to check the results of her recent thyroid testing.  She said she has been feeling really weak today.

## 2017-07-22 ENCOUNTER — Other Ambulatory Visit: Payer: Self-pay | Admitting: Family Medicine

## 2017-07-30 ENCOUNTER — Ambulatory Visit (INDEPENDENT_AMBULATORY_CARE_PROVIDER_SITE_OTHER): Payer: Medicare Other | Admitting: Urology

## 2017-07-30 ENCOUNTER — Ambulatory Visit: Payer: Medicare Other | Admitting: Urology

## 2017-07-30 DIAGNOSIS — N3020 Other chronic cystitis without hematuria: Secondary | ICD-10-CM | POA: Diagnosis not present

## 2017-08-02 DIAGNOSIS — E119 Type 2 diabetes mellitus without complications: Secondary | ICD-10-CM | POA: Diagnosis not present

## 2017-08-07 DIAGNOSIS — M545 Low back pain: Secondary | ICD-10-CM | POA: Diagnosis not present

## 2017-08-13 ENCOUNTER — Telehealth: Payer: Self-pay

## 2017-08-13 NOTE — Telephone Encounter (Signed)
Ew RX request received from [facility] [place], [place] [id number] for Chlordiazepoxide-clidinium #90, take 1 capsule po tid as needed.

## 2017-08-15 MED ORDER — CILIDINIUM-CHLORDIAZEPOXIDE 2.5-5 MG PO CAPS: 1.0000 | ORAL_CAPSULE | Freq: Three times a day (TID) | ORAL | 0 refills | Status: DC | PRN

## 2017-08-15 NOTE — Addendum Note (Signed)
Addended by: Mahala Menghini on: 08/15/2017 08:52 AM   Modules accepted: Orders

## 2017-08-18 ENCOUNTER — Other Ambulatory Visit: Payer: Self-pay | Admitting: *Deleted

## 2017-08-22 NOTE — Telephone Encounter (Signed)
Noted  

## 2017-08-22 NOTE — Telephone Encounter (Signed)
Deborah Gray has already sent this in on 6/28.

## 2017-08-25 DIAGNOSIS — H34832 Tributary (branch) retinal vein occlusion, left eye, with macular edema: Secondary | ICD-10-CM | POA: Diagnosis not present

## 2017-08-27 ENCOUNTER — Ambulatory Visit (INDEPENDENT_AMBULATORY_CARE_PROVIDER_SITE_OTHER): Payer: Medicare Other | Admitting: Urology

## 2017-08-27 DIAGNOSIS — N3020 Other chronic cystitis without hematuria: Secondary | ICD-10-CM | POA: Diagnosis not present

## 2017-09-01 ENCOUNTER — Telehealth: Payer: Self-pay | Admitting: Family Medicine

## 2017-09-01 MED ORDER — METFORMIN HCL 500 MG PO TABS: 500.0000 mg | ORAL_TABLET | Freq: Two times a day (BID) | ORAL | 1 refills | Status: DC

## 2017-09-01 NOTE — Telephone Encounter (Signed)
Refill sent in and patient notified

## 2017-09-01 NOTE — Telephone Encounter (Signed)
Patient called because she lost her medicine.  She picked it up from the pharmacy but doesn't remember what happened to it after that.  May have thrown it away. It was her Merformin. Last rx was 07-22-17 to be taken 1 twice a day, but she said at her last visit she was told to take 3 a day, 1 in the morning and 2 at night.  Patient needs new rx sent to [facility].  I warned patient that insurance may not pay for another refill.

## 2017-09-01 NOTE — Telephone Encounter (Signed)
Ok may fill

## 2017-09-02 ENCOUNTER — Ambulatory Visit (INDEPENDENT_AMBULATORY_CARE_PROVIDER_SITE_OTHER): Payer: Medicare Other | Admitting: Internal Medicine

## 2017-09-02 ENCOUNTER — Encounter: Payer: Self-pay | Admitting: Internal Medicine

## 2017-09-02 VITALS — BP 118/67 | HR 91 | Temp 97.2°F | Ht 64.0 in | Wt 159.4 lb

## 2017-09-02 DIAGNOSIS — K648 Other hemorrhoids: Secondary | ICD-10-CM

## 2017-09-02 NOTE — Progress Notes (Signed)
Shevlin banding procedure note:  The patient presents with symptomatic grade 2/3 hemorrhoids presents after 2 year absence of hemorrhoids symptoms with recurrent burning itching and protrusion. An excellent response getting all 3 columns banded over 2 years ago. She is requesting a repeat session today. She felt previously was very helpful. She is taking Benefiber daily and avoid straining as much as possible. requesting rubber band ligation of his/her hemorrhoidal disease.  All risks, benefits, and alternative forms of therapy were described and informed consent was obtained.  In the left lateral decubitus position, I DRE revealed no abnormalities.  The decision was made to band each hemorrhoid column once again;  the [place] was used to perform band ligation without complication. Digital anorectal examination was then performed after each BM place. Bands found to be in excellent position. No pinching or pain. The patient was discharged home without pain or other issues. Dietary and behavioral recommendations were given. The patient will return in 8 weeks for follow-up.  No complications were encountered and the patient tolerated the procedure well.  Patient states generic Librax helped tremendously with occasional episodes of esophageal spasm. She uses medication sparingly with the past couple of years. Refill at pharmacy was cost prohibitive. She has Levsin SL on hand  At home;   I recommended she simply try one of these sublingually should she have an attack before meals at bedtime when necessary.

## 2017-09-02 NOTE — Patient Instructions (Signed)
Avoid straining.  Continue Benefiber daily  Limit toilet time to 5 minutes  Call with any interim problems  May use Levsin sublingually when necessary for esophageal spasm  Schedule followup appointment in 2 months from now

## 2017-09-03 ENCOUNTER — Telehealth: Payer: Self-pay

## 2017-09-03 DIAGNOSIS — M545 Low back pain: Secondary | ICD-10-CM | POA: Diagnosis not present

## 2017-09-03 DIAGNOSIS — M5417 Radiculopathy, lumbosacral region: Secondary | ICD-10-CM | POA: Diagnosis not present

## 2017-09-03 NOTE — Telephone Encounter (Signed)
Noted. Would use topical agents she has on hand liberally. Pain will diminish over the next couple days

## 2017-09-03 NOTE — Telephone Encounter (Signed)
Pt called office. She has been having pain since hemorrhoid banding was done yesterday. Rates pain as 4-5 and constant. No bleeding. She thinks one of the bandings didn't hold but it hasn't came out. She has Nupercainal cream she can use.

## 2017-09-03 NOTE — Telephone Encounter (Signed)
Tried to call pt, no answer at home number. Called mobile number, her husband will have her call office.

## 2017-09-04 NOTE — Telephone Encounter (Signed)
Pt called office. Informed of RMR's recommendation. She is feeling much better now.

## 2017-09-04 NOTE — Telephone Encounter (Signed)
Thanks

## 2017-09-05 DIAGNOSIS — M2040 Other hammer toe(s) (acquired), unspecified foot: Secondary | ICD-10-CM | POA: Diagnosis not present

## 2017-09-05 DIAGNOSIS — M76821 Posterior tibial tendinitis, right leg: Secondary | ICD-10-CM | POA: Diagnosis not present

## 2017-09-05 DIAGNOSIS — E1142 Type 2 diabetes mellitus with diabetic polyneuropathy: Secondary | ICD-10-CM | POA: Diagnosis not present

## 2017-09-05 DIAGNOSIS — M76822 Posterior tibial tendinitis, left leg: Secondary | ICD-10-CM | POA: Diagnosis not present

## 2017-09-16 DIAGNOSIS — E119 Type 2 diabetes mellitus without complications: Secondary | ICD-10-CM | POA: Diagnosis not present

## 2017-09-24 ENCOUNTER — Other Ambulatory Visit (HOSPITAL_COMMUNITY)
Admission: AD | Admit: 2017-09-24 | Discharge: 2017-09-24 | Disposition: A | Discharge status: Home / Self Care | Payer: Medicare Other | Source: Skilled Nursing Facility | Attending: Urology | Admitting: Urology

## 2017-09-24 ENCOUNTER — Ambulatory Visit (INDEPENDENT_AMBULATORY_CARE_PROVIDER_SITE_OTHER): Payer: Medicare Other | Admitting: Urology

## 2017-09-24 DIAGNOSIS — N3020 Other chronic cystitis without hematuria: Secondary | ICD-10-CM | POA: Diagnosis not present

## 2017-09-24 DIAGNOSIS — N3010 Interstitial cystitis (chronic) without hematuria: Secondary | ICD-10-CM | POA: Insufficient documentation

## 2017-09-26 ENCOUNTER — Ambulatory Visit: Payer: Medicare Other | Admitting: Urology

## 2017-09-26 ENCOUNTER — Other Ambulatory Visit: Payer: Self-pay | Admitting: Gastroenterology

## 2017-09-26 LAB — URINE CULTURE

## 2017-10-08 ENCOUNTER — Ambulatory Visit (INDEPENDENT_AMBULATORY_CARE_PROVIDER_SITE_OTHER): Payer: Medicare Other | Admitting: Urology

## 2017-10-08 DIAGNOSIS — N3020 Other chronic cystitis without hematuria: Secondary | ICD-10-CM | POA: Diagnosis not present

## 2017-10-08 DIAGNOSIS — M545 Low back pain, unspecified: Secondary | ICD-10-CM | POA: Insufficient documentation

## 2017-10-09 DIAGNOSIS — M545 Low back pain: Secondary | ICD-10-CM | POA: Diagnosis not present

## 2017-10-13 DIAGNOSIS — H34832 Tributary (branch) retinal vein occlusion, left eye, with macular edema: Secondary | ICD-10-CM | POA: Diagnosis not present

## 2017-10-24 ENCOUNTER — Other Ambulatory Visit: Payer: Self-pay | Admitting: Nurse Practitioner

## 2017-10-24 DIAGNOSIS — K861 Other chronic pancreatitis: Secondary | ICD-10-CM

## 2017-10-29 ENCOUNTER — Ambulatory Visit: Payer: Medicare Other | Admitting: Urology

## 2017-10-30 DIAGNOSIS — M5416 Radiculopathy, lumbar region: Secondary | ICD-10-CM | POA: Diagnosis not present

## 2017-10-30 DIAGNOSIS — M5136 Other intervertebral disc degeneration, lumbar region: Secondary | ICD-10-CM | POA: Diagnosis not present

## 2017-10-30 DIAGNOSIS — M545 Low back pain: Secondary | ICD-10-CM | POA: Diagnosis not present

## 2017-10-30 DIAGNOSIS — E119 Type 2 diabetes mellitus without complications: Secondary | ICD-10-CM | POA: Diagnosis not present

## 2017-11-03 DIAGNOSIS — E119 Type 2 diabetes mellitus without complications: Secondary | ICD-10-CM | POA: Diagnosis not present

## 2017-11-05 ENCOUNTER — Ambulatory Visit (INDEPENDENT_AMBULATORY_CARE_PROVIDER_SITE_OTHER): Payer: Medicare Other | Admitting: Urology

## 2017-11-05 DIAGNOSIS — N3020 Other chronic cystitis without hematuria: Secondary | ICD-10-CM | POA: Diagnosis not present

## 2017-11-05 DIAGNOSIS — N3281 Overactive bladder: Secondary | ICD-10-CM

## 2017-11-06 ENCOUNTER — Other Ambulatory Visit: Payer: Self-pay | Admitting: Family Medicine

## 2017-11-07 ENCOUNTER — Encounter (HOSPITAL_COMMUNITY): Payer: Self-pay | Admitting: Emergency Medicine

## 2017-11-07 ENCOUNTER — Emergency Department (HOSPITAL_COMMUNITY): Payer: Medicare Other

## 2017-11-07 ENCOUNTER — Other Ambulatory Visit: Payer: Self-pay

## 2017-11-07 ENCOUNTER — Emergency Department (HOSPITAL_COMMUNITY)
Admission: EM | Admit: 2017-11-07 | Discharge: 2017-11-07 | Disposition: A | Discharge status: Home / Self Care | Payer: Medicare Other | Attending: Emergency Medicine | Admitting: Emergency Medicine

## 2017-11-07 DIAGNOSIS — E119 Type 2 diabetes mellitus without complications: Secondary | ICD-10-CM | POA: Insufficient documentation

## 2017-11-07 DIAGNOSIS — I10 Essential (primary) hypertension: Secondary | ICD-10-CM | POA: Insufficient documentation

## 2017-11-07 DIAGNOSIS — R002 Palpitations: Secondary | ICD-10-CM

## 2017-11-07 DIAGNOSIS — I471 Supraventricular tachycardia: Secondary | ICD-10-CM | POA: Insufficient documentation

## 2017-11-07 DIAGNOSIS — R000 Tachycardia, unspecified: Secondary | ICD-10-CM | POA: Diagnosis not present

## 2017-11-07 LAB — MAGNESIUM: Magnesium: 2 mg/dL (ref 1.7–2.4)

## 2017-11-07 LAB — COMPREHENSIVE METABOLIC PANEL
ALBUMIN: 4 g/dL (ref 3.5–5.0)
ALK PHOS: 61 U/L (ref 38–126)
ALT: 15 U/L (ref 0–44)
ANION GAP: 6 (ref 5–15)
AST: 16 U/L (ref 15–41)
BILIRUBIN TOTAL: 0.5 mg/dL (ref 0.3–1.2)
BUN: 24 mg/dL — ABNORMAL HIGH (ref 8–23)
CO2: 29 mmol/L (ref 22–32)
Calcium: 9.6 mg/dL (ref 8.9–10.3)
Chloride: 104 mmol/L (ref 98–111)
Creatinine, Ser: 0.67 mg/dL (ref 0.44–1.00)
GFR calc Af Amer: >60 mL/min (ref >60)
GFR calc non Af Amer: >60 mL/min (ref >60)
GLUCOSE: 186 mg/dL — AB (ref 70–99)
Potassium: 4.3 mmol/L (ref 3.5–5.1)
Sodium: 139 mmol/L (ref 135–145)
TOTAL PROTEIN: 7.5 g/dL (ref 6.5–8.1)

## 2017-11-07 LAB — CBC
HEMATOCRIT: 45.7 % (ref 36.0–46.0)
Hemoglobin: 15.4 g/dL — ABNORMAL HIGH (ref 12.0–15.0)
MCH: 30.5 pg (ref 26.0–34.0)
MCHC: 33.7 g/dL (ref 30.0–36.0)
MCV: 90.5 fL (ref 78.0–100.0)
Platelets: 245 10*3/uL (ref 150–400)
RBC: 5.05 MIL/uL (ref 3.87–5.11)
RDW: 14 % (ref 11.5–15.5)
WBC: 9.4 10*3/uL (ref 4.0–10.5)

## 2017-11-07 LAB — URINALYSIS, ROUTINE W REFLEX MICROSCOPIC
BILIRUBIN URINE: NEGATIVE
Glucose, UA: NEGATIVE mg/dL
Hgb urine dipstick: NEGATIVE
Ketones, ur: NEGATIVE mg/dL
Leukocytes, UA: NEGATIVE
NITRITE: NEGATIVE
PH: 7 (ref 5.0–8.0)
Protein, ur: NEGATIVE mg/dL
SPECIFIC GRAVITY, URINE: 1.011 (ref 1.005–1.030)

## 2017-11-07 LAB — TSH: TSH: 1.735 u[IU]/mL (ref 0.350–4.500)

## 2017-11-07 LAB — I-STAT TROPONIN, ED: Troponin i, poc: 0 ng/mL (ref 0.00–0.08)

## 2017-11-07 MED ORDER — METOPROLOL SUCCINATE ER 25 MG PO TB24: 25.0000 mg | ORAL_TABLET | Freq: Every day | ORAL | 0 refills | Status: DC

## 2017-11-07 MED ORDER — ADENOSINE 6 MG/2ML IV SOLN
6.0000 mg | Freq: Once | INTRAVENOUS | Status: DC
  Filled 2017-11-07: qty 2

## 2017-11-07 MED ORDER — SODIUM CHLORIDE 0.9 % IV BOLUS
500.0000 mL | Freq: Once | INTRAVENOUS | Status: AC
  Administered 2017-11-07: 500 mL via INTRAVENOUS

## 2017-11-07 MED ORDER — METOPROLOL TARTRATE 25 MG PO TABS
25.0000 mg | ORAL_TABLET | Freq: Once | ORAL | Status: AC
  Administered 2017-11-07: 25 mg via ORAL
  Filled 2017-11-07: qty 1

## 2017-11-07 MED ORDER — METOPROLOL TARTRATE 5 MG/5ML IV SOLN
5.0000 mg | Freq: Once | INTRAVENOUS | Status: DC
  Filled 2017-11-07: qty 5

## 2017-11-07 NOTE — ED Provider Notes (Signed)
Emergency Department Provider Note   I have reviewed the triage vital signs and the nursing notes.   HISTORY  Chief Complaint Weakness   HPI [place] is a 84 y.o. female with PMH of HTN, HLD, and DM to the emergency department with heart palpitations and lightheadedness this morning.  Patient states that she had moderate to severe symptoms today with sensation of racing heartbeat and lightheadedness.  She felt shaky.  Symptoms were worse with movement.  She denies any chest pain/pressure.  No shortness of breath.  She is recently been dealing with cystitis and baseline abdominal pain from pancreatitis.  She is been compliant with her medications with no changes recently.  She does follow with cardiology with her underlying heart valve issue due to rheumatic fever as a child. No history of known CHF or ACS.  Vision ate breakfast and took a bath and states that her symptoms have resolved since that time.    Past Medical History:  Diagnosis Date  . Arthritis   . Benign neoplasm of colon   . Constipation   . Diaphragmatic hernia without mention of obstruction or gangrene   . Diverticulosis of colon (without mention of hemorrhage)   . Dysphagia, pharyngoesophageal phase   . Esophageal reflux   . Essential hypertension   . Heart murmur   . Hemorrhoids   . Hyperlipidemia   . Internal hemorrhoids without mention of complication   . PONV (postoperative nausea and vomiting)   . Stricture and stenosis of esophagus   . Type 2 diabetes mellitus ([place])   . Unspecified gastritis and gastroduodenitis without mention of hemorrhage     Patient Active Problem List   Diagnosis Date Noted  . Bloating 08/30/2015  . Exertional chest pain 08/16/2015  . Chest pain 08/16/2015  . Mucosal abnormality of stomach   . Hemorrhoids 04/18/2015  . IBS (irritable bowel syndrome) 01/30/2015  . Type 2 diabetes mellitus ([place]) 12/22/2014  . Hyperlipidemia 12/22/2014  . Prolapse of female pelvic  organs 11/28/2013  . Precordial pain 11/25/2013  . Essential hypertension 11/25/2013  . Heart murmur 11/25/2013  . Osteopenia 10/23/2012  . Chronic pancreatitis ([place]) 02/26/2011  . GERD (gastroesophageal reflux disease) 01/18/2011  . Constipation 11/29/2010  . Esophageal spasm 11/29/2010  . DJD (degenerative joint disease) 11/29/2010  . Hx of adenomatous colonic polyps 06/29/2009  . ESOPHAGEAL MOTILITY DISORDER 04/01/2008  . DM 03/01/2008  . HEMORRHOIDS, INTERNAL 07/02/2007  . Diverticulosis of large intestine 07/02/2007    Past Surgical History:  Procedure Laterality Date  . ABDOMINAL HYSTERECTOMY    . BACTERIAL OVERGROWTH TEST N/A 09/06/2015   Procedure: BACTERIAL OVERGROWTH TEST;  Surgeon: Daneil Dolin, MD;  Location: [facility];  Service: Endoscopy;  Laterality: N/A;  800  . BREAST LUMPECTOMY Right    benign  . CATARACT EXTRACTION Bilateral 2006   Implants in both, surgeries done 6 weeks apart  . CHOLECYSTECTOMY    . COLONOSCOPY  03/08/02   [name]: diverticulosis, internal hemorrhoids, adenomatous colon polyp  . COLONOSCOPY  01/23/04   [name]: diverticulosis, internal hemorrhoids  . COLONOSCOPY  09/25/05   [name]: diverticulosis  . COLONOSCOPY  07/05/09   [name]: severe diverticulosis  . COLONOSCOPY  01/21/11   [name]: severe diverticulosis in sigmoid to desc colon, int hemorrhoids, follow up TCS in 5 years  . COLONOSCOPY N/A 04/10/2016   Procedure: COLONOSCOPY;  Surgeon: Daneil Dolin, MD;  Location: [facility];  Service: Endoscopy;  Laterality: N/A;  12:30 PM  .  DILATION AND CURETTAGE OF UTERUS    . ESOPHAGEAL MANOMETRY  03/21/08   Patterson: findings c/w [place] Esophagus  . ESOPHAGOGASTRODUODENOSCOPY  03/08/02   [name]: esophageal stricture, chronic gerd, s/p dilation.   . ESOPHAGOGASTRODUODENOSCOPY  01/23/04   [name]: esophageal stricture, gastritis, hiatal hernia  . ESOPHAGOGASTRODUODENOSCOPY  09/25/05   Patterson: gastritis, benign bx, no  H.pylori  . ESOPHAGOGASTRODUODENOSCOPY  07/05/09   Patterson: gastropathy, benign small bowel bx and gastric bx  . ESOPHAGOGASTRODUODENOSCOPY N/A 05/15/2015   Dr. Gala Romney- diffuse moderate inflammation characterized by congestion (edema), erythema, and linear erosions was found in the entire examined stomach. bx= reactive gastropathy  . FOOT SURGERY Left   . HEMORRHOID BANDING  2017   Dr.[name]  . LAPAROSCOPIC VAGINAL HYSTERECTOMY    . RECTOCELE REPAIR    . TOTAL KNEE ARTHROPLASTY Right     Allergies Adhesive [tape]; Estrogens; and Sulfonamide derivatives  Family History  Problem Relation Age of Onset  . Ovarian cancer Mother   . Diabetes Father   . Heart disease Father   . Breast cancer Sister   . Liver cancer Sister   . Colon cancer Cousin        Paternal side  . Diabetes Maternal Aunt   . Liver disease Cousin        Maternal side, never drank    Social History Social History   Tobacco Use  . Smoking status: Never Smoker  . Smokeless tobacco: Never Used  . Tobacco comment: Never smoked  Substance Use Topics  . Alcohol use: No    Alcohol/week: 0.0 standard drinks  . Drug use: No    Review of Systems  Constitutional: No fever/chills Eyes: No visual changes. ENT: No sore throat. Cardiovascular: Denies chest pain. Positive heart palpitations and lightheadedness.  Respiratory: Denies shortness of breath. Gastrointestinal: No abdominal pain.  No nausea, no vomiting.  No diarrhea.  No constipation. Genitourinary: Negative for dysuria. Musculoskeletal: Negative for back pain. Skin: Negative for rash. Neurological: Negative for headaches, focal weakness or numbness.  10-point ROS otherwise negative.  ____________________________________________   PHYSICAL EXAM:  VITAL SIGNS: ED Triage Vitals  Enc Vitals Group     BP 11/07/17 1226 (!) 154/76     Pulse Rate 11/07/17 1226 98     Resp 11/07/17 1226 18     Temp 11/07/17 1226 98.6 F (37 C)     Temp Source  11/07/17 1226 Oral     SpO2 11/07/17 1226 98 %     Pain Score 11/07/17 1224 2   Constitutional: Alert and oriented. Well appearing and in no acute distress. Eyes: Conjunctivae are normal. PERRL. Head: Atraumatic. Nose: No congestion/rhinnorhea. Mouth/Throat: Mucous membranes are moist.  Oropharynx non-erythematous. Neck: No stridor.  Cardiovascular: Normal rate, regular rhythm with occasional PVCs. Good peripheral circulation. 4/6 systolic murmur appreciated.  Respiratory: Normal respiratory effort.  No retractions. Lungs CTAB. Gastrointestinal: Soft and nontender. No distention.  Musculoskeletal: No lower extremity tenderness nor edema. No gross deformities of extremities. Neurologic:  Normal speech and language. No gross focal neurologic deficits are appreciated.  Skin:  Skin is warm, dry and intact. No rash noted. ____________________________________________   LABS (all labs ordered are listed, but only abnormal results are displayed)  Labs Reviewed  CBC - Abnormal; Notable for the following components:      Result Value   Hemoglobin 15.4 (*)    All other components within normal limits  URINALYSIS, ROUTINE W REFLEX MICROSCOPIC - Abnormal; Notable for the following components:   Color,  Urine STRAW (*)    All other components within normal limits  COMPREHENSIVE METABOLIC PANEL - Abnormal; Notable for the following components:   Glucose, Bld 186 (*)    BUN 24 (*)    All other components within normal limits  MAGNESIUM  TSH  I-STAT TROPONIN, ED   ____________________________________________  EKG   EKG Interpretation  Date/Time:  Friday November 07 2017 14:35:42 EDT Ventricular Rate:  124 PR Interval:    QRS Duration: 123 QT Interval:  328 QTC Calculation: 472 R Axis:   89 Text Interpretation:  Junctional tachycardia Nonspecific intraventricular conduction delay No STEMI. Change from arrival EKG  Confirmed by Nanda Quinton [id number]) on 11/07/2017 2:40:32 PM        ____________________________________________  RADIOLOGY  Dg Chest 2 View  Result Date: 11/07/2017 CLINICAL DATA:  Tachycardia and weakness. EXAM: CHEST - 2 VIEW COMPARISON:  Chest x-ray 02/25/2016 FINDINGS: The cardiac silhouette, mediastinal and hilar contours are normal and stable. There is mild tortuosity and calcification of the thoracic aorta. The lungs are clear. No pleural effusion and no worrisome pulmonary lesions. The bony thorax is intact. IMPRESSION: No acute cardiopulmonary findings. Electronically Signed   By: Marijo Sanes M.D.   On: 11/07/2017 13:10    ____________________________________________   PROCEDURES  Procedure(s) performed:   Procedures  None ____________________________________________   INITIAL IMPRESSION / ASSESSMENT AND PLAN / ED COURSE  Pertinent labs & imaging results that were available during my care of the patient were reviewed by me and considered in my medical decision making (see chart for details).  Patient presents to the emergency department for evaluation of heart palpitations with lightheadedness.  Her symptoms have resolved.  She has a normal EKG.  As I am in the room with her she is having occasional PVCs on the monitor states this was not the symptom pattern that brought her to the emergency department.  Plan for labs including TSH along with chest x-ray.   3:47 PM Spoke with Dr. Domenic Polite with Cardiology. Will try vagal maneuvers and adenosine if it doesn't break. Plan for Lopressor 25 mg daily and outpatient Cardiology follow up.  3:47 PM Setting up for adenosine and patient now in mostly sinus rhythm with rate in the 90s. Monitored for several minutes and wound have episodes of elevated HR and junctional/re-entrant rhythm and then resolve without intervention. Plan for PO Metoprolol here and will take 25 mg daily at home. Patient to return with any new or worsening symptoms. Patient has been ambulatory here without significant  lightheadedness.    I reviewed all nursing notes, vitals, pertinent old records, EKGs, labs, imaging (as available).  ____________________________________________  FINAL CLINICAL IMPRESSION(S) / ED DIAGNOSES  Final diagnoses:  Palpitations  AV reentrant tachycardia ([place])     MEDICATIONS GIVEN DURING THIS VISIT:  Medications  metoprolol tartrate (LOPRESSOR) tablet 25 mg (has no administration in time range)  sodium chloride 0.9 % bolus 500 mL ( Intravenous Stopped 11/07/17 1349)     NEW OUTPATIENT MEDICATIONS STARTED DURING THIS VISIT:  New Prescriptions   METOPROLOL SUCCINATE (TOPROL-XL) 25 MG 24 HR TABLET    Take 1 tablet (25 mg total) by mouth daily.    Note:  This document was prepared using Dragon voice recognition software and may include unintentional dictation errors.  Nanda Quinton, MD Emergency Medicine    Long, [facility], MD 11/07/17 [phone]

## 2017-11-07 NOTE — Discharge Instructions (Addendum)
You were seen in the ED today with heart palpitations. Your heart is occasionally going into a fast rhythm. I am starting some medication called Metoprolol to slow down your heart after speaking with your Cardiologist, Dr. Domenic Polite. Call their office this afternoon or Monday to scheduled the soonest available appointment. Return to the ED and/or call 911 if you develop any sudden worsening symptoms such as chest pain, trouble breathing, lightheadedness, or other concerning symptoms.

## 2017-11-07 NOTE — ED Triage Notes (Signed)
Pt states has hx of high hr. States was high this am. Was weak in legs today and shaking but states is much better now. A/o. Pt also c/o pain to epigastric area which is chronic for her per pt due to pancreatitis

## 2017-11-10 ENCOUNTER — Telehealth: Payer: Self-pay | Admitting: Family Medicine

## 2017-11-10 NOTE — Telephone Encounter (Signed)
Patient notified and scheduled follow up office visit.

## 2017-11-10 NOTE — Telephone Encounter (Signed)
Please advise 

## 2017-11-10 NOTE — Telephone Encounter (Signed)
Ov tomorrow here

## 2017-11-10 NOTE — Telephone Encounter (Signed)
ER said pt needs to see cardio, they can't see her until 11/24/17, pt was told to be seen the first of the week  Need permission to give pt a samday for ER follow up  Please advise

## 2017-11-11 ENCOUNTER — Other Ambulatory Visit: Payer: Self-pay | Admitting: *Deleted

## 2017-11-11 ENCOUNTER — Encounter: Payer: Self-pay | Admitting: Family Medicine

## 2017-11-11 ENCOUNTER — Ambulatory Visit (INDEPENDENT_AMBULATORY_CARE_PROVIDER_SITE_OTHER): Payer: Medicare Other | Admitting: Family Medicine

## 2017-11-11 ENCOUNTER — Telehealth: Payer: Self-pay | Admitting: Family Medicine

## 2017-11-11 VITALS — BP 110/72 | Temp 97.5°F | Ht 64.0 in | Wt 154.0 lb

## 2017-11-11 DIAGNOSIS — E1165 Type 2 diabetes mellitus with hyperglycemia: Secondary | ICD-10-CM | POA: Diagnosis not present

## 2017-11-11 DIAGNOSIS — I10 Essential (primary) hypertension: Secondary | ICD-10-CM

## 2017-11-11 DIAGNOSIS — I498 Other specified cardiac arrhythmias: Secondary | ICD-10-CM

## 2017-11-11 NOTE — Telephone Encounter (Signed)
yes

## 2017-11-11 NOTE — Telephone Encounter (Signed)
Upon checkout patient asked that we remove the following medicines from her list as she is not using them   clidinium-chlordiazePOXIDE (LIBRAX) 5-2.5 MG capsule  gabapentin (NEURONTIN) 100 MG capsule  ketoconazole (NIZORAL) 2 % cream   triamcinolone cream (KENALOG) 0.1 %

## 2017-11-11 NOTE — Progress Notes (Signed)
   Subjective:    Patient ID: Deborah Gray, female    DOB: 11-10-1933, 84 y.o.   MRN: [id number] Patient arrives with numerous concerns HPIED follow up. Went to ED on 9/20 for weakness in arms and legs, elevated heart rate and felt faint.   pt has had sig challenges with sciativa,  Pt had MRI, and bone spurs, and was heading towards an injection  Bladder treatment seemed to have trigger  afainting tye sensation, and felt a ittle dizines, pt noted weird feloing in the arms, went to the ER  Felt very weak, unable to do usual activities   Was ded with junctional rhythm and also pvc's  Goes to dr Tresa [facility], saw dr Tonita Cong, who interpreted mri, now due to see dr in  [place] to th ER   has appt with cardiologist on October 7th.   Patient states her sugars have been more elevated lately.  A1c was excellent several months ago Review of Systems No headache, no major weight loss or weight gain, no chest pain no back pain abdominal pain no change in bowel habits complete ROS otherwise negative     Objective:   Physical Exam  Alert and oriented, vitals reviewed and stable, NAD ENT-TM's and ext canals WNL bilat via otoscopic exam Soft palate, tonsils and post pharynx WNL via oropharyngeal exam Neck-symmetric, no masses; thyroid nonpalpable and nontender Pulmonary-no tachypnea or accessory muscle use; Clear without wheezes via auscultation Card--no abnrml murmurs, rhythm reg and rate WNL Carotid pulses symmetric, without bruits       Assessment & Plan:  Impression 1 type 2 diabetes.  Control suboptimum discussed with patient maintain full dose of metformin  2.  Sciatica.  Midst of work-up due to have injections soon  3.  Emergency room visit.  Complete ER notes and results all reviewed today in presence of patient.  She had a true junctional rhythm.  Reverted back in the sinus with occasional PVCs.  Given metoprolol low-dose.  Handling well.  To maintain pending  cardiologist visit  Greater than 50% of this 25 minute face to face visit was spent in counseling and discussion and coordination of care regarding the above diagnosis/diagnosies  Follow-up regular appointment

## 2017-11-11 NOTE — Telephone Encounter (Signed)
Librax and neurontin prescribed by specialist. Is it ok to remove from list

## 2017-11-11 NOTE — Telephone Encounter (Signed)
Med list updated

## 2017-11-18 DIAGNOSIS — M5136 Other intervertebral disc degeneration, lumbar region: Secondary | ICD-10-CM | POA: Diagnosis not present

## 2017-11-19 ENCOUNTER — Other Ambulatory Visit: Payer: Self-pay | Admitting: Family Medicine

## 2017-11-19 NOTE — Telephone Encounter (Signed)
Tried to call no answer to see why pt is taking four a day. Med list states three a day.

## 2017-11-19 NOTE — Telephone Encounter (Signed)
Pt is needing a refill on her metFORMIN (GLUCOPHAGE) 500 MG tablet. Please send to [id number] [place], [place]. She is taking 4 a day now. One in the morning one at lunch and two at night.

## 2017-11-21 ENCOUNTER — Other Ambulatory Visit: Payer: Self-pay | Admitting: *Deleted

## 2017-11-21 DIAGNOSIS — M2040 Other hammer toe(s) (acquired), unspecified foot: Secondary | ICD-10-CM | POA: Diagnosis not present

## 2017-11-21 DIAGNOSIS — E1142 Type 2 diabetes mellitus with diabetic polyneuropathy: Secondary | ICD-10-CM | POA: Diagnosis not present

## 2017-11-21 DIAGNOSIS — M76822 Posterior tibial tendinitis, left leg: Secondary | ICD-10-CM | POA: Diagnosis not present

## 2017-11-21 DIAGNOSIS — M76821 Posterior tibial tendinitis, right leg: Secondary | ICD-10-CM | POA: Diagnosis not present

## 2017-11-21 MED ORDER — METFORMIN HCL 500 MG PO TABS: ORAL_TABLET | ORAL | 1 refills | Status: DC

## 2017-11-21 NOTE — Telephone Encounter (Signed)
Patient stated she was told to do 2 in am and 3 in pm by Dr Richardson Landry but that upset her stomach so she spaced it out to one in am and one at lunch and 2 at night- if her sugars seem to be running low she cuts back to one tablet 3 times a day. Patient states she has a follow up visit scheduled in November but needs refill today. Please advise

## 2017-11-21 NOTE — Telephone Encounter (Signed)
Left message to return call 

## 2017-11-21 NOTE — Telephone Encounter (Signed)
Ok write enough for four per day six mo worth

## 2017-11-21 NOTE — Telephone Encounter (Signed)
Refill sent to pharm with new directions. Pt notified on voicemail.

## 2017-11-24 ENCOUNTER — Ambulatory Visit (INDEPENDENT_AMBULATORY_CARE_PROVIDER_SITE_OTHER): Payer: Medicare Other | Admitting: Cardiovascular Disease

## 2017-11-24 ENCOUNTER — Encounter: Payer: Self-pay | Admitting: Cardiovascular Disease

## 2017-11-24 VITALS — BP 124/68 | HR 86 | Ht 64.0 in | Wt 154.0 lb

## 2017-11-24 DIAGNOSIS — I10 Essential (primary) hypertension: Secondary | ICD-10-CM | POA: Diagnosis not present

## 2017-11-24 DIAGNOSIS — Z9289 Personal history of other medical treatment: Secondary | ICD-10-CM | POA: Diagnosis not present

## 2017-11-24 DIAGNOSIS — I471 Supraventricular tachycardia, unspecified: Secondary | ICD-10-CM

## 2017-11-24 NOTE — Patient Instructions (Signed)
Medication Instructions:  Your physician recommends that you continue on your current medications as directed. Please refer to the Current Medication list given to you today.  If you need a refill on your cardiac medications before your next appointment, please call your pharmacy.   Lab work: NONE If you have labs (blood work) drawn today and your tests are completely normal, you will receive your results only by: Marland Kitchen MyChart Message (if you have MyChart) OR . A paper copy in the mail If you have any lab test that is abnormal or we need to change your treatment, we will call you to review the results.  Testing/Procedures: NONE  Follow-Up: At [facility], you and your health needs are our priority.  As part of our continuing mission to provide you with exceptional heart care, we have created designated Provider Care Teams.  These Care Teams include your primary Cardiologist (physician) and Advanced Practice Providers (APPs -  Physician Assistants and Nurse Practitioners) who all work together to provide you with the care you need, when you need it. You will need a follow up appointment in 6 months.  Please call our office 2 months in advance to schedule this appointment.  You may see Kate Sable, MD or one of the following Advanced Practice Providers on your designated Care Team:   Bernerd Pho, PA-C [facility]) . Ermalinda Barrios, PA-C ([place])  Any Other Special Instructions Will Be Listed Below (If Applicable). NONE

## 2017-11-24 NOTE — Progress Notes (Signed)
SUBJECTIVE: The patient presents for routine evaluation.  It appears she saw Dr. Harl Bowie for chest pain while hospitalized in June 2017.  She saw Dr. Domenic Polite prior to that in 2015.  Symptoms appeared atypical.  She has a long history of GI/epigastric pain with negative stress test in 2006, 2015, and most recently on 08/17/2015.  Echocardiogram on 12/06/2013 demonstrated vigorous left ventricular systolic function, LVEF 65 to 70%, mild LVH, grade 1 diastolic dysfunction with increased filling pressures, mildly sclerotic aortic valve with trivial mitral and tricuspid regurgitation.  She was evaluated in the ED on 11/07/2017 for palpitations, lightheadedness, and weakness.  When she was on telemetry she was noted to have occasional PVCs.  The ED physician spoke with Dr. Domenic Polite who recommended trying vagal maneuvers and adenosine as well as Lopressor.  Heart rate eventually went down to sinus in the 90 bpm range.  Troponin, TSH, and magnesium were normal.  CBC, comprehensive metabolic panel, and urinalysis were unremarkable.  I personally reviewed all ECGs.  The initial ECG at 1230 on 11/07/2017 showed normal sinus rhythm, 95 bpm. Subsequent ECG showed SVT, 124 bpm.  She told me about a multitude of somatic complaints including an upset stomach and left leg sciatica.  She currently denies chest pain, palpitations, shortness of breath.  Review of Systems: As per "subjective", otherwise negative.  Allergies  Allergen Reactions  . Adhesive [Tape] Other (See Comments)    Reaction:  Blisters   . Estrogens Other (See Comments)    Reaction:  Migraines   . Sulfonamide Derivatives Rash    Current Outpatient Medications  Medication Sig Dispense Refill  . acetaminophen (TYLENOL) 500 MG tablet Take 500 mg at bedtime by mouth.     Marland Kitchen aspirin EC 81 MG tablet Take 81 mg by mouth at bedtime.    Marland Kitchen azelastine (ASTELIN) 0.1 % nasal spray Place 1 spray into both nostrils 2 (two) times daily as needed  for rhinitis. 30 mL 11  . Carboxymethylcellulose Sodium (THERATEARS) 0.25 % SOLN Apply 1 drop 2 (two) times daily to eye.    . dibucaine (NUPERCAINAL) 1 % ointment Apply topically as needed for pain.    Marland Kitchen ezetimibe (ZETIA) 10 MG tablet TAKE 1 TABLET BY MOUTH EVERY DAY Needs office visit. 90 tablet 0  . guaiFENesin (MUCINEX) 600 MG 12 hr tablet Take 600 mg by mouth 2 (two) times daily as needed for cough or to loosen phlegm.     Marland Kitchen imipramine (TOFRANIL) 10 MG tablet Take 1 tablet (10 mg total) by mouth at bedtime. 90 tablet 3  . isometheptene-acetaminophen-dichloralphenazone (MIDRIN) 65-100-325 MG capsule TAKE ONE CAPSULE BY MOUTH 4 TIMES A DAY AS NEEDED FOR HEADACHE 30 capsule 2  . losartan (COZAAR) 50 MG tablet Take 1 tablet (50 mg total) by mouth every evening. 90 tablet 1  . meclizine (ANTIVERT) 25 MG tablet TAKE 1 TABLET (25 MG TOTAL) BY MOUTH 3 (THREE) TIMES DAILY AS NEEDED FOR DIZZINESS OR NAUSEA. (Patient taking differently: Take 25-50 mg 3 (three) times daily as needed by mouth for dizziness or nausea. TAKE 1 TABLET (25 MG TOTAL) BY MOUTH 3 (THREE) TIMES DAILY AS NEEDED FOR DIZZINESS OR NAUSEA.) 30 tablet 6  . metFORMIN (GLUCOPHAGE) 500 MG tablet Take one qam, one at lunch and 2 qhs 360 tablet 1  . methenamine (HIPREX) 1 g tablet Take 1 g by mouth 2 (two) times daily.  3  . mirabegron ER (MYRBETRIQ) 25 MG TB24 tablet Take 50 mg every  morning by mouth.     . Multiple Vitamins-Minerals (PRESERVISION AREDS 2) CAPS Take 1 capsule by mouth 2 (two) times daily.     . nateglinide (STARLIX) 120 MG tablet TAKE 1 TABLET BY MOUTH 3 TIMES A DAY BEFORE MEALS 270 tablet 1  . omega-3 acid ethyl esters (LOVAZA) 1 g capsule Take 4 g by mouth daily.    Marland Kitchen omeprazole (PRILOSEC) 20 MG capsule TAKE ONE CAPSULE BY MOUTH EVERY MORNING BEFORE BREAKFAST 90 capsule 3  . Pancrelipase, Lip-Prot-Amyl, (CREON) 24000-76000 units CPEP TAKE 3 CAPSULES THREE TIMES DAILY WITH MEALS AND ONE CAPSULE WITH SNACK TWICE DAILY 990  capsule 2  . vitamin E 400 UNIT capsule Take 400 Units by mouth 4 (four) times a week. Pt takes on Tuesday, Thursday, Saturday, and Sunday.    . Vitamin Mixture (ESTER-C) 500-60 MG TABS Take 1 tablet by mouth 4 (four) times a week. Pt takes on Sunday, Monday, Wednesday, and Friday.    . Wheat Dextrin (BENEFIBER DRINK MIX) PACK Take by mouth daily.     No current facility-administered medications for this visit.     Past Medical History:  Diagnosis Date  . Arthritis   . Benign neoplasm of colon   . Constipation   . Diaphragmatic hernia without mention of obstruction or gangrene   . Diverticulosis of colon (without mention of hemorrhage)   . Dysphagia, pharyngoesophageal phase   . Esophageal reflux   . Essential hypertension   . Heart murmur   . Hemorrhoids   . Hyperlipidemia   . Internal hemorrhoids without mention of complication   . PONV (postoperative nausea and vomiting)   . Stricture and stenosis of esophagus   . Type 2 diabetes mellitus ([place])   . Unspecified gastritis and gastroduodenitis without mention of hemorrhage     Past Surgical History:  Procedure Laterality Date  . ABDOMINAL HYSTERECTOMY    . BACTERIAL OVERGROWTH TEST N/A 09/06/2015   Procedure: BACTERIAL OVERGROWTH TEST;  Surgeon: Daneil Dolin, MD;  Location: [facility];  Service: Endoscopy;  Laterality: N/A;  800  . BREAST LUMPECTOMY Right    benign  . CATARACT EXTRACTION Bilateral 2006   Implants in both, surgeries done 6 weeks apart  . CHOLECYSTECTOMY    . COLONOSCOPY  03/08/02   [name]: diverticulosis, internal hemorrhoids, adenomatous colon polyp  . COLONOSCOPY  01/23/04   [name]: diverticulosis, internal hemorrhoids  . COLONOSCOPY  09/25/05   [name]: diverticulosis  . COLONOSCOPY  07/05/09   [name]: severe diverticulosis  . COLONOSCOPY  01/21/11   [name]: severe diverticulosis in sigmoid to desc colon, int hemorrhoids, follow up TCS in 5 years  . COLONOSCOPY N/A 04/10/2016   Procedure:  COLONOSCOPY;  Surgeon: Daneil Dolin, MD;  Location: [facility];  Service: Endoscopy;  Laterality: N/A;  12:30 PM  . DILATION AND CURETTAGE OF UTERUS    . ESOPHAGEAL MANOMETRY  03/21/08   Patterson: findings c/w [place] Esophagus  . ESOPHAGOGASTRODUODENOSCOPY  03/08/02   [name]: esophageal stricture, chronic gerd, s/p dilation.   . ESOPHAGOGASTRODUODENOSCOPY  01/23/04   [name]: esophageal stricture, gastritis, hiatal hernia  . ESOPHAGOGASTRODUODENOSCOPY  09/25/05   Patterson: gastritis, benign bx, no H.pylori  . ESOPHAGOGASTRODUODENOSCOPY  07/05/09   Patterson: gastropathy, benign small bowel bx and gastric bx  . ESOPHAGOGASTRODUODENOSCOPY N/A 05/15/2015   Dr. Gala Romney- diffuse moderate inflammation characterized by congestion (edema), erythema, and linear erosions was found in the entire examined stomach. bx= reactive gastropathy  . FOOT SURGERY Left   .  HEMORRHOID BANDING  2017   Dr.[name]  . LAPAROSCOPIC VAGINAL HYSTERECTOMY    . RECTOCELE REPAIR    . TOTAL KNEE ARTHROPLASTY Right     Social History   Socioeconomic History  . Marital status: Married    Spouse name: Not on file  . Number of children: 3  . Years of education: Not on file  . Highest education level: Not on file  Occupational History  . Occupation: Retired  Scientific laboratory technician  . Financial resource strain: Not on file  . Food insecurity:    Worry: Not on file    Inability: Not on file  . Transportation needs:    Medical: Not on file    Non-medical: Not on file  Tobacco Use  . Smoking status: Never Smoker  . Smokeless tobacco: Never Used  . Tobacco comment: Never smoked  Substance and Sexual Activity  . Alcohol use: No    Alcohol/week: 0.0 standard drinks  . Drug use: No  . Sexual activity: Not on file  Lifestyle  . Physical activity:    Days per week: Not on file    Minutes per session: Not on file  . Stress: Not on file  Relationships  . Social connections:    Talks on phone: Not on file    Gets  together: Not on file    Attends religious service: Not on file    Active member of club or organization: Not on file    Attends meetings of clubs or organizations: Not on file    Relationship status: Not on file  . Intimate partner violence:    Fear of current or ex partner: Not on file    Emotionally abused: Not on file    Physically abused: Not on file    Forced sexual activity: Not on file  Other Topics Concern  . Not on file  Social History Narrative  . Not on file     Vitals:   11/24/17 1120  BP: 124/68  Pulse: 86  SpO2: 98%  Weight: 154 lb (69.9 kg)  Height: 5\' 4"  (1.626 m)    Wt Readings from Last 3 Encounters:  11/24/17 154 lb (69.9 kg)  11/11/17 154 lb (69.9 kg)  09/02/17 159 lb 6.4 oz (72.3 kg)     PHYSICAL EXAM General: NAD HEENT: Normal. Neck: No JVD, no thyromegaly. Lungs: Clear to auscultation bilaterally with normal respiratory effort. CV: Regular rate and rhythm, normal S1/S2, no S3/S4, no murmur. No pretibial or periankle edema.  No carotid bruit.   Abdomen: Soft, nontender, no distention.  Neurologic: Alert and oriented.  Psych: Normal affect. Skin: Normal. Musculoskeletal: No gross deformities.    ECG: Reviewed above under Subjective   Labs: Lab Results  Component Value Date/Time   K 4.3 11/07/2017 12:48 PM   BUN 24 (H) 11/07/2017 12:48 PM   BUN 23 06/25/2017 10:57 AM   CREATININE 0.67 11/07/2017 12:48 PM   CREATININE 0.78 11/22/2013 08:52 AM   ALT 15 11/07/2017 12:48 PM   TSH 1.735 11/07/2017 12:48 PM   TSH 2.890 07/10/2017 01:58 PM   HGB 15.4 (H) 11/07/2017 12:48 PM   HGB 14.4 07/10/2017 01:58 PM     Lipids: Lab Results  Component Value Date/Time   LDLCALC 120 (H) 06/25/2017 10:57 AM   CHOL 200 (H) 06/25/2017 10:57 AM   TRIG 124 06/25/2017 10:57 AM   HDL 55 06/25/2017 10:57 AM       ASSESSMENT AND PLAN: 1.  PSVT: Symptomatically stable.  No longer on beta-blockers.  No cardiac testing is indicated at this time.  2.   Hypertension: Blood pressures controlled on losartan 50 mg daily.  No changes.   Disposition: Follow up in 6 months with [facility].     Kate Sable, M.D., F.A.C.C.

## 2017-12-01 DIAGNOSIS — H34832 Tributary (branch) retinal vein occlusion, left eye, with macular edema: Secondary | ICD-10-CM | POA: Diagnosis not present

## 2017-12-02 ENCOUNTER — Ambulatory Visit: Payer: Medicare Other | Admitting: Internal Medicine

## 2017-12-02 DIAGNOSIS — E119 Type 2 diabetes mellitus without complications: Secondary | ICD-10-CM | POA: Diagnosis not present

## 2017-12-02 DIAGNOSIS — M1712 Unilateral primary osteoarthritis, left knee: Secondary | ICD-10-CM | POA: Diagnosis not present

## 2017-12-02 DIAGNOSIS — M48061 Spinal stenosis, lumbar region without neurogenic claudication: Secondary | ICD-10-CM | POA: Insufficient documentation

## 2017-12-02 DIAGNOSIS — M419 Scoliosis, unspecified: Secondary | ICD-10-CM | POA: Insufficient documentation

## 2017-12-02 DIAGNOSIS — M5136 Other intervertebral disc degeneration, lumbar region: Secondary | ICD-10-CM | POA: Diagnosis not present

## 2017-12-02 DIAGNOSIS — M545 Low back pain: Secondary | ICD-10-CM | POA: Diagnosis not present

## 2017-12-03 ENCOUNTER — Ambulatory Visit (INDEPENDENT_AMBULATORY_CARE_PROVIDER_SITE_OTHER): Payer: Medicare Other | Admitting: Urology

## 2017-12-03 DIAGNOSIS — N3020 Other chronic cystitis without hematuria: Secondary | ICD-10-CM | POA: Diagnosis not present

## 2017-12-04 ENCOUNTER — Other Ambulatory Visit: Payer: Self-pay | Admitting: Family Medicine

## 2017-12-04 DIAGNOSIS — Z1231 Encounter for screening mammogram for malignant neoplasm of breast: Secondary | ICD-10-CM

## 2017-12-10 ENCOUNTER — Ambulatory Visit (HOSPITAL_COMMUNITY)
Admission: RE | Admit: 2017-12-10 | Discharge: 2017-12-10 | Disposition: A | Discharge status: Home / Self Care | Payer: Medicare Other | Source: Ambulatory Visit | Attending: Family Medicine | Admitting: Family Medicine

## 2017-12-10 DIAGNOSIS — Z1231 Encounter for screening mammogram for malignant neoplasm of breast: Secondary | ICD-10-CM | POA: Diagnosis not present

## 2017-12-12 ENCOUNTER — Telehealth: Payer: Self-pay | Admitting: Internal Medicine

## 2017-12-12 NOTE — Telephone Encounter (Signed)
Would not necessarily recommend stool studies at this time.  Agree with continuing Levsin on a as needed basis.  Appointment as scheduled.

## 2017-12-12 NOTE — Telephone Encounter (Signed)
Pt is having diarrhea off and on x 2 weeks and wondered if she needed lab work done prior to her apt for the diarrhea. Stool was watery and now pt feels her stool is loose and food isn't digesting. Pt has stomach pain when she's eating or when she hasn't eaten during the day. Pt hasn't taken any new medication decides a back and knee injection. Pt to some imodium last week when diarrhea was at it's worse. Pt has also taken Hyoscyamine 1-2 times daily for the muscle spasm. No nausea, no vomiting. Apt is Tuesday 10/29 with RMR

## 2017-12-12 NOTE — Telephone Encounter (Signed)
Pt has OV with RMR on Tuesday and wanted to know if she should have labs done prior and was asking what should she be eating and not eating. Please call her at [phone]

## 2017-12-12 NOTE — Telephone Encounter (Signed)
Noted. Spouse notified and will discuss with spouse.

## 2017-12-16 ENCOUNTER — Ambulatory Visit (INDEPENDENT_AMBULATORY_CARE_PROVIDER_SITE_OTHER): Payer: Medicare Other | Admitting: Internal Medicine

## 2017-12-16 ENCOUNTER — Encounter: Payer: Self-pay | Admitting: Internal Medicine

## 2017-12-16 VITALS — BP 149/76 | HR 96 | Temp 97.1°F | Ht 64.0 in | Wt 151.2 lb

## 2017-12-16 DIAGNOSIS — K582 Mixed irritable bowel syndrome: Secondary | ICD-10-CM | POA: Diagnosis not present

## 2017-12-16 DIAGNOSIS — K219 Gastro-esophageal reflux disease without esophagitis: Secondary | ICD-10-CM

## 2017-12-16 DIAGNOSIS — K648 Other hemorrhoids: Secondary | ICD-10-CM

## 2017-12-16 MED ORDER — OMEPRAZOLE 40 MG PO CPDR: 40.0000 mg | DELAYED_RELEASE_CAPSULE | Freq: Every day | ORAL | 11 refills | Status: DC

## 2017-12-16 MED ORDER — CILIDINIUM-CHLORDIAZEPOXIDE 2.5-5 MG PO CAPS: 1.0000 | ORAL_CAPSULE | Freq: Three times a day (TID) | ORAL | 5 refills | Status: DC

## 2017-12-16 NOTE — Progress Notes (Signed)
[place] banding procedure note:  The patient presents with symptomatic grade 3 hemorrhoids; episodes of alternating constipation and diarrhea -predominantly more diarrhea recently.  She is going up and down with Benefiber dosing daily depending on bowel function.  Using out-of-date Levsin with some improvement.  She has one small area of hemorrhoidal tag protrusion wishes to have further banding as appropriate today.   All risks, benefits, and alternative forms of therapy were described and informed consent was obtained.  In the left lateral decubitus position, DRE revealed a small right posterior lateral grade 3 hemorrhoid  -  otherwise negative. Marland Kitchen Utilizing K-Y jelly and Xylocaine 2% cream asi lubricant,  digital rectal exam revealed no other abnormalities.  One band placed on the right posterior column.  Excellent position on follow-up palpation.  No pinching or pain.  Also, patient has worsening hoarseness from time to time.  Wonder if she is on enough omeprazole-taking 20 mg daily.  No typical reflux symptoms.  Librax previously worked very well for her IBS symptoms.   The patient was discharged home without pain or other issues. Dietary and behavioral recommendations were given  -patient stay on a steady dose of Benefiber 1 tablespoon daily.  Increase omeprazole to 40 mg daily.  Stop hyoscyamine.  Trial of Librax 1 capsule before meals and at bedtime.  Continue pancreatic enzyme supplements.    Office visit here in 6 weeks  No complications were encountered and the patient tolerated the procedure well.

## 2017-12-16 NOTE — Patient Instructions (Signed)
Avoid straining.  Benefiber 1 tablespoon daily  Limit toilet time to 5 minutes  Prescription for Librax capsules.  Dispense 40.  Take 1 tablet orally before meals and at bedtime as needed for abdominal cramps and diarrhea.  5 refills.  Increase omeprazole to 40 mg daily (dispense 30 pills) with 11 refills.  Continue pancreatic enzyme supplements.  Call with any interim problems  Schedule followup appointment in 6 weeks from now

## 2017-12-17 ENCOUNTER — Other Ambulatory Visit: Payer: Self-pay | Admitting: Internal Medicine

## 2017-12-23 ENCOUNTER — Telehealth: Payer: Self-pay | Admitting: Internal Medicine

## 2017-12-23 NOTE — Telephone Encounter (Signed)
Pt has questions about her medication and needs the nurse to call her back at [phone]

## 2017-12-25 DIAGNOSIS — M48061 Spinal stenosis, lumbar region without neurogenic claudication: Secondary | ICD-10-CM | POA: Diagnosis not present

## 2017-12-25 DIAGNOSIS — M5136 Other intervertebral disc degeneration, lumbar region: Secondary | ICD-10-CM | POA: Diagnosis not present

## 2017-12-30 ENCOUNTER — Telehealth: Payer: Self-pay | Admitting: Family Medicine

## 2017-12-30 DIAGNOSIS — E782 Mixed hyperlipidemia: Secondary | ICD-10-CM

## 2017-12-30 DIAGNOSIS — E1165 Type 2 diabetes mellitus with hyperglycemia: Secondary | ICD-10-CM

## 2017-12-30 NOTE — Telephone Encounter (Signed)
Pt.notified

## 2017-12-30 NOTE — Telephone Encounter (Signed)
Last labs: 9/19 - Cmet, TSH, Mg and CBC                  5/19 - Lipid, Hgb A1c

## 2017-12-30 NOTE — Telephone Encounter (Signed)
Patient is needing labs done before her physical next week.

## 2017-12-30 NOTE — Telephone Encounter (Signed)
Blood work ordered in Epic. Left message to return call to notify patient. 

## 2017-12-30 NOTE — Telephone Encounter (Signed)
Lip A1c

## 2017-12-31 ENCOUNTER — Ambulatory Visit (INDEPENDENT_AMBULATORY_CARE_PROVIDER_SITE_OTHER): Payer: Medicare Other | Admitting: Urology

## 2017-12-31 DIAGNOSIS — N3020 Other chronic cystitis without hematuria: Secondary | ICD-10-CM

## 2017-12-31 DIAGNOSIS — E782 Mixed hyperlipidemia: Secondary | ICD-10-CM | POA: Diagnosis not present

## 2017-12-31 DIAGNOSIS — E1165 Type 2 diabetes mellitus with hyperglycemia: Secondary | ICD-10-CM | POA: Diagnosis not present

## 2017-12-31 NOTE — Telephone Encounter (Signed)
Spoke with pt and her husband paid $50 for the medication because he didn't want to mess the insurance up. Pt is aware that [employer] wont mess the insurance up and they can always call back if they want to get the coupon.

## 2017-12-31 NOTE — Telephone Encounter (Signed)
Lmom, waiting on a return call. Librax coupon through [employer] is the best option of getting medication covered.

## 2018-01-01 LAB — HEMOGLOBIN A1C
ESTIMATED AVERAGE GLUCOSE: 148 mg/dL
HEMOGLOBIN A1C: 6.8 % — AB (ref 4.8–5.6)

## 2018-01-01 LAB — LIPID PANEL
CHOLESTEROL TOTAL: 193 mg/dL (ref 100–199)
Chol/HDL Ratio: 3.3 ratio (ref 0.0–4.4)
HDL: 59 mg/dL (ref >39)
LDL CALC: 111 mg/dL — AB (ref 0–99)
Triglycerides: 115 mg/dL (ref 0–149)
VLDL Cholesterol Cal: 23 mg/dL (ref 5–40)

## 2018-01-07 DIAGNOSIS — M5136 Other intervertebral disc degeneration, lumbar region: Secondary | ICD-10-CM | POA: Diagnosis not present

## 2018-01-07 DIAGNOSIS — E119 Type 2 diabetes mellitus without complications: Secondary | ICD-10-CM | POA: Diagnosis not present

## 2018-01-07 DIAGNOSIS — M419 Scoliosis, unspecified: Secondary | ICD-10-CM | POA: Diagnosis not present

## 2018-01-07 DIAGNOSIS — M48061 Spinal stenosis, lumbar region without neurogenic claudication: Secondary | ICD-10-CM | POA: Diagnosis not present

## 2018-01-08 ENCOUNTER — Encounter: Payer: Self-pay | Admitting: Family Medicine

## 2018-01-08 ENCOUNTER — Ambulatory Visit (INDEPENDENT_AMBULATORY_CARE_PROVIDER_SITE_OTHER): Payer: Medicare Other | Admitting: Family Medicine

## 2018-01-08 VITALS — BP 122/78 | Temp 97.7°F | Ht 61.75 in | Wt 149.0 lb

## 2018-01-08 DIAGNOSIS — E782 Mixed hyperlipidemia: Secondary | ICD-10-CM

## 2018-01-08 DIAGNOSIS — Z23 Encounter for immunization: Secondary | ICD-10-CM | POA: Diagnosis not present

## 2018-01-08 DIAGNOSIS — K588 Other irritable bowel syndrome: Secondary | ICD-10-CM

## 2018-01-08 DIAGNOSIS — E7800 Pure hypercholesterolemia, unspecified: Secondary | ICD-10-CM | POA: Diagnosis not present

## 2018-01-08 DIAGNOSIS — K219 Gastro-esophageal reflux disease without esophagitis: Secondary | ICD-10-CM | POA: Diagnosis not present

## 2018-01-08 DIAGNOSIS — Z0000 Encounter for general adult medical examination without abnormal findings: Secondary | ICD-10-CM

## 2018-01-08 DIAGNOSIS — I10 Essential (primary) hypertension: Secondary | ICD-10-CM | POA: Diagnosis not present

## 2018-01-08 DIAGNOSIS — E1165 Type 2 diabetes mellitus with hyperglycemia: Secondary | ICD-10-CM

## 2018-01-08 MED ORDER — MECLIZINE HCL 25 MG PO TABS: ORAL_TABLET | ORAL | 0 refills | Status: DC

## 2018-01-08 MED ORDER — EZETIMIBE 10 MG PO TABS: ORAL_TABLET | ORAL | 0 refills | Status: DC

## 2018-01-08 MED ORDER — AZELASTINE HCL 0.1 % NA SOLN: 1.0000 | Freq: Two times a day (BID) | NASAL | 5 refills | Status: DC | PRN

## 2018-01-08 MED ORDER — LOSARTAN POTASSIUM 50 MG PO TABS: 50.0000 mg | ORAL_TABLET | Freq: Every evening | ORAL | 1 refills | Status: DC

## 2018-01-08 MED ORDER — METFORMIN HCL 500 MG PO TABS: ORAL_TABLET | ORAL | 1 refills | Status: DC

## 2018-01-08 MED ORDER — HYOSCYAMINE SULFATE 0.125 MG SL SUBL: 0.1250 mg | SUBLINGUAL_TABLET | SUBLINGUAL | 0 refills | Status: DC | PRN

## 2018-01-08 MED ORDER — NATEGLINIDE 120 MG PO TABS: ORAL_TABLET | ORAL | 1 refills | Status: DC

## 2018-01-08 NOTE — Progress Notes (Signed)
Subjective:    Patient ID: Deborah Gray, female    DOB: Sep 07, 1933, 84 y.o.   MRN: [id number]  HPI AWV- Annual Wellness Visit  The patient was seen for their annual wellness visit. The patient's past medical history, surgical history, and family history were reviewed. Pertinent vaccines were reviewed ( tetanus, pneumonia, shingles, flu) The patient's medication list was reviewed and updated.  The height and weight were entered.  BMI recorded in electronic record elsewhere  Cognitive screening was completed. Outcome of Mini - Cog: pass   Falls /depression screening electronically recorded within record elsewhere  Current tobacco usage: none (All patients who use tobacco were given written and verbal information on quitting)  Recent listing of emergency department/hospitalizations over the past year were reviewed.  current specialist the patient sees on a regular basis: Dr. Altamese Dilling, Dr. Sydell Axon, Dr. Tonita Cong for back and hip problem   Medicare annual wellness visit patient questionnaire was reviewed.  A written screening schedule for the patient for the next 5-10 years was given. Appropriate discussion of followup regarding next visit was discussed.  Needs refill on meclizine, levsin  Results for orders placed or performed in visit on 12/30/17  Lipid panel  Result Value Ref Range   Cholesterol, Total 193 100 - 199 mg/dL   Triglycerides 115 0 - 149 mg/dL   HDL 59 >39 mg/dL   VLDL Cholesterol Cal 23 5 - 40 mg/dL   LDL Calculated 111 (H) 0 - 99 mg/dL   Chol/HDL Ratio 3.3 0.0 - 4.4 ratio  Hemoglobin A1c  Result Value Ref Range   Hgb A1c MFr Bld 6.8 (H) 4.8 - 5.6 %   Est. average glucose Bld gHb Est-mCnc 148 mg/dL     Review of Systems  Constitutional: Negative for activity change, appetite change and fatigue.  HENT: Negative for congestion and rhinorrhea.   Eyes: Negative for discharge.  Respiratory: Negative for cough, chest tightness and wheezing.     Cardiovascular: Negative for chest pain.  Gastrointestinal: Negative for abdominal pain, blood in stool and vomiting.  Endocrine: Negative for polyphagia.  Genitourinary: Negative for difficulty urinating and frequency.  Musculoskeletal: Negative for neck pain.  Skin: Negative for color change.  Allergic/Immunologic: Negative for environmental allergies and food allergies.  Neurological: Negative for weakness and headaches.  Psychiatric/Behavioral: Negative for agitation and behavioral problems.  All other systems reviewed and are negative.      Objective:   Physical Exam  Constitutional: She is oriented to person, place, and time. She appears well-developed and well-nourished.  HENT:  Head: Normocephalic.  Right Ear: External ear normal.  Left Ear: External ear normal.  Eyes: Pupils are equal, round, and reactive to light.  Neck: Normal range of motion. No thyromegaly present.  Cardiovascular: Normal rate, regular rhythm, normal heart sounds and intact distal pulses.  No murmur heard. Pulmonary/Chest: Effort normal and breath sounds normal. No respiratory distress. She has no wheezes.  Abdominal: Soft. Bowel sounds are normal. She exhibits no distension and no mass. There is no tenderness.  Musculoskeletal: Normal range of motion. She exhibits no edema or tenderness.  Lymphadenopathy:    She has no cervical adenopathy.  Neurological: She is alert and oriented to person, place, and time. She exhibits normal muscle tone.  Skin: Skin is warm and dry.  Psychiatric: She has a normal mood and affect. Her behavior is normal.  Vitals reviewed.         Assessment & Plan:  Impression wellness.  Up-to-date on vaccinations.  Stay on colonoscopy.  Diet discussed exercise discussed  #2 diabetes, numb up with reent steroid  Injection, ha lost a little weiight, A1c though still within normal limits for type 2 diabetes.  Discussed maintain same meds    #3 IBS a challenge levsin  helping some, patient would like refill we will press on   #4 Htn.  Good control discussed maintain same meds  5.  Hyperlipidemia.  Ongoing good control.  Diet discussed compliance discussed maintain same meds  Follow-up in 6 months.  Medications refilled.  Flu shot today.  Diet exercise discussed

## 2018-01-19 DIAGNOSIS — H34832 Tributary (branch) retinal vein occlusion, left eye, with macular edema: Secondary | ICD-10-CM | POA: Diagnosis not present

## 2018-01-20 ENCOUNTER — Other Ambulatory Visit (HOSPITAL_COMMUNITY)
Admission: RE | Admit: 2018-01-20 | Discharge: 2018-01-20 | Disposition: A | Discharge status: Home / Self Care | Payer: Medicare Other | Source: Other Acute Inpatient Hospital | Attending: Urology | Admitting: Urology

## 2018-01-20 ENCOUNTER — Telehealth: Payer: Self-pay | Admitting: Internal Medicine

## 2018-01-20 DIAGNOSIS — N3281 Overactive bladder: Secondary | ICD-10-CM | POA: Insufficient documentation

## 2018-01-20 NOTE — Telephone Encounter (Signed)
Lmom, waiting on a return call.  

## 2018-01-20 NOTE — Telephone Encounter (Signed)
[phone]  Please call patient, she wants a prescription of chlordizepoxide-clidinium  [facility]

## 2018-01-22 ENCOUNTER — Telehealth: Payer: Self-pay | Admitting: Internal Medicine

## 2018-01-22 ENCOUNTER — Encounter: Payer: Self-pay | Admitting: Internal Medicine

## 2018-01-22 LAB — URINE CULTURE

## 2018-01-22 NOTE — Telephone Encounter (Signed)
Pt was returning a call from AM. [phone]

## 2018-01-22 NOTE — Telephone Encounter (Signed)
I spoke to pt and she said the local [facility] has been having difficulty getting the Librax. She would like to get a written prescription and she would pick it up and mail it to mail order. I told her we could send it, but she prefers to pick up and mail it herself. I called Shawn at [facility]. He said pt picked up first prescription on 12/26/2017. I told him to cancel out the 5 refills since pt will be sending to mail order. Forwarding to [facility] refill to print prescription for pt to pick up.

## 2018-01-22 NOTE — Telephone Encounter (Signed)
See note from 01/20/2018.

## 2018-01-23 MED ORDER — CILIDINIUM-CHLORDIAZEPOXIDE 2.5-5 MG PO CAPS: 1.0000 | ORAL_CAPSULE | Freq: Three times a day (TID) | ORAL | 5 refills | Status: DC

## 2018-01-23 NOTE — Addendum Note (Signed)
Addended by: Annitta Needs on: 01/23/2018 08:45 AM   Modules accepted: Orders

## 2018-01-23 NOTE — Telephone Encounter (Signed)
RX is ready for pickup, pt is aware.

## 2018-01-23 NOTE — Telephone Encounter (Signed)
I printed prescription.  

## 2018-01-30 DIAGNOSIS — M2040 Other hammer toe(s) (acquired), unspecified foot: Secondary | ICD-10-CM | POA: Diagnosis not present

## 2018-01-30 DIAGNOSIS — L851 Acquired keratosis [keratoderma] palmaris et plantaris: Secondary | ICD-10-CM | POA: Diagnosis not present

## 2018-01-30 DIAGNOSIS — M76822 Posterior tibial tendinitis, left leg: Secondary | ICD-10-CM | POA: Diagnosis not present

## 2018-01-30 DIAGNOSIS — M76821 Posterior tibial tendinitis, right leg: Secondary | ICD-10-CM | POA: Diagnosis not present

## 2018-02-03 DIAGNOSIS — E119 Type 2 diabetes mellitus without complications: Secondary | ICD-10-CM | POA: Diagnosis not present

## 2018-02-04 ENCOUNTER — Ambulatory Visit (INDEPENDENT_AMBULATORY_CARE_PROVIDER_SITE_OTHER): Payer: Medicare Other | Admitting: Urology

## 2018-02-04 DIAGNOSIS — N3020 Other chronic cystitis without hematuria: Secondary | ICD-10-CM

## 2018-02-24 ENCOUNTER — Telehealth: Payer: Self-pay | Admitting: Family Medicine

## 2018-02-24 ENCOUNTER — Ambulatory Visit: Payer: Medicare Other | Admitting: Internal Medicine

## 2018-02-24 NOTE — Telephone Encounter (Signed)
Patient dropped off a handicapp parking placard form to be completed.  Form place in box.

## 2018-02-24 NOTE — Telephone Encounter (Signed)
Please advise. Thank you

## 2018-02-24 NOTE — Telephone Encounter (Signed)
done

## 2018-03-03 ENCOUNTER — Encounter: Payer: Self-pay | Admitting: Internal Medicine

## 2018-03-03 ENCOUNTER — Ambulatory Visit: Payer: Medicare Other | Admitting: Internal Medicine

## 2018-03-03 ENCOUNTER — Ambulatory Visit (INDEPENDENT_AMBULATORY_CARE_PROVIDER_SITE_OTHER): Payer: Medicare Other | Admitting: Internal Medicine

## 2018-03-03 VITALS — BP 138/63 | HR 95 | Temp 97.0°F | Ht 64.0 in | Wt 149.2 lb

## 2018-03-03 DIAGNOSIS — K8689 Other specified diseases of pancreas: Secondary | ICD-10-CM | POA: Diagnosis not present

## 2018-03-03 DIAGNOSIS — K219 Gastro-esophageal reflux disease without esophagitis: Secondary | ICD-10-CM

## 2018-03-03 DIAGNOSIS — Z8719 Personal history of other diseases of the digestive system: Secondary | ICD-10-CM

## 2018-03-03 NOTE — Patient Instructions (Signed)
Continue Omeprazole daily  Continue pancreatic enzymes  Use librax as needed  Continue Benefiber - one tablespoon - each morning  Office visit here in 4 months

## 2018-03-04 ENCOUNTER — Ambulatory Visit (INDEPENDENT_AMBULATORY_CARE_PROVIDER_SITE_OTHER): Payer: Medicare Other | Admitting: Urology

## 2018-03-04 DIAGNOSIS — N3010 Interstitial cystitis (chronic) without hematuria: Secondary | ICD-10-CM

## 2018-03-04 NOTE — Progress Notes (Signed)
Primary Care Physician:  [name], MD Primary Gastroenterologist:  Dr. Gala Romney  Pre-Procedure History & Physical: HPI:  Deborah Gray is a 85 y.o. female here for GERD, pancreatic exocrine deficiency and mixed IBS.  Reflux good on omeprazole 40 mg daily.  Cont on enzyme therapy.  Uses librax prn with good results.  On daily Benefiber.  Past Medical History:  Diagnosis Date  . Arthritis   . Benign neoplasm of colon   . Constipation   . Diaphragmatic hernia without mention of obstruction or gangrene   . Diverticulosis of colon (without mention of hemorrhage)   . Dysphagia, pharyngoesophageal phase   . Esophageal reflux   . Essential hypertension   . Heart murmur   . Hemorrhoids   . Hyperlipidemia   . Internal hemorrhoids without mention of complication   . PONV (postoperative nausea and vomiting)   . Stricture and stenosis of esophagus   . Type 2 diabetes mellitus ([place])   . Unspecified gastritis and gastroduodenitis without mention of hemorrhage     Past Surgical History:  Procedure Laterality Date  . ABDOMINAL HYSTERECTOMY    . BACTERIAL OVERGROWTH TEST N/A 09/06/2015   Procedure: BACTERIAL OVERGROWTH TEST;  Surgeon: Daneil Dolin, MD;  Location: [facility];  Service: Endoscopy;  Laterality: N/A;  800  . BREAST LUMPECTOMY Right    benign  . CATARACT EXTRACTION Bilateral 2006   Implants in both, surgeries done 6 weeks apart  . CHOLECYSTECTOMY    . COLONOSCOPY  03/08/02   [name]: diverticulosis, internal hemorrhoids, adenomatous colon polyp  . COLONOSCOPY  01/23/04   [name]: diverticulosis, internal hemorrhoids  . COLONOSCOPY  09/25/05   [name]: diverticulosis  . COLONOSCOPY  07/05/09   [name]: severe diverticulosis  . COLONOSCOPY  01/21/11   [name]: severe diverticulosis in sigmoid to desc colon, int hemorrhoids, follow up TCS in 5 years  . COLONOSCOPY N/A 04/10/2016   Procedure: COLONOSCOPY;  Surgeon: Daneil Dolin, MD;  Location: [facility];  Service: Endoscopy;  Laterality: N/A;  12:30 PM  . DILATION AND CURETTAGE OF UTERUS    . ESOPHAGEAL MANOMETRY  03/21/08   Patterson: findings c/w [place] Esophagus  . ESOPHAGOGASTRODUODENOSCOPY  03/08/02   [name]: esophageal stricture, chronic gerd, s/p dilation.   . ESOPHAGOGASTRODUODENOSCOPY  01/23/04   [name]: esophageal stricture, gastritis, hiatal hernia  . ESOPHAGOGASTRODUODENOSCOPY  09/25/05   Patterson: gastritis, benign bx, no H.pylori  . ESOPHAGOGASTRODUODENOSCOPY  07/05/09   Patterson: gastropathy, benign small bowel bx and gastric bx  . ESOPHAGOGASTRODUODENOSCOPY N/A 05/15/2015   Dr. Gala Romney- diffuse moderate inflammation characterized by congestion (edema), erythema, and linear erosions was found in the entire examined stomach. bx= reactive gastropathy  . FOOT SURGERY Left   . HEMORRHOID BANDING  2017   Dr.Jalissa Heinzelman  . LAPAROSCOPIC VAGINAL HYSTERECTOMY    . RECTOCELE REPAIR    . TOTAL KNEE ARTHROPLASTY Right     Prior to Admission medications   Medication Sig Start Date End Date Taking? Authorizing Provider  acetaminophen (TYLENOL) 500 MG tablet Take 500 mg at bedtime by mouth.    Yes [provider]  aspirin EC 81 MG tablet Take 81 mg by mouth at bedtime.   Yes [provider]  azelastine (ASTELIN) 0.1 % nasal spray Place 1 spray into both nostrils 2 (two) times daily as needed for rhinitis. 01/08/18  Yes [name], MD  Carboxymethylcellulose Sodium (THERATEARS) 0.25 % SOLN Apply 1 drop 2 (two) times daily to eye.  Yes [provider]  clidinium-chlordiazePOXIDE (LIBRAX) 5-2.5 MG capsule Take 1 capsule by mouth 4 (four) times daily -  before meals and at bedtime. As needed for abdominal cramps and diarrhea Patient taking differently: Take 1 capsule by mouth as needed. As needed for abdominal cramps and diarrhea 01/23/18  Yes Annitta Needs, NP  ezetimibe (ZETIA) 10 MG tablet TAKE 1 TABLET BY MOUTH EVERY DAY Needs office visit. 01/08/18   Yes [name], MD  guaiFENesin (MUCINEX) 600 MG 12 hr tablet Take 600 mg by mouth 2 (two) times daily as needed for cough or to loosen phlegm.    Yes [provider]  imipramine (TOFRANIL) 10 MG tablet Take 1 tablet (10 mg total) by mouth at bedtime. Patient taking differently: Take 10 mg by mouth 2 (two) times daily.  09/30/16  Yes Mahala Menghini, PA-C  isometheptene-acetaminophen-dichloralphenazone (MIDRIN) [phone] MG capsule TAKE ONE CAPSULE BY MOUTH 4 TIMES A DAY AS NEEDED FOR HEADACHE 05/20/16  Yes [name], MD  losartan (COZAAR) 50 MG tablet Take 1 tablet (50 mg total) by mouth every evening. 01/08/18  Yes [name], MD  meclizine (ANTIVERT) 25 MG tablet TAKE 1 TABLET (25 MG TOTAL) BY MOUTH 3 (THREE) TIMES DAILY AS NEEDED FOR DIZZINESS OR NAUSEA. 01/08/18  Yes [name], MD  metFORMIN (GLUCOPHAGE) 500 MG tablet Take one qam, one at lunch and 2 qhs 01/08/18  Yes [name], MD  methenamine (HIPREX) 1 g tablet Take 1 g by mouth 2 (two) times daily. 10/08/17  Yes [provider]  mirabegron ER (MYRBETRIQ) 25 MG TB24 tablet Take 50 mg every morning by mouth.    Yes [provider]  Multiple Vitamins-Minerals (PRESERVISION AREDS 2) CAPS Take 1 capsule by mouth 2 (two) times daily.    Yes [provider]  nateglinide (STARLIX) 120 MG tablet TAKE 1 TABLET BY MOUTH 3 TIMES A DAY BEFORE MEALS 01/08/18  Yes [name], MD  omega-3 acid ethyl esters (LOVAZA) 1 g capsule Take 4 g by mouth daily.   Yes [provider]  omeprazole (PRILOSEC) 40 MG capsule Take 1 capsule (40 mg total) by mouth daily. 12/16/17  Yes Nechemia Chiappetta, Cristopher Estimable, MD  Pancrelipase, Lip-Prot-Amyl, (CREON) 24000-76000 units CPEP TAKE 3 CAPSULES THREE TIMES DAILY WITH MEALS AND ONE CAPSULE WITH SNACK TWICE DAILY 10/24/17  Yes Carlis Stable, NP  vitamin E 400 UNIT capsule Take 400 Units by mouth 4 (four) times a week. Pt takes on Tuesday, Thursday, Saturday,  and Sunday.   Yes [provider]  Vitamin Mixture (ESTER-C) 500-60 MG TABS Take 1 tablet by mouth 4 (four) times a week. Pt takes on Sunday, Monday, Wednesday, and Friday.   Yes [provider]  Wheat Dextrin (BENEFIBER DRINK MIX) PACK Take by mouth daily.   Yes [provider]  dibucaine (NUPERCAINAL) 1 % ointment Apply topically as needed for pain.    [provider]  hyoscyamine (LEVSIN SL) 0.125 MG SL tablet Place 0.125 mg under the tongue as needed.    [provider]  hyoscyamine (LEVSIN SL) 0.125 MG SL tablet Place 1 tablet (0.125 mg total) under the tongue every 4 (four) hours as needed. Patient not taking: Reported on 03/03/2018 01/08/18   [name], MD    Allergies as of 03/03/2018 - Review Complete 03/03/2018  Allergen Reaction Noted  . Adhesive [tape] Other (See Comments) 07/21/2016  . Estrogens Other (See Comments) 10/15/2012  .  Sulfonamide derivatives Rash 03/01/2008    Family History  Problem Relation Age of Onset  . Ovarian cancer Mother   . Diabetes Father   . Heart disease Father   . Breast cancer Sister   . Liver cancer Sister   . Colon cancer Cousin        Paternal side  . Diabetes Maternal Aunt   . Liver disease Cousin        Maternal side, never drank    Social History   Socioeconomic History  . Marital status: Married    Spouse name: Not on file  . Number of children: 3  . Years of education: Not on file  . Highest education level: Not on file  Occupational History  . Occupation: Retired  Scientific laboratory technician  . Financial resource strain: Not on file  . Food insecurity:    Worry: Not on file    Inability: Not on file  . Transportation needs:    Medical: Not on file    Non-medical: Not on file  Tobacco Use  . Smoking status: Never Smoker  . Smokeless tobacco: Never Used  . Tobacco comment: Never smoked  Substance and Sexual Activity  . Alcohol use: No    Alcohol/week: 0.0 standard drinks  . Drug  use: No  . Sexual activity: Not on file  Lifestyle  . Physical activity:    Days per week: Not on file    Minutes per session: Not on file  . Stress: Not on file  Relationships  . Social connections:    Talks on phone: Not on file    Gets together: Not on file    Attends religious service: Not on file    Active member of club or organization: Not on file    Attends meetings of clubs or organizations: Not on file    Relationship status: Not on file  . Intimate partner violence:    Fear of current or ex partner: Not on file    Emotionally abused: Not on file    Physically abused: Not on file    Forced sexual activity: Not on file  Other Topics Concern  . Not on file  Social History Narrative  . Not on file    Review of Systems: See HPI, otherwise negative ROS  Physical Exam: BP 138/63   Pulse 95   Temp (!) 97 F (36.1 C) (Oral)   Ht 5\' 4"  (1.626 m)   Wt 149 lb 3.2 oz (67.7 kg)   BMI 25.61 kg/m  General:   Alert,  Well-developed, well-nourished, pleasant and cooperative in NAD Skin:  Intact without significant lesions or rashes. Eyes:  Sclera clear, no icterus.   Conjunctiva pink. Ears:  Normal auditory acuity. Nose:  No deformity, discharge,  or lesions. Mouth:  No deformity or lesions. Neck:  Supple; no masses or thyromegaly. No significant cervical adenopathy. Lungs:  Clear throughout to auscultation.   No wheezes, crackles, or rhonchi. No acute distress. Heart:  Regular rate and rhythm; no murmurs, clicks, rubs,  or gallops. Abdomen: Non-distended, normal bowel sounds.  Soft and nontender without appreciable mass or hepatosplenomegaly.  Pulses:  Normal pulses noted. Extremities:  Without clubbing or edema.  Impression/Plan:  All in all, pt doing well with GERD and IBS;  Pancreatic insufficiency sx quiescent on enzyme supplements and PPI.  Recommendations:   Continue Omeprazole daily  Continue pancreatic enzymes  Use librax as needed  Continue Benefiber -  one tablespoon - each morning  Office  visit here in 4 months     Notice: This dictation was prepared with Dragon dictation along with smaller phrase technology. Any transcriptional errors that result from this process are unintentional and may not be corrected upon review.

## 2018-03-09 DIAGNOSIS — H34832 Tributary (branch) retinal vein occlusion, left eye, with macular edema: Secondary | ICD-10-CM | POA: Diagnosis not present

## 2018-03-10 ENCOUNTER — Ambulatory Visit (INDEPENDENT_AMBULATORY_CARE_PROVIDER_SITE_OTHER): Payer: Medicare Other | Admitting: Family Medicine

## 2018-03-10 ENCOUNTER — Encounter: Payer: Self-pay | Admitting: Family Medicine

## 2018-03-10 VITALS — BP 134/80 | Temp 97.6°F | Ht 64.0 in | Wt 151.6 lb

## 2018-03-10 DIAGNOSIS — M79676 Pain in unspecified toe(s): Secondary | ICD-10-CM | POA: Diagnosis not present

## 2018-03-10 DIAGNOSIS — R05 Cough: Secondary | ICD-10-CM | POA: Diagnosis not present

## 2018-03-10 DIAGNOSIS — S91109A Unspecified open wound of unspecified toe(s) without damage to nail, initial encounter: Secondary | ICD-10-CM

## 2018-03-10 DIAGNOSIS — J019 Acute sinusitis, unspecified: Secondary | ICD-10-CM

## 2018-03-10 MED ORDER — AMOXICILLIN 500 MG PO TABS: 500.0000 mg | ORAL_TABLET | Freq: Three times a day (TID) | ORAL | 0 refills | Status: DC

## 2018-03-10 MED ORDER — MUPIROCIN 2 % EX OINT: TOPICAL_OINTMENT | CUTANEOUS | 0 refills | Status: DC

## 2018-03-10 NOTE — Progress Notes (Signed)
   Subjective:    Patient ID: Deborah Gray, female    DOB: Mar 03, 1933, 85 y.o.   MRN: [id number]  Sinusitis  This is a new problem. The current episode started 1 to 4 weeks ago. Associated symptoms include congestion and coughing. Pertinent negatives include no ear pain or shortness of breath. Treatments tried: mucinex 600mg .  Patient relates this been going on for the past couple weeks head congestion drainage coughing denies high fever chills wheezing difficulty breathing  Sore toe that is not healing for 2 -3 weeks. Hit toe on heater.  Unfortunately it scraped the surface of the skin off her toe and because she does have some mild deformity to the toe it puts pressure on that area therefore it is having a difficult time healing   Review of Systems  Constitutional: Negative for activity change and fever.  HENT: Positive for congestion and rhinorrhea. Negative for ear pain.   Eyes: Negative for discharge.  Respiratory: Positive for cough. Negative for shortness of breath and wheezing.   Cardiovascular: Negative for chest pain.       Objective:   Physical Exam Vitals signs and nursing note reviewed.  Constitutional:      Appearance: She is well-developed.  HENT:     Head: Normocephalic.     Nose: Nose normal.     Mouth/Throat:     Pharynx: No oropharyngeal exudate.  Neck:     Musculoskeletal: Neck supple.  Cardiovascular:     Rate and Rhythm: Normal rate.     Heart sounds: Normal heart sounds. No murmur.  Pulmonary:     Effort: Pulmonary effort is normal.     Breath sounds: Normal breath sounds. No wheezing.  Lymphadenopathy:     Cervical: No cervical adenopathy.  Skin:    General: Skin is warm and dry.     The toe has a excoriated area but seems to be healing in slowly from secondary intent      Assessment & Plan:  Toe wound- use cushion on the toe I would recommend moleskin that is in the doughnut ring form Bactroban ointment applied daily over the next 2  weeks If not healing up over the next few weeks next step would be referral to wound management center  Acute rhinosinusitis antibiotics prescribed warning signs discussed follow-up if ongoing troubles

## 2018-03-19 ENCOUNTER — Ambulatory Visit (INDEPENDENT_AMBULATORY_CARE_PROVIDER_SITE_OTHER): Payer: Medicare Other | Admitting: Family Medicine

## 2018-03-19 ENCOUNTER — Encounter: Payer: Self-pay | Admitting: Family Medicine

## 2018-03-19 VITALS — BP 124/84 | Temp 97.7°F | Ht 64.0 in | Wt 149.0 lb

## 2018-03-19 DIAGNOSIS — J019 Acute sinusitis, unspecified: Secondary | ICD-10-CM | POA: Diagnosis not present

## 2018-03-19 MED ORDER — CEFDINIR 300 MG PO CAPS: 300.0000 mg | ORAL_CAPSULE | Freq: Two times a day (BID) | ORAL | 0 refills | Status: DC

## 2018-03-19 NOTE — Progress Notes (Signed)
   Subjective:    Patient ID: Deborah Gray, female    DOB: 14-Jan-1934, 85 y.o.   MRN: [id number]  Otalgia   There is pain in the right ear. This is a new problem. The current episode started in the past 7 days. Associated symptoms include rhinorrhea. Associated symptoms comments: Thick congestion. Treatments tried: amoxil.   Right ear was shooting pains last night and kept her from sleeping last night. Having drnae in the throat   gren disch some blood in it   occas gets some up  And brings I t up , often gunky    amox one day left  fro the treatment started 9 days ago   600 mucinex bid for the past six weeks, but not going away       Review of Systems  HENT: Positive for ear pain and rhinorrhea.        Objective:   Physical Exam  Alert, mild malaise. Hydration good Vitals stable. frontal/ maxillary tenderness evident positive nasal congestion. pharynx normal neck supple  lungs clear/no crackles or wheezes. heart regular in rhythm Patient has some tenderness right posterior cervical region to deep palpation      Assessment & Plan:  Impression rhinosinusitis likely post viral, discussed with patient. plan antibiotics prescribed. Questions answered. Symptomatic care discussed. warning signs discussed. WSL Persistent in nature.  Also accompanied by musculoskeletal symptoms in the neck.  Symptom care discussed.  Antibiotics prescribed

## 2018-03-22 ENCOUNTER — Other Ambulatory Visit: Payer: Self-pay | Admitting: Gastroenterology

## 2018-03-23 NOTE — Telephone Encounter (Signed)
Is patient taking one tablet at bedtime or 1 tablet BID?

## 2018-03-23 NOTE — Telephone Encounter (Signed)
Lmom, waiting on a return call.  

## 2018-03-24 DIAGNOSIS — E119 Type 2 diabetes mellitus without complications: Secondary | ICD-10-CM | POA: Diagnosis not present

## 2018-04-05 ENCOUNTER — Other Ambulatory Visit: Payer: Self-pay | Admitting: Family Medicine

## 2018-04-06 DIAGNOSIS — M5136 Other intervertebral disc degeneration, lumbar region: Secondary | ICD-10-CM | POA: Diagnosis not present

## 2018-04-06 DIAGNOSIS — M1712 Unilateral primary osteoarthritis, left knee: Secondary | ICD-10-CM | POA: Diagnosis not present

## 2018-04-06 DIAGNOSIS — M25562 Pain in left knee: Secondary | ICD-10-CM | POA: Diagnosis not present

## 2018-04-06 DIAGNOSIS — M48061 Spinal stenosis, lumbar region without neurogenic claudication: Secondary | ICD-10-CM | POA: Diagnosis not present

## 2018-04-08 ENCOUNTER — Ambulatory Visit (INDEPENDENT_AMBULATORY_CARE_PROVIDER_SITE_OTHER): Payer: Medicare Other | Admitting: Urology

## 2018-04-08 DIAGNOSIS — N3020 Other chronic cystitis without hematuria: Secondary | ICD-10-CM

## 2018-04-10 DIAGNOSIS — M76822 Posterior tibial tendinitis, left leg: Secondary | ICD-10-CM | POA: Diagnosis not present

## 2018-04-10 DIAGNOSIS — M2040 Other hammer toe(s) (acquired), unspecified foot: Secondary | ICD-10-CM | POA: Diagnosis not present

## 2018-04-10 DIAGNOSIS — M76821 Posterior tibial tendinitis, right leg: Secondary | ICD-10-CM | POA: Diagnosis not present

## 2018-04-10 DIAGNOSIS — E1142 Type 2 diabetes mellitus with diabetic polyneuropathy: Secondary | ICD-10-CM | POA: Diagnosis not present

## 2018-04-20 NOTE — Telephone Encounter (Signed)
Pt is taking medication once at bedtime. Pt said she doesn't need a refill at this time. She stated that she has several pills and a refill isn't needed at this time.

## 2018-04-27 DIAGNOSIS — H34832 Tributary (branch) retinal vein occlusion, left eye, with macular edema: Secondary | ICD-10-CM | POA: Diagnosis not present

## 2018-04-27 DIAGNOSIS — H353132 Nonexudative age-related macular degeneration, bilateral, intermediate dry stage: Secondary | ICD-10-CM | POA: Diagnosis not present

## 2018-04-30 ENCOUNTER — Other Ambulatory Visit: Payer: Self-pay | Admitting: Family Medicine

## 2018-04-30 NOTE — Telephone Encounter (Signed)
Ok plus five ref 

## 2018-05-06 ENCOUNTER — Ambulatory Visit (INDEPENDENT_AMBULATORY_CARE_PROVIDER_SITE_OTHER): Payer: Medicare Other | Admitting: Urology

## 2018-05-06 DIAGNOSIS — N3020 Other chronic cystitis without hematuria: Secondary | ICD-10-CM

## 2018-05-10 ENCOUNTER — Other Ambulatory Visit: Payer: Self-pay | Admitting: Family Medicine

## 2018-05-14 DIAGNOSIS — L219 Seborrheic dermatitis, unspecified: Secondary | ICD-10-CM | POA: Diagnosis not present

## 2018-05-14 DIAGNOSIS — D229 Melanocytic nevi, unspecified: Secondary | ICD-10-CM | POA: Diagnosis not present

## 2018-05-14 DIAGNOSIS — L570 Actinic keratosis: Secondary | ICD-10-CM | POA: Diagnosis not present

## 2018-06-03 ENCOUNTER — Ambulatory Visit (INDEPENDENT_AMBULATORY_CARE_PROVIDER_SITE_OTHER): Payer: Medicare Other | Admitting: Urology

## 2018-06-03 DIAGNOSIS — N3010 Interstitial cystitis (chronic) without hematuria: Secondary | ICD-10-CM | POA: Diagnosis not present

## 2018-06-15 ENCOUNTER — Emergency Department (HOSPITAL_COMMUNITY)
Admission: EM | Admit: 2018-06-15 | Discharge: 2018-06-15 | Disposition: A | Discharge status: Home / Self Care | Payer: Medicare Other | Attending: Emergency Medicine | Admitting: Emergency Medicine

## 2018-06-15 ENCOUNTER — Emergency Department (HOSPITAL_COMMUNITY): Payer: Medicare Other

## 2018-06-15 ENCOUNTER — Other Ambulatory Visit: Payer: Self-pay

## 2018-06-15 ENCOUNTER — Encounter (HOSPITAL_COMMUNITY): Payer: Self-pay | Admitting: *Deleted

## 2018-06-15 DIAGNOSIS — R52 Pain, unspecified: Secondary | ICD-10-CM | POA: Diagnosis not present

## 2018-06-15 DIAGNOSIS — Y9389 Activity, other specified: Secondary | ICD-10-CM | POA: Diagnosis not present

## 2018-06-15 DIAGNOSIS — E785 Hyperlipidemia, unspecified: Secondary | ICD-10-CM | POA: Insufficient documentation

## 2018-06-15 DIAGNOSIS — S52592D Other fractures of lower end of left radius, subsequent encounter for closed fracture with routine healing: Secondary | ICD-10-CM | POA: Diagnosis not present

## 2018-06-15 DIAGNOSIS — W228XXA Striking against or struck by other objects, initial encounter: Secondary | ICD-10-CM | POA: Diagnosis not present

## 2018-06-15 DIAGNOSIS — S3993XA Unspecified injury of pelvis, initial encounter: Secondary | ICD-10-CM | POA: Diagnosis not present

## 2018-06-15 DIAGNOSIS — Y998 Other external cause status: Secondary | ICD-10-CM | POA: Diagnosis not present

## 2018-06-15 DIAGNOSIS — Y92009 Unspecified place in unspecified non-institutional (private) residence as the place of occurrence of the external cause: Secondary | ICD-10-CM | POA: Diagnosis not present

## 2018-06-15 DIAGNOSIS — S52612D Displaced fracture of left ulna styloid process, subsequent encounter for closed fracture with routine healing: Secondary | ICD-10-CM | POA: Diagnosis not present

## 2018-06-15 DIAGNOSIS — Z79899 Other long term (current) drug therapy: Secondary | ICD-10-CM | POA: Diagnosis not present

## 2018-06-15 DIAGNOSIS — S52502A Unspecified fracture of the lower end of left radius, initial encounter for closed fracture: Secondary | ICD-10-CM | POA: Insufficient documentation

## 2018-06-15 DIAGNOSIS — S52572A Other intraarticular fracture of lower end of left radius, initial encounter for closed fracture: Secondary | ICD-10-CM | POA: Diagnosis not present

## 2018-06-15 DIAGNOSIS — W19XXXA Unspecified fall, initial encounter: Secondary | ICD-10-CM | POA: Diagnosis not present

## 2018-06-15 DIAGNOSIS — S52615A Nondisplaced fracture of left ulna styloid process, initial encounter for closed fracture: Secondary | ICD-10-CM | POA: Diagnosis not present

## 2018-06-15 DIAGNOSIS — S6992XA Unspecified injury of left wrist, hand and finger(s), initial encounter: Secondary | ICD-10-CM | POA: Diagnosis present

## 2018-06-15 MED ORDER — HYDROCODONE-ACETAMINOPHEN 5-325 MG PO TABS: 1.0000 | ORAL_TABLET | ORAL | 0 refills | Status: DC | PRN

## 2018-06-15 MED ORDER — ONDANSETRON HCL 4 MG/2ML IJ SOLN
4.0000 mg | Freq: Once | INTRAMUSCULAR | Status: AC
  Administered 2018-06-15: 12:00:00 4 mg via INTRAVENOUS
  Filled 2018-06-15: qty 2

## 2018-06-15 MED ORDER — ONDANSETRON 4 MG PO TBDP: 4.0000 mg | ORAL_TABLET | Freq: Three times a day (TID) | ORAL | 0 refills | Status: DC | PRN

## 2018-06-15 MED ORDER — FENTANYL CITRATE (PF) 100 MCG/2ML IJ SOLN
25.0000 ug | Freq: Once | INTRAMUSCULAR | Status: AC
  Administered 2018-06-15: 25 ug via INTRAVENOUS
  Filled 2018-06-15: qty 2

## 2018-06-15 MED ORDER — PROPOFOL 10 MG/ML IV BOLUS
0.5000 mg/kg | Freq: Once | INTRAVENOUS | Status: AC
  Administered 2018-06-15: 33.6 mg via INTRAVENOUS
  Filled 2018-06-15: qty 20

## 2018-06-15 NOTE — Sedation Documentation (Signed)
Additional propofol given as follows per verbal orders by J. Haviland, MD 10 mg at 1418 20mg  at 1420 20mg  at  1423

## 2018-06-15 NOTE — ED Triage Notes (Signed)
Pt pushing a grandfather clock and the clock fell on top of pt.  Reported that left waist is deformed.

## 2018-06-15 NOTE — ED Provider Notes (Signed)
[facility] EMERGENCY DEPARTMENT Provider Note   CSN: [id number] Arrival date & time: 06/15/18  1143    History   Chief Complaint Chief Complaint  Patient presents with  . Arm Injury    HPI Deborah Gray is a 85 y.o. female.     Pt presents to the ED today with left wrist pain after a fall.  She was trying to push a grandfather clock and it fell on her.  She c/o left wrist pain and left hip pain.   She did not hit her head or have a loc.     Past Medical History:  Diagnosis Date  . Arthritis   . Benign neoplasm of colon   . Constipation   . Diaphragmatic hernia without mention of obstruction or gangrene   . Diverticulosis of colon (without mention of hemorrhage)   . Dysphagia, pharyngoesophageal phase   . Esophageal reflux   . Essential hypertension   . Heart murmur   . Hemorrhoids   . Hyperlipidemia   . Internal hemorrhoids without mention of complication   . PONV (postoperative nausea and vomiting)   . Stricture and stenosis of esophagus   . Type 2 diabetes mellitus ([place])   . Unspecified gastritis and gastroduodenitis without mention of hemorrhage     Patient Active Problem List   Diagnosis Date Noted  . Bloating 08/30/2015  . Exertional chest pain 08/16/2015  . Chest pain 08/16/2015  . Mucosal abnormality of stomach   . Hemorrhoids 04/18/2015  . IBS (irritable bowel syndrome) 01/30/2015  . Type 2 diabetes mellitus ([place]) 12/22/2014  . Hyperlipidemia 12/22/2014  . Prolapse of female pelvic organs 11/28/2013  . Precordial pain 11/25/2013  . Essential hypertension 11/25/2013  . Heart murmur 11/25/2013  . Osteopenia 10/23/2012  . Chronic pancreatitis ([place]) 02/26/2011  . GERD (gastroesophageal reflux disease) 01/18/2011  . Constipation 11/29/2010  . Esophageal spasm 11/29/2010  . DJD (degenerative joint disease) 11/29/2010  . Hx of adenomatous colonic polyps 06/29/2009  . ESOPHAGEAL MOTILITY DISORDER 04/01/2008  . DM 03/01/2008  . HEMORRHOIDS,  INTERNAL 07/02/2007  . Diverticulosis of large intestine 07/02/2007    Past Surgical History:  Procedure Laterality Date  . ABDOMINAL HYSTERECTOMY    . BACTERIAL OVERGROWTH TEST N/A 09/06/2015   Procedure: BACTERIAL OVERGROWTH TEST;  Surgeon: Daneil Dolin, MD;  Location: [facility];  Service: Endoscopy;  Laterality: N/A;  800  . BREAST LUMPECTOMY Right    benign  . CATARACT EXTRACTION Bilateral 2006   Implants in both, surgeries done 6 weeks apart  . CHOLECYSTECTOMY    . COLONOSCOPY  03/08/02   [name]: diverticulosis, internal hemorrhoids, adenomatous colon polyp  . COLONOSCOPY  01/23/04   [name]: diverticulosis, internal hemorrhoids  . COLONOSCOPY  09/25/05   [name]: diverticulosis  . COLONOSCOPY  07/05/09   [name]: severe diverticulosis  . COLONOSCOPY  01/21/11   [name]: severe diverticulosis in sigmoid to desc colon, int hemorrhoids, follow up TCS in 5 years  . COLONOSCOPY N/A 04/10/2016   Procedure: COLONOSCOPY;  Surgeon: Daneil Dolin, MD;  Location: [facility]O SUITE;  Service: Endoscopy;  Laterality: N/A;  12:30 PM  . DILATION AND CURETTAGE OF UTERUS    . ESOPHAGEAL MANOMETRY  03/21/08   Patterson: findings c/w [place] Esophagus  . ESOPHAGOGASTRODUODENOSCOPY  03/08/02   [name]: esophageal stricture, chronic gerd, s/p dilation.   . ESOPHAGOGASTRODUODENOSCOPY  01/23/04   [name]: esophageal stricture, gastritis, hiatal hernia  . ESOPHAGOGASTRODUODENOSCOPY  09/25/05   Patterson: gastritis, benign bx, no H.pylori  .  ESOPHAGOGASTRODUODENOSCOPY  07/05/09   Patterson: gastropathy, benign small bowel bx and gastric bx  . ESOPHAGOGASTRODUODENOSCOPY N/A 05/15/2015   Dr. Gala Romney- diffuse moderate inflammation characterized by congestion (edema), erythema, and linear erosions was found in the entire examined stomach. bx= reactive gastropathy  . FOOT SURGERY Left   . HEMORRHOID BANDING  2017   Dr.[name]  . LAPAROSCOPIC VAGINAL HYSTERECTOMY    . RECTOCELE REPAIR    . TOTAL  KNEE ARTHROPLASTY Right      OB History    Gravida  4   Para  3   Term  3   Preterm      AB  1   Living  3     SAB  1   TAB      Ectopic      Multiple      Live Births               Home Medications    Prior to Admission medications   Medication Sig Start Date End Date Taking? Authorizing Provider  acetaminophen (TYLENOL) 500 MG tablet Take 500 mg at bedtime by mouth.    Yes [provider]  aspirin EC 81 MG tablet Take 81 mg by mouth at bedtime.   Yes [provider]  azelastine (ASTELIN) 0.1 % nasal spray Place 1 spray into both nostrils 2 (two) times daily as needed for rhinitis. 01/08/18  Yes [name], MD  Carboxymethylcellulose Sodium (THERATEARS) 0.25 % SOLN Apply 1 drop 2 (two) times daily to eye.   Yes [provider]  clidinium-chlordiazePOXIDE (LIBRAX) 5-2.5 MG capsule Take 1 capsule by mouth 4 (four) times daily -  before meals and at bedtime. As needed for abdominal cramps and diarrhea Patient taking differently: Take 1 capsule by mouth as needed. As needed for abdominal cramps and diarrhea 01/23/18  Yes Annitta Needs, NP  clobetasol (TEMOVATE) 0.05 % external solution Apply 1 application topically daily as needed. 05/15/18  Yes [provider]  ezetimibe (ZETIA) 10 MG tablet TAKE 1 TABLET BY MOUTH EVERY DAY NEEDS OFFICE VISIT. 04/06/18  Yes [name], MD  guaiFENesin (MUCINEX) 600 MG 12 hr tablet Take 600 mg by mouth 2 (two) times daily as needed for cough or to loosen phlegm.    Yes [provider]  hydrocortisone cream 1 % Apply 1 application topically daily as needed for itching.   Yes [provider]  hyoscyamine (LEVSIN SL) 0.125 MG SL tablet Place 1 tablet (0.125 mg total) under the tongue every 4 (four) hours as needed. 01/08/18  Yes [name], MD  imipramine (TOFRANIL) 10 MG tablet TAKE 1 TABLET BY MOUTH EVERYDAY AT BEDTIME 04/02/18  Yes Mahala Menghini, PA-C  losartan  (COZAAR) 50 MG tablet Take 1 tablet (50 mg total) by mouth every evening. 01/08/18  Yes [name], MD  meclizine (ANTIVERT) 25 MG tablet TAKE 1 TABLET (25 MG TOTAL) BY MOUTH 3 (THREE) TIMES DAILY AS NEEDED FOR DIZZINESS OR NAUSEA. 01/08/18  Yes [name], MD  metFORMIN (GLUCOPHAGE) 500 MG tablet TAKE ONE TABLET BY MOUTH EVERY MORNING ONE AT LUNCH AND 2 AT BEDTIME Patient taking differently: Take 250-1,000 mg by mouth 3 (three) times daily. Patient takes 1 tablet by mouth in the morning, 1/2 of a tablet by mouth at lunch, and 2 tablets by mouth at bedtime 05/11/18  Yes Luking, Grace Bushy, MD  methenamine (HIPREX) 1 g tablet Take 1 g by mouth 2 (two)  times daily. 10/08/17  Yes [provider]  mirabegron ER (MYRBETRIQ) 25 MG TB24 tablet Take 50 mg every morning by mouth.    Yes [provider]  Multiple Vitamins-Minerals (PRESERVISION AREDS 2) CAPS Take 1 capsule by mouth 2 (two) times daily.    Yes [provider]  nateglinide (STARLIX) 120 MG tablet TAKE 1 TABLET BY MOUTH 3 TIMES A DAY BEFORE MEALS 01/08/18  Yes [name], MD  omega-3 acid ethyl esters (LOVAZA) 1 g capsule Take 4 g by mouth daily.   Yes [provider]  omeprazole (PRILOSEC) 40 MG capsule Take 1 capsule (40 mg total) by mouth daily. 12/16/17  Yes [name], Cristopher Estimable, MD  Pancrelipase, Lip-Prot-Amyl, (CREON) 24000-76000 units CPEP TAKE 3 CAPSULES THREE TIMES DAILY WITH MEALS AND ONE CAPSULE WITH SNACK TWICE DAILY Patient taking differently: Take 1-3 capsules by mouth 5 (five) times daily. Patient takes 3 capsules by mouth three times a day with meals and 1 capsule by mouth twice a day with snacks 10/24/17  Yes Carlis Stable, NP  Vitamin Mixture (ESTER-C) 500-60 MG TABS Take 1 tablet by mouth daily. Pt takes on Sunday, Monday, Wednesday, and Friday.   Yes [provider]  Wheat Dextrin (BENEFIBER DRINK MIX) PACK Take by mouth daily.   Yes [provider]   HYDROcodone-acetaminophen (NORCO/VICODIN) 5-325 MG tablet Take 1 tablet by mouth every 4 (four) hours as needed. 06/15/18   Isla Pence, MD  ondansetron (ZOFRAN ODT) 4 MG disintegrating tablet Take 1 tablet (4 mg total) by mouth every 8 (eight) hours as needed. 06/15/18   Isla Pence, MD  vitamin E 400 UNIT capsule Take 400 Units by mouth 4 (four) times a week. Pt takes on Tuesday, Thursday, Saturday, and Sunday.    [provider]    Family History Family History  Problem Relation Age of Onset  . Ovarian cancer Mother   . Diabetes Father   . Heart disease Father   . Breast cancer Sister   . Liver cancer Sister   . Colon cancer Cousin        Paternal side  . Diabetes Maternal Aunt   . Liver disease Cousin        Maternal side, never drank    Social History Social History   Tobacco Use  . Smoking status: Never Smoker  . Smokeless tobacco: Never Used  . Tobacco comment: Never smoked  Substance Use Topics  . Alcohol use: No    Alcohol/week: 0.0 standard drinks  . Drug use: No     Allergies   Adhesive [tape]; Estrogens; and Sulfonamide derivatives   Review of Systems Review of Systems  Musculoskeletal:       Left wrist and left hip pain  All other systems reviewed and are negative.    Physical Exam Updated Vital Signs BP (!) 144/57   Pulse (!) 106   Temp 98 F (36.7 C) (Oral)   Resp (!) 31   Ht 5' 4.5" (1.638 m)   Wt 67.1 kg   SpO2 98%   BMI 25.01 kg/m   Physical Exam Vitals signs and nursing note reviewed.  Constitutional:      Appearance: Normal appearance.  HENT:     Head: Normocephalic and atraumatic.     Right Ear: External ear normal.     Left Ear: External ear normal.     Nose: Nose normal.     Mouth/Throat:     Mouth: Mucous membranes are moist.  Eyes:     Extraocular Movements: Extraocular movements intact.     Conjunctiva/sclera: Conjunctivae normal.     Pupils: Pupils are equal, round, and reactive to light.  Neck:      Musculoskeletal: Normal range of motion and neck supple.  Cardiovascular:     Rate and Rhythm: Normal rate and regular rhythm.     Pulses: Normal pulses.     Heart sounds: Normal heart sounds.  Pulmonary:     Effort: Pulmonary effort is normal.     Breath sounds: Normal breath sounds.  Abdominal:     General: Abdomen is flat.  Musculoskeletal:     Left wrist: She exhibits decreased range of motion, tenderness and deformity.     Left hip: She exhibits tenderness.  Skin:    General: Skin is warm.     Capillary Refill: Capillary refill takes less than 2 seconds.  Neurological:     General: No focal deficit present.     Mental Status: She is alert and oriented to person, place, and time.  Psychiatric:        Mood and Affect: Mood normal.      ED Treatments / Results  Labs (all labs ordered are listed, but only abnormal results are displayed) Labs Reviewed - No data to display  EKG None  Radiology Dg Wrist Complete Left  Result Date: 06/15/2018 CLINICAL DATA:  Post reduction EXAM: LEFT WRIST - COMPLETE 3+ VIEW COMPARISON:  06/15/2018 FINDINGS: Comminuted fracture distal radius with significant improvement in alignment post reduction. Mild posterior displacement and angulation, significantly improved. Ulnar styloid fracture with mild displacement is unchanged. No carpal bone fractures IMPRESSION: Significant improvement in alignment of comminuted fracture distal radius. No change in ulnar styloid fracture. Electronically Signed   By: Franchot Gallo M.D.   On: 06/15/2018 15:14   Dg Wrist Complete Left  Result Date: 06/15/2018 CLINICAL DATA:  Left wrist pain and swelling. Patient reports a grandfather clock fell on wrist. EXAM: LEFT WRIST - COMPLETE 3+ VIEW COMPARISON:  None. FINDINGS: The bones are demineralized. There are acute fractures of the distal radius and ulna. Distal radial fracture is comminuted with extension to the distal articular surface and demonstrates posterior  displacement and angulation. There is a nondisplaced fracture of the ulnar styloid. The carpal bones appear intact. IMPRESSION: Comminuted and displaced intra-articular fracture of the distal radius (Colles fracture). Nondisplaced ulnar styloid fracture. Electronically Signed   By: Richardean Sale M.D.   On: 06/15/2018 12:14   Dg Hip Unilat W Or Wo Pelvis 2-3 Views Left  Result Date: 06/15/2018 CLINICAL DATA:  85 year old who had a grandfather clock that she was moving fall upon her earlier today. Initial encounter. EXAM: DG HIP (WITH OR WITHOUT PELVIS) 2-3V LEFT COMPARISON:  None. FINDINGS: No evidence of acute fracture or dislocation. Mild axial joint space narrowing. Bone mineral density relatively well preserved for age. No fractures involving the pelvis. Symmetric mild axial joint space narrowing in the contralateral RIGHT hip. Sacroiliac joints and symphysis pubis anatomically aligned without diastasis or significant degenerative change. Severe degenerative changes involving the visualized lower lumbar spine. IMPRESSION: 1. No acute osseous abnormality. 2. Mild osteoarthritis involving both hips. 3. Severe degenerative changes involving the visualized lower lumbar spine. Electronically Signed   By: Evangeline Dakin M.D.   On: 06/15/2018 12:44    Procedures .Splint Application Date/Time: 09/25/8108 2:50 PM Performed by: Isla Pence, MD Authorized by: Isla Pence, MD   Consent:    Consent obtained:  Verbal  Consent given by:  Patient   Risks discussed:  Discoloration, numbness, pain and swelling   Alternatives discussed:  No treatment Pre-procedure details:    Sensation:  Normal Procedure details:    Laterality:  Left   Location:  Wrist   Wrist:  L wrist   Splint type:  Sugar tong   Supplies:  Cotton padding, Ortho-Glass and sling Post-procedure details:    Pain:  Improved   Sensation:  Normal   Patient tolerance of procedure:  Tolerated well, no immediate complications  .Sedation Date/Time: 06/15/2018 2:51 PM Performed by: Isla Pence, MD Authorized by: Isla Pence, MD   Consent:    Consent obtained:  Verbal   Consent given by:  Patient   Risks discussed:  Allergic reaction, dysrhythmia, inadequate sedation, nausea, prolonged hypoxia resulting in organ damage, prolonged sedation necessitating reversal, respiratory compromise necessitating ventilatory assistance and intubation and vomiting   Alternatives discussed:  Analgesia without sedation, anxiolysis and regional anesthesia Universal protocol:    Procedure explained and questions answered to patient or proxy's satisfaction: yes     Relevant documents present and verified: yes     Test results available and properly labeled: yes     Imaging studies available: yes     Required blood products, implants, devices, and special equipment available: yes     Site/side marked: yes     Immediately prior to procedure a time out was called: yes     Patient identity confirmation method:  Verbally with patient Indications:    Procedure necessitating sedation performed by:  Physician performing sedation Pre-sedation assessment:    Time since last food or drink:  5   ASA classification: class 2 - patient with mild systemic disease     Neck mobility: normal     Mouth opening:  3 or more finger widths   Thyromental distance:  4 finger widths   Mallampati score:  I - soft palate, uvula, fauces, pillars visible   Pre-sedation assessments completed and reviewed: airway patency, cardiovascular function, hydration status, mental status, nausea/vomiting, pain level, respiratory function and temperature   Immediate pre-procedure details:    Reassessment: Patient reassessed immediately prior to procedure     Reviewed: vital signs, relevant labs/tests and NPO status     Verified: bag valve mask available, emergency equipment available, intubation equipment available, IV patency confirmed, oxygen available and suction  available   Procedure details (see MAR for exact dosages):    Preoxygenation:  Nasal cannula   Sedation:  Propofol   Analgesia:  Fentanyl   Intra-procedure monitoring:  Blood pressure monitoring, cardiac monitor, continuous pulse oximetry, frequent LOC assessments, frequent vital sign checks and continuous capnometry   Intra-procedure events: none     Total Provider sedation time (minutes):  30 Post-procedure details:    Post-sedation assessment completed:  06/15/2018 2:51 PM   Attendance: Constant attendance by certified staff until patient recovered     Recovery: Patient returned to pre-procedure baseline     Post-sedation assessments completed and reviewed: airway patency, cardiovascular function, hydration status, mental status, nausea/vomiting, pain level, respiratory function and temperature     Patient is stable for discharge or admission: yes     Patient tolerance:  Tolerated well, no immediate complications Reduction of fracture Date/Time: 06/15/2018 2:51 PM Performed by: Isla Pence, MD Authorized by: Isla Pence, MD  Consent: Verbal consent obtained. Consent given by: patient Patient understanding: patient states understanding of the procedure being performed Patient identity confirmed: verbally with patient Time out: Immediately prior  to procedure a "time out" was called to verify the correct patient, procedure, equipment, support staff and site/side marked as required. Preparation: Patient was prepped and draped in the usual sterile fashion. Local anesthesia used: no  Anesthesia: Local anesthesia used: no  Sedation: Patient sedated: yes Sedation type: moderate (conscious) sedation Sedatives: propofol and see MAR for details Analgesia: fentanyl  Patient tolerance: Patient tolerated the procedure well with no immediate complications    (including critical care time)  Medications Ordered in ED Medications  fentaNYL (SUBLIMAZE) injection 25 mcg (25 mcg  Intravenous Given 06/15/18 1212)  ondansetron (ZOFRAN) injection 4 mg (4 mg Intravenous Given 06/15/18 1212)  propofol (DIPRIVAN) 10 mg/mL bolus/IV push 33.6 mg (33.6 mg Intravenous Given 06/15/18 1416)     Initial Impression / Assessment and Plan / ED Course  I have reviewed the triage vital signs and the nursing notes.  Pertinent labs & imaging results that were available during my care of the patient were reviewed by me and considered in my medical decision making (see chart for details).    After reduction, alignment is much better.     Pt able to ambulate.  She knows to f/u with Dr. Percell Miller.  Final Clinical Impressions(s) / ED Diagnoses   Final diagnoses:  Closed fracture of distal end of left radius, unspecified fracture morphology, initial encounter    ED Discharge Orders         Ordered    HYDROcodone-acetaminophen (NORCO/VICODIN) 5-325 MG tablet  Every 4 hours PRN     06/15/18 1525    ondansetron (ZOFRAN ODT) 4 MG disintegrating tablet  Every 8 hours PRN     06/15/18 1525           Isla Pence, MD 06/15/18 1525

## 2018-06-15 NOTE — Sedation Documentation (Signed)
ED Provider at bedside. 

## 2018-06-15 NOTE — Sedation Documentation (Signed)
X ray in room.

## 2018-06-16 ENCOUNTER — Telehealth: Payer: Self-pay | Admitting: Family Medicine

## 2018-06-16 NOTE — Telephone Encounter (Signed)
Pt fell and broke her left wrist yesterday and is seeing Dr. Percell Miller tomorrow morning to see if she will need surgery or if a cast will work.   She is needing orders for home health to come out and help around the house.

## 2018-06-16 NOTE — Telephone Encounter (Signed)
Tried to contact patient; no answering machine available.

## 2018-06-16 NOTE — Telephone Encounter (Signed)
Please advise. Thank you

## 2018-06-16 NOTE — Telephone Encounter (Signed)
Tell pt I a m very sorry but congress does not allow h h nurses to do work around the house, it is only for skilled care, I am very sorry she broke her wrist and I am sure that is causing her significant trouble doing her household activities but h h does not proved those sorts of activities. She will need to recruit her family members or consider hiring a personal aid, she can get lists of personail aid candidates thru social srvices

## 2018-06-17 DIAGNOSIS — S52502A Unspecified fracture of the lower end of left radius, initial encounter for closed fracture: Secondary | ICD-10-CM | POA: Diagnosis not present

## 2018-06-19 ENCOUNTER — Encounter (HOSPITAL_BASED_OUTPATIENT_CLINIC_OR_DEPARTMENT_OTHER)
Admission: RE | Admit: 2018-06-19 | Discharge: 2018-06-19 | Disposition: A | Discharge status: Home / Self Care | Payer: Medicare Other | Source: Ambulatory Visit | Attending: Orthopedic Surgery | Admitting: Orthopedic Surgery

## 2018-06-19 ENCOUNTER — Other Ambulatory Visit: Payer: Self-pay

## 2018-06-19 ENCOUNTER — Encounter (HOSPITAL_BASED_OUTPATIENT_CLINIC_OR_DEPARTMENT_OTHER): Payer: Self-pay | Admitting: *Deleted

## 2018-06-19 DIAGNOSIS — Z01818 Encounter for other preprocedural examination: Secondary | ICD-10-CM | POA: Diagnosis not present

## 2018-06-19 LAB — BASIC METABOLIC PANEL
Anion gap: 10 (ref 5–15)
BUN: 24 mg/dL — ABNORMAL HIGH (ref 8–23)
CO2: 27 mmol/L (ref 22–32)
Calcium: 9.7 mg/dL (ref 8.9–10.3)
Chloride: 104 mmol/L (ref 98–111)
Creatinine, Ser: 0.65 mg/dL (ref 0.44–1.00)
GFR calc Af Amer: >60 mL/min (ref >60)
GFR calc non Af Amer: >60 mL/min (ref >60)
Glucose, Bld: 136 mg/dL — ABNORMAL HIGH (ref 70–99)
Potassium: 4.5 mmol/L (ref 3.5–5.1)
Sodium: 141 mmol/L (ref 135–145)

## 2018-06-20 DIAGNOSIS — E119 Type 2 diabetes mellitus without complications: Secondary | ICD-10-CM | POA: Diagnosis not present

## 2018-06-22 NOTE — H&P (Signed)
[facility]  [address] [place], [place] [address] [phone]   A Division of [facility]  RE: Deborah Gray, Deborah Gray   [id number]      DOB: Aug 14, 1933 INITIAL EVALUATION:  06-17-18   REASON FOR VISIT: New evaluation for an acute left distal radius fracture suffered 06-15-18.   HPI: This problem occurred on 06-15-18 when she was trying to move a large, heavy, grandfather clock which fell onto her. She fell to the ground. She presented to the emergency room where X-rays showed a comminuted distal radius fracture, ulnar styloid fracture. She was reduced, splinted and referred for follow-up.  The pain is controlled. She denies numbness or paresthesia.  Past medical history: Significant for diabetes. The last recorded A1C was 6.8.  She has hypertension and a history of scarlet fever as a child, likely with some rheumatic disease or aortic stenosis. She has a murmur per her report. She has a history of total knee arthroplasty in 2010.  She is a nonsmoker.   EXAMINATION: Well appearing female in no apparent distress. The left wrist is splinted-this is in good condition. Sensation intact distally.  IMAGES: Pre and  post reduction X-rays reviewed by me on [place]. These show improved alignment, comminuted  distal radius fracture.     ASSESSMENT/PLAN: Acute traumatic left distal radius fracture. 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 The details of the risks and benefits for post operative course, surgical fixation were discussed with the patient and her son and they would like to proceed with this. She will follow-up post operatively.    Ernesta Amble.  Percell Miller, M.D.  dictated by  Lemar Lofty, PA-C Electronically verified by Ernesta Amble. Percell Miller, M.D. TDM: HCM: jgc D:  06-18-18 T:   06-22-18 cc:  Rosemary Holms, MD fax [phone]

## 2018-06-23 ENCOUNTER — Other Ambulatory Visit: Payer: Self-pay

## 2018-06-23 ENCOUNTER — Encounter (HOSPITAL_BASED_OUTPATIENT_CLINIC_OR_DEPARTMENT_OTHER): Payer: Self-pay | Admitting: *Deleted

## 2018-06-23 ENCOUNTER — Encounter (HOSPITAL_BASED_OUTPATIENT_CLINIC_OR_DEPARTMENT_OTHER)
Admission: RE | Disposition: A | Discharge status: Home / Self Care | Payer: Self-pay | Source: Home / Self Care | Attending: Orthopedic Surgery

## 2018-06-23 ENCOUNTER — Ambulatory Visit (HOSPITAL_BASED_OUTPATIENT_CLINIC_OR_DEPARTMENT_OTHER): Payer: Medicare Other | Admitting: Anesthesiology

## 2018-06-23 ENCOUNTER — Ambulatory Visit (HOSPITAL_BASED_OUTPATIENT_CLINIC_OR_DEPARTMENT_OTHER)
Admission: RE | Admit: 2018-06-23 | Discharge: 2018-06-23 | Disposition: A | Discharge status: Home / Self Care | Payer: Medicare Other | Attending: Orthopedic Surgery | Admitting: Orthopedic Surgery

## 2018-06-23 DIAGNOSIS — S52552D Other extraarticular fracture of lower end of left radius, subsequent encounter for closed fracture with routine healing: Secondary | ICD-10-CM | POA: Insufficient documentation

## 2018-06-23 DIAGNOSIS — K219 Gastro-esophageal reflux disease without esophagitis: Secondary | ICD-10-CM | POA: Diagnosis not present

## 2018-06-23 DIAGNOSIS — E119 Type 2 diabetes mellitus without complications: Secondary | ICD-10-CM | POA: Insufficient documentation

## 2018-06-23 DIAGNOSIS — W19XXXD Unspecified fall, subsequent encounter: Secondary | ICD-10-CM | POA: Insufficient documentation

## 2018-06-23 DIAGNOSIS — I10 Essential (primary) hypertension: Secondary | ICD-10-CM | POA: Diagnosis not present

## 2018-06-23 DIAGNOSIS — S62102A Fracture of unspecified carpal bone, left wrist, initial encounter for closed fracture: Secondary | ICD-10-CM | POA: Diagnosis not present

## 2018-06-23 DIAGNOSIS — S52572A Other intraarticular fracture of lower end of left radius, initial encounter for closed fracture: Secondary | ICD-10-CM

## 2018-06-23 DIAGNOSIS — S52502A Unspecified fracture of the lower end of left radius, initial encounter for closed fracture: Secondary | ICD-10-CM | POA: Diagnosis not present

## 2018-06-23 DIAGNOSIS — G8918 Other acute postprocedural pain: Secondary | ICD-10-CM | POA: Diagnosis not present

## 2018-06-23 DIAGNOSIS — S52592D Other fractures of lower end of left radius, subsequent encounter for closed fracture with routine healing: Secondary | ICD-10-CM | POA: Diagnosis present

## 2018-06-23 HISTORY — DX: Fracture of unspecified carpal bone, left wrist, initial encounter for closed fracture: S62.102A

## 2018-06-23 HISTORY — PX: ORIF WRIST FRACTURE: SHX2133

## 2018-06-23 LAB — GLUCOSE, CAPILLARY
Glucose-Capillary: 134 mg/dL — ABNORMAL HIGH (ref 70–99)
Glucose-Capillary: 133 mg/dL — ABNORMAL HIGH (ref 70–99)

## 2018-06-23 SURGERY — OPEN REDUCTION INTERNAL FIXATION (ORIF) WRIST FRACTURE
Anesthesia: Monitor Anesthesia Care | Site: Wrist | Laterality: Left

## 2018-06-23 MED ORDER — FENTANYL CITRATE (PF) 100 MCG/2ML IJ SOLN
50.0000 ug | INTRAMUSCULAR | Status: DC | PRN
  Administered 2018-06-23: 07:00:00 75 ug via INTRAVENOUS

## 2018-06-23 MED ORDER — BUPIVACAINE HCL (PF) 0.5 % IJ SOLN
INTRAMUSCULAR | Status: AC
  Filled 2018-06-23: qty 30

## 2018-06-23 MED ORDER — ONDANSETRON HCL 4 MG PO TABS: 4.0000 mg | ORAL_TABLET | Freq: Three times a day (TID) | ORAL | 0 refills | Status: DC | PRN

## 2018-06-23 MED ORDER — PROPOFOL 500 MG/50ML IV EMUL
INTRAVENOUS | Status: DC | PRN
  Administered 2018-06-23: 50 ug/kg/min via INTRAVENOUS

## 2018-06-23 MED ORDER — ONDANSETRON HCL 4 MG/2ML IJ SOLN
INTRAMUSCULAR | Status: AC
  Filled 2018-06-23: qty 2

## 2018-06-23 MED ORDER — LACTATED RINGERS IV SOLN
INTRAVENOUS | Status: DC
  Administered 2018-06-23: 07:00:00 via INTRAVENOUS

## 2018-06-23 MED ORDER — ACETAMINOPHEN 500 MG PO TABS
ORAL_TABLET | ORAL | Status: AC
  Filled 2018-06-23: qty 2

## 2018-06-23 MED ORDER — MIDAZOLAM HCL 2 MG/2ML IJ SOLN: 1.0000 mg | INTRAMUSCULAR | Status: DC | PRN

## 2018-06-23 MED ORDER — CEFAZOLIN SODIUM-DEXTROSE 2-4 GM/100ML-% IV SOLN
INTRAVENOUS | Status: AC
  Filled 2018-06-23: qty 100

## 2018-06-23 MED ORDER — SCOPOLAMINE 1 MG/3DAYS TD PT72: 1.0000 | MEDICATED_PATCH | Freq: Once | TRANSDERMAL | Status: DC | PRN

## 2018-06-23 MED ORDER — PROMETHAZINE HCL 25 MG/ML IJ SOLN: 6.2500 mg | INTRAMUSCULAR | Status: DC | PRN

## 2018-06-23 MED ORDER — HYDROCODONE-ACETAMINOPHEN 5-325 MG PO TABS: 1.0000 | ORAL_TABLET | Freq: Four times a day (QID) | ORAL | 0 refills | Status: AC | PRN

## 2018-06-23 MED ORDER — ROPIVACAINE HCL 5 MG/ML IJ SOLN
INTRAMUSCULAR | Status: DC | PRN
  Administered 2018-06-23: 20 mL via PERINEURAL

## 2018-06-23 MED ORDER — FENTANYL CITRATE (PF) 100 MCG/2ML IJ SOLN
INTRAMUSCULAR | Status: AC
  Filled 2018-06-23: qty 2

## 2018-06-23 MED ORDER — CLONIDINE HCL (ANALGESIA) 100 MCG/ML EP SOLN
EPIDURAL | Status: DC | PRN
  Administered 2018-06-23: 70 ug

## 2018-06-23 MED ORDER — ACETAMINOPHEN 10 MG/ML IV SOLN: 1000.0000 mg | Freq: Once | INTRAVENOUS | Status: DC | PRN

## 2018-06-23 MED ORDER — CEFAZOLIN SODIUM-DEXTROSE 2-4 GM/100ML-% IV SOLN
2.0000 g | INTRAVENOUS | Status: AC
  Administered 2018-06-23: 2 g via INTRAVENOUS

## 2018-06-23 MED ORDER — FENTANYL CITRATE (PF) 100 MCG/2ML IJ SOLN: 25.0000 ug | INTRAMUSCULAR | Status: DC | PRN

## 2018-06-23 MED ORDER — ACETAMINOPHEN 500 MG PO TABS
1000.0000 mg | ORAL_TABLET | Freq: Once | ORAL | Status: AC
  Administered 2018-06-23: 1000 mg via ORAL

## 2018-06-23 MED ORDER — PROPOFOL 500 MG/50ML IV EMUL
INTRAVENOUS | Status: AC
  Filled 2018-06-23: qty 50

## 2018-06-23 MED ORDER — LIDOCAINE-EPINEPHRINE (PF) 1.5 %-1:200000 IJ SOLN
INTRAMUSCULAR | Status: DC | PRN
  Administered 2018-06-23: 10 mL via PERINEURAL

## 2018-06-23 MED ORDER — CHLORHEXIDINE GLUCONATE 4 % EX LIQD: 60.0000 mL | Freq: Once | CUTANEOUS | Status: DC

## 2018-06-23 SURGICAL SUPPLY — 78 items
APL PRP STRL LF DISP 70% ISPRP (MISCELLANEOUS) ×1
BANDAGE ACE 4X5 VEL STRL LF (GAUZE/BANDAGES/DRESSINGS) ×2 IMPLANT
BIT DRILL 2.2 SS TIBIAL (BIT) ×1 IMPLANT
BLADE SURG 15 STRL LF DISP TIS (BLADE) ×2 IMPLANT
BLADE SURG 15 STRL SS (BLADE) ×4
BNDG CMPR 9X4 STRL LF SNTH (GAUZE/BANDAGES/DRESSINGS) ×1
BNDG ESMARK 4X9 LF (GAUZE/BANDAGES/DRESSINGS) ×2 IMPLANT
CHLORAPREP W/TINT 26 (MISCELLANEOUS) ×2 IMPLANT
CLSR STERI-STRIP ANTIMIC 1/2X4 (GAUZE/BANDAGES/DRESSINGS) ×1 IMPLANT
CORD BIPOLAR FORCEPS 12FT (ELECTRODE) ×2 IMPLANT
COVER BACK TABLE REUSABLE LG (DRAPES) ×2 IMPLANT
COVER WAND RF STERILE (DRAPES) IMPLANT
CUFF TOURN SGL QUICK 18X4 (TOURNIQUET CUFF) ×1 IMPLANT
CUFF TOURN SGL QUICK 24 (TOURNIQUET CUFF)
CUFF TRNQT CYL 24X4X16.5-23 (TOURNIQUET CUFF) IMPLANT
DECANTER SPIKE VIAL GLASS SM (MISCELLANEOUS) IMPLANT
DRAPE EXTREMITY T 121X128X90 (DISPOSABLE) ×2 IMPLANT
DRAPE IMP U-DRAPE 54X76 (DRAPES) ×2 IMPLANT
DRAPE OEC MINIVIEW 54X84 (DRAPES) ×2 IMPLANT
DRAPE SURG 17X23 STRL (DRAPES) ×1 IMPLANT
DRSG EMULSION OIL 3X3 NADH (GAUZE/BANDAGES/DRESSINGS) ×2 IMPLANT
DRSG MEPILEX BORDER 4X4 (GAUZE/BANDAGES/DRESSINGS) ×1 IMPLANT
GAUZE SPONGE 4X4 12PLY STRL (GAUZE/BANDAGES/DRESSINGS) ×2 IMPLANT
GAUZE XEROFORM 1X8 LF (GAUZE/BANDAGES/DRESSINGS) IMPLANT
GLOVE BIO SURGEON STRL SZ 6.5 (GLOVE) ×1 IMPLANT
GLOVE BIO SURGEON STRL SZ7.5 (GLOVE) ×4 IMPLANT
GLOVE BIOGEL PI IND STRL 8 (GLOVE) ×2 IMPLANT
GLOVE BIOGEL PI INDICATOR 8 (GLOVE) ×2
GLOVE EXAM NITRILE MD LF STRL (GLOVE) ×1 IMPLANT
GOWN STRL REUS W/ TWL LRG LVL3 (GOWN DISPOSABLE) ×2 IMPLANT
GOWN STRL REUS W/ TWL XL LVL3 (GOWN DISPOSABLE) ×1 IMPLANT
GOWN STRL REUS W/TWL LRG LVL3 (GOWN DISPOSABLE) ×4
GOWN STRL REUS W/TWL XL LVL3 (GOWN DISPOSABLE) ×2
K-WIRE 1.6 (WIRE) ×4
K-WIRE FX5X1.6XNS BN SS (WIRE) ×2
KWIRE FX5X1.6XNS BN SS (WIRE) IMPLANT
NDL HYPO 25X1 1.5 SAFETY (NEEDLE) ×1 IMPLANT
NEEDLE HYPO 25X1 1.5 SAFETY (NEEDLE) IMPLANT
NS IRRIG 1000ML POUR BTL (IV SOLUTION) ×2 IMPLANT
PACK BASIN DAY SURGERY FS (CUSTOM PROCEDURE TRAY) ×2 IMPLANT
PAD CAST 4YDX4 CTTN HI CHSV (CAST SUPPLIES) ×1 IMPLANT
PADDING CAST ABS 4INX4YD NS (CAST SUPPLIES) ×1
PADDING CAST ABS COTTON 4X4 ST (CAST SUPPLIES) ×1 IMPLANT
PADDING CAST COTTON 4X4 STRL (CAST SUPPLIES) ×4
PEG LOCKING SMOOTH 2.2X18 (Peg) ×4 IMPLANT
PEG LOCKING SMOOTH 2.2X20 (Screw) ×3 IMPLANT
PLATE STANDARD DVR LEFT (Plate) ×2 IMPLANT
PLATE STD DVR LT 24X51 (Plate) IMPLANT
SCREW  LP NL 2.7X13MM (Screw) ×1 IMPLANT
SCREW  LP NL 2.7X15MM (Screw) ×1 IMPLANT
SCREW  LP NL 2.7X16MM (Screw) ×1 IMPLANT
SCREW LP NL 2.7X13MM (Screw) IMPLANT
SCREW LP NL 2.7X15MM (Screw) IMPLANT
SCREW LP NL 2.7X16MM (Screw) IMPLANT
SLEEVE SCD COMPRESS KNEE MED (MISCELLANEOUS) ×1 IMPLANT
SLING ARM FOAM STRAP MED (SOFTGOODS) ×1 IMPLANT
SPLINT FAST PLASTER 5X30 (CAST SUPPLIES)
SPLINT PLASTER CAST FAST 5X30 (CAST SUPPLIES) IMPLANT
SPLINT PLASTER CAST XFAST 3X15 (CAST SUPPLIES) IMPLANT
SPLINT PLASTER CAST XFAST 4X15 (CAST SUPPLIES) IMPLANT
SPLINT PLASTER XTRA FAST SET 4 (CAST SUPPLIES) ×10
SPLINT PLASTER XTRA FASTSET 3X (CAST SUPPLIES)
SPONGE LAP 4X18 RFD (DISPOSABLE) ×2 IMPLANT
SUCTION FRAZIER HANDLE 10FR (MISCELLANEOUS) ×1
SUCTION TUBE FRAZIER 10FR DISP (MISCELLANEOUS) IMPLANT
SUT ETHILON 3 0 PS 1 (SUTURE) ×1 IMPLANT
SUT MON AB 2-0 CT1 36 (SUTURE) ×1 IMPLANT
SUT MON AB 4-0 PC3 18 (SUTURE) IMPLANT
SUT PROLENE 3 0 PS 2 (SUTURE) IMPLANT
SUT VIC AB 0 SH 27 (SUTURE) ×1 IMPLANT
SUT VIC AB 2-0 SH 27 (SUTURE) ×2
SUT VIC AB 2-0 SH 27XBRD (SUTURE) IMPLANT
SUT VIC AB 3-0 FS2 27 (SUTURE) IMPLANT
SYR BULB 3OZ (MISCELLANEOUS) ×2 IMPLANT
SYR CONTROL 10ML LL (SYRINGE) ×1 IMPLANT
TOWEL GREEN STERILE FF (TOWEL DISPOSABLE) ×2 IMPLANT
TUBE CONNECTING 20X1/4 (TUBING) ×1 IMPLANT
UNDERPAD 30X30 (UNDERPADS AND DIAPERS) ×2 IMPLANT

## 2018-06-23 NOTE — Anesthesia Procedure Notes (Signed)
Anesthesia Procedure Image    

## 2018-06-23 NOTE — Op Note (Signed)
06/23/2018  8:32 AM  PATIENT:  Deborah Gray    PRE-OPERATIVE DIAGNOSIS:  LEFT WRIST FRACTURE  POST-OPERATIVE DIAGNOSIS:  Same  PROCEDURE:  OPEN REDUCTION INTERNAL FIXATION (ORIF) LEFT WRIST FRACTURE  SURGEON:  Renette Butters, MD  ASSISTANT: Roxan Hockey, PA-C, he was present and scrubbed throughout the case, critical for completion in a timely fashion, and for retraction, instrumentation, and closure.   ANESTHESIA:   gen  PREOPERATIVE INDICATIONS:  Deborah Gray is a  85 y.o. female with a diagnosis of LEFT WRIST FRACTURE who failed conservative measures and elected for surgical management.    The risks benefits and alternatives were discussed with the patient preoperatively including but not limited to the risks of infection, bleeding, nerve injury, cardiopulmonary complications, the need for revision surgery, among others, and the patient was willing to proceed.  OPERATIVE IMPLANTS: DVR plate  OPERATIVE FINDINGS: unstable fracture  BLOOD LOSS: min  COMPLICATIONS: none  TOURNIQUET TIME: 30  OPERATIVE PROCEDURE:  Patient was identified in the preoperative holding area and site was marked by me She was transported to the operating theater and placed on the table in supine position taking care to pad all bony prominences. After a preincinduction time out anesthesia was induced. The left upper extremity was prepped and draped in normal sterile fashion and a pre-incision timeout was performed. She received ancef for preoperative antibiotics.   I made a 5 cm incision centered over her FCR tendon and dissected down carefully to the level of the flexor tendon sheath and incise this longitudinally and retracted the FCR radially and incised the dorsal aspect of the sheath.   I bluntly dissected the FPL muscle belly away from the brachioradialis and then sharply incised the pronator tendon from the distal radius and from the wrist capsule. I Elevated this off the bone the  fractures visible.   I released the brachioradialis from its insertion. I then debrided the fracture and performed a manual reduction.   I selected a plate and I placed it on the bone. I pinned it into place and was happy on multiple radiographic views with it's placement. I then fixed the plate distally with the locking pegs. I confirmed no articular penetration with the pegs and that none were prominent dorsally.   I then reduced the plate to the shaft improving the volar and radial tilt of her distal radius.  I was happy with the final fluoro xrays    I thoroughly irrigated the wound and closed the pronator over top of the plate and then closed the skin in layers with absorbable stitch. Sterile dressing was applied using the PACU in stable condition.  POST OPERATIVE PLAN: NWB, Splint full time. Ambulate for DVT px.

## 2018-06-23 NOTE — Anesthesia Postprocedure Evaluation (Signed)
Anesthesia Post Note  Patient: Deborah Gray  Procedure(s) Performed: OPEN REDUCTION INTERNAL FIXATION (ORIF) LEFT WRIST FRACTURE (Left Wrist)     Patient location during evaluation: PACU Anesthesia Type: Regional and MAC Level of consciousness: awake and alert Pain management: pain level controlled Vital Signs Assessment: post-procedure vital signs reviewed and stable Respiratory status: spontaneous breathing, nonlabored ventilation, respiratory function stable and patient connected to nasal cannula oxygen Cardiovascular status: stable and blood pressure returned to baseline Postop Assessment: no apparent nausea or vomiting Anesthetic complications: no    Last Vitals:  Vitals:   06/23/18 0900 06/23/18 0915  BP: (!) 103/49 (!) 103/48  Pulse: 81 78  Resp: 17 15  Temp:    SpO2: 99% 92%    Last Pain:  Vitals:   06/23/18 0900  TempSrc:   PainSc: 0-No pain                 Kailyn Dubie S

## 2018-06-23 NOTE — Progress Notes (Signed)
Assisted Dr. Rose with left, ultrasound guided, supraclavicular block. Side rails up, monitors on throughout procedure. See vital signs in flow sheet. Tolerated Procedure well. 

## 2018-06-23 NOTE — Interval H&P Note (Signed)
I participated in the care of this patient and agree with the above history, physical and evaluation. I performed a review of the history and a physical exam as detailed   Shaneequa Bahner Daniel Rami Budhu MD  

## 2018-06-23 NOTE — Anesthesia Procedure Notes (Signed)
Anesthesia Regional Block: Interscalene brachial plexus block   Pre-Anesthetic Checklist: ,, timeout performed, Correct Patient, Correct Site, Correct Laterality, Correct Procedure, Correct Position, site marked, Risks and benefits discussed,  Surgical consent,  Pre-op evaluation,  At surgeon's request and post-op pain management  Laterality: Right  Prep: chloraprep       Needles:  Injection technique: Single-shot  Needle Type: Echogenic Needle     Needle Length: 9cm      Additional Needles:   Procedures:,,,, ultrasound used (permanent image in chart),,,,  Narrative:  Start time: 06/23/2018 7:07 AM End time: 06/23/2018 7:14 AM Injection made incrementally with aspirations every 5 mL.  Performed by: Personally  Anesthesiologist: Myrtie Soman, MD  Additional Notes: Patient tolerated the procedure well without complications

## 2018-06-23 NOTE — Discharge Instructions (Signed)
Keep wrist elevated with ice as much as possible to reduce pain and swelling.  Diet: As you were doing prior to hospitalization   Shower:  You have a splint on, leave the splint in place and keep the splint dry with a plastic bag.  Dressing:  You have a splint - leave the splint in place and we will change your bandages during your first follow-up appointment.    Activity:  Increase activity slowly as tolerated, but follow the weight bearing instructions below.  The rules on driving is that you can not be taking narcotics while you drive, and you must feel in control of the vehicle.    Weight Bearing:  Non weight bearing affected wrist.  Sling for comfort.   To prevent constipation: you may use a stool softener such as -  Colace (over the counter) 100 mg by mouth twice a day  Drink plenty of fluids (prune juice may be helpful) and high fiber foods Miralax (over the counter) for constipation as needed.    Itching:  If you experience itching with your medications, try taking only a single pain pill, or even half a pain pill at a time.  You can also use benadryl over the counter for itching or also to help with sleep.   Precautions:  If you experience chest pain or shortness of breath - call 911 immediately for transfer to the hospital emergency department!!  If you develop a fever greater that 101 F, purulent drainage from wound, increased redness or drainage from wound, or calf pain -- Call the office at [phone]                                         Follow- Up Appointment:  Please call for an appointment to be seen in 1-2 weeks [place] - [phone]   [facility] Instructions  Activity: Get plenty of rest for the remainder of the day. A responsible individual must stay with you for 24 hours following the procedure.  For the next 24 hours, DO NOT: -Drive a car -Paediatric nurse -Drink alcoholic beverages -Take any medication unless instructed by your  physician -Make any legal decisions or sign important papers.  Meals: Start with liquid foods such as gelatin or soup. Progress to regular foods as tolerated. Avoid greasy, spicy, heavy foods. If nausea and/or vomiting occur, drink only clear liquids until the nausea and/or vomiting subsides. Call your physician if vomiting continues.  Special Instructions/Symptoms: Your throat may feel dry or sore from the anesthesia or the breathing tube placed in your throat during surgery. If this causes discomfort, gargle with warm salt water. The discomfort should disappear within 24 hours.    Regional Anesthesia Blocks  1. Numbness or the inability to move the "blocked" extremity may last from 3-48 hours after placement. The length of time depends on the medication injected and your individual response to the medication. If the numbness is not going away after 48 hours, call your surgeon.  2. The extremity that is blocked will need to be protected until the numbness is gone and the  Strength has returned. Because you cannot feel it, you will need to take extra care to avoid injury. Because it may be weak, you may have difficulty moving it or using it. You may not know what position it is in without looking at it while the block  is in effect.  3. For blocks in the legs and feet, returning to weight bearing and walking needs to be done carefully. You will need to wait until the numbness is entirely gone and the strength has returned. You should be able to move your leg and foot normally before you try and bear weight or walk. You will need someone to be with you when you first try to ensure you do not fall and possibly risk injury.  4. Bruising and tenderness at the needle site are common side effects and will resolve in a few days.  5. Persistent numbness or new problems with movement should be communicated to the surgeon or the [facility] [phone] [place]  [phone]).

## 2018-06-23 NOTE — Transfer of Care (Signed)
Immediate Anesthesia Transfer of Care Note  Patient: Deborah Gray  Procedure(s) Performed: OPEN REDUCTION INTERNAL FIXATION (ORIF) LEFT WRIST FRACTURE (Left Wrist)  Patient Location: PACU  Anesthesia Type:MAC combined with regional for post-op pain  Level of Consciousness: sedated  Airway & Oxygen Therapy: Patient Spontanous Breathing and Patient connected to nasal cannula oxygen  Post-op Assessment: Report given to RN and Post -op Vital signs reviewed and stable  Post vital signs: Reviewed and stable  Last Vitals:  Vitals Value Taken Time  BP 103/46 06/23/2018  8:47 AM  Temp    Pulse 88 06/23/2018  8:49 AM  Resp 20 06/23/2018  8:49 AM  SpO2 98 % 06/23/2018  8:49 AM  Vitals shown include unvalidated device data.  Last Pain:  Vitals:   06/23/18 0631  TempSrc: Oral  PainSc: 4       Patients Stated Pain Goal: 2 (74/25/95 [id number])  Complications: No apparent anesthesia complications

## 2018-06-23 NOTE — Anesthesia Preprocedure Evaluation (Signed)
Anesthesia Evaluation  Patient identified by MRN, date of birth, ID band Patient awake    Reviewed: Allergy & Precautions, NPO status , Patient's Chart, lab work & pertinent test results  History of Anesthesia Complications (+) PONV  Airway Mallampati: II  TM Distance: >3 FB Neck ROM: Full    Dental no notable dental hx.    Pulmonary neg pulmonary ROS,    Pulmonary exam normal breath sounds clear to auscultation       Cardiovascular hypertension, Normal cardiovascular exam Rhythm:Regular Rate:Normal     Neuro/Psych negative neurological ROS  negative psych ROS   GI/Hepatic Neg liver ROS, GERD  ,  Endo/Other  diabetes  Renal/GU negative Renal ROS  negative genitourinary   Musculoskeletal negative musculoskeletal ROS (+)   Abdominal   Peds negative pediatric ROS (+)  Hematology negative hematology ROS (+)   Anesthesia Other Findings   Reproductive/Obstetrics negative OB ROS                             Anesthesia Physical Anesthesia Plan  ASA: II  Anesthesia Plan: General   Post-op Pain Management:  Regional for Post-op pain   Induction: Intravenous  PONV Risk Score and Plan: 3 and Ondansetron, Dexamethasone and Treatment may vary due to age or medical condition  Airway Management Planned: LMA  Additional Equipment:   Intra-op Plan:   Post-operative Plan: Extubation in OR  Informed Consent: I have reviewed the patients History and Physical, chart, labs and discussed the procedure including the risks, benefits and alternatives for the proposed anesthesia with the patient or authorized representative who has indicated his/her understanding and acceptance.     Dental advisory given  Plan Discussed with: CRNA and Surgeon  Anesthesia Plan Comments:         Anesthesia Quick Evaluation

## 2018-06-24 ENCOUNTER — Encounter (HOSPITAL_BASED_OUTPATIENT_CLINIC_OR_DEPARTMENT_OTHER): Payer: Self-pay | Admitting: Orthopedic Surgery

## 2018-06-25 NOTE — Telephone Encounter (Signed)
Tried to call no answer. Has appt 5/14 for follow up can discuss then if not able to get in touch with pt.

## 2018-06-30 ENCOUNTER — Encounter: Payer: Self-pay | Admitting: Internal Medicine

## 2018-07-02 ENCOUNTER — Ambulatory Visit (INDEPENDENT_AMBULATORY_CARE_PROVIDER_SITE_OTHER): Payer: Medicare Other | Admitting: Family Medicine

## 2018-07-02 ENCOUNTER — Encounter: Payer: Self-pay | Admitting: Family Medicine

## 2018-07-02 ENCOUNTER — Other Ambulatory Visit: Payer: Self-pay

## 2018-07-02 DIAGNOSIS — E782 Mixed hyperlipidemia: Secondary | ICD-10-CM

## 2018-07-02 DIAGNOSIS — Z733 Stress, not elsewhere classified: Secondary | ICD-10-CM | POA: Diagnosis not present

## 2018-07-02 DIAGNOSIS — E1165 Type 2 diabetes mellitus with hyperglycemia: Secondary | ICD-10-CM

## 2018-07-02 DIAGNOSIS — I10 Essential (primary) hypertension: Secondary | ICD-10-CM

## 2018-07-02 DIAGNOSIS — K219 Gastro-esophageal reflux disease without esophagitis: Secondary | ICD-10-CM

## 2018-07-02 MED ORDER — LOSARTAN POTASSIUM 50 MG PO TABS: 50.0000 mg | ORAL_TABLET | Freq: Every evening | ORAL | 1 refills | Status: DC

## 2018-07-02 MED ORDER — METFORMIN HCL 500 MG PO TABS: ORAL_TABLET | ORAL | 1 refills | Status: DC

## 2018-07-02 MED ORDER — EZETIMIBE 10 MG PO TABS: ORAL_TABLET | ORAL | 1 refills | Status: DC

## 2018-07-02 MED ORDER — NATEGLINIDE 120 MG PO TABS: ORAL_TABLET | ORAL | 1 refills | Status: DC

## 2018-07-02 NOTE — Telephone Encounter (Signed)
Patient notified

## 2018-07-02 NOTE — Progress Notes (Signed)
   Subjective:    Patient ID: Deborah Gray, female    DOB: [birth year], 85 y.o.   MRN: [id number] Audio only Diabetes  She presents for her follow-up diabetic visit. She has type 2 diabetes mellitus. (125-145; pt states her sugars have been spiking, has been in 200's. Pt has been eating grapes or apples. Eating some no sugar added canned fruit. Trying to avoid heavy sugared items. )   Pt due for 6 month follow up. Pt states she broke her left wrist a while back. Pt states that husband is in [facility] and that is stressful. Pt is wanting to know if she can increase Metformin. Pt is not able to exercise like she used to. Pt sees ortho Friday.  Virtual Visit via Video Note  I connected with Deborah Gray on 07/02/18 at  1:10 PM EDT by a video enabled telemedicine application and verified that I am speaking with the correct person using two identifiers.  Location: Patient: home Provider: office   I discussed the limitations of evaluation and management by telemedicine and the availability of in person appointments. The patient expressed understanding and agreed to proceed.  History of Present Illness:    Observations/Objective:   Assessment and Plan:   Follow Up Instructions:    I discussed the assessment and treatment plan with the patient. The patient was provided an opportunity to ask questions and all were answered. The patient agreed with the plan and demonstrated an understanding of the instructions.   The patient was advised to call back or seek an in-person evaluation if the symptoms worsen or if the condition fails to improve as anticipated.  I provided 25 minutes of non-face-to-face time during this encounter.  Blood pressure medicine and blood pressure levels reviewed today with patient. Compliant with blood pressure medicine. States does not miss a dose. No obvious side effects. Blood pressure generally good when checked elsewhere. Watching salt intake.    Patient continues to take lipid medication regularly. No obvious side effects from it. Generally does not miss a dose. Prior blood work results are reviewed with patient. Patient continues to work on fat intake in diet  Patient claims compliance with diabetes medication. No obvious side effects. Reports no substantial low sugar spells. Most numbers are generally in good range when checked fasting. Generally does not miss a dose of medication. Watching diabetic diet closely  Patient going through considerable stress with husband temporarily in the nursing home.  She herself is suffered a wrist fracture and required surgery.  Vicente Males, LPN    Review of Systems No headache, no major weight loss or weight gain, no chest pain no back pain abdominal pain no change in bowel habits complete ROS otherwise negative     Objective:   Physical Exam Virtual       Assessment & Plan:  Impression 1 type 2 diabetes.  Suboptimal control.  Numbers too high.  Will increase metformin by 1 tablet to maximum 2 in the morning 2 in the evening and 1 at noon  2.  Hypertension.  Good control discussed maintain same meds  3.  Hyperlipidemia.  Prior blood work reviewed.  Good control discussed maintain same  4.  Increased stress.  Discussed at length.  Follow-up in 6 months.  Blood work then diet exercise discussed

## 2018-07-03 DIAGNOSIS — S52502D Unspecified fracture of the lower end of left radius, subsequent encounter for closed fracture with routine healing: Secondary | ICD-10-CM | POA: Diagnosis not present

## 2018-07-06 DIAGNOSIS — H34832 Tributary (branch) retinal vein occlusion, left eye, with macular edema: Secondary | ICD-10-CM | POA: Diagnosis not present

## 2018-07-10 ENCOUNTER — Ambulatory Visit (INDEPENDENT_AMBULATORY_CARE_PROVIDER_SITE_OTHER): Payer: Medicare Other | Admitting: Urology

## 2018-07-10 DIAGNOSIS — N3010 Interstitial cystitis (chronic) without hematuria: Secondary | ICD-10-CM

## 2018-07-15 ENCOUNTER — Ambulatory Visit: Payer: Medicare Other | Admitting: Student

## 2018-07-27 ENCOUNTER — Telehealth: Payer: Self-pay | Admitting: Internal Medicine

## 2018-07-27 NOTE — Telephone Encounter (Signed)
[phone] PLEASE CALL PATIENT WHEN YOU CAN, SHE IS WANTING SUGGESTIONS ON WHAT SHE CAN DO BEFORE HER UPCOMING APPOINTMENT ABOUT HER SYMPTOMS.

## 2018-07-28 NOTE — Telephone Encounter (Signed)
Communication noted.  Agree with advice provided 

## 2018-07-28 NOTE — Telephone Encounter (Signed)
Spoke wiht pt, she fell on 06/15/18, broker her arm and had surgery to repair it. Her spouse fell on 06/16/18 and pt is caring for spouse that is in hospice. Pt has had a hard time with her blood sugar being elevated reaching 169 which is high for the pt. Pt has some watery diarrhea from time to time. When pt has diarrhea, she goes a few times throughout the day after eating. Pt tries to eat well but knows some of the meals prepared for her since she broke her are isn't always what she should eat. Her Metformin medication was increased by Dr. Wolfgang Phoenix, she taking Librax as directed, an her pancreatic enzyme Creon 3 pills with meals. Pt has Levisin at home but hasn't used it. Her appointment is 10/02/2018 and she isn't sure what she can do until her appointment. Pt isn't having any nausea and reports diarrhea and spasms of the esophagus.

## 2018-07-28 NOTE — Telephone Encounter (Signed)
Avoid fatty, greasy foods. Would recommend visit this week or by latest next week. May use urgent.

## 2018-07-28 NOTE — Telephone Encounter (Signed)
Lmom, waiting on a return call.  

## 2018-07-30 ENCOUNTER — Other Ambulatory Visit: Payer: Self-pay | Admitting: Nurse Practitioner

## 2018-07-30 DIAGNOSIS — K861 Other chronic pancreatitis: Secondary | ICD-10-CM

## 2018-07-30 NOTE — Telephone Encounter (Signed)
Lmom, waiting on a return call.  

## 2018-07-31 DIAGNOSIS — S52502D Unspecified fracture of the lower end of left radius, subsequent encounter for closed fracture with routine healing: Secondary | ICD-10-CM | POA: Diagnosis not present

## 2018-08-03 NOTE — Telephone Encounter (Signed)
Left a message with a family member. They will have pt call back.

## 2018-08-04 ENCOUNTER — Telehealth: Payer: Self-pay | Admitting: Internal Medicine

## 2018-08-04 NOTE — Telephone Encounter (Signed)
Spoke with pt and she scheduled her apt.

## 2018-08-04 NOTE — Telephone Encounter (Signed)
Spoke with pt. She walked in and scheduled an appointment.

## 2018-08-04 NOTE — Telephone Encounter (Signed)
Pt called to say that she was returning a call from AM from 05/29/2022. Her husband passed away on 05-31-2022 and hasn't gotten to call nurse back yet. She said if you call back ask for her daughter in law Health visitor) and give her the message. [phone]

## 2018-08-05 ENCOUNTER — Ambulatory Visit (INDEPENDENT_AMBULATORY_CARE_PROVIDER_SITE_OTHER): Payer: Medicare Other | Admitting: Urology

## 2018-08-05 DIAGNOSIS — N3010 Interstitial cystitis (chronic) without hematuria: Secondary | ICD-10-CM | POA: Diagnosis not present

## 2018-08-11 ENCOUNTER — Other Ambulatory Visit: Payer: Self-pay | Admitting: Family Medicine

## 2018-08-14 ENCOUNTER — Encounter: Payer: Self-pay | Admitting: Nurse Practitioner

## 2018-08-14 ENCOUNTER — Other Ambulatory Visit: Payer: Self-pay

## 2018-08-14 ENCOUNTER — Ambulatory Visit (INDEPENDENT_AMBULATORY_CARE_PROVIDER_SITE_OTHER): Payer: Medicare Other | Admitting: Nurse Practitioner

## 2018-08-14 VITALS — BP 113/64 | HR 93 | Temp 97.3°F | Ht 65.0 in | Wt 140.0 lb

## 2018-08-14 DIAGNOSIS — K588 Other irritable bowel syndrome: Secondary | ICD-10-CM | POA: Diagnosis not present

## 2018-08-14 DIAGNOSIS — K861 Other chronic pancreatitis: Secondary | ICD-10-CM

## 2018-08-14 DIAGNOSIS — K219 Gastro-esophageal reflux disease without esophagitis: Secondary | ICD-10-CM

## 2018-08-14 DIAGNOSIS — E119 Type 2 diabetes mellitus without complications: Secondary | ICD-10-CM | POA: Diagnosis not present

## 2018-08-14 MED ORDER — HYOSCYAMINE SULFATE 0.125 MG SL SUBL: 0.1250 mg | SUBLINGUAL_TABLET | SUBLINGUAL | 0 refills | Status: DC | PRN

## 2018-08-14 NOTE — Assessment & Plan Note (Signed)
GERD currently doing well.  Recommend she continue her current medications and call if she has any problems.

## 2018-08-14 NOTE — Progress Notes (Signed)
Primary Care Physician:  [name], MD Primary Gastroenterologist:  Dr. Gala Romney  Chief Complaint  Patient presents with  . Diarrhea    off and on    HPI:   Deborah Gray is a 85 y.o. female who presents for diarrhea.  Patient was last seen in our office 03/03/2018 for follow-up on GERD, IBS, pancreatic insufficiency.  At that time noted doing well with GERD and IBS.  Pancreatic insufficiency symptoms quiescent on enzyme supplements PPI.  Recommend continue omeprazole daily, pancreatic enzymes, use Librax as needed, continue Benefiber, follow-up in 4 months.  Most recent colonoscopy 04/10/2016 which was essentially normal other than diverticular disease.  Recommended no further colonoscopy due to age unless symptoms develop.  Today she states she's doing ok. Her husband recently passed away from a fall/brain bleed; had been married for 61 years. Has been under a lot of stress. Also recently broke her arm in the last couple months. Has had a total of about 5 soft/loose stools, intermittently, over the past 2 months.  Still on benefiber. Still on pancrease enzyme supplementation. Has Rx for Librax which helps for upper GI symptoms, but isn't helping diarrhea at all. Has taken Levsin which works well for diarrhea. Has had increased CBG with all the stress and had increased Metformin for this. Denies hematochezia, melena, N/V, fever, chills. Has had some mild weight loss with decreased appetite around the death of her husband. Denies URI or flu-like symptoms. Denies loss of sense of taste or smell. Denies chest pain, dyspnea, dizziness, lightheadedness, syncope, near syncope. Denies any other upper or lower GI symptoms.  Is out of Levsin refills.  Past Medical History:  Diagnosis Date  . Arthritis   . Benign neoplasm of colon   . Constipation   . Diaphragmatic hernia without mention of obstruction or gangrene   . Diverticulosis of colon (without mention of hemorrhage)   .  Dysphagia, pharyngoesophageal phase   . Esophageal reflux   . Essential hypertension   . Heart murmur   . Hemorrhoids   . Hyperlipidemia   . Internal hemorrhoids without mention of complication   . Left wrist fracture   . PONV (postoperative nausea and vomiting)   . Stricture and stenosis of esophagus   . Type 2 diabetes mellitus ([place])   . Unspecified gastritis and gastroduodenitis without mention of hemorrhage     Past Surgical History:  Procedure Laterality Date  . ABDOMINAL HYSTERECTOMY    . BACTERIAL OVERGROWTH TEST N/A 09/06/2015   Procedure: BACTERIAL OVERGROWTH TEST;  Surgeon: Daneil Dolin, MD;  Location: [facility];  Service: Endoscopy;  Laterality: N/A;  800  . BREAST LUMPECTOMY Right    benign  . CATARACT EXTRACTION Bilateral 2006   Implants in both, surgeries done 6 weeks apart  . CHOLECYSTECTOMY    . COLONOSCOPY  03/08/02   [name]: diverticulosis, internal hemorrhoids, adenomatous colon polyp  . COLONOSCOPY  01/23/04   [name]: diverticulosis, internal hemorrhoids  . COLONOSCOPY  09/25/05   [name]: diverticulosis  . COLONOSCOPY  07/05/09   [name]: severe diverticulosis  . COLONOSCOPY  01/21/11   [name]: severe diverticulosis in sigmoid to desc colon, int hemorrhoids, follow up TCS in 5 years  . COLONOSCOPY N/A 04/10/2016   Procedure: COLONOSCOPY;  Surgeon: Daneil Dolin, MD;  Location: [facility];  Service: Endoscopy;  Laterality: N/A;  12:30 PM  . DILATION AND CURETTAGE OF UTERUS    . ESOPHAGEAL MANOMETRY  03/21/08   [name]: findings  c/w [place] Esophagus  . ESOPHAGOGASTRODUODENOSCOPY  03/08/02   [name]: esophageal stricture, chronic gerd, s/p dilation.   . ESOPHAGOGASTRODUODENOSCOPY  01/23/04   [name]: esophageal stricture, gastritis, hiatal hernia  . ESOPHAGOGASTRODUODENOSCOPY  09/25/05   [place]: gastritis, benign bx, no H.pylori  . ESOPHAGOGASTRODUODENOSCOPY  07/05/09   [facility]: gastropathy, benign small bowel bx and gastric bx   . ESOPHAGOGASTRODUODENOSCOPY N/A 05/15/2015   Dr. Gala Romney- diffuse moderate inflammation characterized by congestion (edema), erythema, and linear erosions was found in the entire examined stomach. bx= reactive gastropathy  . FOOT SURGERY Left   . HEMORRHOID BANDING  2017   Dr.[name]  . LAPAROSCOPIC VAGINAL HYSTERECTOMY    . ORIF WRIST FRACTURE Left 06/23/2018   Procedure: OPEN REDUCTION INTERNAL FIXATION (ORIF) LEFT WRIST FRACTURE;  Surgeon: Renette Butters, MD;  Location: [place];  Service: Orthopedics;  Laterality: Left;  regional arm block  . RECTOCELE REPAIR    . TOTAL KNEE ARTHROPLASTY Right     Current Outpatient Medications  Medication Sig Dispense Refill  . acetaminophen (TYLENOL) 500 MG tablet Take 500 mg at bedtime by mouth.     Marland Kitchen aspirin EC 81 MG tablet Take 81 mg by mouth at bedtime.    Marland Kitchen azelastine (ASTELIN) 0.1 % nasal spray Place 1 spray into both nostrils 2 (two) times daily as needed for rhinitis. 30 mL 5  . Carboxymethylcellulose Sodium (THERATEARS) 0.25 % SOLN Apply 1 drop 2 (two) times daily to eye.    . clidinium-chlordiazePOXIDE (LIBRAX) 5-2.5 MG capsule Take 1 capsule by mouth 4 (four) times daily -  before meals and at bedtime. As needed for abdominal cramps and diarrhea (Patient taking differently: Take 1 capsule by mouth as needed. As needed for abdominal cramps and diarrhea) 40 capsule 5  . ezetimibe (ZETIA) 10 MG tablet TAKE 1 TABLET BY MOUTH EVERY DAY 90 tablet 1  . guaiFENesin (MUCINEX) 600 MG 12 hr tablet Take 600 mg by mouth 2 (two) times daily as needed for cough or to loosen phlegm.     . hyoscyamine (LEVSIN SL) 0.125 MG SL tablet Place 1 tablet (0.125 mg total) under the tongue every 4 (four) hours as needed. 30 tablet 0  . losartan (COZAAR) 50 MG tablet Take 1 tablet (50 mg total) by mouth every evening. 90 tablet 1  . meclizine (ANTIVERT) 25 MG tablet TAKE 1 TABLET (25 MG TOTAL) BY MOUTH 3 (THREE) TIMES DAILY AS NEEDED FOR DIZZINESS OR  NAUSEA. 30 tablet 0  . metFORMIN (GLUCOPHAGE) 500 MG tablet TAKE ONE TABLET BY MOUTH EVERY MORNING ONE AT LUNCH AND 2 AT BEDTIME (Patient taking differently: TAKE TWO TABLETS BY MOUTH EVERY MORNING ONE AT LUNCH AND 2 AT BEDTIME) 360 tablet 0  . methenamine (HIPREX) 1 g tablet Take 1 g by mouth 2 (two) times daily.  3  . mirabegron ER (MYRBETRIQ) 25 MG TB24 tablet Take 50 mg every morning by mouth.     . Multiple Vitamins-Minerals (PRESERVISION AREDS 2) CAPS Take 1 capsule by mouth 2 (two) times daily.     . nateglinide (STARLIX) 120 MG tablet TAKE 1 TABLET BY MOUTH 3 TIMES A DAY BEFORE MEALS 270 tablet 0  . omega-3 acid ethyl esters (LOVAZA) 1 g capsule Take 4 g by mouth daily.    Marland Kitchen omeprazole (PRILOSEC) 40 MG capsule Take 1 capsule (40 mg total) by mouth daily. 30 capsule 11  . ondansetron (ZOFRAN ODT) 4 MG disintegrating tablet Take 1 tablet (4 mg total) by mouth every  8 (eight) hours as needed. 10 tablet 0  . ondansetron (ZOFRAN) 4 MG tablet Take 1 tablet (4 mg total) by mouth every 8 (eight) hours as needed for nausea or vomiting. 10 tablet 0  . Pancrelipase, Lip-Prot-Amyl, (CREON) 24000-76000 units CPEP TAKE 3 CAPSULES THREE TIMES DAILY WITH MEALS AND ONE CAPSULE WITH SNACK TWICE DAILY 990 capsule 2  . vitamin E 400 UNIT capsule Take 400 Units by mouth 4 (four) times a week. Pt takes on Tuesday, Thursday, Saturday, and Sunday.    . Vitamin Mixture (ESTER-C) 500-60 MG TABS Take 1 tablet by mouth daily.     . Wheat Dextrin (BENEFIBER DRINK MIX) PACK Take by mouth daily.     No current facility-administered medications for this visit.     Allergies as of 08/14/2018 - Review Complete 08/14/2018  Allergen Reaction Noted  . Adhesive [tape] Other (See Comments) 07/21/2016  . Estrogens Other (See Comments) 10/15/2012  . Sulfonamide derivatives Rash 03/01/2008    Family History  Problem Relation Age of Onset  . Ovarian cancer Mother   . Diabetes Father   . Heart disease Father   . Breast  cancer Sister   . Liver cancer Sister   . Colon cancer Cousin        Paternal side  . Diabetes Maternal Aunt   . Liver disease Cousin        Maternal side, never drank    Social History   Socioeconomic History  . Marital status: Married    Spouse name: Not on file  . Number of children: 3  . Years of education: Not on file  . Highest education level: Not on file  Occupational History  . Occupation: Retired  Scientific laboratory technician  . Financial resource strain: Not on file  . Food insecurity    Worry: Not on file    Inability: Not on file  . Transportation needs    Medical: Not on file    Non-medical: Not on file  Tobacco Use  . Smoking status: Never Smoker  . Smokeless tobacco: Never Used  . Tobacco comment: Never smoked  Substance and Sexual Activity  . Alcohol use: No    Alcohol/week: 0.0 standard drinks  . Drug use: No  . Sexual activity: Not on file  Lifestyle  . Physical activity    Days per week: Not on file    Minutes per session: Not on file  . Stress: Not on file  Relationships  . Social Herbalist on phone: Not on file    Gets together: Not on file    Attends religious service: Not on file    Active member of club or organization: Not on file    Attends meetings of clubs or organizations: Not on file    Relationship status: Not on file  . Intimate partner violence    Fear of current or ex partner: Not on file    Emotionally abused: Not on file    Physically abused: Not on file    Forced sexual activity: Not on file  Other Topics Concern  . Not on file  Social History Narrative  . Not on file    Review of Systems: General: Negative for anorexia, weight loss, fever, chills, fatigue, weakness. ENT: Negative for hoarseness, difficulty swallowing. CV: Negative for chest pain, angina, palpitations, peripheral edema.  Respiratory: Negative for dyspnea at rest, cough, sputum, wheezing.  GI: See history of present illness. Endo: Negative for unusual  weight change.  Heme: Negative for bruising or bleeding. Allergy: Negative for rash or hives.    Physical Exam: BP 113/64   Pulse 93   Temp (!) 97.3 F (36.3 C) (Oral)   Ht 5\' 5"  (1.651 m)   Wt 140 lb (63.5 kg)   BMI 23.30 kg/m  General:   Alert and oriented. Pleasant and cooperative. Well-nourished and well-developed.  Eyes:  Without icterus, sclera clear and conjunctiva pink.  Ears:  Normal auditory acuity. Cardiovascular:  S1, S2 present without murmurs appreciated. Extremities without clubbing or edema. Respiratory:  Clear to auscultation bilaterally. No wheezes, rales, or rhonchi. No distress.  Gastrointestinal:  +BS, soft, non-tender and non-distended. No HSM noted. No guarding or rebound. No masses appreciated.  Rectal:  Deferred  Musculoskalatal:  Symmetrical without gross deformities. Skin:  Intact without significant lesions or rashes. Neurologic:  Alert and oriented x4;  grossly normal neurologically. Psych:  Alert and cooperative. Normal mood and affect. Heme/Lymph/Immune: No excessive bruising noted.    08/14/2018 11:34 AM   Disclaimer: This note was dictated with voice recognition software. Similar sounding words can inadvertently be transcribed and may not be corrected upon review.

## 2018-08-14 NOTE — Patient Instructions (Signed)
Your health issues we discussed today were:   Diarrhea: 1. As we discussed, I think your increasing diarrhea is due to all the stress with the passing of your husband and in your arm. 2. Continue taking your current medications 3. I am sending in a refill of Levsin she can use as needed for bouts of diarrhea 4. Call us if you have any worsening or severe symptoms   Overall I recommend:  1. Continue your other current medications 2. Return for follow-up in 2 months 3. Call us if you have any questions or concerns   Because of recent events of COVID-19 ("Coronavirus"), follow CDC recommendations:  1. Wash your hand frequently 2. Avoid touching your face 3. Stay away from people who are sick 4. If you have symptoms such as fever, cough, shortness of breath then call your healthcare provider for further guidance 5. If you are sick, STAY AT HOME unless otherwise directed by your healthcare provider. 6. Follow directions from state and national officials regarding staying safe    At [facility] we value your feedback. You may receive a survey about your visit today. Please share your experience as we strive to create trusting relationships with our patients to provide genuine, compassionate, quality care.  We appreciate your understanding and patience as we review any laboratory studies, imaging, and other diagnostic tests that are ordered as we care for you. Our office policy is 5 business days for review of these results, and any emergent or urgent results are addressed in a timely manner for your best interest. If you do not hear from our office in 1 week, please contact us.   We also encourage the use of MyChart, which contains your medical information for your review as well. If you are not enrolled in this feature, an access code is on this after visit summary for your convenience. Thank you for allowing [place] to be involved in your care.  It was great to see you today!   I hope you have a great summer!!

## 2018-08-14 NOTE — Assessment & Plan Note (Signed)
History of IBS.  Has previously been on Librax.  Patient has had more frequent bouts of diarrhea which was significant stress related to fall and fracture of her arm as well as her husband's fall and subsequent death.  At this point she feels Levsin works better for her diarrhea than Librax.  She only has 5 pills left with no refills.  I will refill her Levsin for her.  Recommend she continue to take every 4 hours as needed for diarrhea.  Follow-up in 2 months.

## 2018-08-14 NOTE — Assessment & Plan Note (Signed)
History of chronic pancreatitis with pancreatic insufficiency.  She remains on pancreatic enzymes which is kept her insufficiency symptoms are managed.  Call for pancreatic insufficiency contributing to her increasing stools currently.  Recommend she continue to take pancreatic enzyme supplementation.  Follow-up in 2 months.

## 2018-08-17 ENCOUNTER — Other Ambulatory Visit: Payer: Self-pay

## 2018-08-17 ENCOUNTER — Other Ambulatory Visit: Payer: Self-pay | Admitting: Internal Medicine

## 2018-08-17 DIAGNOSIS — Z20822 Contact with and (suspected) exposure to covid-19: Secondary | ICD-10-CM

## 2018-08-17 DIAGNOSIS — R6889 Other general symptoms and signs: Secondary | ICD-10-CM | POA: Diagnosis not present

## 2018-08-17 NOTE — Progress Notes (Signed)
CC'D TO PCP °

## 2018-08-20 LAB — NOVEL CORONAVIRUS, NAA: SARS-CoV-2, NAA: NOT DETECTED

## 2018-08-24 ENCOUNTER — Telehealth: Payer: Self-pay | Admitting: Family Medicine

## 2018-08-24 DIAGNOSIS — H34832 Tributary (branch) retinal vein occlusion, left eye, with macular edema: Secondary | ICD-10-CM | POA: Diagnosis not present

## 2018-08-24 NOTE — Telephone Encounter (Signed)
negative

## 2018-08-24 NOTE — Telephone Encounter (Signed)
Patient  Is needing results of corona test today if possible before 12 noon has eye appointment at 1 pm in [place].

## 2018-08-24 NOTE — Telephone Encounter (Signed)
Pt.notified

## 2018-08-26 ENCOUNTER — Other Ambulatory Visit: Payer: Self-pay

## 2018-08-26 ENCOUNTER — Ambulatory Visit (INDEPENDENT_AMBULATORY_CARE_PROVIDER_SITE_OTHER): Payer: Medicare Other | Admitting: Family Medicine

## 2018-08-26 DIAGNOSIS — E1165 Type 2 diabetes mellitus with hyperglycemia: Secondary | ICD-10-CM

## 2018-08-26 MED ORDER — GLIPIZIDE ER 2.5 MG PO TB24: 2.5000 mg | ORAL_TABLET | Freq: Every day | ORAL | 11 refills | Status: DC

## 2018-08-26 NOTE — Progress Notes (Signed)
   Subjective:  Audio:  Patient ID: Deborah Gray, female    DOB: [birth year], 85 y.o.   MRN: [id number]  HPI  Patient calls to discuss sugar readings. Patient states her sugars have been running high since she broke her arm. Her sugars have been running 160s and 170s. Today her sugar was 126. Patient also states she was having weakness in her legs and her specialist states he felt the weakness and elevated sugars were most likely related to stress of breaking arm and losing her husband.  Virtual Visit via Video Note  I connected with Deborah Gray on 08/26/18 at 11:00 AM EDT by a video enabled telemedicine application and verified that I am speaking with the correct person using two identifiers.  Location: Patient: home Provider: office   I discussed the limitations of evaluation and management by telemedicine and the availability of in person appointments. The patient expressed understanding and agreed to proceed.  History of Present Illness:    Observations/Objective:   Assessment and Plan:   Follow Up Instructions:    I discussed the assessment and treatment plan with the patient. The patient was provided an opportunity to ask questions and all were answered. The patient agreed with the plan and demonstrated an understanding of the instructions.   The patient was advised to call back or seek an in-person evaluation if the symptoms worsen or if the condition fails to improve as anticipated.  I provided 18 minutes of non-face-to-face time during this encounter.  Patient is noted sugar drifting up over the last several months.  A lot of stress.  Spouse recently passed 1.  Compliant with medications.  Diet overall decent and stable   Review of Systems No headache, no major weight loss or weight gain, no chest pain no back pain abdominal pain no change in bowel habits complete ROS otherwise negative     Objective:   Physical Exam  Virtual      Assessment &  Plan:  Impression type 2 diabetes.  Control suboptimal discussed will adjust medicine please see medication orders follow-up as scheduled

## 2018-08-29 DIAGNOSIS — N3001 Acute cystitis with hematuria: Secondary | ICD-10-CM | POA: Diagnosis not present

## 2018-08-29 DIAGNOSIS — R300 Dysuria: Secondary | ICD-10-CM | POA: Diagnosis not present

## 2018-08-31 ENCOUNTER — Telehealth: Payer: Self-pay | Admitting: Family Medicine

## 2018-08-31 NOTE — Telephone Encounter (Signed)
Patient mailed in life insurance form to be completed on her husband curtis Social research officer, government in your box.

## 2018-09-09 ENCOUNTER — Ambulatory Visit: Payer: Medicare Other | Admitting: Urology

## 2018-09-10 NOTE — Telephone Encounter (Signed)
Left message to return call yesterday. Pt son called back today. Informed son that provider will be working on this form over the weekend. Pt son verbalized understanding

## 2018-09-15 ENCOUNTER — Ambulatory Visit (INDEPENDENT_AMBULATORY_CARE_PROVIDER_SITE_OTHER): Payer: Medicare Other | Admitting: Urology

## 2018-09-15 DIAGNOSIS — N3010 Interstitial cystitis (chronic) without hematuria: Secondary | ICD-10-CM

## 2018-09-16 DIAGNOSIS — M25562 Pain in left knee: Secondary | ICD-10-CM | POA: Diagnosis not present

## 2018-09-16 DIAGNOSIS — M1712 Unilateral primary osteoarthritis, left knee: Secondary | ICD-10-CM | POA: Diagnosis not present

## 2018-09-24 DIAGNOSIS — E119 Type 2 diabetes mellitus without complications: Secondary | ICD-10-CM | POA: Diagnosis not present

## 2018-09-24 NOTE — Telephone Encounter (Signed)
FYI--This is the second time patient has called  checking on insurance form that was sent back 7/13 she is needing it as soon as possible to send in to the insurance company. And also she is needing her diabetic supplies switched to [facility] [address] due to her insurance they are the only ones that will cover her diabetic supplies not needing them until September.

## 2018-09-25 DIAGNOSIS — E119 Type 2 diabetes mellitus without complications: Secondary | ICD-10-CM | POA: Diagnosis not present

## 2018-09-25 MED ORDER — BLOOD GLUCOSE MONITOR KIT: PACK | 0 refills | Status: DC

## 2018-09-25 NOTE — Telephone Encounter (Signed)
Call pt, this is a legtal document requ9ring me to read all the hospital notes at great length will be ready Monday morning. Do diab supplies as requested

## 2018-09-25 NOTE — Telephone Encounter (Signed)
Patient notified forms will be ready Monday. Prescription for diabetic testing supplies faxed to [facility] on [address]

## 2018-10-02 ENCOUNTER — Other Ambulatory Visit: Payer: Self-pay

## 2018-10-02 ENCOUNTER — Ambulatory Visit (INDEPENDENT_AMBULATORY_CARE_PROVIDER_SITE_OTHER): Payer: Medicare Other | Admitting: Internal Medicine

## 2018-10-02 ENCOUNTER — Encounter: Payer: Self-pay | Admitting: Internal Medicine

## 2018-10-02 VITALS — BP 127/65 | HR 86 | Temp 97.7°F | Ht 64.0 in | Wt 137.6 lb

## 2018-10-02 DIAGNOSIS — K8689 Other specified diseases of pancreas: Secondary | ICD-10-CM | POA: Diagnosis not present

## 2018-10-02 DIAGNOSIS — K219 Gastro-esophageal reflux disease without esophagitis: Secondary | ICD-10-CM | POA: Diagnosis not present

## 2018-10-02 DIAGNOSIS — K582 Mixed irritable bowel syndrome: Secondary | ICD-10-CM | POA: Diagnosis not present

## 2018-10-02 NOTE — Patient Instructions (Addendum)
Continue omeprazole and pancreatic enzymes without change   Continue to use Levsin as needed for occasional diarrhea.  Continue Benefiber daily  See Dr. Wolfgang Phoenix regarding hoarseness  Office visit in 6 months

## 2018-10-02 NOTE — Progress Notes (Signed)
Primary Care Physician:  [name], MD Primary Gastroenterologist:  Dr. Gala Romney  Pre-Procedure History & Physical: HPI:  Deborah Gray is a 85 y.o. female here for for follow-up of IBS, GERD and pancreatic exocrine insufficiency.  She has been compliant with PPI and pancreatic enzymes.  Since her last visit here she was found to have a UTI at the urgent care center.  She attributed this to cause her blood sugars are running.  Also she noted looser stools on higher dose of metformin.  She has cut back to metformin 500 mg twice daily.  She tells me her diarrhea is essentially resolved.  She is down 2 pounds since her last visit.  She is continuing to deal with the loss of her husband recently after 15 years of marriage.  GERD well-controlled on omeprazole. She has not had any rectal bleeding and no dysphagia. Takes an occasional Levsin for infrequent bouts of abdominal cramps and diarrhea.  Takes Benefiber daily. She reports some nasal drainage and hoarseness from time to time.   Past Medical History:  Diagnosis Date  . Arthritis   . Benign neoplasm of colon   . Constipation   . Diaphragmatic hernia without mention of obstruction or gangrene   . Diverticulosis of colon (without mention of hemorrhage)   . Dysphagia, pharyngoesophageal phase   . Esophageal reflux   . Essential hypertension   . Heart murmur   . Hemorrhoids   . Hyperlipidemia   . Internal hemorrhoids without mention of complication   . Left wrist fracture   . PONV (postoperative nausea and vomiting)   . Stricture and stenosis of esophagus   . Type 2 diabetes mellitus ([place])   . Unspecified gastritis and gastroduodenitis without mention of hemorrhage     Past Surgical History:  Procedure Laterality Date  . ABDOMINAL HYSTERECTOMY    . BACTERIAL OVERGROWTH TEST N/A 09/06/2015   Procedure: BACTERIAL OVERGROWTH TEST;  Surgeon: Daneil Dolin, MD;  [facility]: [facility];  Service: Endoscopy;  Laterality: N/A;   800  . BREAST LUMPECTOMY Right    benign  . CATARACT EXTRACTION Bilateral 2006   Implants in both, surgeries done 6 weeks apart  . CHOLECYSTECTOMY    . COLONOSCOPY  03/08/02   [name]: diverticulosis, internal hemorrhoids, adenomatous colon polyp  . COLONOSCOPY  01/23/04   [name]: diverticulosis, internal hemorrhoids  . COLONOSCOPY  09/25/05   [name]: diverticulosis  . COLONOSCOPY  07/05/09   [name]: severe diverticulosis  . COLONOSCOPY  01/21/11   [name]: severe diverticulosis in sigmoid to desc colon, int hemorrhoids, follow up TCS in 5 years  . COLONOSCOPY N/A 04/10/2016   Procedure: COLONOSCOPY;  Surgeon: Daneil Dolin, MD;  Location: [facility];  Service: Endoscopy;  Laterality: N/A;  12:30 PM  . DILATION AND CURETTAGE OF UTERUS    . ESOPHAGEAL MANOMETRY  03/21/08   Patterson: findings c/w [place] Esophagus  . ESOPHAGOGASTRODUODENOSCOPY  03/08/02   [name]: esophageal stricture, chronic gerd, s/p dilation.   . ESOPHAGOGASTRODUODENOSCOPY  01/23/04   [name]: esophageal stricture, gastritis, hiatal hernia  . ESOPHAGOGASTRODUODENOSCOPY  09/25/05   Patterson: gastritis, benign bx, no H.pylori  . ESOPHAGOGASTRODUODENOSCOPY  07/05/09   Patterson: gastropathy, benign small bowel bx and gastric bx  . ESOPHAGOGASTRODUODENOSCOPY N/A 05/15/2015   Dr. Gala Romney- diffuse moderate inflammation characterized by congestion (edema), erythema, and linear erosions was found in the entire examined stomach. bx= reactive gastropathy  . FOOT SURGERY Left   . HEMORRHOID BANDING  2017  Dr.Mickle Campton  . LAPAROSCOPIC VAGINAL HYSTERECTOMY    . ORIF WRIST FRACTURE Left 06/23/2018   Procedure: OPEN REDUCTION INTERNAL FIXATION (ORIF) LEFT WRIST FRACTURE;  Surgeon: Renette Butters, MD;  Location: [facility];  Service: Orthopedics;  Laterality: Left;  regional arm block  . RECTOCELE REPAIR    . TOTAL KNEE ARTHROPLASTY Right     Prior to Admission medications   Medication Sig Start Date  End Date Taking? Authorizing Provider  acetaminophen (TYLENOL) 500 MG tablet Take 500 mg at bedtime by mouth.    Yes [provider]  aspirin EC 81 MG tablet Take 81 mg by mouth at bedtime.   Yes [provider]  azelastine (ASTELIN) 0.1 % nasal spray Place 1 spray into both nostrils 2 (two) times daily as needed for rhinitis. 01/08/18  Yes [name], MD  blood glucose meter kit and supplies KIT Dispense based on  insurance preference. Tests once a day E11.9 09/25/18  Yes [name], MD  Carboxymethylcellulose Sodium (THERATEARS) 0.25 % SOLN Apply 1 drop 2 (two) times daily to eye.   Yes [provider]  clidinium-chlordiazePOXIDE (LIBRAX) 5-2.5 MG capsule Take 1 capsule by mouth 4 (four) times daily -  before meals and at bedtime. As needed for abdominal cramps and diarrhea Patient taking differently: Take 1 capsule by mouth as needed. As needed for abdominal cramps and diarrhea 01/23/18  Yes Annitta Needs, NP  ezetimibe (ZETIA) 10 MG tablet TAKE 1 TABLET BY MOUTH EVERY DAY 07/02/18  Yes Luking, Elayne Snare, MD  guaiFENesin (MUCINEX) 600 MG 12 hr tablet Take 600 mg by mouth 3 (three) times daily.    Yes [provider]  hyoscyamine (LEVSIN SL) 0.125 MG SL tablet Place 1 tablet (0.125 mg total) under the tongue every 4 (four) hours as needed. Patient taking differently: Place 0.125 mg under the tongue as needed.  08/14/18  Yes Carlis Stable, NP  losartan (COZAAR) 50 MG tablet Take 1 tablet (50 mg total) by mouth every evening. 07/02/18  Yes Luking, Elayne Snare, MD  meclizine (ANTIVERT) 25 MG tablet TAKE 1 TABLET (25 MG TOTAL) BY MOUTH 3 (THREE) TIMES DAILY AS NEEDED FOR DIZZINESS OR NAUSEA. Patient taking differently: as needed. TAKE 1 TABLET (25 MG TOTAL) BY MOUTH 3 (THREE) TIMES DAILY AS NEEDED FOR DIZZINESS OR NAUSEA. 01/08/18  Yes [name], MD  metFORMIN (GLUCOPHAGE) 500 MG tablet TAKE ONE TABLET BY MOUTH EVERY MORNING ONE AT LUNCH AND 2 AT BEDTIME  08/11/18  Yes [name], MD  methenamine (HIPREX) 1 g tablet Take 1 g by mouth 2 (two) times daily. 10/08/17  Yes [provider]  mirabegron ER (MYRBETRIQ) 25 MG TB24 tablet Take 50 mg every morning by mouth.    Yes [provider]  Multiple Vitamins-Minerals (PRESERVISION AREDS 2) CAPS Take 1 capsule by mouth 2 (two) times daily.    Yes [provider]  nateglinide (STARLIX) 120 MG tablet TAKE 1 TABLET BY MOUTH 3 TIMES A DAY BEFORE MEALS 08/11/18  Yes [name], MD  omega-3 acid ethyl esters (LOVAZA) 1 g capsule Take 3 g by mouth daily.    Yes [provider]  omeprazole (PRILOSEC) 40 MG capsule Take 1 capsule (40 mg total) by mouth daily. 12/16/17  Yes Nicolis Boody, Cristopher Estimable, MD  Pancrelipase, Lip-Prot-Amyl, (CREON) 24000-76000 units CPEP TAKE 3 CAPSULES THREE TIMES DAILY WITH MEALS AND ONE CAPSULE WITH SNACK TWICE DAILY 07/30/18  Yes Roseanne Kaufman  W, NP  vitamin E 400 UNIT capsule Take 400 Units by mouth 4 (four) times a week. Pt takes on Tuesday, Thursday, Saturday, and Sunday.   Yes [provider]  Vitamin Mixture (ESTER-C) 500-60 MG TABS Take 1 tablet by mouth daily.    Yes [provider]  Wheat Dextrin (BENEFIBER DRINK MIX) PACK Take by mouth daily.   Yes [provider]  glipiZIDE (GLUCOTROL XL) 2.5 MG 24 hr tablet Take 1 tablet (2.5 mg total) by mouth daily with breakfast. Patient not taking: Reported on 10/02/2018 08/26/18   [name], MD  ondansetron (ZOFRAN ODT) 4 MG disintegrating tablet Take 1 tablet (4 mg total) by mouth every 8 (eight) hours as needed. Patient not taking: Reported on 10/02/2018 06/15/18   Isla Pence, MD  ondansetron (ZOFRAN) 4 MG tablet Take 1 tablet (4 mg total) by mouth every 8 (eight) hours as needed for nausea or vomiting. Patient not taking: Reported on 10/02/2018 06/23/18   Prudencio Burly III, PA-C    Allergies as of 10/02/2018 - Review Complete 10/02/2018  Allergen Reaction  Noted  . Adhesive [tape] Other (See Comments) 07/21/2016  . Estrogens Other (See Comments) 10/15/2012  . Sulfonamide derivatives Rash 03/01/2008    Family History  Problem Relation Age of Onset  . Ovarian cancer Mother   . Diabetes Father   . Heart disease Father   . Breast cancer Sister   . Liver cancer Sister   . Colon cancer Cousin        Paternal side  . Diabetes Maternal Aunt   . Liver disease Cousin        Maternal side, never drank    Social History   Socioeconomic History  . Marital status: Married    Spouse name: Not on file  . Number of children: 3  . Years of education: Not on file  . Highest education level: Not on file  Occupational History  . Occupation: Retired  Scientific laboratory technician  . Financial resource strain: Not on file  . Food insecurity    Worry: Not on file    Inability: Not on file  . Transportation needs    Medical: Not on file    Non-medical: Not on file  Tobacco Use  . Smoking status: Never Smoker  . Smokeless tobacco: Never Used  . Tobacco comment: Never smoked  Substance and Sexual Activity  . Alcohol use: No    Alcohol/week: 0.0 standard drinks  . Drug use: No  . Sexual activity: Not on file  Lifestyle  . Physical activity    Days per week: Not on file    Minutes per session: Not on file  . Stress: Not on file  Relationships  . Social Herbalist on phone: Not on file    Gets together: Not on file    Attends religious service: Not on file    Active member of club or organization: Not on file    Attends meetings of clubs or organizations: Not on file    Relationship status: Not on file  . Intimate partner violence    Fear of current or ex partner: Not on file    Emotionally abused: Not on file    Physically abused: Not on file    Forced sexual activity: Not on file  Other Topics Concern  . Not on file  Social History Narrative  . Not on file    Review of Systems: See HPI, otherwise negative ROS  Physical Exam: BP  127/65   Pulse 86   Temp 97.7 F (36.5 C)   Ht '5\' 4"'$  (1.626 m)   Wt 137 lb 9.6 oz (62.4 kg)   BMI 23.62 kg/m  General:   Alert,  pleasant and cooperative in NAD Lungs:  Clear throughout to auscultation.   No wheezes, crackles, or rhonchi. No acute distress. Heart:  Regular rate and rhythm; no murmurs, clicks, rubs,  or gallops. Abdomen: Non-distended, normal bowel sounds.  Soft and nontender without appreciable mass or hepatosplenomegaly.  Pulses:  Normal pulses noted. Extremities:  Without clubbing or edema.  Impression/Plan: 85 year old lady with pancreatic exocrine deficiency, IBS and GERD.  Relatively quiescent  at this time.  Increased loose stools with escalated doses of metformin may likely treat.  On her own, she is cut back on the dose of metformin. She does not have any alarm symptoms at this time.  All in all, I feel she is doing well.  She continues to deal with the loss of her husband after 68 years of marriage.  Recommendations:  Continue omeprazole and pancreatic enzymes without change   Continue to use Levsin as needed for occasional diarrhea.  Continue Benefiber daily  See Dr. Wolfgang Phoenix regarding hoarseness and diabetes management.  Office visit in 6 months     Notice: This dictation was prepared with Dragon dictation along with smaller phrase technology. Any transcriptional errors that result from this process are unintentional and may not be corrected upon review.

## 2018-10-05 DIAGNOSIS — H34832 Tributary (branch) retinal vein occlusion, left eye, with macular edema: Secondary | ICD-10-CM | POA: Diagnosis not present

## 2018-10-21 ENCOUNTER — Ambulatory Visit (INDEPENDENT_AMBULATORY_CARE_PROVIDER_SITE_OTHER): Payer: Medicare Other | Admitting: Urology

## 2018-10-21 DIAGNOSIS — N3010 Interstitial cystitis (chronic) without hematuria: Secondary | ICD-10-CM | POA: Diagnosis not present

## 2018-11-08 ENCOUNTER — Other Ambulatory Visit: Payer: Self-pay | Admitting: Family Medicine

## 2018-11-18 ENCOUNTER — Ambulatory Visit (INDEPENDENT_AMBULATORY_CARE_PROVIDER_SITE_OTHER): Payer: Medicare Other | Admitting: Urology

## 2018-11-18 DIAGNOSIS — N3020 Other chronic cystitis without hematuria: Secondary | ICD-10-CM | POA: Diagnosis not present

## 2018-11-18 DIAGNOSIS — N3010 Interstitial cystitis (chronic) without hematuria: Secondary | ICD-10-CM | POA: Diagnosis not present

## 2018-11-18 DIAGNOSIS — N3281 Overactive bladder: Secondary | ICD-10-CM

## 2018-11-23 DIAGNOSIS — H34832 Tributary (branch) retinal vein occlusion, left eye, with macular edema: Secondary | ICD-10-CM | POA: Diagnosis not present

## 2018-11-26 ENCOUNTER — Ambulatory Visit (INDEPENDENT_AMBULATORY_CARE_PROVIDER_SITE_OTHER): Payer: Medicare Other | Admitting: Family Medicine

## 2018-11-26 ENCOUNTER — Other Ambulatory Visit (HOSPITAL_COMMUNITY): Payer: Self-pay | Admitting: Family Medicine

## 2018-11-26 DIAGNOSIS — J019 Acute sinusitis, unspecified: Secondary | ICD-10-CM

## 2018-11-26 DIAGNOSIS — Z1231 Encounter for screening mammogram for malignant neoplasm of breast: Secondary | ICD-10-CM

## 2018-11-26 DIAGNOSIS — B9689 Other specified bacterial agents as the cause of diseases classified elsewhere: Secondary | ICD-10-CM | POA: Diagnosis not present

## 2018-11-26 MED ORDER — AMOXICILLIN 500 MG PO CAPS: 500.0000 mg | ORAL_CAPSULE | Freq: Three times a day (TID) | ORAL | 0 refills | Status: DC

## 2018-11-26 NOTE — Progress Notes (Signed)
   Subjective:  Audio only  Patient ID: Deborah Gray, female    DOB: [birth year], 85 y.o.   MRN: [id number]  HPI  Patient calls with green mucus off and on for a year- also having hoarseness for months. Patient states the mucus is like glue and she has to use mucinex 600mg  3 times a day everyday to get the mucus out.  Virtual Visit via Video Note  I connected with Deborah Gray on 11/26/18 at  3:50 PM EDT by a video enabled telemedicine application and verified that I am speaking with the correct person using two identifiers.  Location: Patient: home Provider: office   I discussed the limitations of evaluation and management by telemedicine and the availability of in person appointments. The patient expressed understanding and agreed to proceed.  History of Present Illness:    Observations/Objective:   Assessment and Plan:   Follow Up Instructions:    I discussed the assessment and treatment plan with the patient. The patient was provided an opportunity to ask questions and all were answered. The patient agreed with the plan and demonstrated an understanding of the instructions.   The patient was advised to call back or seek an in-person evaluation if the symptoms worsen or if the condition fails to improve as anticipated.  I provided minutes of non-face-to-face time during this encounter.     Review of Systems No headache, no major weight loss or weight gain, no chest pain no back pain abdominal pain no change in bowel habits complete ROS otherwise negative     Objective:   Physical Exam  Virtual      Assessment & Plan:  Impression subacute rhinosinusitis.  Antibiotics prescribed.  Symptom care discussed.  Warning signs discussed.  Also discussion held regarding patient's ongoing challenges having recently been widowed

## 2018-11-27 ENCOUNTER — Other Ambulatory Visit: Payer: Medicare Other

## 2018-11-29 ENCOUNTER — Encounter: Payer: Self-pay | Admitting: Family Medicine

## 2018-12-08 ENCOUNTER — Telehealth: Payer: Self-pay | Admitting: Family Medicine

## 2018-12-08 ENCOUNTER — Other Ambulatory Visit: Payer: Self-pay | Admitting: *Deleted

## 2018-12-08 DIAGNOSIS — E7800 Pure hypercholesterolemia, unspecified: Secondary | ICD-10-CM

## 2018-12-08 DIAGNOSIS — Z79899 Other long term (current) drug therapy: Secondary | ICD-10-CM

## 2018-12-08 DIAGNOSIS — I10 Essential (primary) hypertension: Secondary | ICD-10-CM

## 2018-12-08 DIAGNOSIS — E119 Type 2 diabetes mellitus without complications: Secondary | ICD-10-CM

## 2018-12-08 MED ORDER — ISOMETHEPTENE-DICHLORAL-APAP 65-100-325 MG PO CAPS: ORAL_CAPSULE | ORAL | 2 refills | Status: DC

## 2018-12-08 NOTE — Telephone Encounter (Signed)
Orders ready and pt notified

## 2018-12-08 NOTE — Telephone Encounter (Signed)
Script printed and waiting for dr signature then will fax and call pt

## 2018-12-08 NOTE — Telephone Encounter (Signed)
Met liv li p A1c

## 2018-12-08 NOTE — Telephone Encounter (Signed)
Last a1c 12/31/17 lipid, a1c

## 2018-12-08 NOTE — Telephone Encounter (Signed)
Pt states she tried to call pharm to get a refill on midrin because she has been having a headache off and on since last night. Pharm told her to call pcp because refills expired in 2018.I do not see this med on her list or her med history. So if prescribing please give dose and directions.  Not severe, pain left temple. Tried excedrin and migraine. Was taking mucinex 600mg  tid and just finished amoxil and she states sinus congestion is better. It is not green anymore.   [facility] [address]

## 2018-12-08 NOTE — Telephone Encounter (Signed)
Patient has 6 month follow up in November and would like to get her labs done.

## 2018-12-08 NOTE — Telephone Encounter (Signed)
Didn't even know still around thirty one or two q six hrs prn h a two ref

## 2018-12-09 ENCOUNTER — Ambulatory Visit (INDEPENDENT_AMBULATORY_CARE_PROVIDER_SITE_OTHER): Payer: Medicare Other | Admitting: Urology

## 2018-12-09 ENCOUNTER — Other Ambulatory Visit (HOSPITAL_COMMUNITY)
Admission: RE | Admit: 2018-12-09 | Discharge: 2018-12-09 | Disposition: A | Discharge status: Home / Self Care | Payer: Medicare Other | Source: Ambulatory Visit | Attending: Urology | Admitting: Urology

## 2018-12-09 DIAGNOSIS — N3011 Interstitial cystitis (chronic) with hematuria: Secondary | ICD-10-CM | POA: Insufficient documentation

## 2018-12-09 DIAGNOSIS — N3020 Other chronic cystitis without hematuria: Secondary | ICD-10-CM | POA: Diagnosis not present

## 2018-12-09 NOTE — Telephone Encounter (Signed)
Prescription was faxed to pharmacy. Left message to return call to notify patient.

## 2018-12-10 LAB — URINE CULTURE: Culture: <10000 — AB

## 2018-12-10 NOTE — Telephone Encounter (Signed)
Pt.notified

## 2018-12-11 ENCOUNTER — Other Ambulatory Visit: Payer: Self-pay

## 2018-12-12 ENCOUNTER — Other Ambulatory Visit (INDEPENDENT_AMBULATORY_CARE_PROVIDER_SITE_OTHER): Payer: Medicare Other | Admitting: *Deleted

## 2018-12-12 DIAGNOSIS — Z23 Encounter for immunization: Secondary | ICD-10-CM

## 2018-12-14 ENCOUNTER — Ambulatory Visit (HOSPITAL_COMMUNITY)
Admission: RE | Admit: 2018-12-14 | Discharge: 2018-12-14 | Disposition: A | Discharge status: Home / Self Care | Payer: Medicare Other | Source: Ambulatory Visit | Attending: Family Medicine | Admitting: Family Medicine

## 2018-12-14 ENCOUNTER — Other Ambulatory Visit (HOSPITAL_COMMUNITY): Payer: Self-pay | Admitting: Family Medicine

## 2018-12-14 ENCOUNTER — Other Ambulatory Visit: Payer: Self-pay

## 2018-12-14 DIAGNOSIS — Z79899 Other long term (current) drug therapy: Secondary | ICD-10-CM | POA: Diagnosis not present

## 2018-12-14 DIAGNOSIS — I10 Essential (primary) hypertension: Secondary | ICD-10-CM | POA: Diagnosis not present

## 2018-12-14 DIAGNOSIS — Z1231 Encounter for screening mammogram for malignant neoplasm of breast: Secondary | ICD-10-CM | POA: Insufficient documentation

## 2018-12-14 DIAGNOSIS — E7800 Pure hypercholesterolemia, unspecified: Secondary | ICD-10-CM | POA: Diagnosis not present

## 2018-12-14 DIAGNOSIS — R921 Mammographic calcification found on diagnostic imaging of breast: Secondary | ICD-10-CM

## 2018-12-14 DIAGNOSIS — E119 Type 2 diabetes mellitus without complications: Secondary | ICD-10-CM | POA: Diagnosis not present

## 2018-12-15 LAB — BASIC METABOLIC PANEL
BUN/Creatinine Ratio: 28 (ref 12–28)
BUN: 22 mg/dL (ref 8–27)
CO2: 27 mmol/L (ref 20–29)
Calcium: 10.5 mg/dL — ABNORMAL HIGH (ref 8.7–10.3)
Chloride: 102 mmol/L (ref 96–106)
Creatinine, Ser: 0.79 mg/dL (ref 0.57–1.00)
GFR calc Af Amer: 80 mL/min/{1.73_m2} (ref >59)
GFR calc non Af Amer: 69 mL/min/{1.73_m2} (ref >59)
Glucose: 145 mg/dL — ABNORMAL HIGH (ref 65–99)
Potassium: 5.1 mmol/L (ref 3.5–5.2)
Sodium: 142 mmol/L (ref 134–144)

## 2018-12-15 LAB — HEMOGLOBIN A1C
Est. average glucose Bld gHb Est-mCnc: 134 mg/dL
Hgb A1c MFr Bld: 6.3 % — ABNORMAL HIGH (ref 4.8–5.6)

## 2018-12-15 LAB — LIPID PANEL
Chol/HDL Ratio: 2.3 ratio (ref 0.0–4.4)
Cholesterol, Total: 177 mg/dL (ref 100–199)
HDL: 76 mg/dL (ref >39)
LDL Chol Calc (NIH): 88 mg/dL (ref 0–99)
Triglycerides: 71 mg/dL (ref 0–149)
VLDL Cholesterol Cal: 13 mg/dL (ref 5–40)

## 2018-12-15 LAB — HEPATIC FUNCTION PANEL
ALT: 12 IU/L (ref 0–32)
AST: 11 IU/L (ref 0–40)
Albumin: 4.8 g/dL — ABNORMAL HIGH (ref 3.6–4.6)
Alkaline Phosphatase: 86 IU/L (ref 39–117)
Bilirubin Total: 0.4 mg/dL (ref 0.0–1.2)
Bilirubin, Direct: 0.12 mg/dL (ref 0.00–0.40)
Total Protein: 7 g/dL (ref 6.0–8.5)

## 2018-12-16 ENCOUNTER — Ambulatory Visit (INDEPENDENT_AMBULATORY_CARE_PROVIDER_SITE_OTHER): Payer: Medicare Other | Admitting: Urology

## 2018-12-16 DIAGNOSIS — N3010 Interstitial cystitis (chronic) without hematuria: Secondary | ICD-10-CM | POA: Diagnosis not present

## 2018-12-22 ENCOUNTER — Ambulatory Visit (HOSPITAL_COMMUNITY): Admission: RE | Admit: 2018-12-22 | Payer: Medicare Other | Source: Ambulatory Visit

## 2018-12-22 ENCOUNTER — Other Ambulatory Visit (HOSPITAL_COMMUNITY): Payer: Self-pay | Admitting: Family Medicine

## 2018-12-22 ENCOUNTER — Ambulatory Visit (HOSPITAL_COMMUNITY)
Admission: RE | Admit: 2018-12-22 | Discharge: 2018-12-22 | Disposition: A | Discharge status: Home / Self Care | Payer: Medicare Other | Source: Ambulatory Visit | Attending: Family Medicine | Admitting: Family Medicine

## 2018-12-22 ENCOUNTER — Other Ambulatory Visit: Payer: Self-pay

## 2018-12-22 DIAGNOSIS — R928 Other abnormal and inconclusive findings on diagnostic imaging of breast: Secondary | ICD-10-CM

## 2018-12-22 DIAGNOSIS — R922 Inconclusive mammogram: Secondary | ICD-10-CM | POA: Diagnosis not present

## 2018-12-22 DIAGNOSIS — R921 Mammographic calcification found on diagnostic imaging of breast: Secondary | ICD-10-CM | POA: Diagnosis not present

## 2018-12-23 DIAGNOSIS — I471 Supraventricular tachycardia: Secondary | ICD-10-CM | POA: Insufficient documentation

## 2018-12-23 NOTE — Progress Notes (Signed)
Cardiology Office Note    Date:  12/28/2018   ID:  TSION INGHRAM, DOB 02/05/1934, MRN [id number]  PCP:  [name], MD  Cardiologist: Kate Sable, MD EPS: None  Chief Complaint  Patient presents with  . Follow-up    History of Present Illness:  Deborah Gray is a 85 y.o. female with history of HTN, atypical chest pain with negative stress test in 2006, 2015 in 2017, long history of GI and epigastric pain, echo in 2015 LVEF 65 to 70% mild LVH and grade 1 DD.SVT in 2019 treated with beta blocker but stopped at f/u visit 11/2017  Patient says she's been unsteady since she turned 51. Broke her arm in April. She tripped over a chair last Monday and fell. Having arm, back, and hip pain.Husband died of subdural hematoma in 2022-08-20 after a fall. Has had a lot to deal with. Denies chest pain, has rare palpitations when overly tired.  Occasional edema if eats too much salt. Overall no significant cardiac complaints.   Past Medical History:  Diagnosis Date  . Arthritis   . Benign neoplasm of colon   . Constipation   . Diaphragmatic hernia without mention of obstruction or gangrene   . Diverticulosis of colon (without mention of hemorrhage)   . Dysphagia, pharyngoesophageal phase   . Esophageal reflux   . Essential hypertension   . Heart murmur   . Hemorrhoids   . Hyperlipidemia   . Internal hemorrhoids without mention of complication   . Left wrist fracture   . PONV (postoperative nausea and vomiting)   . Stricture and stenosis of esophagus   . Type 2 diabetes mellitus ([place])   . Unspecified gastritis and gastroduodenitis without mention of hemorrhage     Past Surgical History:  Procedure Laterality Date  . ABDOMINAL HYSTERECTOMY    . BACTERIAL OVERGROWTH TEST N/A 09/06/2015   Procedure: BACTERIAL OVERGROWTH TEST;  Surgeon: Daneil Dolin, MD;  [facility];  Service: Endoscopy;  Laterality: N/A;  800  . BREAST LUMPECTOMY Right    benign  . CATARACT  EXTRACTION Bilateral 2006   Implants in both, surgeries done 6 weeks apart  . CHOLECYSTECTOMY    . COLONOSCOPY  03/08/02   [name]: diverticulosis, internal hemorrhoids, adenomatous colon polyp  . COLONOSCOPY  01/23/04   [name]: diverticulosis, internal hemorrhoids  . COLONOSCOPY  09/25/05   [name]: diverticulosis  . COLONOSCOPY  07/05/09   [name]: severe diverticulosis  . COLONOSCOPY  01/21/11   [name]: severe diverticulosis in sigmoid to desc colon, int hemorrhoids, follow up TCS in 5 years  . COLONOSCOPY N/A 04/10/2016   Procedure: COLONOSCOPY;  Surgeon: Daneil Dolin, MD;  [facility];  Service: Endoscopy;  Laterality: N/A;  12:30 PM  . DILATION AND CURETTAGE OF UTERUS    . ESOPHAGEAL MANOMETRY  03/21/08   [facility]: findings c/w [place] Esophagus  . ESOPHAGOGASTRODUODENOSCOPY  03/08/02   [name]: esophageal stricture, chronic gerd, s/p dilation.   . ESOPHAGOGASTRODUODENOSCOPY  01/23/04   [name]: esophageal stricture, gastritis, hiatal hernia  . ESOPHAGOGASTRODUODENOSCOPY  09/25/05   [facility]: gastritis, benign bx, no H.pylori  . ESOPHAGOGASTRODUODENOSCOPY  07/05/09   [facility]: gastropathy, benign small bowel bx and gastric bx  . ESOPHAGOGASTRODUODENOSCOPY N/A 05/15/2015   Dr. Gala Romney- diffuse moderate inflammation characterized by congestion (edema), erythema, and linear erosions was found in the entire examined stomach. bx= reactive gastropathy  . FOOT SURGERY Left   . HEMORRHOID BANDING  2017   Dr.[name]  .  LAPAROSCOPIC VAGINAL HYSTERECTOMY    . ORIF WRIST FRACTURE Left 06/23/2018   Procedure: OPEN REDUCTION INTERNAL FIXATION (ORIF) LEFT WRIST FRACTURE;  Surgeon: Renette Butters, MD;  Location: [place];  Service: Orthopedics;  Laterality: Left;  regional arm block  . RECTOCELE REPAIR    . TOTAL KNEE ARTHROPLASTY Right     Current Medications: Current Meds  Medication Sig  . acetaminophen (TYLENOL) 500 MG tablet Take 500 mg at  bedtime by mouth.   Marland Kitchen aspirin EC 81 MG tablet Take 81 mg by mouth at bedtime.  Marland Kitchen azelastine (ASTELIN) 0.1 % nasal spray Place 1 spray into both nostrils 2 (two) times daily as needed for rhinitis.  Marland Kitchen blood glucose meter kit and supplies KIT Dispense based on  insurance preference. Tests once a day E11.9  . Carboxymethylcellulose Sodium (THERATEARS) 0.25 % SOLN Apply 1 drop 2 (two) times daily to eye.  . clidinium-chlordiazePOXIDE (LIBRAX) 5-2.5 MG capsule Take 1 capsule by mouth 4 (four) times daily -  before meals and at bedtime. As needed for abdominal cramps and diarrhea (Patient taking differently: Take 1 capsule by mouth as needed. As needed for abdominal cramps and diarrhea)  . ezetimibe (ZETIA) 10 MG tablet TAKE 1 TABLET BY MOUTH EVERY DAY  . glipiZIDE (GLUCOTROL XL) 2.5 MG 24 hr tablet Take 1 tablet (2.5 mg total) by mouth daily with breakfast.  . guaiFENesin (MUCINEX) 600 MG 12 hr tablet Take 600 mg by mouth 3 (three) times daily.   . hyoscyamine (LEVSIN SL) 0.125 MG SL tablet Place 1 tablet (0.125 mg total) under the tongue every 4 (four) hours as needed. (Patient taking differently: Place 0.125 mg under the tongue as needed. )  . isometheptene-acetaminophen-dichloralphenazone (MIDRIN) 65-100-325 MG capsule Take one capsule every 6 hours prn headache  . losartan (COZAAR) 50 MG tablet Take 1 tablet (50 mg total) by mouth every evening.  . meclizine (ANTIVERT) 25 MG tablet TAKE 1 TABLET (25 MG TOTAL) BY MOUTH 3 (THREE) TIMES DAILY AS NEEDED FOR DIZZINESS OR NAUSEA. (Patient taking differently: as needed. TAKE 1 TABLET (25 MG TOTAL) BY MOUTH 3 (THREE) TIMES DAILY AS NEEDED FOR DIZZINESS OR NAUSEA.)  . metFORMIN (GLUCOPHAGE) 500 MG tablet TAKE ONE TABLET BY MOUTH EVERY MORNING ONE AT LUNCH AND 2 AT BEDTIME  . methenamine (HIPREX) 1 g tablet Take 1 g by mouth 2 (two) times daily.  . mirabegron ER (MYRBETRIQ) 25 MG TB24 tablet Take 50 mg every morning by mouth.   . Multiple Vitamins-Minerals  (PRESERVISION AREDS 2) CAPS Take 1 capsule by mouth 2 (two) times daily.   . nateglinide (STARLIX) 120 MG tablet TAKE 1 TABLET BY MOUTH 3 TIMES A DAY BEFORE MEALS  . omega-3 acid ethyl esters (LOVAZA) 1 g capsule Take 3 g by mouth daily.   Marland Kitchen omeprazole (PRILOSEC) 40 MG capsule Take 1 capsule (40 mg total) by mouth daily.  . ondansetron (ZOFRAN ODT) 4 MG disintegrating tablet Take 1 tablet (4 mg total) by mouth every 8 (eight) hours as needed.  . ondansetron (ZOFRAN) 4 MG tablet Take 1 tablet (4 mg total) by mouth every 8 (eight) hours as needed for nausea or vomiting.  . Pancrelipase, Lip-Prot-Amyl, (CREON) 24000-76000 units CPEP TAKE 3 CAPSULES THREE TIMES DAILY WITH MEALS AND ONE CAPSULE WITH SNACK TWICE DAILY  . vitamin E 400 UNIT capsule Take 400 Units by mouth 4 (four) times a week. Pt takes on Tuesday, Thursday, Saturday, and Sunday.  . Vitamin Mixture (ESTER-C) 500-60  MG TABS Take 1 tablet by mouth daily.   . Wheat Dextrin (BENEFIBER DRINK MIX) PACK Take by mouth daily.  . [DISCONTINUED] ezetimibe (ZETIA) 10 MG tablet TAKE 1 TABLET BY MOUTH EVERY DAY  . [DISCONTINUED] losartan (COZAAR) 50 MG tablet Take 1 tablet (50 mg total) by mouth every evening.     Allergies:   Adhesive [tape], Estrogens, and Sulfonamide derivatives   Social History   Socioeconomic History  . Marital status: Married    Spouse name: Not on file  . Number of children: 3  . Years of education: Not on file  . Highest education level: Not on file  Occupational History  . Occupation: Retired  Scientific laboratory technician  . Financial resource strain: Not on file  . Food insecurity    Worry: Not on file    Inability: Not on file  . Transportation needs    Medical: Not on file    Non-medical: Not on file  Tobacco Use  . Smoking status: Never Smoker  . Smokeless tobacco: Never Used  . Tobacco comment: Never smoked  Substance and Sexual Activity  . Alcohol use: No    Alcohol/week: 0.0 standard drinks  . Drug use: No  .  Sexual activity: Not on file  Lifestyle  . Physical activity    Days per week: Not on file    Minutes per session: Not on file  . Stress: Not on file  Relationships  . Social Herbalist on phone: Not on file    Gets together: Not on file    Attends religious service: Not on file    Active member of club or organization: Not on file    Attends meetings of clubs or organizations: Not on file    Relationship status: Not on file  Other Topics Concern  . Not on file  Social History Narrative  . Not on file     Family History:  The patient's family history includes Breast cancer in her sister; Colon cancer in her cousin; Diabetes in her father and maternal aunt; Heart disease in her father; Liver cancer in her sister; Liver disease in her cousin; Ovarian cancer in her mother.   ROS:   Please see the history of present illness.    ROS All other systems reviewed and are negative.   PHYSICAL EXAM:   VS:  BP 140/70   Pulse 93   Temp 97.9 F (36.6 C) (Temporal)   Ht '5\' 4"'$  (1.626 m)   Wt 142 lb (64.4 kg)   SpO2 96%   BMI 24.37 kg/m   Physical Exam  GEN: Well nourished, well developed, in no acute distress  Neck: no JVD, carotid bruits, or masses Cardiac:RRR; no murmurs, rubs, or gallops  Respiratory:  clear to auscultation bilaterally, normal work of breathing GI: soft, nontender, nondistended, + BS Ext: without cyanosis, clubbing, or edema, Good distal pulses bilaterally Neuro:  Alert and Oriented x 3, Psych: euthymic mood, full affect  Wt Readings from Last 3 Encounters:  12/28/18 142 lb (64.4 kg)  10/02/18 137 lb 9.6 oz (62.4 kg)  08/14/18 140 lb (63.5 kg)      Studies/Labs Reviewed:   EKG:  EKG is not ordered today.  Recent Labs: 12/14/2018: ALT 12; BUN 22; Creatinine, Ser 0.79; Potassium 5.1; Sodium 142   Lipid Panel    Component Value Date/Time   CHOL 177 12/14/2018 1100   TRIG 71 12/14/2018 1100   HDL 76 12/14/2018 1100   CHOLHDL  2.3 12/14/2018  1100   CHOLHDL 3.6 11/22/2013 0852   VLDL 29 11/22/2013 0852   LDLCALC 88 12/14/2018 1100    Additional studies/ records that were reviewed today include:    NST 2017  There was no ST segment deviation noted during stress.  No T wave inversion was noted during stress.  The study is normal.  This is a low risk study.  The left ventricular ejection fraction is hyperdynamic (>65%).   2D echo 2015Study Conclusions   - Left ventricle: The cavity size was normal. Wall thickness was    increased in a pattern of mild LVH. Systolic function was    vigorous. The estimated ejection fraction was in the range of 65%    to 70%. Wall motion was normal; there were no regional wall    motion abnormalities. Doppler parameters are consistent with    abnormal left ventricular relaxation (grade 1 diastolic    dysfunction). Doppler parameters are consistent with elevated    ventricular end-diastolic filling pressure.  - Aortic valve: Trileaflet; mildly calcified leaflets. Moderate    thickening and calcification involving the left coronary cusp.    There was no significant regurgitation.  - Mitral valve: There was trivial regurgitation.  - Right atrium: Central venous pressure (est): 3 mm Hg.  - Atrial septum: No defect or patent foramen ovale was identified.  - Tricuspid valve: There was trivial regurgitation.  - Pulmonary arteries: PA peak pressure: 32 mm Hg (S).  - Pericardium, extracardiac: There was no pericardial effusion.   Impressions:   - Mild LVH with LVEF 61-60%, grade 1 diastolic dysfunction with    increased filling pressures. Mildly sclerotic aortic valve.    Trivial mitral and tricuspid regurgitation. Upper normal PASP 32    mm mercury.         ASSESSMENT:    1. Precordial pain   2. Essential hypertension   3. SVT (supraventricular tachycardia) ([place])   4. Hyperlipidemia, unspecified hyperlipidemia type   5. Type 2 diabetes mellitus with hyperosmolarity without  coma, without long-term current use of insulin ([facility])      PLAN:  In order of problems listed above:   1. History of chest pain normal NST 2017-no recent problems. 2. SVT 2019 treated with adenosine and beta-blocker now off beta-blocker only occasional palpitations. 3. Essential hypertension on losartan-labs stable 11/2018. 4. Hyperlipidemia on Zetia LDL 88 11/2018 5. Diabetes mellitus managed by PCP A1C 6.3       Medication Adjustments/Labs and Tests Ordered: Current medicines are reviewed at length with the patient today.  Concerns regarding medicines are outlined above.  Medication changes, Labs and Tests ordered today are listed in the Patient Instructions below. Patient Instructions  Medication Instructions:  Your physician recommends that you continue on your current medications as directed. Please refer to the Current Medication list given to you today.   Labwork: NONE  Testing/Procedures: NONE  Follow-Up: Your physician wants you to follow-up in:  1 YEAR.  You will receive a reminder letter in the mail two months in advance. If you don't receive a letter, please call our office to schedule the follow-up appointment.   Any Other Special Instructions Will Be Listed Below (If Applicable).     If you need a refill on your cardiac medications before your next appointment, please call your pharmacy.      Sumner Boast, PA-C  12/28/2018 11:48 AM    [place], [place]  [address] Phone: [phone]; Fax: [phone]    

## 2018-12-28 ENCOUNTER — Other Ambulatory Visit: Payer: Self-pay

## 2018-12-28 ENCOUNTER — Ambulatory Visit (INDEPENDENT_AMBULATORY_CARE_PROVIDER_SITE_OTHER): Payer: Medicare Other | Admitting: Physician Assistant

## 2018-12-28 ENCOUNTER — Encounter: Payer: Self-pay | Admitting: Physician Assistant

## 2018-12-28 VITALS — BP 140/70 | HR 93 | Temp 97.9°F | Ht 64.0 in | Wt 142.0 lb

## 2018-12-28 DIAGNOSIS — I10 Essential (primary) hypertension: Secondary | ICD-10-CM

## 2018-12-28 DIAGNOSIS — I471 Supraventricular tachycardia: Secondary | ICD-10-CM

## 2018-12-28 DIAGNOSIS — E785 Hyperlipidemia, unspecified: Secondary | ICD-10-CM

## 2018-12-28 DIAGNOSIS — R072 Precordial pain: Secondary | ICD-10-CM | POA: Diagnosis not present

## 2018-12-28 DIAGNOSIS — E1100 Type 2 diabetes mellitus with hyperosmolarity without nonketotic hyperglycemic-hyperosmolar coma (NKHHC): Secondary | ICD-10-CM

## 2018-12-28 MED ORDER — LOSARTAN POTASSIUM 50 MG PO TABS: 50.0000 mg | ORAL_TABLET | Freq: Every evening | ORAL | 3 refills | Status: DC

## 2018-12-28 MED ORDER — EZETIMIBE 10 MG PO TABS: ORAL_TABLET | ORAL | 3 refills | Status: DC

## 2018-12-28 NOTE — Patient Instructions (Signed)

## 2018-12-30 DIAGNOSIS — S338XXA Sprain of other parts of lumbar spine and pelvis, initial encounter: Secondary | ICD-10-CM | POA: Diagnosis not present

## 2018-12-30 DIAGNOSIS — S233XXA Sprain of ligaments of thoracic spine, initial encounter: Secondary | ICD-10-CM | POA: Diagnosis not present

## 2018-12-30 DIAGNOSIS — S134XXA Sprain of ligaments of cervical spine, initial encounter: Secondary | ICD-10-CM | POA: Diagnosis not present

## 2019-01-04 DIAGNOSIS — S338XXA Sprain of other parts of lumbar spine and pelvis, initial encounter: Secondary | ICD-10-CM | POA: Diagnosis not present

## 2019-01-04 DIAGNOSIS — S134XXA Sprain of ligaments of cervical spine, initial encounter: Secondary | ICD-10-CM | POA: Diagnosis not present

## 2019-01-04 DIAGNOSIS — S233XXA Sprain of ligaments of thoracic spine, initial encounter: Secondary | ICD-10-CM | POA: Diagnosis not present

## 2019-01-06 ENCOUNTER — Ambulatory Visit (INDEPENDENT_AMBULATORY_CARE_PROVIDER_SITE_OTHER): Payer: Medicare Other | Admitting: Family Medicine

## 2019-01-06 ENCOUNTER — Encounter: Payer: Self-pay | Admitting: Family Medicine

## 2019-01-06 ENCOUNTER — Other Ambulatory Visit: Payer: Self-pay

## 2019-01-06 VITALS — BP 138/76 | Temp 97.0°F | Wt 140.8 lb

## 2019-01-06 DIAGNOSIS — I10 Essential (primary) hypertension: Secondary | ICD-10-CM | POA: Diagnosis not present

## 2019-01-06 DIAGNOSIS — E782 Mixed hyperlipidemia: Secondary | ICD-10-CM | POA: Diagnosis not present

## 2019-01-06 DIAGNOSIS — E119 Type 2 diabetes mellitus without complications: Secondary | ICD-10-CM

## 2019-01-06 MED ORDER — NATEGLINIDE 120 MG PO TABS: ORAL_TABLET | ORAL | 1 refills | Status: DC

## 2019-01-06 MED ORDER — METFORMIN HCL 500 MG PO TABS: ORAL_TABLET | ORAL | 1 refills | Status: DC

## 2019-01-06 MED ORDER — MECLIZINE HCL 25 MG PO TABS: ORAL_TABLET | ORAL | 3 refills | Status: DC

## 2019-01-06 MED ORDER — GLIPIZIDE ER 2.5 MG PO TB24: 2.5000 mg | ORAL_TABLET | Freq: Every day | ORAL | 11 refills | Status: DC

## 2019-01-06 NOTE — Progress Notes (Signed)
Subjective:  Patient arrives with numerous concerns  Patient ID: Deborah Gray, female    DOB: [birth year], 85 y.o.   MRN: [id number]  Diabetes She presents for her follow-up diabetic visit. She has type 2 diabetes mellitus. There are no hypoglycemic associated symptoms. There are no diabetic associated symptoms. There are no hypoglycemic complications. There are no diabetic complications.     Pt states she has had some issues with IBS and pancreas. Pt states she is not sure if her diverticulitis is acting up or if her pancreas is giving out. Pt states that she feels like "their is stuff in her that needs to come out" but pt is afraid to use. Pt states she will see Dr.Rourke about this.    Pt feel about 2 weeks ago and hurt her back.  Recent Results (from the past 2160 hour(s))  Culture, Urine     Status: Abnormal   Collection Time: 12/09/18  5:00 PM   Specimen: Urine, Clean Catch  Result Value Ref Range   Specimen Description      URINE, CLEAN CATCH Performed at [facility], [address], [place], [place] [address]    Special Requests      NONE Performed at [facility], [address], [place], [place] [address]    Culture (A)     <10,000 COLONIES/mL INSIGNIFICANT GROWTH Performed at [facility] [address] [place], [place] [address]    Report Status 12/10/2018 FINAL   Lipid panel     Status: None   Collection Time: 12/14/18 11:00 AM  Result Value Ref Range   Cholesterol, Total 177 100 - 199 mg/dL   Triglycerides 71 0 - 149 mg/dL   HDL 76 >39 mg/dL   VLDL Cholesterol Cal 13 5 - 40 mg/dL   LDL Chol Calc (NIH) 88 0 - 99 mg/dL   Chol/HDL Ratio 2.3 0.0 - 4.4 ratio    Comment:                                   T. Chol/HDL Ratio                                             Men  Women                               1/2 Avg.Risk  3.4    3.3                                   Avg.Risk  5.0    4.4                                2X Avg.Risk  9.6    7.1                   3X Avg.Risk 23.4   11.0   Hepatic function panel     Status: Abnormal   Collection Time: 12/14/18 11:00 AM  Result Value Ref Range   Total Protein 7.0 6.0 - 8.5 g/dL   Albumin 4.8 (H) 3.6 - 4.6 g/dL  Bilirubin Total 0.4 0.0 - 1.2 mg/dL   Bilirubin, Direct 0.12 0.00 - 0.40 mg/dL   Alkaline Phosphatase 86 39 - 117 IU/L   AST 11 0 - 40 IU/L   ALT 12 0 - 32 IU/L  Basic metabolic panel     Status: Abnormal   Collection Time: 12/14/18 11:00 AM  Result Value Ref Range   Glucose 145 (H) 65 - 99 mg/dL   BUN 22 8 - 27 mg/dL   Creatinine, Ser 0.79 0.57 - 1.00 mg/dL   GFR calc non Af Amer 69 >59 mL/min/1.73   GFR calc Af Amer 80 >59 mL/min/1.73   BUN/Creatinine Ratio 28 12 - 28   Sodium 142 134 - 144 mmol/L   Potassium 5.1 3.5 - 5.2 mmol/L   Chloride 102 96 - 106 mmol/L   CO2 27 20 - 29 mmol/L   Calcium 10.5 (H) 8.7 - 10.3 mg/dL  Hemoglobin A1c     Status: Abnormal   Collection Time: 12/14/18 11:00 AM  Result Value Ref Range   Hgb A1c MFr Bld 6.3 (H) 4.8 - 5.6 %    Comment:          Prediabetes: 5.7 - 6.4          Diabetes: >6.4          Glycemic control for adults with diabetes: <7.0    Est. average glucose Bld gHb Est-mCnc 134 mg/dL    Results for orders placed or performed during the hospital encounter of 12/09/18  Culture, Urine   Specimen: Urine, Clean Catch  Result Value Ref Range   Specimen Description      URINE, CLEAN CATCH Performed at [facility], [address], [place], [place] [address]    Special Requests      NONE Performed at [facility], [address] [place], [place] [address]    Culture (A)     <10,000 COLONIES/mL INSIGNIFICANT GROWTH Performed at [facility] [address], [place],  [address]    Report Status 12/10/2018 FINAL    [place] in the room, and fell on back. Used local measures, has had significant back pain,  Injured ,  Using tylenol and    Has  Seen chiroproact or twice since then    Sugars overall doing  well   Numbers are good   Still having issues of hoarseness coming and going     Review of Systems No headache no chest pain no shortness of breath    Objective:   Physical Exam   Alert and oriented, vitals reviewed and stable, NAD ENT-TM's and ext canals WNL bilat via otoscopic exam Soft palate, tonsils and post pharynx WNL via oropharyngeal exam Neck-symmetric, no masses; thyroid nonpalpable and nontender Pulmonary-no tachypnea or accessory muscle use; Clear without wheezes via auscultation Card--no abnrml murmurs, rhythm reg and rate WNL Carotid pulses symmetric, without bruits No spinal tenderness no CVA tenderness mild right lower lumbar tenderness to deep palpation negative straight leg raise     Assessment & Plan:  Impression 1 type 2 diabetes excellent control compliance discussed medications refilled  2.  Hyperlipidemia.  Good control discussed maintain same meds compliance discussed  3.  Hypertension.  Blood pressure is good continue same treatment follow-up in 6 months diet exercise discussed  Addendum back injury.  Likely contusion with element of strain.  No need for x-rays continue chiropractic management

## 2019-01-07 DIAGNOSIS — S338XXA Sprain of other parts of lumbar spine and pelvis, initial encounter: Secondary | ICD-10-CM | POA: Diagnosis not present

## 2019-01-07 DIAGNOSIS — S233XXA Sprain of ligaments of thoracic spine, initial encounter: Secondary | ICD-10-CM | POA: Diagnosis not present

## 2019-01-07 DIAGNOSIS — S134XXA Sprain of ligaments of cervical spine, initial encounter: Secondary | ICD-10-CM | POA: Diagnosis not present

## 2019-01-08 ENCOUNTER — Ambulatory Visit (INDEPENDENT_AMBULATORY_CARE_PROVIDER_SITE_OTHER): Payer: Medicare Other | Admitting: Internal Medicine

## 2019-01-08 ENCOUNTER — Other Ambulatory Visit: Payer: Self-pay

## 2019-01-08 ENCOUNTER — Encounter: Payer: Self-pay | Admitting: Internal Medicine

## 2019-01-08 VITALS — BP 156/70 | HR 93 | Temp 96.7°F | Ht 64.0 in | Wt 141.8 lb

## 2019-01-08 DIAGNOSIS — K8689 Other specified diseases of pancreas: Secondary | ICD-10-CM

## 2019-01-08 DIAGNOSIS — K582 Mixed irritable bowel syndrome: Secondary | ICD-10-CM | POA: Diagnosis not present

## 2019-01-08 DIAGNOSIS — K219 Gastro-esophageal reflux disease without esophagitis: Secondary | ICD-10-CM

## 2019-01-08 MED ORDER — OMEPRAZOLE 20 MG PO CPDR: 20.0000 mg | DELAYED_RELEASE_CAPSULE | Freq: Every day | ORAL | 11 refills | Status: DC

## 2019-01-08 MED ORDER — HYOSCYAMINE SULFATE ER 0.375 MG PO TBCR: 0.3750 mg | EXTENDED_RELEASE_TABLET | Freq: Every day | ORAL | 11 refills | Status: DC

## 2019-01-08 NOTE — Patient Instructions (Signed)
Continue omeprazole but decrease dose to 20mg  daily (disp 30 with 11 refills)  Continue Pancreatic enzymes  Stop levsin; Begin Lev-BID (.375 mg)  one tablet each morning (disp 30 with 11 refills)  You may need further reduction in metformin in the future   Call me in 3 weeks with progress report  OV in 3 months

## 2019-01-08 NOTE — Progress Notes (Signed)
Primary Care Physician:  [name], MD Primary Gastroenterologist:  Dr. Gala Romney  Pre-Procedure History & Physical: HPI:  Deborah Gray is a 85 y.o. female here for follow-up chronic diarrhea.  Pancreatic exocrine insufficiency.  Chronic Metformin use.  Has had worsening of diarrhea recently, no vomiting ,no bloody stools.  Notes protruding hemorrhoids from time to time when she has over active bowel function.  Hemorrhoid banding previously.  Has been taking pancreatic enzymes but has not been taking her PPI regularly.  Takes Levsin sublingual as needed on occasion for diarrhea. She is gained 4 pound since her last visit.  Past Medical History:  Diagnosis Date  . Arthritis   . Benign neoplasm of colon   . Constipation   . Diaphragmatic hernia without mention of obstruction or gangrene   . Diverticulosis of colon (without mention of hemorrhage)   . Dysphagia, pharyngoesophageal phase   . Esophageal reflux   . Essential hypertension   . Heart murmur   . Hemorrhoids   . Hyperlipidemia   . Internal hemorrhoids without mention of complication   . Left wrist fracture   . PONV (postoperative nausea and vomiting)   . Stricture and stenosis of esophagus   . Type 2 diabetes mellitus ([place])   . Unspecified gastritis and gastroduodenitis without mention of hemorrhage     Past Surgical History:  Procedure Laterality Date  . ABDOMINAL HYSTERECTOMY    . BACTERIAL OVERGROWTH TEST N/A 09/06/2015   Procedure: BACTERIAL OVERGROWTH TEST;  Surgeon: Daneil Dolin, MD;  [facility]: [facility];  Service: Endoscopy;  Laterality: N/A;  800  . BREAST LUMPECTOMY Right    benign  . CATARACT EXTRACTION Bilateral 2006   Implants in both, surgeries done 6 weeks apart  . CHOLECYSTECTOMY    . COLONOSCOPY  03/08/02   [name]: diverticulosis, internal hemorrhoids, adenomatous colon polyp  . COLONOSCOPY  01/23/04   [name]: diverticulosis, internal hemorrhoids  . COLONOSCOPY  09/25/05   [name]: diverticulosis  . COLONOSCOPY  07/05/09   [name]: severe diverticulosis  . COLONOSCOPY  01/21/11   [name]: severe diverticulosis in sigmoid to desc colon, int hemorrhoids, follow up TCS in 5 years  . COLONOSCOPY N/A 04/10/2016   Procedure: COLONOSCOPY;  Surgeon: Daneil Dolin, MD;  Location: [facility];  Service: Endoscopy;  Laterality: N/A;  12:30 PM  . DILATION AND CURETTAGE OF UTERUS    . ESOPHAGEAL MANOMETRY  03/21/08   Patterson: findings c/w [place] Esophagus  . ESOPHAGOGASTRODUODENOSCOPY  03/08/02   [name]: esophageal stricture, chronic gerd, s/p dilation.   . ESOPHAGOGASTRODUODENOSCOPY  01/23/04   [name]: esophageal stricture, gastritis, hiatal hernia  . ESOPHAGOGASTRODUODENOSCOPY  09/25/05   Patterson: gastritis, benign bx, no H.pylori  . ESOPHAGOGASTRODUODENOSCOPY  07/05/09   Patterson: gastropathy, benign small bowel bx and gastric bx  . ESOPHAGOGASTRODUODENOSCOPY N/A 05/15/2015   Dr. Gala Romney- diffuse moderate inflammation characterized by congestion (edema), erythema, and linear erosions was found in the entire examined stomach. bx= reactive gastropathy  . FOOT SURGERY Left   . HEMORRHOID BANDING  2017   Dr.Somya Jauregui  . LAPAROSCOPIC VAGINAL HYSTERECTOMY    . ORIF WRIST FRACTURE Left 06/23/2018   Procedure: OPEN REDUCTION INTERNAL FIXATION (ORIF) LEFT WRIST FRACTURE;  Surgeon: Renette Butters, MD;  Location: [place];  Service: Orthopedics;  Laterality: Left;  regional arm block  . RECTOCELE REPAIR    . TOTAL KNEE ARTHROPLASTY Right     Prior to Admission medications   Medication Sig Start Date  End Date Taking? Authorizing Provider  acetaminophen (TYLENOL) 500 MG tablet Take 500 mg at bedtime by mouth.    Yes [provider]  aspirin EC 81 MG tablet Take 81 mg by mouth at bedtime.   Yes [provider]  azelastine (ASTELIN) 0.1 % nasal spray Place 1 spray into both nostrils 2 (two) times daily as needed for rhinitis.  01/08/18  Yes [name], MD  blood glucose meter kit and supplies KIT Dispense based on  insurance preference. Tests once a day E11.9 09/25/18  Yes [name], MD  Carboxymethylcellulose Sodium (THERATEARS) 0.25 % SOLN Apply 1 drop 2 (two) times daily to eye.   Yes [provider]  ezetimibe (ZETIA) 10 MG tablet TAKE 1 TABLET BY MOUTH EVERY DAY 12/28/18  Yes Imogene Burn, PA-C  guaiFENesin (MUCINEX) 600 MG 12 hr tablet Take 600 mg by mouth 3 (three) times daily.    Yes [provider]  hyoscyamine (LEVSIN SL) 0.125 MG SL tablet Place 1 tablet (0.125 mg total) under the tongue every 4 (four) hours as needed. Patient taking differently: Place 0.125 mg under the tongue as needed.  08/14/18  Yes Carlis Stable, NP  losartan (COZAAR) 50 MG tablet Take 1 tablet (50 mg total) by mouth every evening. 12/28/18  Yes Imogene Burn, PA-C  meclizine (ANTIVERT) 25 MG tablet TAKE 1 TABLET (25 MG TOTAL) BY MOUTH 3 (THREE) TIMES DAILY AS NEEDED FOR DIZZINESS OR NAUSEA. 01/06/19  Yes [name], MD  metFORMIN (GLUCOPHAGE) 500 MG tablet TAKE ONE TABLET BY MOUTH EVERY MORNING ONE AT LUNCH AND 2 AT BEDTIME 01/06/19  Yes [name], MD  methenamine (HIPREX) 1 g tablet Take 1 g by mouth 2 (two) times daily. 10/08/17  Yes [provider]  mirabegron ER (MYRBETRIQ) 25 MG TB24 tablet Take 50 mg every morning by mouth.    Yes [provider]  nateglinide (STARLIX) 120 MG tablet TAKE 1 TABLET BY MOUTH 3 TIMES A DAY BEFORE MEALS 01/06/19  Yes [name], MD  NON FORMULARY [facility] probiotic  Once a day   Yes [provider]  omega-3 acid ethyl esters (LOVAZA) 1 g capsule Take 3 g by mouth daily.    Yes [provider]  OVER THE COUNTER MEDICATION Vitamin D     One a day  Not sure strength   Yes [provider]  Pancrelipase, Lip-Prot-Amyl, (CREON) 24000-76000 units CPEP TAKE 3 CAPSULES THREE TIMES DAILY WITH MEALS AND ONE CAPSULE  WITH SNACK TWICE DAILY 07/30/18  Yes Annitta Needs, NP  vitamin E 400 UNIT capsule Take 400 Units by mouth 4 (four) times a week. Pt takes on Tuesday, Thursday, Saturday, and Sunday.   Yes [provider]  Vitamin Mixture (ESTER-C) 500-60 MG TABS Take 1 tablet by mouth daily.    Yes [provider]  Wheat Dextrin (BENEFIBER DRINK MIX) PACK Take by mouth daily.   Yes [provider]  clidinium-chlordiazePOXIDE (LIBRAX) 5-2.5 MG capsule Take 1 capsule by mouth 4 (four) times daily -  before meals and at bedtime. As needed for abdominal cramps and diarrhea Patient not taking: Reported on 01/08/2019 01/23/18   Annitta Needs, NP  glipiZIDE (GLUCOTROL XL) 2.5 MG 24 hr tablet Take 1 tablet (2.5 mg total) by mouth daily with breakfast. Patient not taking: Reported on 01/08/2019 01/06/19   [name], MD  isometheptene-acetaminophen-dichloralphenazone (MIDRIN) [phone] MG capsule Take one capsule every 6 hours  prn headache Patient not taking: Reported on 01/08/2019 12/08/18   [name], MD  Multiple Vitamins-Minerals (PRESERVISION AREDS 2) CAPS Take 1 capsule by mouth 2 (two) times daily.     [provider]  omeprazole (PRILOSEC) 40 MG capsule Take 1 capsule (40 mg total) by mouth daily. Patient not taking: Reported on 01/08/2019 12/16/17   Daneil Dolin, MD  ondansetron (ZOFRAN ODT) 4 MG disintegrating tablet Take 1 tablet (4 mg total) by mouth every 8 (eight) hours as needed. Patient not taking: Reported on 01/08/2019 06/15/18   Isla Pence, MD  ondansetron (ZOFRAN) 4 MG tablet Take 1 tablet (4 mg total) by mouth every 8 (eight) hours as needed for nausea or vomiting. Patient not taking: Reported on 01/08/2019 06/23/18   Prudencio Burly III, PA-C    Allergies as of 01/08/2019 - Review Complete 01/08/2019  Allergen Reaction Noted  . Adhesive [tape] Other (See Comments) 07/21/2016  . Estrogens Other (See Comments) 10/15/2012  . Sulfonamide  derivatives Rash 03/01/2008    Family History  Problem Relation Age of Onset  . Ovarian cancer Mother   . Diabetes Father   . Heart disease Father   . Breast cancer Sister   . Liver cancer Sister   . Colon cancer Cousin        Paternal side  . Diabetes Maternal Aunt   . Liver disease Cousin        Maternal side, never drank    Social History   Socioeconomic History  . Marital status: Married    Spouse name: Not on file  . Number of children: 3  . Years of education: Not on file  . Highest education level: Not on file  Occupational History  . Occupation: Retired  Scientific laboratory technician  . Financial resource strain: Not on file  . Food insecurity    Worry: Not on file    Inability: Not on file  . Transportation needs    Medical: Not on file    Non-medical: Not on file  Tobacco Use  . Smoking status: Never Smoker  . Smokeless tobacco: Never Used  . Tobacco comment: Never smoked  Substance and Sexual Activity  . Alcohol use: No    Alcohol/week: 0.0 standard drinks  . Drug use: No  . Sexual activity: Not on file  Lifestyle  . Physical activity    Days per week: Not on file    Minutes per session: Not on file  . Stress: Not on file  Relationships  . Social Herbalist on phone: Not on file    Gets together: Not on file    Attends religious service: Not on file    Active member of club or organization: Not on file    Attends meetings of clubs or organizations: Not on file    Relationship status: Not on file  . Intimate partner violence    Fear of current or ex partner: Not on file    Emotionally abused: Not on file    Physically abused: Not on file    Forced sexual activity: Not on file  Other Topics Concern  . Not on file  Social History Narrative  . Not on file    Review of Systems: See HPI, otherwise negative ROS  Physical Exam: BP (!) 156/70   Pulse 93   Temp (!) 96.7 F (35.9 C) (Oral)   Ht _0  (1.626 m)   Wt 141 lb 12.8 oz (64.3 kg)  BMI  24.34 kg/m  General:   Alert,  Well-developed, well-nourished, pleasant and cooperative in NAD  Impression/Plan: 85 year old lady with a history of chronic diarrhea chronic pancreatic exocrine insufficiency.  Some flare of diarrhea recently.  Not taking a PPI daily  -  concerned about side effects.  This has likely has diminished efficacy of pancreatic enzymes.  Metformin continues to be in the equation and is likely a contributing factor to loose stools.  Recommendations:  Continue omeprazole but decrease dose to 39m daily (disp 30 with 11 refills).  Risk and benefits reviewed.  Continue Pancreatic enzymes  Stop levsin; Begin Lev-BID (.375 mg)  one tablet each morning (disp 30 with 11 refills)  Further reduction of metformin may be needed in the future.    Call in 3 weeks with progress report  OV in 3 months    Notice: This dictation was prepared with Dragon dictation along with smaller phrase technology. Any transcriptional errors that result from this process are unintentional and may not be corrected upon review.

## 2019-01-11 DIAGNOSIS — H34832 Tributary (branch) retinal vein occlusion, left eye, with macular edema: Secondary | ICD-10-CM | POA: Diagnosis not present

## 2019-01-12 ENCOUNTER — Telehealth: Payer: Self-pay | Admitting: Family Medicine

## 2019-01-12 MED ORDER — TRAMADOL HCL 50 MG PO TABS: ORAL_TABLET | ORAL | 1 refills | Status: DC

## 2019-01-12 NOTE — Telephone Encounter (Signed)
pred not a good idea with diabetes, advil tyl combo fine, willing to rx some tramadol 50 to have on hand for more severe pain 30 one qd prn pain, one ref drowsy prec

## 2019-01-12 NOTE — Telephone Encounter (Signed)
Pt returned call to nurse. Please call back.

## 2019-01-12 NOTE — Telephone Encounter (Signed)
Pt states that chiropractor was wondering about putting patient on Prednisone. Pt states she is doing better since the weekend. Pt states the Tylenol and Advil works well. Pt states that the chiropractor wanted something stronger so he could do more with pt. Pt has been taking Tylenol and Advil at bedtime. (500 mg Tylenol and 200 mg Advil) Advil 200 mg with each meal on a daily basis and also been using ice pack. Pt is wanting to know if it ok to continue Tylenol and Advil regimen. Pt states that chiropractor has suggested Tramadol. Please advise. Thank you   [facility]

## 2019-01-12 NOTE — Telephone Encounter (Signed)
Left message to return call 

## 2019-01-12 NOTE — Telephone Encounter (Signed)
Pt saw chiropractor Friday and he said he was going to touch base with Dr. Richardson Landry about getting something stronger prescribed but she has been taking advil 200 mg through out the day and one at night with tylenol 50 mg. She states that has been relieving her pain fine. She is still sleeping in recliner because it hurts to lay flat in bed. She wants to know if what she is taking is ok for her or if she should take pain medication. She is going to see the chiropractor again today.

## 2019-01-12 NOTE — Telephone Encounter (Signed)
Pt contacted and verbalized understanding. Tramadol script printed and once signed will fax to pharmacy

## 2019-01-13 ENCOUNTER — Other Ambulatory Visit (HOSPITAL_COMMUNITY)
Admission: AD | Admit: 2019-01-13 | Discharge: 2019-01-13 | Disposition: A | Discharge status: Home / Self Care | Payer: Medicare Other | Source: Other Acute Inpatient Hospital | Attending: Urology | Admitting: Urology

## 2019-01-13 ENCOUNTER — Ambulatory Visit: Payer: Medicare Other | Admitting: Urology

## 2019-01-13 DIAGNOSIS — N3020 Other chronic cystitis without hematuria: Secondary | ICD-10-CM | POA: Insufficient documentation

## 2019-01-13 LAB — URINALYSIS, COMPLETE (UACMP) WITH MICROSCOPIC
Bacteria, UA: NONE SEEN
Bilirubin Urine: NEGATIVE
Glucose, UA: NEGATIVE mg/dL
Ketones, ur: NEGATIVE mg/dL
Nitrite: NEGATIVE
Protein, ur: NEGATIVE mg/dL
RBC / HPF: >50 RBC/hpf — ABNORMAL HIGH (ref 0–5)
Specific Gravity, Urine: 1.011 (ref 1.005–1.030)
pH: 6 (ref 5.0–8.0)

## 2019-01-18 DIAGNOSIS — S233XXA Sprain of ligaments of thoracic spine, initial encounter: Secondary | ICD-10-CM | POA: Diagnosis not present

## 2019-01-18 DIAGNOSIS — S134XXA Sprain of ligaments of cervical spine, initial encounter: Secondary | ICD-10-CM | POA: Diagnosis not present

## 2019-01-18 DIAGNOSIS — S338XXA Sprain of other parts of lumbar spine and pelvis, initial encounter: Secondary | ICD-10-CM | POA: Diagnosis not present

## 2019-01-21 ENCOUNTER — Other Ambulatory Visit: Payer: Self-pay | Admitting: Gastroenterology

## 2019-01-26 DIAGNOSIS — S233XXA Sprain of ligaments of thoracic spine, initial encounter: Secondary | ICD-10-CM | POA: Diagnosis not present

## 2019-01-26 DIAGNOSIS — S134XXA Sprain of ligaments of cervical spine, initial encounter: Secondary | ICD-10-CM | POA: Diagnosis not present

## 2019-01-26 DIAGNOSIS — S338XXA Sprain of other parts of lumbar spine and pelvis, initial encounter: Secondary | ICD-10-CM | POA: Diagnosis not present

## 2019-02-02 DIAGNOSIS — S338XXA Sprain of other parts of lumbar spine and pelvis, initial encounter: Secondary | ICD-10-CM | POA: Diagnosis not present

## 2019-02-02 DIAGNOSIS — S233XXA Sprain of ligaments of thoracic spine, initial encounter: Secondary | ICD-10-CM | POA: Diagnosis not present

## 2019-02-02 DIAGNOSIS — S134XXA Sprain of ligaments of cervical spine, initial encounter: Secondary | ICD-10-CM | POA: Diagnosis not present

## 2019-02-10 ENCOUNTER — Encounter: Payer: Self-pay | Admitting: Urology

## 2019-02-10 ENCOUNTER — Other Ambulatory Visit: Payer: Self-pay

## 2019-02-10 ENCOUNTER — Ambulatory Visit (INDEPENDENT_AMBULATORY_CARE_PROVIDER_SITE_OTHER): Payer: Medicare Other | Admitting: Urology

## 2019-02-10 VITALS — BP 122/76 | HR 94 | Temp 96.4°F

## 2019-02-10 DIAGNOSIS — N3010 Interstitial cystitis (chronic) without hematuria: Secondary | ICD-10-CM

## 2019-02-10 LAB — POCT URINALYSIS DIPSTICK
Bilirubin, UA: NEGATIVE
Glucose, UA: NEGATIVE
Ketones, UA: NEGATIVE
Leukocytes, UA: NEGATIVE
Nitrite, UA: NEGATIVE
Protein, UA: POSITIVE — AB
Spec Grav, UA: 1.015 (ref 1.010–1.025)
Urobilinogen, UA: NEGATIVE E.U./dL — AB
pH, UA: 5 (ref 5.0–8.0)

## 2019-02-10 MED ORDER — DIMETHYL SULFOXIDE 50 % IS SOLN
50.0000 mL | Freq: Once | INTRAVESICAL | Status: AC
  Administered 2019-02-10: 17:00:00 50 mL via URETHRAL

## 2019-02-10 NOTE — Progress Notes (Signed)
Please review

## 2019-02-17 DIAGNOSIS — S338XXA Sprain of other parts of lumbar spine and pelvis, initial encounter: Secondary | ICD-10-CM | POA: Diagnosis not present

## 2019-02-17 DIAGNOSIS — S233XXA Sprain of ligaments of thoracic spine, initial encounter: Secondary | ICD-10-CM | POA: Diagnosis not present

## 2019-02-17 DIAGNOSIS — S134XXA Sprain of ligaments of cervical spine, initial encounter: Secondary | ICD-10-CM | POA: Diagnosis not present

## 2019-02-24 DIAGNOSIS — S338XXA Sprain of other parts of lumbar spine and pelvis, initial encounter: Secondary | ICD-10-CM | POA: Diagnosis not present

## 2019-02-24 DIAGNOSIS — S233XXA Sprain of ligaments of thoracic spine, initial encounter: Secondary | ICD-10-CM | POA: Diagnosis not present

## 2019-02-24 DIAGNOSIS — S134XXA Sprain of ligaments of cervical spine, initial encounter: Secondary | ICD-10-CM | POA: Diagnosis not present

## 2019-03-01 DIAGNOSIS — H34832 Tributary (branch) retinal vein occlusion, left eye, with macular edema: Secondary | ICD-10-CM | POA: Diagnosis not present

## 2019-03-10 ENCOUNTER — Ambulatory Visit (INDEPENDENT_AMBULATORY_CARE_PROVIDER_SITE_OTHER): Payer: Medicare Other

## 2019-03-10 ENCOUNTER — Other Ambulatory Visit: Payer: Self-pay

## 2019-03-10 DIAGNOSIS — N3010 Interstitial cystitis (chronic) without hematuria: Secondary | ICD-10-CM | POA: Diagnosis not present

## 2019-03-10 LAB — POCT URINALYSIS DIPSTICK
Bilirubin, UA: NEGATIVE
Glucose, UA: NEGATIVE
Ketones, UA: NEGATIVE
Nitrite, UA: NEGATIVE
Protein, UA: NEGATIVE
Spec Grav, UA: 1.015 (ref 1.010–1.025)
Urobilinogen, UA: NEGATIVE E.U./dL — AB
pH, UA: 6 (ref 5.0–8.0)

## 2019-03-10 MED ORDER — DIMETHYL SULFOXIDE 50 % IS SOLN
50.0000 mL | Freq: Once | INTRAVESICAL | Status: AC
  Administered 2019-03-10: 15:00:00 50 mL via URETHRAL

## 2019-03-10 MED ORDER — LIDOCAINE HCL 2 % IJ SOLN
10.0000 mL | Freq: Once | INTRAMUSCULAR | Status: AC
  Administered 2019-03-10: 200 mg

## 2019-03-10 MED ORDER — SODIUM BICARBONATE 8.4 % IV SOLN
50.0000 meq | Freq: Once | INTRAVENOUS | Status: AC
  Administered 2019-03-10: 50 meq

## 2019-03-10 MED ORDER — HYDROCORTISONE NA SUCCINATE PF 100 MG IJ SOLR
100.0000 mg | Freq: Once | INTRAMUSCULAR | Status: AC
  Administered 2019-03-10: 100 mg

## 2019-03-10 NOTE — Progress Notes (Signed)
Bladder Rescue Solution Instillation  Due to IC patient is present today for a Rescue Solution Treatment.  Patient was cleaned and prepped in a sterile fashion with betadine.  A 18 FR catheter was inserted, urine return was noted 200 ml, urine was yellow in color.  Instilled a solution consisting of 50 ml of Sodium Bicarb, 10 ml Lidocaine 2% and 50 ml of DMSO AND 100 mg Solu-cortef. The catheter was then removed. Patient tolerated well, no complications were noted.   Performed by: H. Amal Saiki LPN  Follow up/ Additional Notes: I month NV

## 2019-03-11 DIAGNOSIS — S338XXA Sprain of other parts of lumbar spine and pelvis, initial encounter: Secondary | ICD-10-CM | POA: Diagnosis not present

## 2019-03-11 DIAGNOSIS — S134XXA Sprain of ligaments of cervical spine, initial encounter: Secondary | ICD-10-CM | POA: Diagnosis not present

## 2019-03-11 DIAGNOSIS — S233XXA Sprain of ligaments of thoracic spine, initial encounter: Secondary | ICD-10-CM | POA: Diagnosis not present

## 2019-03-15 DIAGNOSIS — M25562 Pain in left knee: Secondary | ICD-10-CM | POA: Diagnosis not present

## 2019-03-15 DIAGNOSIS — E119 Type 2 diabetes mellitus without complications: Secondary | ICD-10-CM | POA: Diagnosis not present

## 2019-03-15 DIAGNOSIS — M419 Scoliosis, unspecified: Secondary | ICD-10-CM | POA: Diagnosis not present

## 2019-03-16 ENCOUNTER — Encounter: Payer: Self-pay | Admitting: Internal Medicine

## 2019-03-19 ENCOUNTER — Other Ambulatory Visit: Payer: Self-pay

## 2019-03-19 ENCOUNTER — Ambulatory Visit (INDEPENDENT_AMBULATORY_CARE_PROVIDER_SITE_OTHER): Payer: BC Managed Care – PPO | Admitting: Family Medicine

## 2019-03-19 DIAGNOSIS — J019 Acute sinusitis, unspecified: Secondary | ICD-10-CM | POA: Diagnosis not present

## 2019-03-19 DIAGNOSIS — B9689 Other specified bacterial agents as the cause of diseases classified elsewhere: Secondary | ICD-10-CM

## 2019-03-19 MED ORDER — AMOXICILLIN 500 MG PO CAPS: 500.0000 mg | ORAL_CAPSULE | Freq: Three times a day (TID) | ORAL | 0 refills | Status: DC

## 2019-03-19 NOTE — Progress Notes (Signed)
   Subjective:  Audio plus video  Patient ID: Deborah Gray, female    DOB: [birth year], 86 y.o.   MRN: [id number]  Sinusitis This is a recurrent problem. Episode onset: 2 months. Associated symptoms include congestion. (Dull headache off and on) Treatments tried: mucinex every day for past two months.   Hoarseness started back yesterday.   Dry mouth for the past 3 days.   Virtual Visit via Telephone Note  I connected with Deborah Gray on 03/19/19 at  3:00 PM EST by telephone and verified that I am speaking with the correct person using two identifiers.  Location: Patient: home Provider: office   I discussed the limitations, risks, security and privacy concerns of performing an evaluation and management service by telephone and the availability of in person appointments. I also discussed with the patient that there may be a patient responsible charge related to this service. The patient expressed understanding and agreed to proceed.   History of Present Illness:    Observations/Objective:   Assessment and Plan:   Follow Up Instructions:    I discussed the assessment and treatment plan with the patient. The patient was provided an opportunity to ask questions and all were answered. The patient agreed with the plan and demonstrated an understanding of the instructions.   The patient was advised to call back or seek an in-person evaluation if the symptoms worsen or if the condition fails to improve as anticipated.  I provided 20 minutes of non-face-to-face time during this encounter.   Several weeks worth of symptoms.  Progressive in nature.  Facial pain.  Tenderness.  Progressive congestion and fullness   Review of Systems  HENT: Positive for congestion.        Objective:   Physical Exam  Virtual      Assessment & Plan:  Impression probable sinusitis.  Discussed.  Antibiotics prescribed.  Symptom care discussed warning signs discussed

## 2019-03-25 ENCOUNTER — Encounter: Payer: Self-pay | Admitting: Family Medicine

## 2019-03-25 DIAGNOSIS — S338XXA Sprain of other parts of lumbar spine and pelvis, initial encounter: Secondary | ICD-10-CM | POA: Diagnosis not present

## 2019-03-25 DIAGNOSIS — S233XXA Sprain of ligaments of thoracic spine, initial encounter: Secondary | ICD-10-CM | POA: Diagnosis not present

## 2019-03-25 DIAGNOSIS — S134XXA Sprain of ligaments of cervical spine, initial encounter: Secondary | ICD-10-CM | POA: Diagnosis not present

## 2019-03-30 ENCOUNTER — Ambulatory Visit: Payer: Medicare Other | Admitting: Internal Medicine

## 2019-04-05 ENCOUNTER — Other Ambulatory Visit: Payer: Self-pay | Admitting: Gastroenterology

## 2019-04-06 ENCOUNTER — Other Ambulatory Visit: Payer: Self-pay

## 2019-04-06 ENCOUNTER — Encounter: Payer: Self-pay | Admitting: Internal Medicine

## 2019-04-06 ENCOUNTER — Telehealth: Payer: Self-pay

## 2019-04-06 ENCOUNTER — Ambulatory Visit (INDEPENDENT_AMBULATORY_CARE_PROVIDER_SITE_OTHER): Payer: Medicare Other | Admitting: Internal Medicine

## 2019-04-06 VITALS — BP 137/67 | HR 94 | Temp 96.9°F | Ht 64.0 in | Wt 142.4 lb

## 2019-04-06 DIAGNOSIS — N3010 Interstitial cystitis (chronic) without hematuria: Secondary | ICD-10-CM

## 2019-04-06 DIAGNOSIS — Z87440 Personal history of urinary (tract) infections: Secondary | ICD-10-CM

## 2019-04-06 DIAGNOSIS — K219 Gastro-esophageal reflux disease without esophagitis: Secondary | ICD-10-CM

## 2019-04-06 DIAGNOSIS — K648 Other hemorrhoids: Secondary | ICD-10-CM

## 2019-04-06 DIAGNOSIS — K582 Mixed irritable bowel syndrome: Secondary | ICD-10-CM

## 2019-04-06 DIAGNOSIS — K8689 Other specified diseases of pancreas: Secondary | ICD-10-CM | POA: Diagnosis not present

## 2019-04-06 MED ORDER — HYOSCYAMINE SULFATE 0.125 MG SL SUBL: 0.1250 mg | SUBLINGUAL_TABLET | SUBLINGUAL | 3 refills | Status: DC | PRN

## 2019-04-06 MED ORDER — METHENAMINE HIPPURATE 1 G PO TABS: 1.0000 g | ORAL_TABLET | Freq: Two times a day (BID) | ORAL | 3 refills | Status: DC

## 2019-04-06 NOTE — Progress Notes (Signed)
Primary Care Physician:  [name], MD Primary Gastroenterologist:  Dr. Gala Romney  Pre-Procedure History & Physical: HPI:  Deborah Gray is a 86 y.o. female here for follow-up pancreatic exocrine insufficiency diarrhea.  GERD.  GERD and diarrhea better now on omeprazole 20 mg every day.  No dysphagia aphasia.  No abdominal pain.  Appetite good.  On her own, she got some Hardin Negus' colon health and started taking it.  Stop the Benefiber.  All in all, she states her GI symptoms have settled down globally.  She also gives credit to taking 1 Levbid 0.375 mg each morning.  Insurance is told patient they would not be paying for Levbid after April of this year.  They suggest dicyclomine.  She is only had 2 bouts of diarrhea in 4 months for which she has taken some Levsin sublingually 0.125 mg.  History of grade 3 hemorrhoid.  Successful banding on multiple occasions previously.  Has 1 aggravating grade 3 hemorrhoid by description.  She easily reduces it.  Past Medical History:  Diagnosis Date  . Arthritis   . Benign neoplasm of colon   . Constipation   . Diaphragmatic hernia without mention of obstruction or gangrene   . Diverticulosis of colon (without mention of hemorrhage)   . Dysphagia, pharyngoesophageal phase   . Esophageal reflux   . Essential hypertension   . Heart murmur   . Hemorrhoids   . Hyperlipidemia   . Internal hemorrhoids without mention of complication   . Left wrist fracture   . PONV (postoperative nausea and vomiting)   . Stricture and stenosis of esophagus   . Type 2 diabetes mellitus ([place])   . Unspecified gastritis and gastroduodenitis without mention of hemorrhage     Past Surgical History:  Procedure Laterality Date  . ABDOMINAL HYSTERECTOMY    . BACTERIAL OVERGROWTH TEST N/A 09/06/2015   Procedure: BACTERIAL OVERGROWTH TEST;  Surgeon: Daneil Dolin, MD;  [facility];  Service: Endoscopy;  Laterality: N/A;  800  . BREAST LUMPECTOMY Right      benign  . CATARACT EXTRACTION Bilateral 2006   Implants in both, surgeries done 6 weeks apart  . CHOLECYSTECTOMY    . COLONOSCOPY  03/08/02   [name]: diverticulosis, internal hemorrhoids, adenomatous colon polyp  . COLONOSCOPY  01/23/04   [name]: diverticulosis, internal hemorrhoids  . COLONOSCOPY  09/25/05   [name]: diverticulosis  . COLONOSCOPY  07/05/09   [name]: severe diverticulosis  . COLONOSCOPY  01/21/11   [name]: severe diverticulosis in sigmoid to desc colon, int hemorrhoids, follow up TCS in 5 years  . COLONOSCOPY N/A 04/10/2016   Procedure: COLONOSCOPY;  Surgeon: Daneil Dolin, MD;  Location: [facility];  Service: Endoscopy;  Laterality: N/A;  12:30 PM  . DILATION AND CURETTAGE OF UTERUS    . ESOPHAGEAL MANOMETRY  03/21/08   Patterson: findings c/w [place] Esophagus  . ESOPHAGOGASTRODUODENOSCOPY  03/08/02   [name]: esophageal stricture, chronic gerd, s/p dilation.   . ESOPHAGOGASTRODUODENOSCOPY  01/23/04   [name]: esophageal stricture, gastritis, hiatal hernia  . ESOPHAGOGASTRODUODENOSCOPY  09/25/05   Patterson: gastritis, benign bx, no H.pylori  . ESOPHAGOGASTRODUODENOSCOPY  07/05/09   Patterson: gastropathy, benign small bowel bx and gastric bx  . ESOPHAGOGASTRODUODENOSCOPY N/A 05/15/2015   Dr. Gala Romney- diffuse moderate inflammation characterized by congestion (edema), erythema, and linear erosions was found in the entire examined stomach. bx= reactive gastropathy  . FOOT SURGERY Left   . HEMORRHOID BANDING  2017   Dr.Taline Nass  .  LAPAROSCOPIC VAGINAL HYSTERECTOMY    . ORIF WRIST FRACTURE Left 06/23/2018   Procedure: OPEN REDUCTION INTERNAL FIXATION (ORIF) LEFT WRIST FRACTURE;  Surgeon: Renette Butters, MD;  Location: [facility];  Service: Orthopedics;  Laterality: Left;  regional arm block  . RECTOCELE REPAIR    . TOTAL KNEE ARTHROPLASTY Right     Prior to Admission medications   Medication Sig Start Date End Date Taking? Authorizing  Provider  acetaminophen (TYLENOL) 500 MG tablet Take 500 mg at bedtime by mouth.    Yes [provider]  aspirin EC 81 MG tablet Take 81 mg by mouth at bedtime.   Yes [provider]  azelastine (ASTELIN) 0.1 % nasal spray Place 1 spray into both nostrils 2 (two) times daily as needed for rhinitis. 01/08/18  Yes [name], MD  blood glucose meter kit and supplies KIT Dispense based on  insurance preference. Tests once a day E11.9 09/25/18  Yes [name], MD  Carboxymethylcellulose Sodium (THERATEARS) 0.25 % SOLN Apply 1 drop 2 (two) times daily to eye.   Yes [provider]  ezetimibe (ZETIA) 10 MG tablet TAKE 1 TABLET BY MOUTH EVERY DAY 12/28/18  Yes Imogene Burn, PA-C  guaiFENesin (MUCINEX) 600 MG 12 hr tablet Take 600 mg by mouth 3 (three) times daily.    Yes [provider]  Hyoscyamine Sulfate 0.375 MG TBCR Take 1 tablet (0.375 mg total) by mouth daily after breakfast. 01/08/19  Yes Isaly Fasching, Cristopher Estimable, MD  losartan (COZAAR) 50 MG tablet Take 1 tablet (50 mg total) by mouth every evening. 12/28/18  Yes Imogene Burn, PA-C  meclizine (ANTIVERT) 25 MG tablet TAKE 1 TABLET (25 MG TOTAL) BY MOUTH 3 (THREE) TIMES DAILY AS NEEDED FOR DIZZINESS OR NAUSEA. 01/06/19  Yes [name], MD  metFORMIN (GLUCOPHAGE) 500 MG tablet TAKE ONE TABLET BY MOUTH EVERY MORNING ONE AT LUNCH AND 2 AT BEDTIME 01/06/19  Yes [name], MD  methenamine (HIPREX) 1 g tablet Take 1 g by mouth 2 (two) times daily. 10/08/17  Yes [provider]  mirabegron ER (MYRBETRIQ) 25 MG TB24 tablet Take 50 mg every morning by mouth.    Yes [provider]  Multiple Vitamins-Minerals (PRESERVISION AREDS 2) CAPS Take 1 capsule by mouth 2 (two) times daily.    Yes [provider]  nateglinide (STARLIX) 120 MG tablet TAKE 1 TABLET BY MOUTH 3 TIMES A DAY BEFORE MEALS 01/06/19  Yes [name], MD  NON FORMULARY [place] probiotic  Once a day   Yes  [provider]  omega-3 acid ethyl esters (LOVAZA) 1 g capsule Take 3 g by mouth daily.    Yes [provider]  omeprazole (PRILOSEC) 20 MG capsule Take 1 capsule (20 mg total) by mouth daily. 01/08/19  Yes Abree Romick, Cristopher Estimable, MD  OVER THE COUNTER MEDICATION Vitamin D     One a day  Not sure strength   Yes [provider]  Pancrelipase, Lip-Prot-Amyl, (CREON) 24000-76000 units CPEP TAKE 3 CAPSULES THREE TIMES DAILY WITH MEALS AND ONE CAPSULE WITH SNACK TWICE DAILY 07/30/18  Yes Annitta Needs, NP  vitamin E 400 UNIT capsule Take 400 Units by mouth 4 (four) times a week. Pt takes on Tuesday, Thursday, Saturday, and Sunday.   Yes [provider]  Vitamin Mixture (ESTER-C) 500-60 MG TABS Take 1 tablet by mouth daily.    Yes [provider]  amoxicillin (AMOXIL) 500 MG capsule  Take 1 capsule (500 mg total) by mouth 3 (three) times daily. Patient not taking: Reported on 04/06/2019 03/19/19   [name], MD    Allergies as of 04/06/2019 - Review Complete 04/06/2019  Allergen Reaction Noted  . Adhesive [tape] Other (See Comments) 07/21/2016  . Estrogens Other (See Comments) 10/15/2012  . Sulfonamide derivatives Rash 03/01/2008    Family History  Problem Relation Age of Onset  . Ovarian cancer Mother   . Diabetes Father   . Heart disease Father   . Breast cancer Sister   . Liver cancer Sister   . Colon cancer Cousin        Paternal side  . Diabetes Maternal Aunt   . Liver disease Cousin        Maternal side, never drank    Social History   Socioeconomic History  . Marital status: Married    Spouse name: Not on file  . Number of children: 3  . Years of education: Not on file  . Highest education level: Not on file  Occupational History  . Occupation: Retired  Tobacco Use  . Smoking status: Never Smoker  . Smokeless tobacco: Never Used  . Tobacco comment: Never smoked  Substance and Sexual Activity  . Alcohol use: No    Alcohol/week:  0.0 standard drinks  . Drug use: No  . Sexual activity: Not on file  Other Topics Concern  . Not on file  Social History Narrative  . Not on file   Social Determinants of Health   Financial Resource Strain:   . Difficulty of Paying Living Expenses: Not on file  Food Insecurity:   . Worried About Charity fundraiser in the Last Year: Not on file  . Ran Out of Food in the Last Year: Not on file  Transportation Needs:   . Lack of Transportation (Medical): Not on file  . Lack of Transportation (Non-Medical): Not on file  Physical Activity:   . Days of Exercise per Week: Not on file  . Minutes of Exercise per Session: Not on file  Stress:   . Feeling of Stress : Not on file  Social Connections:   . Frequency of Communication with Friends and Family: Not on file  . Frequency of Social Gatherings with Friends and Family: Not on file  . Attends Religious Services: Not on file  . Active Member of Clubs or Organizations: Not on file  . Attends Archivist Meetings: Not on file  . Marital Status: Not on file  Intimate Partner Violence:   . Fear of Current or Ex-Partner: Not on file  . Emotionally Abused: Not on file  . Physically Abused: Not on file  . Sexually Abused: Not on file    Review of Systems: See HPI, otherwise negative ROS  Physical Exam: BP 137/67   Pulse 94   Temp (!) 96.9 F (36.1 C) (Oral)   Ht '5\' 4"'$  (1.626 m)   Wt 142 lb 6.4 oz (64.6 kg)   BMI 24.44 kg/m  General:   Alert,  Well-developed, well-nourished, pleasant and cooperative in NAD   Impression/Plan: Pleasant 86 year old lady with pancreatic exocrine insufficiency, GERD, multifactorial etiology (pancreatic insufficiency, IBS).  Symptomatic grade 3 hemorrhoid. GERD, Sharyon Cable pancreatic insufficiency symptoms/IBS all much improved on the regimen outlined above. I am optimistic she would benefit from hemorrhoid banding in the near future.   Recommendations:  Continue [facility] Colon Health  daily  OK to stop Benefiber  Continue omeprazole daily  Continue pancreatic enzyme therapy daily  Continue hyoscyamine .375 mg each morning (we can try Dicyclomine each morning if insurance requires in the future).  Use levsin .125 mg unde the tongue before meals and at bedtime as needed for flares in symptoms (30) with 3 refills.  Pamphlet on hemorrhoid banding  Office visit with me in 6 weeks with me for hemorrhoid banding     Notice: This dictation was prepared with Dragon dictation along with smaller phrase technology. Any transcriptional errors that result from this process are unintentional and may not be corrected upon review.

## 2019-04-06 NOTE — Telephone Encounter (Signed)
Please clarify if patient is still taking imipramine as it is no longer listed on her med list.

## 2019-04-06 NOTE — Telephone Encounter (Signed)
Per Dr. Gala Romney, Rx for Levsin 0.125 mg #30 and use one under tongue before meals and at bedtime as needed for flares in symptoms. # refills

## 2019-04-06 NOTE — Patient Instructions (Signed)
Continue Phil[facility] Colon Health daily  OK to stop Benefiber  Continue omeprazole daily  Continue pancreatic enzyme therapy daily  Continue hyoscyamine .375 mg each morning (we can try Dicyclomine each morning if insurance requires in the future).  Use levsin .125 mg unde the tongue before meals and at bedtime as needed for flares in symptoms (30) with 3 refills.  Pamphlet on hemorrhoid banding  Office visit with me in 6 weeks with me for hemorrhoid banding

## 2019-04-07 DIAGNOSIS — S134XXA Sprain of ligaments of cervical spine, initial encounter: Secondary | ICD-10-CM | POA: Diagnosis not present

## 2019-04-07 DIAGNOSIS — S338XXA Sprain of other parts of lumbar spine and pelvis, initial encounter: Secondary | ICD-10-CM | POA: Diagnosis not present

## 2019-04-07 DIAGNOSIS — S233XXA Sprain of ligaments of thoracic spine, initial encounter: Secondary | ICD-10-CM | POA: Diagnosis not present

## 2019-04-07 NOTE — Telephone Encounter (Signed)
Spoke with pt. Pt says she does take one pill at night to most nights. Pt would like this medication refilled.

## 2019-04-12 DIAGNOSIS — S233XXA Sprain of ligaments of thoracic spine, initial encounter: Secondary | ICD-10-CM | POA: Diagnosis not present

## 2019-04-12 DIAGNOSIS — S134XXA Sprain of ligaments of cervical spine, initial encounter: Secondary | ICD-10-CM | POA: Diagnosis not present

## 2019-04-12 DIAGNOSIS — S338XXA Sprain of other parts of lumbar spine and pelvis, initial encounter: Secondary | ICD-10-CM | POA: Diagnosis not present

## 2019-04-13 ENCOUNTER — Ambulatory Visit (INDEPENDENT_AMBULATORY_CARE_PROVIDER_SITE_OTHER): Payer: Medicare Other

## 2019-04-13 ENCOUNTER — Other Ambulatory Visit: Payer: Self-pay

## 2019-04-13 DIAGNOSIS — N3010 Interstitial cystitis (chronic) without hematuria: Secondary | ICD-10-CM

## 2019-04-13 LAB — POCT URINALYSIS DIPSTICK
Bilirubin, UA: NEGATIVE
Blood, UA: 3
Glucose, UA: NEGATIVE
Ketones, UA: NEGATIVE
Nitrite, UA: NEGATIVE
Protein, UA: NEGATIVE
Spec Grav, UA: >=1.03 — AB (ref 1.010–1.025)
Urobilinogen, UA: NEGATIVE E.U./dL — AB
pH, UA: 5 (ref 5.0–8.0)

## 2019-04-13 MED ORDER — HYDROCORTISONE SOD SUCCINATE 100 MG PF FOR IT USE
100.0000 mg | Freq: Once | INTRAMUSCULAR | Status: AC
  Administered 2019-04-13: 100 mg via INTRATHECAL

## 2019-04-13 MED ORDER — DIMETHYL SULFOXIDE 50 % IS SOLN
50.0000 mL | Freq: Once | INTRAVESICAL | Status: AC
  Administered 2019-04-13: 50 mL via URETHRAL

## 2019-04-13 MED ORDER — LIDOCAINE HCL 1 % IJ SOLN
20.0000 mL | Freq: Once | INTRAMUSCULAR | Status: AC
  Administered 2019-04-13: 20 mL

## 2019-04-13 MED ORDER — SODIUM BICARBONATE 8.4 % IV SOLN
50.0000 meq | Freq: Once | INTRAVENOUS | Status: AC
  Administered 2019-04-13: 16:00:00 50 meq

## 2019-04-13 NOTE — Progress Notes (Signed)
Bladder Instillation DMSO  Due to IC patient is present today for a Bladder Instillation of DMSO treatment. Patient was cleaned and prepped in a sterile fashion with betadine.  A 18FR catheter was inserted, urine return was noted 154ml, urine was yellow in color.  DMSO treatment was instilled into the bladder. The catheter was then removed. Patient tolerated well, no complications were noted pt was instructed to hold the instillation for 20 minutes and then will void it out. Pt voiced understanding.    Preformed by: Gibson Ramp RN   Follow up/ Additional notes: 1 month NV

## 2019-04-14 ENCOUNTER — Telehealth: Payer: Self-pay | Admitting: Internal Medicine

## 2019-04-14 DIAGNOSIS — S338XXA Sprain of other parts of lumbar spine and pelvis, initial encounter: Secondary | ICD-10-CM | POA: Diagnosis not present

## 2019-04-14 DIAGNOSIS — S233XXA Sprain of ligaments of thoracic spine, initial encounter: Secondary | ICD-10-CM | POA: Diagnosis not present

## 2019-04-14 DIAGNOSIS — S134XXA Sprain of ligaments of cervical spine, initial encounter: Secondary | ICD-10-CM | POA: Diagnosis not present

## 2019-04-14 NOTE — Telephone Encounter (Signed)
Lmom, waiting on a return call.  

## 2019-04-14 NOTE — Telephone Encounter (Signed)
.   She has questions about her abdomin pain and hard stools. Please call [phone]

## 2019-04-15 NOTE — Telephone Encounter (Signed)
Watch for fever.  I would lighten diet up for the next 48 hours if symptoms worsen or she develops a fever let us know.  Hopefully self-limiting, nonspecific symptoms.  Have her touch base with [place] the first of the week and let us know how she is doing.

## 2019-04-15 NOTE — Telephone Encounter (Signed)
Spoke with pt. Pt was having a dull ache and says her lt lower abdomen pain was severe. Pt had 6 bowel movements in the form of diarrhea. Diarrhea wasn't watery. Pt finished Amoxicillin 1 weeks about. Today pts pain has decreased to a level 3 with 10 being the highest. When pt moves, the lt lower abdominal pain is still there and a dull ache is felt. Pt is taking Philips colon health, hyoscyamine , Levsin daily and d/c benefiber as directed at last apt. Pt states she has a hx of diverticulitis and isn't sure if she is having a flare. Pt hasn't had any loose stools today.

## 2019-04-15 NOTE — Telephone Encounter (Signed)
Noted. Spoke with pt. Pt notified to watch out for fever, eat a light diet and contact our office the first of the week. Pt is to call our office if a fever develops.

## 2019-04-19 DIAGNOSIS — H34832 Tributary (branch) retinal vein occlusion, left eye, with macular edema: Secondary | ICD-10-CM | POA: Diagnosis not present

## 2019-04-20 ENCOUNTER — Other Ambulatory Visit: Payer: Self-pay

## 2019-04-20 ENCOUNTER — Telehealth: Payer: Self-pay | Admitting: Urology

## 2019-04-20 DIAGNOSIS — N3010 Interstitial cystitis (chronic) without hematuria: Secondary | ICD-10-CM

## 2019-04-20 DIAGNOSIS — Z87440 Personal history of urinary (tract) infections: Secondary | ICD-10-CM

## 2019-04-20 MED ORDER — METHENAMINE HIPPURATE 1 G PO TABS: 1.0000 g | ORAL_TABLET | Freq: Two times a day (BID) | ORAL | 3 refills | Status: DC

## 2019-04-20 NOTE — Telephone Encounter (Signed)
Patient called and states she cannot get methenamine rx from [facility] pharmacy because they are out. She states she needs [facility] [place]. She says she is out of meds and needs it today if possible.

## 2019-04-20 NOTE — Telephone Encounter (Signed)
Refill submitted. 

## 2019-04-26 DIAGNOSIS — S338XXA Sprain of other parts of lumbar spine and pelvis, initial encounter: Secondary | ICD-10-CM | POA: Diagnosis not present

## 2019-04-26 DIAGNOSIS — S134XXA Sprain of ligaments of cervical spine, initial encounter: Secondary | ICD-10-CM | POA: Diagnosis not present

## 2019-04-26 DIAGNOSIS — S233XXA Sprain of ligaments of thoracic spine, initial encounter: Secondary | ICD-10-CM | POA: Diagnosis not present

## 2019-05-03 DIAGNOSIS — S233XXA Sprain of ligaments of thoracic spine, initial encounter: Secondary | ICD-10-CM | POA: Diagnosis not present

## 2019-05-03 DIAGNOSIS — S338XXA Sprain of other parts of lumbar spine and pelvis, initial encounter: Secondary | ICD-10-CM | POA: Diagnosis not present

## 2019-05-03 DIAGNOSIS — M9901 Segmental and somatic dysfunction of cervical region: Secondary | ICD-10-CM | POA: Diagnosis not present

## 2019-05-03 DIAGNOSIS — M9902 Segmental and somatic dysfunction of thoracic region: Secondary | ICD-10-CM | POA: Diagnosis not present

## 2019-05-03 DIAGNOSIS — S134XXA Sprain of ligaments of cervical spine, initial encounter: Secondary | ICD-10-CM | POA: Diagnosis not present

## 2019-05-10 DIAGNOSIS — S134XXA Sprain of ligaments of cervical spine, initial encounter: Secondary | ICD-10-CM | POA: Diagnosis not present

## 2019-05-10 DIAGNOSIS — S233XXA Sprain of ligaments of thoracic spine, initial encounter: Secondary | ICD-10-CM | POA: Diagnosis not present

## 2019-05-10 DIAGNOSIS — S338XXA Sprain of other parts of lumbar spine and pelvis, initial encounter: Secondary | ICD-10-CM | POA: Diagnosis not present

## 2019-05-11 ENCOUNTER — Encounter: Payer: Self-pay | Admitting: Internal Medicine

## 2019-05-11 ENCOUNTER — Other Ambulatory Visit: Payer: Self-pay

## 2019-05-11 ENCOUNTER — Ambulatory Visit: Payer: Medicare Other | Admitting: Internal Medicine

## 2019-05-11 VITALS — BP 156/71 | HR 95 | Temp 96.8°F | Ht 64.0 in | Wt 139.6 lb

## 2019-05-11 DIAGNOSIS — K648 Other hemorrhoids: Secondary | ICD-10-CM

## 2019-05-11 NOTE — Progress Notes (Signed)
[place] banding procedure note:  The patient presents with symptomatic grade 3 hemorrhoids, unresponsive to maximal medical therapy, requesting rubber band ligation of his/her hemorrhoidal disease. All risks, benefits, and alternative forms of therapy were described and informed consent was obtained.  Previously successfully banded.  Here for touchup.  In the left lateral decubitus position; DRE performed with Xylocaine as a lubricant.  Anoscopy performed revealing revealed grade 3 hemorrhoids in the mid and anterior position. position (s).  The decision was made to band the in the right mid and anterior hemorrhoid column. internal hemorrhoid, and the [place] was used to perform band ligationx2 without complication. Digital anorectal examination was then performed to assure proper positioning of the band and to adjust the banded tissue as required.  Bands found to be in excellent position without adjustment being needed.  The patient was discharged home without pain or other issues. Dietary and behavioral recommendations were given  The patient will return in 6 weeks for followup and possible additional banding as required.  No complications were encountered and the patient tolerated the procedure well.

## 2019-05-11 NOTE — Patient Instructions (Signed)
Avoid straining.  Benefiber 2 teaspoons twice daily  Limit toilet time to 5 minutes  Call with any interim problems  Schedule followup appointment in 6 weeks from now

## 2019-05-12 ENCOUNTER — Ambulatory Visit (INDEPENDENT_AMBULATORY_CARE_PROVIDER_SITE_OTHER): Payer: Medicare Other

## 2019-05-12 VITALS — Temp 97.2°F

## 2019-05-12 DIAGNOSIS — N3010 Interstitial cystitis (chronic) without hematuria: Secondary | ICD-10-CM

## 2019-05-12 LAB — POCT URINALYSIS DIPSTICK
Bilirubin, UA: NEGATIVE
Glucose, UA: NEGATIVE
Ketones, UA: NEGATIVE
Nitrite, UA: NEGATIVE
Protein, UA: NEGATIVE
Spec Grav, UA: 1.01 (ref 1.010–1.025)
Urobilinogen, UA: 0.2 E.U./dL
pH, UA: 8 (ref 5.0–8.0)

## 2019-05-12 MED ORDER — HYDROCORTISONE NA SUCCINATE PF 100 MG IJ SOLR
100.0000 mg | Freq: Once | INTRAMUSCULAR | Status: AC
  Administered 2019-05-12: 100 mg

## 2019-05-12 MED ORDER — DIMETHYL SULFOXIDE 50 % IS SOLN
50.0000 mL | Freq: Once | INTRAVESICAL | Status: AC
  Administered 2019-05-12: 50 mL via URETHRAL

## 2019-05-12 MED ORDER — SODIUM BICARBONATE 8.4 % IV SOLN
50.0000 meq | Freq: Once | INTRAVENOUS | Status: AC
  Administered 2019-05-12: 50 meq via INTRAVENOUS

## 2019-05-12 MED ORDER — LIDOCAINE HCL 1 % IJ SOLN
20.0000 mL | Freq: Once | INTRAMUSCULAR | Status: AC
  Administered 2019-05-12: 20 mL

## 2019-05-12 NOTE — Progress Notes (Signed)
Bladder Rescue Solution Instillation  Due to IC patient is present today for a Rescue Solution Treatment.  Patient was cleaned and prepped in a sterile fashion with betadine.  A 18  FR catheter was inserted, urine return was noted 50 ml, urine was yellow in color.  Instilled a solution consisting of 47ml of Sodium Bicarb, 20 ml Lid1ocaine and 100 mg of Solu-C, 13ml The catheter was then removed. Patient tolerated well, no complications were noted.   Performed by: H.Tayen Narang LPN  Follow up/ Additional Notes: 1 month NV DMSO

## 2019-05-24 DIAGNOSIS — S134XXA Sprain of ligaments of cervical spine, initial encounter: Secondary | ICD-10-CM | POA: Diagnosis not present

## 2019-05-24 DIAGNOSIS — S233XXA Sprain of ligaments of thoracic spine, initial encounter: Secondary | ICD-10-CM | POA: Diagnosis not present

## 2019-05-24 DIAGNOSIS — S338XXA Sprain of other parts of lumbar spine and pelvis, initial encounter: Secondary | ICD-10-CM | POA: Diagnosis not present

## 2019-05-25 ENCOUNTER — Encounter (HOSPITAL_COMMUNITY): Payer: Self-pay

## 2019-05-25 ENCOUNTER — Other Ambulatory Visit: Payer: Self-pay

## 2019-05-25 ENCOUNTER — Telehealth: Payer: Self-pay | Admitting: *Deleted

## 2019-05-25 ENCOUNTER — Emergency Department (HOSPITAL_COMMUNITY)
Admission: EM | Admit: 2019-05-25 | Discharge: 2019-05-25 | Disposition: A | Discharge status: Home / Self Care | Payer: Medicare Other | Attending: Emergency Medicine | Admitting: Emergency Medicine

## 2019-05-25 ENCOUNTER — Emergency Department (HOSPITAL_COMMUNITY): Payer: Medicare Other

## 2019-05-25 DIAGNOSIS — I10 Essential (primary) hypertension: Secondary | ICD-10-CM | POA: Insufficient documentation

## 2019-05-25 DIAGNOSIS — E119 Type 2 diabetes mellitus without complications: Secondary | ICD-10-CM | POA: Diagnosis not present

## 2019-05-25 DIAGNOSIS — R0789 Other chest pain: Secondary | ICD-10-CM | POA: Diagnosis not present

## 2019-05-25 DIAGNOSIS — R079 Chest pain, unspecified: Secondary | ICD-10-CM | POA: Diagnosis not present

## 2019-05-25 DIAGNOSIS — D126 Benign neoplasm of colon, unspecified: Secondary | ICD-10-CM | POA: Insufficient documentation

## 2019-05-25 DIAGNOSIS — Z96651 Presence of right artificial knee joint: Secondary | ICD-10-CM | POA: Diagnosis not present

## 2019-05-25 LAB — COMPREHENSIVE METABOLIC PANEL
ALT: 18 U/L (ref 0–44)
AST: 16 U/L (ref 15–41)
Albumin: 4 g/dL (ref 3.5–5.0)
Alkaline Phosphatase: 71 U/L (ref 38–126)
Anion gap: 10 (ref 5–15)
BUN: 28 mg/dL — ABNORMAL HIGH (ref 8–23)
CO2: 27 mmol/L (ref 22–32)
Calcium: 9.8 mg/dL (ref 8.9–10.3)
Chloride: 102 mmol/L (ref 98–111)
Creatinine, Ser: 0.66 mg/dL (ref 0.44–1.00)
GFR calc Af Amer: >60 mL/min (ref >60)
GFR calc non Af Amer: >60 mL/min (ref >60)
Glucose, Bld: 122 mg/dL — ABNORMAL HIGH (ref 70–99)
Potassium: 3.5 mmol/L (ref 3.5–5.1)
Sodium: 139 mmol/L (ref 135–145)
Total Bilirubin: 0.5 mg/dL (ref 0.3–1.2)
Total Protein: 7.3 g/dL (ref 6.5–8.1)

## 2019-05-25 LAB — CBC
HCT: 43.7 % (ref 36.0–46.0)
Hemoglobin: 13.9 g/dL (ref 12.0–15.0)
MCH: 29.1 pg (ref 26.0–34.0)
MCHC: 31.8 g/dL (ref 30.0–36.0)
MCV: 91.4 fL (ref 80.0–100.0)
Platelets: 261 10*3/uL (ref 150–400)
RBC: 4.78 MIL/uL (ref 3.87–5.11)
RDW: 14.3 % (ref 11.5–15.5)
WBC: 8 10*3/uL (ref 4.0–10.5)
nRBC: 0 % (ref 0.0–0.2)

## 2019-05-25 LAB — TROPONIN I (HIGH SENSITIVITY)
Troponin I (High Sensitivity): 5 ng/L (ref <18)
Troponin I (High Sensitivity): 5 ng/L (ref <18)

## 2019-05-25 LAB — LIPASE, BLOOD: Lipase: 16 U/L (ref 11–51)

## 2019-05-25 NOTE — Discharge Instructions (Addendum)
You were evaluated in the Emergency Department and after careful evaluation, we did not find any emergent condition requiring admission or further testing in the hospital.  Your exam/testing today was overall reassuring.  As discussed, we recommend close follow-up with your cardiologist within the next 1 or 2 weeks to discuss your symptoms.  Please return to the Emergency Department if you experience any worsening of your condition.  We encourage you to follow up with a primary care provider.  Thank you for allowing [place] to be a part of your care.

## 2019-05-25 NOTE — Telephone Encounter (Signed)
Agree with approach

## 2019-05-25 NOTE — ED Triage Notes (Signed)
Pt presents to ED with complaints of SOB, chest pressure, weakness x 2 weeks

## 2019-05-25 NOTE — Telephone Encounter (Signed)
Pt called and having sob, weakness and feeling like something is sitting on her chest and feeling like her heart is out of rhythm for the past 2 weeks. Pt advised to go to ED and she states she will go to [facility]. [facility] triage notified.

## 2019-05-25 NOTE — ED Notes (Signed)
ED Provider at bedside. 

## 2019-05-25 NOTE — ED Provider Notes (Signed)
[facility] Emergency Department Provider Note MRN:  [id number]  Arrival date & time: 05/25/19     Chief Complaint   Chest Pain   History of Present Illness   Deborah Gray is a 86 y.o. year-old female with a history of esophageal reflux, esophageal stenosis, diabetes presenting to the ED with chief complaint of chest pain.  Pressure-like pain in the chest intermittently for 2 weeks.  At times worse after meals, at times worse when rushing around the house.  Currently without symptoms.  Denies dizziness, no diaphoresis, no nausea, no vomiting, no diarrhea, no shortness of breath.  Pressure-like pain is mild.  Review of Systems  A complete 10 system review of systems was obtained and all systems are negative except as noted in the HPI and PMH.   Patient's Health History    Past Medical History:  Diagnosis Date  . Arthritis   . Benign neoplasm of colon   . Constipation   . Diaphragmatic hernia without mention of obstruction or gangrene   . Diverticulosis of colon (without mention of hemorrhage)   . Dysphagia, pharyngoesophageal phase   . Esophageal reflux   . Essential hypertension   . Heart murmur   . Hemorrhoids   . Hyperlipidemia   . Internal hemorrhoids without mention of complication   . Left wrist fracture   . PONV (postoperative nausea and vomiting)   . Stricture and stenosis of esophagus   . Type 2 diabetes mellitus ([place])   . Unspecified gastritis and gastroduodenitis without mention of hemorrhage     Past Surgical History:  Procedure Laterality Date  . ABDOMINAL HYSTERECTOMY    . BACTERIAL OVERGROWTH TEST N/A 09/06/2015   Procedure: BACTERIAL OVERGROWTH TEST;  Surgeon: Daneil Dolin, MD;  Location: [facility];  Service: Endoscopy;  Laterality: N/A;  800  . BREAST LUMPECTOMY Right    benign  . CATARACT EXTRACTION Bilateral 2006   Implants in both, surgeries done 6 weeks apart  . CHOLECYSTECTOMY    . COLONOSCOPY  03/08/02   [name]: diverticulosis, internal hemorrhoids, adenomatous colon polyp  . COLONOSCOPY  01/23/04   [name]: diverticulosis, internal hemorrhoids  . COLONOSCOPY  09/25/05   [name]: diverticulosis  . COLONOSCOPY  07/05/09   [name]: severe diverticulosis  . COLONOSCOPY  01/21/11   [name]: severe diverticulosis in sigmoid to desc colon, int hemorrhoids, follow up TCS in 5 years  . COLONOSCOPY N/A 04/10/2016   Procedure: COLONOSCOPY;  Surgeon: Daneil Dolin, MD;  Location: [facility];  Service: Endoscopy;  Laterality: N/A;  12:30 PM  . DILATION AND CURETTAGE OF UTERUS    . ESOPHAGEAL MANOMETRY  03/21/08   Patterson: findings c/w [place] Esophagus  . ESOPHAGOGASTRODUODENOSCOPY  03/08/02   [name]: esophageal stricture, chronic gerd, s/p dilation.   . ESOPHAGOGASTRODUODENOSCOPY  01/23/04   [name]: esophageal stricture, gastritis, hiatal hernia  . ESOPHAGOGASTRODUODENOSCOPY  09/25/05   Patterson: gastritis, benign bx, no H.pylori  . ESOPHAGOGASTRODUODENOSCOPY  07/05/09   Patterson: gastropathy, benign small bowel bx and gastric bx  . ESOPHAGOGASTRODUODENOSCOPY N/A 05/15/2015   Dr. Gala Romney- diffuse moderate inflammation characterized by congestion (edema), erythema, and linear erosions was found in the entire examined stomach. bx= reactive gastropathy  . FOOT SURGERY Left   . HEMORRHOID BANDING  2017   Dr.[name]  . LAPAROSCOPIC VAGINAL HYSTERECTOMY    . ORIF WRIST FRACTURE Left 06/23/2018   Procedure: OPEN REDUCTION INTERNAL FIXATION (ORIF) LEFT WRIST FRACTURE;  Surgeon: Renette Butters, MD;  Location: [facility];  Service: Orthopedics;  Laterality: Left;  regional arm block  . RECTOCELE REPAIR    . TOTAL KNEE ARTHROPLASTY Right     Family History  Problem Relation Age of Onset  . Ovarian cancer Mother   . Diabetes Father   . Heart disease Father   . Breast cancer Sister   . Liver cancer Sister   . Colon cancer Cousin        Paternal side  . Diabetes Maternal  Aunt   . Liver disease Cousin        Maternal side, never drank    Social History   Socioeconomic History  . Marital status: Married    Spouse name: Not on file  . Number of children: 3  . Years of education: Not on file  . Highest education level: Not on file  Occupational History  . Occupation: Retired  Tobacco Use  . Smoking status: Never Smoker  . Smokeless tobacco: Never Used  . Tobacco comment: Never smoked  Substance and Sexual Activity  . Alcohol use: No    Alcohol/week: 0.0 standard drinks  . Drug use: No  . Sexual activity: Not on file  Other Topics Concern  . Not on file  Social History Narrative  . Not on file   Social Determinants of Health   Financial Resource Strain:   . Difficulty of Paying Living Expenses:   Food Insecurity:   . Worried About Charity fundraiser in the Last Year:   . Arboriculturist in the Last Year:   Transportation Needs:   . Film/video editor (Medical):   Marland Kitchen Lack of Transportation (Non-Medical):   Physical Activity:   . Days of Exercise per Week:   . Minutes of Exercise per Session:   Stress:   . Feeling of Stress :   Social Connections:   . Frequency of Communication with Friends and Family:   . Frequency of Social Gatherings with Friends and Family:   . Attends Religious Services:   . Active Member of Clubs or Organizations:   . Attends Archivist Meetings:   Marland Kitchen Marital Status:   Intimate Partner Violence:   . Fear of Current or Ex-Partner:   . Emotionally Abused:   Marland Kitchen Physically Abused:   . Sexually Abused:      Physical Exam   Vitals:   05/25/19 1453 05/25/19 1500  BP:  140/71  Pulse: 89 91  Resp: 17 (!) 21  Temp:    SpO2: 100% 100%    CONSTITUTIONAL: Well-appearing, NAD NEURO:  Alert and oriented x 3, no focal deficits EYES:  eyes equal and reactive ENT/NECK:  no LAD, no JVD CARDIO: Regular rate, well-perfused, normal S1 and S2 PULM:  CTAB no wheezing or rhonchi GI/GU:  normal bowel sounds,  non-distended, non-tender MSK/SPINE:  No gross deformities, no edema SKIN:  no rash, atraumatic PSYCH:  Appropriate speech and behavior  *Additional and/or pertinent findings included in MDM below  Diagnostic and Interventional Summary    EKG Interpretation  Date/Time:  Tuesday May 25 2019 12:09:40 EDT Ventricular Rate:  91 PR Interval:    QRS Duration: 94 QT Interval:  345 QTC Calculation: 425 R Axis:   45 Text Interpretation: Sinus rhythm Low voltage, precordial leads Probable anteroseptal infarct, old Minimal ST elevation, inferior leads Confirmed by Gerlene Fee [phone]) on 05/25/2019 12:24:41 PM      Labs Reviewed  COMPREHENSIVE METABOLIC PANEL - Abnormal; Notable for the following components:  Result Value   Glucose, Bld 122 (*)    BUN 28 (*)    All other components within normal limits  CBC  LIPASE, BLOOD  TROPONIN I (HIGH SENSITIVITY)  TROPONIN I (HIGH SENSITIVITY)    DG Chest Port 1 View  Final Result      Medications - No data to display   Procedures  /  Critical Care Procedures  ED Course and Medical Decision Making  I have reviewed the triage vital signs, the nursing notes, and pertinent available records from the EMR.  Listed above are laboratory and imaging tests that I personally ordered, reviewed, and interpreted and then considered in my medical decision making (see below for details).      Chest pain for the past few weeks, considering GI versus cardiac etiology.  EKG is very reassuring, troponin negative x2.  Given that work-up today is reassuring and her discomfort is worse with meals, it seems appropriate that she can follow-up with her cardiologist within the next few weeks.  Patient is agreeable with this plan and will follow strict return precautions for worsening pain.    Barth Kirks. Sedonia Small, MD [place] [email or web address]  Final Clinical Impressions(s) / ED Diagnoses     ICD-10-CM    1. Chest pain, unspecified type  R07.9     ED Discharge Orders    None       Discharge Instructions Discussed with and Provided to Patient:     Discharge Instructions     You were evaluated in the Emergency Department and after careful evaluation, we did not find any emergent condition requiring admission or further testing in the hospital.  Your exam/testing today was overall reassuring.  As discussed, we recommend close follow-up with your cardiologist within the next 1 or 2 weeks to discuss your symptoms.  Please return to the Emergency Department if you experience any worsening of your condition.  We encourage you to follow up with a primary care provider.  Thank you for allowing [place] to be a part of your care.       Maudie Flakes, MD 05/25/19 [phone]

## 2019-05-26 ENCOUNTER — Telehealth: Payer: Self-pay

## 2019-05-26 NOTE — Telephone Encounter (Signed)
Pt states Dr.Luking asked her to call for an appt. Pt has not seen Cardiology in the past. Needs referring by Luking??  thanks renee

## 2019-05-26 NOTE — Telephone Encounter (Signed)
Returned pt call. Appt made for 4/8 with Bernerd Pho, PA-C

## 2019-05-27 ENCOUNTER — Ambulatory Visit (INDEPENDENT_AMBULATORY_CARE_PROVIDER_SITE_OTHER): Payer: Medicare Other | Admitting: Student

## 2019-05-27 ENCOUNTER — Encounter: Payer: Self-pay | Admitting: Student

## 2019-05-27 ENCOUNTER — Encounter: Payer: Self-pay | Admitting: *Deleted

## 2019-05-27 ENCOUNTER — Other Ambulatory Visit: Payer: Self-pay

## 2019-05-27 VITALS — BP 122/62 | HR 96 | Ht 64.0 in | Wt 137.0 lb

## 2019-05-27 DIAGNOSIS — R0600 Dyspnea, unspecified: Secondary | ICD-10-CM | POA: Diagnosis not present

## 2019-05-27 DIAGNOSIS — R0609 Other forms of dyspnea: Secondary | ICD-10-CM

## 2019-05-27 DIAGNOSIS — R072 Precordial pain: Secondary | ICD-10-CM

## 2019-05-27 DIAGNOSIS — E785 Hyperlipidemia, unspecified: Secondary | ICD-10-CM

## 2019-05-27 DIAGNOSIS — I471 Supraventricular tachycardia: Secondary | ICD-10-CM

## 2019-05-27 DIAGNOSIS — I10 Essential (primary) hypertension: Secondary | ICD-10-CM

## 2019-05-27 NOTE — Patient Instructions (Signed)
Medication Instructions:  Your physician recommends that you continue on your current medications as directed. Please refer to the Current Medication list given to you today.  *If you need a refill on your cardiac medications before your next appointment, please call your pharmacy*   Lab Work: NONE   If you have labs (blood work) drawn today and your tests are completely normal, you will receive your results only by: Marland Kitchen MyChart Message (if you have MyChart) OR . A paper copy in the mail If you have any lab test that is abnormal or we need to change your treatment, we will call you to review the results.   Testing/Procedures: Your physician has requested that you have a lexiscan myoview. For further information please visit [email or web address]. Please follow instruction sheet, as given.  Follow-Up: At [facility], you and your health needs are our priority.  As part of our continuing mission to provide you with exceptional heart care, we have created designated Provider Care Teams.  These Care Teams include your primary Cardiologist (physician) and Advanced Practice Providers (APPs -  Physician Assistants and Nurse Practitioners) who all work together to provide you with the care you need, when you need it.  We recommend signing up for the patient portal called "MyChart".  Sign up information is provided on this After Visit Summary.  MyChart is used to connect with patients for Virtual Visits (Telemedicine).  Patients are able to view lab/test results, encounter notes, upcoming appointments, etc.  Non-urgent messages can be sent to your provider as well.   To learn more about what you can do with MyChart, go to [email or web address].    Your next appointment:   6 month(s)  The format for your next appointment:   In Person  Provider:   Kate Sable, MD   Other Instructions Thank you for choosing [place]!

## 2019-05-27 NOTE — Progress Notes (Signed)
Cardiology Office Note    Date:  05/28/2019   ID:  Deborah Gray, DOB March 27, 1933, MRN [id number]  PCP:  [name], MD  Cardiologist: Kate Sable, MD    Chief Complaint  Patient presents with   Follow-up    recent Emergency Dept visit    History of Present Illness:    Deborah Gray is a 86 y.o. female with past medical history of chest pain (negative stress testing in 2006, 2015 and 07/2015), pSVT and HTN who presents to the office today for Emergency Department follow-up.   She was last examined by Ermalinda Barrios, PA-C in 12/2018 and reported occasional palpitations but denied any chest pain. Breathing had overall been stable. No changes were made to her medication regimen and she was informed to follow-up in 1 year.  She most recently presented to [facility] ED on 05/25/2019 for evaluation of worsening dyspnea and weakness. Also reported intermittent episodes of chest discomfort for the past 2 weeks which was typically worse after food consumption. Lab work showed WBC 8.0, Hgb 13.9, platelets 261, Na+ 139, K+ 3.5 and creatinine 0.66. Initial and delta HS Troponin values were negative at 5. EKG showed NSR, HR 91 with no acute ST abnormalities when compared to prior tracings. Her pain was thought to be likely of a GI etiology and outpatient Cardiology follow-up was recommended.  In talking with the patient today, it is challenging to obtain her recent history as she alternates between talking about more recent events and symptoms that she had over a year and a half ago. Her husband of 97 years passed away last summer and she reports worsening lower extremity weakness since then. On the day of ED evaluation, she did have pressure along her epigastric and sternal region which occurred when walking around her home. Symptoms resolved with rest. Was not associated with food consumption and felt different from her prior GERD. She does report worsening dyspnea on exertion over  the past few months as well.  No orthopnea, PND or lower extremity edema.   Past Medical History:  Diagnosis Date   Arthritis    Benign neoplasm of colon    Constipation    Diaphragmatic hernia without mention of obstruction or gangrene    Diverticulosis of colon (without mention of hemorrhage)    Dysphagia, pharyngoesophageal phase    Esophageal reflux    Essential hypertension    Heart murmur    Hemorrhoids    Hyperlipidemia    Internal hemorrhoids without mention of complication    Left wrist fracture    PONV (postoperative nausea and vomiting)    Stricture and stenosis of esophagus    Type 2 diabetes mellitus (HCC)    Unspecified gastritis and gastroduodenitis without mention of hemorrhage     Past Surgical History:  Procedure Laterality Date   ABDOMINAL HYSTERECTOMY     BACTERIAL OVERGROWTH TEST N/A 09/06/2015   Procedure: BACTERIAL OVERGROWTH TEST;  Surgeon: Daneil Dolin, MD;  [facility];  Service: Endoscopy;  Laterality: N/A;  800   BREAST LUMPECTOMY Right    benign   CATARACT EXTRACTION Bilateral 2006   Implants in both, surgeries done 6 weeks apart   CHOLECYSTECTOMY     COLONOSCOPY  03/08/02   [name]: diverticulosis, internal hemorrhoids, adenomatous colon polyp   COLONOSCOPY  01/23/04   [name]: diverticulosis, internal hemorrhoids   COLONOSCOPY  09/25/05   [name]: diverticulosis   COLONOSCOPY  07/05/09   [name]: severe diverticulosis  COLONOSCOPY  01/21/11   [name]: severe diverticulosis in sigmoid to desc colon, int hemorrhoids, follow up TCS in 5 years   COLONOSCOPY N/A 04/10/2016   Procedure: COLONOSCOPY;  Surgeon: Daneil Dolin, MD;  Location: [facility];  Service: Endoscopy;  Laterality: N/A;  12:30 PM   DILATION AND CURETTAGE OF UTERUS     ESOPHAGEAL MANOMETRY  03/21/08   [place]: findings c/w Nutcracker Esophagus   ESOPHAGOGASTRODUODENOSCOPY  03/08/02   [name]: esophageal stricture, chronic  gerd, s/p dilation.    ESOPHAGOGASTRODUODENOSCOPY  01/23/04   [name]: esophageal stricture, gastritis, hiatal hernia   ESOPHAGOGASTRODUODENOSCOPY  09/25/05   [name]: gastritis, benign bx, no H.pylori   ESOPHAGOGASTRODUODENOSCOPY  07/05/09   [name]: gastropathy, benign small bowel bx and gastric bx   ESOPHAGOGASTRODUODENOSCOPY N/A 05/15/2015   Dr. Gala Romney- diffuse moderate inflammation characterized by congestion (edema), erythema, and linear erosions was found in the entire examined stomach. bx= reactive gastropathy   FOOT SURGERY Left    HEMORRHOID BANDING  2017   Dr.[name]   LAPAROSCOPIC VAGINAL HYSTERECTOMY     ORIF WRIST FRACTURE Left 06/23/2018   Procedure: OPEN REDUCTION INTERNAL FIXATION (ORIF) LEFT WRIST FRACTURE;  Surgeon: Renette Butters, MD;  Location: [place];  Service: Orthopedics;  Laterality: Left;  regional arm block   RECTOCELE REPAIR     TOTAL KNEE ARTHROPLASTY Right     Current Medications: Outpatient Medications Prior to Visit  Medication Sig Dispense Refill   acetaminophen (TYLENOL) 500 MG tablet Take 500 mg at bedtime by mouth.      ascorbic acid (VITAMIN C) 500 MG tablet Take 1 tablet by mouth daily.     aspirin EC 81 MG tablet Take 81 mg by mouth at bedtime.     azelastine (ASTELIN) 0.1 % nasal spray Place 1 spray into both nostrils 2 (two) times daily as needed for rhinitis. 30 mL 5   blood glucose meter kit and supplies KIT Dispense based on  insurance preference. Tests once a day E11.9 1 each 0   calcium carbonate (OS-CAL) 600 MG TABS tablet Take 1 tablet by mouth in the morning and at bedtime.     Carboxymethylcellulose Sodium (THERATEARS) 0.25 % SOLN Apply 1 drop to eye 2 (two) times daily.      clidinium-chlordiazePOXIDE (LIBRAX) 5-2.5 MG capsule Take 1 capsule by mouth 4 (four) times daily as needed.     ezetimibe (ZETIA) 10 MG tablet TAKE 1 TABLET BY MOUTH EVERY DAY 90 tablet 3   guaiFENesin (MUCINEX) 600 MG 12 hr  tablet Take 600 mg by mouth daily.      hyoscyamine (LEVSIN SL) 0.125 MG SL tablet Place 1 tablet (0.125 mg total) under the tongue every 4 (four) hours as needed. Use one under the tongue before meals and at bedtime as needed for flares in symptoms. 30 tablet 3   Hyoscyamine Sulfate 0.375 MG TBCR Take 1 tablet (0.375 mg total) by mouth daily after breakfast. 30 tablet 11   imipramine (TOFRANIL) 10 MG tablet TAKE 1 TABLET BY MOUTH EVERYDAY AT BEDTIME 90 tablet 3   losartan (COZAAR) 50 MG tablet Take 1 tablet (50 mg total) by mouth every evening. 90 tablet 3   meclizine (ANTIVERT) 25 MG tablet TAKE 1 TABLET (25 MG TOTAL) BY MOUTH 3 (THREE) TIMES DAILY AS NEEDED FOR DIZZINESS OR NAUSEA. 30 tablet 3   metFORMIN (GLUCOPHAGE) 500 MG tablet TAKE ONE TABLET BY MOUTH EVERY MORNING ONE AT LUNCH AND 2 AT BEDTIME 360 tablet 1  methenamine (HIPREX) 1 g tablet Take 1 tablet (1 g total) by mouth 2 (two) times daily. 180 tablet 3   mirabegron ER (MYRBETRIQ) 25 MG TB24 tablet Take 50 mg every morning by mouth.      Multiple Vitamins-Minerals (PRESERVISION AREDS 2) CAPS Take 1 capsule by mouth 2 (two) times daily.      nateglinide (STARLIX) 120 MG tablet TAKE 1 TABLET BY MOUTH 3 TIMES A DAY BEFORE MEALS 270 tablet 1   NON FORMULARY Phillips probiotic  Once a day     omega-3 acid ethyl esters (LOVAZA) 1 g capsule Take 3 g by mouth daily.      omeprazole (PRILOSEC) 20 MG capsule Take 1 capsule (20 mg total) by mouth daily. 30 capsule 11   OVER THE COUNTER MEDICATION Vitamin D     One a day  Not sure strength     Pancrelipase, Lip-Prot-Amyl, (CREON) 24000-76000 units CPEP TAKE 3 CAPSULES THREE TIMES DAILY WITH MEALS AND ONE CAPSULE WITH SNACK TWICE DAILY 990 capsule 2   vitamin E 400 UNIT capsule Take 400 Units by mouth 4 (four) times a week. Pt takes on Tuesday, Thursday, Saturday, and Sunday.     Vitamin Mixture (ESTER-C) 500-60 MG TABS Take 1 tablet by mouth daily.      No facility-administered  medications prior to visit.     Allergies:   Adhesive [tape], Estrogens, and Sulfonamide derivatives   Social History   Socioeconomic History   Marital status: Married    Spouse name: Not on file   Number of children: 3   Years of education: Not on file   Highest education level: Not on file  Occupational History   Occupation: Retired  Tobacco Use   Smoking status: Never Smoker   Smokeless tobacco: Never Used   Tobacco comment: Never smoked  Substance and Sexual Activity   Alcohol use: No    Alcohol/week: 0.0 standard drinks   Drug use: No   Sexual activity: Not on file  Other Topics Concern   Not on file  Social History Narrative   Not on file   Social Determinants of Health   Financial Resource Strain:    Difficulty of Paying Living Expenses:   Food Insecurity:    Worried About Charity fundraiser in the Last Year:    Arboriculturist in the Last Year:   Transportation Needs:    Film/video editor (Medical):    Lack of Transportation (Non-Medical):   Physical Activity:    Days of Exercise per Week:    Minutes of Exercise per Session:   Stress:    Feeling of Stress :   Social Connections:    Frequency of Communication with Friends and Family:    Frequency of Social Gatherings with Friends and Family:    Attends Religious Services:    Active Member of Clubs or Organizations:    Attends Music therapist:    Marital Status:      Family History:  The patient's family history includes Breast cancer in her sister; Colon cancer in her cousin; Diabetes in her father and maternal aunt; Heart disease in her father; Liver cancer in her sister; Liver disease in her cousin; Ovarian cancer in her mother.   Review of Systems:   Please see the history of present illness.     General:  No chills, fever, night sweats or weight changes.  Cardiovascular:  No edema, orthopnea, palpitations, paroxysmal nocturnal dyspnea. Positive for  chest pain and dyspnea on exertion.  Dermatological: No rash, lesions/masses Respiratory: No cough, dyspnea Urologic: No hematuria, dysuria Abdominal:   No nausea, vomiting, diarrhea, bright red blood per rectum, melena, or hematemesis Neurologic:  No visual changes, wkns, changes in mental status. All other systems reviewed and are otherwise negative except as noted above.   Physical Exam:    VS:  BP 122/62    Pulse 96    Ht '5\' 4"'$  (1.626 m)    Wt 137 lb (62.1 kg)    SpO2 97%    BMI 23.52 kg/m    General: Well developed, well nourished,female appearing in no acute distress. Head: Normocephalic, atraumatic, sclera non-icteric.  Neck: No carotid bruits. JVD not elevated.  Lungs: Respirations regular and unlabored, without wheezes or rales.  Heart: Regular rate and rhythm. No S3 or S4.  No murmur, no rubs, or gallops appreciated. Abdomen: Soft, non-tender, non-distended. No obvious abdominal masses. Msk:  Strength and tone appear normal for age. No obvious joint deformities or effusions. Extremities: No clubbing or cyanosis. No lower extremity edema.  Distal pedal pulses are 2+ bilaterally. Varicose veins noted.  Neuro: Alert and oriented X 3. Moves all extremities spontaneously. No focal deficits noted. Psych:  Responds to questions appropriately with a normal affect. Skin: No rashes or lesions noted  Wt Readings from Last 3 Encounters:  05/27/19 137 lb (62.1 kg)  05/25/19 135 lb (61.2 kg)  05/11/19 139 lb 9.6 oz (63.3 kg)      Studies/Labs Reviewed:   EKG:  EKG is not ordered today. EKG from 05/25/2019 is personally reviewed and shows NSR, HR 91 with no acute ST abnormalities when compared to prior tracings.  Recent Labs: 05/25/2019: ALT 18; BUN 28; Creatinine, Ser 0.66; Hemoglobin 13.9; Platelets 261; Potassium 3.5; Sodium 139   Lipid Panel    Component Value Date/Time   CHOL 177 12/14/2018 1100   TRIG 71 12/14/2018 1100   HDL 76 12/14/2018 1100   CHOLHDL 2.3 12/14/2018 1100     CHOLHDL 3.6 11/22/2013 0852   VLDL 29 11/22/2013 0852   LDLCALC 88 12/14/2018 1100    Additional studies/ records that were reviewed today include:   Echocardiogram: 11/2013 Study Conclusions   - Left ventricle: The cavity size was normal. Wall thickness was  increased in a pattern of mild LVH. Systolic function was  vigorous. The estimated ejection fraction was in the range of 65%  to 70%. Wall motion was normal; there were no regional wall  motion abnormalities. Doppler parameters are consistent with  abnormal left ventricular relaxation (grade 1 diastolic  dysfunction). Doppler parameters are consistent with elevated  ventricular end-diastolic filling pressure.  - Aortic valve: Trileaflet; mildly calcified leaflets. Moderate  thickening and calcification involving the left coronary cusp.  There was no significant regurgitation.  - Mitral valve: There was trivial regurgitation.  - Right atrium: Central venous pressure (est): 3 mm Hg.  - Atrial septum: No defect or patent foramen ovale was identified.  - Tricuspid valve: There was trivial regurgitation.  - Pulmonary arteries: PA peak pressure: 32 mm Hg (S).  - Pericardium, extracardiac: There was no pericardial effusion.   Impressions:   - Mild LVH with LVEF 35-59%, grade 1 diastolic dysfunction with  increased filling pressures. Mildly sclerotic aortic valve.  Trivial mitral and tricuspid regurgitation. Upper normal PASP 32  mm mercury.   NST: 07/2015  There was no ST segment deviation noted during stress.  No T wave inversion was noted during stress.  The study is normal.  This is a low risk study.  The left ventricular ejection fraction is hyperdynamic (>65%).  Assessment:    1. Dyspnea on exertion   2. Precordial pain   3. SVT (supraventricular tachycardia) ([place])   4. Essential hypertension   5. Hyperlipidemia, unspecified hyperlipidemia type      Plan:   In order of problems  listed above:  1. Dyspnea on Exertion/ Precordial Pain - she describes worsening dyspnea on exertion for the past several months which is likely multifactorial but I suspect deconditioning is playing a role as well. She has restarted using her exercise bike intermittently. Her episodes of chest discomfort over the past few weeks have felt different from her typical GERD as they occur with activity and improve with rest which is more concerning for angina.  - recent ED evaluation was reassuring as HS Troponin values were negative and her EKG showed no acute ST changes. Reviewed options with the patient and will plan to proceed with a repeat Lexiscan Myoview for further ischemic testing as her last test was in 2017. She is unable to walk on a treadmill secondary to arthritis.  - continue ASA and Zetia.   2. SVT - she denies any recurrent palpitations. No longer on BB therapy.   3. HTN - BP is well-controlled at 122/62 during today's visit. Continue Losartan '50mg'$  daily.   4. HLD - followed by PCP. FLP in 11/2018 showed total cholesterol of 177, HDL 76 and LDL 88. Remains on Zetia.    Medication Adjustments/Labs and Tests Ordered: Current medicines are reviewed at length with the patient today.  Concerns regarding medicines are outlined above.  Medication changes, Labs and Tests ordered today are listed in the Patient Instructions below. Patient Instructions  Medication Instructions:  Your physician recommends that you continue on your current medications as directed. Please refer to the Current Medication list given to you today.  *If you need a refill on your cardiac medications before your next appointment, please call your pharmacy*   Lab Work: NONE   If you have labs (blood work) drawn today and your tests are completely normal, you will receive your results only by:  [place] (if you have MyChart) OR  A paper copy in the mail If you have any lab test that is abnormal or we  need to change your treatment, we will call you to review the results.   Testing/Procedures: Your physician has requested that you have a lexiscan myoview. For further information please visit [email or web address]. Please follow instruction sheet, as given.  Follow-Up: At [facility], you and your health needs are our priority.  As part of our continuing mission to provide you with exceptional heart care, we have created designated Provider Care Teams.  These Care Teams include your primary Cardiologist (physician) and Advanced Practice Providers (APPs -  Physician Assistants and Nurse Practitioners) who all work together to provide you with the care you need, when you need it.  We recommend signing up for the patient portal called "MyChart".  Sign up information is provided on this After Visit Summary.  MyChart is used to connect with patients for Virtual Visits (Telemedicine).  Patients are able to view lab/test results, encounter notes, upcoming appointments, etc.  Non-urgent messages can be sent to your provider as well.   To learn more about what you can do with MyChart, go to [email or web address].    Your next appointment:   6 month(s)  The format for your  next appointment:   In Person  Provider:   Kate Sable, MD   Other Instructions Thank you for choosing [facility]!       Signed, Erma Heritage, PA-C  05/28/2019 8:57 AM    [place] [address] [place], [place] [address] Phone: [phone] Fax: [phone]

## 2019-05-28 ENCOUNTER — Encounter: Payer: Self-pay | Admitting: Student

## 2019-05-31 ENCOUNTER — Other Ambulatory Visit: Payer: Self-pay

## 2019-05-31 ENCOUNTER — Telehealth: Payer: Self-pay | Admitting: *Deleted

## 2019-05-31 ENCOUNTER — Telehealth: Payer: Self-pay | Admitting: Internal Medicine

## 2019-05-31 NOTE — Telephone Encounter (Signed)
Spoke with pt. Pt wants to try taking the 40 mg of Omeprazole and will call back if she needs an RX. Pt has Omeprazole 20 mg at home and is going to take 2 capsules to see if this helps. Pt will f/u with her cardiologist. Pt has a stress test coming up soon.

## 2019-05-31 NOTE — Telephone Encounter (Signed)
Communication noted.  Fullness behind her breast may or may not be related to reflux.  She needs to see PCP to make sure it is not a cardiac issue.  For now, we will have her just increase omeprazole to 40 mg daily -but I also recommend further evaluation for potential cardiac etiology.

## 2019-05-31 NOTE — Progress Notes (Signed)
omep

## 2019-05-31 NOTE — Telephone Encounter (Signed)
Pt said that she had a terrible time with GERD over the weekend.  She said she had pain behind her breast bone.  She took some Tums and anti-acid liquid ([facility] brand generic Mylanta).  Pt has been using heating pad which she said provides some relief.  Pt wants to know if we can prescribe her something to help.  She takes Chlordiazepox-IDE for spasms.  [phone]

## 2019-05-31 NOTE — Telephone Encounter (Signed)
Spoke with pt. Pt is taking Omeprazole 20 mg qam before breakfast, tums prn, anti acid otc liquid prn. Pt is having a full feeling after she eats a meal, burning sensations and pain that comes and goes in her upper abdomen under her rib cage.

## 2019-06-01 DIAGNOSIS — L280 Lichen simplex chronicus: Secondary | ICD-10-CM | POA: Diagnosis not present

## 2019-06-01 DIAGNOSIS — L309 Dermatitis, unspecified: Secondary | ICD-10-CM | POA: Diagnosis not present

## 2019-06-01 DIAGNOSIS — L304 Erythema intertrigo: Secondary | ICD-10-CM | POA: Diagnosis not present

## 2019-06-02 ENCOUNTER — Encounter: Payer: Self-pay | Admitting: Family Medicine

## 2019-06-02 ENCOUNTER — Other Ambulatory Visit: Payer: Self-pay

## 2019-06-02 ENCOUNTER — Ambulatory Visit (INDEPENDENT_AMBULATORY_CARE_PROVIDER_SITE_OTHER): Payer: BC Managed Care – PPO | Admitting: Family Medicine

## 2019-06-02 VITALS — BP 140/76 | HR 90 | Temp 97.4°F | Wt 137.8 lb

## 2019-06-02 DIAGNOSIS — R2681 Unsteadiness on feet: Secondary | ICD-10-CM | POA: Diagnosis not present

## 2019-06-02 DIAGNOSIS — I10 Essential (primary) hypertension: Secondary | ICD-10-CM

## 2019-06-02 DIAGNOSIS — E785 Hyperlipidemia, unspecified: Secondary | ICD-10-CM | POA: Diagnosis not present

## 2019-06-02 DIAGNOSIS — E1100 Type 2 diabetes mellitus with hyperosmolarity without nonketotic hyperglycemic-hyperosmolar coma (NKHHC): Secondary | ICD-10-CM | POA: Diagnosis not present

## 2019-06-02 DIAGNOSIS — Z79899 Other long term (current) drug therapy: Secondary | ICD-10-CM

## 2019-06-02 MED ORDER — NATEGLINIDE 120 MG PO TABS: ORAL_TABLET | ORAL | 1 refills | Status: DC

## 2019-06-02 MED ORDER — METFORMIN HCL 500 MG PO TABS: ORAL_TABLET | ORAL | 1 refills | Status: DC
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
## 2019-06-03 ENCOUNTER — Encounter: Payer: Self-pay | Admitting: Family Medicine

## 2019-06-04 DIAGNOSIS — E1100 Type 2 diabetes mellitus with hyperosmolarity without nonketotic hyperglycemic-hyperosmolar coma (NKHHC): Secondary | ICD-10-CM | POA: Diagnosis not present

## 2019-06-04 DIAGNOSIS — Z79899 Other long term (current) drug therapy: Secondary | ICD-10-CM | POA: Diagnosis not present

## 2019-06-04 DIAGNOSIS — E785 Hyperlipidemia, unspecified: Secondary | ICD-10-CM | POA: Diagnosis not present

## 2019-06-05 LAB — LIPID PANEL
Chol/HDL Ratio: 2.5 ratio (ref 0.0–4.4)
Cholesterol, Total: 158 mg/dL (ref 100–199)
HDL: 62 mg/dL (ref >39)
LDL Chol Calc (NIH): 82 mg/dL (ref 0–99)
Triglycerides: 71 mg/dL (ref 0–149)
VLDL Cholesterol Cal: 14 mg/dL (ref 5–40)

## 2019-06-05 LAB — HEMOGLOBIN A1C
Est. average glucose Bld gHb Est-mCnc: 146 mg/dL
Hgb A1c MFr Bld: 6.7 % — ABNORMAL HIGH (ref 4.8–5.6)

## 2019-06-05 LAB — HEPATIC FUNCTION PANEL
ALT: 11 IU/L (ref 0–32)
AST: 14 IU/L (ref 0–40)
Albumin: 4.6 g/dL (ref 3.6–4.6)
Alkaline Phosphatase: 86 IU/L (ref 39–117)
Bilirubin Total: 0.2 mg/dL (ref 0.0–1.2)
Bilirubin, Direct: 0.08 mg/dL (ref 0.00–0.40)
Total Protein: 6.7 g/dL (ref 6.0–8.5)

## 2019-06-06 ENCOUNTER — Encounter: Payer: Self-pay | Admitting: Family Medicine

## 2019-06-07 ENCOUNTER — Encounter: Payer: Self-pay | Admitting: Family Medicine

## 2019-06-07 DIAGNOSIS — H353132 Nonexudative age-related macular degeneration, bilateral, intermediate dry stage: Secondary | ICD-10-CM | POA: Diagnosis not present

## 2019-06-07 DIAGNOSIS — H34832 Tributary (branch) retinal vein occlusion, left eye, with macular edema: Secondary | ICD-10-CM | POA: Diagnosis not present

## 2019-06-07 LAB — HM DIABETES EYE EXAM

## 2019-06-09 ENCOUNTER — Other Ambulatory Visit: Payer: Self-pay

## 2019-06-09 ENCOUNTER — Ambulatory Visit (INDEPENDENT_AMBULATORY_CARE_PROVIDER_SITE_OTHER): Payer: Medicare Other

## 2019-06-09 DIAGNOSIS — N3010 Interstitial cystitis (chronic) without hematuria: Secondary | ICD-10-CM

## 2019-06-09 LAB — POCT URINALYSIS DIPSTICK
Bilirubin, UA: NEGATIVE
Glucose, UA: NEGATIVE
Ketones, UA: NEGATIVE
Leukocytes, UA: NEGATIVE
Nitrite, UA: NEGATIVE
Protein, UA: NEGATIVE
Spec Grav, UA: <=1.005 — AB (ref 1.010–1.025)
Urobilinogen, UA: 0.2 E.U./dL
pH, UA: 7 (ref 5.0–8.0)

## 2019-06-09 MED ORDER — LIDOCAINE HCL 1 % IJ SOLN
20.0000 mL | Freq: Once | INTRAMUSCULAR | Status: AC
  Administered 2019-06-09: 20 mL

## 2019-06-09 MED ORDER — DIMETHYL SULFOXIDE 50 % IS SOLN
50.0000 mL | Freq: Once | INTRAVESICAL | Status: AC
  Administered 2019-06-09: 50 mL via URETHRAL

## 2019-06-09 MED ORDER — HYDROCORTISONE NA SUCCINATE PF 100 MG IJ SOLR
100.0000 mg | Freq: Once | INTRAMUSCULAR | Status: AC
  Administered 2019-06-09: 100 mg

## 2019-06-09 MED ORDER — SODIUM BICARBONATE 8.4 % IV SOLN
50.0000 meq | Freq: Once | INTRAVENOUS | Status: AC
  Administered 2019-06-09: 50 meq via INTRAVENOUS

## 2019-06-09 NOTE — Progress Notes (Signed)
Bladder Instillation DMSO  Due to IC patient is present today for a Bladder Instillation of DMSO treatment. Patient was cleaned and prepped in a sterile fashion with betadine.  A 18FR catheter was inserted, urine return was noted 114ml, urine was yellow in color.  DMSO treatment was instilled into the bladder. The catheter was then removed. Patient tolerated well, no complications were noted pt was instructed to hold the instillation for 20 minutes and then will void it out. Pt voiced understanding.    Preformed by: Gayland Nicol LPN  Follow up/ Additional notes: 1 month [place] DMSO tx.

## 2019-06-10 DIAGNOSIS — S134XXA Sprain of ligaments of cervical spine, initial encounter: Secondary | ICD-10-CM | POA: Diagnosis not present

## 2019-06-10 DIAGNOSIS — S233XXA Sprain of ligaments of thoracic spine, initial encounter: Secondary | ICD-10-CM | POA: Diagnosis not present

## 2019-06-10 DIAGNOSIS — M9902 Segmental and somatic dysfunction of thoracic region: Secondary | ICD-10-CM | POA: Diagnosis not present

## 2019-06-10 DIAGNOSIS — M9901 Segmental and somatic dysfunction of cervical region: Secondary | ICD-10-CM | POA: Diagnosis not present

## 2019-06-10 DIAGNOSIS — S338XXA Sprain of other parts of lumbar spine and pelvis, initial encounter: Secondary | ICD-10-CM | POA: Diagnosis not present

## 2019-06-16 DIAGNOSIS — E119 Type 2 diabetes mellitus without complications: Secondary | ICD-10-CM | POA: Diagnosis not present

## 2019-06-21 ENCOUNTER — Encounter (HOSPITAL_COMMUNITY): Payer: Self-pay | Admitting: Physical Therapy

## 2019-06-21 ENCOUNTER — Ambulatory Visit (HOSPITAL_COMMUNITY): Payer: Medicare Other | Attending: Family Medicine | Admitting: Physical Therapy

## 2019-06-21 ENCOUNTER — Other Ambulatory Visit: Payer: Self-pay

## 2019-06-21 DIAGNOSIS — R2689 Other abnormalities of gait and mobility: Secondary | ICD-10-CM | POA: Diagnosis not present

## 2019-06-21 DIAGNOSIS — R296 Repeated falls: Secondary | ICD-10-CM | POA: Diagnosis not present

## 2019-06-21 NOTE — Therapy (Signed)
[place], [place], [address] Phone: [phone]   Fax:  [phone]  Physical Therapy Evaluation  Patient Details  Name: Deborah Gray MRN: [id number] Date of Birth: 10/29/1933 Referring Provider (PT): Baltazar Apo MD    Encounter Date: 06/21/2019  PT End of Session - 06/21/19 1745    Visit Number  1    Number of Visits  12    Date for PT Re-Evaluation  08/06/19    Authorization Type  [employer] Medicare; [employer] secondary, [employer] tertiary; No auth req. no V.L.    Progress Note Due on Visit  10    PT Start Time  1430    PT Stop Time  1520    PT Time Calculation (min)  50 min    Activity Tolerance  Patient tolerated treatment well    Behavior During Therapy  WFL for tasks assessed/performed       Past Medical History:  Diagnosis Date  . Arthritis   . Benign neoplasm of colon   . Constipation   . Diaphragmatic hernia without mention of obstruction or gangrene   . Diverticulosis of colon (without mention of hemorrhage)   . Dysphagia, pharyngoesophageal phase   . Esophageal reflux   . Essential hypertension   . Heart murmur   . Hemorrhoids   . Hyperlipidemia   . Internal hemorrhoids without mention of complication   . Left wrist fracture   . PONV (postoperative nausea and vomiting)   . Stricture and stenosis of esophagus   . Type 2 diabetes mellitus ([place])   . Unspecified gastritis and gastroduodenitis without mention of hemorrhage     Past Surgical History:  Procedure Laterality Date  . ABDOMINAL HYSTERECTOMY    . BACTERIAL OVERGROWTH TEST N/A 09/06/2015   Procedure: BACTERIAL OVERGROWTH TEST;  Surgeon: Daneil Dolin, MD;  [facility];  Service: Endoscopy;  Laterality: N/A;  800  . BREAST LUMPECTOMY Right    benign  . CATARACT EXTRACTION Bilateral 2006   Implants in both, surgeries done 6 weeks apart  . CHOLECYSTECTOMY    . COLONOSCOPY  03/08/02   [name]: diverticulosis,  internal hemorrhoids, adenomatous colon polyp  . COLONOSCOPY  01/23/04   [name]: diverticulosis, internal hemorrhoids  . COLONOSCOPY  09/25/05   [name]: diverticulosis  . COLONOSCOPY  07/05/09   [name]: severe diverticulosis  . COLONOSCOPY  01/21/11   [name]: severe diverticulosis in sigmoid to desc colon, int hemorrhoids, follow up TCS in 5 years  . COLONOSCOPY N/A 04/10/2016   Procedure: COLONOSCOPY;  Surgeon: Daneil Dolin, MD;  [facility]: [facility];  Service: Endoscopy;  Laterality: N/A;  12:30 PM  . DILATION AND CURETTAGE OF UTERUS    . ESOPHAGEAL MANOMETRY  03/21/08   Patterson: findings c/w [place] Esophagus  . ESOPHAGOGASTRODUODENOSCOPY  03/08/02   [name]: esophageal stricture, chronic gerd, s/p dilation.   . ESOPHAGOGASTRODUODENOSCOPY  01/23/04   [name]: esophageal stricture, gastritis, hiatal hernia  . ESOPHAGOGASTRODUODENOSCOPY  09/25/05   Patterson: gastritis, benign bx, no H.pylori  . ESOPHAGOGASTRODUODENOSCOPY  07/05/09   Patterson: gastropathy, benign small bowel bx and gastric bx  . ESOPHAGOGASTRODUODENOSCOPY N/A 05/15/2015   Dr. Gala Romney- diffuse moderate inflammation characterized by congestion (edema), erythema, and linear erosions was found in the entire examined stomach. bx= reactive gastropathy  . FOOT SURGERY Left   . HEMORRHOID BANDING  2017   Dr.[name]  . LAPAROSCOPIC VAGINAL HYSTERECTOMY    . ORIF WRIST FRACTURE Left 06/23/2018  Procedure: OPEN REDUCTION INTERNAL FIXATION (ORIF) LEFT WRIST FRACTURE;  Surgeon: Renette Butters, MD;  Location: [facility];  Service: Orthopedics;  Laterality: Left;  regional arm block  . RECTOCELE REPAIR    . TOTAL KNEE ARTHROPLASTY Right     There were no vitals filed for this visit.   Subjective Assessment - 06/21/19 1447    Subjective  Patient presents to physical therapy with complaint of balance difficulty. Patient describes history of several recent falls. Says she was caring for her husband last  year before he had passed away, which did not allow her to keep up with her daily exercise and routine. Says this caused her to get weak, which led her to fall once in November and once in January. Says she broke her hand and had taken some time to heal. Says her back had been hurting from one of those falls, but is seeing a chiropractor that her son recommends which has been helping her. Says she is able to do more now and recent started using a stationary bike that she has at home, but notes that her legs are very weak now. Says this worries her as she describes herself as "clumsy" and would like to work on strength and balance to avoid further falls.    Pertinent History  RT TKA in 2010; Hx of vertigo    Limitations  Standing;Walking;Lifting;House hold activities    Patient Stated Goals  Be more steady on my feet, I'd like ot be a little straighter (posture)    Currently in Pain?  No/denies         [facility] PT Assessment - 06/21/19 0001      Assessment   Medical Diagnosis  Gait Instability    Referring Provider (PT)  Baltazar Apo MD     Onset Date/Surgical Date  --   November 2020   Next MD Visit  --   Not sure of the date    Prior Therapy  Had for RT TKA 2010      Precautions   Precautions  Fall      Restrictions   Weight Bearing Restrictions  No      Balance Screen   Has the patient fallen in the past 6 months  Yes    How many times?  1    Has the patient had a decrease in activity level because of a fear of falling?   No    Is the patient reluctant to leave their home because of a fear of falling?   No      Home Environment   Living Environment  Private residence    Living Arrangements  Alone    Type of [place]      Prior Function   Level of [place] with basic ADLs      Cognition   Overall Cognitive Status  Within Functional Limits for tasks assessed      Sensation   Light Touch  Appears Intact      Posture/Postural Control   Posture/Postural  Control  Postural limitations    Postural Limitations  Increased thoracic kyphosis;Weight shift right;Forward head;Rounded Shoulders   Noted RT lumbar scoliosis     ROM / Strength   AROM / PROM / Strength  Strength      Strength   Strength Assessment Site  Hip;Knee;Ankle    Right/Left Hip  Left;Right    Right Hip Flexion  4+/5    Right Hip Extension  4/5    Right Hip ABduction  4+/5    Left Hip Flexion  4+/5    Left Hip Extension  4/5    Left Hip ABduction  4+/5    Right/Left Knee  Right;Left    Right Knee Flexion  5/5    Right Knee Extension  5/5    Left Knee Flexion  5/5    Left Knee Extension  5/5    Right/Left Ankle  Right;Left    Right Ankle Dorsiflexion  5/5    Left Ankle Dorsiflexion  5/5      Transfers   Five time sit to stand comments   28 sec with no UEs      Ambulation/Gait   Ambulation/Gait  Yes    Ambulation/Gait Assistance  6: Modified independent (Device/Increase time)    Assistive device  None    Gait Pattern  Decreased step length - right;Decreased step length - left;Decreased stride length;Left flexed knee in stance;Trendelenburg    Ambulation Surface  Level;Indoor      Standardized Balance Assessment   Standardized Balance Assessment  Berg Balance Test      Berg Balance Test   Sit to Stand  Able to stand without using hands and stabilize independently    Standing Unsupported  Able to stand safely 2 minutes    Sitting with Back Unsupported but Feet Supported on Floor or Stool  Able to sit safely and securely 2 minutes    Stand to Sit  Sits safely with minimal use of hands    Transfers  Able to transfer safely, minor use of hands    Standing Unsupported with Eyes Closed  Able to stand 10 seconds with supervision    Standing Unsupported with Feet Together  Able to place feet together independently and stand for 1 minute with supervision    From Standing, Reach Forward with Outstretched Arm  Can reach forward >12 cm safely (5")    From Standing Position,  Pick up Object from [place] to pick up shoe safely and easily    From Standing Position, Turn to Look Behind Over each Shoulder  Looks behind one side only/other side shows less weight shift    Turn 360 Degrees  Able to turn 360 degrees safely but slowly    Standing Unsupported, Alternately Place Feet on Step/Stool  Able to complete 4 steps without aid or supervision    Standing Unsupported, One Foot in Front  Able to take small step independently and hold 30 seconds    Standing on One Leg  Tries to lift leg/unable to hold 3 seconds but remains standing independently    Total Score  43                Objective measurements completed on examination: See above findings.              PT Education - 06/21/19 1449    Education Details  On evaluation findings and POC. Discussed BERG results and implications with patient, recommend using an AD, especially on eneven surface and outdoors to reduce risk of falls.    Person(s) Educated  Patient    Methods  Explanation    Comprehension  Verbalized understanding       PT Short Term Goals - 06/21/19 1751      PT SHORT TERM GOAL #1   Title  Patient will be independent with initial HEP and self-management strategies to improve functional outcomes    Time  3  Period  Weeks    Status  New    Target Date  07/16/19        PT Long Term Goals - 06/21/19 1751      PT LONG TERM GOAL #1   Title  Patient will be able to perform stand x 5 in < 15 seconds to demonstrate improvement in functional mobility and reduced risk for falls.    Time  6    Period  Weeks    Status  New    Target Date  08/06/19      PT LONG TERM GOAL #2   Title  Patient will report at least 75% overall improvement in subjective complaint to indicate improvement in ability to perform ADLs.    Time  6    Period  Weeks    Status  New    Target Date  08/06/19      PT LONG TERM GOAL #3   Title  Patient will increase BERG score by at least 5 points to  demonstrate improvement in functional mobility and reduced risk for falls.    Time  6    Period  Weeks    Status  New    Target Date  08/06/19      PT LONG TERM GOAL #4   Title  Pateitn will report no falls since starting therapy to demonstrate improvement in functional mobility and reduced risk for falls    Time  6    Period  Weeks    Status  New    Target Date  08/06/19             Plan - 06/21/19 1747    Clinical Impression Statement  Patient is a 86 y.o. Female who presents to physical therapy with complaint of balance difficulty and LE weakness. Patient demonstrates decreased strength, balance deficits and gait abnormalities which are negatively impacting patient ability to perform ADLs and functional mobility tasks. Patient will benefit from skilled physical therapy services to address these deficits to improve level of function with ADLs, functional mobility tasks, and reduce risk for falls.    Personal Factors and Comorbidities  Age;Past/Current Experience    Examination-Activity Limitations  Bathing;Stand;Lift;Stairs;Dressing;Squat;Carry;Bend;Transfers;Locomotion Level    Examination-Participation Restrictions  Yard Work;Church;Cleaning;Community Activity;Laundry;Volunteer    Stability/Clinical Decision Making  Stable/Uncomplicated    Clinical Decision Making  Low    Rehab Potential  Good    PT Frequency  2x / week    PT Duration  6 weeks    PT Treatment/Interventions  ADLs/Self Care Home Management;Aquatic Therapy;Biofeedback;Cryotherapy;Electrical Stimulation;Contrast Bath;Therapeutic exercise;Orthotic Fit/Training;Compression bandaging;Manual lymph drainage;Patient/family education;Therapeutic activities;Functional mobility training;Cognitive remediation;Manual techniques;Wheelchair mobility training;Passive range of motion;Dry needling;Balance training;Neuromuscular re-education;Gait training;DME Instruction;Iontophoresis 4mg /ml Dexamethasone;Stair training;Moist  Heat;Traction;Ultrasound;Parrafin;Fluidtherapy;Taping;Vasopneumatic Device;Spinal Manipulations;Joint Manipulations;Energy conservation;Splinting;Visual/perceptual remediation/compensation    PT Next Visit Plan  Review goals, issue HEP. HEP to consist of sit to stand, heel raise at sink, and staggered standing at sink. Progress as tolerated with focus on balance, posture and funcitonal strengthening    PT Home Exercise Plan  Issue next visit    Consulted and Agree with Plan of Care  Patient       Patient will benefit from skilled therapeutic intervention in order to improve the following deficits and impairments:  Abnormal gait, Postural dysfunction, Decreased activity tolerance, Decreased endurance, Decreased strength, Decreased balance, Difficulty walking, Hypomobility  Visit Diagnosis: Repeated falls  Other abnormalities of gait and mobility     Problem List Patient Active Problem List   Diagnosis Date Noted  .  SVT (supraventricular tachycardia) ([place]) 12/23/2018  . Bloating 08/30/2015  . Exertional chest pain 08/16/2015  . Chest pain 08/16/2015  . Mucosal abnormality of stomach   . Hemorrhoids 04/18/2015  . IBS (irritable bowel syndrome) 01/30/2015  . Type 2 diabetes mellitus ([place]) 12/22/2014  . Hyperlipidemia 12/22/2014  . Prolapse of female pelvic organs 11/28/2013  . Precordial pain 11/25/2013  . Essential hypertension 11/25/2013  . Heart murmur 11/25/2013  . Osteopenia 10/23/2012  . Chronic pancreatitis ([place]) 02/26/2011  . GERD (gastroesophageal reflux disease) 01/18/2011  . Constipation 11/29/2010  . Esophageal spasm 11/29/2010  . DJD (degenerative joint disease) 11/29/2010  . Hx of adenomatous colonic polyps 06/29/2009  . ESOPHAGEAL MOTILITY DISORDER 04/01/2008  . DM 03/01/2008  . Prolapsed hemorrhoids 07/02/2007  . Diverticulosis of large intestine 07/02/2007    6:01 PM, 06/21/19 Josue Hector PT DPT  Physical Therapist with [facility]  [phone] [place] [address] [place], [place], [address] Phone: [phone]   Fax:  [phone]  Name: Deborah Gray MRN: [id number] Date of Birth: 03-Oct-1933

## 2019-06-22 ENCOUNTER — Encounter: Payer: Self-pay | Admitting: Internal Medicine

## 2019-06-22 ENCOUNTER — Ambulatory Visit (INDEPENDENT_AMBULATORY_CARE_PROVIDER_SITE_OTHER): Payer: Medicare Other | Admitting: Internal Medicine

## 2019-06-22 VITALS — BP 124/63 | HR 94 | Temp 97.1°F | Ht 64.0 in | Wt 140.6 lb

## 2019-06-22 DIAGNOSIS — K219 Gastro-esophageal reflux disease without esophagitis: Secondary | ICD-10-CM | POA: Diagnosis not present

## 2019-06-22 DIAGNOSIS — K8689 Other specified diseases of pancreas: Secondary | ICD-10-CM

## 2019-06-22 DIAGNOSIS — K648 Other hemorrhoids: Secondary | ICD-10-CM | POA: Diagnosis not present

## 2019-06-22 NOTE — Patient Instructions (Addendum)
Avoid straining.  Limit toilet time to 5 minutes  Levsin sublingually as needed for occasional diarrhea  Commend continue omeprazole 20 mg daily as well as pancreatic enzyme supplements  Following precautions reviewed-chew food slowly, have adequate liquids on hand to assist with swallowing.  Eat slowly-take 30 minutes eat a meal  Call with any interim problems  Schedule followup appointment in 6 weeks from now (AB)

## 2019-06-22 NOTE — Progress Notes (Signed)
Primary Care Physician:  [name], MD Primary Gastroenterologist:  Dr. Gala Romney  Pre-Procedure History & Physical: HPI:  Deborah Gray is a 86 y.o. female here for further evaluation of protruding hemorrhoids from time to time.  GERD well-controlled on omeprazole 20 mg daily although she has become fearful in taking it due to potential side effects including dementia.  Diarrhea only on occasion takes Levsin when it occurs.  Continues on pancreatic enzyme supplements.  Desires hemorrhoid banding.  Past Medical History:  Diagnosis Date  . Arthritis   . Benign neoplasm of colon   . Constipation   . Diaphragmatic hernia without mention of obstruction or gangrene   . Diverticulosis of colon (without mention of hemorrhage)   . Dysphagia, pharyngoesophageal phase   . Esophageal reflux   . Essential hypertension   . Heart murmur   . Hemorrhoids   . Hyperlipidemia   . Internal hemorrhoids without mention of complication   . Left wrist fracture   . PONV (postoperative nausea and vomiting)   . Stricture and stenosis of esophagus   . Type 2 diabetes mellitus ([place])   . Unspecified gastritis and gastroduodenitis without mention of hemorrhage     Past Surgical History:  Procedure Laterality Date  . ABDOMINAL HYSTERECTOMY    . BACTERIAL OVERGROWTH TEST N/A 09/06/2015   Procedure: BACTERIAL OVERGROWTH TEST;  Surgeon: Daneil Dolin, MD;  [facility];  Service: Endoscopy;  Laterality: N/A;  800  . BREAST LUMPECTOMY Right    benign  . CATARACT EXTRACTION Bilateral 2006   Implants in both, surgeries done 6 weeks apart  . CHOLECYSTECTOMY    . COLONOSCOPY  03/08/02   [name]: diverticulosis, internal hemorrhoids, adenomatous colon polyp  . COLONOSCOPY  01/23/04   [name]: diverticulosis, internal hemorrhoids  . COLONOSCOPY  09/25/05   [name]: diverticulosis  . COLONOSCOPY  07/05/09   [name]: severe diverticulosis  . COLONOSCOPY  01/21/11   [name]: severe  diverticulosis in sigmoid to desc colon, int hemorrhoids, follow up TCS in 5 years  . COLONOSCOPY N/A 04/10/2016   Procedure: COLONOSCOPY;  Surgeon: Daneil Dolin, MD;  [facility];  Service: Endoscopy;  Laterality: N/A;  12:30 PM  . DILATION AND CURETTAGE OF UTERUS    . ESOPHAGEAL MANOMETRY  03/21/08   [facility]terson: findings c/w Nutcracker Esophagus  . ESOPHAGOGASTRODUODENOSCOPY  03/08/02   [name]: esophageal stricture, chronic gerd, s/p dilation.   . ESOPHAGOGASTRODUODENOSCOPY  01/23/04   [name]: esophageal stricture, gastritis, hiatal hernia  . ESOPHAGOGASTRODUODENOSCOPY  09/25/05   [facility]: gastritis, benign bx, no H.pylori  . ESOPHAGOGASTRODUODENOSCOPY  07/05/09   [facility]: gastropathy, benign small bowel bx and gastric bx  . ESOPHAGOGASTRODUODENOSCOPY N/A 05/15/2015   Dr. Gala Romney- diffuse moderate inflammation characterized by congestion (edema), erythema, and linear erosions was found in the entire examined stomach. bx= reactive gastropathy  . FOOT SURGERY Left   . HEMORRHOID BANDING  2017   Dr.Shashwat Cleary  . LAPAROSCOPIC VAGINAL HYSTERECTOMY    . ORIF WRIST FRACTURE Left 06/23/2018   Procedure: OPEN REDUCTION INTERNAL FIXATION (ORIF) LEFT WRIST FRACTURE;  Surgeon: Renette Butters, MD;  Location: [facility];  Service: Orthopedics;  Laterality: Left;  regional arm block  . RECTOCELE REPAIR    . TOTAL KNEE ARTHROPLASTY Right     Prior to Admission medications   Medication Sig Start Date End Date Taking? Authorizing Provider  acetaminophen (TYLENOL) 500 MG tablet Take 500 mg at bedtime by mouth.    Yes  [provider]  aspirin EC 81 MG tablet Take 81 mg by mouth at bedtime.   Yes [provider]  azelastine (ASTELIN) 0.1 % nasal spray Place 1 spray into both nostrils 2 (two) times daily as needed for rhinitis. 01/08/18  Yes [name], MD  blood glucose meter kit and supplies KIT Dispense based on  insurance preference. Tests once a day  E11.9 09/25/18  Yes [name], MD  calcium carbonate (OS-CAL) 600 MG TABS tablet Take 1 tablet by mouth in the morning and at bedtime.   Yes [provider]  Carboxymethylcellulose Sodium (THERATEARS) 0.25 % SOLN Apply 1 drop to eye 2 (two) times daily.    Yes [provider]  clidinium-chlordiazePOXIDE (LIBRAX) 5-2.5 MG capsule Take 1 capsule by mouth 4 (four) times daily as needed. 07/16/18  Yes [provider]  ezetimibe (ZETIA) 10 MG tablet TAKE 1 TABLET BY MOUTH EVERY DAY 12/28/18  Yes Imogene Burn, PA-C  guaiFENesin (MUCINEX) 600 MG 12 hr tablet Take 600 mg by mouth daily.    Yes [provider]  hyoscyamine (LEVSIN SL) 0.125 MG SL tablet Place 1 tablet (0.125 mg total) under the tongue every 4 (four) hours as needed. Use one under the tongue before meals and at bedtime as needed for flares in symptoms. 04/06/19  Yes Sondra Blixt, Cristopher Estimable, MD  imipramine (TOFRANIL) 10 MG tablet TAKE 1 TABLET BY MOUTH EVERYDAY AT BEDTIME 04/07/19  Yes Mahala Menghini, PA-C  losartan (COZAAR) 50 MG tablet Take 1 tablet (50 mg total) by mouth every evening. 12/28/18  Yes Imogene Burn, PA-C  meclizine (ANTIVERT) 25 MG tablet TAKE 1 TABLET (25 MG TOTAL) BY MOUTH 3 (THREE) TIMES DAILY AS NEEDED FOR DIZZINESS OR NAUSEA. 01/06/19  Yes [name], MD  metFORMIN (GLUCOPHAGE) 500 MG tablet TAKE ONE TABLET BY MOUTH EVERY MORNING ONE AT LUNCH AND 2 AT BEDTIME Patient taking differently: Take 500 mg by mouth at bedtime. TAKE ONE TABLET BY MOUTH EVERY MORNING ONE AT LUNCH AND 2 AT BEDTIME 06/02/19  Yes [name], MD  methenamine (HIPREX) 1 g tablet Take 1 tablet (1 g total) by mouth 2 (two) times daily. 04/20/19  Yes McKenzie, Candee Furbish, MD  mirabegron ER (MYRBETRIQ) 25 MG TB24 tablet Take 50 mg every morning by mouth.    Yes [provider]  Multiple Vitamins-Minerals (PRESERVISION AREDS 2) CAPS Take 1 capsule by mouth 2 (two) times daily.    Yes [provider]  nateglinide (STARLIX) 120 MG tablet TAKE 1 TABLET BY MOUTH 3 TIMES A DAY BEFORE MEALS 06/02/19  Yes [name], MD  NON FORMULARY [place] probiotic  Once a day   Yes [provider]  omega-3 acid ethyl esters (LOVAZA) 1 g capsule Take 3 g by mouth daily.    Yes [provider]  OVER THE COUNTER MEDICATION Vitamin D     One a day  Not sure strength   Yes [provider]  Pancrelipase, Lip-Prot-Amyl, (CREON) 24000-76000 units CPEP TAKE 3 CAPSULES THREE TIMES DAILY WITH MEALS AND ONE CAPSULE WITH SNACK TWICE DAILY 07/30/18  Yes Annitta Needs, NP  Vitamin Mixture (ESTER-C) 500-60 MG TABS Take 1 tablet by mouth daily.    Yes [provider]  Wheat Dextrin (BENEFIBER) POWD Take by mouth as needed. Takes 1 tablespoon once daily as needed   Yes [provider]    Allergies as of 06/22/2019 - Review Complete 06/22/2019  Allergen Reaction Noted  . Adhesive [tape] Other (See Comments) 07/21/2016  . Estrogens Other (See Comments) 10/15/2012  . Sulfonamide derivatives Rash 03/01/2008    Family History  Problem Relation Age of Onset  . Ovarian cancer Mother   . Diabetes Father   . Heart disease Father   . Breast cancer Sister   . Liver cancer Sister   . Colon cancer Cousin        Paternal side  . Diabetes Maternal Aunt   . Liver disease Cousin        Maternal side, never drank    Social History   Socioeconomic History  . Marital status: Married    Spouse name: Not on file  . Number of children: 3  . Years of education: Not on file  . Highest education level: Not on file  Occupational History  . Occupation: Retired  Tobacco Use  . Smoking status: Never Smoker  . Smokeless tobacco: Never Used  . Tobacco comment: Never smoked  Substance and Sexual Activity  . Alcohol use: No    Alcohol/week: 0.0 standard drinks  . Drug use: No  . Sexual activity: Not on file  Other Topics Concern  . Not on file  Social History Narrative  . Not  on file   Social Determinants of Health   Financial Resource Strain:   . Difficulty of Paying Living Expenses:   Food Insecurity:   . Worried About Charity fundraiser in the Last Year:   . Arboriculturist in the Last Year:   Transportation Needs:   . Film/video editor (Medical):   Marland Kitchen Lack of Transportation (Non-Medical):   Physical Activity:   . Days of Exercise per Week:   . Minutes of Exercise per Session:   Stress:   . Feeling of Stress :   Social Connections:   . Frequency of Communication with Friends and Family:   . Frequency of Social Gatherings with Friends and Family:   . Attends Religious Services:   . Active Member of Clubs or Organizations:   . Attends Archivist Meetings:   Marland Kitchen Marital Status:   Intimate Partner Violence:   . Fear of Current or Ex-Partner:   . Emotionally Abused:   Marland Kitchen Physically Abused:   . Sexually Abused:     Review of Systems: See HPI, otherwise negative ROS  Physical Exam: BP 124/63   Pulse 94   Temp (!) 97.1 F (36.2 C) (Temporal)   Ht '5\' 4"'$  (1.626 m)   Wt 140 lb 9.6 oz (63.8 kg)   BMI 24.13 kg/m  General:   Alert, pleasant and cooperative in NAD.  Seems a little more forgetful than during encounters previously Neck:  Supple; no masses or thyromegaly. No significant cervical adenopathy. Lungs:  Clear throughout to auscultation.   No wheezes, crackles, or rhonchi. No acute distress. Heart:  Regular rate and rhythm; no murmurs, clicks, rubs,  or gallops. Abdomen: Non-distended, normal bowel sounds.  Soft and nontender without appreciable mass or hepatosplenomegaly.  Pulses:  Normal pulses noted. Extremities:  Without clubbing or edema. Rectal: No external hemorrhoids.  Minimal stool in rectal vault.  No mass.   Impression/Plan: 86 year old lady with history of GERD pancreatic exocrine insufficiency.   GERD well-controlled.  Still has issues with protruding hemorrhoids from time to time.  Patient desirous of hemorrhoid  banding today.  This is not an unreasonable approach.  [place] banding procedure note:  The patient presents with symptomatic grade  2 hemorrhoids requesting rubber band ligation of her hemorrhoidal disease.  Desires hemorrhoid banding today.  All risks, benefits, and alternative forms of therapy were described and informed consent was obtained.  In the left lateral decubitus position, anoscopy performed.  Notable hemorrhoid tag right mid and left lateral side.  Topical Xylocaine used for local anesthesia.  Utilizing the [place] banding unit a band was applied to the right hemorrhoid column.  Follow-up DRE revealed the band to be in excellent position no pinching or pain.  Subsequently I elected to place a second band on the left mid column.  This was done in a similar fashion.  DRE revealed the band be in excellent position.  No pitching or pain following the procedure.  Dietary and behavioral recommendations were given and (if necessary prescriptions were given), along with follow-up instructions.   No complications were encountered and the patient tolerated the procedure well.   Recommendations:  Avoid straining.  Limit toilet time to 5 minutes  Levsin sublingually as needed for occasional diarrhea  Commend continue omeprazole 20 mg daily as well as pancreatic enzyme supplements  Following precautions reviewed-chew food slowly, have adequate liquids on hand to assist with swallowing.  Eat slowly-take 30 minutes eat a meal  Call with any interim problems  Schedule followup appointment in 6 weeks from now (AB)      Notice: This dictation was prepared with Dragon dictation along with smaller phrase technology. Any transcriptional errors that result from this process are unintentional and may not be corrected upon review.

## 2019-06-23 ENCOUNTER — Encounter: Payer: Self-pay | Admitting: Internal Medicine

## 2019-06-24 ENCOUNTER — Ambulatory Visit (HOSPITAL_COMMUNITY): Admission: RE | Admit: 2019-06-24 | Payer: Medicare Other | Source: Ambulatory Visit

## 2019-06-24 ENCOUNTER — Encounter (HOSPITAL_COMMUNITY): Payer: Medicare Other

## 2019-06-25 ENCOUNTER — Encounter (HOSPITAL_COMMUNITY): Payer: Medicare Other

## 2019-06-25 ENCOUNTER — Ambulatory Visit (HOSPITAL_COMMUNITY): Payer: Medicare Other

## 2019-06-28 DIAGNOSIS — S233XXA Sprain of ligaments of thoracic spine, initial encounter: Secondary | ICD-10-CM | POA: Diagnosis not present

## 2019-06-28 DIAGNOSIS — S134XXA Sprain of ligaments of cervical spine, initial encounter: Secondary | ICD-10-CM | POA: Diagnosis not present

## 2019-06-28 DIAGNOSIS — S338XXA Sprain of other parts of lumbar spine and pelvis, initial encounter: Secondary | ICD-10-CM | POA: Diagnosis not present

## 2019-06-29 ENCOUNTER — Ambulatory Visit (HOSPITAL_COMMUNITY): Payer: Medicare Other

## 2019-06-29 ENCOUNTER — Encounter (HOSPITAL_COMMUNITY): Payer: Self-pay

## 2019-06-29 ENCOUNTER — Other Ambulatory Visit: Payer: Self-pay

## 2019-06-29 DIAGNOSIS — R296 Repeated falls: Secondary | ICD-10-CM

## 2019-06-29 DIAGNOSIS — R2689 Other abnormalities of gait and mobility: Secondary | ICD-10-CM

## 2019-06-29 NOTE — Patient Instructions (Signed)
Functional Quadriceps: Sit to Stand    Sit on edge of chair, feet flat on floor. Stand upright, extending knees fully. Repeat 5 times per set. Do 2 sets per day.  http://orth.exer.us/735   Copyright  VHI. All rights reserved.   Heel Raises    Stand with support.  With knees straight, raise heels off ground. Hold 3 seconds.  Repeat 10 times. Do 1-2 times a day.  Copyright  VHI. All rights reserved.   Tandem Stance    Standing infront of counter/sink for safety. Right foot in front of left.   Balance in this position 30 seconds. Do with left foot in front of right. Repeat 3 times a day each position.    Copyright  VHI. All rights reserved.

## 2019-06-29 NOTE — Therapy (Signed)
[facility] [place], [place], [address] Phone: [phone]   Fax:  [phone]  Physical Therapy Treatment  Patient Details  Name: Deborah Gray MRN: [id number] Date of Birth: Dec 21, 1933 Referring Provider (PT): Baltazar Apo MD    Encounter Date: 06/29/2019  PT End of Session - 06/29/19 1709    Visit Number  2    Number of Visits  12    Date for PT Re-Evaluation  08/06/19    Authorization Type  BCBC Medicare; [place] state [employer] secondary, [employer] tertiary; No auth req. no V.L.    Progress Note Due on Visit  10    PT Start Time  1703    PT Stop Time  1743    PT Time Calculation (min)  40 min    Activity Tolerance  Patient tolerated treatment well    Behavior During Therapy  WFL for tasks assessed/performed       Past Medical History:  Diagnosis Date  . Arthritis   . Benign neoplasm of colon   . Constipation   . Diaphragmatic hernia without mention of obstruction or gangrene   . Diverticulosis of colon (without mention of hemorrhage)   . Dysphagia, pharyngoesophageal phase   . Esophageal reflux   . Essential hypertension   . Heart murmur   . Hemorrhoids   . Hyperlipidemia   . Internal hemorrhoids without mention of complication   . Left wrist fracture   . PONV (postoperative nausea and vomiting)   . Stricture and stenosis of esophagus   . Type 2 diabetes mellitus ([place])   . Unspecified gastritis and gastroduodenitis without mention of hemorrhage     Past Surgical History:  Procedure Laterality Date  . ABDOMINAL HYSTERECTOMY    . BACTERIAL OVERGROWTH TEST N/A 09/06/2015   Procedure: BACTERIAL OVERGROWTH TEST;  Surgeon: Daneil Dolin, MD;  [facility];  Service: Endoscopy;  Laterality: N/A;  800  . BREAST LUMPECTOMY Right    benign  . CATARACT EXTRACTION Bilateral 2006   Implants in both, surgeries done 6 weeks apart  . CHOLECYSTECTOMY    . COLONOSCOPY  03/08/02   [name]: diverticulosis,  internal hemorrhoids, adenomatous colon polyp  . COLONOSCOPY  01/23/04   [name]: diverticulosis, internal hemorrhoids  . COLONOSCOPY  09/25/05   [name]: diverticulosis  . COLONOSCOPY  07/05/09   [name]: severe diverticulosis  . COLONOSCOPY  01/21/11   [name]: severe diverticulosis in sigmoid to desc colon, int hemorrhoids, follow up TCS in 5 years  . COLONOSCOPY N/A 04/10/2016   Procedure: COLONOSCOPY;  Surgeon: Daneil Dolin, MD;  [facility];  Service: Endoscopy;  Laterality: N/A;  12:30 PM  . DILATION AND CURETTAGE OF UTERUS    . ESOPHAGEAL MANOMETRY  03/21/08   Patterson: findings c/w Nutcracker Esophagus  . ESOPHAGOGASTRODUODENOSCOPY  03/08/02   [name]: esophageal stricture, chronic gerd, s/p dilation.   . ESOPHAGOGASTRODUODENOSCOPY  01/23/04   [name]: esophageal stricture, gastritis, hiatal hernia  . ESOPHAGOGASTRODUODENOSCOPY  09/25/05   Patterson: gastritis, benign bx, no H.pylori  . ESOPHAGOGASTRODUODENOSCOPY  07/05/09   Patterson: gastropathy, benign small bowel bx and gastric bx  . ESOPHAGOGASTRODUODENOSCOPY N/A 05/15/2015   Dr. Gala Romney- diffuse moderate inflammation characterized by congestion (edema), erythema, and linear erosions was found in the entire examined stomach. bx= reactive gastropathy  . FOOT SURGERY Left   . HEMORRHOID BANDING  2017   Dr.[name]  . LAPAROSCOPIC VAGINAL HYSTERECTOMY    . ORIF WRIST FRACTURE Left 06/23/2018  Procedure: OPEN REDUCTION INTERNAL FIXATION (ORIF) LEFT WRIST FRACTURE;  Surgeon: Renette Butters, MD;  Location: [facility];  Service: Orthopedics;  Laterality: Left;  regional arm block  . RECTOCELE REPAIR    . TOTAL KNEE ARTHROPLASTY Right     There were no vitals filed for this visit.  Subjective Assessment - 06/29/19 1707    Subjective  Pt reports she had a knot on her Lt hip this moning, has taken medication and applied ice that seemed to help.  No reports of pain or recent fall    Pertinent History   RT TKA in 2010; Hx of vertigo    Patient Stated Goals  Be more steady on my feet, I'd like ot be a little straighter (posture)    Currently in Pain?  No/denies                       [facility] Adult PT Treatment/Exercise - 06/29/19 0001      Posture/Postural Control   Posture/Postural Control  Postural limitations    Postural Limitations  Increased thoracic kyphosis;Weight shift right;Forward head;Rounded Shoulders   Lumbar lordosis     Exercises   Exercises  Knee/Hip      Knee/Hip Exercises: Standing   Heel Raises  10 reps    Hip Abduction  Both;10 reps;Knee straight    Hip Extension  10 reps;Knee straight;Both    Other Standing Knee Exercises  staggered tandem stance 2x 30" by counter      Knee/Hip Exercises: Seated   Sit to Sand  2 sets;5 reps;without UE support   eccentric control            PT Education - 06/29/19 1759    Education Details  Reviewed goals, educated importance of HEP compliance that was established today.    Person(s) Educated  Patient    Methods  Explanation;Demonstration;Verbal cues;Handout    Comprehension  Verbalized understanding;Returned demonstration       PT Short Term Goals - 06/21/19 1751      PT SHORT TERM GOAL #1   Title  Patient will be independent with initial HEP and self-management strategies to improve functional outcomes    Time  3    Period  Weeks    Status  New    Target Date  07/16/19        PT Long Term Goals - 06/21/19 1751      PT LONG TERM GOAL #1   Title  Patient will be able to perform stand x 5 in < 15 seconds to demonstrate improvement in functional mobility and reduced risk for falls.    Time  6    Period  Weeks    Status  New    Target Date  08/06/19      PT LONG TERM GOAL #2   Title  Patient will report at least 75% overall improvement in subjective complaint to indicate improvement in ability to perform ADLs.    Time  6    Period  Weeks    Status  New    Target Date  08/06/19      PT  LONG TERM GOAL #3   Title  Patient will increase BERG score by at least 5 points to demonstrate improvement in functional mobility and reduced risk for falls.    Time  6    Period  Weeks    Status  New    Target Date  08/06/19  PT LONG TERM GOAL #4   Title  Pateitn will report no falls since starting therapy to demonstrate improvement in functional mobility and reduced risk for falls    Time  6    Period  Weeks    Status  New    Target Date  08/06/19            Plan - 06/29/19 1756    Clinical Impression Statement  Reviewed goals, educated importance of HEP complaince for maximal benefits from therapy.  Session focus wiht general LE strengthening and static balance training.  Established HEP, pt able to demonstrate safely and verbalized understanding with all exercises.    Personal Factors and Comorbidities  Age;Past/Current Experience    Examination-Participation Restrictions  Yard Work;Church;Cleaning;Community Activity;Laundry;Volunteer    Stability/Clinical Decision Making  Stable/Uncomplicated    Clinical Decision Making  Low    Rehab Potential  Good    PT Frequency  2x / week    PT Duration  6 weeks    PT Treatment/Interventions  ADLs/Self Care Home Management;Aquatic Therapy;Biofeedback;Cryotherapy;Electrical Stimulation;Contrast Bath;Therapeutic exercise;Orthotic Fit/Training;Compression bandaging;Manual lymph drainage;Patient/family education;Therapeutic activities;Functional mobility training;Cognitive remediation;Manual techniques;Wheelchair mobility training;Passive range of motion;Dry needling;Balance training;Neuromuscular re-education;Gait training;DME Instruction;Iontophoresis 4mg /ml Dexamethasone;Stair training;Moist Heat;Traction;Ultrasound;Parrafin;Fluidtherapy;Taping;Vasopneumatic Device;Spinal Manipulations;Joint Manipulations;Energy conservation;Splinting;Visual/perceptual remediation/compensation    PT Next Visit Plan  Progress as tolerated with focus on  balance, posture and funcitonal strengthening    PT Home Exercise Plan  06/29/19: sit to stand, heel raise at sink, and staggered standing at sink       Patient will benefit from skilled therapeutic intervention in order to improve the following deficits and impairments:  Abnormal gait, Postural dysfunction, Decreased activity tolerance, Decreased endurance, Decreased strength, Decreased balance, Difficulty walking, Hypomobility  Visit Diagnosis: Repeated falls  Other abnormalities of gait and mobility     Problem List Patient Active Problem List   Diagnosis Date Noted  . SVT (supraventricular tachycardia) ([place]) 12/23/2018  . Bloating 08/30/2015  . Exertional chest pain 08/16/2015  . Chest pain 08/16/2015  . Mucosal abnormality of stomach   . Hemorrhoids 04/18/2015  . IBS (irritable bowel syndrome) 01/30/2015  . Type 2 diabetes mellitus ([place]) 12/22/2014  . Hyperlipidemia 12/22/2014  . Prolapse of female pelvic organs 11/28/2013  . Precordial pain 11/25/2013  . Essential hypertension 11/25/2013  . Heart murmur 11/25/2013  . Osteopenia 10/23/2012  . Chronic pancreatitis ([place]) 02/26/2011  . GERD (gastroesophageal reflux disease) 01/18/2011  . Constipation 11/29/2010  . Esophageal spasm 11/29/2010  . DJD (degenerative joint disease) 11/29/2010  . Hx of adenomatous colonic polyps 06/29/2009  . ESOPHAGEAL MOTILITY DISORDER 04/01/2008  . DM 03/01/2008  . Prolapsed hemorrhoids 07/02/2007  . Diverticulosis of large intestine 07/02/2007   Ihor Austin, LPTA/CLT; CBIS [phone]  Aldona Lento 06/29/2019, 6:00 PM  [facility] [address] [place], [place], [address] Phone: [phone]   Fax:  [phone]  Name: Deborah Gray MRN: [id number] Date of Birth: Feb 21, 1933

## 2019-07-01 ENCOUNTER — Ambulatory Visit (HOSPITAL_COMMUNITY): Payer: Medicare Other | Admitting: Physical Therapy

## 2019-07-01 ENCOUNTER — Other Ambulatory Visit: Payer: Self-pay

## 2019-07-01 ENCOUNTER — Encounter (HOSPITAL_COMMUNITY): Payer: Self-pay | Admitting: Physical Therapy

## 2019-07-01 DIAGNOSIS — R296 Repeated falls: Secondary | ICD-10-CM | POA: Diagnosis not present

## 2019-07-01 DIAGNOSIS — R2689 Other abnormalities of gait and mobility: Secondary | ICD-10-CM

## 2019-07-01 NOTE — Therapy (Signed)
[place], [place], [address] Phone: [phone]   Fax:  [phone]  Physical Therapy Treatment  Patient Details  Name: Deborah Gray MRN: [id number] Date of Birth: 01/03/1934 Referring Provider (PT): Baltazar Apo MD    Encounter Date: 07/01/2019  PT End of Session - 07/01/19 1528    Visit Number  3    Number of Visits  12    Date for PT Re-Evaluation  08/06/19    Authorization Type  BCBC Medicare; [place] state health secondary, [employer] tertiary; No auth req. no V.L.    Progress Note Due on Visit  10    PT Start Time  1520    PT Stop Time  1600    PT Time Calculation (min)  40 min    Activity Tolerance  Patient tolerated treatment well    Behavior During Therapy  WFL for tasks assessed/performed       Past Medical History:  Diagnosis Date  . Arthritis   . Benign neoplasm of colon   . Constipation   . Diaphragmatic hernia without mention of obstruction or gangrene   . Diverticulosis of colon (without mention of hemorrhage)   . Dysphagia, pharyngoesophageal phase   . Esophageal reflux   . Essential hypertension   . Heart murmur   . Hemorrhoids   . Hyperlipidemia   . Internal hemorrhoids without mention of complication   . Left wrist fracture   . PONV (postoperative nausea and vomiting)   . Stricture and stenosis of esophagus   . Type 2 diabetes mellitus ([place])   . Unspecified gastritis and gastroduodenitis without mention of hemorrhage     Past Surgical History:  Procedure Laterality Date  . ABDOMINAL HYSTERECTOMY    . BACTERIAL OVERGROWTH TEST N/A 09/06/2015   Procedure: BACTERIAL OVERGROWTH TEST;  Surgeon: Daneil Dolin, MD;  [facility];  Service: Endoscopy;  Laterality: N/A;  800  . BREAST LUMPECTOMY Right    benign  . CATARACT EXTRACTION Bilateral 2006   Implants in both, surgeries done 6 weeks apart  . CHOLECYSTECTOMY    . COLONOSCOPY  03/08/02   [name]: diverticulosis,  internal hemorrhoids, adenomatous colon polyp  . COLONOSCOPY  01/23/04   [name]: diverticulosis, internal hemorrhoids  . COLONOSCOPY  09/25/05   [name]: diverticulosis  . COLONOSCOPY  07/05/09   [name]: severe diverticulosis  . COLONOSCOPY  01/21/11   [name]: severe diverticulosis in sigmoid to desc colon, int hemorrhoids, follow up TCS in 5 years  . COLONOSCOPY N/A 04/10/2016   Procedure: COLONOSCOPY;  Surgeon: Daneil Dolin, MD;  [facility];  Service: Endoscopy;  Laterality: N/A;  12:30 PM  . DILATION AND CURETTAGE OF UTERUS    . ESOPHAGEAL MANOMETRY  03/21/08   Patterson: findings c/w Nutcracker Esophagus  . ESOPHAGOGASTRODUODENOSCOPY  03/08/02   [name]: esophageal stricture, chronic gerd, s/p dilation.   . ESOPHAGOGASTRODUODENOSCOPY  01/23/04   [name]: esophageal stricture, gastritis, hiatal hernia  . ESOPHAGOGASTRODUODENOSCOPY  09/25/05   Patterson: gastritis, benign bx, no H.pylori  . ESOPHAGOGASTRODUODENOSCOPY  07/05/09   [facility]terson: gastropathy, benign small bowel bx and gastric bx  . ESOPHAGOGASTRODUODENOSCOPY N/A 05/15/2015   Dr. Gala Romney- diffuse moderate inflammation characterized by congestion (edema), erythema, and linear erosions was found in the entire examined stomach. bx= reactive gastropathy  . FOOT SURGERY Left   . HEMORRHOID BANDING  2017   Dr.[name]  . LAPAROSCOPIC VAGINAL HYSTERECTOMY    . ORIF WRIST FRACTURE Left 06/23/2018  Procedure: OPEN REDUCTION INTERNAL FIXATION (ORIF) LEFT WRIST FRACTURE;  Surgeon: Renette Butters, MD;  Location: [facility];  Service: Orthopedics;  Laterality: Left;  regional arm block  . RECTOCELE REPAIR    . TOTAL KNEE ARTHROPLASTY Right     There were no vitals filed for this visit.  Subjective Assessment - 07/01/19 1520    Subjective  Patient states getting up exercises were easy for her the other day. Yesterday she spent a while on the phone and then she had trouble with her sit to stands because of  her back pain. She was taking Advil and Tylenol this morning due to her back pain.    Pertinent History  RT TKA in 2010; Hx of vertigo    Patient Stated Goals  Be more steady on my feet, I'd like ot be a little straighter (posture)    Currently in Pain?  No/denies                        [facility] Adult PT Treatment/Exercise - 07/01/19 0001      Posture/Postural Control   Posture/Postural Control  Postural limitations    Postural Limitations  Increased thoracic kyphosis;Weight shift right;Forward head;Rounded Shoulders      Knee/Hip Exercises: Standing   Heel Raises  10 reps    Hip Abduction  Both;10 reps;Knee straight;2 sets    Hip Extension  10 reps;Knee straight;Both    Lateral Step Up  Both;10 reps;Hand Hold: 1;Step Height: 4";2 sets    Forward Step Up  Both;1 set;10 reps      Knee/Hip Exercises: Seated   Sit to Sand  3 sets;5 reps             PT Education - 07/01/19 1527    Education Details  Patient educated on HEP, mechanics of exercise    Person(s) Educated  Patient    Methods  Explanation;Demonstration    Comprehension  Verbalized understanding;Returned demonstration       PT Short Term Goals - 06/21/19 1751      PT SHORT TERM GOAL #1   Title  Patient will be independent with initial HEP and self-management strategies to improve functional outcomes    Time  3    Period  Weeks    Status  New    Target Date  07/16/19        PT Long Term Goals - 06/21/19 1751      PT LONG TERM GOAL #1   Title  Patient will be able to perform stand x 5 in < 15 seconds to demonstrate improvement in functional mobility and reduced risk for falls.    Time  6    Period  Weeks    Status  New    Target Date  08/06/19      PT LONG TERM GOAL #2   Title  Patient will report at least 75% overall improvement in subjective complaint to indicate improvement in ability to perform ADLs.    Time  6    Period  Weeks    Status  New    Target Date  08/06/19      PT LONG  TERM GOAL #3   Title  Patient will increase BERG score by at least 5 points to demonstrate improvement in functional mobility and reduced risk for falls.    Time  6    Period  Weeks    Status  New    Target Date  08/06/19      PT LONG TERM GOAL #4   Title  Pateitn will report no falls since starting therapy to demonstrate improvement in functional mobility and reduced risk for falls    Time  6    Period  Weeks    Status  New    Target Date  08/06/19            Plan - 07/01/19 1528    Clinical Impression Statement  Patient completes exercises with min verbal cueing for mechanics and prior demonstration. Patient requires verbal cueing throughout session for posture and keeping upright. Patient is motivated to improve strength and reduce her risk of falling. Patient educated on possible soreness following strengthening exercises as well as continuing HEP. She requires UE use on hands to complete sit to stands. Patient will continue to benefit from skilled physical therapy in order to improve function and reduce impairment.    Personal Factors and Comorbidities  Age;Past/Current Experience    Examination-Participation Restrictions  Yard Work;Church;Cleaning;Community Activity;Laundry;Volunteer    Stability/Clinical Decision Making  Stable/Uncomplicated    Rehab Potential  Good    PT Frequency  2x / week    PT Duration  6 weeks    PT Treatment/Interventions  ADLs/Self Care Home Management;Aquatic Therapy;Biofeedback;Cryotherapy;Electrical Stimulation;Contrast Bath;Therapeutic exercise;Orthotic Fit/Training;Compression bandaging;Manual lymph drainage;Patient/family education;Therapeutic activities;Functional mobility training;Cognitive remediation;Manual techniques;Wheelchair mobility training;Passive range of motion;Dry needling;Balance training;Neuromuscular re-education;Gait training;DME Instruction;Iontophoresis 4mg /ml Dexamethasone;Stair training;Moist  Heat;Traction;Ultrasound;Parrafin;Fluidtherapy;Taping;Vasopneumatic Device;Spinal Manipulations;Joint Manipulations;Energy conservation;Splinting;Visual/perceptual remediation/compensation    PT Next Visit Plan  Progress as tolerated with focus on balance, posture and funcitonal strengthening    PT Home Exercise Plan  06/29/19: sit to stand, heel raise at sink, and staggered standing at sink       Patient will benefit from skilled therapeutic intervention in order to improve the following deficits and impairments:  Abnormal gait, Postural dysfunction, Decreased activity tolerance, Decreased endurance, Decreased strength, Decreased balance, Difficulty walking, Hypomobility  Visit Diagnosis: Repeated falls  Other abnormalities of gait and mobility     Problem List Patient Active Problem List   Diagnosis Date Noted  . SVT (supraventricular tachycardia) ([place]) 12/23/2018  . Bloating 08/30/2015  . Exertional chest pain 08/16/2015  . Chest pain 08/16/2015  . Mucosal abnormality of stomach   . Hemorrhoids 04/18/2015  . IBS (irritable bowel syndrome) 01/30/2015  . Type 2 diabetes mellitus ([place]) 12/22/2014  . Hyperlipidemia 12/22/2014  . Prolapse of female pelvic organs 11/28/2013  . Precordial pain 11/25/2013  . Essential hypertension 11/25/2013  . Heart murmur 11/25/2013  . Osteopenia 10/23/2012  . Chronic pancreatitis ([place]) 02/26/2011  . GERD (gastroesophageal reflux disease) 01/18/2011  . Constipation 11/29/2010  . Esophageal spasm 11/29/2010  . DJD (degenerative joint disease) 11/29/2010  . Hx of adenomatous colonic polyps 06/29/2009  . ESOPHAGEAL MOTILITY DISORDER 04/01/2008  . DM 03/01/2008  . Prolapsed hemorrhoids 07/02/2007  . Diverticulosis of large intestine 07/02/2007    4:00 PM, 07/01/19 Mearl Latin PT, DPT Physical Therapist at [facility] [place], [place],  [address] Phone: [phone]   Fax:  [phone]  Name: Deborah Gray MRN: [id number] Date of Birth: 1933/06/12

## 2019-07-02 ENCOUNTER — Other Ambulatory Visit: Payer: Self-pay | Admitting: Gastroenterology

## 2019-07-02 DIAGNOSIS — K861 Other chronic pancreatitis: Secondary | ICD-10-CM

## 2019-07-05 ENCOUNTER — Ambulatory Visit (INDEPENDENT_AMBULATORY_CARE_PROVIDER_SITE_OTHER): Payer: BC Managed Care – PPO | Admitting: Family Medicine

## 2019-07-05 ENCOUNTER — Encounter: Payer: Self-pay | Admitting: Family Medicine

## 2019-07-05 ENCOUNTER — Other Ambulatory Visit: Payer: Self-pay

## 2019-07-05 VITALS — BP 138/76 | Temp 97.5°F | Ht 64.0 in | Wt 139.6 lb

## 2019-07-05 DIAGNOSIS — S233XXA Sprain of ligaments of thoracic spine, initial encounter: Secondary | ICD-10-CM | POA: Diagnosis not present

## 2019-07-05 DIAGNOSIS — S338XXA Sprain of other parts of lumbar spine and pelvis, initial encounter: Secondary | ICD-10-CM | POA: Diagnosis not present

## 2019-07-05 DIAGNOSIS — S134XXA Sprain of ligaments of cervical spine, initial encounter: Secondary | ICD-10-CM | POA: Diagnosis not present

## 2019-07-05 DIAGNOSIS — Z0000 Encounter for general adult medical examination without abnormal findings: Secondary | ICD-10-CM

## 2019-07-05 MED ORDER — TRAMADOL HCL 50 MG PO TABS: 50.0000 mg | ORAL_TABLET | Freq: Four times a day (QID) | ORAL | 2 refills | Status: AC | PRN

## 2019-07-05 MED ORDER — ZOSTER VAC RECOMB ADJUVANTED 50 MCG/0.5ML IM SUSR: 0.5000 mL | Freq: Once | INTRAMUSCULAR | 1 refills | Status: AC

## 2019-07-05 NOTE — Progress Notes (Signed)
   Subjective:    Patient ID: Deborah Gray, female    DOB: 10-20-1933, 86 y.o.   MRN: [id number]  HPI AWV- Annual Wellness Visit  The patient was seen for their annual wellness visit. The patient's past medical history, surgical history, and family history were reviewed. Pertinent vaccines were reviewed ( tetanus, pneumonia, shingles, flu) The patient's medication list was reviewed and updated.  The height and weight were entered.  BMI recorded in electronic record elsewhere  Cognitive screening was completed. Outcome of Mini - Cog: pass   Falls /wellness exam screening electronically recorded within record elsewhere  Current tobacco usage: none (All patients who use tobacco were given written and verbal information on quitting)  Recent listing of emergency department/hospitalizations over the past year were reviewed.  current specialist the patient sees on a regular basis: Dr. Sydell Axon - GI,  Dr. Alyson Ingles - urology,  chiropractor    Medicare annual wellness visit patient questionnaire was reviewed.  A written screening schedule for the patient for the next 5-10 years was   Appropriate discussion of followup regarding next visit was discussed.   mammo utd  ifint cystitis rxs definitely helping  See prior note working with phys therapy, having some weakness   Has had the covid vacine  Still having some major pain at times  Had the shingles vaccine wihen they first came   Review of Systems No headache no chest pain or shortness of breath Physical exam see vitals  Alert oriented no normal.  Lungs clear.  Heart regular rhythm.  Breast without masses  Abdominal exam pelvic exam rectal exam performed by GI  Legs without edema  Impression wellness exam diet discussed.  Exercise discussed.  Up-to-date on all vaccines except Shingrix.  Prescribed.  Physical therapy.  Did get the Covid vaccine.  Patient to follow-up.  6 months for chronic concerns

## 2019-07-05 NOTE — Progress Notes (Deleted)
   Subjective:    Patient ID: Deborah Gray, female    DOB: 1933-04-05, 86 y.o.   MRN: [id number]  HPI AWV- Annual Wellness Visit  The patient was seen for their annual wellness visit. The patient's past medical history, surgical history, and family history were reviewed. Pertinent vaccines were reviewed ( tetanus, pneumonia, shingles, flu) The patient's medication list was reviewed and updated.  The height and weight were entered.  BMI recorded in electronic record elsewhere  Cognitive screening was completed. Outcome of Mini - Cog: pass   Falls /depression screening electronically recorded within record elsewhere  Current tobacco usage: none (All patients who use tobacco were given written and verbal information on quitting)  Recent listing of emergency department/hospitalizations over the past year were reviewed.  current specialist the patient sees on a regular basis: Dr. Sydell Axon - GI,  Dr. Alyson Ingles - urology,  chiropractor    Medicare annual wellness visit patient questionnaire was reviewed.  A written screening schedule for the patient for the next 5-10 years was given. Appropriate discussion of followup regarding next visit was discussed.       Review of Systems     Objective:   Physical Exam        Assessment & Plan:

## 2019-07-06 ENCOUNTER — Encounter (HOSPITAL_COMMUNITY): Payer: Self-pay | Admitting: Physical Therapy

## 2019-07-06 ENCOUNTER — Ambulatory Visit (HOSPITAL_COMMUNITY): Payer: Medicare Other | Admitting: Physical Therapy

## 2019-07-06 ENCOUNTER — Telehealth: Payer: Self-pay

## 2019-07-06 DIAGNOSIS — R2689 Other abnormalities of gait and mobility: Secondary | ICD-10-CM | POA: Diagnosis not present

## 2019-07-06 DIAGNOSIS — R296 Repeated falls: Secondary | ICD-10-CM

## 2019-07-06 NOTE — Therapy (Signed)
[place], [place], [address] Phone: [phone]   Fax:  [phone]  Physical Therapy Treatment  Patient Details  Name: Deborah Gray MRN: [id number] Date of Birth: Mar 14, 1933 Referring Provider (PT): Baltazar Apo MD    Encounter Date: 07/06/2019  PT End of Session - 07/06/19 1524    Visit Number  4    Number of Visits  12    Date for PT Re-Evaluation  08/06/19    Authorization Type  BCBC Medicare; [place] state health secondary, [employer] tertiary; No auth req. no V.L.    Progress Note Due on Visit  10    PT Start Time  1520    PT Stop Time  1600    PT Time Calculation (min)  40 min    Activity Tolerance  Patient tolerated treatment well    Behavior During Therapy  WFL for tasks assessed/performed       Past Medical History:  Diagnosis Date  . Arthritis   . Benign neoplasm of colon   . Constipation   . Diaphragmatic hernia without mention of obstruction or gangrene   . Diverticulosis of colon (without mention of hemorrhage)   . Dysphagia, pharyngoesophageal phase   . Esophageal reflux   . Essential hypertension   . Heart murmur   . Hemorrhoids   . Hyperlipidemia   . Internal hemorrhoids without mention of complication   . Left wrist fracture   . PONV (postoperative nausea and vomiting)   . Stricture and stenosis of esophagus   . Type 2 diabetes mellitus ([place])   . Unspecified gastritis and gastroduodenitis without mention of hemorrhage     Past Surgical History:  Procedure Laterality Date  . ABDOMINAL HYSTERECTOMY    . BACTERIAL OVERGROWTH TEST N/A 09/06/2015   Procedure: BACTERIAL OVERGROWTH TEST;  Surgeon: Daneil Dolin, MD;  [facility];  Service: Endoscopy;  Laterality: N/A;  800  . BREAST LUMPECTOMY Right    benign  . CATARACT EXTRACTION Bilateral 2006   Implants in both, surgeries done 6 weeks apart  . CHOLECYSTECTOMY    . COLONOSCOPY  03/08/02   [name]: diverticulosis,  internal hemorrhoids, adenomatous colon polyp  . COLONOSCOPY  01/23/04   [name]: diverticulosis, internal hemorrhoids  . COLONOSCOPY  09/25/05   [name]: diverticulosis  . COLONOSCOPY  07/05/09   [name]: severe diverticulosis  . COLONOSCOPY  01/21/11   [name]: severe diverticulosis in sigmoid to desc colon, int hemorrhoids, follow up TCS in 5 years  . COLONOSCOPY N/A 04/10/2016   Procedure: COLONOSCOPY;  Surgeon: Daneil Dolin, MD;  [facility];  Service: Endoscopy;  Laterality: N/A;  12:30 PM  . DILATION AND CURETTAGE OF UTERUS    . ESOPHAGEAL MANOMETRY  03/21/08   Patterson: findings c/w Nutcracker Esophagus  . ESOPHAGOGASTRODUODENOSCOPY  03/08/02   [name]: esophageal stricture, chronic gerd, s/p dilation.   . ESOPHAGOGASTRODUODENOSCOPY  01/23/04   [name]: esophageal stricture, gastritis, hiatal hernia  . ESOPHAGOGASTRODUODENOSCOPY  09/25/05   Patterson: gastritis, benign bx, no H.pylori  . ESOPHAGOGASTRODUODENOSCOPY  07/05/09   Patterson: gastropathy, benign small bowel bx and gastric bx  . ESOPHAGOGASTRODUODENOSCOPY N/A 05/15/2015   Dr. Gala Romney- diffuse moderate inflammation characterized by congestion (edema), erythema, and linear erosions was found in the entire examined stomach. bx= reactive gastropathy  . FOOT SURGERY Left   . HEMORRHOID BANDING  2017   Dr.[name]  . LAPAROSCOPIC VAGINAL HYSTERECTOMY    . ORIF WRIST FRACTURE Left 06/23/2018  Procedure: OPEN REDUCTION INTERNAL FIXATION (ORIF) LEFT WRIST FRACTURE;  Surgeon: Renette Butters, MD;  Location: [facility];  Service: Orthopedics;  Laterality: Left;  regional arm block  . RECTOCELE REPAIR    . TOTAL KNEE ARTHROPLASTY Right     There were no vitals filed for this visit.  Subjective Assessment - 07/06/19 1522    Subjective  Patient says she had a flare up of sciatica over the weekend. Says it was hard for her to get up out of the chair. Says she went to chiropractor and had adjustment and  was feeling much better. Says her back is not bothering her much currently. Says she has been taking prescribed medication for pain as well recently, due to flare up.    Pertinent History  RT TKA in 2010; Hx of vertigo    Patient Stated Goals  Be more steady on my feet, I'd like ot be a little straighter (posture)    Currently in Pain?  No/denies                        [facility] Adult PT Treatment/Exercise - 07/06/19 0001      Knee/Hip Exercises: Standing   Heel Raises  Both;2 sets;10 reps    Lateral Step Up  Both;10 reps;Hand Hold: 1;Step Height: 4";1 set    Forward Step Up  Both;1 set;10 reps;Hand Hold: 1;Step Height: 4"    Other Standing Knee Exercises  staggered tandem stance 2x 30" by counter; standing rows/ extensions RTB 2 x 10 each     Other Standing Knee Exercises  sidestepping at mat x 5RT       Knee/Hip Exercises: Seated   Sit to Sand  2 sets;5 reps;without UE support          Balance Exercises - 07/06/19 1544      Balance Exercises: Standing   Standing Eyes Opened  Narrow base of support (BOS);Head turns;Solid surface;Other reps (comment)   10 reps    Other Standing Exercises Comments  step taps 4 inch x20           PT Short Term Goals - 06/21/19 1751      PT SHORT TERM GOAL #1   Title  Patient will be independent with initial HEP and self-management strategies to improve functional outcomes    Time  3    Period  Weeks    Status  New    Target Date  07/16/19        PT Long Term Goals - 06/21/19 1751      PT LONG TERM GOAL #1   Title  Patient will be able to perform stand x 5 in < 15 seconds to demonstrate improvement in functional mobility and reduced risk for falls.    Time  6    Period  Weeks    Status  New    Target Date  08/06/19      PT LONG TERM GOAL #2   Title  Patient will report at least 75% overall improvement in subjective complaint to indicate improvement in ability to perform ADLs.    Time  6    Period  Weeks    Status   New    Target Date  08/06/19      PT LONG TERM GOAL #3   Title  Patient will increase BERG score by at least 5 points to demonstrate improvement in functional mobility and reduced risk for falls.  Time  6    Period  Weeks    Status  New    Target Date  08/06/19      PT LONG TERM GOAL #4   Title  Pateitn will report no falls since starting therapy to demonstrate improvement in functional mobility and reduced risk for falls    Time  6    Period  Weeks    Status  New    Target Date  08/06/19            Plan - 07/06/19 1623    Clinical Impression Statement  Patient tolerated session well today. Patient able to progress static balance and postural strengthening activity today with no complaints. Patient educated on proper form and function of all added activity. Patient cued on posturing during standing static balance as well as band rows and extension for postural strengthening. Patient will continue to benefit from skilled therapy services to progress postural strengthening and balance to improve functional mobility and reduce risk for future falls.    Personal Factors and Comorbidities  Age;Past/Current Experience    Examination-Participation Restrictions  Yard Work;Church;Cleaning;Community Activity;Laundry;Volunteer    Stability/Clinical Decision Making  Stable/Uncomplicated    Rehab Potential  Good    PT Frequency  2x / week    PT Duration  6 weeks    PT Treatment/Interventions  ADLs/Self Care Home Management;Aquatic Therapy;Biofeedback;Cryotherapy;Electrical Stimulation;Contrast Bath;Therapeutic exercise;Orthotic Fit/Training;Compression bandaging;Manual lymph drainage;Patient/family education;Therapeutic activities;Functional mobility training;Cognitive remediation;Manual techniques;Wheelchair mobility training;Passive range of motion;Dry needling;Balance training;Neuromuscular re-education;Gait training;DME Instruction;Iontophoresis 4mg /ml Dexamethasone;Stair training;Moist  Heat;Traction;Ultrasound;Parrafin;Fluidtherapy;Taping;Vasopneumatic Device;Spinal Manipulations;Joint Manipulations;Energy conservation;Splinting;Visual/perceptual remediation/compensation    PT Next Visit Plan  Progress as tolerated with focus on balance, posture and funcitonal strengthening.    PT Home Exercise Plan  06/29/19: sit to stand, heel raise at sink, and staggered standing at sink    Consulted and Agree with Plan of Care  Patient       Patient will benefit from skilled therapeutic intervention in order to improve the following deficits and impairments:  Abnormal gait, Postural dysfunction, Decreased activity tolerance, Decreased endurance, Decreased strength, Decreased balance, Difficulty walking, Hypomobility  Visit Diagnosis: Repeated falls  Other abnormalities of gait and mobility     Problem List Patient Active Problem List   Diagnosis Date Noted  . SVT (supraventricular tachycardia) ([place]) 12/23/2018  . Bloating 08/30/2015  . Exertional chest pain 08/16/2015  . Chest pain 08/16/2015  . Mucosal abnormality of stomach   . Hemorrhoids 04/18/2015  . IBS (irritable bowel syndrome) 01/30/2015  . Type 2 diabetes mellitus ([place]) 12/22/2014  . Hyperlipidemia 12/22/2014  . Prolapse of female pelvic organs 11/28/2013  . Precordial pain 11/25/2013  . Essential hypertension 11/25/2013  . Heart murmur 11/25/2013  . Osteopenia 10/23/2012  . Chronic pancreatitis ([place]) 02/26/2011  . GERD (gastroesophageal reflux disease) 01/18/2011  . Constipation 11/29/2010  . Esophageal spasm 11/29/2010  . DJD (degenerative joint disease) 11/29/2010  . Hx of adenomatous colonic polyps 06/29/2009  . ESOPHAGEAL MOTILITY DISORDER 04/01/2008  . DM 03/01/2008  . Prolapsed hemorrhoids 07/02/2007  . Diverticulosis of large intestine 07/02/2007   5:07 PM, 07/06/19 Josue Hector PT DPT  Physical Therapist with [facility]  [phone]   [facility] [address] [place], [place], [address] Phone: [phone]   Fax:  [phone]  Name: Deborah Gray MRN: [id number] Date of Birth: 1933-12-19

## 2019-07-06 NOTE — Telephone Encounter (Signed)
PA was submitted through [email or web address] for  [employer]. Waiting on an approval or denial.

## 2019-07-07 ENCOUNTER — Encounter: Payer: Self-pay | Admitting: Internal Medicine

## 2019-07-07 DIAGNOSIS — M9901 Segmental and somatic dysfunction of cervical region: Secondary | ICD-10-CM | POA: Diagnosis not present

## 2019-07-07 DIAGNOSIS — M9902 Segmental and somatic dysfunction of thoracic region: Secondary | ICD-10-CM | POA: Diagnosis not present

## 2019-07-07 DIAGNOSIS — S134XXA Sprain of ligaments of cervical spine, initial encounter: Secondary | ICD-10-CM | POA: Diagnosis not present

## 2019-07-07 DIAGNOSIS — S233XXA Sprain of ligaments of thoracic spine, initial encounter: Secondary | ICD-10-CM | POA: Diagnosis not present

## 2019-07-08 ENCOUNTER — Ambulatory Visit (HOSPITAL_COMMUNITY)
Admission: RE | Admit: 2019-07-08 | Discharge: 2019-07-08 | Disposition: A | Discharge status: Home / Self Care | Payer: Medicare Other | Source: Ambulatory Visit | Attending: Student | Admitting: Student

## 2019-07-08 ENCOUNTER — Other Ambulatory Visit: Payer: Self-pay

## 2019-07-08 ENCOUNTER — Encounter (HOSPITAL_COMMUNITY): Payer: Self-pay | Admitting: Physical Therapy

## 2019-07-08 ENCOUNTER — Ambulatory Visit (HOSPITAL_COMMUNITY): Payer: Medicare Other | Admitting: Physical Therapy

## 2019-07-08 ENCOUNTER — Encounter (HOSPITAL_COMMUNITY)
Admission: RE | Admit: 2019-07-08 | Discharge: 2019-07-08 | Disposition: A | Discharge status: Home / Self Care | Payer: Medicare Other | Source: Ambulatory Visit | Attending: Student | Admitting: Student

## 2019-07-08 DIAGNOSIS — R072 Precordial pain: Secondary | ICD-10-CM | POA: Insufficient documentation

## 2019-07-08 DIAGNOSIS — R0600 Dyspnea, unspecified: Secondary | ICD-10-CM | POA: Insufficient documentation

## 2019-07-08 DIAGNOSIS — R2689 Other abnormalities of gait and mobility: Secondary | ICD-10-CM | POA: Diagnosis not present

## 2019-07-08 DIAGNOSIS — R296 Repeated falls: Secondary | ICD-10-CM

## 2019-07-08 LAB — NM MYOCAR MULTI W/SPECT W/WALL MOTION / EF
LV dias vol: 82 mL (ref 46–106)
LV sys vol: 29 mL
Peak HR: 105 {beats}/min
RATE: 0.21
Rest HR: 92 {beats}/min
SDS: 4
SRS: 0
SSS: 4
TID: 1.09

## 2019-07-08 MED ORDER — SODIUM CHLORIDE FLUSH 0.9 % IV SOLN
INTRAVENOUS | Status: AC
  Administered 2019-07-08: 10 mL via INTRAVENOUS
  Filled 2019-07-08: qty 10

## 2019-07-08 MED ORDER — TECHNETIUM TC 99M TETROFOSMIN IV KIT
10.0000 | PACK | Freq: Once | INTRAVENOUS | Status: AC | PRN
  Administered 2019-07-08: 11 via INTRAVENOUS

## 2019-07-08 MED ORDER — REGADENOSON 0.4 MG/5ML IV SOLN
INTRAVENOUS | Status: AC
  Administered 2019-07-08: 0.4 mg via INTRAVENOUS
  Filled 2019-07-08: qty 5

## 2019-07-08 MED ORDER — TECHNETIUM TC 99M TETROFOSMIN IV KIT
30.0000 | PACK | Freq: Once | INTRAVENOUS | Status: AC | PRN
  Administered 2019-07-08: 32 via INTRAVENOUS

## 2019-07-08 NOTE — Therapy (Signed)
[place], [place], [address] Phone: [phone]   Fax:  [phone]  Physical Therapy Treatment  Patient Details  Name: Deborah Gray MRN: [id number] Date of Birth: 1933/08/19 Referring Provider (PT): Baltazar Apo MD    Encounter Date: 07/08/2019  PT End of Session - 07/08/19 1527    Visit Number  5    Number of Visits  12    Date for PT Re-Evaluation  08/06/19    Authorization Type  BCBC Medicare; [place] state [employer] secondary, [employer] tertiary; No auth req. no V.L.    Progress Note Due on Visit  10    PT Start Time  1525   arrives late   PT Stop Time  1603    PT Time Calculation (min)  38 min    Activity Tolerance  Patient tolerated treatment well    Behavior During Therapy  WFL for tasks assessed/performed       Past Medical History:  Diagnosis Date  . Arthritis   . Benign neoplasm of colon   . Constipation   . Diaphragmatic hernia without mention of obstruction or gangrene   . Diverticulosis of colon (without mention of hemorrhage)   . Dysphagia, pharyngoesophageal phase   . Esophageal reflux   . Essential hypertension   . Heart murmur   . Hemorrhoids   . Hyperlipidemia   . Internal hemorrhoids without mention of complication   . Left wrist fracture   . PONV (postoperative nausea and vomiting)   . Stricture and stenosis of esophagus   . Type 2 diabetes mellitus ([place])   . Unspecified gastritis and gastroduodenitis without mention of hemorrhage     Past Surgical History:  Procedure Laterality Date  . ABDOMINAL HYSTERECTOMY    . BACTERIAL OVERGROWTH TEST N/A 09/06/2015   Procedure: BACTERIAL OVERGROWTH TEST;  Surgeon: Daneil Dolin, MD;  [facility];  Service: Endoscopy;  Laterality: N/A;  800  . BREAST LUMPECTOMY Right    benign  . CATARACT EXTRACTION Bilateral 2006   Implants in both, surgeries done 6 weeks apart  . CHOLECYSTECTOMY    . COLONOSCOPY  03/08/02   [name]:  diverticulosis, internal hemorrhoids, adenomatous colon polyp  . COLONOSCOPY  01/23/04   [name]: diverticulosis, internal hemorrhoids  . COLONOSCOPY  09/25/05   [name]: diverticulosis  . COLONOSCOPY  07/05/09   [name]: severe diverticulosis  . COLONOSCOPY  01/21/11   [name]: severe diverticulosis in sigmoid to desc colon, int hemorrhoids, follow up TCS in 5 years  . COLONOSCOPY N/A 04/10/2016   Procedure: COLONOSCOPY;  Surgeon: Daneil Dolin, MD;  [facility];  Service: Endoscopy;  Laterality: N/A;  12:30 PM  . DILATION AND CURETTAGE OF UTERUS    . ESOPHAGEAL MANOMETRY  03/21/08   Patterson: findings c/w Nutcracker Esophagus  . ESOPHAGOGASTRODUODENOSCOPY  03/08/02   [name]: esophageal stricture, chronic gerd, s/p dilation.   . ESOPHAGOGASTRODUODENOSCOPY  01/23/04   [name]: esophageal stricture, gastritis, hiatal hernia  . ESOPHAGOGASTRODUODENOSCOPY  09/25/05   [facility]terson: gastritis, benign bx, no H.pylori  . ESOPHAGOGASTRODUODENOSCOPY  07/05/09   Patterson: gastropathy, benign small bowel bx and gastric bx  . ESOPHAGOGASTRODUODENOSCOPY N/A 05/15/2015   Dr. Gala Romney- diffuse moderate inflammation characterized by congestion (edema), erythema, and linear erosions was found in the entire examined stomach. bx= reactive gastropathy  . FOOT SURGERY Left   . HEMORRHOID BANDING  2017   Dr.[name]  . LAPAROSCOPIC VAGINAL HYSTERECTOMY    . ORIF WRIST FRACTURE  Left 06/23/2018   Procedure: OPEN REDUCTION INTERNAL FIXATION (ORIF) LEFT WRIST FRACTURE;  Surgeon: Renette Butters, MD;  Location: [place];  Service: Orthopedics;  Laterality: Left;  regional arm block  . RECTOCELE REPAIR    . TOTAL KNEE ARTHROPLASTY Right     There were no vitals filed for this visit.  Subjective Assessment - 07/08/19 1525    Subjective  Patient states she is late because she is she got stuck in traffic and had to go to the bathroom. Her back is acting up today. She had stress test  today.    Pertinent History  RT TKA in 2010; Hx of vertigo    Patient Stated Goals  Be more steady on my feet, I'd like ot be a little straighter (posture)    Currently in Pain?  Yes    Pain Score  5     Pain Location  Back                        [facility] Adult PT Treatment/Exercise - 07/08/19 0001      Knee/Hip Exercises: Standing   Heel Raises  Both;2 sets;10 reps    Forward Step Up  Both;1 set;10 reps;Hand Hold: 1;Step Height: 4"    Other Standing Knee Exercises  staggered tandem stance 3x 30" bilateral; standing rows/ extensions RTB 2 x 10 each              PT Education - 07/08/19 1527    Education Details  Patient educated on HEP, mechanics of exercise    Person(s) Educated  Patient    Methods  Explanation;Demonstration    Comprehension  Verbalized understanding;Returned demonstration       PT Short Term Goals - 06/21/19 1751      PT SHORT TERM GOAL #1   Title  Patient will be independent with initial HEP and self-management strategies to improve functional outcomes    Time  3    Period  Weeks    Status  New    Target Date  07/16/19        PT Long Term Goals - 06/21/19 1751      PT LONG TERM GOAL #1   Title  Patient will be able to perform stand x 5 in < 15 seconds to demonstrate improvement in functional mobility and reduced risk for falls.    Time  6    Period  Weeks    Status  New    Target Date  08/06/19      PT LONG TERM GOAL #2   Title  Patient will report at least 75% overall improvement in subjective complaint to indicate improvement in ability to perform ADLs.    Time  6    Period  Weeks    Status  New    Target Date  08/06/19      PT LONG TERM GOAL #3   Title  Patient will increase BERG score by at least 5 points to demonstrate improvement in functional mobility and reduced risk for falls.    Time  6    Period  Weeks    Status  New    Target Date  08/06/19      PT LONG TERM GOAL #4   Title  Pateitn will report no falls  since starting therapy to demonstrate improvement in functional mobility and reduced risk for falls    Time  6    Period  Weeks  Status  New    Target Date  08/06/19            Plan - 07/08/19 1528    Clinical Impression Statement  Patient limited by fatigue today. She tolerates heel raises and step ups with min verbal cueing for positioning and mechanics. Patient requires verbal cueing for slow and controlled rows as well as for upright posture. She requires frequent rest breaks and seated rest breaks today. She demonstrates good static balance today with tandem stance. Patient will continue to benefit from skilled physical therapy in order to improve function and reduce impairment.    Personal Factors and Comorbidities  Age;Past/Current Experience    Examination-Participation Restrictions  Yard Work;Church;Cleaning;Community Activity;Laundry;Volunteer    Stability/Clinical Decision Making  Stable/Uncomplicated    Rehab Potential  Good    PT Frequency  2x / week    PT Duration  6 weeks    PT Treatment/Interventions  ADLs/Self Care Home Management;Aquatic Therapy;Biofeedback;Cryotherapy;Electrical Stimulation;Contrast Bath;Therapeutic exercise;Orthotic Fit/Training;Compression bandaging;Manual lymph drainage;Patient/family education;Therapeutic activities;Functional mobility training;Cognitive remediation;Manual techniques;Wheelchair mobility training;Passive range of motion;Dry needling;Balance training;Neuromuscular re-education;Gait training;DME Instruction;Iontophoresis 4mg /ml Dexamethasone;Stair training;Moist Heat;Traction;Ultrasound;Parrafin;Fluidtherapy;Taping;Vasopneumatic Device;Spinal Manipulations;Joint Manipulations;Energy conservation;Splinting;Visual/perceptual remediation/compensation    PT Next Visit Plan  Progress as tolerated with focus on balance, posture and funcitonal strengthening.    PT Home Exercise Plan  06/29/19: sit to stand, heel raise at sink, and staggered  standing at sink    Consulted and Agree with Plan of Care  Patient       Patient will benefit from skilled therapeutic intervention in order to improve the following deficits and impairments:  Abnormal gait, Postural dysfunction, Decreased activity tolerance, Decreased endurance, Decreased strength, Decreased balance, Difficulty walking, Hypomobility  Visit Diagnosis: Repeated falls  Other abnormalities of gait and mobility     Problem List Patient Active Problem List   Diagnosis Date Noted  . SVT (supraventricular tachycardia) ([place]) 12/23/2018  . Bloating 08/30/2015  . Exertional chest pain 08/16/2015  . Chest pain 08/16/2015  . Mucosal abnormality of stomach   . Hemorrhoids 04/18/2015  . IBS (irritable bowel syndrome) 01/30/2015  . Type 2 diabetes mellitus ([place]) 12/22/2014  . Hyperlipidemia 12/22/2014  . Prolapse of female pelvic organs 11/28/2013  . Precordial pain 11/25/2013  . Essential hypertension 11/25/2013  . Heart murmur 11/25/2013  . Osteopenia 10/23/2012  . Chronic pancreatitis ([place]) 02/26/2011  . GERD (gastroesophageal reflux disease) 01/18/2011  . Constipation 11/29/2010  . Esophageal spasm 11/29/2010  . DJD (degenerative joint disease) 11/29/2010  . Hx of adenomatous colonic polyps 06/29/2009  . ESOPHAGEAL MOTILITY DISORDER 04/01/2008  . DM 03/01/2008  . Prolapsed hemorrhoids 07/02/2007  . Diverticulosis of large intestine 07/02/2007   4:04 PM, 07/08/19 Mearl Latin PT, DPT Physical Therapist at [place], [place], [address] Phone: [phone]   Fax:  [phone]  Name: Deborah Gray MRN: [id number] Date of Birth: [birth year]

## 2019-07-12 DIAGNOSIS — S338XXA Sprain of other parts of lumbar spine and pelvis, initial encounter: Secondary | ICD-10-CM | POA: Diagnosis not present

## 2019-07-12 DIAGNOSIS — S233XXA Sprain of ligaments of thoracic spine, initial encounter: Secondary | ICD-10-CM | POA: Diagnosis not present

## 2019-07-12 DIAGNOSIS — S134XXA Sprain of ligaments of cervical spine, initial encounter: Secondary | ICD-10-CM | POA: Diagnosis not present

## 2019-07-13 ENCOUNTER — Other Ambulatory Visit: Payer: Self-pay

## 2019-07-13 ENCOUNTER — Ambulatory Visit (HOSPITAL_COMMUNITY): Payer: Medicare Other | Admitting: Physical Therapy

## 2019-07-13 ENCOUNTER — Encounter (HOSPITAL_COMMUNITY): Payer: Self-pay | Admitting: Physical Therapy

## 2019-07-13 DIAGNOSIS — R296 Repeated falls: Secondary | ICD-10-CM

## 2019-07-13 DIAGNOSIS — R2689 Other abnormalities of gait and mobility: Secondary | ICD-10-CM

## 2019-07-13 NOTE — Therapy (Signed)
[place], [place], [address] Phone: [phone]   Fax:  [phone]  Physical Therapy Treatment  Patient Details  Name: Deborah Gray MRN: [id number] Date of Birth: 04-01-1933 Referring Provider (PT): Baltazar Apo MD    Encounter Date: 07/13/2019  PT End of Session - 07/13/19 1524    Visit Number  6    Number of Visits  12    Date for PT Re-Evaluation  08/06/19    Authorization Type  BCBC Medicare; [place] state health secondary, [employer] tertiary; No auth req. no V.L.    Progress Note Due on Visit  10    PT Start Time  1515    PT Stop Time  1600    PT Time Calculation (min)  45 min    Activity Tolerance  Patient tolerated treatment well    Behavior During Therapy  WFL for tasks assessed/performed       Past Medical History:  Diagnosis Date  . Arthritis   . Benign neoplasm of colon   . Constipation   . Diaphragmatic hernia without mention of obstruction or gangrene   . Diverticulosis of colon (without mention of hemorrhage)   . Dysphagia, pharyngoesophageal phase   . Esophageal reflux   . Essential hypertension   . Heart murmur   . Hemorrhoids   . Hyperlipidemia   . Internal hemorrhoids without mention of complication   . Left wrist fracture   . PONV (postoperative nausea and vomiting)   . Stricture and stenosis of esophagus   . Type 2 diabetes mellitus ([place])   . Unspecified gastritis and gastroduodenitis without mention of hemorrhage     Past Surgical History:  Procedure Laterality Date  . ABDOMINAL HYSTERECTOMY    . BACTERIAL OVERGROWTH TEST N/A 09/06/2015   Procedure: BACTERIAL OVERGROWTH TEST;  Surgeon: Daneil Dolin, MD;  [facility];  Service: Endoscopy;  Laterality: N/A;  800  . BREAST LUMPECTOMY Right    benign  . CATARACT EXTRACTION Bilateral 2006   Implants in both, surgeries done 6 weeks apart  . CHOLECYSTECTOMY    . COLONOSCOPY  03/08/02   [name]: diverticulosis,  internal hemorrhoids, adenomatous colon polyp  . COLONOSCOPY  01/23/04   [name]: diverticulosis, internal hemorrhoids  . COLONOSCOPY  09/25/05   [name]: diverticulosis  . COLONOSCOPY  07/05/09   [name]: severe diverticulosis  . COLONOSCOPY  01/21/11   [name]: severe diverticulosis in sigmoid to desc colon, int hemorrhoids, follow up TCS in 5 years  . COLONOSCOPY N/A 04/10/2016   Procedure: COLONOSCOPY;  Surgeon: Daneil Dolin, MD;  [facility];  Service: Endoscopy;  Laterality: N/A;  12:30 PM  . DILATION AND CURETTAGE OF UTERUS    . ESOPHAGEAL MANOMETRY  03/21/08   Patterson: findings c/w Nutcracker Esophagus  . ESOPHAGOGASTRODUODENOSCOPY  03/08/02   [name]: esophageal stricture, chronic gerd, s/p dilation.   . ESOPHAGOGASTRODUODENOSCOPY  01/23/04   [name]: esophageal stricture, gastritis, hiatal hernia  . ESOPHAGOGASTRODUODENOSCOPY  09/25/05   Patterson: gastritis, benign bx, no H.pylori  . ESOPHAGOGASTRODUODENOSCOPY  07/05/09   Patterson: gastropathy, benign small bowel bx and gastric bx  . ESOPHAGOGASTRODUODENOSCOPY N/A 05/15/2015   Dr. Gala Romney- diffuse moderate inflammation characterized by congestion (edema), erythema, and linear erosions was found in the entire examined stomach. bx= reactive gastropathy  . FOOT SURGERY Left   . HEMORRHOID BANDING  2017   Dr.[name]  . LAPAROSCOPIC VAGINAL HYSTERECTOMY    . ORIF WRIST FRACTURE Left 06/23/2018  Procedure: OPEN REDUCTION INTERNAL FIXATION (ORIF) LEFT WRIST FRACTURE;  Surgeon: Renette Butters, MD;  Location: [place];  Service: Orthopedics;  Laterality: Left;  regional arm block  . RECTOCELE REPAIR    . TOTAL KNEE ARTHROPLASTY Right     There were no vitals filed for this visit.  Subjective Assessment - 07/13/19 1523    Subjective  Patient says she is doing much better today. She says she has been thinking about her blood sugar pill regimen and says she thinks she has not been taking the right  dose. Says she is thinking to talk to her PA about this. Wants to see how she feels today. Went to [employer] yesterday and he fixed me up". No pain currently    Pertinent History  RT TKA in 2010; Hx of vertigo    Patient Stated Goals  Be more steady on my feet, I'd like ot be a little straighter (posture)    Currently in Pain?  No/denies                        [facility] Adult PT Treatment/Exercise - 07/13/19 0001      Knee/Hip Exercises: Standing   Heel Raises  Both;2 sets;10 reps    Hip Abduction  Both;1 set;15 reps;Knee straight    Hip Extension  Both;1 set;15 reps;Knee straight    Lateral Step Up  Both;1 set;10 reps;Hand Hold: 1;Step Height: 4"    Forward Step Up  Both;1 set;10 reps;Hand Hold: 1;Step Height: 4"    Other Standing Knee Exercises  staggered tandem stance 3x 30" on foam; standing rows/ extensions RTB x15 each     Other Standing Knee Exercises  NBOS on foam with head turns x10      Knee/Hip Exercises: Seated   Sit to Sand  1 set;10 reps;without UE support               PT Short Term Goals - 06/21/19 1751      PT SHORT TERM GOAL #1   Title  Patient will be independent with initial HEP and self-management strategies to improve functional outcomes    Time  3    Period  Weeks    Status  New    Target Date  07/16/19        PT Long Term Goals - 06/21/19 1751      PT LONG TERM GOAL #1   Title  Patient will be able to perform stand x 5 in < 15 seconds to demonstrate improvement in functional mobility and reduced risk for falls.    Time  6    Period  Weeks    Status  New    Target Date  08/06/19      PT LONG TERM GOAL #2   Title  Patient will report at least 75% overall improvement in subjective complaint to indicate improvement in ability to perform ADLs.    Time  6    Period  Weeks    Status  New    Target Date  08/06/19      PT LONG TERM GOAL #3   Title  Patient will increase BERG score by at least 5 points to demonstrate improvement in  functional mobility and reduced risk for falls.    Time  6    Period  Weeks    Status  New    Target Date  08/06/19      PT LONG TERM GOAL #  4   Title  Pateitn will report no falls since starting therapy to demonstrate improvement in functional mobility and reduced risk for falls    Time  6    Period  Weeks    Status  New    Target Date  08/06/19            Plan - 07/13/19 1600    Clinical Impression Statement  Patient tolerated session well today, did note mild fatigue with ther ex progressions. Patient able to increase reps with standing hip strengthening, and able to progress static balance to compliant surface. Patient was well challenged with added foam to tandem standing but was able to improve with practice. Patient cued on proper form and mechanics with band rows and extensions to avoid strain to LT upper trap, as previously noted. patient also requires verbal cues for upright posture and looking straight forwards with standing balance exercise. patient will continue to benefit from skilled therapy services to progress LE/ core strength and balance to reduce pain and improve functional mobility.    Personal Factors and Comorbidities  Age;Past/Current Experience    Examination-Participation Restrictions  Yard Work;Church;Cleaning;Community Activity;Laundry;Volunteer    Stability/Clinical Decision Making  Stable/Uncomplicated    Rehab Potential  Good    PT Frequency  2x / week    PT Duration  6 weeks    PT Treatment/Interventions  ADLs/Self Care Home Management;Aquatic Therapy;Biofeedback;Cryotherapy;Electrical Stimulation;Contrast Bath;Therapeutic exercise;Orthotic Fit/Training;Compression bandaging;Manual lymph drainage;Patient/family education;Therapeutic activities;Functional mobility training;Cognitive remediation;Manual techniques;Wheelchair mobility training;Passive range of motion;Dry needling;Balance training;Neuromuscular re-education;Gait training;DME  Instruction;Iontophoresis 4mg /ml Dexamethasone;Stair training;Moist Heat;Traction;Ultrasound;Parrafin;Fluidtherapy;Taping;Vasopneumatic Device;Spinal Manipulations;Joint Manipulations;Energy conservation;Splinting;Visual/perceptual remediation/compensation    PT Next Visit Plan  Progress as tolerated with focus on balance, posture and funcitonal strengthening. Add palloff pressing next visit    PT Home Exercise Plan  06/29/19: sit to stand, heel raise at sink, and staggered standing at sink    Consulted and Agree with Plan of Care  Patient       Patient will benefit from skilled therapeutic intervention in order to improve the following deficits and impairments:  Abnormal gait, Postural dysfunction, Decreased activity tolerance, Decreased endurance, Decreased strength, Decreased balance, Difficulty walking, Hypomobility  Visit Diagnosis: Repeated falls  Other abnormalities of gait and mobility     Problem List Patient Active Problem List   Diagnosis Date Noted  . SVT (supraventricular tachycardia) ([place]) 12/23/2018  . Bloating 08/30/2015  . Exertional chest pain 08/16/2015  . Chest pain 08/16/2015  . Mucosal abnormality of stomach   . Hemorrhoids 04/18/2015  . IBS (irritable bowel syndrome) 01/30/2015  . Type 2 diabetes mellitus ([place]) 12/22/2014  . Hyperlipidemia 12/22/2014  . Prolapse of female pelvic organs 11/28/2013  . Precordial pain 11/25/2013  . Essential hypertension 11/25/2013  . Heart murmur 11/25/2013  . Osteopenia 10/23/2012  . Chronic pancreatitis ([place]) 02/26/2011  . GERD (gastroesophageal reflux disease) 01/18/2011  . Constipation 11/29/2010  . Esophageal spasm 11/29/2010  . DJD (degenerative joint disease) 11/29/2010  . Hx of adenomatous colonic polyps 06/29/2009  . ESOPHAGEAL MOTILITY DISORDER 04/01/2008  . DM 03/01/2008  . Prolapsed hemorrhoids 07/02/2007  . Diverticulosis of large intestine 07/02/2007   4:08 PM, 07/13/19 Josue Hector PT DPT  Physical  Therapist with [facility]  [phone]   [facility] [address] [place], [place], [address] Phone: [phone]   Fax:  [phone]  Name: Deborah Gray MRN: [id number] Date of Birth: Jul 21, 1933

## 2019-07-14 ENCOUNTER — Ambulatory Visit (INDEPENDENT_AMBULATORY_CARE_PROVIDER_SITE_OTHER): Payer: Medicare Other

## 2019-07-14 DIAGNOSIS — N3010 Interstitial cystitis (chronic) without hematuria: Secondary | ICD-10-CM

## 2019-07-14 LAB — POCT URINALYSIS DIPSTICK
Bilirubin, UA: NEGATIVE
Glucose, UA: NEGATIVE
Ketones, UA: NEGATIVE
Leukocytes, UA: NEGATIVE
Nitrite, UA: NEGATIVE
Protein, UA: NEGATIVE
Spec Grav, UA: 1.01 (ref 1.010–1.025)
Urobilinogen, UA: NEGATIVE E.U./dL — AB
pH, UA: 6 (ref 5.0–8.0)

## 2019-07-14 MED ORDER — SODIUM BICARBONATE 8.4 % IV SOLN
50.0000 meq | Freq: Once | INTRAVENOUS | Status: AC
  Administered 2019-07-14: 50 meq via INTRAVENOUS

## 2019-07-14 MED ORDER — DIMETHYL SULFOXIDE 50 % IS SOLN
50.0000 mL | Freq: Once | INTRAVESICAL | Status: AC
  Administered 2019-07-14: 50 mL via URETHRAL

## 2019-07-14 MED ORDER — LIDOCAINE HCL 2 % IJ SOLN
10.0000 mL | Freq: Once | INTRAMUSCULAR | Status: AC
  Administered 2019-07-14: 200 mg

## 2019-07-14 MED ORDER — HYDROCORTISONE NA SUCCINATE PF 100 MG IJ SOLR
100.0000 mg | Freq: Once | INTRAMUSCULAR | Status: AC
  Administered 2019-07-14: 100 mg

## 2019-07-14 NOTE — Progress Notes (Signed)
Bladder Instillation  Due to  IC  patient is present today for a Bladder Instillation of DMSO. Patient was cleaned and prepped in a sterile fashion with betadine and lidocaine was instilled into the urethra.  A 18FR catheter was inserted, urine return was noted 51ml, urine was lt yellow in color.  160 ml was instilled into the bladder. The catheter was then removed. Patient tolerated well, no complications were noted Patient held in bladder for 20 minutes prior to procedure starting.   Preformed by: Bee Marchiano,lpn  Follow up/ Additional notes: 1 month

## 2019-07-15 ENCOUNTER — Encounter (HOSPITAL_COMMUNITY): Payer: Self-pay

## 2019-07-15 ENCOUNTER — Ambulatory Visit (HOSPITAL_COMMUNITY): Payer: Medicare Other

## 2019-07-15 ENCOUNTER — Other Ambulatory Visit: Payer: Self-pay

## 2019-07-15 DIAGNOSIS — R296 Repeated falls: Secondary | ICD-10-CM

## 2019-07-15 DIAGNOSIS — R2689 Other abnormalities of gait and mobility: Secondary | ICD-10-CM

## 2019-07-15 NOTE — Therapy (Signed)
[place], [place], [address] Phone: [phone]   Fax:  [phone]  Physical Therapy Treatment  Patient Details  Name: Deborah Gray MRN: [id number] Date of Birth: 1933/08/08 Referring Provider (PT): Baltazar Apo MD    Encounter Date: 07/15/2019  PT End of Session - 07/15/19 1452    Visit Number  7    Number of Visits  12    Date for PT Re-Evaluation  08/06/19    Authorization Type  BCBC Medicare; [place] state health secondary, [employer] tertiary; No auth req. no V.L.    Progress Note Due on Visit  10    PT Start Time  1450    PT Stop Time  1533    PT Time Calculation (min)  43 min    Equipment Utilized During Treatment  Gait belt    Activity Tolerance  Patient tolerated treatment well    Behavior During Therapy  WFL for tasks assessed/performed       Past Medical History:  Diagnosis Date  . Arthritis   . Benign neoplasm of colon   . Constipation   . Diaphragmatic hernia without mention of obstruction or gangrene   . Diverticulosis of colon (without mention of hemorrhage)   . Dysphagia, pharyngoesophageal phase   . Esophageal reflux   . Essential hypertension   . Heart murmur   . Hemorrhoids   . Hyperlipidemia   . Internal hemorrhoids without mention of complication   . Left wrist fracture   . PONV (postoperative nausea and vomiting)   . Stricture and stenosis of esophagus   . Type 2 diabetes mellitus ([place])   . Unspecified gastritis and gastroduodenitis without mention of hemorrhage     Past Surgical History:  Procedure Laterality Date  . ABDOMINAL HYSTERECTOMY    . BACTERIAL OVERGROWTH TEST N/A 09/06/2015   Procedure: BACTERIAL OVERGROWTH TEST;  Surgeon: Daneil Dolin, MD;  [facility];  Service: Endoscopy;  Laterality: N/A;  800  . BREAST LUMPECTOMY Right    benign  . CATARACT EXTRACTION Bilateral 2006   Implants in both, surgeries done 6 weeks apart  . CHOLECYSTECTOMY    .  COLONOSCOPY  03/08/02   [name]: diverticulosis, internal hemorrhoids, adenomatous colon polyp  . COLONOSCOPY  01/23/04   [name]: diverticulosis, internal hemorrhoids  . COLONOSCOPY  09/25/05   [name]: diverticulosis  . COLONOSCOPY  07/05/09   [name]: severe diverticulosis  . COLONOSCOPY  01/21/11   [name]: severe diverticulosis in sigmoid to desc colon, int hemorrhoids, follow up TCS in 5 years  . COLONOSCOPY N/A 04/10/2016   Procedure: COLONOSCOPY;  Surgeon: Daneil Dolin, MD;  [facility];  Service: Endoscopy;  Laterality: N/A;  12:30 PM  . DILATION AND CURETTAGE OF UTERUS    . ESOPHAGEAL MANOMETRY  03/21/08   Patterson: findings c/w Nutcracker Esophagus  . ESOPHAGOGASTRODUODENOSCOPY  03/08/02   [name]: esophageal stricture, chronic gerd, s/p dilation.   . ESOPHAGOGASTRODUODENOSCOPY  01/23/04   [name]: esophageal stricture, gastritis, hiatal hernia  . ESOPHAGOGASTRODUODENOSCOPY  09/25/05   Patterson: gastritis, benign bx, no H.pylori  . ESOPHAGOGASTRODUODENOSCOPY  07/05/09   [facility]terson: gastropathy, benign small bowel bx and gastric bx  . ESOPHAGOGASTRODUODENOSCOPY N/A 05/15/2015   Dr. Gala Romney- diffuse moderate inflammation characterized by congestion (edema), erythema, and linear erosions was found in the entire examined stomach. bx= reactive gastropathy  . FOOT SURGERY Left   . HEMORRHOID BANDING  2017   Dr.[name]  . LAPAROSCOPIC VAGINAL HYSTERECTOMY    .  ORIF WRIST FRACTURE Left 06/23/2018   Procedure: OPEN REDUCTION INTERNAL FIXATION (ORIF) LEFT WRIST FRACTURE;  Surgeon: Renette Butters, MD;  Location: [facility];  Service: Orthopedics;  Laterality: Left;  regional arm block  . RECTOCELE REPAIR    . TOTAL KNEE ARTHROPLASTY Right     There were no vitals filed for this visit.  Subjective Assessment - 07/15/19 1453    Subjective  Pt stated she has been busy today, felt rushed to make it to therapy.    Pertinent History  RT TKA in 2010; Hx of  vertigo    Patient Stated Goals  Be more steady on my feet, I'd like ot be a little straighter (posture)    Currently in Pain?  No/denies                        [facility] Adult PT Treatment/Exercise - 07/15/19 0001      Posture/Postural Control   Posture/Postural Control  Postural limitations    Postural Limitations  Increased thoracic kyphosis;Weight shift right;Forward head;Rounded Shoulders      Exercises   Exercises  Knee/Hip      Knee/Hip Exercises: Standing   Heel Raises  Both;2 sets;10 reps    Heel Raises Limitations  incline slope    Hip Abduction  Both;1 set;15 reps;Knee straight    Hip Extension  Both;1 set;15 reps;Knee straight    Lateral Step Up  Both;1 set;10 reps;Hand Hold: 1;Step Height: 4"    Functional Squat  2 sets;5 reps    Functional Squat Limitations  multimodal cueing for mechanics; front of chair    Stairs  1RT reciprocal pattern with 1 HR; c/o Lt knee pain descending stairs    Other Standing Knee Exercises  RTB row and shoulder extension 15x each    Other Standing Knee Exercises  NBOS on foam paloff GTB palloff during staggered tanderm stance 2x 5      Knee/Hip Exercises: Seated   Other Seated Knee/Hip Exercises  Seated posture awareness    Sit to Sand  1 set;10 reps;without UE support               PT Short Term Goals - 06/21/19 1751      PT SHORT TERM GOAL #1   Title  Patient will be independent with initial HEP and self-management strategies to improve functional outcomes    Time  3    Period  Weeks    Status  New    Target Date  07/16/19        PT Long Term Goals - 06/21/19 1751      PT LONG TERM GOAL #1   Title  Patient will be able to perform stand x 5 in < 15 seconds to demonstrate improvement in functional mobility and reduced risk for falls.    Time  6    Period  Weeks    Status  New    Target Date  08/06/19      PT LONG TERM GOAL #2   Title  Patient will report at least 75% overall improvement in subjective  complaint to indicate improvement in ability to perform ADLs.    Time  6    Period  Weeks    Status  New    Target Date  08/06/19      PT LONG TERM GOAL #3   Title  Patient will increase BERG score by at least 5 points to demonstrate improvement in  functional mobility and reduced risk for falls.    Time  6    Period  Weeks    Status  New    Target Date  08/06/19      PT LONG TERM GOAL #4   Title  Pateitn will report no falls since starting therapy to demonstrate improvement in functional mobility and reduced risk for falls    Time  6    Period  Weeks    Status  New    Target Date  08/06/19            Plan - 07/15/19 1540    Clinical Impression Statement  Progressed core/proximal strengthening and balance training this session.  Pt educated on importance of proper posture and to look up to assist with static NBOS balance activities.  Added paloff exercises to POC with cueing for core activation.  Also added squats to program with multimodal cueing for mechanics, will need further educated on mechanics in sessions to follow.  Pt tolerated well towards session.  Did report Lt knee pain descending stairs, pain resolved following activity.    Personal Factors and Comorbidities  Age;Past/Current Experience    Examination-Activity Limitations  Bathing;Stand;Lift;Stairs;Dressing;Squat;Carry;Bend;Transfers;Locomotion Level    Examination-Participation Restrictions  Yard Work;Church;Cleaning;Community Activity;Laundry;Volunteer    Stability/Clinical Decision Making  Stable/Uncomplicated    Clinical Decision Making  Low    Rehab Potential  Good    PT Frequency  2x / week    PT Duration  6 weeks    PT Treatment/Interventions  ADLs/Self Care Home Management;Aquatic Therapy;Biofeedback;Cryotherapy;Electrical Stimulation;Contrast Bath;Therapeutic exercise;Orthotic Fit/Training;Compression bandaging;Manual lymph drainage;Patient/family education;Therapeutic activities;Functional mobility  training;Cognitive remediation;Manual techniques;Wheelchair mobility training;Passive range of motion;Dry needling;Balance training;Neuromuscular re-education;Gait training;DME Instruction;Iontophoresis 4mg /ml Dexamethasone;Stair training;Moist Heat;Traction;Ultrasound;Parrafin;Fluidtherapy;Taping;Vasopneumatic Device;Spinal Manipulations;Joint Manipulations;Energy conservation;Splinting;Visual/perceptual remediation/compensation    PT Next Visit Plan  Progress as tolerated with focus on balance, posture and funcitonal strengthening.  Begin lunges on step next session.    PT Home Exercise Plan  06/29/19: sit to stand, heel raise at sink, and staggered standing at sink       Patient will benefit from skilled therapeutic intervention in order to improve the following deficits and impairments:  Abnormal gait, Postural dysfunction, Decreased activity tolerance, Decreased endurance, Decreased strength, Decreased balance, Difficulty walking, Hypomobility  Visit Diagnosis: Repeated falls  Other abnormalities of gait and mobility     Problem List Patient Active Problem List   Diagnosis Date Noted  . SVT (supraventricular tachycardia) ([place]) 12/23/2018  . Bloating 08/30/2015  . Exertional chest pain 08/16/2015  . Chest pain 08/16/2015  . Mucosal abnormality of stomach   . Hemorrhoids 04/18/2015  . IBS (irritable bowel syndrome) 01/30/2015  . Type 2 diabetes mellitus ([place]) 12/22/2014  . Hyperlipidemia 12/22/2014  . Prolapse of female pelvic organs 11/28/2013  . Precordial pain 11/25/2013  . Essential hypertension 11/25/2013  . Heart murmur 11/25/2013  . Osteopenia 10/23/2012  . Chronic pancreatitis ([place]) 02/26/2011  . GERD (gastroesophageal reflux disease) 01/18/2011  . Constipation 11/29/2010  . Esophageal spasm 11/29/2010  . DJD (degenerative joint disease) 11/29/2010  . Hx of adenomatous colonic polyps 06/29/2009  . ESOPHAGEAL MOTILITY DISORDER 04/01/2008  . DM 03/01/2008  . Prolapsed  hemorrhoids 07/02/2007  . Diverticulosis of large intestine 07/02/2007   Ihor Austin, LPTA/CLT; CBIS [phone]  Aldona Lento 07/15/2019, 3:44 PM  [place], [place], [address] Phone: [phone]   Fax:  [phone]  Name: KATELIND MICHELE MRN: [id number] Date of Birth: 02-Nov-1933

## 2019-07-16 ENCOUNTER — Other Ambulatory Visit (HOSPITAL_COMMUNITY): Payer: Medicare Other

## 2019-07-16 ENCOUNTER — Encounter (HOSPITAL_COMMUNITY): Payer: Medicare Other

## 2019-07-20 ENCOUNTER — Ambulatory Visit (HOSPITAL_COMMUNITY): Payer: Medicare Other | Attending: Family Medicine

## 2019-07-20 ENCOUNTER — Other Ambulatory Visit: Payer: Self-pay

## 2019-07-20 ENCOUNTER — Encounter (HOSPITAL_COMMUNITY): Payer: Self-pay

## 2019-07-20 DIAGNOSIS — R2689 Other abnormalities of gait and mobility: Secondary | ICD-10-CM | POA: Insufficient documentation

## 2019-07-20 DIAGNOSIS — R296 Repeated falls: Secondary | ICD-10-CM | POA: Diagnosis not present

## 2019-07-20 NOTE — Therapy (Signed)
[place], [place], [address] Phone: [phone]   Fax:  [phone]  Physical Therapy Treatment  Patient Details  Name: Deborah Gray MRN: [id number] Date of Birth: [birth year] Referring Provider (PT): Baltazar Apo MD    Encounter Date: 07/20/2019  PT End of Session - 07/20/19 1537    Visit Number  8    Number of Visits  12    Date for PT Re-Evaluation  08/06/19    Authorization Type  BCBC Medicare; [place] state health secondary, [employer] tertiary; No auth req. no V.L.    Progress Note Due on Visit  10    PT Start Time  1533    PT Stop Time  1612    PT Time Calculation (min)  39 min    Equipment Utilized During Treatment  Gait belt    Activity Tolerance  Patient tolerated treatment well    Behavior During Therapy  WFL for tasks assessed/performed       Past Medical History:  Diagnosis Date  . Arthritis   . Benign neoplasm of colon   . Constipation   . Diaphragmatic hernia without mention of obstruction or gangrene   . Diverticulosis of colon (without mention of hemorrhage)   . Dysphagia, pharyngoesophageal phase   . Esophageal reflux   . Essential hypertension   . Heart murmur   . Hemorrhoids   . Hyperlipidemia   . Internal hemorrhoids without mention of complication   . Left wrist fracture   . PONV (postoperative nausea and vomiting)   . Stricture and stenosis of esophagus   . Type 2 diabetes mellitus ([place])   . Unspecified gastritis and gastroduodenitis without mention of hemorrhage     Past Surgical History:  Procedure Laterality Date  . ABDOMINAL HYSTERECTOMY    . BACTERIAL OVERGROWTH TEST N/A 09/06/2015   Procedure: BACTERIAL OVERGROWTH TEST;  Surgeon: Daneil Dolin, MD;  [facility];  Service: Endoscopy;  Laterality: N/A;  800  . BREAST LUMPECTOMY Right    benign  . CATARACT EXTRACTION Bilateral 2006   Implants in both, surgeries done 6 weeks apart  . CHOLECYSTECTOMY    .  COLONOSCOPY  03/08/02   [name]: diverticulosis, internal hemorrhoids, adenomatous colon polyp  . COLONOSCOPY  01/23/04   [name]: diverticulosis, internal hemorrhoids  . COLONOSCOPY  09/25/05   [name]: diverticulosis  . COLONOSCOPY  07/05/09   [name]: severe diverticulosis  . COLONOSCOPY  01/21/11   [name]: severe diverticulosis in sigmoid to desc colon, int hemorrhoids, follow up TCS in 5 years  . COLONOSCOPY N/A 04/10/2016   Procedure: COLONOSCOPY;  Surgeon: Daneil Dolin, MD;  [facility];  Service: Endoscopy;  Laterality: N/A;  12:30 PM  . DILATION AND CURETTAGE OF UTERUS    . ESOPHAGEAL MANOMETRY  03/21/08   Patterson: findings c/w Nutcracker Esophagus  . ESOPHAGOGASTRODUODENOSCOPY  03/08/02   [name]: esophageal stricture, chronic gerd, s/p dilation.   . ESOPHAGOGASTRODUODENOSCOPY  01/23/04   [name]: esophageal stricture, gastritis, hiatal hernia  . ESOPHAGOGASTRODUODENOSCOPY  09/25/05   Patterson: gastritis, benign bx, no H.pylori  . ESOPHAGOGASTRODUODENOSCOPY  07/05/09   Patterson: gastropathy, benign small bowel bx and gastric bx  . ESOPHAGOGASTRODUODENOSCOPY N/A 05/15/2015   Dr. Gala Romney- diffuse moderate inflammation characterized by congestion (edema), erythema, and linear erosions was found in the entire examined stomach. bx= reactive gastropathy  . FOOT SURGERY Left   . HEMORRHOID BANDING  2017   Dr.[name]  . LAPAROSCOPIC VAGINAL HYSTERECTOMY    .  ORIF WRIST FRACTURE Left 06/23/2018   Procedure: OPEN REDUCTION INTERNAL FIXATION (ORIF) LEFT WRIST FRACTURE;  Surgeon: Renette Butters, MD;  Location: [facility];  Service: Orthopedics;  Laterality: Left;  regional arm block  . RECTOCELE REPAIR    . TOTAL KNEE ARTHROPLASTY Right     There were no vitals filed for this visit.  Subjective Assessment - 07/20/19 1536    Subjective  Pt stated she is tired today, had to get up early for the bug guy to arrive at home.  No reports of pain currently.     Pertinent History  RT TKA in 2010; Hx of vertigo    Patient Stated Goals  Be more steady on my feet, I'd like ot be a little straighter (posture)    Currently in Pain?  No/denies                        [facility] Adult PT Treatment/Exercise - 07/20/19 0001      Posture/Postural Control   Posture/Postural Control  Postural limitations    Postural Limitations  Increased thoracic kyphosis;Weight shift right;Forward head;Rounded Shoulders      Exercises   Exercises  Knee/Hip      Knee/Hip Exercises: Standing   Heel Raises  Both;2 sets;10 reps    Heel Raises Limitations  incline slope    Forward Lunges  Both;10 reps    Forward Lunges Limitations  6in step no HHA    Functional Squat  5 reps    Functional Squat Limitations  multimodal cueing for mechanics; front of chair    Stairs  3RT reciprocal pattern wiht 1 HR; ascend 7in and descend 4in; no c/o pain    Other Standing Knee Exercises  RTB row and shoulder extension 15x each    Other Standing Knee Exercises  NBOS on foam paloff GTB palloff during staggered tanderm stance 2x 5      Knee/Hip Exercises: Seated   Sit to Sand  1 set;10 reps;without UE support   eccentric control         Balance Exercises - 07/20/19 1549      Balance Exercises: Standing   Standing Eyes Opened  Narrow base of support (BOS);Head turns   10x   Tandem Stance  Eyes open;Foam/compliant surface   head turns         PT Short Term Goals - 06/21/19 1751      PT SHORT TERM GOAL #1   Title  Patient will be independent with initial HEP and self-management strategies to improve functional outcomes    Time  3    Period  Weeks    Status  New    Target Date  07/16/19        PT Long Term Goals - 06/21/19 1751      PT LONG TERM GOAL #1   Title  Patient will be able to perform stand x 5 in < 15 seconds to demonstrate improvement in functional mobility and reduced risk for falls.    Time  6    Period  Weeks    Status  New    Target  Date  08/06/19      PT LONG TERM GOAL #2   Title  Patient will report at least 75% overall improvement in subjective complaint to indicate improvement in ability to perform ADLs.    Time  6    Period  Weeks    Status  New  Target Date  08/06/19      PT LONG TERM GOAL #3   Title  Patient will increase BERG score by at least 5 points to demonstrate improvement in functional mobility and reduced risk for falls.    Time  6    Period  Weeks    Status  New    Target Date  08/06/19      PT LONG TERM GOAL #4   Title  Pateitn will report no falls since starting therapy to demonstrate improvement in functional mobility and reduced risk for falls    Time  6    Period  Weeks    Status  New    Target Date  08/06/19            Plan - 07/20/19 1555    Clinical Impression Statement  Continued with core/proximal strengthening and balance training activities.  Progressed balance activities with static standing on dynamic surfaces with head turns that was challenging for pt.   Required min A for safety and cueing to improve awareness of posture.  Added forward lunges for functional strengthening wihtout HHA for core stability, min HHA required for LOB.  Pt able to complete reciprocal pattern 7in ascending and 4in descending wiht no c/o pain, noted.  Does present wiht some difficulty wiht eccentric control wiht STS, due to c/o Lt knee tightness and gluteal weakness,    Personal Factors and Comorbidities  Age;Past/Current Experience    Examination-Activity Limitations  Bathing;Stand;Lift;Stairs;Dressing;Squat;Carry;Bend;Transfers;Locomotion Level    Examination-Participation Restrictions  Yard Work;Church;Cleaning;Community Activity;Laundry;Volunteer    Stability/Clinical Decision Making  Stable/Uncomplicated    Clinical Decision Making  Low    Rehab Potential  Good    PT Frequency  2x / week    PT Duration  6 weeks    PT Treatment/Interventions  ADLs/Self Care Home Management;Aquatic  Therapy;Biofeedback;Cryotherapy;Electrical Stimulation;Contrast Bath;Therapeutic exercise;Orthotic Fit/Training;Compression bandaging;Manual lymph drainage;Patient/family education;Therapeutic activities;Functional mobility training;Cognitive remediation;Manual techniques;Wheelchair mobility training;Passive range of motion;Dry needling;Balance training;Neuromuscular re-education;Gait training;DME Instruction;Iontophoresis 4mg /ml Dexamethasone;Stair training;Moist Heat;Traction;Ultrasound;Parrafin;Fluidtherapy;Taping;Vasopneumatic Device;Spinal Manipulations;Joint Manipulations;Energy conservation;Splinting;Visual/perceptual remediation/compensation    PT Next Visit Plan  Progress as tolerated with focus on balance, posture and funcitonal strengthening.  Add wall arch for posture next session.    PT Home Exercise Plan  06/29/19: sit to stand, heel raise at sink, and staggered standing at sink       Patient will benefit from skilled therapeutic intervention in order to improve the following deficits and impairments:  Abnormal gait, Postural dysfunction, Decreased activity tolerance, Decreased endurance, Decreased strength, Decreased balance, Difficulty walking, Hypomobility  Visit Diagnosis: Repeated falls  Other abnormalities of gait and mobility     Problem List Patient Active Problem List   Diagnosis Date Noted  . SVT (supraventricular tachycardia) ([place]) 12/23/2018  . Bloating 08/30/2015  . Exertional chest pain 08/16/2015  . Chest pain 08/16/2015  . Mucosal abnormality of stomach   . Hemorrhoids 04/18/2015  . IBS (irritable bowel syndrome) 01/30/2015  . Type 2 diabetes mellitus ([place]) 12/22/2014  . Hyperlipidemia 12/22/2014  . Prolapse of female pelvic organs 11/28/2013  . Precordial pain 11/25/2013  . Essential hypertension 11/25/2013  . Heart murmur 11/25/2013  . Osteopenia 10/23/2012  . Chronic pancreatitis ([place]) 02/26/2011  . GERD (gastroesophageal reflux disease) 01/18/2011   . Constipation 11/29/2010  . Esophageal spasm 11/29/2010  . DJD (degenerative joint disease) 11/29/2010  . Hx of adenomatous colonic polyps 06/29/2009  . ESOPHAGEAL MOTILITY DISORDER 04/01/2008  . DM 03/01/2008  . Prolapsed hemorrhoids 07/02/2007  . Diverticulosis of large  intestine 07/02/2007   Ihor Austin, LPTA/CLT; CBIS [phone]  Aldona Lento 07/20/2019, 6:43 PM  [place] [address] [place], [place], [address] Phone: [phone]   Fax:  [phone]  Name: Deborah Gray MRN: [id number] Date of Birth: 01-01-1934

## 2019-07-21 DIAGNOSIS — S233XXA Sprain of ligaments of thoracic spine, initial encounter: Secondary | ICD-10-CM | POA: Diagnosis not present

## 2019-07-21 DIAGNOSIS — S338XXA Sprain of other parts of lumbar spine and pelvis, initial encounter: Secondary | ICD-10-CM | POA: Diagnosis not present

## 2019-07-21 DIAGNOSIS — S134XXA Sprain of ligaments of cervical spine, initial encounter: Secondary | ICD-10-CM | POA: Diagnosis not present

## 2019-07-22 ENCOUNTER — Ambulatory Visit (HOSPITAL_COMMUNITY): Payer: Medicare Other

## 2019-07-22 ENCOUNTER — Other Ambulatory Visit: Payer: Self-pay

## 2019-07-22 ENCOUNTER — Encounter (HOSPITAL_COMMUNITY): Payer: Self-pay

## 2019-07-22 DIAGNOSIS — R2689 Other abnormalities of gait and mobility: Secondary | ICD-10-CM | POA: Diagnosis not present

## 2019-07-22 DIAGNOSIS — R296 Repeated falls: Secondary | ICD-10-CM

## 2019-07-22 NOTE — Therapy (Signed)
[place], [place], [address] Phone: [phone]   Fax:  [phone]  Physical Therapy Treatment  Patient Details  Name: Deborah Gray MRN: [id number] Date of Birth: Jul 24, 1933 Referring Provider (PT): Baltazar Apo MD    Encounter Date: 07/22/2019  PT End of Session - 07/22/19 1630    Visit Number  9    Number of Visits  12    Date for PT Re-Evaluation  08/06/19    Authorization Type  BCBC Medicare; [place] [employer] secondary, [employer] tertiary; No auth req. no V.L.    Progress Note Due on Visit  10    PT Start Time  1622    PT Stop Time  1704    PT Time Calculation (min)  42 min    Equipment Utilized During Treatment  Gait belt    Activity Tolerance  Patient tolerated treatment well    Behavior During Therapy  WFL for tasks assessed/performed       Past Medical History:  Diagnosis Date  . Arthritis   . Benign neoplasm of colon   . Constipation   . Diaphragmatic hernia without mention of obstruction or gangrene   . Diverticulosis of colon (without mention of hemorrhage)   . Dysphagia, pharyngoesophageal phase   . Esophageal reflux   . Essential hypertension   . Heart murmur   . Hemorrhoids   . Hyperlipidemia   . Internal hemorrhoids without mention of complication   . Left wrist fracture   . PONV (postoperative nausea and vomiting)   . Stricture and stenosis of esophagus   . Type 2 diabetes mellitus ([place])   . Unspecified gastritis and gastroduodenitis without mention of hemorrhage     Past Surgical History:  Procedure Laterality Date  . ABDOMINAL HYSTERECTOMY    . BACTERIAL OVERGROWTH TEST N/A 09/06/2015   Procedure: BACTERIAL OVERGROWTH TEST;  Surgeon: Daneil Dolin, MD;  [facility];  Service: Endoscopy;  Laterality: N/A;  800  . BREAST LUMPECTOMY Right    benign  . CATARACT EXTRACTION Bilateral 2006   Implants in both, surgeries done 6 weeks apart  . CHOLECYSTECTOMY    .  COLONOSCOPY  03/08/02   [name]: diverticulosis, internal hemorrhoids, adenomatous colon polyp  . COLONOSCOPY  01/23/04   [name]: diverticulosis, internal hemorrhoids  . COLONOSCOPY  09/25/05   [name]: diverticulosis  . COLONOSCOPY  07/05/09   [name]: severe diverticulosis  . COLONOSCOPY  01/21/11   [name]: severe diverticulosis in sigmoid to desc colon, int hemorrhoids, follow up TCS in 5 years  . COLONOSCOPY N/A 04/10/2016   Procedure: COLONOSCOPY;  Surgeon: Daneil Dolin, MD;  [facility];  Service: Endoscopy;  Laterality: N/A;  12:30 PM  . DILATION AND CURETTAGE OF UTERUS    . ESOPHAGEAL MANOMETRY  03/21/08   Patterson: findings c/w Nutcracker Esophagus  . ESOPHAGOGASTRODUODENOSCOPY  03/08/02   [name]: esophageal stricture, chronic gerd, s/p dilation.   . ESOPHAGOGASTRODUODENOSCOPY  01/23/04   [name]: esophageal stricture, gastritis, hiatal hernia  . ESOPHAGOGASTRODUODENOSCOPY  09/25/05   [facility]terson: gastritis, benign bx, no H.pylori  . ESOPHAGOGASTRODUODENOSCOPY  07/05/09   [facility]terson: gastropathy, benign small bowel bx and gastric bx  . ESOPHAGOGASTRODUODENOSCOPY N/A 05/15/2015   Dr. Gala Romney- diffuse moderate inflammation characterized by congestion (edema), erythema, and linear erosions was found in the entire examined stomach. bx= reactive gastropathy  . FOOT SURGERY Left   . HEMORRHOID BANDING  2017   Dr.[name]  . LAPAROSCOPIC VAGINAL HYSTERECTOMY    .  ORIF WRIST FRACTURE Left 06/23/2018   Procedure: OPEN REDUCTION INTERNAL FIXATION (ORIF) LEFT WRIST FRACTURE;  Surgeon: Renette Butters, MD;  Location: [facility];  Service: Orthopedics;  Laterality: Left;  regional arm block  . RECTOCELE REPAIR    . TOTAL KNEE ARTHROPLASTY Right     There were no vitals filed for this visit.  Subjective Assessment - 07/22/19 1626    Subjective  Pt stated her Lt knee is bothering her a little bit today.    Pertinent History  RT TKA in 2010; Hx of vertigo     Patient Stated Goals  Be more steady on my feet, I'd like ot be a little straighter (posture)    Currently in Pain?  Yes    Pain Score  3     Pain Location  Knee    Pain Orientation  Left    Pain Descriptors / Indicators  Aching   grabbing   Pain Type  Chronic pain    Pain Onset  More than a month ago    Pain Frequency  Intermittent    Aggravating Factors   weight bearing    Pain Relieving Factors  nothing much, advil    Effect of Pain on Daily Activities  limits                        [facility] Adult PT Treatment/Exercise - 07/22/19 0001      Posture/Postural Control   Posture/Postural Control  Postural limitations    Postural Limitations  Increased thoracic kyphosis;Weight shift right;Forward head;Rounded Shoulders      Exercises   Exercises  Knee/Hip      Knee/Hip Exercises: Standing   Heel Raises  2 sets;5 reps    Heel Raises Limitations  wall arch with heel raise    Functional Squat  5 reps    Functional Squat Limitations  multimodal cueing for mechanics; front of chair    Other Standing Knee Exercises  RTB row and shoulder extension 15x each    Other Standing Knee Exercises  wall arch 5x      Knee/Hip Exercises: Seated   Sit to Sand  1 set;10 reps;without UE support          Balance Exercises - 07/22/19 1632      Balance Exercises: Standing   Tandem Stance  Eyes open;Foam/compliant surface    SLS  5 reps   Rt 3"; Lt 6"   Tandem Gait  2 reps    Sidestepping  2 reps          PT Short Term Goals - 06/21/19 1751      PT SHORT TERM GOAL #1   Title  Patient will be independent with initial HEP and self-management strategies to improve functional outcomes    Time  3    Period  Weeks    Status  New    Target Date  07/16/19        PT Long Term Goals - 06/21/19 1751      PT LONG TERM GOAL #1   Title  Patient will be able to perform stand x 5 in < 15 seconds to demonstrate improvement in functional mobility and reduced risk for falls.     Time  6    Period  Weeks    Status  New    Target Date  08/06/19      PT LONG TERM GOAL #2   Title  Patient will  report at least 75% overall improvement in subjective complaint to indicate improvement in ability to perform ADLs.    Time  6    Period  Weeks    Status  New    Target Date  08/06/19      PT LONG TERM GOAL #3   Title  Patient will increase BERG score by at least 5 points to demonstrate improvement in functional mobility and reduced risk for falls.    Time  6    Period  Weeks    Status  New    Target Date  08/06/19      PT LONG TERM GOAL #4   Title  Pateitn will report no falls since starting therapy to demonstrate improvement in functional mobility and reduced risk for falls    Time  6    Period  Weeks    Status  New    Target Date  08/06/19            Plan - 07/22/19 1713    Clinical Impression Statement  Progressed balance this session with additional SLS and tandem gait.  Both of these challenging to pt required min A for LOB episodes for safety.  Added wall arch for posture, paired with heel raise for balance training.  No reoprts of increased pain through session.    Personal Factors and Comorbidities  Age;Past/Current Experience    Examination-Activity Limitations  Bathing;Stand;Lift;Stairs;Dressing;Squat;Carry;Bend;Transfers;Locomotion Level    Examination-Participation Restrictions  Yard Work;Church;Cleaning;Community Activity;Laundry;Volunteer    Stability/Clinical Decision Making  Stable/Uncomplicated    Clinical Decision Making  Low    Rehab Potential  Good    PT Frequency  2x / week    PT Duration  6 weeks    PT Treatment/Interventions  ADLs/Self Care Home Management;Aquatic Therapy;Biofeedback;Cryotherapy;Electrical Stimulation;Contrast Bath;Therapeutic exercise;Orthotic Fit/Training;Compression bandaging;Manual lymph drainage;Patient/family education;Therapeutic activities;Functional mobility training;Cognitive remediation;Manual  techniques;Wheelchair mobility training;Passive range of motion;Dry needling;Balance training;Neuromuscular re-education;Gait training;DME Instruction;Iontophoresis 4mg /ml Dexamethasone;Stair training;Moist Heat;Traction;Ultrasound;Parrafin;Fluidtherapy;Taping;Vasopneumatic Device;Spinal Manipulations;Joint Manipulations;Energy conservation;Splinting;Visual/perceptual remediation/compensation    PT Next Visit Plan  Progress as tolerated with focus on balance, posture and funcitonal strengthening.    PT Home Exercise Plan  06/29/19: sit to stand, heel raise at sink, and staggered standing at sink; 07/22/19: SLS       Patient will benefit from skilled therapeutic intervention in order to improve the following deficits and impairments:  Abnormal gait, Postural dysfunction, Decreased activity tolerance, Decreased endurance, Decreased strength, Decreased balance, Difficulty walking, Hypomobility  Visit Diagnosis: Repeated falls  Other abnormalities of gait and mobility     Problem List Patient Active Problem List   Diagnosis Date Noted  . SVT (supraventricular tachycardia) ([place]) 12/23/2018  . Bloating 08/30/2015  . Exertional chest pain 08/16/2015  . Chest pain 08/16/2015  . Mucosal abnormality of stomach   . Hemorrhoids 04/18/2015  . IBS (irritable bowel syndrome) 01/30/2015  . Type 2 diabetes mellitus ([place]) 12/22/2014  . Hyperlipidemia 12/22/2014  . Prolapse of female pelvic organs 11/28/2013  . Precordial pain 11/25/2013  . Essential hypertension 11/25/2013  . Heart murmur 11/25/2013  . Osteopenia 10/23/2012  . Chronic pancreatitis ([place]) 02/26/2011  . GERD (gastroesophageal reflux disease) 01/18/2011  . Constipation 11/29/2010  . Esophageal spasm 11/29/2010  . DJD (degenerative joint disease) 11/29/2010  . Hx of adenomatous colonic polyps 06/29/2009  . ESOPHAGEAL MOTILITY DISORDER 04/01/2008  . DM 03/01/2008  . Prolapsed hemorrhoids 07/02/2007  . Diverticulosis of large intestine  07/02/2007   Ihor Austin, LPTA/CLT; CBIS [phone] Aldona Lento 07/22/2019, 5:18 PM  [place], [place], [address] Phone: [phone]   Fax:  [phone]  Name: Deborah Gray MRN: [id number] Date of Birth: 14-Jul-1933

## 2019-07-26 DIAGNOSIS — H34832 Tributary (branch) retinal vein occlusion, left eye, with macular edema: Secondary | ICD-10-CM | POA: Diagnosis not present

## 2019-07-27 ENCOUNTER — Encounter (HOSPITAL_COMMUNITY): Payer: Self-pay | Admitting: Physical Therapy

## 2019-07-27 ENCOUNTER — Other Ambulatory Visit: Payer: Self-pay

## 2019-07-27 ENCOUNTER — Ambulatory Visit (HOSPITAL_COMMUNITY): Payer: Medicare Other | Admitting: Physical Therapy

## 2019-07-27 DIAGNOSIS — R2689 Other abnormalities of gait and mobility: Secondary | ICD-10-CM

## 2019-07-27 DIAGNOSIS — R296 Repeated falls: Secondary | ICD-10-CM | POA: Diagnosis not present

## 2019-07-27 NOTE — Therapy (Signed)
[place], [place], [address] Phone: [phone]   Fax:  [phone]  Physical Therapy Treatment/ Progress Note  Patient Details  Name: Deborah Gray MRN: [id number] Date of Birth: [birth year] Referring Provider (PT): Baltazar Apo MD    Encounter Date: 07/27/2019   Progress Note Reporting Period 06/21/19 to 07/27/19  See note below for Objective Data and Assessment of Progress/Goals.       PT End of Session - 07/27/19 1655    Visit Number  10    Number of Visits  12    Date for PT Re-Evaluation  08/06/19    Authorization Type  BCBC Medicare; [place] state health secondary, [employer] tertiary; No auth req. no V.L.    Progress Note Due on Visit  10    PT Start Time  1646    PT Stop Time  1730    PT Time Calculation (min)  44 min    Equipment Utilized During Treatment  Gait belt    Activity Tolerance  Patient tolerated treatment well    Behavior During Therapy  WFL for tasks assessed/performed       Past Medical History:  Diagnosis Date  . Arthritis   . Benign neoplasm of colon   . Constipation   . Diaphragmatic hernia without mention of obstruction or gangrene   . Diverticulosis of colon (without mention of hemorrhage)   . Dysphagia, pharyngoesophageal phase   . Esophageal reflux   . Essential hypertension   . Heart murmur   . Hemorrhoids   . Hyperlipidemia   . Internal hemorrhoids without mention of complication   . Left wrist fracture   . PONV (postoperative nausea and vomiting)   . Stricture and stenosis of esophagus   . Type 2 diabetes mellitus ([place])   . Unspecified gastritis and gastroduodenitis without mention of hemorrhage     Past Surgical History:  Procedure Laterality Date  . ABDOMINAL HYSTERECTOMY    . BACTERIAL OVERGROWTH TEST N/A 09/06/2015   Procedure: BACTERIAL OVERGROWTH TEST;  Surgeon: Daneil Dolin, MD;  [facility];  Service: Endoscopy;  Laterality: N/A;  800  .  BREAST LUMPECTOMY Right    benign  . CATARACT EXTRACTION Bilateral 2006   Implants in both, surgeries done 6 weeks apart  . CHOLECYSTECTOMY    . COLONOSCOPY  03/08/02   [name]: diverticulosis, internal hemorrhoids, adenomatous colon polyp  . COLONOSCOPY  01/23/04   [name]: diverticulosis, internal hemorrhoids  . COLONOSCOPY  09/25/05   [name]: diverticulosis  . COLONOSCOPY  07/05/09   [name]: severe diverticulosis  . COLONOSCOPY  01/21/11   [name]: severe diverticulosis in sigmoid to desc colon, int hemorrhoids, follow up TCS in 5 years  . COLONOSCOPY N/A 04/10/2016   Procedure: COLONOSCOPY;  Surgeon: Daneil Dolin, MD;  [facility];  Service: Endoscopy;  Laterality: N/A;  12:30 PM  . DILATION AND CURETTAGE OF UTERUS    . ESOPHAGEAL MANOMETRY  03/21/08   Patterson: findings c/w Nutcracker Esophagus  . ESOPHAGOGASTRODUODENOSCOPY  03/08/02   [name]: esophageal stricture, chronic gerd, s/p dilation.   . ESOPHAGOGASTRODUODENOSCOPY  01/23/04   [name]: esophageal stricture, gastritis, hiatal hernia  . ESOPHAGOGASTRODUODENOSCOPY  09/25/05   Patterson: gastritis, benign bx, no H.pylori  . ESOPHAGOGASTRODUODENOSCOPY  07/05/09   Patterson: gastropathy, benign small bowel bx and gastric bx  . ESOPHAGOGASTRODUODENOSCOPY N/A 05/15/2015   Dr. Gala Romney- diffuse moderate inflammation characterized by congestion (edema), erythema, and linear erosions was found in  the entire examined stomach. bx= reactive gastropathy  . FOOT SURGERY Left   . HEMORRHOID BANDING  2017   Dr.[name]  . LAPAROSCOPIC VAGINAL HYSTERECTOMY    . ORIF WRIST FRACTURE Left 06/23/2018   Procedure: OPEN REDUCTION INTERNAL FIXATION (ORIF) LEFT WRIST FRACTURE;  Surgeon: Renette Butters, MD;  Location: [facility];  Service: Orthopedics;  Laterality: Left;  regional arm block  . RECTOCELE REPAIR    . TOTAL KNEE ARTHROPLASTY Right     There were no vitals filed for this visit.  Subjective Assessment  - 07/27/19 1651    Subjective  Patient says she thinks in some ways she is better, and other ways she is not. She says she feels the postural things have helped and made her more aware. Says she is still very challenged with the balance activities. Patient states she feels more confident with her balance and says she feels about 50% improvement since starting therapy.    Pertinent History  RT TKA in 2010; Hx of vertigo    Limitations  Standing;Walking;Lifting;House hold activities    Patient Stated Goals  Be more steady on my feet, I'd like ot be a little straighter (posture)    Currently in Pain?  No/denies    Pain Onset  More than a month ago         [facility] PT Assessment - 07/27/19 0001      Assessment   Medical Diagnosis  Gait Instability    Referring Provider (PT)  Baltazar Apo MD     Prior Therapy  Had for RT TKA 2010      Precautions   Precautions  Fall      Restrictions   Weight Bearing Restrictions  No      Balance Screen   Has the patient fallen in the past 6 months  Yes    How many times?  1    Has the patient had a decrease in activity level because of a fear of falling?   No    Is the patient reluctant to leave their home because of a fear of falling?   No      Home Environment   Living Environment  Private residence    Living Arrangements  Alone      Prior Function   Level of [place] with basic ADLs      Cognition   Overall Cognitive Status  Within Functional Limits for tasks assessed      Posture/Postural Control   Posture/Postural Control  Postural limitations    Postural Limitations  Increased thoracic kyphosis;Weight shift right;Forward head;Rounded Shoulders      Strength   Right Hip Flexion  5/5   was 4+   Left Hip Flexion  5/5   was 4+   Right Knee Flexion  5/5    Right Knee Extension  5/5    Left Knee Flexion  5/5    Left Knee Extension  5/5    Right Ankle Dorsiflexion  5/5    Left Ankle Dorsiflexion  5/5      Transfers    Five time sit to stand comments   18 sec with no UEs   was 28 sec      Berg Balance Test   Sit to Stand  Able to stand without using hands and stabilize independently    Standing Unsupported  Able to stand safely 2 minutes    Sitting with Back Unsupported but Feet Supported on Floor or  Stool  Able to sit safely and securely 2 minutes    Stand to Sit  Sits safely with minimal use of hands    Transfers  Able to transfer safely, minor use of hands    Standing Unsupported with Eyes Closed  Able to stand 10 seconds safely    Standing Unsupported with Feet Together  Able to place feet together independently and stand 1 minute safely    From Standing, Reach Forward with Outstretched Arm  Can reach forward >12 cm safely (5")    From Standing Position, Pick up Object from [place] to pick up shoe safely and easily    From Standing Position, Turn to Look Behind Over each Shoulder  Looks behind from both sides and weight shifts well    Turn 360 Degrees  Able to turn 360 degrees safely one side only in 4 seconds or less    Standing Unsupported, Alternately Place Feet on Step/Stool  Able to stand independently and safely and complete 8 steps in 20 seconds    Standing Unsupported, One Foot in Front  Able to plae foot ahead of the other independently and hold 30 seconds    Standing on One Leg  Able to lift leg independently and hold equal to or more than 3 seconds    Total Score  51                            PT Education - 07/27/19 1655    Education Details  On reassessment findings and POC    Person(s) Educated  Patient    Methods  Explanation    Comprehension  Verbalized understanding       PT Short Term Goals - 07/27/19 1656      PT SHORT TERM GOAL #1   Title  Patient will be independent with initial HEP and self-management strategies to improve functional outcomes    Baseline  Reports compliance    Time  3    Period  Weeks    Status  Achieved    Target Date   07/16/19        PT Long Term Goals - 07/27/19 1657      PT LONG TERM GOAL #1   Title  Patient will be able to perform stand x 5 in < 15 seconds to demonstrate improvement in functional mobility and reduced risk for falls.    Baseline  Current: 18 seconds with no UEs    Time  6    Period  Weeks    Status  On-going      PT LONG TERM GOAL #2   Title  Patient will report at least 75% overall improvement in subjective complaint to indicate improvement in ability to perform ADLs.    Baseline  Reports 50%    Time  6    Period  Weeks    Status  On-going      PT LONG TERM GOAL #3   Title  Patient will increase BERG score by at least 5 points to demonstrate improvement in functional mobility and reduced risk for falls.    Baseline  Current: 8 points    Time  6    Period  Weeks    Status  Achieved      PT LONG TERM GOAL #4   Title  Pateitn will report no falls since starting therapy to demonstrate improvement in functional mobility and reduced risk for falls  Baseline  Reports no new falls    Time  6    Period  Weeks    Status  On-going            Plan - 07/27/19 1725    Clinical Impression Statement  Patient is making good progress toward therapy goals., Patient currently with 1/1 short term and  long term therapy goals currently met. Patient shows significant improvement in static balance with BERG scoring. Patient continues to be limited by postural abnormality and slight deficits with functional mobility as indicated by sit to stand x 5 test, though this is much improved since initial evaluation. Patient will continue to benefit from skilled therapy services to progress dynamic balance, strengthening, and comprehensive home exercises with plan to transition to HEP for DC in next 2 visits.    Personal Factors and Comorbidities  Age;Past/Current Experience    Examination-Activity Limitations  Bathing;Stand;Lift;Stairs;Dressing;Squat;Carry;Bend;Transfers;Locomotion Level     Examination-Participation Restrictions  Yard Work;Church;Cleaning;Community Activity;Laundry;Volunteer    Stability/Clinical Decision Making  Stable/Uncomplicated    Rehab Potential  Good    PT Frequency  2x / week    PT Duration  6 weeks    PT Treatment/Interventions  ADLs/Self Care Home Management;Aquatic Therapy;Biofeedback;Cryotherapy;Electrical Stimulation;Contrast Bath;Therapeutic exercise;Orthotic Fit/Training;Compression bandaging;Manual lymph drainage;Patient/family education;Therapeutic activities;Functional mobility training;Cognitive remediation;Manual techniques;Wheelchair mobility training;Passive range of motion;Dry needling;Balance training;Neuromuscular re-education;Gait training;DME Instruction;Iontophoresis '4mg'$ /ml Dexamethasone;Stair training;Moist Heat;Traction;Ultrasound;Parrafin;Fluidtherapy;Taping;Vasopneumatic Device;Spinal Manipulations;Joint Manipulations;Energy conservation;Splinting;Visual/perceptual remediation/compensation    PT Next Visit Plan  Progress as tolerated with focus on balance, posture and funcitonal strengthening. Add obstacle ambulation and incoorperate postural strengthening into HEP. DC in next 2 visits.    PT Home Exercise Plan  06/29/19: sit to stand, heel raise at sink, and staggered standing at sink; 07/22/19: SLS    Consulted and Agree with Plan of Care  Patient       Patient will benefit from skilled therapeutic intervention in order to improve the following deficits and impairments:  Abnormal gait, Postural dysfunction, Decreased activity tolerance, Decreased endurance, Decreased strength, Decreased balance, Difficulty walking, Hypomobility  Visit Diagnosis: Repeated falls  Other abnormalities of gait and mobility     Problem List Patient Active Problem List   Diagnosis Date Noted  . SVT (supraventricular tachycardia) ([place]) 12/23/2018  . Bloating 08/30/2015  . Exertional chest pain 08/16/2015  . Chest pain 08/16/2015  . Mucosal  abnormality of stomach   . Hemorrhoids 04/18/2015  . IBS (irritable bowel syndrome) 01/30/2015  . Type 2 diabetes mellitus ([place]) 12/22/2014  . Hyperlipidemia 12/22/2014  . Prolapse of female pelvic organs 11/28/2013  . Precordial pain 11/25/2013  . Essential hypertension 11/25/2013  . Heart murmur 11/25/2013  . Osteopenia 10/23/2012  . Chronic pancreatitis ([place]) 02/26/2011  . GERD (gastroesophageal reflux disease) 01/18/2011  . Constipation 11/29/2010  . Esophageal spasm 11/29/2010  . DJD (degenerative joint disease) 11/29/2010  . Hx of adenomatous colonic polyps 06/29/2009  . ESOPHAGEAL MOTILITY DISORDER 04/01/2008  . DM 03/01/2008  . Prolapsed hemorrhoids 07/02/2007  . Diverticulosis of large intestine 07/02/2007   5:28 PM, 07/27/19 Josue Hector PT DPT  Physical Therapist with [facility]  [phone] [place] [address] [place], [place], [address] Phone: [phone]   Fax:  [phone]  Name: Deborah Gray MRN: [id number] Date of Birth: 06/06/1933

## 2019-07-29 ENCOUNTER — Ambulatory Visit (HOSPITAL_COMMUNITY): Payer: Medicare Other

## 2019-07-29 ENCOUNTER — Encounter (HOSPITAL_COMMUNITY): Payer: Self-pay

## 2019-07-29 ENCOUNTER — Other Ambulatory Visit: Payer: Self-pay

## 2019-07-29 DIAGNOSIS — R296 Repeated falls: Secondary | ICD-10-CM

## 2019-07-29 DIAGNOSIS — R2689 Other abnormalities of gait and mobility: Secondary | ICD-10-CM

## 2019-07-29 NOTE — Therapy (Signed)
[place], [place], [address] Phone: [phone]   Fax:  [phone]  Physical Therapy Treatment  Patient Details  Name: Deborah Gray MRN: [id number] Date of Birth: [birth year] Referring Provider (PT): Baltazar Apo MD    Encounter Date: 07/29/2019   PT End of Session - 07/29/19 1539    Visit Number 11    Number of Visits 12    Date for PT Re-Evaluation 08/06/19    Authorization Type BCBC Medicare; [place] [employer] secondary, [employer] tertiary; No auth req. no V.L.    Progress Note Due on Visit 10    PT Start Time 1534    PT Stop Time 1613    PT Time Calculation (min) 39 min    Equipment Utilized During Treatment Gait belt    Activity Tolerance Patient tolerated treatment well    Behavior During Therapy WFL for tasks assessed/performed           Past Medical History:  Diagnosis Date  . Arthritis   . Benign neoplasm of colon   . Constipation   . Diaphragmatic hernia without mention of obstruction or gangrene   . Diverticulosis of colon (without mention of hemorrhage)   . Dysphagia, pharyngoesophageal phase   . Esophageal reflux   . Essential hypertension   . Heart murmur   . Hemorrhoids   . Hyperlipidemia   . Internal hemorrhoids without mention of complication   . Left wrist fracture   . PONV (postoperative nausea and vomiting)   . Stricture and stenosis of esophagus   . Type 2 diabetes mellitus ([place])   . Unspecified gastritis and gastroduodenitis without mention of hemorrhage     Past Surgical History:  Procedure Laterality Date  . ABDOMINAL HYSTERECTOMY    . BACTERIAL OVERGROWTH TEST N/A 09/06/2015   Procedure: BACTERIAL OVERGROWTH TEST;  Surgeon: Daneil Dolin, MD;  [facility];  Service: Endoscopy;  Laterality: N/A;  800  . BREAST LUMPECTOMY Right    benign  . CATARACT EXTRACTION Bilateral 2006   Implants in both, surgeries done 6 weeks apart  . CHOLECYSTECTOMY    .  COLONOSCOPY  03/08/02   [name]: diverticulosis, internal hemorrhoids, adenomatous colon polyp  . COLONOSCOPY  01/23/04   [name]: diverticulosis, internal hemorrhoids  . COLONOSCOPY  09/25/05   [name]: diverticulosis  . COLONOSCOPY  07/05/09   [name]: severe diverticulosis  . COLONOSCOPY  01/21/11   [name]: severe diverticulosis in sigmoid to desc colon, int hemorrhoids, follow up TCS in 5 years  . COLONOSCOPY N/A 04/10/2016   Procedure: COLONOSCOPY;  Surgeon: Daneil Dolin, MD;  [facility]: [facility];  Service: Endoscopy;  Laterality: N/A;  12:30 PM  . DILATION AND CURETTAGE OF UTERUS    . ESOPHAGEAL MANOMETRY  03/21/08   [facility]: findings c/w Nutcracker Esophagus  . ESOPHAGOGASTRODUODENOSCOPY  03/08/02   [name]: esophageal stricture, chronic gerd, s/p dilation.   . ESOPHAGOGASTRODUODENOSCOPY  01/23/04   [name]: esophageal stricture, gastritis, hiatal hernia  . ESOPHAGOGASTRODUODENOSCOPY  09/25/05   Patterson: gastritis, benign bx, no H.pylori  . ESOPHAGOGASTRODUODENOSCOPY  07/05/09   Patterson: gastropathy, benign small bowel bx and gastric bx  . ESOPHAGOGASTRODUODENOSCOPY N/A 05/15/2015   Dr. Gala Romney- diffuse moderate inflammation characterized by congestion (edema), erythema, and linear erosions was found in the entire examined stomach. bx= reactive gastropathy  . FOOT SURGERY Left   . HEMORRHOID BANDING  2017   Dr.[name]  . LAPAROSCOPIC VAGINAL HYSTERECTOMY    . ORIF WRIST  FRACTURE Left 06/23/2018   Procedure: OPEN REDUCTION INTERNAL FIXATION (ORIF) LEFT WRIST FRACTURE;  Surgeon: Renette Butters, MD;  Location: [place] [facility];  Service: Orthopedics;  Laterality: Left;  regional arm block  . RECTOCELE REPAIR    . TOTAL KNEE ARTHROPLASTY Right     There were no vitals filed for this visit.   Subjective Assessment - 07/29/19 1537    Subjective Pt stated she is feeling good today, no reports of pain or recent fall.    Pertinent History RT TKA in 2010; Hx of  vertigo    Patient Stated Goals Be more steady on my feet, I'd like ot be a little straighter (posture)    Currently in Pain? No/denies                             [facility] Adult PT Treatment/Exercise - 07/29/19 0001      Posture/Postural Control   Posture/Postural Control Postural limitations    Postural Limitations Increased thoracic kyphosis;Weight shift right;Forward head;Rounded Shoulders      Exercises   Exercises Knee/Hip      Knee/Hip Exercises: Standing   Heel Raises 10 reps    Heel Raises Limitations wall arch with heel raise    SLS with Vectors 3x 5" BLE intermittent HHA    Other Standing Knee Exercises RTB row and shoulder extension 15x each; 10x HEP to review form    Other Standing Knee Exercises 5 cones and 2 6in hurdles forward and lateral 2RT      Knee/Hip Exercises: Seated   Sit to Sand 10 reps;without UE support;2 sets;5 reps   10x  X to Y with sit to stand; 5STS 14.2"                    PT Short Term Goals - 07/27/19 1656      PT SHORT TERM GOAL #1   Title Patient will be independent with initial HEP and self-management strategies to improve functional outcomes    Baseline Reports compliance    Time 3    Period Weeks    Status Achieved    Target Date 07/16/19             PT Long Term Goals - 07/27/19 1657      PT LONG TERM GOAL #1   Title Patient will be able to perform stand x 5 in < 15 seconds to demonstrate improvement in functional mobility and reduced risk for falls.    Baseline Current: 18 seconds with no UEs    Time 6    Period Weeks    Status On-going      PT LONG TERM GOAL #2   Title Patient will report at least 75% overall improvement in subjective complaint to indicate improvement in ability to perform ADLs.    Baseline Reports 50%    Time 6    Period Weeks    Status On-going      PT LONG TERM GOAL #3   Title Patient will increase BERG score by at least 5 points to demonstrate improvement in functional  mobility and reduced risk for falls.    Baseline Current: 8 points    Time 6    Period Weeks    Status Achieved      PT LONG TERM GOAL #4   Title Pateitn will report no falls since starting therapy to demonstrate improvement in functional mobility and reduced risk  for falls    Baseline Reports no new falls    Time 6    Period Weeks    Status On-going                 Plan - 07/29/19 1548    Clinical Impression Statement Pt instructed and given RTB to add postural strengthening to HEP, pt able to demonstrate and verbalize appropriate mechanics.  Added obstacle course for balance training, 2 LOB episdoes required min A for safety due to pt rushing to complete task.  Pt encouraged to slow down and stabilize prior attempt with obstacles.    Personal Factors and Comorbidities Age;Past/Current Experience    Examination-Activity Limitations Bathing;Stand;Lift;Stairs;Dressing;Squat;Carry;Bend;Transfers;Locomotion Level    Examination-Participation Restrictions Yard Work;Church;Cleaning;Community Activity;Laundry;Volunteer    Stability/Clinical Decision Making Stable/Uncomplicated    Clinical Decision Making Low    Rehab Potential Good    PT Frequency 2x / week    PT Duration 6 weeks    PT Treatment/Interventions ADLs/Self Care Home Management;Aquatic Therapy;Biofeedback;Cryotherapy;Electrical Stimulation;Contrast Bath;Therapeutic exercise;Orthotic Fit/Training;Compression bandaging;Manual lymph drainage;Patient/family education;Therapeutic activities;Functional mobility training;Cognitive remediation;Manual techniques;Wheelchair mobility training;Passive range of motion;Dry needling;Balance training;Neuromuscular re-education;Gait training;DME Instruction;Iontophoresis 4mg /ml Dexamethasone;Stair training;Moist Heat;Traction;Ultrasound;Parrafin;Fluidtherapy;Taping;Vasopneumatic Device;Spinal Manipulations;Joint Manipulations;Energy conservation;Splinting;Visual/perceptual  remediation/compensation    PT Next Visit Plan DC next session.  Review HEP to continue at home.    PT Home Exercise Plan 06/29/19: sit to stand, heel raise at sink, and staggered standing at sink; 07/22/19: SLS; 07/29/19: RTB shoulder extension and rows.           Patient will benefit from skilled therapeutic intervention in order to improve the following deficits and impairments:  Abnormal gait, Postural dysfunction, Decreased activity tolerance, Decreased endurance, Decreased strength, Decreased balance, Difficulty walking, Hypomobility  Visit Diagnosis: Repeated falls  Other abnormalities of gait and mobility     Problem List Patient Active Problem List   Diagnosis Date Noted  . SVT (supraventricular tachycardia) ([place]) 12/23/2018  . Bloating 08/30/2015  . Exertional chest pain 08/16/2015  . Chest pain 08/16/2015  . Mucosal abnormality of stomach   . Hemorrhoids 04/18/2015  . IBS (irritable bowel syndrome) 01/30/2015  . Type 2 diabetes mellitus ([place]) 12/22/2014  . Hyperlipidemia 12/22/2014  . Prolapse of female pelvic organs 11/28/2013  . Precordial pain 11/25/2013  . Essential hypertension 11/25/2013  . Heart murmur 11/25/2013  . Osteopenia 10/23/2012  . Chronic pancreatitis ([place]) 02/26/2011  . GERD (gastroesophageal reflux disease) 01/18/2011  . Constipation 11/29/2010  . Esophageal spasm 11/29/2010  . DJD (degenerative joint disease) 11/29/2010  . Hx of adenomatous colonic polyps 06/29/2009  . ESOPHAGEAL MOTILITY DISORDER 04/01/2008  . DM 03/01/2008  . Prolapsed hemorrhoids 07/02/2007  . Diverticulosis of large intestine 07/02/2007   Ihor Austin, LPTA/CLT; CBIS [phone]  Aldona Lento 07/29/2019, 5:35 PM  [place], [place], [address] Phone: [phone]   Fax:  [phone]  Name: Deborah Gray MRN: [id number] Date of Birth: [birth year]

## 2019-08-02 ENCOUNTER — Other Ambulatory Visit: Payer: Self-pay

## 2019-08-02 ENCOUNTER — Ambulatory Visit (INDEPENDENT_AMBULATORY_CARE_PROVIDER_SITE_OTHER): Payer: Medicare Other | Admitting: Gastroenterology

## 2019-08-02 ENCOUNTER — Encounter: Payer: Self-pay | Admitting: Gastroenterology

## 2019-08-02 ENCOUNTER — Telehealth: Payer: Self-pay | Admitting: Gastroenterology

## 2019-08-02 VITALS — BP 147/76 | HR 92 | Temp 97.5°F | Ht 64.0 in | Wt 137.0 lb

## 2019-08-02 DIAGNOSIS — K224 Dyskinesia of esophagus: Secondary | ICD-10-CM

## 2019-08-02 DIAGNOSIS — K580 Irritable bowel syndrome with diarrhea: Secondary | ICD-10-CM | POA: Diagnosis not present

## 2019-08-02 DIAGNOSIS — K648 Other hemorrhoids: Secondary | ICD-10-CM

## 2019-08-02 DIAGNOSIS — K861 Other chronic pancreatitis: Secondary | ICD-10-CM | POA: Diagnosis not present

## 2019-08-02 MED ORDER — CILIDINIUM-CHLORDIAZEPOXIDE 2.5-5 MG PO CAPS: 1.0000 | ORAL_CAPSULE | Freq: Four times a day (QID) | ORAL | 3 refills | Status: DC | PRN

## 2019-08-02 NOTE — Telephone Encounter (Signed)
Noted  

## 2019-08-02 NOTE — Progress Notes (Signed)
Primary Care Physician: [name], MD  Primary Gastroenterologist:  Garfield Cornea, MD   Chief Complaint  Patient presents with  . Follow-up    bloating    HPI: Deborah Gray is a 86 y.o. female here for follow-up.  Patient was last seen in May.  She has a history of GERD, intermittent diarrhea, bloating, and hemorrhoids.  Per notes, last month she underwent hemorrhoid banding of the right hemorrhoid column and left mid column.  She underwent banding in the right mid and anterior hemorrhoid column back in March.  Patient has a history of pancreatic exocrine insufficiency diarrhea. Colonoscopy 03/2016 with diverticulosis. She has a history of adenomatous colon polyps. Also with nutcracker esophagus, remote esophageal strictures, esophageal spasms improved on imipramine, idiopathic chronic pancreatitis, IBS.  EGD March 2017 with diffuse moderate inflammation characterized by edema, erythema, linear erosions in the entire examined stomach, biopsy showed reactive gastropathy.  Patient brings in a letter today stating Carolynne Edouard has been denied.  She states she no longer takes it.  She is taking Levsin which her insurance did cover, only takes rarely when she has diarrhea.  Its the only thing that seems to help her diarrhea at this point. For the most part having normal BMs. No melena, brbpr. She reminds me that she is taking Librax specifically for esophageal spasms and only takes it when needed.  Usually if she feels a spasm, typically associated with eating and a sensation that food will not go down, she will take 1 Librax and the symptoms go away within 5 minutes.  She has had the same prescription for nearly 2 years.  She rarely has to take it.  She likes to keep it on hand and her current refills have expired. She tells me she did stop taking the omeprazole about 4 weeks ago.  She felt like it was creating more forgetfulness and feels much clearer since she stopped taking the  medication.  She has not noted any kind of decline in any of her GI symptoms at this point.  She denies any heartburn.  No vomiting.  No abdominal pain.  Overall her hemorrhoids are better. Rarely had pain or bleeding before, mostly concerned about prolapse. She wants to come back and see Dr. Gala Romney in a few months and consider additional banding if appropriate.   Recent stress test unremarkable.    Overall weight has been stable the past one year.   Wt Readings from Last 3 Encounters:  08/02/19 137 lb (62.1 kg)  07/05/19 139 lb 9.6 oz (63.3 kg)  06/22/19 140 lb 9.6 oz (63.8 kg)     Current Outpatient Medications  Medication Sig Dispense Refill  . acetaminophen (TYLENOL) 500 MG tablet Take 500 mg at bedtime by mouth.     Marland Kitchen aspirin EC 81 MG tablet Take 81 mg by mouth at bedtime.    Marland Kitchen azelastine (ASTELIN) 0.1 % nasal spray Place 1 spray into both nostrils 2 (two) times daily as needed for rhinitis. 30 mL 5  . blood glucose meter kit and supplies KIT Dispense based on  insurance preference. Tests once a day E11.9 1 each 0  . Carboxymethylcellulose Sodium (THERATEARS) 0.25 % SOLN Apply 1 drop to eye 2 (two) times daily.     . clidinium-chlordiazePOXIDE (LIBRAX) 5-2.5 MG capsule Take 1 capsule by mouth 4 (four) times daily as needed.    Marland Kitchen CREON 24000-76000 units CPEP TAKE 3 CAPSULES THREE TIMES DAILY WITH MEALS AND ONE  CAPSULE WITH SNACK TWICE DAILY 990 capsule 2  . ezetimibe (ZETIA) 10 MG tablet TAKE 1 TABLET BY MOUTH EVERY DAY 90 tablet 3  . guaiFENesin (MUCINEX) 600 MG 12 hr tablet Take 600 mg by mouth daily.     . hyoscyamine (LEVSIN SL) 0.125 MG SL tablet Place 1 tablet (0.125 mg total) under the tongue every 4 (four) hours as needed. Use one under the tongue before meals and at bedtime as needed for flares in symptoms. (Patient taking differently: Place 0.125 mg under the tongue as needed. Use one under the tongue before meals and at bedtime as needed for flares in symptoms.) 30 tablet 3  .  imipramine (TOFRANIL) 10 MG tablet TAKE 1 TABLET BY MOUTH EVERYDAY AT BEDTIME 90 tablet 3  . losartan (COZAAR) 50 MG tablet Take 1 tablet (50 mg total) by mouth every evening. 90 tablet 3  . meclizine (ANTIVERT) 25 MG tablet TAKE 1 TABLET (25 MG TOTAL) BY MOUTH 3 (THREE) TIMES DAILY AS NEEDED FOR DIZZINESS OR NAUSEA. (Patient taking differently: as needed. TAKE 1 TABLET (25 MG TOTAL) BY MOUTH 3 (THREE) TIMES DAILY AS NEEDED FOR DIZZINESS OR NAUSEA.) 30 tablet 3  . metFORMIN (GLUCOPHAGE) 500 MG tablet TAKE ONE TABLET BY MOUTH EVERY MORNING ONE AT LUNCH AND 2 AT BEDTIME (Patient taking differently: Take 500 mg by mouth in the morning and at bedtime. TAKE ONE TABLET BY MOUTH EVERY MORNING and  ONE or 2 AT BEDTIME) 360 tablet 1  . methenamine (HIPREX) 1 g tablet Take 1 tablet (1 g total) by mouth 2 (two) times daily. 180 tablet 3  . mirabegron ER (MYRBETRIQ) 25 MG TB24 tablet Take 50 mg every morning by mouth.     . Multiple Vitamins-Minerals (PRESERVISION AREDS 2) CAPS Take 1 capsule by mouth 2 (two) times daily.     . nateglinide (STARLIX) 120 MG tablet TAKE 1 TABLET BY MOUTH 3 TIMES A DAY BEFORE MEALS 270 tablet 1  . NON FORMULARY [employer] probiotic  Once a day    . omega-3 acid ethyl esters (LOVAZA) 1 g capsule Take 3 g by mouth daily.     Marland Kitchen OVER THE COUNTER MEDICATION Vitamin D     One a day  Not sure strength    . Vitamin Mixture (ESTER-C) 500-60 MG TABS Take 1 tablet by mouth daily.      No current facility-administered medications for this visit.    Allergies as of 08/02/2019 - Review Complete 07/29/2019  Allergen Reaction Noted  . Adhesive [tape] Other (See Comments) 07/21/2016  . Estrogens Other (See Comments) 10/15/2012  . Sulfonamide derivatives Rash 03/01/2008    ROS:  General: Negative for anorexia, weight loss, fever, chills, fatigue, weakness. ENT: Negative for hoarseness, difficulty swallowing , nasal congestion. CV: Negative for chest pain, angina, palpitations, dyspnea on  exertion, peripheral edema.  Respiratory: Negative for dyspnea at rest, dyspnea on exertion, cough, sputum, wheezing.  GI: See history of present illness. GU:  Negative for dysuria, hematuria, urinary incontinence, urinary frequency, nocturnal urination.  Endo: Negative for unusual weight change.    Physical Examination:   BP (!) 147/76   Pulse 92   Temp (!) 97.5 F (36.4 C) (Temporal)   Ht '5\' 4"'$  (1.626 m)   Wt 137 lb (62.1 kg)   BMI 23.52 kg/m   General: Well-nourished, well-developed in no acute distress.  Eyes: No icterus. Mouth: masked Lungs: Clear to auscultation bilaterally.  Heart: Regular rate and rhythm, no murmurs rubs or gallops.  Abdomen: Bowel sounds are normal, nontender, nondistended, no hepatosplenomegaly or masses, no abdominal bruits or hernia , no rebound or guarding.   Extremities: No lower extremity edema. No clubbing or deformities. Neuro: Alert and oriented x 4   Skin: Warm and dry, no jaundice.   Psych: Alert and cooperative, normal mood and affect.  Labs:  Lab Results  Component Value Date   CREATININE 0.66 05/25/2019   BUN 28 (H) 05/25/2019   NA 139 05/25/2019   K 3.5 05/25/2019   CL 102 05/25/2019   CO2 27 05/25/2019   Lab Results  Component Value Date   ALT 11 06/04/2019   AST 14 06/04/2019   ALKPHOS 86 06/04/2019   BILITOT 0.2 06/04/2019   Lab Results  Component Value Date   WBC 8.0 05/25/2019   HGB 13.9 05/25/2019   HCT 43.7 05/25/2019   MCV 91.4 05/25/2019   PLT 261 05/25/2019    Imaging Studies: NM Myocar Multi W/Spect W/Wall Motion / EF  Result Date: 07/08/2019  There was no ST segment deviation noted during stress.  The study is normal. There are no perfusion defects consistent with prior infarct or current ischemia.  This is a low risk study.  The left ventricular ejection fraction is normal (55-65%).     Impression/plan:  86 year old female with multiple GI issues including history of nutcracker esophagus,  idiopathic chronic pancreatitis, IBS, hemorrhoids presenting for follow-up.  Initially this appointment was for follow-up with hemorrhoid banding, consider additional banding if needed.  Patient was notified earlier today that I would not be able to help her with additional hemorrhoid banding if needed and we offered to reschedule her to Roseanne Kaufman, NP or Dr. Roseanne Kaufman schedule however she reported that she did not desire additional hemorrhoid banding today but would like to discuss her other concerns and to obtain refills on medication. She opted to keep the appointment.  Overall her hemorrhoidal disease has improved but she feels like she may benefit from additional banding.  We will have her come back to see Dr. Gala Romney per her request in the next 2 to 3 months to consider additional hemorrhoid banding if appropriate.  Regarding chronic intermittent diarrhea felt to be due to IBS and/or idiopathic chronic pancreatitis, she is doing reasonably well.  She continues Creon on a regular basis.  She rarely has to take Levsin for her diarrhea which provides good control.  She was not able to stay on Levbid due to lack of insurance coverage.  We will continue current regimen.  Nutcracker esophagus/esophageal spasms reasonly well controlled.  She is on imipramine regularly.  She takes Librax which she finds helpful for esophageal spasms, symptoms resolved within 5 minutes of taking the medication.  She rarely takes Librax and actually requires updated prescription because the last one expired before she completed all the refills.  She likes to keep on hand for when she needs it.  She will monitor her symptoms now that she came off omeprazole.  Reflux can trigger spasms and she finds symptoms are progressive she may need to consider resuming omeprazole 20 mg daily.  For now she wants to hold off because she feels like she has clearer mentation while off medication.

## 2019-08-02 NOTE — Patient Instructions (Addendum)
1. I have refilled the Librax that you are using for esophageal spasms. Continue your other medications as before.  2. We will have you come back to see Dr. Gala Romney in the next 2-3 months when his office schedule becomes available. He can reassess your hemorrhoids and provide additional banding if needed.

## 2019-08-02 NOTE — Progress Notes (Signed)
Cc'ed to pcp °

## 2019-08-02 NOTE — Telephone Encounter (Signed)
Noted. Spoke with pt and she has no hemorrhoidal issues to discuss today. Pt is aware that if she is having issues with her hemorrhoids, she can be scheduled with AB. Per pt hemorrhoids aren't an issue and she is coming to her apt with LSL today.

## 2019-08-02 NOTE — Telephone Encounter (Signed)
Patient is on my schedule today at 10:30. She was moved from AB schedule on Thursday to mine due to shifting of the schedules later this week.   She was on AB schedule for possible additional hemorrhoid banding (as per RMR recommendations last month). I spoke to AB, described previous hemorrhoid banding already performed. She suggested reaching out to patient and finding out if she is still having hemorrhoid symptoms. If she is, then we need to get her on AB schedule so she can do additional hemorrhoid banding (I don't due [facility] banding).   If she is doing better and does not desire additional hemorrhoid banding today, then I can see her in follow up.   Vicente Males has an opening Wednesday at 8:30.

## 2019-08-03 ENCOUNTER — Encounter (HOSPITAL_COMMUNITY): Payer: Self-pay | Admitting: Physical Therapy

## 2019-08-03 ENCOUNTER — Other Ambulatory Visit: Payer: Self-pay

## 2019-08-03 ENCOUNTER — Ambulatory Visit (HOSPITAL_COMMUNITY): Payer: Medicare Other | Admitting: Physical Therapy

## 2019-08-03 DIAGNOSIS — R296 Repeated falls: Secondary | ICD-10-CM

## 2019-08-03 DIAGNOSIS — R2689 Other abnormalities of gait and mobility: Secondary | ICD-10-CM

## 2019-08-03 NOTE — Patient Instructions (Signed)
Access Code: [email or web address] URL: [email or web address] Date: 08/03/2019 Prepared by: Josue Hector  Exercises Standing Hip Abduction with Counter Support - 1 x daily - 5 x weekly - 2 sets - 10 reps Standing Hip Extension with Counter Support - 1 x daily - 5 x weekly - 2 sets - 10 reps

## 2019-08-03 NOTE — Therapy (Signed)
[facility] [address] [place], [place], [address] Phone: [phone]   Fax:  [phone]  Physical Therapy Treatment/ Discharge Summary   Patient Details  Name: Deborah Gray MRN: [id number] Date of Birth: [birth year] Referring Provider (PT): Baltazar Apo MD    Encounter Date: 08/03/2019   PHYSICAL THERAPY DISCHARGE SUMMARY  Visits from Start of Care: 12  Current functional level related to goals / functional outcomes: See below   Remaining deficits: See below   Education / Equipment:  See assessment  Plan: Patient agrees to discharge.  Patient goals were met. Patient is being discharged due to being pleased with the current functional level.  ?????        PT End of Session - 08/03/19 1539    Visit Number 12    Number of Visits 12    Date for PT Re-Evaluation 08/06/19    Authorization Type BCBC Medicare; [place] state health secondary, [employer] tertiary; No auth req. no V.L.    Progress Note Due on Visit 10    PT Start Time 1520    PT Stop Time 1555    PT Time Calculation (min) 35 min    Equipment Utilized During Treatment Gait belt    Activity Tolerance Patient tolerated treatment well    Behavior During Therapy WFL for tasks assessed/performed           Past Medical History:  Diagnosis Date  . Arthritis   . Benign neoplasm of colon   . Constipation   . Diaphragmatic hernia without mention of obstruction or gangrene   . Diverticulosis of colon (without mention of hemorrhage)   . Dysphagia, pharyngoesophageal phase   . Esophageal reflux   . Essential hypertension   . Heart murmur   . Hemorrhoids   . Hyperlipidemia   . Internal hemorrhoids without mention of complication   . Left wrist fracture   . PONV (postoperative nausea and vomiting)   . Stricture and stenosis of esophagus   . Type 2 diabetes mellitus ([place])   . Unspecified gastritis and gastroduodenitis without mention of hemorrhage     Past  Surgical History:  Procedure Laterality Date  . ABDOMINAL HYSTERECTOMY    . BACTERIAL OVERGROWTH TEST N/A 09/06/2015   Procedure: BACTERIAL OVERGROWTH TEST;  Surgeon: Daneil Dolin, MD;  [facility];  Service: Endoscopy;  Laterality: N/A;  800  . BREAST LUMPECTOMY Right    benign  . CATARACT EXTRACTION Bilateral 2006   Implants in both, surgeries done 6 weeks apart  . CHOLECYSTECTOMY    . COLONOSCOPY  03/08/02   [name]: diverticulosis, internal hemorrhoids, adenomatous colon polyp  . COLONOSCOPY  01/23/04   [name]: diverticulosis, internal hemorrhoids  . COLONOSCOPY  09/25/05   [name]: diverticulosis  . COLONOSCOPY  07/05/09   [name]: severe diverticulosis  . COLONOSCOPY  01/21/11   [name]: severe diverticulosis in sigmoid to desc colon, int hemorrhoids, follow up TCS in 5 years  . COLONOSCOPY N/A 04/10/2016   [name]: diverticulosis  . DILATION AND CURETTAGE OF UTERUS    . ESOPHAGEAL MANOMETRY  03/21/08   [facility]terson: findings c/w [place] Esophagus  . ESOPHAGOGASTRODUODENOSCOPY  03/08/02   [name]: esophageal stricture, chronic gerd, s/p dilation.   . ESOPHAGOGASTRODUODENOSCOPY  01/23/04   [name]: esophageal stricture, gastritis, hiatal hernia  . ESOPHAGOGASTRODUODENOSCOPY  09/25/05   [facility]terson: gastritis, benign bx, no H.pylori  . ESOPHAGOGASTRODUODENOSCOPY  07/05/09   [facility]terson: gastropathy, benign small bowel bx and gastric bx  . ESOPHAGOGASTRODUODENOSCOPY  N/A 05/15/2015   Dr. Gala Romney- diffuse moderate inflammation characterized by congestion (edema), erythema, and linear erosions was found in the entire examined stomach. bx= reactive gastropathy  . FOOT SURGERY Left   . HEMORRHOID BANDING  2017   Dr.[name]  . LAPAROSCOPIC VAGINAL HYSTERECTOMY    . ORIF WRIST FRACTURE Left 06/23/2018   Procedure: OPEN REDUCTION INTERNAL FIXATION (ORIF) LEFT WRIST FRACTURE;  Surgeon: Renette Butters, MD;  Location: [facility];  Service: Orthopedics;  Laterality:  Left;  regional arm block  . RECTOCELE REPAIR    . TOTAL KNEE ARTHROPLASTY Right     There were no vitals filed for this visit.   Subjective Assessment - 08/03/19 1526    Subjective Patient reports without complaint. Says she is doing well and feels prepared for DC from therapy today.    Pertinent History RT TKA in 2010; Hx of vertigo    Patient Stated Goals Be more steady on my feet, I'd like ot be a little straighter (posture)    Currently in Pain? No/denies              [facility] PT Assessment - 08/03/19 0001      Assessment   Medical Diagnosis Gait Instability    Referring Provider (PT) Baltazar Apo MD     Prior Therapy Had for RT TKA 2010      Precautions   Precautions Fall      Restrictions   Weight Bearing Restrictions No      Home Environment   Living Environment Private residence      Prior Function   Level of Independence Independent with basic ADLs      Cognition   Overall Cognitive Status Within Functional Limits for tasks assessed      Transfers   Five time sit to stand comments  13.77 sec with no UEs                                 PT Education - 08/03/19 1539    Education Details On DC status and transition to IND with HEP    Person(s) Educated Patient    Methods Explanation    Comprehension Verbalized understanding            PT Short Term Goals - 07/27/19 1656      PT SHORT TERM GOAL #1   Title Patient will be independent with initial HEP and self-management strategies to improve functional outcomes    Baseline Reports compliance    Time 3    Period Weeks    Status Achieved    Target Date 07/16/19             PT Long Term Goals - 08/03/19 1545      PT LONG TERM GOAL #1   Title Patient will be able to perform stand x 5 in < 15 seconds to demonstrate improvement in functional mobility and reduced risk for falls.    Baseline Current: 13.77 seconds with no UEs    Time 6    Period Weeks    Status Achieved       PT LONG TERM GOAL #2   Title Patient will report at least 75% overall improvement in subjective complaint to indicate improvement in ability to perform ADLs.    Baseline Reports 50% (says some areas more, som areas less) Feels some things have improved more but "not 100%"    Time  6    Period Weeks    Status Partially Met      PT LONG TERM GOAL #3   Title Patient will increase BERG score by at least 5 points to demonstrate improvement in functional mobility and reduced risk for falls.    Baseline Current: 8 points    Time 6    Period Weeks    Status Achieved      PT LONG TERM GOAL #4   Title Patient will report no falls since starting therapy to demonstrate improvement in functional mobility and reduced risk for falls    Baseline Reports no new falls    Time 6    Period Weeks    Status Achieved                 Plan - 08/03/19 1559    Clinical Impression Statement Patient has made good progress toward therapy goals and current has met/ partially met all goals. Discussed transition to DC with HEP with patient today. Patient agrees with this plan. Encouraged and answered all patient questions at today's session. Issued patient updated HEP handout, and instructed patient to follow up with any further questions or concerns.    Personal Factors and Comorbidities Age;Past/Current Experience    Examination-Activity Limitations Bathing;Stand;Lift;Stairs;Dressing;Squat;Carry;Bend;Transfers;Locomotion Level    Examination-Participation Restrictions Yard Work;Church;Cleaning;Community Activity;Laundry;Volunteer    Stability/Clinical Decision Making Stable/Uncomplicated    Rehab Potential Good    PT Treatment/Interventions ADLs/Self Care Home Management;Aquatic Therapy;Biofeedback;Cryotherapy;Electrical Stimulation;Contrast Bath;Therapeutic exercise;Orthotic Fit/Training;Compression bandaging;Manual lymph drainage;Patient/family education;Therapeutic activities;Functional mobility  training;Cognitive remediation;Manual techniques;Wheelchair mobility training;Passive range of motion;Dry needling;Balance training;Neuromuscular re-education;Gait training;DME Instruction;Iontophoresis 65m/ml Dexamethasone;Stair training;Moist Heat;Traction;Ultrasound;Parrafin;Fluidtherapy;Taping;Vasopneumatic Device;Spinal Manipulations;Joint Manipulations;Energy conservation;Splinting;Visual/perceptual remediation/compensation    PT Next Visit Plan DC    PT Home Exercise Plan 06/29/19: sit to stand, heel raise at sink, and staggered standing at sink; 07/22/19: SLS; 07/29/19: RTB shoulder extension and rows.           Patient will benefit from skilled therapeutic intervention in order to improve the following deficits and impairments:  Abnormal gait, Postural dysfunction, Decreased activity tolerance, Decreased endurance, Decreased strength, Decreased balance, Difficulty walking, Hypomobility  Visit Diagnosis: Repeated falls  Other abnormalities of gait and mobility     Problem List Patient Active Problem List   Diagnosis Date Noted  . SVT (supraventricular tachycardia) ([place] 12/23/2018  . Bloating 08/30/2015  . Exertional chest pain 08/16/2015  . Chest pain 08/16/2015  . Mucosal abnormality of stomach   . Hemorrhoids 04/18/2015  . IBS (irritable bowel syndrome) 01/30/2015  . Type 2 diabetes mellitus ([place] 12/22/2014  . Hyperlipidemia 12/22/2014  . Prolapse of female pelvic organs 11/28/2013  . Precordial pain 11/25/2013  . Essential hypertension 11/25/2013  . Heart murmur 11/25/2013  . Osteopenia 10/23/2012  . Chronic pancreatitis ([place] 02/26/2011  . GERD (gastroesophageal reflux disease) 01/18/2011  . Constipation 11/29/2010  . Esophageal spasm 11/29/2010  . DJD (degenerative joint disease) 11/29/2010  . Hx of adenomatous colonic polyps 06/29/2009  . ESOPHAGEAL MOTILITY DISORDER 04/01/2008  . DM 03/01/2008  . Prolapsed hemorrhoids 07/02/2007  . Diverticulosis of large  intestine 07/02/2007   4:04 PM, 08/03/19 CJosue HectorPT DPT  Physical Therapist [facility] ([phone]  [facility] [id number]: [phone]  Fax:  [phone] Name: Deborah WHITENERMRN: [id number] of Birth: [birth year]

## 2019-08-05 ENCOUNTER — Ambulatory Visit: Payer: Medicare Other | Admitting: Gastroenterology

## 2019-08-05 ENCOUNTER — Ambulatory Visit (HOSPITAL_COMMUNITY): Payer: Medicare Other | Admitting: Physical Therapy

## 2019-08-12 DIAGNOSIS — S134XXA Sprain of ligaments of cervical spine, initial encounter: Secondary | ICD-10-CM | POA: Diagnosis not present

## 2019-08-12 DIAGNOSIS — S233XXA Sprain of ligaments of thoracic spine, initial encounter: Secondary | ICD-10-CM | POA: Diagnosis not present

## 2019-08-12 DIAGNOSIS — S338XXA Sprain of other parts of lumbar spine and pelvis, initial encounter: Secondary | ICD-10-CM | POA: Diagnosis not present

## 2019-08-18 ENCOUNTER — Other Ambulatory Visit: Payer: Self-pay

## 2019-08-18 ENCOUNTER — Ambulatory Visit (INDEPENDENT_AMBULATORY_CARE_PROVIDER_SITE_OTHER): Payer: Medicare Other

## 2019-08-18 DIAGNOSIS — N3010 Interstitial cystitis (chronic) without hematuria: Secondary | ICD-10-CM

## 2019-08-18 LAB — POCT URINALYSIS DIPSTICK
Glucose, UA: NEGATIVE
Leukocytes, UA: NEGATIVE
Nitrite, UA: NEGATIVE
Protein, UA: NEGATIVE
Spec Grav, UA: <=1.005 — AB (ref 1.010–1.025)
Urobilinogen, UA: 0.2 E.U./dL
pH, UA: 6 (ref 5.0–8.0)

## 2019-08-18 MED ORDER — HYDROCORTISONE NA SUCCINATE PF 100 MG IJ SOLR
100.0000 mg | Freq: Once | INTRAMUSCULAR | Status: AC
  Administered 2019-08-18: 100 mg

## 2019-08-18 MED ORDER — LIDOCAINE HCL 2 % IJ SOLN
10.0000 mL | Freq: Once | INTRAMUSCULAR | Status: AC
  Administered 2019-08-18: 200 mg

## 2019-08-18 MED ORDER — DIMETHYL SULFOXIDE 50 % IS SOLN
50.0000 mL | Freq: Once | INTRAVESICAL | Status: AC
  Administered 2019-08-18: 50 mL via URETHRAL

## 2019-08-18 MED ORDER — SODIUM BICARBONATE 8.4 % IV SOLN
50.0000 meq | Freq: Once | INTRAVENOUS | Status: AC
  Administered 2019-08-18: 50 meq via INTRAVENOUS

## 2019-08-18 NOTE — Progress Notes (Signed)
Bladder Instillation  Due to IC patient is present today for a Bladder Instillation of DMSO tx. Patient was cleaned and prepped in a sterile fashion with betadine and lidocaine 2% jelly was instilled into the urethra.  A 18FR catheter was inserted, urine return was noted 11ml, urine was yellow in color.  50 ml was instilled into the bladder. The catheter was then removed. Patient tolerated well, no complications were noted Patient held in bladder for 30 minutes prior to procedure starting.   Preformed by: Shamikia Linskey,LPN   Follow up/ Additional notes: 1 month NV

## 2019-08-24 ENCOUNTER — Telehealth: Payer: Self-pay | Admitting: Internal Medicine

## 2019-08-24 NOTE — Telephone Encounter (Signed)
[phone]  Please call patient about her medications.

## 2019-08-25 NOTE — Telephone Encounter (Signed)
Will keep reaching out to pts insurance company. I called today 08/25/19 and held for 20 mins.

## 2019-08-26 NOTE — Telephone Encounter (Signed)
Left a detailed message for pt

## 2019-08-26 NOTE — Telephone Encounter (Signed)
Pt returned call. Pt needs a refill of hyoscyamine (Levsin SL) 0.125 mg SL tab, place 1 tab under tongue q 4hrs as needed. Pt's insurance didn't want to cover the hyoscyamine tabs and pt has previously been on the SL tabs. Please send to pts pharmacy.

## 2019-08-26 NOTE — Telephone Encounter (Signed)
Called pts insurance company. I spoke with one Rep and I was transferred to another representative. I waiting for 20 min before handing up. Will call insurance company back.

## 2019-08-27 MED ORDER — HYOSCYAMINE SULFATE 0.125 MG SL SUBL: SUBLINGUAL_TABLET | SUBLINGUAL | 3 refills | Status: DC

## 2019-08-27 NOTE — Telephone Encounter (Signed)
Noted. Pt is aware that RX was sent.

## 2019-08-27 NOTE — Addendum Note (Signed)
Addended by: Mahala Menghini on: 08/27/2019 07:39 AM   Modules accepted: Orders

## 2019-08-27 NOTE — Telephone Encounter (Signed)
Rx sent 

## 2019-08-30 DIAGNOSIS — S338XXA Sprain of other parts of lumbar spine and pelvis, initial encounter: Secondary | ICD-10-CM | POA: Diagnosis not present

## 2019-08-30 DIAGNOSIS — S233XXA Sprain of ligaments of thoracic spine, initial encounter: Secondary | ICD-10-CM | POA: Diagnosis not present

## 2019-08-30 DIAGNOSIS — S134XXA Sprain of ligaments of cervical spine, initial encounter: Secondary | ICD-10-CM | POA: Diagnosis not present

## 2019-09-13 DIAGNOSIS — M9901 Segmental and somatic dysfunction of cervical region: Secondary | ICD-10-CM | POA: Diagnosis not present

## 2019-09-13 DIAGNOSIS — S134XXA Sprain of ligaments of cervical spine, initial encounter: Secondary | ICD-10-CM | POA: Diagnosis not present

## 2019-09-13 DIAGNOSIS — S233XXA Sprain of ligaments of thoracic spine, initial encounter: Secondary | ICD-10-CM | POA: Diagnosis not present

## 2019-09-13 DIAGNOSIS — M9902 Segmental and somatic dysfunction of thoracic region: Secondary | ICD-10-CM | POA: Diagnosis not present

## 2019-09-14 DIAGNOSIS — H34832 Tributary (branch) retinal vein occlusion, left eye, with macular edema: Secondary | ICD-10-CM | POA: Diagnosis not present

## 2019-09-15 ENCOUNTER — Ambulatory Visit (INDEPENDENT_AMBULATORY_CARE_PROVIDER_SITE_OTHER): Payer: Medicare Other

## 2019-09-15 ENCOUNTER — Other Ambulatory Visit: Payer: Self-pay

## 2019-09-15 DIAGNOSIS — N3010 Interstitial cystitis (chronic) without hematuria: Secondary | ICD-10-CM | POA: Diagnosis not present

## 2019-09-15 LAB — URINALYSIS, ROUTINE W REFLEX MICROSCOPIC
Bilirubin, UA: NEGATIVE
Glucose, UA: NEGATIVE
Ketones, UA: NEGATIVE
Nitrite, UA: NEGATIVE
Protein,UA: NEGATIVE
Specific Gravity, UA: 1.02 (ref 1.005–1.030)
Urobilinogen, Ur: 0.2 mg/dL (ref 0.2–1.0)
pH, UA: 6 (ref 5.0–7.5)

## 2019-09-15 LAB — MICROSCOPIC EXAMINATION

## 2019-09-15 MED ORDER — DIMETHYL SULFOXIDE 50 % IS SOLN
50.0000 mL | Freq: Once | INTRAVESICAL | Status: AC
  Administered 2019-09-15: 50 mL via URETHRAL

## 2019-09-15 MED ORDER — SODIUM BICARBONATE 8.4 % IV SOLN
50.0000 meq | Freq: Once | INTRAVENOUS | Status: AC
  Administered 2019-09-15: 50 meq

## 2019-09-15 MED ORDER — LIDOCAINE HCL 1 % IJ SOLN
20.0000 mL | Freq: Once | INTRAMUSCULAR | Status: AC
Start: 2019-09-15 — End: 2019-09-15
  Administered 2019-09-15: 20 mL

## 2019-09-15 MED ORDER — HYDROCORTISONE SOD SUCCINATE 100 MG PF FOR IT USE
100.0000 mg | Freq: Once | INTRAMUSCULAR | Status: AC
  Administered 2019-09-15: 100 mg via INTRATHECAL

## 2019-09-15 NOTE — Progress Notes (Signed)
Bladder Instillation DMSO  Due to IC patient is present today for a Bladder Instillation of DMSO treatment. Patient was cleaned and prepped in a sterile fashion with betadine.  A 18FR catheter was inserted, urine return was noted 20ml, urine was yellow in color.  DMSO treatment was instilled into the bladder. The catheter was then removed. Patient tolerated well, no complications were noted pt was instructed to hold the instillation for 20 minutes and then will void it out. Pt voiced understanding.    Preformed by: Valentina Lucks LPN

## 2019-10-04 DIAGNOSIS — M9901 Segmental and somatic dysfunction of cervical region: Secondary | ICD-10-CM | POA: Diagnosis not present

## 2019-10-04 DIAGNOSIS — S134XXA Sprain of ligaments of cervical spine, initial encounter: Secondary | ICD-10-CM | POA: Diagnosis not present

## 2019-10-04 DIAGNOSIS — S338XXA Sprain of other parts of lumbar spine and pelvis, initial encounter: Secondary | ICD-10-CM | POA: Diagnosis not present

## 2019-10-04 DIAGNOSIS — S233XXA Sprain of ligaments of thoracic spine, initial encounter: Secondary | ICD-10-CM | POA: Diagnosis not present

## 2019-10-18 ENCOUNTER — Telehealth: Payer: Self-pay | Admitting: Family Medicine

## 2019-10-18 ENCOUNTER — Ambulatory Visit: Payer: Medicare Other

## 2019-10-18 NOTE — Telephone Encounter (Signed)
Ok, thx. Dr. Lovena Le

## 2019-10-18 NOTE — Telephone Encounter (Signed)
Patient states she is going to go today to have a covid test and offered to schedule an appointment but she wants to wait on scheduling an office visit till she gets the results back. Advised patient of warning signs and to go to urgent care or ER if any problems.

## 2019-10-18 NOTE — Telephone Encounter (Signed)
Since Friday Chills, low grade fever, congestion and fatigue. Patient says she hasn't been around anyone sick. Had a few people at her house on Friday, and also went to prayer group last week at church where one person has tested positive for covid but she wasn't around him other than saying hey and shaking his hand. Has been wearing her mask when she goes to [employer] and [place].  I tried to advise patient that she needs to go for testing today and possibly make an appt but she prefers to speak with the nurse first.  (Was vaccinated in Feb/March).

## 2019-10-19 ENCOUNTER — Ambulatory Visit: Payer: Medicare Other

## 2019-10-26 ENCOUNTER — Other Ambulatory Visit: Payer: Self-pay

## 2019-10-26 ENCOUNTER — Ambulatory Visit (INDEPENDENT_AMBULATORY_CARE_PROVIDER_SITE_OTHER): Payer: Medicare Other

## 2019-10-26 ENCOUNTER — Telehealth: Payer: Self-pay | Admitting: Internal Medicine

## 2019-10-26 DIAGNOSIS — K580 Irritable bowel syndrome with diarrhea: Secondary | ICD-10-CM

## 2019-10-26 DIAGNOSIS — N3010 Interstitial cystitis (chronic) without hematuria: Secondary | ICD-10-CM | POA: Diagnosis not present

## 2019-10-26 LAB — URINALYSIS, ROUTINE W REFLEX MICROSCOPIC
Bilirubin, UA: NEGATIVE
Glucose, UA: NEGATIVE
Ketones, UA: NEGATIVE
Nitrite, UA: NEGATIVE
Specific Gravity, UA: 1.02 (ref 1.005–1.030)
Urobilinogen, Ur: 0.2 mg/dL (ref 0.2–1.0)
pH, UA: 5.5 (ref 5.0–7.5)

## 2019-10-26 LAB — MICROSCOPIC EXAMINATION

## 2019-10-26 MED ORDER — LIDOCAINE HCL 2 % IJ SOLN
10.0000 mL | Freq: Once | INTRAMUSCULAR | Status: AC
Start: 2019-10-26 — End: 2019-10-26
  Administered 2019-10-26: 200 mg

## 2019-10-26 MED ORDER — HYDROCORTISONE SOD SUCCINATE 100 MG PF FOR IT USE
100.0000 mg | Freq: Once | INTRAMUSCULAR | Status: AC
  Administered 2019-10-26: 100 mg via INTRATHECAL

## 2019-10-26 MED ORDER — SODIUM BICARBONATE 8.4 % IV SOLN
50.0000 meq | Freq: Once | INTRAVENOUS | Status: AC
  Administered 2019-10-26: 50 meq

## 2019-10-26 MED ORDER — DIMETHYL SULFOXIDE 50 % IS SOLN
50.0000 mL | Freq: Once | INTRAVESICAL | Status: AC
  Administered 2019-10-26: 50 mL via URETHRAL

## 2019-10-26 NOTE — Telephone Encounter (Signed)
Pt said she needed another refill for generic Librax and [facility] [place]. [phone]

## 2019-10-26 NOTE — Progress Notes (Signed)
Bladder Instillation  Due to IC patient is present today for a Bladder Instillation of RIMSO. Patient was cleaned and prepped in a sterile fashion with betadine and lidocaine 2% jelly was instilled into the urethra.  A 18FR catheter was inserted, urine return was noted 158ml, urine was yellow in color.  DMSO was instilled into the bladder. The catheter was then removed. Patient tolerated well, no complications were noted Patient held in bladder for 30 minutes prior to procedure starting.   Performed by: Gibson Ramp RN  Follow up/ Additional notes: in 1 month

## 2019-10-27 DIAGNOSIS — S134XXA Sprain of ligaments of cervical spine, initial encounter: Secondary | ICD-10-CM | POA: Diagnosis not present

## 2019-10-27 DIAGNOSIS — M9902 Segmental and somatic dysfunction of thoracic region: Secondary | ICD-10-CM | POA: Diagnosis not present

## 2019-10-27 DIAGNOSIS — M9901 Segmental and somatic dysfunction of cervical region: Secondary | ICD-10-CM | POA: Diagnosis not present

## 2019-10-27 DIAGNOSIS — S233XXA Sprain of ligaments of thoracic spine, initial encounter: Secondary | ICD-10-CM | POA: Diagnosis not present

## 2019-10-27 NOTE — Telephone Encounter (Signed)
Pt is requesting a refill of Librax 5-2.5 mg- 1 capsule po 4 times daily as needed.

## 2019-10-28 ENCOUNTER — Telehealth: Payer: Self-pay

## 2019-10-28 NOTE — Telephone Encounter (Signed)
PA for Librax 5-2/5 mg was completed through [email or web address]. waiting on an approval or denial.

## 2019-10-29 ENCOUNTER — Telehealth: Payer: Self-pay | Admitting: Internal Medicine

## 2019-10-29 NOTE — Telephone Encounter (Signed)
Spoke with pt. A message was sent to the provider. Pt is aware that a PA was completed and it was denied this morning. I'm waiting on the denial letter to see if an appeal can be made. Pt is aware.

## 2019-10-29 NOTE — Telephone Encounter (Signed)
Please call patient about her prescription   It is still not at her pharmacy and she is concerned

## 2019-11-01 ENCOUNTER — Telehealth: Payer: Self-pay | Admitting: Internal Medicine

## 2019-11-01 ENCOUNTER — Other Ambulatory Visit: Payer: Self-pay

## 2019-11-01 MED ORDER — CILIDINIUM-CHLORDIAZEPOXIDE 2.5-5 MG PO CAPS: 1.0000 | ORAL_CAPSULE | Freq: Four times a day (QID) | ORAL | 3 refills | Status: DC | PRN

## 2019-11-01 NOTE — Telephone Encounter (Signed)
Pt calling to follow up on her prescription. [phone]

## 2019-11-01 NOTE — Addendum Note (Signed)
Addended by: Gordy Levan, Edom Schmuhl A on: 11/01/2019 07:49 PM   Modules accepted: Orders

## 2019-11-01 NOTE — Telephone Encounter (Signed)
Spoke with pt. I've called her insurance plan and held for 20 mins before call was disconnected. Called back and was told medication must be covered under medicare first before they will cover it. I'm waiting to speak with someone that will fax information to approve med.

## 2019-11-02 DIAGNOSIS — H34832 Tributary (branch) retinal vein occlusion, left eye, with macular edema: Secondary | ICD-10-CM | POA: Diagnosis not present

## 2019-11-08 ENCOUNTER — Telehealth: Payer: Self-pay | Admitting: Internal Medicine

## 2019-11-08 NOTE — Telephone Encounter (Signed)
Working on [employer] for [employer] is stating dicyclomine is their formulary alternative. Deborah Gray, is this appropriate for this pt?

## 2019-11-08 NOTE — Telephone Encounter (Signed)
I spoke with the patient and she stated her insurance doesn't want to cover Librax anymore.    [facility] and spoke [facility] Shawn and he stated that the medication requires a PA and he will fax the form to [place]   [place] ZW#[id number]

## 2019-11-08 NOTE — Telephone Encounter (Signed)
Almyra Free will work on [place] in [employer]

## 2019-11-08 NOTE — Telephone Encounter (Signed)
Patient called and said that she received a letter about her medication from her insurance company.  Letter stated that she will have to try alternative before she can try the one we prescribed, then her insurance may cover the cost

## 2019-11-09 NOTE — Telephone Encounter (Signed)
She has been on Librax for years for esophageal spasms. She rarely has to take. She is on Levsin for IBS, rarely takes. Patient has very difficult to manage GI symptoms. Prefer not to change current regimen.

## 2019-11-09 NOTE — Telephone Encounter (Signed)
PA done and waiting on response from the insurance company.

## 2019-11-10 DIAGNOSIS — S233XXA Sprain of ligaments of thoracic spine, initial encounter: Secondary | ICD-10-CM | POA: Diagnosis not present

## 2019-11-10 DIAGNOSIS — M9901 Segmental and somatic dysfunction of cervical region: Secondary | ICD-10-CM | POA: Diagnosis not present

## 2019-11-10 DIAGNOSIS — S134XXA Sprain of ligaments of cervical spine, initial encounter: Secondary | ICD-10-CM | POA: Diagnosis not present

## 2019-11-10 DIAGNOSIS — M9902 Segmental and somatic dysfunction of thoracic region: Secondary | ICD-10-CM | POA: Diagnosis not present

## 2019-11-11 ENCOUNTER — Telehealth: Payer: Self-pay | Admitting: Internal Medicine

## 2019-11-11 NOTE — Telephone Encounter (Signed)
PATIENT CALLED AGAIN ASKING IF AN ALTERNATIVE MEDICATION WAS FOUND FOR THE ESOPHAGEAL SPASMS SHE IS HAVING

## 2019-11-12 NOTE — Telephone Encounter (Signed)
Medication has been approved. I called the pt and informed her that it was approved.

## 2019-11-12 NOTE — Telephone Encounter (Signed)
Approval faxed to the pharmacy.

## 2019-11-22 ENCOUNTER — Other Ambulatory Visit: Payer: Self-pay | Admitting: Gastroenterology

## 2019-11-23 ENCOUNTER — Ambulatory Visit (INDEPENDENT_AMBULATORY_CARE_PROVIDER_SITE_OTHER): Payer: Medicare Other

## 2019-11-23 ENCOUNTER — Other Ambulatory Visit: Payer: Self-pay

## 2019-11-23 DIAGNOSIS — N3010 Interstitial cystitis (chronic) without hematuria: Secondary | ICD-10-CM

## 2019-11-23 LAB — URINALYSIS, ROUTINE W REFLEX MICROSCOPIC
Bilirubin, UA: NEGATIVE
Glucose, UA: NEGATIVE
Ketones, UA: NEGATIVE
Nitrite, UA: NEGATIVE
Protein,UA: NEGATIVE
RBC, UA: NEGATIVE
Specific Gravity, UA: 1.015 (ref 1.005–1.030)
Urobilinogen, Ur: 0.2 mg/dL (ref 0.2–1.0)
pH, UA: 7 (ref 5.0–7.5)

## 2019-11-23 LAB — MICROSCOPIC EXAMINATION
RBC, Urine: NONE SEEN /hpf (ref 0–2)
Renal Epithel, UA: NONE SEEN /hpf

## 2019-11-23 MED ORDER — SODIUM BICARBONATE 8.4 % IV SOLN
50.0000 meq | Freq: Once | INTRAVENOUS | Status: AC
  Administered 2019-11-23: 50 meq

## 2019-11-23 MED ORDER — DIMETHYL SULFOXIDE 50 % IS SOLN
50.0000 mL | Freq: Once | INTRAVESICAL | Status: AC
  Administered 2019-11-23: 50 mL via URETHRAL

## 2019-11-23 MED ORDER — HYDROCORTISONE SOD SUCCINATE 100 MG PF FOR IT USE
100.0000 mg | Freq: Once | INTRAMUSCULAR | Status: AC
  Administered 2019-11-23: 100 mg via INTRATHECAL

## 2019-11-23 MED ORDER — LIDOCAINE HCL 1 % IJ SOLN
20.0000 mL | Freq: Once | INTRAMUSCULAR | Status: AC
  Administered 2019-11-23: 20 mL

## 2019-11-23 NOTE — Progress Notes (Signed)
Bladder Instillation DMSO  Due to IC patient is present today for a Bladder Instillation of DMSO treatment. Patient was cleaned and prepped in a sterile fashion with betadine.  A 18FR catheter was inserted, urine return was noted 43ml, urine was yellow in color.  DMSO treatment was instilled into the bladder. The catheter was then removed. Patient tolerated well, no complications were noted pt was instructed to hold the instillation for 20 minutes and then will void it out. Pt voiced understanding.    Preformed by: Antionette Char, Favio Moder,LPN  Follow up/ Additional notes:  1 month

## 2019-11-25 DIAGNOSIS — S134XXA Sprain of ligaments of cervical spine, initial encounter: Secondary | ICD-10-CM | POA: Diagnosis not present

## 2019-11-25 DIAGNOSIS — M9902 Segmental and somatic dysfunction of thoracic region: Secondary | ICD-10-CM | POA: Diagnosis not present

## 2019-11-25 DIAGNOSIS — M9901 Segmental and somatic dysfunction of cervical region: Secondary | ICD-10-CM | POA: Diagnosis not present

## 2019-11-25 DIAGNOSIS — S233XXA Sprain of ligaments of thoracic spine, initial encounter: Secondary | ICD-10-CM | POA: Diagnosis not present

## 2019-11-26 ENCOUNTER — Ambulatory Visit: Payer: Medicare Other | Admitting: Cardiovascular Disease

## 2019-11-30 DIAGNOSIS — L280 Lichen simplex chronicus: Secondary | ICD-10-CM | POA: Diagnosis not present

## 2019-11-30 DIAGNOSIS — L218 Other seborrheic dermatitis: Secondary | ICD-10-CM | POA: Diagnosis not present

## 2019-12-13 ENCOUNTER — Telehealth: Payer: Self-pay

## 2019-12-13 ENCOUNTER — Other Ambulatory Visit: Payer: Self-pay

## 2019-12-13 ENCOUNTER — Other Ambulatory Visit: Payer: Medicare Other

## 2019-12-13 DIAGNOSIS — Z87440 Personal history of urinary (tract) infections: Secondary | ICD-10-CM | POA: Diagnosis not present

## 2019-12-13 DIAGNOSIS — N3010 Interstitial cystitis (chronic) without hematuria: Secondary | ICD-10-CM

## 2019-12-13 LAB — URINALYSIS, ROUTINE W REFLEX MICROSCOPIC
Bilirubin, UA: NEGATIVE
Glucose, UA: NEGATIVE
Ketones, UA: NEGATIVE
Nitrite, UA: NEGATIVE
Protein,UA: NEGATIVE
Specific Gravity, UA: 1.01 (ref 1.005–1.030)
Urobilinogen, Ur: 0.2 mg/dL (ref 0.2–1.0)
pH, UA: 6 (ref 5.0–7.5)

## 2019-12-13 LAB — MICROSCOPIC EXAMINATION
Renal Epithel, UA: NONE SEEN /hpf
WBC, UA: >30 /hpf — AB (ref 0–5)

## 2019-12-13 MED ORDER — CEFUROXIME AXETIL 250 MG PO TABS: 250.0000 mg | ORAL_TABLET | Freq: Two times a day (BID) | ORAL | 0 refills | Status: DC

## 2019-12-14 DIAGNOSIS — H34832 Tributary (branch) retinal vein occlusion, left eye, with macular edema: Secondary | ICD-10-CM | POA: Diagnosis not present

## 2019-12-14 NOTE — Telephone Encounter (Signed)
Pt came to drop of urine specimen and is now being treated with abt.

## 2019-12-15 ENCOUNTER — Telehealth: Payer: Self-pay

## 2019-12-16 LAB — URINE CULTURE

## 2019-12-17 ENCOUNTER — Telehealth: Payer: Self-pay

## 2019-12-17 NOTE — Telephone Encounter (Signed)
-----   Message from Cleon Gustin, MD sent at 12/17/2019  8:38 AM EDT ----- Madaline Brilliant she should continue the ceftin ----- Message ----- From: Iris Pert, LPN Sent: [id number]/39/4320   8:12 AM EDT To: Cleon Gustin, MD  Yes, sorry,  you started her on Ceftin.Marland Kitchen ----- Message ----- From: Cleon Gustin, MD Sent: 12/15/2019   6:33 PM EDT To: Iris Pert, LPN  Did she get an antibiotic? ----- Message ----- From: Iris Pert, LPN Sent: 03/79/4446   8:24 AM EDT To: Cleon Gustin, MD  Please review

## 2019-12-17 NOTE — Telephone Encounter (Signed)
Opened in error

## 2019-12-17 NOTE — Telephone Encounter (Signed)
Pt called and made aware

## 2019-12-21 NOTE — Telephone Encounter (Signed)
Pt notified by [facility],LPN.

## 2019-12-22 ENCOUNTER — Other Ambulatory Visit: Payer: Self-pay

## 2019-12-22 ENCOUNTER — Ambulatory Visit (INDEPENDENT_AMBULATORY_CARE_PROVIDER_SITE_OTHER): Payer: Medicare Other

## 2019-12-22 DIAGNOSIS — N3010 Interstitial cystitis (chronic) without hematuria: Secondary | ICD-10-CM | POA: Diagnosis not present

## 2019-12-22 LAB — URINALYSIS, ROUTINE W REFLEX MICROSCOPIC
Bilirubin, UA: NEGATIVE
Glucose, UA: NEGATIVE
Ketones, UA: NEGATIVE
Leukocytes,UA: NEGATIVE
Nitrite, UA: NEGATIVE
Protein,UA: NEGATIVE
Specific Gravity, UA: 1.02 (ref 1.005–1.030)
Urobilinogen, Ur: 0.2 mg/dL (ref 0.2–1.0)
pH, UA: 6 (ref 5.0–7.5)

## 2019-12-22 LAB — MICROSCOPIC EXAMINATION: Renal Epithel, UA: NONE SEEN /hpf

## 2019-12-22 MED ORDER — SODIUM BICARBONATE 8.4 % IV SOLN
50.0000 meq | Freq: Once | INTRAVENOUS | Status: AC
  Administered 2019-12-22: 50 meq

## 2019-12-22 MED ORDER — HYDROCORTISONE SOD SUCCINATE 100 MG PF FOR IT USE
100.0000 mg | Freq: Once | INTRAMUSCULAR | Status: AC
  Administered 2019-12-22: 100 mg via INTRATHECAL

## 2019-12-22 MED ORDER — LIDOCAINE HCL 1 % IJ SOLN
20.0000 mL | Freq: Once | INTRAMUSCULAR | Status: AC
  Administered 2019-12-22: 20 mL

## 2019-12-22 MED ORDER — DIMETHYL SULFOXIDE 50 % IS SOLN
50.0000 mL | Freq: Once | INTRAVESICAL | Status: AC
  Administered 2019-12-22: 50 mL via URETHRAL

## 2019-12-22 NOTE — Progress Notes (Signed)
Bladder Instillation DMSO  Due to IC patient is present today for a Bladder Instillation of DMSO treatment. Patient was cleaned and prepped in a sterile fashion with betadine.  A 18FR catheter was inserted, urine return was noted at 30ml, urine was yellow in color.  DMSO treatment was instilled into the bladder. The catheter was then removed. Patient tolerated well, no complications were noted pt was instructed to hold the instillation for 20 minutes and then will void it out. Pt voiced understanding.    Preformed by: Antionette Char, Tyshon Fanning,LPN  Follow up/ Additional notes: 1 month

## 2019-12-27 ENCOUNTER — Other Ambulatory Visit (HOSPITAL_COMMUNITY): Payer: Self-pay | Admitting: Family Medicine

## 2019-12-27 DIAGNOSIS — Z1231 Encounter for screening mammogram for malignant neoplasm of breast: Secondary | ICD-10-CM

## 2019-12-30 ENCOUNTER — Ambulatory Visit (HOSPITAL_COMMUNITY)
Admission: RE | Admit: 2019-12-30 | Discharge: 2019-12-30 | Disposition: A | Discharge status: Home / Self Care | Payer: Medicare Other | Source: Ambulatory Visit | Attending: Family Medicine | Admitting: Family Medicine

## 2019-12-30 ENCOUNTER — Other Ambulatory Visit: Payer: Self-pay

## 2019-12-30 DIAGNOSIS — Z1231 Encounter for screening mammogram for malignant neoplasm of breast: Secondary | ICD-10-CM | POA: Diagnosis not present

## 2020-01-14 ENCOUNTER — Other Ambulatory Visit: Payer: Self-pay | Admitting: Physician Assistant

## 2020-01-18 ENCOUNTER — Other Ambulatory Visit: Payer: Self-pay

## 2020-01-18 ENCOUNTER — Encounter: Payer: Self-pay | Admitting: Family Medicine

## 2020-01-18 ENCOUNTER — Ambulatory Visit (INDEPENDENT_AMBULATORY_CARE_PROVIDER_SITE_OTHER): Payer: Medicare Other | Admitting: Family Medicine

## 2020-01-18 ENCOUNTER — Telehealth: Payer: Self-pay | Admitting: Urology

## 2020-01-18 VITALS — HR 100 | Temp 98.8°F | Resp 16

## 2020-01-18 DIAGNOSIS — B9689 Other specified bacterial agents as the cause of diseases classified elsewhere: Secondary | ICD-10-CM

## 2020-01-18 DIAGNOSIS — J302 Other seasonal allergic rhinitis: Secondary | ICD-10-CM | POA: Insufficient documentation

## 2020-01-18 DIAGNOSIS — J019 Acute sinusitis, unspecified: Secondary | ICD-10-CM

## 2020-01-18 MED ORDER — AZITHROMYCIN 250 MG PO TABS: ORAL_TABLET | ORAL | 0 refills | Status: DC

## 2020-01-18 MED ORDER — FLUTICASONE PROPIONATE 50 MCG/ACT NA SUSP: 2.0000 | Freq: Every day | NASAL | 1 refills | Status: DC

## 2020-01-18 NOTE — Telephone Encounter (Signed)
Patient called and stated she has some congestion, no fever. She is going to see her PCP this afternoon. She is concerned about her appointment tomorrow with [place] and wanted to know if she should come or not.

## 2020-01-18 NOTE — Telephone Encounter (Signed)
Pt was rescheduled

## 2020-01-18 NOTE — Progress Notes (Signed)
Patient ID: Deborah Gray, female    DOB: 1933/03/02, 86 y.o.   MRN: [id number]   Chief Complaint  Patient presents with  . Sinusitis   Subjective:  CC: stuffy nose, sinus congestion, runny nose  This is a new problem.  Reports today for complaints of sinus congestion and runny nose, this started on 2 to 3 weeks ago then  Sunday she started having a dry cough.  She has tried Mucinex without much relief.  She denies fever, is positive for chills, congestion and cough.  She also reports pain in her face. sinus congestion and runny nose for 2 -3 weeks.   Pain on left side. Feels like skin is burning. Started yesterday.    [place] has a past medical history of Arthritis, Benign neoplasm of colon, Constipation, Diaphragmatic hernia without mention of obstruction or gangrene, Diverticulosis of colon (without mention of hemorrhage), Dysphagia, pharyngoesophageal phase, Esophageal reflux, Essential hypertension, Heart murmur, Hemorrhoids, Hyperlipidemia, Internal hemorrhoids without mention of complication, Left wrist fracture, PONV (postoperative nausea and vomiting), Stricture and stenosis of esophagus, Type 2 diabetes mellitus ([place]), and Unspecified gastritis and gastroduodenitis without mention of hemorrhage.   Outpatient Encounter Medications as of 01/18/2020  Medication Sig  . acetaminophen (TYLENOL) 500 MG tablet Take 500 mg at bedtime by mouth.   Marland Kitchen aspirin EC 81 MG tablet Take 81 mg by mouth at bedtime.  Marland Kitchen azelastine (ASTELIN) 0.1 % nasal spray Place 1 spray into both nostrils 2 (two) times daily as needed for rhinitis.  Marland Kitchen azithromycin (ZITHROMAX) 250 MG tablet Take 2 tablets today by mouth, then one tablet by mouth for four days.  . blood glucose meter kit and supplies KIT Dispense based on  insurance preference. Tests once a day E11.9  . Carboxymethylcellulose Sodium (THERATEARS) 0.25 % SOLN Apply 1 drop to eye 2 (two) times daily.   . clidinium-chlordiazePOXIDE  (LIBRAX) 5-2.5 MG capsule Take 1 capsule by mouth 4 (four) times daily as needed.  Marland Kitchen CREON 24000-76000 units CPEP TAKE 3 CAPSULES THREE TIMES DAILY WITH MEALS AND ONE CAPSULE WITH SNACK TWICE DAILY  . ezetimibe (ZETIA) 10 MG tablet TAKE 1 TABLET BY MOUTH EVERY DAY  . fluticasone (FLONASE) 50 MCG/ACT nasal spray Place 2 sprays into both nostrils daily.  Marland Kitchen guaiFENesin (MUCINEX) 600 MG 12 hr tablet Take 600 mg by mouth daily.   . hyoscyamine (LEVSIN SL) 0.125 MG SL tablet USE 1 TABLET UNDER THE TONGUE BEFORE MEALS AND AT BEDTIME AS NEEDED FOR FLARES IN SYMPTOMS  . imipramine (TOFRANIL) 10 MG tablet TAKE 1 TABLET BY MOUTH EVERYDAY AT BEDTIME  . losartan (COZAAR) 50 MG tablet Take 1 tablet (50 mg total) by mouth every evening.  . meclizine (ANTIVERT) 25 MG tablet TAKE 1 TABLET (25 MG TOTAL) BY MOUTH 3 (THREE) TIMES DAILY AS NEEDED FOR DIZZINESS OR NAUSEA. (Patient taking differently: as needed. TAKE 1 TABLET (25 MG TOTAL) BY MOUTH 3 (THREE) TIMES DAILY AS NEEDED FOR DIZZINESS OR NAUSEA.)  . metFORMIN (GLUCOPHAGE) 500 MG tablet TAKE ONE TABLET BY MOUTH EVERY MORNING ONE AT LUNCH AND 2 AT BEDTIME (Patient taking differently: Take 500 mg by mouth in the morning and at bedtime. TAKE ONE TABLET BY MOUTH EVERY MORNING and  ONE or 2 AT BEDTIME)  . methenamine (HIPREX) 1 g tablet Take 1 tablet (1 g total) by mouth 2 (two) times daily.  . mirabegron ER (MYRBETRIQ) 25 MG TB24 tablet Take 50 mg every morning by mouth.   . Multiple  Vitamins-Minerals (PRESERVISION AREDS 2) CAPS Take 1 capsule by mouth 2 (two) times daily.   . nateglinide (STARLIX) 120 MG tablet TAKE 1 TABLET BY MOUTH 3 TIMES A DAY BEFORE MEALS  . NON FORMULARY Phillips probiotic  Once a day  . omega-3 acid ethyl esters (LOVAZA) 1 g capsule Take 3 g by mouth daily.   Marland Kitchen OVER THE COUNTER MEDICATION Vitamin D     One a day  Not sure strength  . Vitamin Mixture (ESTER-C) 500-60 MG TABS Take 1 tablet by mouth daily.   . [DISCONTINUED] cefUROXime (CEFTIN)  250 MG tablet Take 1 tablet (250 mg total) by mouth 2 (two) times daily with a meal.   No facility-administered encounter medications on file as of 01/18/2020.     Review of Systems  Constitutional: Positive for chills. Negative for fever.  HENT: Positive for congestion, rhinorrhea, sinus pressure and sinus pain.   Respiratory: Positive for cough.      Vitals Pulse 100   Temp 98.8 F (37.1 C)   Resp 16   SpO2 98%   Objective:   Physical Exam Vitals and nursing note reviewed.  Constitutional:      General: She is not in acute distress.    Appearance: She is not ill-appearing or toxic-appearing.  HENT:     Right Ear: Tympanic membrane normal.     Left Ear: Tympanic membrane normal.     Nose: Congestion and rhinorrhea present.     Right Sinus: Maxillary sinus tenderness and frontal sinus tenderness present.     Left Sinus: Maxillary sinus tenderness and frontal sinus tenderness present.     Mouth/Throat:     Mouth: Mucous membranes are moist.     Pharynx: Posterior oropharyngeal erythema present.  Cardiovascular:     Rate and Rhythm: Regular rhythm. Tachycardia present.     Heart sounds: Normal heart sounds.  Pulmonary:     Effort: Pulmonary effort is normal.     Breath sounds: Normal breath sounds.  Skin:    General: Skin is warm and dry.  Neurological:     Mental Status: She is alert and oriented to person, place, and time.  Psychiatric:        Mood and Affect: Mood normal.        Behavior: Behavior normal.      Assessment and Plan   1. Acute bacterial rhinosinusitis - azithromycin (ZITHROMAX) 250 MG tablet; Take 2 tablets today by mouth, then one tablet by mouth for four days.  Dispense: 6 tablet; Refill: 0 - Novel Coronavirus, NAA ([facility])  2. Seasonal allergies - fluticasone (FLONASE) 50 MCG/ACT nasal spray; Place 2 sprays into both nostrils daily.  Dispense: 16 g; Refill: 1 - Novel Coronavirus, NAA ([facility])   Has significant frontal and maxillary  sinus pain and pressure, will treat for acute bacterial rhinosinusitis.  She suffers from seasonal allergies, encouraged her to start an allergy OTC medication, and will refill her Flonase.  Agrees with plan of care discussed today. Understands warning signs to seek further care: Fever chills, chest pain, shortness of breath, any significant change in health status. Understands to follow-up if symptoms do not improve, or worsen.  Will notify once Covid results are available.   Pecolia Ades, FNP-C  01/18/2020

## 2020-01-19 ENCOUNTER — Other Ambulatory Visit: Payer: Self-pay | Admitting: Urology

## 2020-01-19 ENCOUNTER — Ambulatory Visit: Payer: Medicare Other

## 2020-01-20 ENCOUNTER — Ambulatory Visit: Payer: Medicare Other | Admitting: Student

## 2020-01-20 LAB — NOVEL CORONAVIRUS, NAA: SARS-CoV-2, NAA: NOT DETECTED

## 2020-01-20 LAB — SARS-COV-2, NAA 2 DAY TAT

## 2020-01-21 ENCOUNTER — Other Ambulatory Visit: Payer: Self-pay

## 2020-01-21 ENCOUNTER — Telehealth: Payer: Self-pay

## 2020-01-21 ENCOUNTER — Ambulatory Visit: Payer: BC Managed Care – PPO | Admitting: Family Medicine

## 2020-01-21 DIAGNOSIS — Z87440 Personal history of urinary (tract) infections: Secondary | ICD-10-CM

## 2020-01-21 DIAGNOSIS — N3010 Interstitial cystitis (chronic) without hematuria: Secondary | ICD-10-CM

## 2020-01-21 MED ORDER — MIRABEGRON ER 25 MG PO TB24
25.0000 mg | ORAL_TABLET | Freq: Two times a day (BID) | ORAL | 11 refills | Status: DC
Start: 2020-01-21 — End: 2020-03-16

## 2020-01-21 MED ORDER — METHENAMINE HIPPURATE 1 G PO TABS: 1.0000 g | ORAL_TABLET | Freq: Two times a day (BID) | ORAL | 3 refills | Status: DC

## 2020-01-21 NOTE — Telephone Encounter (Signed)
Please advise. Thank you

## 2020-01-21 NOTE — Telephone Encounter (Signed)
I recommend that she increase her fluid intake to avoid dehydration. Is she running a fever- and that's why she feels cold and sweating? She can alternate Tylenol and Ibuprofen as directed on bottle for fever and discomfort. STAY hydrated!!! Thanks, [employer]

## 2020-01-21 NOTE — Telephone Encounter (Signed)
Left message to return call 

## 2020-01-21 NOTE — Telephone Encounter (Signed)
Spoke with Pt  verbalized and understood.

## 2020-01-21 NOTE — Telephone Encounter (Signed)
Pt was seen Tuesday by Luella Cook test come back negative her urine is real dark. She stays cold when waking up her head is soak from sweat. She said she is inproving but still stuffy nose and sweating  Call back [phone]

## 2020-01-24 ENCOUNTER — Telehealth: Payer: Self-pay

## 2020-01-24 ENCOUNTER — Other Ambulatory Visit: Payer: Self-pay | Admitting: *Deleted

## 2020-01-24 DIAGNOSIS — Z79899 Other long term (current) drug therapy: Secondary | ICD-10-CM

## 2020-01-24 DIAGNOSIS — I10 Essential (primary) hypertension: Secondary | ICD-10-CM

## 2020-01-24 DIAGNOSIS — E785 Hyperlipidemia, unspecified: Secondary | ICD-10-CM

## 2020-01-24 DIAGNOSIS — E1100 Type 2 diabetes mellitus with hyperosmolarity without nonketotic hyperglycemic-hyperosmolar coma (NKHHC): Secondary | ICD-10-CM

## 2020-01-24 NOTE — Telephone Encounter (Signed)
Patient needing labs done has physical 12/17

## 2020-01-24 NOTE — Telephone Encounter (Signed)
Lmtc. Labs ordered.

## 2020-01-24 NOTE — Telephone Encounter (Signed)
Pt returned call and verbalized understanding  

## 2020-01-25 ENCOUNTER — Ambulatory Visit (INDEPENDENT_AMBULATORY_CARE_PROVIDER_SITE_OTHER): Payer: Medicare Other

## 2020-01-25 ENCOUNTER — Other Ambulatory Visit: Payer: Self-pay

## 2020-01-25 DIAGNOSIS — N3010 Interstitial cystitis (chronic) without hematuria: Secondary | ICD-10-CM

## 2020-01-25 LAB — URINALYSIS, ROUTINE W REFLEX MICROSCOPIC
Bilirubin, UA: NEGATIVE
Glucose, UA: NEGATIVE
Ketones, UA: NEGATIVE
Leukocytes,UA: NEGATIVE
Nitrite, UA: NEGATIVE
Protein,UA: NEGATIVE
RBC, UA: NEGATIVE
Specific Gravity, UA: 1.015 (ref 1.005–1.030)
Urobilinogen, Ur: 0.2 mg/dL (ref 0.2–1.0)
pH, UA: 7 (ref 5.0–7.5)

## 2020-01-25 MED ORDER — LIDOCAINE HCL 2 % IJ SOLN
10.0000 mL | Freq: Once | INTRAMUSCULAR | Status: AC
  Administered 2020-01-25: 200 mg

## 2020-01-25 MED ORDER — SODIUM BICARBONATE 8.4 % IV SOLN
50.0000 meq | Freq: Once | INTRAVENOUS | Status: AC
  Administered 2020-01-25: 50 meq via INTRAVENOUS

## 2020-01-25 MED ORDER — DIMETHYL SULFOXIDE 50 % IS SOLN
50.0000 mL | Freq: Once | INTRAVESICAL | Status: AC
  Administered 2020-01-25: 50 mL via URETHRAL

## 2020-01-25 MED ORDER — HYDROCORTISONE NA SUCCINATE PF 100 MG IJ SOLR
100.0000 mg | Freq: Once | INTRAMUSCULAR | Status: AC
  Administered 2020-01-25: 100 mg

## 2020-01-25 NOTE — Progress Notes (Signed)
Bladder Instillation  Due to IC patient is present today for a Bladder Instillation of DMSO. Patient was cleaned and prepped in a sterile fashion with betadine and lidocaine 2% jelly was instilled into the urethra.  A 18FR catheter was inserted, urine return was noted 120ml, urine was yellow in color.  DMSO ml was instilled into the bladder. The catheter was then removed. Patient tolerated well, no complications were noted Pt held in bladder for 20 minutes and then catheter was removed. Performed by: Hope, LPN  Follow up/ Additional notes: Keep next scheduled NV

## 2020-01-27 DIAGNOSIS — I10 Essential (primary) hypertension: Secondary | ICD-10-CM | POA: Diagnosis not present

## 2020-01-27 DIAGNOSIS — E785 Hyperlipidemia, unspecified: Secondary | ICD-10-CM | POA: Diagnosis not present

## 2020-01-27 DIAGNOSIS — E1100 Type 2 diabetes mellitus with hyperosmolarity without nonketotic hyperglycemic-hyperosmolar coma (NKHHC): Secondary | ICD-10-CM | POA: Diagnosis not present

## 2020-01-27 DIAGNOSIS — Z79899 Other long term (current) drug therapy: Secondary | ICD-10-CM | POA: Diagnosis not present

## 2020-01-28 LAB — CMP14+EGFR
ALT: 15 IU/L (ref 0–32)
AST: 13 IU/L (ref 0–40)
Albumin/Globulin Ratio: 1.8 (ref 1.2–2.2)
Albumin: 4.6 g/dL (ref 3.6–4.6)
Alkaline Phosphatase: 82 IU/L (ref 44–121)
BUN/Creatinine Ratio: 31 — ABNORMAL HIGH (ref 12–28)
BUN: 23 mg/dL (ref 8–27)
Bilirubin Total: 0.3 mg/dL (ref 0.0–1.2)
CO2: 24 mmol/L (ref 20–29)
Calcium: 10.4 mg/dL — ABNORMAL HIGH (ref 8.7–10.3)
Chloride: 103 mmol/L (ref 96–106)
Creatinine, Ser: 0.74 mg/dL (ref 0.57–1.00)
GFR calc Af Amer: 86 mL/min/{1.73_m2} (ref >59)
GFR calc non Af Amer: 75 mL/min/{1.73_m2} (ref >59)
Globulin, Total: 2.5 g/dL (ref 1.5–4.5)
Glucose: 141 mg/dL — ABNORMAL HIGH (ref 65–99)
Potassium: 5.6 mmol/L — ABNORMAL HIGH (ref 3.5–5.2)
Sodium: 140 mmol/L (ref 134–144)
Total Protein: 7.1 g/dL (ref 6.0–8.5)

## 2020-01-28 LAB — LIPID PANEL
Chol/HDL Ratio: 2.7 ratio (ref 0.0–4.4)
Cholesterol, Total: 189 mg/dL (ref 100–199)
HDL: 70 mg/dL (ref >39)
LDL Chol Calc (NIH): 103 mg/dL — ABNORMAL HIGH (ref 0–99)
Triglycerides: 90 mg/dL (ref 0–149)
VLDL Cholesterol Cal: 16 mg/dL (ref 5–40)

## 2020-01-28 LAB — HEPATIC FUNCTION PANEL: Bilirubin, Direct: 0.11 mg/dL (ref 0.00–0.40)

## 2020-01-28 LAB — HEMOGLOBIN A1C
Est. average glucose Bld gHb Est-mCnc: 134 mg/dL
Hgb A1c MFr Bld: 6.3 % — ABNORMAL HIGH (ref 4.8–5.6)

## 2020-02-01 DIAGNOSIS — H34832 Tributary (branch) retinal vein occlusion, left eye, with macular edema: Secondary | ICD-10-CM | POA: Diagnosis not present

## 2020-02-03 DIAGNOSIS — L280 Lichen simplex chronicus: Secondary | ICD-10-CM | POA: Diagnosis not present

## 2020-02-03 DIAGNOSIS — L218 Other seborrheic dermatitis: Secondary | ICD-10-CM | POA: Diagnosis not present

## 2020-02-03 DIAGNOSIS — L304 Erythema intertrigo: Secondary | ICD-10-CM | POA: Diagnosis not present

## 2020-02-04 ENCOUNTER — Other Ambulatory Visit: Payer: Self-pay

## 2020-02-04 ENCOUNTER — Encounter: Payer: Self-pay | Admitting: Nurse Practitioner

## 2020-02-04 ENCOUNTER — Ambulatory Visit (INDEPENDENT_AMBULATORY_CARE_PROVIDER_SITE_OTHER): Payer: Medicare Other | Admitting: Nurse Practitioner

## 2020-02-04 VITALS — BP 112/70 | HR 112 | Temp 97.2°F | Wt 142.8 lb

## 2020-02-04 DIAGNOSIS — E875 Hyperkalemia: Secondary | ICD-10-CM

## 2020-02-04 DIAGNOSIS — Z0000 Encounter for general adult medical examination without abnormal findings: Secondary | ICD-10-CM

## 2020-02-04 DIAGNOSIS — I10 Essential (primary) hypertension: Secondary | ICD-10-CM | POA: Diagnosis not present

## 2020-02-04 NOTE — Progress Notes (Signed)
Subjective:    Patient ID: Deborah Gray, female    DOB: October 23, 1933, 86 y.o.   MRN: [id number]  HPI  AWV- Annual Wellness Visit  The patient was seen for their annual wellness visit. The patient's past medical history, surgical history, and family history were reviewed. Pertinent vaccines were reviewed ( tetanus, pneumonia, shingles, flu) The patient's medication list was reviewed and updated.  The height and weight were entered.  BMI recorded in electronic record elsewhere  Cognitive screening was completed. Outcome of Mini - Cog: pass   Falls /depression screening electronically recorded within record elsewhere  Current tobacco usage: none (All patients who use tobacco were given written and verbal information on quitting)  Recent listing of emergency department/hospitalizations over the past year were reviewed. current specialist the patient sees on a regular basis: Eye doctor and dentist.  Patient would like to discuss her fish oil supplement.    Medicare annual wellness visit patient questionnaire was reviewed. A written screening schedule for the patient for the next 5-10 years was given. Appropriate discussion of follow up regarding next visit was discussed. Discussed recent labs. No changes in medications, supplements or intake of high potassium foods.    Review of Systems  General: Denies fever, HA, or malaise HENT: Denies changes in hearing or vision. Has hx of macular degeneration, seeing a specialist for this.   CV: Denies chest pain, palpitations, tachycardia.  Pulm: Denies cough, SOB, or wheezing GI/GU: Denies upset stomach, reflux, nausea, vomiting, diarrhea, or constipation. Pt has interstitial cystitis. Reports decreased urinary urgency and frequency since taking Methenamine. Denies dysuria, pelvic or flank pain. No GU rashes, lesions or itching.   [place] Office Visit from 02/04/2020 in [facility]  PHQ-2 Total Score 0         Objective:   Physical Exam General: Patient is alert, well groomed, making good eye contact, with coherent thought and judgement. In no acute distress. Smiling and cheerful.   CV: Hearts sounds are normal without murmur or gallop. Regular rate and rhythm. Skin is warm and dry,  +1 edema to bilateral lower extremities. Varicose veins present in RLE. (has seen specialist for this)  Pulm: Breath sounds are equal and clear bilaterally without wheezing or rhonchi.    GI/GU: Abdomen is flat and soft without mass, tenderness. No rebound or guarding. Pt defers GU and breast exam. Denies any concerns at this time.  Vitals:   02/04/20 1503  BP: 112/70  Pulse: (!) 112  Temp: (!) 97.2 F (36.2 C)  SpO2: 98%   Diabetic Foot Exam - Simple   Simple Foot Form Diabetic Foot exam was performed with the following findings: Yes 02/04/2020  3:35 PM  Visual Inspection No deformities, no ulcerations, no other skin breakdown bilaterally: Yes Sensation Testing Intact to touch and monofilament testing bilaterally: Yes Pulse Check Posterior Tibialis and Dorsalis pulse intact bilaterally: Yes Comments    See labs from 01/27/20. Reviewed with patient.       Assessment & Plan:   Problem List Items Addressed This Visit      Cardiovascular and Mediastinum   Essential hypertension   Relevant Orders   Potassium    Other Visit Diagnoses    Encounter for annual wellness exam in Medicare patient    -  Primary   Hyperkalemia           Obtain repeat Potassium lab work early next week.  Encouraged Covid booster and Tdap vaccine at her local pharmacy.  Continue weight bearing exercise such as walking. Continue follow up with her specialists.  Return in about 6 months (around 08/04/2020).

## 2020-02-06 ENCOUNTER — Other Ambulatory Visit: Payer: Self-pay | Admitting: Physician Assistant

## 2020-02-06 ENCOUNTER — Encounter: Payer: Self-pay | Admitting: Nurse Practitioner

## 2020-02-06 NOTE — Progress Notes (Signed)
   Subjective:    Patient ID: Deborah Gray, female    DOB: 1933/12/14, 86 y.o.   MRN: [id number]  HPI    Review of Systems     Objective:   Physical Exam        Assessment & Plan:

## 2020-02-06 NOTE — Progress Notes (Deleted)
Cardiology Office Note   Date:  02/06/2020   ID:  Deborah Gray, DOB December 23, 1933, MRN [id number]  PCP:  Erven Colla, DO  Cardiologist:  Dr. Letta Moynahan    No chief complaint on file.     History of Present Illness: Deborah Gray is a 86 y.o. female who presents for ***  past medical history of chest pain (negative stress testing in 2006, 2015 and 07/2015), pSVT and HTN who presents to the office today for Emergency Department follow-up.   Last seen 07/08/19 post hospital.    She most recently presented to [facility] ED on 05/25/2019 for evaluation of worsening dyspnea and weakness. Also reported intermittent episodes of chest discomfort for the past 2 weeks which was typically worse after food consumption. Lab work showed WBC 8.0, Hgb 13.9, platelets 261, Na+ 139, K+ 3.5 and creatinine 0.66. Initial and delta HS Troponin values were negative at 5. EKG showed NSR, HR 91 with no acute ST abnormalities when compared to prior tracings. Her pain was thought to be likely of a GI etiology and outpatient Cardiology follow-up was recommended.  In talking with the patient today, it is challenging to obtain her recent history as she alternates between talking about more recent events and symptoms that she had over a year and a half ago. Her husband of 82 years passed away last summer and she reports worsening lower extremity weakness since then. On the day of ED evaluation, she did have pressure along her epigastric and sternal region which occurred when walking around her home. Symptoms resolved with rest. Was not associated with food consumption and felt different from her prior GERD. She does report worsening dyspnea on exertion over the past few months as well.  No orthopnea, PND or lower extremity edema.  nuc study normal study EF 55-65%  Today   Past Medical History:  Diagnosis Date  . Arthritis   . Benign neoplasm of colon   . Constipation   . Diaphragmatic hernia without  mention of obstruction or gangrene   . Diverticulosis of colon (without mention of hemorrhage)   . Dysphagia, pharyngoesophageal phase   . Esophageal reflux   . Essential hypertension   . Heart murmur   . Hemorrhoids   . Hyperlipidemia   . Internal hemorrhoids without mention of complication   . Left wrist fracture   . PONV (postoperative nausea and vomiting)   . Stricture and stenosis of esophagus   . Type 2 diabetes mellitus ([place])   . Unspecified gastritis and gastroduodenitis without mention of hemorrhage     Past Surgical History:  Procedure Laterality Date  . ABDOMINAL HYSTERECTOMY    . BACTERIAL OVERGROWTH TEST N/A 09/06/2015   Procedure: BACTERIAL OVERGROWTH TEST;  Surgeon: Daneil Dolin, MD;  Location: [facility];  Service: Endoscopy;  Laterality: N/A;  800  . BREAST LUMPECTOMY Right    benign  . CATARACT EXTRACTION Bilateral 2006   Implants in both, surgeries done 6 weeks apart  . CHOLECYSTECTOMY    . COLONOSCOPY  03/08/02   [name]: diverticulosis, internal hemorrhoids, adenomatous colon polyp  . COLONOSCOPY  01/23/04   [name]: diverticulosis, internal hemorrhoids  . COLONOSCOPY  09/25/05   [name]: diverticulosis  . COLONOSCOPY  07/05/09   [name]: severe diverticulosis  . COLONOSCOPY  01/21/11   [name]: severe diverticulosis in sigmoid to desc colon, int hemorrhoids, follow up TCS in 5 years  . COLONOSCOPY N/A 04/10/2016   [name]: diverticulosis  . DILATION AND CURETTAGE  OF UTERUS    . ESOPHAGEAL MANOMETRY  03/21/08   Patterson: findings c/w [place] Esophagus  . ESOPHAGOGASTRODUODENOSCOPY  03/08/02   [name]: esophageal stricture, chronic gerd, s/p dilation.   . ESOPHAGOGASTRODUODENOSCOPY  01/23/04   [name]: esophageal stricture, gastritis, hiatal hernia  . ESOPHAGOGASTRODUODENOSCOPY  09/25/05   Patterson: gastritis, benign bx, no H.pylori  . ESOPHAGOGASTRODUODENOSCOPY  07/05/09   Patterson: gastropathy, benign small bowel bx and gastric bx  .  ESOPHAGOGASTRODUODENOSCOPY N/A 05/15/2015   Dr. Gala Romney- diffuse moderate inflammation characterized by congestion (edema), erythema, and linear erosions was found in the entire examined stomach. bx= reactive gastropathy  . FOOT SURGERY Left   . HEMORRHOID BANDING  2017   Dr.[name]  . LAPAROSCOPIC VAGINAL HYSTERECTOMY    . ORIF WRIST FRACTURE Left 06/23/2018   Procedure: OPEN REDUCTION INTERNAL FIXATION (ORIF) LEFT WRIST FRACTURE;  Surgeon: Renette Butters, MD;  Location: [place];  Service: Orthopedics;  Laterality: Left;  regional arm block  . RECTOCELE REPAIR    . TOTAL KNEE ARTHROPLASTY Right      Current Outpatient Medications  Medication Sig Dispense Refill  . acetaminophen (TYLENOL) 500 MG tablet Take 500 mg at bedtime by mouth.     Marland Kitchen aspirin EC 81 MG tablet Take 81 mg by mouth at bedtime.    Marland Kitchen azelastine (ASTELIN) 0.1 % nasal spray Place 1 spray into both nostrils 2 (two) times daily as needed for rhinitis. 30 mL 5  . blood glucose meter kit and supplies KIT Dispense based on  insurance preference. Tests once a day E11.9 1 each 0  . Carboxymethylcellulose Sodium (THERATEARS) 0.25 % SOLN Apply 1 drop to eye 2 (two) times daily.     . clidinium-chlordiazePOXIDE (LIBRAX) 5-2.5 MG capsule Take 1 capsule by mouth 4 (four) times daily as needed. 120 capsule 3  . CREON 24000-76000 units CPEP TAKE 3 CAPSULES THREE TIMES DAILY WITH MEALS AND ONE CAPSULE WITH SNACK TWICE DAILY 990 capsule 2  . ezetimibe (ZETIA) 10 MG tablet TAKE 1 TABLET BY MOUTH EVERY DAY 90 tablet 3  . fluticasone (FLONASE) 50 MCG/ACT nasal spray Place 2 sprays into both nostrils daily. 16 g 1  . guaiFENesin (MUCINEX) 600 MG 12 hr tablet Take 600 mg by mouth daily.     . hyoscyamine (LEVSIN SL) 0.125 MG SL tablet USE 1 TABLET UNDER THE TONGUE BEFORE MEALS AND AT BEDTIME AS NEEDED FOR FLARES IN SYMPTOMS 90 tablet 1  . imipramine (TOFRANIL) 10 MG tablet TAKE 1 TABLET BY MOUTH EVERYDAY AT BEDTIME 90 tablet 3  .  losartan (COZAAR) 50 MG tablet Take 1 tablet (50 mg total) by mouth every evening. 90 tablet 3  . meclizine (ANTIVERT) 25 MG tablet TAKE 1 TABLET (25 MG TOTAL) BY MOUTH 3 (THREE) TIMES DAILY AS NEEDED FOR DIZZINESS OR NAUSEA. (Patient taking differently: as needed. TAKE 1 TABLET (25 MG TOTAL) BY MOUTH 3 (THREE) TIMES DAILY AS NEEDED FOR DIZZINESS OR NAUSEA.) 30 tablet 3  . metFORMIN (GLUCOPHAGE) 500 MG tablet TAKE ONE TABLET BY MOUTH EVERY MORNING ONE AT LUNCH AND 2 AT BEDTIME (Patient taking differently: Take 500 mg by mouth in the morning and at bedtime. TAKE ONE TABLET BY MOUTH EVERY MORNING and  ONE or 2 AT BEDTIME) 360 tablet 1  . methenamine (HIPREX) 1 g tablet Take 1 tablet (1 g total) by mouth 2 (two) times daily. 180 tablet 3  . mirabegron ER (MYRBETRIQ) 25 MG TB24 tablet Take 1 tablet (25 mg total) by  mouth in the morning and at bedtime. 60 tablet 11  . Multiple Vitamins-Minerals (PRESERVISION AREDS 2) CAPS Take 1 capsule by mouth 2 (two) times daily.     Marland Kitchen MYRBETRIQ 50 MG TB24 tablet TAKE 1 TABLET BY MOUTH EVERY DAY 90 tablet 3  . nateglinide (STARLIX) 120 MG tablet TAKE 1 TABLET BY MOUTH 3 TIMES A DAY BEFORE MEALS 270 tablet 1  . NON FORMULARY Phillips probiotic  Once a day    . omega-3 acid ethyl esters (LOVAZA) 1 g capsule Take 3 g by mouth daily.     Marland Kitchen OVER THE COUNTER MEDICATION Vitamin D     One a day  Not sure strength    . Vitamin Mixture (ESTER-C) 500-60 MG TABS Take 1 tablet by mouth daily.      No current facility-administered medications for this visit.    Allergies:   Adhesive [tape], Estrogens, and Sulfonamide derivatives    Social History:  The patient  reports that she has never smoked. She has never used smokeless tobacco. She reports that she does not drink alcohol and does not use drugs.   Family History:  The patient's ***family history includes Breast cancer in her sister; Colon cancer in her cousin; Diabetes in her father and maternal aunt; Heart disease in her  father; Liver cancer in her sister; Liver disease in her cousin; Ovarian cancer in her mother.    ROS:  General:no colds or fevers, no weight changes Skin:no rashes or ulcers HEENT:no blurred vision, no congestion CV:see HPI PUL:see HPI GI:no diarrhea constipation or melena, no indigestion GU:no hematuria, no dysuria MS:no joint pain, no claudication Neuro:no syncope, no lightheadedness Endo:no diabetes, no thyroid disease Wt Readings from Last 3 Encounters:  02/04/20 142 lb 12.8 oz (64.8 kg)  08/02/19 137 lb (62.1 kg)  07/05/19 139 lb 9.6 oz (63.3 kg)     PHYSICAL EXAM: VS:  There were no vitals taken for this visit. , BMI There is no height or weight on file to calculate BMI. General:Pleasant affect, NAD Skin:Warm and dry, brisk capillary refill HEENT:normocephalic, sclera clear, mucus membranes moist Neck:supple, no JVD, no bruits  Heart:S1S2 RRR without murmur, gallup, rub or click Lungs:clear without rales, rhonchi, or wheezes DVV:OHYW, non tender, + BS, do not palpate liver spleen or masses Ext:no lower ext edema, 2+ pedal pulses, 2+ radial pulses Neuro:alert and oriented, MAE, follows commands, + facial symmetry    EKG:  EKG is ordered today. The ekg ordered today demonstrates ***   Recent Labs: 05/25/2019: Hemoglobin 13.9; Platelets 261 01/27/2020: ALT 15; BUN 23; Creatinine, Ser 0.74; Potassium 5.6; Sodium 140    Lipid Panel    Component Value Date/Time   CHOL 189 01/27/2020 1057   TRIG 90 01/27/2020 1057   HDL 70 01/27/2020 1057   CHOLHDL 2.7 01/27/2020 1057   CHOLHDL 3.6 11/22/2013 0852   VLDL 29 11/22/2013 0852   LDLCALC 103 (H) 01/27/2020 1057       Other studies Reviewed: Additional studies/ records that were reviewed today include: ***.   ASSESSMENT AND PLAN:  1.  ***   Current medicines are reviewed with the patient today.  The patient Has no concerns regarding medicines.  The following changes have been made:  See above Labs/ tests  ordered today include:see above  Disposition:   FU:  see above  Signed, Cecilie Kicks, NP  02/06/2020 10:08 PM    [facility] [place], [place], [place] [address]  [place], [place] Phone: [phone]; Fax: [phone]

## 2020-02-07 ENCOUNTER — Ambulatory Visit: Payer: Medicare Other | Admitting: Cardiology

## 2020-02-07 ENCOUNTER — Telehealth: Payer: Self-pay

## 2020-02-07 DIAGNOSIS — I10 Essential (primary) hypertension: Secondary | ICD-10-CM | POA: Diagnosis not present

## 2020-02-07 NOTE — Telephone Encounter (Signed)
Left message to return call 

## 2020-02-07 NOTE — Telephone Encounter (Signed)
Pt was calling Hoyle Sauer back to let her know that the only Medication that she did not list last week that she tool was cetiricine hcl 10mg  take 1 a day for itching from dermatology and take a lot of over the counter Mucincex up to three a day. And Splenda is only other thing she has consumed a bunch of.  Pt call back [phone]

## 2020-02-07 NOTE — Telephone Encounter (Signed)
Tell Ms. [place] thanks for the information. I reviewed all 3 and elevation in potassium was not mentioned. As a side note, Cetirizine (Zyrtec) should be used cautiously in older adults. I have also seen it affect the bladder but if she is doing fine, it should not be an issue. Just let her urologist know the next time she sees them. Thanks.

## 2020-02-08 ENCOUNTER — Telehealth: Payer: Self-pay

## 2020-02-08 LAB — POTASSIUM: Potassium: 4.5 mmol/L (ref 3.5–5.2)

## 2020-02-08 NOTE — Telephone Encounter (Signed)
Patient notified and stated she will inform her urologist of her new med

## 2020-02-09 ENCOUNTER — Other Ambulatory Visit: Payer: Self-pay | Admitting: Family Medicine

## 2020-02-09 ENCOUNTER — Other Ambulatory Visit: Payer: Self-pay

## 2020-02-09 ENCOUNTER — Telehealth: Payer: Self-pay

## 2020-02-09 ENCOUNTER — Other Ambulatory Visit: Payer: Medicare Other

## 2020-02-09 DIAGNOSIS — J302 Other seasonal allergic rhinitis: Secondary | ICD-10-CM

## 2020-02-09 DIAGNOSIS — N3010 Interstitial cystitis (chronic) without hematuria: Secondary | ICD-10-CM

## 2020-02-09 LAB — MICROSCOPIC EXAMINATION
RBC, Urine: >30 /hpf — AB (ref 0–2)
Renal Epithel, UA: NONE SEEN /hpf

## 2020-02-09 LAB — URINALYSIS, ROUTINE W REFLEX MICROSCOPIC
Bilirubin, UA: NEGATIVE
Glucose, UA: NEGATIVE
Ketones, UA: NEGATIVE
Nitrite, UA: NEGATIVE
Specific Gravity, UA: 1.02 (ref 1.005–1.030)
Urobilinogen, Ur: 0.2 mg/dL (ref 0.2–1.0)
pH, UA: 7 (ref 5.0–7.5)

## 2020-02-09 MED ORDER — CEFUROXIME AXETIL 250 MG PO TABS: 250.0000 mg | ORAL_TABLET | Freq: Two times a day (BID) | ORAL | 0 refills | Status: DC

## 2020-02-09 NOTE — Telephone Encounter (Signed)
Abt sent into pharmacy per Dr. Alyson Ingles. Awaiting culture results.

## 2020-02-10 NOTE — Telephone Encounter (Signed)
Neither should cause increased potassium

## 2020-02-13 NOTE — Progress Notes (Signed)
Cardiology Office Note  Date: 02/14/2020   ID: Deborah Gray, DOB Apr 05, 1933, MRN [id number]  PCP:  Erven Colla, DO  Cardiologist:  No primary care provider on file. Electrophysiologist:  None   Chief Complaint: Cardiac follow up.  History of Present Illness: Deborah Gray is a 86 y.o. female with a history of SVT, HTN, DM2, HLD, esophageal stricture and stenosis, dysphagia, diverticulosis. Hx of palpitations  Lat encounter with Bernerd Pho PA,  After recent [facility] ED visit 06/04/2019. Visit  follow up secondary to complaints of worsening dyspnea and weakness with intermittent episodes of chest discomfort. CP worse after food intake for  The prior 2 weeks. Troponins were negative x 2. Her pain was thought likely due to GI issues. At follow up she complained of chest discomfort with activity and improvement with rest. Lexiscan stress test was ordered. She denied any recent palpitations. No currently on BB therapy. BP well controlled on Losartan. Currently on Zetia and ASA for hyperlipidemia.   She is here for 59-month follow-up today.  She denies any recent hospitalizations or acute illnesses.  States she has been having some itching over the last few years.  She states she was told losartan could cause itching.  She is asking if possibly she could be switched to another medication for her blood pressure management.  Otherwise she complains of some chronic dyspnea which seems to be worsening to some degree.  She states she has a history of rheumatic heart disease..  She denies any orthostatic symptoms, CVA or TIA-like symptoms, palpitations or arrhythmias, orthostatic symptoms, PND, orthopnea, claudication-like symptoms, DVT or PE-like symptoms.  She does have some chronic lower extremity edema and venous varicosities.  Past Medical History:  Diagnosis Date  . Arthritis   . Benign neoplasm of colon   . Constipation   . Diaphragmatic hernia without mention of obstruction or  gangrene   . Diverticulosis of colon (without mention of hemorrhage)   . Dysphagia, pharyngoesophageal phase   . Esophageal reflux   . Essential hypertension   . Heart murmur   . Hemorrhoids   . Hyperlipidemia   . Internal hemorrhoids without mention of complication   . Left wrist fracture   . PONV (postoperative nausea and vomiting)   . Stricture and stenosis of esophagus   . Type 2 diabetes mellitus ([place])   . Unspecified gastritis and gastroduodenitis without mention of hemorrhage     Past Surgical History:  Procedure Laterality Date  . ABDOMINAL HYSTERECTOMY    . BACTERIAL OVERGROWTH TEST N/A 09/06/2015   Procedure: BACTERIAL OVERGROWTH TEST;  Surgeon: Daneil Dolin, MD;  Location: [facility];  Service: Endoscopy;  Laterality: N/A;  800  . BREAST LUMPECTOMY Right    benign  . CATARACT EXTRACTION Bilateral 2006   Implants in both, surgeries done 6 weeks apart  . CHOLECYSTECTOMY    . COLONOSCOPY  03/08/02   [name]: diverticulosis, internal hemorrhoids, adenomatous colon polyp  . COLONOSCOPY  01/23/04   [name]: diverticulosis, internal hemorrhoids  . COLONOSCOPY  09/25/05   [name]: diverticulosis  . COLONOSCOPY  07/05/09   [name]: severe diverticulosis  . COLONOSCOPY  01/21/11   [name]: severe diverticulosis in sigmoid to desc colon, int hemorrhoids, follow up TCS in 5 years  . COLONOSCOPY N/A 04/10/2016   [name]: diverticulosis  . DILATION AND CURETTAGE OF UTERUS    . ESOPHAGEAL MANOMETRY  03/21/08   [facility]terson: findings c/w [place] Esophagus  . ESOPHAGOGASTRODUODENOSCOPY  03/08/02   [name]:  esophageal stricture, chronic gerd, s/p dilation.   . ESOPHAGOGASTRODUODENOSCOPY  01/23/04   [name]: esophageal stricture, gastritis, hiatal hernia  . ESOPHAGOGASTRODUODENOSCOPY  09/25/05   [facility]terson: gastritis, benign bx, no H.pylori  . ESOPHAGOGASTRODUODENOSCOPY  07/05/09   [facility]: gastropathy, benign small bowel bx and gastric bx  . ESOPHAGOGASTRODUODENOSCOPY N/A  05/15/2015   Dr. Gala Romney- diffuse moderate inflammation characterized by congestion (edema), erythema, and linear erosions was found in the entire examined stomach. bx= reactive gastropathy  . FOOT SURGERY Left   . HEMORRHOID BANDING  2017   Dr.[name]  . LAPAROSCOPIC VAGINAL HYSTERECTOMY    . ORIF WRIST FRACTURE Left 06/23/2018   Procedure: OPEN REDUCTION INTERNAL FIXATION (ORIF) LEFT WRIST FRACTURE;  Surgeon: Renette Butters, MD;  Location: [facility];  Service: Orthopedics;  Laterality: Left;  regional arm block  . RECTOCELE REPAIR    . TOTAL KNEE ARTHROPLASTY Right     Current Outpatient Medications  Medication Sig Dispense Refill  . acetaminophen (TYLENOL) 500 MG tablet Take 500 mg by mouth at bedtime.    Marland Kitchen aspirin EC 81 MG tablet Take 81 mg by mouth at bedtime.    Marland Kitchen azelastine (ASTELIN) 0.1 % nasal spray Place 1 spray into both nostrils 2 (two) times daily as needed for rhinitis. 30 mL 5  . blood glucose meter kit and supplies KIT Dispense based on  insurance preference. Tests once a day E11.9 1 each 0  . Carboxymethylcellulose Sodium (THERATEARS) 0.25 % SOLN Apply 1 drop to eye 2 (two) times daily.     . cefUROXime (CEFTIN) 250 MG tablet Take 1 tablet (250 mg total) by mouth 2 (two) times daily with a meal. 14 tablet 0  . clidinium-chlordiazePOXIDE (LIBRAX) 5-2.5 MG capsule Take 1 capsule by mouth 4 (four) times daily as needed. 120 capsule 3  . CREON 24000-76000 units CPEP TAKE 3 CAPSULES THREE TIMES DAILY WITH MEALS AND ONE CAPSULE WITH SNACK TWICE DAILY 990 capsule 2  . ezetimibe (ZETIA) 10 MG tablet TAKE 1 TABLET BY MOUTH EVERY DAY 90 tablet 3  . fluticasone (FLONASE) 50 MCG/ACT nasal spray SPRAY 2 SPRAYS INTO EACH NOSTRIL EVERY DAY 16 mL 1  . guaiFENesin (MUCINEX) 600 MG 12 hr tablet Take 600 mg by mouth daily.    . hyoscyamine (LEVSIN SL) 0.125 MG SL tablet USE 1 TABLET UNDER THE TONGUE BEFORE MEALS AND AT BEDTIME AS NEEDED FOR FLARES IN SYMPTOMS 90 tablet 1  .  imipramine (TOFRANIL) 10 MG tablet TAKE 1 TABLET BY MOUTH EVERYDAY AT BEDTIME 90 tablet 3  . lisinopril (ZESTRIL) 20 MG tablet Take 1 tablet (20 mg total) by mouth daily. 90 tablet 3  . meclizine (ANTIVERT) 25 MG tablet TAKE 1 TABLET (25 MG TOTAL) BY MOUTH 3 (THREE) TIMES DAILY AS NEEDED FOR DIZZINESS OR NAUSEA. (Patient taking differently: as needed. TAKE 1 TABLET (25 MG TOTAL) BY MOUTH 3 (THREE) TIMES DAILY AS NEEDED FOR DIZZINESS OR NAUSEA.) 30 tablet 3  . metFORMIN (GLUCOPHAGE) 500 MG tablet TAKE ONE TABLET BY MOUTH EVERY MORNING ONE AT LUNCH AND 2 AT BEDTIME (Patient taking differently: Take 500 mg by mouth in the morning and at bedtime. TAKE ONE TABLET BY MOUTH EVERY MORNING and  ONE or 2 AT BEDTIME) 360 tablet 1  . mirabegron ER (MYRBETRIQ) 25 MG TB24 tablet Take 1 tablet (25 mg total) by mouth in the morning and at bedtime. 60 tablet 11  . Multiple Vitamins-Minerals (PRESERVISION AREDS 2) CAPS Take 1 capsule by mouth 2 (two)  times daily.     Marland Kitchen MYRBETRIQ 50 MG TB24 tablet TAKE 1 TABLET BY MOUTH EVERY DAY 90 tablet 3  . nateglinide (STARLIX) 120 MG tablet TAKE 1 TABLET BY MOUTH 3 TIMES A DAY BEFORE MEALS 270 tablet 1  . NON FORMULARY Phillips probiotic  Once a day    . omega-3 acid ethyl esters (LOVAZA) 1 g capsule Take 3 g by mouth daily.     Marland Kitchen OVER THE COUNTER MEDICATION Vitamin D     One a day  Not sure strength    . Vitamin Mixture (ESTER-C) 500-60 MG TABS Take 1 tablet by mouth daily.     . cetirizine (ZYRTEC) 10 MG tablet Take 10 mg by mouth daily.     No current facility-administered medications for this visit.   Allergies:  Adhesive [tape], Estrogens, and Sulfonamide derivatives   Social History: The patient  reports that she has never smoked. She has never used smokeless tobacco. She reports that she does not drink alcohol and does not use drugs.   Family History: The patient's family history includes Breast cancer in her sister; Colon cancer in her cousin; Diabetes in her father  and maternal aunt; Heart disease in her father; Liver cancer in her sister; Liver disease in her cousin; Ovarian cancer in her mother.   ROS:  Please see the history of present illness. Otherwise, complete review of systems is positive for none.  All other systems are reviewed and negative.   Physical Exam: VS:  BP 140/68   Pulse 82   Ht 5\' 4"  (1.626 m)   Wt 143 lb 3.2 oz (65 kg)   SpO2 97%   BMI 24.58 kg/m , BMI Body mass index is 24.58 kg/m.  Wt Readings from Last 3 Encounters:  02/14/20 143 lb 3.2 oz (65 kg)  02/04/20 142 lb 12.8 oz (64.8 kg)  08/02/19 137 lb (62.1 kg)    General: Patient appears comfortable at rest. Neck: Supple, no elevated JVP or carotid bruits, no thyromegaly. Lungs: Clear to auscultation, nonlabored breathing at rest. Cardiac: Regular rate and rhythm, no S3 or significant systolic murmur, no pericardial rub. Extremities: Mild non-pitting edema with venous varicosities, distal pulses 2+. Skin: Warm and dry. Musculoskeletal: No kyphosis. Neuropsychiatric: Alert and oriented x3, affect grossly appropriate.  ECG:  EKG May 25, 2019: Sinus rhythm rate of 91.  Probable anteroseptal infarct old  Recent Labwork: 05/25/2019: Hemoglobin 13.9; Platelets 261 01/27/2020: ALT 15; AST 13; BUN 23; Creatinine, Ser 0.74; Sodium 140 02/07/2020: Potassium 4.5     Component Value Date/Time   CHOL 189 01/27/2020 1057   TRIG 90 01/27/2020 1057   HDL 70 01/27/2020 1057   CHOLHDL 2.7 01/27/2020 1057   CHOLHDL 3.6 11/22/2013 0852   VLDL 29 11/22/2013 0852   LDLCALC 103 (H) 01/27/2020 1057    Other Studies Reviewed Today:  NST 07/08/2019 Narrative & Impression   There was no ST segment deviation noted during stress.  The study is normal. There are no perfusion defects consistent with prior infarct or current ischemia.  This is a low risk study.  The left ventricular ejection fraction is normal (55-65%).     Echocardiogram: 11/2013 Study Conclusions   - Left  ventricle: The cavity size was normal. Wall thickness was  increased in a pattern of mild LVH. Systolic function was  vigorous. The estimated ejection fraction was in the range of 65%  to 70%. Wall motion was normal; there were no regional wall  motion abnormalities.  Doppler parameters are consistent with  abnormal left ventricular relaxation (grade 1 diastolic  dysfunction). Doppler parameters are consistent with elevated  ventricular end-diastolic filling pressure.  - Aortic valve: Trileaflet; mildly calcified leaflets. Moderate  thickening and calcification involving the left coronary cusp.  There was no significant regurgitation.  - Mitral valve: There was trivial regurgitation.  - Right atrium: Central venous pressure (est): 3 mm Hg.  - Atrial septum: No defect or patent foramen ovale was identified.  - Tricuspid valve: There was trivial regurgitation.  - Pulmonary arteries: PA peak pressure: 32 mm Hg (S).  - Pericardium, extracardiac: There was no pericardial effusion.   Impressions:   - Mild LVH with LVEF 70-01%, grade 1 diastolic dysfunction with  increased filling pressures. Mildly sclerotic aortic valve.  Trivial mitral and tricuspid regurgitation. Upper normal PASP 32  mm mercury.   NST: 07/2015  There was no ST segment deviation noted during stress.  No T wave inversion was noted during stress.  The study is normal.  This is a low risk study.  The left ventricular ejection fraction is hyperdynamic (>65%).   Assessment and Plan:  1. Essential hypertension   2. Mixed hyperlipidemia   3. Dyspnea on exertion    1. Essential hypertension Blood pressure elevated on arrival today at 140/68.  Patient states that she has been on losartan she has been experiencing chronic itching.  She states she was told losartan causes itching.  She would like to switch medications to see if this would relieve the itching and better manage her blood pressure.   Stop losartan.  Start lisinopril 20 mg daily.  In 2 weeks after starting medication get a basic metabolic panel and magnesium.  2. Mixed hyperlipidemia Continue Zetia 10 mg daily.  3. Dyspnea on exertion Planing of increased dyspnea on exertion recently.  States she has a history of rheumatic heart disease.  Please get a repeat echocardiogram  Medication Adjustments/Labs and Tests Ordered: Current medicines are reviewed at length with the patient today.  Concerns regarding medicines are outlined above.   Disposition: Follow-up with Dr. Domenic Polite or APP 1 month  Signed, Levell July, NP 02/14/2020 3:00 PM    [facility], [address], [place] [address] Phone: [phone]; Fax: [phone]

## 2020-02-14 ENCOUNTER — Encounter: Payer: Self-pay | Admitting: Family Medicine

## 2020-02-14 ENCOUNTER — Ambulatory Visit (INDEPENDENT_AMBULATORY_CARE_PROVIDER_SITE_OTHER): Payer: Medicare Other | Admitting: Family Medicine

## 2020-02-14 ENCOUNTER — Other Ambulatory Visit: Payer: Self-pay

## 2020-02-14 VITALS — BP 140/68 | HR 82 | Ht 64.0 in | Wt 143.2 lb

## 2020-02-14 DIAGNOSIS — E782 Mixed hyperlipidemia: Secondary | ICD-10-CM | POA: Diagnosis not present

## 2020-02-14 DIAGNOSIS — I10 Essential (primary) hypertension: Secondary | ICD-10-CM | POA: Diagnosis not present

## 2020-02-14 DIAGNOSIS — R0609 Other forms of dyspnea: Secondary | ICD-10-CM

## 2020-02-14 DIAGNOSIS — R0600 Dyspnea, unspecified: Secondary | ICD-10-CM | POA: Diagnosis not present

## 2020-02-14 MED ORDER — LISINOPRIL 20 MG PO TABS: 20.0000 mg | ORAL_TABLET | Freq: Every day | ORAL | 3 refills | Status: DC

## 2020-02-14 NOTE — Patient Instructions (Addendum)
Medication Instructions:  Your physician has recommended you make the following change in your medication:   STOP: Losartan START: Lisinopril 20mg  daily  *If you need a refill on your cardiac medications before your next appointment, please call your pharmacy*   Lab Work: BMET, [place] in 2 weeks  If you have labs (blood work) drawn today and your tests are completely normal, you will receive your results only by: MyChart Message (if you have MyChart) OR . A paper copy in the mail If you have any lab test that is abnormal or we need to change your treatment, we will call you to review the results.   Testing/Procedures: Your physician has requested that you have an echocardiogram. Echocardiography is a painless test that uses sound waves to create images of your heart. It provides your doctor with information about the size and shape of your heart and how well your heart's chambers and valves are working. This procedure takes approximately one hour. There are no restrictions for this procedure.  Follow-Up: At [facility], you and your health needs are our priority.  As part of our continuing mission to provide you with exceptional heart care, we have created designated Provider Care Teams.  These Care Teams include your primary Cardiologist (physician) and Advanced Practice Providers (APPs -  Physician Assistants and Nurse Practitioners) who all work together to provide you with the care you need, when you need it.  We recommend signing up for the patient portal called "MyChart".  Sign up information is provided on this After Visit Summary.  MyChart is used to connect with patients for Virtual Visits (Telemedicine).  Patients are able to view lab/test results, encounter notes, upcoming appointments, etc.  Non-urgent messages can be sent to your provider as well.   To learn more about what you can do with MyChart, go to [facility].    Your next appointment:   4  week(s)  The format for your next appointment:   In Person  Provider:   [email or web address], NP   Other Instructions Go to your local Pharmacy and buy some Low Grade Compression Stockings

## 2020-02-15 NOTE — Telephone Encounter (Signed)
Pt notified neither med should cause increased Potassium.

## 2020-02-16 ENCOUNTER — Other Ambulatory Visit: Payer: Self-pay | Admitting: Family Medicine

## 2020-02-16 DIAGNOSIS — J302 Other seasonal allergic rhinitis: Secondary | ICD-10-CM

## 2020-02-23 ENCOUNTER — Ambulatory Visit (INDEPENDENT_AMBULATORY_CARE_PROVIDER_SITE_OTHER): Payer: Medicare Other

## 2020-02-23 ENCOUNTER — Other Ambulatory Visit: Payer: Self-pay

## 2020-02-23 DIAGNOSIS — N3010 Interstitial cystitis (chronic) without hematuria: Secondary | ICD-10-CM | POA: Diagnosis not present

## 2020-02-23 MED ORDER — LIDOCAINE HCL 2 % IJ SOLN
10.0000 mL | Freq: Once | INTRAMUSCULAR | Status: AC
  Administered 2020-02-23: 200 mg

## 2020-02-23 MED ORDER — HYDROCORTISONE SOD SUCCINATE 100 MG PF FOR IT USE
100.0000 mg | Freq: Once | INTRAMUSCULAR | Status: AC
  Administered 2020-02-23: 100 mg via INTRATHECAL

## 2020-02-23 MED ORDER — SODIUM BICARBONATE 8.4 % IV SOLN
50.0000 meq | Freq: Once | INTRAVENOUS | Status: AC
  Administered 2020-02-23: 50 meq

## 2020-02-23 MED ORDER — DIMETHYL SULFOXIDE 50 % IS SOLN
50.0000 mL | Freq: Once | INTRAVESICAL | Status: AC
  Administered 2020-02-23: 50 mL via URETHRAL

## 2020-02-23 NOTE — Progress Notes (Signed)
Bladder Instillation  Due to IC patient is present today for a Bladder Instillation of RIMSO. Patient was cleaned and prepped in a sterile fashion with betadine.  A 18FR catheter was inserted, urine return was noted 79ml, urine was yellow in color.   ml was instilled into the bladder. The catheter was then removed. Patient tolerated well, no complications were noted Patient held in bladder for 30 minutes prior to procedure starting.   Performed by: Murvin Natal

## 2020-03-02 ENCOUNTER — Telehealth: Payer: Self-pay | Admitting: Family Medicine

## 2020-03-02 NOTE — Telephone Encounter (Signed)
Pharmacy requesting refill on Meclizine 25 mg tablet. Take one tablet po TID prn dizziness or nausea. Pt last seen 02/04/20 for wellness. Please advise. Thank you

## 2020-03-03 MED ORDER — MECLIZINE HCL 25 MG PO TABS: 25.0000 mg | ORAL_TABLET | Freq: Three times a day (TID) | ORAL | 1 refills | Status: DC | PRN

## 2020-03-03 NOTE — Addendum Note (Signed)
Addended by: Erven Colla on: 03/03/2020 06:36 AM   Modules accepted: Orders

## 2020-03-08 ENCOUNTER — Telehealth: Payer: Self-pay | Admitting: *Deleted

## 2020-03-08 ENCOUNTER — Ambulatory Visit (INDEPENDENT_AMBULATORY_CARE_PROVIDER_SITE_OTHER): Payer: Medicare Other

## 2020-03-08 DIAGNOSIS — R0600 Dyspnea, unspecified: Secondary | ICD-10-CM

## 2020-03-08 DIAGNOSIS — R0609 Other forms of dyspnea: Secondary | ICD-10-CM

## 2020-03-08 LAB — ECHOCARDIOGRAM COMPLETE
AR max vel: 1.79 cm2
AV Area VTI: 2.15 cm2
AV Area mean vel: 1.73 cm2
AV Mean grad: 2.7 mmHg
AV Peak grad: 5.4 mmHg
Ao pk vel: 1.16 m/s
Area-P 1/2: 3.27 cm2
Calc EF: 63.1 %
MV M vel: 1.91 m/s
MV Peak grad: 14.6 mmHg
S' Lateral: 2.21 cm
Single Plane A2C EF: 68.4 %
Single Plane A4C EF: 55.8 %

## 2020-03-08 NOTE — Telephone Encounter (Signed)
Laurine Blazer, LPN  11/03/9448 3:88 PM EST Back to Top     Notified, copy to pcp.

## 2020-03-08 NOTE — Telephone Encounter (Signed)
Laurine Blazer, LPN  1/54/0086 7:61 PM EST Back to Top     Left message to return call.

## 2020-03-08 NOTE — Telephone Encounter (Signed)
-----   Message from Merlene Laughter, RN sent at 03/08/2020  3:40 PM EST -----  ----- Message ----- From: Erma Heritage, PA-C Sent: 03/08/2020  12:45 PM EST To: Merlene Laughter, RN  Ordered by Katina Dung, NP - Please let the patient know her echocardiogram showed normal pumping function of the heart with a preserved EF of 60 to 65%. There were no wall motion abnormalities. She did have mildly abnormal relaxation of the heart which is common to see with hypertension and good blood pressure control is essential. Also had mild leakage along the mitral valve but no significant valve abnormalities. Overall, a reassuring study.

## 2020-03-09 ENCOUNTER — Other Ambulatory Visit: Payer: Medicare Other

## 2020-03-09 ENCOUNTER — Other Ambulatory Visit: Payer: Self-pay

## 2020-03-09 DIAGNOSIS — N3010 Interstitial cystitis (chronic) without hematuria: Secondary | ICD-10-CM | POA: Diagnosis not present

## 2020-03-09 MED ORDER — NITROFURANTOIN MONOHYD MACRO 100 MG PO CAPS: 100.0000 mg | ORAL_CAPSULE | Freq: Two times a day (BID) | ORAL | 0 refills | Status: DC

## 2020-03-09 NOTE — Progress Notes (Signed)
Pt called complaining of UTI symptoms. Appt made for urine drop off. Macrobid 100 mg po bid started per Dr. Alyson Ingles.

## 2020-03-11 LAB — URINE CULTURE

## 2020-03-12 NOTE — Progress Notes (Signed)
Cardiology Office Note  Date: 03/13/2020   ID: [name], DOB [birth year], MRN [id number]  PCP:  Deborah Colla, DO  Cardiologist:  No primary care provider on file. Electrophysiologist:  None   Chief Complaint: Cardiac follow up.  History of Present Illness: Deborah Gray is a 87 y.o. female with a history of SVT, HTN, DM2, HLD, esophageal stricture and stenosis, dysphagia, diverticulosis. Hx of palpitations  Lat encounter with Deborah Pho PA,  After recent [facility] ED visit 06/04/2019. Visit  follow up secondary to complaints of worsening dyspnea and weakness with intermittent episodes of chest discomfort. CP worse after food intake for  The prior 2 weeks. Troponins were negative x 2. Her pain was thought likely due to GI issues. At follow up she complained of chest discomfort with activity and improvement with rest. Lexiscan stress test was ordered. She denied any recent palpitations. No currently on BB therapy. BP well controlled on Losartan. Currently on Zetia and ASA for hyperlipidemia.   She was last here for 13-month follow-up today.  She denied any recent hospitalizations or acute illnesses.  States she had been having some itching over the last few years.  She states she was told losartan could cause itching.  She was asking if possibly she could be switched to another medication for her blood pressure management.  Otherwise she complained of some chronic dyspnea which seem to be worsening to some degree.  She states she has a history of rheumatic heart disease..  She denied any orthostatic symptoms, CVA or TIA-like symptoms, palpitations or arrhythmias, orthostatic symptoms, PND, orthopnea, claudication-like symptoms, DVT or PE-like symptoms.  She d has some chronic lower extremity edema and venous varicosities.  Here for follow-up today.  States her itching has improved since stopping losartan and starting the lisinopril.  She has not obtained the lab work scheduled for 2  weeks after she started her lisinopril.  We went over the echocardiogram which showed no significant changes from the previous one in 2015.  She denies any anginal or exertional symptoms.  States the itching has decreased by at least 75%.  States she bought some compression stockings but they are still tight at the top near her knee and may need to get a larger size to help with the discomfort.  She denies any other palpitations or arrhythmias, orthostatic symptoms, CVA or TIA-like symptoms, PND, orthopnea.  Denies any bleeding issues, claudication-like symptoms, DVT or PE-like symptoms.  Does have some chronic lower extremity edema with some venous varicosities.  Past Medical History:  Diagnosis Date  . Arthritis   . Benign neoplasm of colon   . Constipation   . Diaphragmatic hernia without mention of obstruction or gangrene   . Diverticulosis of colon (without mention of hemorrhage)   . Dysphagia, pharyngoesophageal phase   . Esophageal reflux   . Essential hypertension   . Heart murmur   . Hemorrhoids   . Hyperlipidemia   . Internal hemorrhoids without mention of complication   . Left wrist fracture   . PONV (postoperative nausea and vomiting)   . Stricture and stenosis of esophagus   . Type 2 diabetes mellitus ([place])   . Unspecified gastritis and gastroduodenitis without mention of hemorrhage     Past Surgical History:  Procedure Laterality Date  . ABDOMINAL HYSTERECTOMY    . BACTERIAL OVERGROWTH TEST N/A 09/06/2015   Procedure: BACTERIAL OVERGROWTH TEST;  Surgeon: Deborah Dolin, MD;  Location: [facility];  Service: Endoscopy;  Laterality: N/A;  800  . BREAST LUMPECTOMY Right    benign  . CATARACT EXTRACTION Bilateral 2006   Implants in both, surgeries done 6 weeks apart  . CHOLECYSTECTOMY    . COLONOSCOPY  03/08/02   Deborah Gray: diverticulosis, internal hemorrhoids, adenomatous colon polyp  . COLONOSCOPY  01/23/04   Deborah Gray: diverticulosis, internal hemorrhoids  .  COLONOSCOPY  09/25/05   Deborah Gray: diverticulosis  . COLONOSCOPY  07/05/09   Deborah Gray: severe diverticulosis  . COLONOSCOPY  01/21/11   Deborah Gray: severe diverticulosis in sigmoid to desc colon, int hemorrhoids, follow up TCS in 5 years  . COLONOSCOPY N/A 04/10/2016   Deborah Gray: diverticulosis  . DILATION AND CURETTAGE OF UTERUS    . ESOPHAGEAL MANOMETRY  03/21/08   Deborah Gray: findings c/w [place] Esophagus  . ESOPHAGOGASTRODUODENOSCOPY  03/08/02   Deborah Gray: esophageal stricture, chronic gerd, s/p dilation.   . ESOPHAGOGASTRODUODENOSCOPY  01/23/04   Deborah Gray: esophageal stricture, gastritis, hiatal hernia  . ESOPHAGOGASTRODUODENOSCOPY  09/25/05   Deborah Gray: gastritis, benign bx, no H.pylori  . ESOPHAGOGASTRODUODENOSCOPY  07/05/09   Deborah Gray: gastropathy, benign small bowel bx and gastric bx  . ESOPHAGOGASTRODUODENOSCOPY N/A 05/15/2015   Dr. Jena Gray- diffuse moderate inflammation characterized by congestion (edema), erythema, and linear erosions was found in the entire examined stomach. bx= reactive gastropathy  . FOOT SURGERY Left   . HEMORRHOID BANDING  2017   Dr.[name]  . LAPAROSCOPIC VAGINAL HYSTERECTOMY    . ORIF WRIST FRACTURE Left 06/23/2018   Procedure: OPEN REDUCTION INTERNAL FIXATION (ORIF) LEFT WRIST FRACTURE;  Surgeon: Deborah Apley, MD;  Location: [facility];  Service: Orthopedics;  Laterality: Left;  regional arm block  . RECTOCELE REPAIR    . TOTAL KNEE ARTHROPLASTY Right     Current Outpatient Medications  Medication Sig Dispense Refill  . acetaminophen (TYLENOL) 500 MG tablet Take 500 mg by mouth at bedtime.    Marland Kitchen aspirin EC 81 MG tablet Take 81 mg by mouth at bedtime.    . blood glucose meter kit and supplies KIT Dispense based on  insurance preference. Tests once a day E11.9 1 each 0  . Carboxymethylcellulose Sodium (THERATEARS) 0.25 % SOLN Apply 1 drop to eye 2 (two) times daily.     . cefUROXime (CEFTIN) 250 MG tablet Take 1 tablet (250 mg total) by mouth 2  (two) times daily with a meal. 14 tablet 0  . cetirizine (ZYRTEC) 10 MG tablet Take 10 mg by mouth daily.    . clidinium-chlordiazePOXIDE (LIBRAX) 5-2.5 MG capsule Take 1 capsule by mouth 4 (four) times daily as needed. 120 capsule 3  . CREON 24000-76000 units CPEP TAKE 3 CAPSULES THREE TIMES DAILY WITH MEALS AND ONE CAPSULE WITH SNACK TWICE DAILY 990 capsule 2  . ezetimibe (ZETIA) 10 MG tablet TAKE 1 TABLET BY MOUTH EVERY DAY 90 tablet 3  . fluticasone (FLONASE) 50 MCG/ACT nasal spray SPRAY 2 SPRAYS INTO EACH NOSTRIL EVERY DAY 48 mL 1  . guaiFENesin (MUCINEX) 600 MG 12 hr tablet Take 600 mg by mouth daily.    . hyoscyamine (LEVSIN SL) 0.125 MG SL tablet USE 1 TABLET UNDER THE TONGUE BEFORE MEALS AND AT BEDTIME AS NEEDED FOR FLARES IN SYMPTOMS 90 tablet 1  . lisinopril (ZESTRIL) 20 MG tablet Take 1 tablet (20 mg total) by mouth daily. 90 tablet 3  . meclizine (ANTIVERT) 25 MG tablet Take 1 tablet (25 mg total) by mouth 3 (three) times daily as needed. TAKE 1 TABLET (25 MG TOTAL) BY MOUTH 3 (THREE) TIMES  DAILY AS NEEDED FOR DIZZINESS OR NAUSEA. 30 tablet 1  . metFORMIN (GLUCOPHAGE) 500 MG tablet TAKE ONE TABLET BY MOUTH EVERY MORNING ONE AT LUNCH AND 2 AT BEDTIME (Patient taking differently: Take 500 mg by mouth in the morning and at bedtime. TAKE ONE TABLET BY MOUTH EVERY MORNING and  ONE or 2 AT BEDTIME) 360 tablet 1  . mirabegron ER (MYRBETRIQ) 25 MG TB24 tablet Take 1 tablet (25 mg total) by mouth in the morning and at bedtime. 60 tablet 11  . Multiple Vitamins-Minerals (PRESERVISION AREDS 2) CAPS Take 1 capsule by mouth 2 (two) times daily.     Marland Kitchen MYRBETRIQ 50 MG TB24 tablet TAKE 1 TABLET BY MOUTH EVERY DAY 90 tablet 3  . nateglinide (STARLIX) 120 MG tablet TAKE 1 TABLET BY MOUTH 3 TIMES A DAY BEFORE MEALS 270 tablet 1  . nitrofurantoin, macrocrystal-monohydrate, (MACROBID) 100 MG capsule Take 1 capsule (100 mg total) by mouth 2 (two) times daily. 14 capsule 0  . NON FORMULARY Phillips probiotic   Once a day    . omega-3 acid ethyl esters (LOVAZA) 1 g capsule Take 3 g by mouth daily.     Marland Kitchen OVER THE COUNTER MEDICATION Vitamin D     One a day  Not sure strength    . Vitamin Mixture (ESTER-C) 500-60 MG TABS Take 1 tablet by mouth daily.      No current facility-administered medications for this visit.   Allergies:  Adhesive [tape], Estrogens, and Sulfonamide derivatives   Social History: The patient  reports that she has never smoked. She has never used smokeless tobacco. She reports that she does not drink alcohol and does not use drugs.   Family History: The patient's family history includes Breast cancer in her sister; Colon cancer in her cousin; Diabetes in her father and maternal aunt; Heart disease in her father; Liver cancer in her sister; Liver disease in her cousin; Ovarian cancer in her mother.   ROS:  Please see the history of present illness. Otherwise, complete review of systems is positive for none.  All other systems are reviewed and negative.   Physical Exam: VS:  BP 138/62   Pulse 77   Resp 16   Ht 5\' 4"  (1.626 m)   Wt 141 lb 6.4 oz (64.1 kg)   SpO2 99%   BMI 24.27 kg/m , BMI Body mass index is 24.27 kg/m.  Wt Readings from Last 3 Encounters:  03/13/20 141 lb 6.4 oz (64.1 kg)  02/14/20 143 lb 3.2 oz (65 kg)  02/04/20 142 lb 12.8 oz (64.8 kg)    General: Patient appears comfortable at rest. Neck: Supple, no elevated JVP or carotid bruits, no thyromegaly. Lungs: Clear to auscultation, nonlabored breathing at rest. Cardiac: Regular rate and rhythm, no S3 or significant systolic murmur, no pericardial rub. Extremities: Mild non-pitting edema with venous varicosities, distal pulses 2+. Skin: Warm and dry. Musculoskeletal: No kyphosis. Neuropsychiatric: Alert and oriented x3, affect grossly appropriate.  ECG:  EKG May 25, 2019: Sinus rhythm rate of 91.  Probable anteroseptal infarct old  Recent Labwork: 05/25/2019: Hemoglobin 13.9; Platelets 261 01/27/2020:  ALT 15; AST 13; BUN 23; Creatinine, Ser 0.74; Sodium 140 02/07/2020: Potassium 4.5     Component Value Date/Time   CHOL 189 01/27/2020 1057   TRIG 90 01/27/2020 1057   HDL 70 01/27/2020 1057   CHOLHDL 2.7 01/27/2020 1057   CHOLHDL 3.6 11/22/2013 0852   VLDL 29 11/22/2013 0852   LDLCALC 103 (H) 01/27/2020  1057    Other Studies Reviewed Today:  Echocardiogram 03/08/2020  1. Left ventricular ejection fraction, by estimation, is 60 to 65%. The left ventricle has normal function. The left ventricle has no regional wall motion abnormalities. There is mild left ventricular hypertrophy. Left ventricular diastolic parameters are consistent with Grade I diastolic dysfunction (impaired relaxation). Elevated left atrial pressure. The average left ventricular global longitudinal strain is -17.6 %. The global longitudinal strain is normal. 2. Right ventricular systolic function is normal. The right ventricular size is normal. 3. Left atrial size was moderately dilated. 4. The mitral valve is normal in structure. Trivial mitral valve regurgitation. No evidence of mitral stenosis. 5. The aortic valve is tricuspid. There is mild calcification of the aortic valve. There is mild thickening of the aortic valve. Aortic valve regurgitation is not visualized. No aortic stenosis is present. 6. The inferior vena cava is normal in size with greater than 50% respiratory variability, suggesting right atrial pressure of 3 mmHg. Comparison(s): Echocardiogram done 12/06/13 showed an EF of 65%.   NST 07/08/2019 Narrative & Impression   There was no ST segment deviation noted during stress.  The study is normal. There are no perfusion defects consistent with prior infarct or current ischemia.  This is a low risk study.  The left ventricular ejection fraction is normal (55-65%).     Echocardiogram: 11/2013 Study Conclusions   - Left ventricle: The cavity size was normal. Wall thickness was   increased in a pattern of mild LVH. Systolic function was  vigorous. The estimated ejection fraction was in the range of 65%  to 70%. Wall motion was normal; there were no regional wall  motion abnormalities. Doppler parameters are consistent with  abnormal left ventricular relaxation (grade 1 diastolic  dysfunction). Doppler parameters are consistent with elevated  ventricular end-diastolic filling pressure.  - Aortic valve: Trileaflet; mildly calcified leaflets. Moderate  thickening and calcification involving the left coronary cusp.  There was no significant regurgitation.  - Mitral valve: There was trivial regurgitation.  - Right atrium: Central venous pressure (est): 3 mm Hg.  - Atrial septum: No defect or patent foramen ovale was identified.  - Tricuspid valve: There was trivial regurgitation.  - Pulmonary arteries: PA peak pressure: 32 mm Hg (S).  - Pericardium, extracardiac: There was no pericardial effusion.   Impressions:   - Mild LVH with LVEF 63-01%, grade 1 diastolic dysfunction with  increased filling pressures. Mildly sclerotic aortic valve.  Trivial mitral and tricuspid regurgitation. Upper normal PASP 32  mm mercury.   NST: 07/2015  There was no ST segment deviation noted during stress.  No T wave inversion was noted during stress.  The study is normal.  This is a low risk study.  The left ventricular ejection fraction is hyperdynamic (>65%).   Assessment and Plan:   1. Essential hypertension Blood pressure today 138/62.  Last visit she stated she had been on losartan she and had been experiencing chronic itching.  She stated she was told losartan causes itching.  We switched the losartan to lisinopril.  Patient states her itching has stopped approximately 75%.  Continue lisinopril 20 mg daily please get schedule lab work today  2. Mixed hyperlipidemia Continue Zetia 10 mg daily.  3. Dyspnea on exertion Complaining of  increased dyspnea on exertion recently.  States she has a history of rheumatic heart disease.Echocardiogram 03/08/2020 showed EF of 60 to 65%.  Mild LVH, G1 DD, elevated left atrial pressure.  Trivial MR.  No  significant change since previous echo in 2015.    Medication Adjustments/Labs and Tests Ordered: Current medicines are reviewed at length with the patient today.  Concerns regarding medicines are outlined above.   Disposition: Follow-up with Dr. Domenic Polite or [facility] 6 months.  Signed, Levell July, NP 03/13/2020 10:57 AM    [facility] [place], [place], [place] [address] Phone: [phone]; Fax: [phone]

## 2020-03-13 ENCOUNTER — Telehealth: Payer: Self-pay | Admitting: Urology

## 2020-03-13 ENCOUNTER — Ambulatory Visit (INDEPENDENT_AMBULATORY_CARE_PROVIDER_SITE_OTHER): Payer: Medicare Other | Admitting: Family Medicine

## 2020-03-13 ENCOUNTER — Other Ambulatory Visit: Payer: Self-pay

## 2020-03-13 ENCOUNTER — Encounter: Payer: Self-pay | Admitting: Family Medicine

## 2020-03-13 VITALS — BP 138/62 | HR 77 | Resp 16 | Ht 64.0 in | Wt 141.4 lb

## 2020-03-13 DIAGNOSIS — I10 Essential (primary) hypertension: Secondary | ICD-10-CM | POA: Diagnosis not present

## 2020-03-13 DIAGNOSIS — R0600 Dyspnea, unspecified: Secondary | ICD-10-CM | POA: Diagnosis not present

## 2020-03-13 DIAGNOSIS — R0609 Other forms of dyspnea: Secondary | ICD-10-CM

## 2020-03-13 DIAGNOSIS — E782 Mixed hyperlipidemia: Secondary | ICD-10-CM | POA: Diagnosis not present

## 2020-03-13 NOTE — Patient Instructions (Addendum)
Medication Instructions:  Continue all current medications.  Labwork:  BMET, Mg - orders given today.   Office will contact with results via phone or letter.    Testing/Procedures: none  Follow-Up: 6 months   Any Other Special Instructions Will Be Listed Below (If Applicable).  If you need a refill on your cardiac medications before your next appointment, please call your pharmacy.  

## 2020-03-13 NOTE — Telephone Encounter (Signed)
Called patient to clarify rx dosage. lmtrc

## 2020-03-13 NOTE — Telephone Encounter (Signed)
Patient takes 25mg  myrbetriq po BID

## 2020-03-13 NOTE — Telephone Encounter (Signed)
Patient called requesting a 90 day refill on her myrbetriq prescription.

## 2020-03-14 ENCOUNTER — Telehealth: Payer: Self-pay

## 2020-03-14 NOTE — Telephone Encounter (Signed)
-----   Message from Cleon Gustin, MD sent at 03/14/2020 11:04 AM EST ----- negative ----- Message ----- From: Dorisann Frames, RN Sent: 03/13/2020  10:30 AM EST To: Cleon Gustin, MD  Please review- pt is on macrobid

## 2020-03-14 NOTE — Telephone Encounter (Signed)
Patient notified urine culture results.

## 2020-03-15 DIAGNOSIS — I10 Essential (primary) hypertension: Secondary | ICD-10-CM | POA: Diagnosis not present

## 2020-03-16 ENCOUNTER — Other Ambulatory Visit: Payer: Self-pay

## 2020-03-16 ENCOUNTER — Telehealth: Payer: Self-pay | Admitting: *Deleted

## 2020-03-16 DIAGNOSIS — Z87440 Personal history of urinary (tract) infections: Secondary | ICD-10-CM

## 2020-03-16 LAB — BASIC METABOLIC PANEL
BUN/Creatinine Ratio: 28 (ref 12–28)
BUN: 24 mg/dL (ref 8–27)
CO2: 24 mmol/L (ref 20–29)
Calcium: 10.5 mg/dL — ABNORMAL HIGH (ref 8.7–10.3)
Chloride: 103 mmol/L (ref 96–106)
Creatinine, Ser: 0.85 mg/dL (ref 0.57–1.00)
GFR calc Af Amer: 73 mL/min/{1.73_m2} (ref >59)
GFR calc non Af Amer: 63 mL/min/{1.73_m2} (ref >59)
Glucose: 150 mg/dL — ABNORMAL HIGH (ref 65–99)
Potassium: 4.9 mmol/L (ref 3.5–5.2)
Sodium: 143 mmol/L (ref 134–144)

## 2020-03-16 LAB — MAGNESIUM: Magnesium: 2 mg/dL (ref 1.6–2.3)

## 2020-03-16 MED ORDER — MIRABEGRON ER 25 MG PO TB24: 25.0000 mg | ORAL_TABLET | Freq: Two times a day (BID) | ORAL | 11 refills | Status: DC

## 2020-03-16 NOTE — Telephone Encounter (Signed)
-----   Message from Verta Ellen., NP sent at 03/16/2020  8:02 AM EST ----- Please call the patient and let her know her lab work looks good except for her sugar is elevated at 150.  Calcium is only slightly above normal.  Tell her to talk to her primary to see if they can better manage the blood sugar.  Thank you

## 2020-03-16 NOTE — Telephone Encounter (Signed)
Refill submitted. 

## 2020-03-16 NOTE — Telephone Encounter (Signed)
Laurine Blazer, LPN  3/64/6803 2:12 PM EST Back to Top     Notified, copy to pcp.

## 2020-03-17 ENCOUNTER — Other Ambulatory Visit: Payer: Self-pay | Admitting: Urology

## 2020-03-17 DIAGNOSIS — E1142 Type 2 diabetes mellitus with diabetic polyneuropathy: Secondary | ICD-10-CM | POA: Diagnosis not present

## 2020-03-17 DIAGNOSIS — B351 Tinea unguium: Secondary | ICD-10-CM | POA: Diagnosis not present

## 2020-03-17 DIAGNOSIS — Z87440 Personal history of urinary (tract) infections: Secondary | ICD-10-CM

## 2020-03-19 ENCOUNTER — Other Ambulatory Visit: Payer: Self-pay | Admitting: Gastroenterology

## 2020-03-19 DIAGNOSIS — K861 Other chronic pancreatitis: Secondary | ICD-10-CM

## 2020-03-20 ENCOUNTER — Other Ambulatory Visit: Payer: Self-pay

## 2020-03-20 DIAGNOSIS — Z87440 Personal history of urinary (tract) infections: Secondary | ICD-10-CM

## 2020-03-20 MED ORDER — MIRABEGRON ER 50 MG PO TB24: 50.0000 mg | ORAL_TABLET | Freq: Every day | ORAL | 3 refills | Status: DC

## 2020-03-20 NOTE — Progress Notes (Signed)
Patient called stating insurance will not cover the 25 mg bid but will cover 50 mg qd. rx changed to reflect change.

## 2020-03-21 DIAGNOSIS — H34832 Tributary (branch) retinal vein occlusion, left eye, with macular edema: Secondary | ICD-10-CM | POA: Diagnosis not present

## 2020-03-22 ENCOUNTER — Other Ambulatory Visit: Payer: Self-pay

## 2020-03-22 ENCOUNTER — Ambulatory Visit (INDEPENDENT_AMBULATORY_CARE_PROVIDER_SITE_OTHER): Payer: Medicare Other | Admitting: Urology

## 2020-03-22 ENCOUNTER — Encounter: Payer: Self-pay | Admitting: Urology

## 2020-03-22 VITALS — BP 151/77 | HR 96 | Temp 98.3°F | Ht 64.0 in | Wt 141.4 lb

## 2020-03-22 DIAGNOSIS — N3010 Interstitial cystitis (chronic) without hematuria: Secondary | ICD-10-CM | POA: Diagnosis not present

## 2020-03-22 LAB — URINALYSIS, ROUTINE W REFLEX MICROSCOPIC
Bilirubin, UA: NEGATIVE
Glucose, UA: NEGATIVE
Ketones, UA: NEGATIVE
Nitrite, UA: NEGATIVE
Specific Gravity, UA: 1.025 (ref 1.005–1.030)
Urobilinogen, Ur: 0.2 mg/dL (ref 0.2–1.0)
pH, UA: 5 (ref 5.0–7.5)

## 2020-03-22 LAB — MICROSCOPIC EXAMINATION
Epithelial Cells (non renal): >10 /hpf — AB (ref 0–10)
Renal Epithel, UA: NONE SEEN /hpf

## 2020-03-22 MED ORDER — HYDROCORTISONE SOD SUCCINATE 100 MG PF FOR IT USE
100.0000 mg | Freq: Once | INTRAMUSCULAR | Status: AC
  Administered 2020-03-22: 100 mg via INTRATHECAL

## 2020-03-22 MED ORDER — LIDOCAINE HCL 2 % IJ SOLN
10.0000 mL | Freq: Once | INTRAMUSCULAR | Status: AC
  Administered 2020-03-22: 200 mg

## 2020-03-22 MED ORDER — SODIUM BICARBONATE 8.4 % IV SOLN
50.0000 meq | Freq: Once | INTRAVENOUS | Status: AC
  Administered 2020-03-22: 50 meq

## 2020-03-22 MED ORDER — CIPROFLOXACIN HCL 500 MG PO TABS
500.0000 mg | ORAL_TABLET | Freq: Once | ORAL | Status: AC
  Administered 2020-03-22: 500 mg via ORAL

## 2020-03-22 MED ORDER — DIMETHYL SULFOXIDE 50 % IS SOLN
50.0000 mL | Freq: Once | INTRAVESICAL | Status: AC
  Administered 2020-03-22: 50 mL via URETHRAL

## 2020-03-22 NOTE — Progress Notes (Signed)
   03/22/20  CC: Gross hematuria  HPI: Deborah Gray is a 87yo here for evaluation of gross hematuria. No recent hematuria episodes Blood pressure (!) 151/77, pulse 96, temperature 98.3 F (36.8 C), height 5\' 4"  (1.626 m), weight 141 lb 6.4 oz (64.1 kg). NED. A&Ox3.   No respiratory distress   Abd soft, NT, ND Normal external genitalia with patent urethral meatus  Cystoscopy Procedure Note  Patient identification was confirmed, informed consent was obtained, and patient was prepped using Betadine solution.  Lidocaine jelly was administered per urethral meatus.    Procedure: - Flexible cystoscope introduced, without any difficulty.   - Thorough search of the bladder revealed:    normal urethral meatus    normal urothelium    no stones    1cm right posterior lateral wall ulcer.     no tumors    no urethral polyps    no trabeculation  - Ureteral orifices were normal in position and appearance.  Post-Procedure: - Patient tolerated the procedure well  Assessment/ Plan: We discussed the management of the ulcer and her known history of IC and Hunners ulcers. After discussing the treatment options the patient wishes to proceed with surveillance   Return in about 3 months (around 06/19/2020).  [name], MD

## 2020-03-22 NOTE — Patient Instructions (Signed)

## 2020-03-22 NOTE — Progress Notes (Signed)
Bladder Instillation  Due to IC patient is present today for a Bladder Instillation of DMSO. Patient was cleaned and prepped in a sterile fashion with betadine.  A 18FR catheter was inserted, urine return was noted 85ml, urine was yellow in color.  DMSO medication  was instilled into the bladder. The catheter was then removed. Patient tolerated well, no complications were noted  Performed by: Estill Bamberg RN  Follow up/ Additional notes: NV 1 in month MD in 3 months.

## 2020-03-22 NOTE — Progress Notes (Signed)
Urological Symptom Review  Patient is experiencing the following symptoms: Frequent urination Hard to postpone urination Burning/pain with urination Get up at night to urinate Blood in urine   Review of Systems  Gastrointestinal (upper)  : Indigestion/heartburn  Gastrointestinal (lower) : Negative for lower GI symptoms  Constitutional : Night Sweats Weight loss Fatigue  Skin: Itching  Eyes: Negative for eye symptoms  Ear/Nose/Throat : Sinus problems  Hematologic/Lymphatic: Easy bruising  Cardiovascular : Leg swelling  Respiratory : Shortness of breath  Endocrine: Negative for endocrine symptoms  Musculoskeletal: Back pain Joint pain  Neurological: Negative for neurological symptoms  Psychologic: Negative for psychiatric symptoms

## 2020-04-19 ENCOUNTER — Ambulatory Visit (INDEPENDENT_AMBULATORY_CARE_PROVIDER_SITE_OTHER): Payer: Medicare Other

## 2020-04-19 ENCOUNTER — Other Ambulatory Visit: Payer: Self-pay

## 2020-04-19 DIAGNOSIS — N3010 Interstitial cystitis (chronic) without hematuria: Secondary | ICD-10-CM | POA: Diagnosis not present

## 2020-04-19 LAB — URINALYSIS, ROUTINE W REFLEX MICROSCOPIC
Bilirubin, UA: NEGATIVE
Glucose, UA: NEGATIVE
Ketones, UA: NEGATIVE
Leukocytes,UA: NEGATIVE
Nitrite, UA: NEGATIVE
Protein,UA: NEGATIVE
RBC, UA: NEGATIVE
Specific Gravity, UA: 1.02 (ref 1.005–1.030)
Urobilinogen, Ur: 0.2 mg/dL (ref 0.2–1.0)
pH, UA: 5.5 (ref 5.0–7.5)

## 2020-04-19 MED ORDER — HYDROCORTISONE SOD SUCCINATE 100 MG PF FOR IT USE
100.0000 mg | Freq: Once | INTRAMUSCULAR | Status: AC
  Administered 2020-04-19: 100 mg via INTRATHECAL

## 2020-04-19 MED ORDER — SODIUM BICARBONATE 8.4 % IV SOLN
50.0000 meq | Freq: Once | INTRAVENOUS | Status: AC
  Administered 2020-04-19: 50 meq

## 2020-04-19 MED ORDER — LIDOCAINE HCL 2 % IJ SOLN: 10.0000 mL | Freq: Once | INTRAMUSCULAR | Status: DC

## 2020-04-19 MED ORDER — LIDOCAINE HCL 1 % IJ SOLN
20.0000 mL | Freq: Once | INTRAMUSCULAR | Status: AC
  Administered 2020-04-19: 20 mL

## 2020-04-19 MED ORDER — DIMETHYL SULFOXIDE 50 % IS SOLN
50.0000 mL | Freq: Once | INTRAVESICAL | Status: AC
  Administered 2020-04-19: 50 mL via URETHRAL

## 2020-04-19 NOTE — Progress Notes (Signed)
Bladder Instillation DMSO  Due to cystitis patient is present today for a Bladder Instillation of DMSO treatment. Patient was cleaned and prepped in a sterile fashion with betadine.  A 18FR catheter was inserted, urine return was noted 43ml, urine was yellow in color.  DMSO treatment was instilled into the bladder. Patient tolerated well, no complications were noted.The cath was plugged. Pt was instructed to hold the instillation for 20 minutes. Pt voiced understanding.   After 20 minutes cath was unplugged. Cath was removed.     Preformed by: Valentina Lucks, LPN  Follow up/ Additional notes: 1 month

## 2020-04-19 NOTE — Addendum Note (Signed)
Addended by: Valentina Lucks on: 04/19/2020 05:12 PM   Modules accepted: Orders

## 2020-04-26 ENCOUNTER — Other Ambulatory Visit: Payer: Self-pay

## 2020-04-26 ENCOUNTER — Other Ambulatory Visit: Payer: Medicare Other

## 2020-04-26 DIAGNOSIS — R319 Hematuria, unspecified: Secondary | ICD-10-CM | POA: Diagnosis not present

## 2020-04-26 LAB — URINALYSIS, ROUTINE W REFLEX MICROSCOPIC
Bilirubin, UA: NEGATIVE
Glucose, UA: NEGATIVE
Ketones, UA: NEGATIVE
Leukocytes,UA: NEGATIVE
Nitrite, UA: NEGATIVE
Specific Gravity, UA: 1.025 (ref 1.005–1.030)
Urobilinogen, Ur: 0.2 mg/dL (ref 0.2–1.0)
pH, UA: 6 (ref 5.0–7.5)

## 2020-04-26 LAB — MICROSCOPIC EXAMINATION
Bacteria, UA: NONE SEEN
Epithelial Cells (non renal): NONE SEEN /hpf (ref 0–10)
RBC, Urine: >30 /hpf — AB (ref 0–2)
Renal Epithel, UA: NONE SEEN /hpf
WBC, UA: NONE SEEN /hpf (ref 0–5)

## 2020-04-30 LAB — CULTURE, URINE COMPREHENSIVE

## 2020-05-02 ENCOUNTER — Telehealth: Payer: Self-pay

## 2020-05-02 NOTE — Telephone Encounter (Signed)
Left message on phone pts urine was negative.

## 2020-05-02 NOTE — Telephone Encounter (Signed)
-----   Message from Cleon Gustin, MD sent at 05/02/2020 10:36 AM EDT ----- negative ----- Message ----- From: Valentina Lucks, LPN Sent: 7/41/4239  10:58 AM EDT To: Cleon Gustin, MD  Pls review.

## 2020-05-05 ENCOUNTER — Other Ambulatory Visit: Payer: Self-pay | Admitting: Internal Medicine

## 2020-05-09 DIAGNOSIS — H34832 Tributary (branch) retinal vein occlusion, left eye, with macular edema: Secondary | ICD-10-CM | POA: Diagnosis not present

## 2020-05-22 ENCOUNTER — Ambulatory Visit (INDEPENDENT_AMBULATORY_CARE_PROVIDER_SITE_OTHER): Payer: Medicare Other

## 2020-05-22 ENCOUNTER — Other Ambulatory Visit: Payer: Self-pay

## 2020-05-22 DIAGNOSIS — R319 Hematuria, unspecified: Secondary | ICD-10-CM

## 2020-05-22 LAB — URINALYSIS, ROUTINE W REFLEX MICROSCOPIC
Bilirubin, UA: NEGATIVE
Glucose, UA: NEGATIVE
Ketones, UA: NEGATIVE
Leukocytes,UA: NEGATIVE
Nitrite, UA: NEGATIVE
Protein,UA: NEGATIVE
Specific Gravity, UA: 1.015 (ref 1.005–1.030)
Urobilinogen, Ur: 0.2 mg/dL (ref 0.2–1.0)
pH, UA: 7 (ref 5.0–7.5)

## 2020-05-22 LAB — MICROSCOPIC EXAMINATION
Bacteria, UA: NONE SEEN
Renal Epithel, UA: NONE SEEN /hpf

## 2020-05-22 MED ORDER — SODIUM BICARBONATE 8.4 % IV SOLN
50.0000 meq | Freq: Once | INTRAVENOUS | Status: AC
  Administered 2020-05-22: 50 meq via INTRAVENOUS

## 2020-05-22 MED ORDER — DIMETHYL SULFOXIDE 50 % IS SOLN
50.0000 mL | Freq: Once | INTRAVESICAL | Status: AC
  Administered 2020-05-22: 50 mL via URETHRAL

## 2020-05-22 MED ORDER — HYDROCORTISONE NA SUCCINATE PF 100 MG IJ SOLR
100.0000 mg | Freq: Once | INTRAMUSCULAR | Status: AC
  Administered 2020-05-22: 100 mg

## 2020-05-22 NOTE — Progress Notes (Signed)
Bladder Instillation  Due to Interstitial cystitis patient is present today for a Bladder Instillation of DMSO. Patient was cleaned and prepped in a sterile fashion with betadine and lidocaine 2% jelly was instilled into the urethra.  A 18FR catheter was inserted, urine return was noted 64ml, urine was yellow in color.  DMSO ml was instilled into the bladder. The catheter was then removed. Patient tolerated well, no complications were noted Patient held in bladder for 30 minutes prior to procedure starting.   Performed by: Tenessa Marsee, lpn  Follow up/ Additional notes: Keep next scheduled NV

## 2020-05-31 DIAGNOSIS — L304 Erythema intertrigo: Secondary | ICD-10-CM | POA: Diagnosis not present

## 2020-05-31 DIAGNOSIS — L280 Lichen simplex chronicus: Secondary | ICD-10-CM | POA: Diagnosis not present

## 2020-05-31 DIAGNOSIS — L570 Actinic keratosis: Secondary | ICD-10-CM | POA: Diagnosis not present

## 2020-06-02 DIAGNOSIS — L84 Corns and callosities: Secondary | ICD-10-CM | POA: Diagnosis not present

## 2020-06-02 DIAGNOSIS — E1142 Type 2 diabetes mellitus with diabetic polyneuropathy: Secondary | ICD-10-CM | POA: Diagnosis not present

## 2020-06-02 DIAGNOSIS — B351 Tinea unguium: Secondary | ICD-10-CM | POA: Diagnosis not present

## 2020-06-05 ENCOUNTER — Other Ambulatory Visit: Payer: Self-pay | Admitting: Family Medicine

## 2020-06-08 ENCOUNTER — Other Ambulatory Visit: Payer: Self-pay

## 2020-06-08 MED ORDER — METFORMIN HCL 500 MG PO TABS: ORAL_TABLET | ORAL | 0 refills | Status: DC

## 2020-06-13 ENCOUNTER — Telehealth: Payer: Self-pay | Admitting: Family Medicine

## 2020-06-13 NOTE — Telephone Encounter (Signed)
Pharmacy requesting refill on Nateglinide 120 gm tablet. Take one tablet po three times a day before meals. Pt last seen 02/04/20 for wellness. Please advise. Thank you

## 2020-06-14 NOTE — Telephone Encounter (Signed)
Please contact patient to have her set up appt with Dr.Taylor. thank you! 

## 2020-06-14 NOTE — Telephone Encounter (Signed)
Needs appt, hasn't been seen by me. Thx. Dr. Darene Lamer

## 2020-06-21 ENCOUNTER — Encounter: Payer: Self-pay | Admitting: Gastroenterology

## 2020-06-21 ENCOUNTER — Ambulatory Visit: Payer: Medicare Other | Admitting: Urology

## 2020-06-21 ENCOUNTER — Ambulatory Visit (INDEPENDENT_AMBULATORY_CARE_PROVIDER_SITE_OTHER): Payer: Medicare Other | Admitting: Urology

## 2020-06-21 ENCOUNTER — Encounter: Payer: Self-pay | Admitting: Urology

## 2020-06-21 ENCOUNTER — Other Ambulatory Visit: Payer: Self-pay

## 2020-06-21 VITALS — BP 105/60 | HR 83 | Temp 98.0°F

## 2020-06-21 DIAGNOSIS — Z87440 Personal history of urinary (tract) infections: Secondary | ICD-10-CM

## 2020-06-21 DIAGNOSIS — N3010 Interstitial cystitis (chronic) without hematuria: Secondary | ICD-10-CM

## 2020-06-21 LAB — URINALYSIS, ROUTINE W REFLEX MICROSCOPIC
Bilirubin, UA: NEGATIVE
Glucose, UA: NEGATIVE
Ketones, UA: NEGATIVE
Nitrite, UA: NEGATIVE
Protein,UA: NEGATIVE
Specific Gravity, UA: 1.01 (ref 1.005–1.030)
Urobilinogen, Ur: 0.2 mg/dL (ref 0.2–1.0)
pH, UA: 6 (ref 5.0–7.5)

## 2020-06-21 LAB — MICROSCOPIC EXAMINATION: Renal Epithel, UA: NONE SEEN /hpf

## 2020-06-21 MED ORDER — DIMETHYL SULFOXIDE 50 % IS SOLN
50.0000 mL | Freq: Once | INTRAVESICAL | Status: AC
  Administered 2020-06-21: 50 mL via URETHRAL

## 2020-06-21 MED ORDER — HYDROCORTISONE NA SUCCINATE PF 100 MG IJ SOLR
100.0000 mg | Freq: Once | INTRAMUSCULAR | Status: AC
  Administered 2020-06-21: 100 mg

## 2020-06-21 MED ORDER — GEMTESA 75 MG PO TABS
1.0000 | ORAL_TABLET | Freq: Every day | ORAL | 0 refills | Status: DC
Start: 2020-06-21 — End: 2020-09-19

## 2020-06-21 MED ORDER — METHENAMINE HIPPURATE 1 G PO TABS: 1.0000 g | ORAL_TABLET | Freq: Two times a day (BID) | ORAL | 11 refills | Status: DC

## 2020-06-21 MED ORDER — SODIUM BICARBONATE 8.4 % IV SOLN
50.0000 meq | Freq: Once | INTRAVENOUS | Status: AC
  Administered 2020-06-21: 50 meq via INTRAVENOUS

## 2020-06-21 MED ORDER — LIDOCAINE HCL 1 % IJ SOLN
20.0000 mL | Freq: Once | INTRAMUSCULAR | Status: AC
  Administered 2020-06-21: 20 mL

## 2020-06-21 MED ORDER — MIRABEGRON ER 50 MG PO TB24: 50.0000 mg | ORAL_TABLET | Freq: Every day | ORAL | 3 refills | Status: DC

## 2020-06-21 NOTE — Progress Notes (Signed)
Bladder Instillation  Due to IC patient is present today for a Bladder Instillation of RIMSO. Patient was cleaned and prepped in a sterile fashion with betadine and lidocaine 2% jelly was instilled into the urethra.  A 18FR catheter was inserted, urine return was noted 168ml, urine was yellow in color.  50 ml was instilled into the bladder.Patient held in bladder for 20 minutes. The catheter was then removed. Patient tolerated well, no complications were noted   Performed by: Walden Statz, Lpn  Follow up/ Additional notes: Keep next scheduled NV

## 2020-06-21 NOTE — Patient Instructions (Signed)
Overactive Bladder, Adult  Overactive bladder is a condition in which a person has a sudden and frequent need to urinate. A person might also leak urine if he or she cannot get to the bathroom fast enough (urinary incontinence). Sometimes, symptoms can interfere with work or social activities. What are the causes? Overactive bladder is associated with poor nerve signals between your bladder and your brain. Your bladder may get the signal to empty before it is full. You may also have very sensitive muscles that make your bladder squeeze too soon. This condition may also be caused by other factors, such as:  Medical conditions: ? Urinary tract infection. ? Infection of nearby tissues. ? Prostate enlargement. ? Bladder stones, inflammation, or tumors. ? Diabetes. ? Muscle or nerve weakness, especially from these conditions:  A spinal cord injury.  Stroke.  Multiple sclerosis.  Parkinson's disease.  Other causes: ? Surgery on the uterus or urethra. ? Drinking too much caffeine or alcohol. ? Certain medicines, especially those that eliminate extra fluid in the body (diuretics). ? Constipation. What increases the risk? You may be at greater risk for overactive bladder if you:  Are an older adult.  Smoke.  Are going through menopause.  Have prostate problems.  Have a neurological disease, such as stroke, dementia, Parkinson's disease, or multiple sclerosis (MS).  Eat or drink alcohol, spicy food, caffeine, and other things that irritate the bladder.  Are overweight or obese. What are the signs or symptoms? Symptoms of this condition include a sudden, strong urge to urinate. Other symptoms include:  Leaking urine.  Urinating 8 or more times a day.  Waking up to urinate 2 or more times overnight. How is this diagnosed? This condition may be diagnosed based on:  Your symptoms and medical history.  A physical exam.  Blood or urine tests to check for possible causes,  such as infection. You may also need to see a health care provider who specializes in urinary tract problems. This is called a urologist. How is this treated? Treatment for overactive bladder depends on the cause of your condition and whether it is mild or severe. Treatment may include:  Bladder training, such as: ? Learning to control the urge to urinate by following a schedule to urinate at regular intervals. ? Doing Kegel exercises to strengthen the pelvic floor muscles that support your bladder.  Special devices, such as: ? Biofeedback. This uses sensors to help you become aware of your body's signals. ? Electrical stimulation. This uses electrodes placed inside the body (implanted) or outside the body. These electrodes send gentle pulses of electricity to strengthen the nerves or muscles that control the bladder. ? Women may use a plastic device, called a pessary, that fits into the vagina and supports the bladder.  Medicines, such as: ? Antibiotics to treat bladder infection. ? Antispasmodics to stop the bladder from releasing urine at the wrong time. ? Tricyclic antidepressants to relax bladder muscles. ? Injections of botulinum toxin type A directly into the bladder tissue to relax bladder muscles.  Surgery, such as: ? A device may be implanted to help manage the nerve signals that control urination. ? An electrode may be implanted to stimulate electrical signals in the bladder. ? A procedure may be done to change the shape of the bladder. This is done only in very severe cases. Follow these instructions at home: Eating and drinking  Make diet or lifestyle changes recommended by your health care provider. These may include: ? Drinking fluids   throughout the day and not only with meals. ? Cutting down on caffeine or alcohol. ? Eating a healthy and balanced diet to prevent constipation. This may include:  Choosing foods that are high in fiber, such as beans, whole grains, and  fresh fruits and vegetables.  Limiting foods that are high in fat and processed sugars, such as fried and sweet foods.   Lifestyle  Lose weight if needed.  Do not use any products that contain nicotine or tobacco. These include cigarettes, chewing tobacco, and vaping devices, such as e-cigarettes. If you need help quitting, ask your health care provider.   General instructions  Take over-the-counter and prescription medicines only as told by your health care provider.  If you were prescribed an antibiotic medicine, take it as told by your health care provider. Do not stop taking the antibiotic even if you start to feel better.  Use any implants or pessary as told by your health care provider.  If needed, wear pads to absorb urine leakage.  Keep a log to track how much and when you drink, and when you need to urinate. This will help your health care provider monitor your condition.  Keep all follow-up visits. This is important. Contact a health care provider if:  You have a fever or chills.  Your symptoms do not get better with treatment.  Your pain and discomfort get worse.  You have more frequent urges to urinate. Get help right away if:  You are not able to control your bladder. Summary  Overactive bladder refers to a condition in which a person has a sudden and frequent need to urinate.  Several conditions may lead to an overactive bladder.  Treatment for overactive bladder depends on the cause and severity of your condition.  Making lifestyle changes, doing Kegel exercises, keeping a log, and taking medicines can help with this condition. This information is not intended to replace advice given to you by your health care provider. Make sure you discuss any questions you have with your health care provider. Document Revised: 10/25/2019 Document Reviewed: 10/25/2019 Elsevier Patient Education  2021 [employer] Inc[employer]  

## 2020-06-21 NOTE — Progress Notes (Signed)
06/21/2020 11:07 AM   Deborah Gray [birth year] [id number]  Referring provider: Erven Colla, DO [place],  [place] [address]  followupo recurrent UTI and IC  HPI: [name] is a 87yo here for followup for IC and recurrent UTI. No UTis since last visit. She remains on hiprex daily. She has stable urinary urgency and frequency for which she takes mirabegron $RemoveBeforeDE'50mg'hGqLemKShwHhyVQ$  daily. She is interested in trying a different medication to improve her urinary urgency. She is also requesting and IC instillation today   PMH: Past Medical History:  Diagnosis Date  . Arthritis   . Benign neoplasm of colon   . Constipation   . Diaphragmatic hernia without mention of obstruction or gangrene   . Diverticulosis of colon (without mention of hemorrhage)   . Dysphagia, pharyngoesophageal phase   . Esophageal reflux   . Essential hypertension   . Heart murmur   . Hemorrhoids   . Hyperlipidemia   . Internal hemorrhoids without mention of complication   . Left wrist fracture   . PONV (postoperative nausea and vomiting)   . Stricture and stenosis of esophagus   . Type 2 diabetes mellitus ([place])   . Unspecified gastritis and gastroduodenitis without mention of hemorrhage     Surgical History: Past Surgical History:  Procedure Laterality Date  . ABDOMINAL HYSTERECTOMY    . BACTERIAL OVERGROWTH TEST N/A 09/06/2015   Procedure: BACTERIAL OVERGROWTH TEST;  Surgeon: Daneil Dolin, MD;  [facility]: [facility];  Service: Endoscopy;  Laterality: N/A;  800  . BREAST LUMPECTOMY Right    benign  . CATARACT EXTRACTION Bilateral 2006   Implants in both, surgeries done 6 weeks apart  . CHOLECYSTECTOMY    . COLONOSCOPY  03/08/02   [name]: diverticulosis, internal hemorrhoids, adenomatous colon polyp  . COLONOSCOPY  01/23/04   [name]: diverticulosis, internal hemorrhoids  . COLONOSCOPY  09/25/05   [name]: diverticulosis  . COLONOSCOPY  07/05/09   [name]: severe diverticulosis  .  COLONOSCOPY  01/21/11   [name]: severe diverticulosis in sigmoid to desc colon, int hemorrhoids, follow up TCS in 5 years  . COLONOSCOPY N/A 04/10/2016   [name]: diverticulosis  . DILATION AND CURETTAGE OF UTERUS    . ESOPHAGEAL MANOMETRY  03/21/08   Patterson: findings c/w [place] Esophagus  . ESOPHAGOGASTRODUODENOSCOPY  03/08/02   [name]: esophageal stricture, chronic gerd, s/p dilation.   . ESOPHAGOGASTRODUODENOSCOPY  01/23/04   [name]: esophageal stricture, gastritis, hiatal hernia  . ESOPHAGOGASTRODUODENOSCOPY  09/25/05   Patterson: gastritis, benign bx, no H.pylori  . ESOPHAGOGASTRODUODENOSCOPY  07/05/09   Patterson: gastropathy, benign small bowel bx and gastric bx  . ESOPHAGOGASTRODUODENOSCOPY N/A 05/15/2015   Dr. Gala Romney- diffuse moderate inflammation characterized by congestion (edema), erythema, and linear erosions was found in the entire examined stomach. bx= reactive gastropathy  . FOOT SURGERY Left   . HEMORRHOID BANDING  2017   Dr.[name]  . LAPAROSCOPIC VAGINAL HYSTERECTOMY    . ORIF WRIST FRACTURE Left 06/23/2018   Procedure: OPEN REDUCTION INTERNAL FIXATION (ORIF) LEFT WRIST FRACTURE;  Surgeon: Renette Butters, MD;  Location: [place];  Service: Orthopedics;  Laterality: Left;  regional arm block  . RECTOCELE REPAIR    . TOTAL KNEE ARTHROPLASTY Right     Home Medications:  Allergies as of 06/21/2020      Reactions   Adhesive [tape] Other (See Comments)   Reaction:  Blisters    Estrogens Other (See Comments)   Reaction:  Migraines    Sulfonamide  Derivatives Rash      Medication List       Accurate as of Jun 21, 2020 11:07 AM. If you have any questions, ask your nurse or doctor.        STOP taking these medications   cefUROXime 250 MG tablet Commonly known as: CEFTIN Stopped by: [name], MD   nitrofurantoin (macrocrystal-monohydrate) 100 MG capsule Commonly known as: MACROBID Stopped by: [name], MD     TAKE these  medications   acetaminophen 500 MG tablet Commonly known as: TYLENOL Take 500 mg by mouth at bedtime.   aspirin EC 81 MG tablet Take 81 mg by mouth at bedtime.   blood glucose meter kit and supplies Kit Dispense based on  insurance preference. Tests once a day E11.9   cetirizine 10 MG tablet Commonly known as: ZYRTEC Take 10 mg by mouth daily.   clidinium-chlordiazePOXIDE 5-2.5 MG capsule Commonly known as: LIBRAX Take 1 capsule by mouth 4 (four) times daily as needed.   clobetasol 0.05 % external solution Commonly known as: TEMOVATE SMARTSIG:6-8 Drop(s) Topical Daily   Creon 24000-76000 units Cpep Generic drug: Pancrelipase (Lip-Prot-Amyl) TAKE 3 CAPSULES THREE TIMES DAILY WITH MEALS AND ONE CAPSULE WITH SNACK TWICE DAILY   Ester-C 500-60 MG Tabs Take 1 tablet by mouth daily.   ezetimibe 10 MG tablet Commonly known as: ZETIA TAKE 1 TABLET BY MOUTH EVERY DAY   fluticasone 50 MCG/ACT nasal spray Commonly known as: FLONASE SPRAY 2 SPRAYS INTO EACH NOSTRIL EVERY DAY   guaiFENesin 600 MG 12 hr tablet Commonly known as: MUCINEX Take 600 mg by mouth daily.   hyoscyamine 0.125 MG SL tablet Commonly known as: LEVSIN SL USE 1 TABLET UNDER THE TONGUE BEFORE MEALS AND AT BEDTIME AS NEEDED FOR FLARES IN SYMPTOMS   lisinopril 20 MG tablet Commonly known as: ZESTRIL Take 1 tablet (20 mg total) by mouth daily.   losartan 50 MG tablet Commonly known as: COZAAR Take 1 tablet by mouth daily.   meclizine 25 MG tablet Commonly known as: ANTIVERT TAKE 1 TABLET BY MOUTH 3 TIMES A DAY AS NEEDED   metFORMIN 500 MG tablet Commonly known as: GLUCOPHAGE TAKE ONE TABLET BY MOUTH EVERY MORNING ONE AT LUNCH AND 2 AT BEDTIME   methenamine 1 g tablet Commonly known as: HIPREX Take 1 g by mouth 2 (two) times daily.   mirabegron ER 50 MG Tb24 tablet Commonly known as: Myrbetriq Take 1 tablet (50 mg total) by mouth daily. What changed: Another medication with the same name was  removed. Continue taking this medication, and follow the directions you see here. Changed by: [name], MD   nateglinide 120 MG tablet Commonly known as: STARLIX TAKE 1 TABLET BY MOUTH 3 TIMES A DAY BEFORE MEALS   NON FORMULARY [facility] probiotic  Once a day   omega-3 acid ethyl esters 1 g capsule Commonly known as: LOVAZA Take 3 g by mouth daily.   omeprazole 20 MG capsule Commonly known as: PRILOSEC TAKE 1 CAPSULE BY MOUTH EVERY DAY   OVER THE COUNTER MEDICATION Vitamin D     One a day  Not sure strength   PreserVision AREDS 2 Caps Take 1 capsule by mouth 2 (two) times daily.   Theratears 0.25 % Soln Generic drug: Carboxymethylcellulose Sodium Apply 1 drop to eye 2 (two) times daily.       Allergies:  Allergies  Allergen Reactions  . Adhesive [Tape] Other (See Comments)    Reaction:  Blisters   .  Estrogens Other (See Comments)    Reaction:  Migraines   . Sulfonamide Derivatives Rash    Family History: Family History  Problem Relation Age of Onset  . Ovarian cancer Mother   . Diabetes Father   . Heart disease Father   . Breast cancer Sister   . Liver cancer Sister   . Colon cancer Cousin        Paternal side  . Diabetes Maternal Aunt   . Liver disease Cousin        Maternal side, never drank    Social History:  reports that she has never smoked. She has never used smokeless tobacco. She reports that she does not drink alcohol and does not use drugs.  ROS: All other review of systems were reviewed and are negative except what is noted above in HPI  Physical Exam: BP 105/60   Pulse 83   Temp 98 F (36.7 C)   Constitutional:  Alert and oriented, No acute distress. HEENT: White AT, moist mucus membranes.  Trachea midline, no masses. Cardiovascular: No clubbing, cyanosis, or edema. Respiratory: Normal respiratory effort, no increased work of breathing. GI: Abdomen is soft, nontender, nondistended, no abdominal masses GU: No CVA tenderness.   Lymph: No cervical or inguinal lymphadenopathy. Skin: No rashes, bruises or suspicious lesions. Neurologic: Grossly intact, no focal deficits, moving all 4 extremities. Psychiatric: Normal mood and affect.  Laboratory Data: Lab Results  Component Value Date   WBC 8.0 05/25/2019   HGB 13.9 05/25/2019   HCT 43.7 05/25/2019   MCV 91.4 05/25/2019   PLT 261 05/25/2019    Lab Results  Component Value Date   CREATININE 0.85 03/15/2020    No results found for: PSA  No results found for: TESTOSTERONE  Lab Results  Component Value Date   HGBA1C 6.3 (H) 01/27/2020    Urinalysis    Component Value Date/Time   COLORURINE YELLOW 01/13/2019 1618   APPEARANCEUR Clear 05/22/2020 1416   LABSPEC 1.011 01/13/2019 1618   PHURINE 6.0 01/13/2019 1618   GLUCOSEU Negative 05/22/2020 1416   HGBUR LARGE (A) 01/13/2019 1618   BILIRUBINUR Negative 05/22/2020 1416   KETONESUR NEGATIVE 01/13/2019 1618   PROTEINUR Negative 05/22/2020 1416   PROTEINUR NEGATIVE 01/13/2019 1618   UROBILINOGEN 0.2 08/18/2019 1326   UROBILINOGEN 0.2 04/08/2008 1454   NITRITE Negative 05/22/2020 1416   NITRITE NEGATIVE 01/13/2019 1618   LEUKOCYTESUR Negative 05/22/2020 1416   LEUKOCYTESUR TRACE (A) 01/13/2019 1618    Lab Results  Component Value Date   LABMICR See below: 05/22/2020   WBCUA 0-5 05/22/2020   LABEPIT 0-10 05/22/2020   MUCUS Present 03/22/2020   BACTERIA None seen 05/22/2020    Pertinent Imaging:  No results found for this or any previous visit.  No results found for this or any previous visit.  No results found for this or any previous visit.  No results found for this or any previous visit.  No results found for this or any previous visit.  No results found for this or any previous visit.  No results found for this or any previous visit.  No results found for this or any previous visit.   Assessment & Plan:    1. Interstitial cystitis -Instillation performed today -  Urinalysis, Routine w reflex microscopic  2. OAB -We will trial gemtesa $RemoveBefor'75mg'[id number]$  daily  3. Recurrent UTI -Hiprex 1g daily   No follow-ups on file.  [name], MD  [facility] [place]

## 2020-06-21 NOTE — Progress Notes (Signed)
Urological Symptom Review  Patient is experiencing the following symptoms: Frequent urination Hard to postpone urination Get up at night to urinate Leakage of urine   Review of Systems  Gastrointestinal (upper)  : Indigestion/heartburn  Gastrointestinal (lower) : Diarrhea Constipation  Constitutional : Night Sweats Fatigue  Skin: Skin rash/lesion Itching  Eyes: Blurred vision  Ear/Nose/Throat : Sinus problems  Hematologic/Lymphatic: Easy bruising  Cardiovascular : Leg swelling  Respiratory : Shortness of breath  Endocrine: Negative for endocrine symptoms  Musculoskeletal: Back pain Joint pain  Neurological: Negative for neurological symptoms  Psychologic: Negative for psychiatric symptoms

## 2020-06-26 ENCOUNTER — Other Ambulatory Visit: Payer: Self-pay

## 2020-06-26 ENCOUNTER — Telehealth: Payer: Self-pay

## 2020-06-26 DIAGNOSIS — E1100 Type 2 diabetes mellitus with hyperosmolarity without nonketotic hyperglycemic-hyperosmolar coma (NKHHC): Secondary | ICD-10-CM

## 2020-06-26 MED ORDER — NATEGLINIDE 120 MG PO TABS: ORAL_TABLET | ORAL | 0 refills | Status: DC

## 2020-06-26 NOTE — Telephone Encounter (Signed)
Pt needs refill on nateglinide (STARLIX) 120 MG tablet [id number] - [place], [place] - [address] AT [place] Pt has med Check appt May 12   Pt call back [phone]

## 2020-06-26 NOTE — Telephone Encounter (Signed)
Patient scheduled appointment for 5/12

## 2020-06-27 DIAGNOSIS — E119 Type 2 diabetes mellitus without complications: Secondary | ICD-10-CM | POA: Diagnosis not present

## 2020-06-27 DIAGNOSIS — H34832 Tributary (branch) retinal vein occlusion, left eye, with macular edema: Secondary | ICD-10-CM | POA: Diagnosis not present

## 2020-06-28 ENCOUNTER — Ambulatory Visit: Payer: Medicare Other | Admitting: Urology

## 2020-06-28 NOTE — Telephone Encounter (Signed)
Reroute

## 2020-06-28 NOTE — Telephone Encounter (Signed)
Please advise. Thank you

## 2020-06-28 NOTE — Telephone Encounter (Signed)
Pt contacted and verbalized understanding.  

## 2020-06-29 ENCOUNTER — Ambulatory Visit (INDEPENDENT_AMBULATORY_CARE_PROVIDER_SITE_OTHER): Payer: Medicare Other | Admitting: Family Medicine

## 2020-06-29 ENCOUNTER — Other Ambulatory Visit: Payer: Self-pay

## 2020-06-29 ENCOUNTER — Encounter: Payer: Self-pay | Admitting: Family Medicine

## 2020-06-29 VITALS — BP 138/65 | HR 77 | Temp 97.3°F | Ht 64.0 in | Wt 140.0 lb

## 2020-06-29 DIAGNOSIS — E782 Mixed hyperlipidemia: Secondary | ICD-10-CM | POA: Diagnosis not present

## 2020-06-29 DIAGNOSIS — I10 Essential (primary) hypertension: Secondary | ICD-10-CM

## 2020-06-29 DIAGNOSIS — E1100 Type 2 diabetes mellitus with hyperosmolarity without nonketotic hyperglycemic-hyperosmolar coma (NKHHC): Secondary | ICD-10-CM | POA: Diagnosis not present

## 2020-06-29 DIAGNOSIS — E875 Hyperkalemia: Secondary | ICD-10-CM | POA: Diagnosis not present

## 2020-06-29 NOTE — Progress Notes (Signed)
Patient ID: Deborah Gray, female    DOB: [birth year], 87 y.o.   MRN: [id number]   Chief Complaint  Patient presents with  . Hypertension  . Diabetes  . Hyperlipidemia   Subjective:    HPI  Htn, dm2, hld-med check up. Takes meds every day. Stays active. Health conscious with diet.   flonase and mucinex and pt stating has sinus infections happen frequently. occ has wax in ears.  Hasn't had abx for sinuses since fall when seen by NP.  Husband passed away 08-20-22.  Thought Peanut butter was inc her potassium. Has stopped eating it.   Last a1c- 6.3.   Dm2- Compliant with medications. Checking blood glucose.   Not seeing any high or low numbers.  Denies polyuria or polydipsia.  Eye exam: h/o macular degeneration, pt stating up to date Foot exam: no new concerns Has h/o macular degeneration and getting an injection in rt eye. Going every 7 wks.  Her vision was changing with her macular deg.   Seeing GI and cards.  Getting her diabetic medications from [place].  Went to dermatologist and had some dry scalp. Then thought bp was causing itching all over. Cardiology switched her bp medication.  Itching is better, not taking losartan.  Now on $Remo'20mg'KPiya$  lisinopril.  Had echo in 1/22 and was good EF 60-65% Pt stating had rheumatic fever as a child.  HTN Pt compliant with BP meds.  No SEs Denies chest pain, sob, LE swelling, or blurry vision.  HLD- doing well no new concerns.  Compliant with meds. No chest pain, palpitations, myalgias or joint pains. Taking zetia and lovaza.    [place] has a past medical history of Arthritis, Benign neoplasm of colon, Constipation, Diaphragmatic hernia without mention of obstruction or gangrene, Diverticulosis of colon (without mention of hemorrhage), Dysphagia, pharyngoesophageal phase, Esophageal reflux, Essential hypertension, Heart murmur, Hemorrhoids, Hyperlipidemia, Internal hemorrhoids without mention of complication, Left  wrist fracture, PONV (postoperative nausea and vomiting), Stricture and stenosis of esophagus, Type 2 diabetes mellitus ([place]), and Unspecified gastritis and gastroduodenitis without mention of hemorrhage.   Outpatient Encounter Medications as of 06/29/2020  Medication Sig  . acetaminophen (TYLENOL) 500 MG tablet Take 500 mg by mouth at bedtime.  Marland Kitchen aspirin EC 81 MG tablet Take 81 mg by mouth at bedtime.  . blood glucose meter kit and supplies KIT Dispense based on  insurance preference. Tests once a day E11.9  . Carboxymethylcellulose Sodium (THERATEARS) 0.25 % SOLN Apply 1 drop to eye 2 (two) times daily.   . cetirizine (ZYRTEC) 10 MG tablet Take 10 mg by mouth daily.  . clidinium-chlordiazePOXIDE (LIBRAX) 5-2.5 MG capsule Take 1 capsule by mouth 4 (four) times daily as needed.  . clobetasol (TEMOVATE) 0.05 % external solution SMARTSIG:6-8 Drop(s) Topical Daily  . CREON 24000-76000 units CPEP TAKE 3 CAPSULES THREE TIMES DAILY WITH MEALS AND ONE CAPSULE WITH SNACK TWICE DAILY  . ezetimibe (ZETIA) 10 MG tablet TAKE 1 TABLET BY MOUTH EVERY DAY  . fluticasone (FLONASE) 50 MCG/ACT nasal spray SPRAY 2 SPRAYS INTO EACH NOSTRIL EVERY DAY  . guaiFENesin (MUCINEX) 600 MG 12 hr tablet Take 600 mg by mouth daily.  . hyoscyamine (LEVSIN SL) 0.125 MG SL tablet USE 1 TABLET UNDER THE TONGUE BEFORE MEALS AND AT BEDTIME AS NEEDED FOR FLARES IN SYMPTOMS  . losartan (COZAAR) 50 MG tablet Take 1 tablet by mouth daily.  . meclizine (ANTIVERT) 25 MG tablet TAKE 1 TABLET BY MOUTH 3 TIMES A DAY AS  NEEDED  . metFORMIN (GLUCOPHAGE) 500 MG tablet TAKE ONE TABLET BY MOUTH EVERY MORNING ONE AT LUNCH AND 2 AT BEDTIME  . methenamine (HIPREX) 1 g tablet Take 1 tablet (1 g total) by mouth 2 (two) times daily.  . mirabegron ER (MYRBETRIQ) 50 MG TB24 tablet Take 1 tablet (50 mg total) by mouth daily.  . Multiple Vitamins-Minerals (PRESERVISION AREDS 2) CAPS Take 1 capsule by mouth 2 (two) times daily.   . nateglinide (STARLIX)  120 MG tablet TAKE 1 TABLET BY MOUTH 3 TIMES A DAY BEFORE MEALS  . NON FORMULARY Phillips probiotic  Once a day  . omega-3 acid ethyl esters (LOVAZA) 1 g capsule Take 3 g by mouth daily.   Marland Kitchen omeprazole (PRILOSEC) 20 MG capsule TAKE 1 CAPSULE BY MOUTH EVERY DAY  . OVER THE COUNTER MEDICATION Vitamin D     One a day  Not sure strength  . Vibegron (GEMTESA) 75 MG TABS Take 1 capsule by mouth daily.  . Vitamin Mixture (ESTER-C) 500-60 MG TABS Take 1 tablet by mouth daily.   Marland Kitchen lisinopril (ZESTRIL) 20 MG tablet Take 1 tablet (20 mg total) by mouth daily.   No facility-administered encounter medications on file as of 06/29/2020.     Review of Systems  Constitutional: Negative for chills and fever.  HENT: Negative for congestion, rhinorrhea and sore throat.   Respiratory: Negative for cough, shortness of breath and wheezing.   Cardiovascular: Negative for chest pain and leg swelling.  Gastrointestinal: Negative for abdominal pain, diarrhea, nausea and vomiting.  Genitourinary: Negative for dysuria and frequency.  Musculoskeletal: Negative for arthralgias and back pain.  Skin: Negative for rash.  Neurological: Negative for dizziness, weakness and headaches.     Vitals BP 138/65   Pulse 77   Temp (!) 97.3 F (36.3 C)   Ht $R'5\' 4"'wy$  (1.626 m)   Wt 140 lb (63.5 kg)   SpO2 99%   BMI 24.03 kg/m   Objective:   Physical Exam Vitals and nursing note reviewed.  Constitutional:      Appearance: Normal appearance.  HENT:     Head: Normocephalic and atraumatic.     Nose: Nose normal.     Mouth/Throat:     Mouth: Mucous membranes are moist.     Pharynx: Oropharynx is clear.  Eyes:     Extraocular Movements: Extraocular movements intact.     Conjunctiva/sclera: Conjunctivae normal.     Pupils: Pupils are equal, round, and reactive to light.  Cardiovascular:     Rate and Rhythm: Normal rate and regular rhythm.     Pulses: Normal pulses.     Heart sounds: Normal heart sounds.  Pulmonary:      Effort: Pulmonary effort is normal.     Breath sounds: Normal breath sounds. No wheezing, rhonchi or rales.  Musculoskeletal:        General: Normal range of motion.     Right lower leg: No edema.     Left lower leg: No edema.  Skin:    General: Skin is warm and dry.     Findings: No lesion or rash.  Neurological:     General: No focal deficit present.     Mental Status: She is alert and oriented to person, place, and time.  Psychiatric:        Mood and Affect: Mood normal.        Behavior: Behavior normal.      Assessment and Plan   1. Type 2 diabetes  mellitus with hyperosmolarity without coma, without long-term current use of insulin (HCC) - CMP14+EGFR - Hemoglobin A1c - Lipid panel  2. Mixed hyperlipidemia - Lipid panel  3. Essential hypertension - CBC - CMP14+EGFR - Lipid panel  4. Hyperkalemia - Basic metabolic panel  5. Hypercalcemia - Basic metabolic panel   htn- improved, suboptimal.  Initially on exam was 914 systolic.  Pt may go back to taking $RemoveB'50mg'ZUyLgfIG$  losartan and discontinue $RemoveBeforeD'20mg'kldYlsbvbuEsKS$  lisinopril. Since pt not sure that the losartan was the cause of her rash.  Check your bp and if seeing high numbers call or rto. If having rash again, then stop and call office. On reck bp improved 138/65.   dm2- stable. Recheck labs and cont meds.  hld- stable. Cont f/u with cards.  H/o hypercalcemia and hyperkalemia- will recheck labs   Return in about 6 months (around 12/30/2020) for f/u dm2, htn.  07/16/2020   BP Readings from Last 3 Encounters:  06/29/20 138/65  06/21/20 105/60  03/22/20 (!) 151/77

## 2020-06-29 NOTE — Patient Instructions (Signed)
Go back to taking 50mg  losartan and discontinue 20mg  lisinopril. Check your bp and if seeing high numbers call office.

## 2020-07-03 DIAGNOSIS — E782 Mixed hyperlipidemia: Secondary | ICD-10-CM | POA: Diagnosis not present

## 2020-07-03 DIAGNOSIS — E1100 Type 2 diabetes mellitus with hyperosmolarity without nonketotic hyperglycemic-hyperosmolar coma (NKHHC): Secondary | ICD-10-CM | POA: Diagnosis not present

## 2020-07-03 DIAGNOSIS — I10 Essential (primary) hypertension: Secondary | ICD-10-CM | POA: Diagnosis not present

## 2020-07-04 LAB — HEMOGLOBIN A1C
Est. average glucose Bld gHb Est-mCnc: 131 mg/dL
Hgb A1c MFr Bld: 6.2 % — ABNORMAL HIGH (ref 4.8–5.6)

## 2020-07-04 LAB — CMP14+EGFR
ALT: 15 IU/L (ref 0–32)
AST: 13 IU/L (ref 0–40)
Albumin/Globulin Ratio: 1.9 (ref 1.2–2.2)
Albumin: 4.6 g/dL (ref 3.6–4.6)
Alkaline Phosphatase: 85 IU/L (ref 44–121)
BUN/Creatinine Ratio: 26 (ref 12–28)
BUN: 22 mg/dL (ref 8–27)
Bilirubin Total: 0.4 mg/dL (ref 0.0–1.2)
CO2: 25 mmol/L (ref 20–29)
Calcium: 10.4 mg/dL — ABNORMAL HIGH (ref 8.7–10.3)
Chloride: 103 mmol/L (ref 96–106)
Creatinine, Ser: 0.86 mg/dL (ref 0.57–1.00)
Globulin, Total: 2.4 g/dL (ref 1.5–4.5)
Glucose: 132 mg/dL — ABNORMAL HIGH (ref 65–99)
Potassium: 5.4 mmol/L — ABNORMAL HIGH (ref 3.5–5.2)
Sodium: 143 mmol/L (ref 134–144)
Total Protein: 7 g/dL (ref 6.0–8.5)
eGFR: 67 mL/min/{1.73_m2} (ref >59)

## 2020-07-04 LAB — CBC
Hematocrit: 43.2 % (ref 34.0–46.6)
Hemoglobin: 14.3 g/dL (ref 11.1–15.9)
MCH: 29.3 pg (ref 26.6–33.0)
MCHC: 33.1 g/dL (ref 31.5–35.7)
MCV: 89 fL (ref 79–97)
Platelets: 247 10*3/uL (ref 150–450)
RBC: 4.88 x10E6/uL (ref 3.77–5.28)
RDW: 13.9 % (ref 11.7–15.4)
WBC: 7.3 10*3/uL (ref 3.4–10.8)

## 2020-07-04 LAB — LIPID PANEL
Chol/HDL Ratio: 2.6 ratio (ref 0.0–4.4)
Cholesterol, Total: 189 mg/dL (ref 100–199)
HDL: 73 mg/dL (ref >39)
LDL Chol Calc (NIH): 97 mg/dL (ref 0–99)
Triglycerides: 108 mg/dL (ref 0–149)
VLDL Cholesterol Cal: 19 mg/dL (ref 5–40)

## 2020-07-12 DIAGNOSIS — E875 Hyperkalemia: Secondary | ICD-10-CM | POA: Diagnosis not present

## 2020-07-13 LAB — BASIC METABOLIC PANEL
BUN/Creatinine Ratio: 31 — ABNORMAL HIGH (ref 12–28)
BUN: 20 mg/dL (ref 8–27)
CO2: 24 mmol/L (ref 20–29)
Calcium: 10.2 mg/dL (ref 8.7–10.3)
Chloride: 105 mmol/L (ref 96–106)
Creatinine, Ser: 0.65 mg/dL (ref 0.57–1.00)
Glucose: 117 mg/dL — ABNORMAL HIGH (ref 65–99)
Potassium: 5 mmol/L (ref 3.5–5.2)
Sodium: 143 mmol/L (ref 134–144)
eGFR: 87 mL/min/{1.73_m2} (ref >59)

## 2020-07-14 ENCOUNTER — Telehealth: Payer: Self-pay | Admitting: *Deleted

## 2020-07-14 NOTE — Telephone Encounter (Signed)
  All normal.   Dr. Darene Lamer

## 2020-07-14 NOTE — Telephone Encounter (Signed)
Patient calling to check on the results of her repeat potassium level done 2 days ago.

## 2020-07-14 NOTE — Telephone Encounter (Signed)
Patient advised per Dr Lovena Le:  All normal.   Dr. Darene Lamer  Patient verbalized understanding.

## 2020-07-16 ENCOUNTER — Other Ambulatory Visit: Payer: Self-pay | Admitting: Family Medicine

## 2020-07-21 ENCOUNTER — Ambulatory Visit: Payer: Medicare Other | Admitting: Gastroenterology

## 2020-07-24 ENCOUNTER — Other Ambulatory Visit: Payer: Self-pay

## 2020-07-24 ENCOUNTER — Ambulatory Visit (INDEPENDENT_AMBULATORY_CARE_PROVIDER_SITE_OTHER): Payer: Medicare Other

## 2020-07-24 DIAGNOSIS — N3010 Interstitial cystitis (chronic) without hematuria: Secondary | ICD-10-CM | POA: Diagnosis not present

## 2020-07-24 LAB — MICROSCOPIC EXAMINATION
Epithelial Cells (non renal): NONE SEEN /hpf (ref 0–10)
Renal Epithel, UA: NONE SEEN /hpf

## 2020-07-24 LAB — URINALYSIS, ROUTINE W REFLEX MICROSCOPIC
Bilirubin, UA: NEGATIVE
Glucose, UA: NEGATIVE
Ketones, UA: NEGATIVE
Leukocytes,UA: NEGATIVE
Nitrite, UA: NEGATIVE
Protein,UA: NEGATIVE
Specific Gravity, UA: 1.01 (ref 1.005–1.030)
Urobilinogen, Ur: 0.2 mg/dL (ref 0.2–1.0)
pH, UA: 6.5 (ref 5.0–7.5)

## 2020-07-24 MED ORDER — LIDOCAINE HCL 1 % IJ SOLN
20.0000 mL | Freq: Once | INTRAMUSCULAR | Status: AC
  Administered 2020-07-24: 20 mL

## 2020-07-24 MED ORDER — HYDROCORTISONE SOD SUCCINATE 100 MG PF FOR IT USE
100.0000 mg | Freq: Once | INTRAMUSCULAR | Status: AC
  Administered 2020-07-24: 100 mg via INTRATHECAL

## 2020-07-24 MED ORDER — SODIUM BICARBONATE 8.4 % IV SOLN
50.0000 meq | Freq: Once | INTRAVENOUS | Status: AC
  Administered 2020-07-24: 50 meq

## 2020-07-24 MED ORDER — DIMETHYL SULFOXIDE 50 % IS SOLN
50.0000 mL | Freq: Once | INTRAVESICAL | Status: AC
  Administered 2020-07-24: 50 mL via URETHRAL

## 2020-07-24 NOTE — Progress Notes (Signed)
Bladder Instillation DMSO  Due to IC patient is present today for a Bladder Instillation of DMSO treatment. Patient was cleaned and prepped in a sterile fashion with betadine.  A 18FR catheter was inserted, urine return was noted 58ml, urine was yellow in color.  DMSO treatment was instilled into the bladder. The catheter was then removed. Patient tolerated well, no complications were noted pt was instructed to hold the instillation for 20 minutes and then will void it out. Pt voiced understanding.    Preformed by: Estill Bamberg RN  Follow up/ Additional notes: 1 month NV

## 2020-07-31 ENCOUNTER — Telehealth: Payer: Self-pay

## 2020-07-31 DIAGNOSIS — G8929 Other chronic pain: Secondary | ICD-10-CM

## 2020-07-31 DIAGNOSIS — R0989 Other specified symptoms and signs involving the circulatory and respiratory systems: Secondary | ICD-10-CM

## 2020-07-31 DIAGNOSIS — R519 Headache, unspecified: Secondary | ICD-10-CM

## 2020-07-31 NOTE — Telephone Encounter (Addendum)
Patient would like referral to ENT- Referral ordered in [place]. Patient notified.

## 2020-07-31 NOTE — Telephone Encounter (Signed)
Nurses-please weed through this issue.  If she would like to have a referral go ahead with referral.(I cannot tell if she also wants to be seen this week?  And the reason why?  Or if this is something that can be handled by Dr. Lovena Le next week if she needs to be seen thank you)

## 2020-07-31 NOTE — Telephone Encounter (Signed)
Pt was a referral for a ear noise and throat specialist. Also she wants to be checked because she is getting migraines and having sinus infections more often. She is having twinges and worried about this.   Pt call back [phone]

## 2020-08-02 DIAGNOSIS — M9903 Segmental and somatic dysfunction of lumbar region: Secondary | ICD-10-CM | POA: Diagnosis not present

## 2020-08-02 DIAGNOSIS — M9902 Segmental and somatic dysfunction of thoracic region: Secondary | ICD-10-CM | POA: Diagnosis not present

## 2020-08-02 DIAGNOSIS — S134XXA Sprain of ligaments of cervical spine, initial encounter: Secondary | ICD-10-CM | POA: Diagnosis not present

## 2020-08-02 DIAGNOSIS — M9901 Segmental and somatic dysfunction of cervical region: Secondary | ICD-10-CM | POA: Diagnosis not present

## 2020-08-07 ENCOUNTER — Other Ambulatory Visit: Payer: Self-pay | Admitting: Gastroenterology

## 2020-08-11 DIAGNOSIS — E1142 Type 2 diabetes mellitus with diabetic polyneuropathy: Secondary | ICD-10-CM | POA: Diagnosis not present

## 2020-08-11 DIAGNOSIS — B351 Tinea unguium: Secondary | ICD-10-CM | POA: Diagnosis not present

## 2020-08-11 DIAGNOSIS — L84 Corns and callosities: Secondary | ICD-10-CM | POA: Diagnosis not present

## 2020-08-14 DIAGNOSIS — M9902 Segmental and somatic dysfunction of thoracic region: Secondary | ICD-10-CM | POA: Diagnosis not present

## 2020-08-14 DIAGNOSIS — M9903 Segmental and somatic dysfunction of lumbar region: Secondary | ICD-10-CM | POA: Diagnosis not present

## 2020-08-14 DIAGNOSIS — M9901 Segmental and somatic dysfunction of cervical region: Secondary | ICD-10-CM | POA: Diagnosis not present

## 2020-08-14 DIAGNOSIS — S134XXA Sprain of ligaments of cervical spine, initial encounter: Secondary | ICD-10-CM | POA: Diagnosis not present

## 2020-08-15 DIAGNOSIS — H34832 Tributary (branch) retinal vein occlusion, left eye, with macular edema: Secondary | ICD-10-CM | POA: Diagnosis not present

## 2020-08-17 ENCOUNTER — Encounter: Payer: Self-pay | Admitting: Family Medicine

## 2020-08-17 ENCOUNTER — Other Ambulatory Visit: Payer: Self-pay

## 2020-08-17 ENCOUNTER — Ambulatory Visit (INDEPENDENT_AMBULATORY_CARE_PROVIDER_SITE_OTHER): Payer: Medicare Other | Admitting: Family Medicine

## 2020-08-17 VITALS — BP 134/72 | HR 93 | Temp 97.4°F | Ht 64.0 in | Wt 140.0 lb

## 2020-08-17 DIAGNOSIS — L03116 Cellulitis of left lower limb: Secondary | ICD-10-CM | POA: Diagnosis not present

## 2020-08-17 MED ORDER — CEPHALEXIN 500 MG PO CAPS: 500.0000 mg | ORAL_CAPSULE | Freq: Two times a day (BID) | ORAL | 0 refills | Status: DC

## 2020-08-17 MED ORDER — TRAMADOL HCL 50 MG PO TABS: 50.0000 mg | ORAL_TABLET | Freq: Three times a day (TID) | ORAL | 0 refills | Status: AC | PRN

## 2020-08-17 NOTE — Progress Notes (Signed)
Patient ID: Deborah Gray, female    DOB: 1934-01-15, 87 y.o.   MRN: [id number]   Chief Complaint  Patient presents with   Foot Pain   Subjective:    HPI Cc- left foot pain. (Plantar surface)  Left foot has small crease cracking. Using neosporin on it. And using Tylenol and don't helping much with pain. Was seeing chriopractor. Crease about 3 inch long.  Has h/o osteoarhtritis and seeing chiropractor and helping some.  Had a fall and gave some tramadol in the past, and stating this helps with her pain. Had some left and took some tramadol.  Pt stating not much relief from tramadol.    [place] has a past medical history of Arthritis, Benign neoplasm of colon, Constipation, Diaphragmatic hernia without mention of obstruction or gangrene, Diverticulosis of colon (without mention of hemorrhage), Dysphagia, pharyngoesophageal phase, Esophageal reflux, Essential hypertension, Heart murmur, Hemorrhoids, Hyperlipidemia, Internal hemorrhoids without mention of complication, Left wrist fracture, PONV (postoperative nausea and vomiting), Stricture and stenosis of esophagus, Type 2 diabetes mellitus ([place]), and Unspecified gastritis and gastroduodenitis without mention of hemorrhage.   Outpatient Encounter Medications as of 08/17/2020  Medication Sig   acetaminophen (TYLENOL) 500 MG tablet Take 500 mg by mouth at bedtime.   aspirin EC 81 MG tablet Take 81 mg by mouth at bedtime.   blood glucose meter kit and supplies KIT Dispense based on  insurance preference. Tests once a day E11.9   Carboxymethylcellulose Sodium (THERATEARS) 0.25 % SOLN Apply 1 drop to eye 2 (two) times daily.    cephALEXin (KEFLEX) 500 MG capsule Take 1 capsule (500 mg total) by mouth 2 (two) times daily.   cetirizine (ZYRTEC) 10 MG tablet Take 10 mg by mouth daily.   clidinium-chlordiazePOXIDE (LIBRAX) 5-2.5 MG capsule Take 1 capsule by mouth 4 (four) times daily as needed.   clobetasol (TEMOVATE)  0.05 % external solution SMARTSIG:6-8 Drop(s) Topical Daily   CREON 24000-76000 units CPEP TAKE 3 CAPSULES THREE TIMES DAILY WITH MEALS AND ONE CAPSULE WITH SNACK TWICE DAILY   ezetimibe (ZETIA) 10 MG tablet TAKE 1 TABLET BY MOUTH EVERY DAY   fluticasone (FLONASE) 50 MCG/ACT nasal spray SPRAY 2 SPRAYS INTO EACH NOSTRIL EVERY DAY   guaiFENesin (MUCINEX) 600 MG 12 hr tablet Take 600 mg by mouth daily.   hyoscyamine (LEVSIN SL) 0.125 MG SL tablet USE 1 TABLET UNDER THE TONGUE BEFORE MEALS AND AT BEDTIME AS NEEDED FOR FLARES IN SYMPTOMS   losartan (COZAAR) 50 MG tablet Take 1 tablet by mouth daily.   meclizine (ANTIVERT) 25 MG tablet TAKE 1 TABLET BY MOUTH 3 TIMES A DAY AS NEEDED   metFORMIN (GLUCOPHAGE) 500 MG tablet TAKE ONE TABLET BY MOUTH EVERY MORNING ONE AT LUNCH AND 2 AT BEDTIME   methenamine (HIPREX) 1 g tablet Take 1 tablet (1 g total) by mouth 2 (two) times daily.   mirabegron ER (MYRBETRIQ) 50 MG TB24 tablet Take 1 tablet (50 mg total) by mouth daily.   Multiple Vitamins-Minerals (PRESERVISION AREDS 2) CAPS Take 1 capsule by mouth 2 (two) times daily.    nateglinide (STARLIX) 120 MG tablet TAKE 1 TABLET BY MOUTH 3 TIMES A DAY BEFORE MEALS   NON FORMULARY Phillips probiotic  Once a day   omega-3 acid ethyl esters (LOVAZA) 1 g capsule Take 3 g by mouth daily.    omeprazole (PRILOSEC) 20 MG capsule TAKE 1 CAPSULE BY MOUTH EVERY DAY   OVER THE COUNTER MEDICATION Vitamin D  One a day  Not sure strength   [EXPIRED] traMADol (ULTRAM) 50 MG tablet Take 1 tablet (50 mg total) by mouth every 8 (eight) hours as needed for up to 7 days.   Vibegron (GEMTESA) 75 MG TABS Take 1 capsule by mouth daily.   Vitamin Mixture (ESTER-C) 500-60 MG TABS Take 1 tablet by mouth daily.    lisinopril (ZESTRIL) 20 MG tablet Take 1 tablet (20 mg total) by mouth daily.   No facility-administered encounter medications on file as of 08/17/2020.     Review of Systems  Constitutional:  Negative for chills and fever.   HENT:  Negative for congestion, rhinorrhea and sore throat.   Respiratory:  Negative for cough, shortness of breath and wheezing.   Cardiovascular:  Negative for chest pain and leg swelling.  Gastrointestinal:  Negative for abdominal pain, diarrhea, nausea and vomiting.  Genitourinary:  Negative for dysuria and frequency.  Musculoskeletal:  Positive for arthralgias (left foot pain). Negative for back pain.  Skin:  Negative for rash.  Neurological:  Negative for dizziness, weakness and headaches.    Vitals BP 134/72   Pulse 93   Temp (!) 97.4 F (36.3 C)   Ht $R'5\' 4"'Jz$  (1.626 m)   Wt 140 lb (63.5 kg)   SpO2 98%   BMI 24.03 kg/m   Objective:   Physical Exam Vitals and nursing note reviewed.  Constitutional:      General: She is not in acute distress.    Appearance: Normal appearance.  Musculoskeletal:        General: Tenderness present. Normal range of motion.     Comments: Fissure on crevice of the left great toe pad on plantar surface.  No drainage, mild erythema and tenderness.  Skin:    General: Skin is warm and dry.     Findings: Lesion (plantar left foot) present. No rash.  Neurological:     General: No focal deficit present.     Mental Status: She is alert.     Cranial Nerves: No cranial nerve deficit.  Psychiatric:        Mood and Affect: Mood normal.        Behavior: Behavior normal.     Assessment and Plan   1. Cellulitis of left foot - cephALEXin (KEFLEX) 500 MG capsule; Take 1 capsule (500 mg total) by mouth 2 (two) times daily.  Dispense: 14 capsule; Refill: 0 - traMADol (ULTRAM) 50 MG tablet; Take 1 tablet (50 mg total) by mouth every 8 (eight) hours as needed for up to 7 days.  Dispense: 21 tablet; Refill: 0   Gave keflex, and neosporin. Rest and elevated foot. Tramadol for pain prn. Call or rto if worsening pain.   Return if symptoms worsen or fail to improve.  09/03/2020

## 2020-08-22 ENCOUNTER — Ambulatory Visit (INDEPENDENT_AMBULATORY_CARE_PROVIDER_SITE_OTHER): Payer: Medicare Other

## 2020-08-22 ENCOUNTER — Other Ambulatory Visit: Payer: Self-pay

## 2020-08-22 DIAGNOSIS — N3010 Interstitial cystitis (chronic) without hematuria: Secondary | ICD-10-CM | POA: Diagnosis not present

## 2020-08-22 LAB — MICROSCOPIC EXAMINATION
Bacteria, UA: NONE SEEN
Epithelial Cells (non renal): NONE SEEN /hpf (ref 0–10)
Renal Epithel, UA: NONE SEEN /hpf
WBC, UA: NONE SEEN /hpf (ref 0–5)

## 2020-08-22 LAB — URINALYSIS, ROUTINE W REFLEX MICROSCOPIC
Bilirubin, UA: NEGATIVE
Glucose, UA: NEGATIVE
Ketones, UA: NEGATIVE
Leukocytes,UA: NEGATIVE
Nitrite, UA: NEGATIVE
Protein,UA: NEGATIVE
Specific Gravity, UA: 1.015 (ref 1.005–1.030)
Urobilinogen, Ur: 0.2 mg/dL (ref 0.2–1.0)
pH, UA: 7 (ref 5.0–7.5)

## 2020-08-22 MED ORDER — LIDOCAINE HCL 1 % IJ SOLN
20.0000 mL | Freq: Once | INTRAMUSCULAR | Status: AC
  Administered 2020-08-22: 20 mL

## 2020-08-22 MED ORDER — DIMETHYL SULFOXIDE 50 % IS SOLN
50.0000 mL | Freq: Once | INTRAVESICAL | Status: AC
  Administered 2020-08-22: 50 mL via URETHRAL

## 2020-08-22 MED ORDER — HYDROCORTISONE NA SUCCINATE PF 100 MG IJ SOLR
100.0000 mg | Freq: Once | INTRAMUSCULAR | Status: AC
  Administered 2020-08-22: 100 mg

## 2020-08-22 MED ORDER — SODIUM BICARBONATE 8.4 % IV SOLN
50.0000 meq | Freq: Once | INTRAVENOUS | Status: AC
  Administered 2020-08-22: 50 meq via INTRAVENOUS

## 2020-08-22 NOTE — Progress Notes (Signed)
Bladder Instillation  Due to IC patient is present today for a Bladder Instillation of DMSO. Patient was cleaned and prepped in a sterile fashion with betadine and lidocaine 2% jelly was instilled into the urethra.  A 20 FR catheter was inserted, urine return was noted 100 ml, urine was yellow in color.  DMSO was instilled into the bladder.  Patient tolerated well, no complications were noted Patient held in bladder for 20 minutes and then will void it out. The catheter will then be removed.  Performed by: Krisalyn Yankowski LPN  Follow up/ Additional notes: Keep next scheduled NV

## 2020-08-23 DIAGNOSIS — M9903 Segmental and somatic dysfunction of lumbar region: Secondary | ICD-10-CM | POA: Diagnosis not present

## 2020-08-23 DIAGNOSIS — S134XXA Sprain of ligaments of cervical spine, initial encounter: Secondary | ICD-10-CM | POA: Diagnosis not present

## 2020-08-23 DIAGNOSIS — M9901 Segmental and somatic dysfunction of cervical region: Secondary | ICD-10-CM | POA: Diagnosis not present

## 2020-08-23 DIAGNOSIS — M9902 Segmental and somatic dysfunction of thoracic region: Secondary | ICD-10-CM | POA: Diagnosis not present

## 2020-09-06 DIAGNOSIS — M9903 Segmental and somatic dysfunction of lumbar region: Secondary | ICD-10-CM | POA: Diagnosis not present

## 2020-09-06 DIAGNOSIS — S134XXA Sprain of ligaments of cervical spine, initial encounter: Secondary | ICD-10-CM | POA: Diagnosis not present

## 2020-09-06 DIAGNOSIS — M9901 Segmental and somatic dysfunction of cervical region: Secondary | ICD-10-CM | POA: Diagnosis not present

## 2020-09-06 DIAGNOSIS — M9902 Segmental and somatic dysfunction of thoracic region: Secondary | ICD-10-CM | POA: Diagnosis not present

## 2020-09-10 NOTE — Progress Notes (Signed)
Cardiology Office Note  Date: 09/11/2020   ID: [name], DOB 1933-10-17, MRN [id number]  PCP:  Deborah Colla, DO  Cardiologist:  None Electrophysiologist:  None   Chief Complaint: 6 month follow up.  History of Present Illness: Deborah Gray is a 87 y.o. female with a history of SVT, HTN, DM2, HLD, esophageal stricture and stenosis, dysphagia, diverticulosis. Hx of palpitations  Lat encounter with Deborah Pho PA,  After recent [facility] ED visit 06/04/2019. Visit  follow up secondary to complaints of worsening dyspnea and weakness with intermittent episodes of chest discomfort. CP worse after food intake for  The prior 2 weeks. Troponins were negative x 2. Her pain was thought likely due to GI issues. At follow up she complained of chest discomfort with activity and improvement with rest. Lexiscan stress test was ordered. She denied any recent palpitations. No currently on BB therapy. BP well controlled on Losartan. Currently on Zetia and ASA for hyperlipidemia.   She was last here for 46-month follow-up today.  She denied any recent hospitalizations or acute illnesses.  States she had been having some itching over the last few years.  She states she was told losartan could cause itching.  She was asking if possibly she could be switched to another medication for her blood pressure management.  Otherwise she complained of some chronic dyspnea which seem to be worsening to some degree.  She states she has a history of rheumatic heart disease..  She denied any orthostatic symptoms, CVA or TIA-like symptoms, palpitations or arrhythmias, orthostatic symptoms, PND, orthopnea, claudication-like symptoms, DVT or PE-like symptoms.  She d has some chronic lower extremity edema and venous varicosities.  Here for 38-month follow-up today.  At last visit her losartan had been stopped and she was started on lisinopril.  She states since that visit her primary care provider had placed her back on  losartan due to increasing blood pressures.  She states she is tolerating the losartan without any significant itching issues.  She states stopping the losartan did not significantly reduce the itching anyway per her statement.  She denies any current anginal or exertional symptoms, orthostatic symptoms, CVA or TIA-like symptoms, palpitations or arrhythmias, PND, orthopnea, DOE, SOB.  Blood pressure is well controlled on current therapy.  Current cardiac regimen includes losartan 50 mg daily, Zetia 10 mg daily, aspirin 81 mg daily.   Past Medical History:  Diagnosis Date   Arthritis    Benign neoplasm of colon    Constipation    Diaphragmatic hernia without mention of obstruction or gangrene    Diverticulosis of colon (without mention of hemorrhage)    Dysphagia, pharyngoesophageal phase    Esophageal reflux    Essential hypertension    Heart murmur    Hemorrhoids    Hyperlipidemia    Internal hemorrhoids without mention of complication    Left wrist fracture    PONV (postoperative nausea and vomiting)    Stricture and stenosis of esophagus    Type 2 diabetes mellitus (HCC)    Unspecified gastritis and gastroduodenitis without mention of hemorrhage     Past Surgical History:  Procedure Laterality Date   ABDOMINAL HYSTERECTOMY     BACTERIAL OVERGROWTH TEST N/A 09/06/2015   Procedure: BACTERIAL OVERGROWTH TEST;  Surgeon: Deborah Dolin, MD;  Location: [facility];  Service: Endoscopy;  Laterality: N/A;  800   BREAST LUMPECTOMY Right    benign   CATARACT EXTRACTION Bilateral 2006   Implants in  both, surgeries done 6 weeks apart   CHOLECYSTECTOMY     COLONOSCOPY  03/08/02   Deborah Gray: diverticulosis, internal hemorrhoids, adenomatous colon polyp   COLONOSCOPY  01/23/04   Deborah Gray: diverticulosis, internal hemorrhoids   COLONOSCOPY  09/25/05   Deborah Gray: diverticulosis   COLONOSCOPY  07/05/09   Deborah Gray: severe diverticulosis   COLONOSCOPY  01/21/11   Deborah Gray: severe  diverticulosis in sigmoid to desc colon, int hemorrhoids, follow up TCS in 5 years   COLONOSCOPY N/A 04/10/2016   Deborah Gray: diverticulosis   DILATION AND CURETTAGE OF UTERUS     ESOPHAGEAL MANOMETRY  03/21/08   Deborah Gray: findings c/w [place] Esophagus   ESOPHAGOGASTRODUODENOSCOPY  03/08/02   Deborah Gray: esophageal stricture, chronic gerd, s/p dilation.    ESOPHAGOGASTRODUODENOSCOPY  01/23/04   Deborah Gray: esophageal stricture, gastritis, hiatal hernia   ESOPHAGOGASTRODUODENOSCOPY  09/25/05   Deborah Gray: gastritis, benign bx, no H.pylori   ESOPHAGOGASTRODUODENOSCOPY  07/05/09   Deborah Gray: gastropathy, benign small bowel bx and gastric bx   ESOPHAGOGASTRODUODENOSCOPY N/A 05/15/2015   Dr. Gala Gray- diffuse moderate inflammation characterized by congestion (edema), erythema, and linear erosions was found in the entire examined stomach. bx= reactive gastropathy   FOOT SURGERY Left    HEMORRHOID BANDING  2017   Dr.[name]   LAPAROSCOPIC VAGINAL HYSTERECTOMY     ORIF WRIST FRACTURE Left 06/23/2018   Procedure: OPEN REDUCTION INTERNAL FIXATION (ORIF) LEFT WRIST FRACTURE;  Surgeon: Deborah Butters, MD;  Location: [facility];  Service: Orthopedics;  Laterality: Left;  regional arm block   RECTOCELE REPAIR     TOTAL KNEE ARTHROPLASTY Right     Current Outpatient Medications  Medication Sig Dispense Refill   acetaminophen (TYLENOL) 500 MG tablet Take 500 mg by mouth at bedtime.     aspirin EC 81 MG tablet Take 81 mg by mouth at bedtime.     blood glucose meter kit and supplies KIT Dispense based on  insurance preference. Tests once a day E11.9 1 each 0   Carboxymethylcellulose Sodium (THERATEARS) 0.25 % SOLN Apply 1 drop to eye 2 (two) times daily.      cetirizine (ZYRTEC) 10 MG tablet Take 10 mg by mouth daily.     clidinium-chlordiazePOXIDE (LIBRAX) 5-2.5 MG capsule Take 1 capsule by mouth 4 (four) times daily as needed. 120 capsule 3   clobetasol (TEMOVATE) 0.05 % external solution  SMARTSIG:6-8 Drop(s) Topical Daily     CREON 24000-76000 units CPEP TAKE 3 CAPSULES THREE TIMES DAILY WITH MEALS AND ONE CAPSULE WITH SNACK TWICE DAILY 990 capsule 2   ezetimibe (ZETIA) 10 MG tablet TAKE 1 TABLET BY MOUTH EVERY DAY 90 tablet 3   guaiFENesin (MUCINEX) 600 MG 12 hr tablet Take 600 mg by mouth daily.     hyoscyamine (LEVSIN SL) 0.125 MG SL tablet USE 1 TABLET UNDER THE TONGUE BEFORE MEALS AND AT BEDTIME AS NEEDED FOR FLARES IN SYMPTOMS 90 tablet 1   losartan (COZAAR) 50 MG tablet Take 50 mg by mouth daily.     meclizine (ANTIVERT) 25 MG tablet TAKE 1 TABLET BY MOUTH 3 TIMES A DAY AS NEEDED 30 tablet 1   metFORMIN (GLUCOPHAGE) 500 MG tablet TAKE ONE TABLET BY MOUTH EVERY MORNING ONE AT LUNCH AND 2 AT BEDTIME 360 tablet 0   methenamine (HIPREX) 1 g tablet Take 1 tablet (1 g total) by mouth 2 (two) times daily. 60 tablet 11   mirabegron ER (MYRBETRIQ) 50 MG TB24 tablet Take 1 tablet (50 mg total) by mouth daily. 90 tablet 3  Multiple Vitamins-Minerals (PRESERVISION AREDS 2) CAPS Take 1 capsule by mouth 2 (two) times daily.      nateglinide (STARLIX) 120 MG tablet TAKE 1 TABLET BY MOUTH 3 TIMES A DAY BEFORE MEALS 270 tablet 0   NON FORMULARY [place] probiotic  Once a day     omega-3 acid ethyl esters (LOVAZA) 1 g capsule Take 2 g by mouth daily.     omeprazole (PRILOSEC) 20 MG capsule TAKE 1 CAPSULE BY MOUTH EVERY DAY 90 capsule 3   OVER THE COUNTER MEDICATION Vitamin D     One a day  Not sure strength     Vibegron (GEMTESA) 75 MG TABS Take 1 capsule by mouth daily. 30 tablet 0   Vitamin Mixture (ESTER-C) 500-60 MG TABS Take 1 tablet by mouth daily.      No current facility-administered medications for this visit.   Allergies:  Adhesive [tape], Estrogens, and Sulfonamide derivatives   Social History: The patient  reports that she has never smoked. She has never used smokeless tobacco. She reports that she does not drink alcohol and does not use drugs.   Family History: The  patient's family history includes Breast cancer in her sister; Colon cancer in her cousin; Diabetes in her father and maternal aunt; Heart disease in her father; Liver cancer in her sister; Liver disease in her cousin; Ovarian cancer in her mother.   ROS:  Please see the history of present illness. Otherwise, complete review of systems is positive for none.  All other systems are reviewed and negative.   Physical Exam: VS:  BP 132/70   Pulse 80   Ht $R'5\' 4"'by$  (1.626 m)   Wt 142 lb (64.4 kg)   SpO2 99%   BMI 24.37 kg/m , BMI Body mass index is 24.37 kg/m.  Wt Readings from Last 3 Encounters:  09/11/20 142 lb (64.4 kg)  08/17/20 140 lb (63.5 kg)  06/29/20 140 lb (63.5 kg)    General: Patient appears comfortable at rest. Neck: Supple, no elevated JVP or carotid bruits, no thyromegaly. Lungs: Clear to auscultation, nonlabored breathing at rest. Cardiac: Regular rate and rhythm, no S3 or significant systolic murmur, no pericardial rub. Extremities: Mild non-pitting edema with venous varicosities, distal pulses 2+. Skin: Warm and dry. Musculoskeletal: No kyphosis. Neuropsychiatric: Alert and oriented x3, affect grossly appropriate.  ECG: 09/11/2020 sinus rhythm with first-degree AV block occasional premature ventricular complex.  Rate of 70.  Septal infarct age undetermined.  Recent Labwork: 03/15/2020: Magnesium 2.0 07/03/2020: ALT 15; AST 13; Hemoglobin 14.3; Platelets 247 07/12/2020: BUN 20; Creatinine, Ser 0.65; Potassium 5.0; Sodium 143     Component Value Date/Time   CHOL 189 07/03/2020 1120   TRIG 108 07/03/2020 1120   HDL 73 07/03/2020 1120   CHOLHDL 2.6 07/03/2020 1120   CHOLHDL 3.6 11/22/2013 0852   VLDL 29 11/22/2013 0852   LDLCALC 97 07/03/2020 1120    Other Studies Reviewed Today:  Echocardiogram 03/08/2020  1. Left ventricular ejection fraction, by estimation, is 60 to 65%. The left ventricle has normal function. The left ventricle has no regional wall motion  abnormalities. There is mild left ventricular hypertrophy. Left ventricular diastolic parameters are consistent with Grade I diastolic dysfunction (impaired relaxation). Elevated left atrial pressure. The average left ventricular global longitudinal strain is -17.6 %. The global longitudinal strain is normal. 2. Right ventricular systolic function is normal. The right ventricular size is normal. 3. Left atrial size was moderately dilated. 4. The mitral valve is normal in  structure. Trivial mitral valve regurgitation. No evidence of mitral stenosis. 5. The aortic valve is tricuspid. There is mild calcification of the aortic valve. There is mild thickening of the aortic valve. Aortic valve regurgitation is not visualized. No aortic stenosis is present. 6. The inferior vena cava is normal in size with greater than 50% respiratory variability, suggesting right atrial pressure of 3 mmHg. Comparison(s): Echocardiogram done 12/06/13 showed an EF of 65%.   NST 07/08/2019 Narrative & Impression  There was no ST segment deviation noted during stress. The study is normal. There are no perfusion defects consistent with prior infarct or current ischemia. This is a low risk study. The left ventricular ejection fraction is normal (55-65%).     Echocardiogram: 11/2013 Study Conclusions   - Left ventricle: The cavity size was normal. Wall thickness was    increased in a pattern of mild LVH. Systolic function was    vigorous. The estimated ejection fraction was in the range of 65%    to 70%. Wall motion was normal; there were no regional wall    motion abnormalities. Doppler parameters are consistent with    abnormal left ventricular relaxation (grade 1 diastolic    dysfunction). Doppler parameters are consistent with elevated    ventricular end-diastolic filling pressure.  - Aortic valve: Trileaflet; mildly calcified leaflets. Moderate    thickening and calcification involving the left coronary  cusp.    There was no significant regurgitation.  - Mitral valve: There was trivial regurgitation.  - Right atrium: Central venous pressure (est): 3 mm Hg.  - Atrial septum: No defect or patent foramen ovale was identified.  - Tricuspid valve: There was trivial regurgitation.  - Pulmonary arteries: PA peak pressure: 32 mm Hg (S).  - Pericardium, extracardiac: There was no pericardial effusion.   Impressions:   - Mild LVH with LVEF 85-88%, grade 1 diastolic dysfunction with    increased filling pressures. Mildly sclerotic aortic valve.    Trivial mitral and tricuspid regurgitation. Upper normal PASP 32    mm mercury.    NST: 07/2015 There was no ST segment deviation noted during stress. No T wave inversion was noted during stress. The study is normal. This is a low risk study. The left ventricular ejection fraction is hyperdynamic (>65%).   Assessment and Plan:   1. Essential hypertension Blood pressure today 132/70.  In the interim since last visit she saw her PCP who placed her back on losartan and stopped her lisinopril.  Continue losartan 50 mg p.o. daily.  2. Mixed hyperlipidemia Continue Zetia 10 mg daily.  3. Dyspnea on exertion Currently no complaints of significant dyspnea.  She is not very active on a daily basis.  She has a history of rheumatic heart disease. Echocardiogram 03/08/2020 showed EF of 60 to 65%.  Mild LVH, G1 DD, elevated left atrial pressure.  Trivial MR.  No significant change since previous echo in 2015.    Medication Adjustments/Labs and Tests Ordered: Current medicines are reviewed at length with the patient today.  Concerns regarding medicines are outlined above.   Disposition: Follow-up with Dr. Harl Bowie or [facility] 6 months  Signed, Levell July, NP 09/11/2020 11:23 AM    [facility] [place], [place], [place] [address] Phone: [phone]; Fax: [phone]

## 2020-09-11 ENCOUNTER — Ambulatory Visit (INDEPENDENT_AMBULATORY_CARE_PROVIDER_SITE_OTHER): Payer: Medicare Other | Admitting: Family Medicine

## 2020-09-11 ENCOUNTER — Encounter: Payer: Self-pay | Admitting: Family Medicine

## 2020-09-11 ENCOUNTER — Other Ambulatory Visit: Payer: Self-pay

## 2020-09-11 VITALS — BP 132/70 | HR 80 | Ht 64.0 in | Wt 142.0 lb

## 2020-09-11 DIAGNOSIS — R0609 Other forms of dyspnea: Secondary | ICD-10-CM

## 2020-09-11 DIAGNOSIS — R0600 Dyspnea, unspecified: Secondary | ICD-10-CM | POA: Diagnosis not present

## 2020-09-11 DIAGNOSIS — I10 Essential (primary) hypertension: Secondary | ICD-10-CM

## 2020-09-11 DIAGNOSIS — E782 Mixed hyperlipidemia: Secondary | ICD-10-CM | POA: Diagnosis not present

## 2020-09-11 DIAGNOSIS — I471 Supraventricular tachycardia: Secondary | ICD-10-CM

## 2020-09-11 MED ORDER — LOSARTAN POTASSIUM 50 MG PO TABS: 50.0000 mg | ORAL_TABLET | Freq: Every day | ORAL | 3 refills | Status: DC

## 2020-09-11 NOTE — Patient Instructions (Signed)
Medication Instructions:  Continue all current medications.   Labwork: none  Testing/Procedures: none  Follow-Up: 6 months   Any Other Special Instructions Will Be Listed Below (If Applicable).   If you need a refill on your cardiac medications before your next appointment, please call your pharmacy.  

## 2020-09-12 DIAGNOSIS — R490 Dysphonia: Secondary | ICD-10-CM | POA: Diagnosis not present

## 2020-09-12 DIAGNOSIS — R0982 Postnasal drip: Secondary | ICD-10-CM | POA: Diagnosis not present

## 2020-09-12 DIAGNOSIS — H6121 Impacted cerumen, right ear: Secondary | ICD-10-CM | POA: Diagnosis not present

## 2020-09-13 ENCOUNTER — Other Ambulatory Visit: Payer: Self-pay | Admitting: Family Medicine

## 2020-09-13 DIAGNOSIS — E1100 Type 2 diabetes mellitus with hyperosmolarity without nonketotic hyperglycemic-hyperosmolar coma (NKHHC): Secondary | ICD-10-CM

## 2020-09-19 ENCOUNTER — Ambulatory Visit (INDEPENDENT_AMBULATORY_CARE_PROVIDER_SITE_OTHER): Payer: Medicare Other

## 2020-09-19 ENCOUNTER — Other Ambulatory Visit: Payer: Self-pay

## 2020-09-19 DIAGNOSIS — N3010 Interstitial cystitis (chronic) without hematuria: Secondary | ICD-10-CM | POA: Diagnosis not present

## 2020-09-19 LAB — URINALYSIS, ROUTINE W REFLEX MICROSCOPIC
Bilirubin, UA: NEGATIVE
Glucose, UA: NEGATIVE
Ketones, UA: NEGATIVE
Nitrite, UA: NEGATIVE
Specific Gravity, UA: 1.02 (ref 1.005–1.030)
Urobilinogen, Ur: 0.2 mg/dL (ref 0.2–1.0)
pH, UA: 6.5 (ref 5.0–7.5)

## 2020-09-19 LAB — MICROSCOPIC EXAMINATION
Bacteria, UA: NONE SEEN
Renal Epithel, UA: NONE SEEN /hpf

## 2020-09-19 MED ORDER — DIMETHYL SULFOXIDE 50 % IS SOLN
50.0000 mL | Freq: Once | INTRAVESICAL | Status: AC
  Administered 2020-09-19: 50 mL via URETHRAL

## 2020-09-19 MED ORDER — LIDOCAINE HCL 1 % IJ SOLN
20.0000 mL | Freq: Once | INTRAMUSCULAR | Status: AC
  Administered 2020-09-19: 20 mL

## 2020-09-19 MED ORDER — GEMTESA 75 MG PO TABS
1.0000 | ORAL_TABLET | Freq: Every day | ORAL | 11 refills | Status: DC
Start: 2020-09-19 — End: 2020-10-25

## 2020-09-19 MED ORDER — SODIUM BICARBONATE 8.4 % IV SOLN
50.0000 meq | Freq: Once | INTRAVENOUS | Status: AC
  Administered 2020-09-19: 50 meq via INTRAVENOUS

## 2020-09-19 MED ORDER — HYDROCORTISONE NA SUCCINATE PF 100 MG IJ SOLR
100.0000 mg | Freq: Once | INTRAMUSCULAR | Status: AC
  Administered 2020-09-19: 100 mg

## 2020-09-19 NOTE — Progress Notes (Signed)
Bladder Instillation  Due to IC patient is present today for a Bladder Instillation of DMSO. Patient was cleaned and prepped in a sterile fashion with betadine and lidocaine 2% jelly was instilled into the urethra.  A 20FR catheter was inserted, urine return was noted 182m, urine was yellow in color.  DMSO was instilled into the bladder. Patient held the medication in the bladder for 20 minutes. The catheter was then removed. Patient tolerated well, no complications were noted   Performed by: Tamre Cass LPN  Follow up/ Additional notes: Keep next scheduled [place]

## 2020-09-20 ENCOUNTER — Encounter: Payer: Self-pay | Admitting: Gastroenterology

## 2020-09-20 ENCOUNTER — Ambulatory Visit (INDEPENDENT_AMBULATORY_CARE_PROVIDER_SITE_OTHER): Payer: Medicare Other | Admitting: Gastroenterology

## 2020-09-20 VITALS — BP 130/68 | HR 75 | Temp 97.8°F | Ht 64.0 in | Wt 139.8 lb

## 2020-09-20 DIAGNOSIS — R140 Abdominal distension (gaseous): Secondary | ICD-10-CM

## 2020-09-20 DIAGNOSIS — M9901 Segmental and somatic dysfunction of cervical region: Secondary | ICD-10-CM | POA: Diagnosis not present

## 2020-09-20 DIAGNOSIS — K649 Unspecified hemorrhoids: Secondary | ICD-10-CM

## 2020-09-20 DIAGNOSIS — M9902 Segmental and somatic dysfunction of thoracic region: Secondary | ICD-10-CM | POA: Diagnosis not present

## 2020-09-20 DIAGNOSIS — K580 Irritable bowel syndrome with diarrhea: Secondary | ICD-10-CM | POA: Diagnosis not present

## 2020-09-20 DIAGNOSIS — M9903 Segmental and somatic dysfunction of lumbar region: Secondary | ICD-10-CM | POA: Diagnosis not present

## 2020-09-20 DIAGNOSIS — K861 Other chronic pancreatitis: Secondary | ICD-10-CM

## 2020-09-20 DIAGNOSIS — K224 Dyskinesia of esophagus: Secondary | ICD-10-CM | POA: Diagnosis not present

## 2020-09-20 DIAGNOSIS — K219 Gastro-esophageal reflux disease without esophagitis: Secondary | ICD-10-CM

## 2020-09-20 DIAGNOSIS — S134XXA Sprain of ligaments of cervical spine, initial encounter: Secondary | ICD-10-CM | POA: Diagnosis not present

## 2020-09-20 MED ORDER — HYOSCYAMINE SULFATE 0.125 MG SL SUBL: SUBLINGUAL_TABLET | SUBLINGUAL | 3 refills | Status: DC

## 2020-09-20 MED ORDER — CILIDINIUM-CHLORDIAZEPOXIDE 2.5-5 MG PO CAPS: 1.0000 | ORAL_CAPSULE | Freq: Four times a day (QID) | ORAL | 3 refills | Status: DC | PRN

## 2020-09-20 NOTE — Patient Instructions (Addendum)
Continue omeprazole 20 mg daily as needed for acid reflux. Continue Creon 3 capsules with meals and 2 capsules with snacks. Continue Levsin 1 tablet under the tongue up to 4 times daily as needed for abdominal cramping or diarrhea. Continue Librax up to 4 times daily as needed for esophageal spasms. Return to the office for hemorrhoid banding with Dr. Gala Romney. Return to the office in 6 months for routine follow-up.  Call sooner if you are having any problems.   Low-FODMAP Eating Plan  FODMAP stands for fermentable oligosaccharides, disaccharides, monosaccharides, and polyols. These are sugars that are hard for some people to digest. A low-FODMAP eating plan may help some people who have irritable bowel syndrome (IBS) and certain other bowel (intestinal) diseases to manage their symptoms. This meal plan can be complicated to follow. Work with a diet and nutrition specialist (dietitian) to make a low-FODMAP eating plan that is right for you. A dietitian can helpmake sure that you get enough nutrition from this diet. What are tips for following this plan? Reading food labels Check labels for hidden FODMAPs such as: High-fructose syrup. Honey. Agave. Natural fruit flavors. Onion or garlic powder. Choose low-FODMAP foods that contain 3-4 grams of fiber per serving. Check food labels for serving sizes. Eat only one serving at a time to make sure FODMAP levels stay low. Shopping Shop with a list of foods that are recommended on this diet and make a meal plan. Meal planning Follow a low-FODMAP eating plan for up to 6 weeks, or as told by your health care provider or dietitian. To follow the eating plan: Eliminate high-FODMAP foods from your diet completely. Choose only low-FODMAP foods to eat. You will do this for 2-6 weeks. Gradually reintroduce high-FODMAP foods into your diet one at a time. Most people should wait a few days before introducing the next new high-FODMAP food into their meal plan.  Your dietitian can recommend how quickly you may reintroduce foods. Keep a daily record of what and how much you eat and drink. Make note of any symptoms that you have after eating. Review your daily record with a dietitian regularly to identify which foods you can eat and which foods you should avoid. General tips Drink enough fluid each day to keep your urine pale yellow. Avoid processed foods. These often have added sugar and may be high in FODMAPs. Avoid most dairy products, whole grains, and sweeteners. Work with a dietitian to make sure you get enough fiber in your diet. Avoid high FODMAP foods at meals to manage symptoms.  Recommended foods Fruits Bananas, oranges, tangerines, lemons, limes, blueberries, raspberries, strawberries, grapes, cantaloupe, honeydew melon, kiwi, papaya, passion fruit, and pineapple. Limited amounts of dried cranberries, banana chips, and shreddedcoconut. Vegetables Eggplant, zucchini, cucumber, peppers, green beans, bean sprouts, lettuce, arugula, kale, Swiss chard, spinach, collard greens, bok choy, summer squash, potato, and tomato. Limited amounts of corn, carrot, and sweet potato. Greenparts of scallions. Grains Gluten-free grains, such as rice, oats, buckwheat, quinoa, corn, polenta, andmillet. Gluten-free pasta, bread, or cereal. Rice noodles. Corn tortillas. Meats and other proteins Unseasoned beef, pork, poultry, or fish. Eggs. Berniece Salines. Tofu (firm) and tempeh. Limited amounts of nuts and seeds, such as almonds, walnuts, [place] nuts,pecans, peanuts, nut butters, pumpkin seeds, chia seeds, and sunflower seeds. Dairy Lactose-free milk, yogurt, and kefir. Lactose-free cottage cheese and ice cream. Non-dairy milks, such as almond, coconut, hemp, and rice milk. Non-dairy yogurt. Limited amounts of goat cheese, brie, mozzarella, parmesan, swiss, andother hard cheeses. Fats and  oils Butter-free spreads. Vegetable oils, such as olive, canola, and sunflower  oil. Seasoning and other foods Artificial sweeteners with names that do not end in "ol," such as aspartame, saccharine, and stevia. Maple syrup, white table sugar, raw sugar, brown sugar, and molasses. Mayonnaise, soy sauce, and tamari. Fresh basil, coriander,parsley, rosemary, and thyme. Beverages Water and mineral water. Sugar-sweetened soft drinks. Small amounts of orangejuice or cranberry juice. Black and green tea. Most dry wines. Coffee. The items listed above may not be a complete list of foods and beverages you can eat. Contact a dietitian for more information.   Foods to avoid Fruits Fresh, dried, and juiced forms of apple, pear, watermelon, peach, plum, cherries, apricots, blackberries, boysenberries, figs, nectarines, and mango.Avocado. Vegetables Chicory root, artichoke, asparagus, cabbage, snow peas, Brussels sprouts, broccoli, sugar snap peas, mushrooms, celery, and cauliflower. Onions, garlic,leeks, and the white part of scallions. Grains Wheat, including kamut, durum, and semolina. Barley and bulgur. Couscous.Wheat-based cereals. Wheat noodles, bread, crackers, and pastries. Meats and other proteins Fried or fatty meat. Sausage. Cashews and pistachios. Soybeans, baked beans, black beans, chickpeas, kidney beans, fava beans, navy beans, lentils,black-eyed peas, and split peas. Dairy Milk, yogurt, ice cream, and soft cheese. Cream and sour cream. Milk-basedsauces. Custard. Buttermilk. Soy milk. Seasoning and other foods Any sugar-free gum or candy. Foods that contain artificial sweeteners such as sorbitol, mannitol, isomalt, or xylitol. Foods that contain honey, high-fructose corn syrup, or agave. Bouillon, vegetable stock, beef stock, and chicken stock. Garlic and onion powder. Condiments made with onion, such ashummus, chutney, pickles, relish, salad dressing, and salsa. Tomato paste. Beverages Chicory-based drinks. Coffee substitutes. Chamomile tea. Fennel tea. Sweet or  fortified wines such as port or sherry. Diet soft drinks made with isomalt, mannitol, maltitol, sorbitol, or xylitol. Apple, pear, and mango juice. Juiceswith high-fructose corn syrup. The items listed above may not be a complete list of foods and beverages you should avoid. Contact a dietitian for more information. Summary FODMAP stands for fermentable oligosaccharides, disaccharides, monosaccharides, and polyols. These are sugars that are hard for some people to digest. A low-FODMAP eating plan is a short-term diet that helps to ease symptoms of certain bowel diseases. The eating plan usually lasts up to 6 weeks. After that, high-FODMAP foods are reintroduced gradually and one at a time. This can help you find out which foods may be causing symptoms. A low-FODMAP eating plan can be complicated. It is best to work with a dietitian who has experience with this type of plan. This information is not intended to replace advice given to you by your health care provider. Make sure you discuss any questions you have with your healthcare provider. Document Revised: 06/24/2019 Document Reviewed: 06/24/2019 Elsevier Patient Education  [place].

## 2020-09-20 NOTE — Progress Notes (Signed)
Primary Care Physician: Erven Colla, DO  Primary Gastroenterologist:  Garfield Cornea, MD   Chief Complaint  Patient presents with   Gastroesophageal Reflux    Hemorrhoids, esophagus spasms    HPI: Deborah Gray is a 87 y.o. female here for follow-up.  Last seen in June 2021.  She has a history of GERD, intermittent diarrhea, bloating, hemorrhoids.  Also with history of pancreatic exocrine insufficiency diarrhea. Colonoscopy 03/2016 with diverticulosis. She has a history of adenomatous colon polyps. Also with nutcracker esophagus, remote esophageal strictures, esophageal spasms improved on imipramine, idiopathic chronic pancreatitis, IBS.  EGD March 2017 with diffuse moderate inflammation characterized by edema, erythema, linear erosions in the entire examined stomach, biopsy showed reactive gastropathy. In May 2021 she underwent hemorrhoid banding of the right hemorrhoid column and left mid column.  She underwent banding in the right mid and anterior hemorrhoid column back in March 2021.   Patient was having increasing GI symptoms several months back but had to wait to be seen. Things finally settled back down some.  For the most part has daily bowel movements.  Stools range from [place] 1 to [place] 7.  Occasional constipation.  She takes Creon with meals and snacks.  If she has abdominal cramping and/or loose stools she will take Levsin.  States that she rarely has to use.  Sometimes she will take Levsin if she has a doctor's appointment.  Complains of bloating, gas, abdominal soreness.  Cannot wear closed too tight.  No heartburn.  Advised to chew her food thoroughly to prevent ingesting too much air because of her bloating.  She does drink from straws.  She also consumes tea with meals especially if eating out.  Tries to stick to decaf tea at home.  Avoids carbonated beverages.  Bloating seems to be worse with broccoli, small, apples.  Deborah Chard' colon health 1 at bedtime.   Has occasional flare of esophageal spasms, may have to take Librax 2 to 3 days in a row until it settles down but rarely uses.  Would like me to reduce her Librax and Levsin to no more than 90 tablets at a time because she cannot use them before they expire.  Continues to have intermittent hemorrhoid flare with bright red blood per rectum.  Would like to be evaluated by Dr. Gala Romney for additional banding.  Weight has been stable.     Current Outpatient Medications  Medication Sig Dispense Refill   acetaminophen (TYLENOL) 500 MG tablet Take 500 mg by mouth at bedtime.     aspirin EC 81 MG tablet Take 81 mg by mouth at bedtime.     blood glucose meter kit and supplies KIT Dispense based on  insurance preference. Tests once a day E11.9 1 each 0   Carboxymethylcellulose Sodium (THERATEARS) 0.25 % SOLN Apply 1 drop to eye 2 (two) times daily.      clidinium-chlordiazePOXIDE (LIBRAX) 5-2.5 MG capsule Take 1 capsule by mouth 4 (four) times daily as needed. (Patient taking differently: Take 1 capsule by mouth as needed.) 120 capsule 3   clobetasol (TEMOVATE) 0.05 % external solution SMARTSIG:6-8 Drop(s) Topical Daily     CREON 24000-76000 units CPEP TAKE 3 CAPSULES THREE TIMES DAILY WITH MEALS AND ONE CAPSULE WITH SNACK TWICE DAILY 990 capsule 2   ezetimibe (ZETIA) 10 MG tablet TAKE 1 TABLET BY MOUTH EVERY DAY 90 tablet 3   guaiFENesin (MUCINEX) 600 MG 12 hr tablet Take 1,200 mg by mouth daily.  hyoscyamine (LEVSIN SL) 0.125 MG SL tablet USE 1 TABLET UNDER THE TONGUE BEFORE MEALS AND AT BEDTIME AS NEEDED FOR FLARES IN SYMPTOMS (Patient taking differently: as needed. USE 1 TABLET UNDER THE TONGUE BEFORE MEALS AND AT BEDTIME AS NEEDED FOR FLARES IN SYMPTOMS) 90 tablet 1   losartan (COZAAR) 50 MG tablet Take 1 tablet (50 mg total) by mouth daily. 90 tablet 3   meclizine (ANTIVERT) 25 MG tablet TAKE 1 TABLET BY MOUTH 3 TIMES A DAY AS NEEDED (Patient taking differently: as needed.) 30 tablet 1   metFORMIN  (GLUCOPHAGE) 500 MG tablet TAKE ONE TABLET BY MOUTH EVERY MORNING ONE AT LUNCH AND 2 AT BEDTIME (Patient taking differently: TAKE ONE TABLET BY MOUTH EVERY MORNING AND 2 Tablets AT BEDTIME) 360 tablet 0   methenamine (HIPREX) 1 g tablet Take 1 tablet (1 g total) by mouth 2 (two) times daily. 60 tablet 11   Multiple Vitamins-Minerals (PRESERVISION AREDS 2) CAPS Take 1 capsule by mouth 2 (two) times daily.      nateglinide (STARLIX) 120 MG tablet TAKE 1 TABLET BY MOUTH 3 TIMES A DAY BEFORE MEALS 270 tablet 0   NON FORMULARY [facility] probiotic  Once a day     omega-3 acid ethyl esters (LOVAZA) 1 g capsule Take 2 g by mouth daily.     omeprazole (PRILOSEC) 20 MG capsule TAKE 1 CAPSULE BY MOUTH EVERY DAY (Patient taking differently: as needed.) 90 capsule 3   OVER THE COUNTER MEDICATION 2 (two) times daily. Vitamin D     One a day  Not sure strength     Vibegron (GEMTESA) 75 MG TABS Take 1 capsule by mouth daily. 30 tablet 11   Vitamin Mixture (ESTER-C) 500-60 MG TABS Take 1 tablet by mouth daily.      cetirizine (ZYRTEC) 10 MG tablet Take 10 mg by mouth daily. (Patient not taking: Reported on 09/20/2020)     No current facility-administered medications for this visit.    Allergies as of 09/20/2020 - Review Complete 09/11/2020  Allergen Reaction Noted   Adhesive [tape] Other (See Comments) 07/21/2016   Estrogens Other (See Comments) 10/15/2012   Sulfonamide derivatives Rash 03/01/2008    ROS:  General: Negative for anorexia, weight loss, fever, chills, fatigue, weakness. ENT: Negative for hoarseness, difficulty swallowing , nasal congestion. CV: Negative for chest pain, angina, palpitations, dyspnea on exertion, peripheral edema.  Respiratory: Negative for dyspnea at rest, dyspnea on exertion, cough, sputum, wheezing.  GI: See history of present illness. GU:  Negative for dysuria, hematuria, urinary incontinence, urinary frequency, nocturnal urination.  Endo: Negative for unusual weight  change.    Physical Examination:   BP 130/68   Pulse 75   Temp 97.8 F (36.6 C) (Temporal)   Ht _0  (1.626 m)   Wt 139 lb 12.8 oz (63.4 kg)   BMI 24.00 kg/m   General: Well-nourished, well-developed in no acute distress.  Eyes: No icterus. Mouth: masked Lungs: Clear to auscultation bilaterally.  Heart: Regular rate and rhythm, no murmurs rubs or gallops.  Abdomen: Bowel sounds are normal, nontender, nondistended, no hepatosplenomegaly or masses, no abdominal bruits or hernia , no rebound or guarding.   Extremities: No lower extremity edema. No clubbing or deformities. Neuro: Alert and oriented x 4   Skin: Warm and dry, no jaundice.   Psych: Alert and cooperative, normal mood and affect.  Labs:  Lab Results  Component Value Date   CREATININE 0.65 07/12/2020   BUN 20 07/12/2020  NA 143 07/12/2020   K 5.0 07/12/2020   CL 105 07/12/2020   CO2 24 07/12/2020   Lab Results  Component Value Date   ALT 15 07/03/2020   AST 13 07/03/2020   ALKPHOS 85 07/03/2020   BILITOT 0.4 07/03/2020   Lab Results  Component Value Date   WBC 7.3 07/03/2020   HGB 14.3 07/03/2020   HCT 43.2 07/03/2020   MCV 89 07/03/2020   PLT 247 07/03/2020     Imaging Studies: No results found.   Assessment:  87 year old lady with multiple GI issues including history of nutcracker esophagus, idiopathic chronic pancreatitis with chronic pancreatic exocrine insufficiency, IBS, hemorrhoids, GERD presenting for follow-up.  Chronic intermittent diarrhea likely multifactorial from IBS and/or idiopathic chronic pancreatitis/pancreatic exocrine insufficiency.  Cannot exclude some medication effect as she is on metformin.  She takes Creon on a regular basis.  She rarely needs Levsin for postprandial abdominal cramping or diarrhea.  Works well when she takes.  Sometimes will utilize Imodium.  Overall symptoms seem to be stable.  She has some bloating which may be related to her diet.  She was provided with  FODMAP diet, foods to avoid.  Would also like her to back off on tea to see if this is contributing to her bloating.  Nutcracker esophagus/esophageal spasms reasonly well controlled.  Takes Librax for flares.  Patient states symptoms will resolve in 5 minutes of taking Librax.  Continues omeprazole as needed for heartburn.  Does not take on a regular basis.  Will need prior authorization completed again for Librax as stated in the letter that she received and brought in today.  Hemorrhoids: Previously banded by Dr. Gala Romney.  She would like to be reevaluated for additional banding.  We will bring her back on hemorrhoid banding scheduled.   Plan: Continue omeprazole 20 mg daily as needed for reflux. Continue Creon 3 capsules with meals and 2 with snacks. Continue Levsin up to 4 times daily as needed for abdominal cramping and diarrhea. Continue Librax up to 4 times daily as needed for esophageal spasms.  Patient rarely has to use Levsin or Librax but is aware not to use at the same time.  Se has been on this regimen for years and is worked the best for her. Return to the office for hemorrhoid banding with Dr. Gala Romney. Avoid foods on FODMAP eating plan as they may contribute to bloating.  Stop tea for few weeks to see if this helps with her bloating. Return to the office in 6 months for routine follow-up.  Call sooner if any problems.

## 2020-09-25 ENCOUNTER — Telehealth: Payer: Self-pay | Admitting: *Deleted

## 2020-09-25 NOTE — Telephone Encounter (Signed)
Pt called in. She states she is not able to have banding with Vicente Males done on 8/23 d/t another appt she has. She needs this rescheduled.

## 2020-09-29 ENCOUNTER — Telehealth: Payer: Self-pay | Admitting: Gastroenterology

## 2020-09-29 NOTE — Telephone Encounter (Signed)
Dr. Gala Romney said he would be willing to complete hemorrhoid banding for patient if needed to accommodate schedule. If he can get done sooner than Deborah Gray please schedule with Dr. Gala Romney. He completed Deborah Gray previous bandings and patient stated at time of OV preferred him because he is "familiar" with Deborah Gray hemorrhoids.

## 2020-09-29 NOTE — Telephone Encounter (Signed)
Fowarding to [employer]

## 2020-10-04 DIAGNOSIS — M9903 Segmental and somatic dysfunction of lumbar region: Secondary | ICD-10-CM | POA: Diagnosis not present

## 2020-10-04 DIAGNOSIS — M9901 Segmental and somatic dysfunction of cervical region: Secondary | ICD-10-CM | POA: Diagnosis not present

## 2020-10-04 DIAGNOSIS — M9902 Segmental and somatic dysfunction of thoracic region: Secondary | ICD-10-CM | POA: Diagnosis not present

## 2020-10-04 DIAGNOSIS — S134XXA Sprain of ligaments of cervical spine, initial encounter: Secondary | ICD-10-CM | POA: Diagnosis not present

## 2020-10-10 ENCOUNTER — Ambulatory Visit: Payer: Medicare Other | Admitting: Gastroenterology

## 2020-10-10 DIAGNOSIS — H34832 Tributary (branch) retinal vein occlusion, left eye, with macular edema: Secondary | ICD-10-CM | POA: Diagnosis not present

## 2020-10-18 DIAGNOSIS — M9901 Segmental and somatic dysfunction of cervical region: Secondary | ICD-10-CM | POA: Diagnosis not present

## 2020-10-18 DIAGNOSIS — M9903 Segmental and somatic dysfunction of lumbar region: Secondary | ICD-10-CM | POA: Diagnosis not present

## 2020-10-18 DIAGNOSIS — M9902 Segmental and somatic dysfunction of thoracic region: Secondary | ICD-10-CM | POA: Diagnosis not present

## 2020-10-18 DIAGNOSIS — S134XXA Sprain of ligaments of cervical spine, initial encounter: Secondary | ICD-10-CM | POA: Diagnosis not present

## 2020-10-19 ENCOUNTER — Ambulatory Visit (INDEPENDENT_AMBULATORY_CARE_PROVIDER_SITE_OTHER): Payer: Medicare Other

## 2020-10-19 ENCOUNTER — Other Ambulatory Visit: Payer: Self-pay

## 2020-10-19 ENCOUNTER — Telehealth: Payer: Self-pay | Admitting: Family Medicine

## 2020-10-19 DIAGNOSIS — N3010 Interstitial cystitis (chronic) without hematuria: Secondary | ICD-10-CM

## 2020-10-19 LAB — URINALYSIS, ROUTINE W REFLEX MICROSCOPIC
Bilirubin, UA: NEGATIVE
Glucose, UA: NEGATIVE
Ketones, UA: NEGATIVE
Leukocytes,UA: NEGATIVE
Nitrite, UA: NEGATIVE
Specific Gravity, UA: 1.02 (ref 1.005–1.030)
Urobilinogen, Ur: 0.2 mg/dL (ref 0.2–1.0)
pH, UA: 6 (ref 5.0–7.5)

## 2020-10-19 LAB — MICROSCOPIC EXAMINATION
RBC, Urine: >30 /hpf — AB (ref 0–2)
Renal Epithel, UA: NONE SEEN /hpf

## 2020-10-19 MED ORDER — SODIUM BICARBONATE 8.4 % IV SOLN: 50.0000 meq | Freq: Once | INTRAVENOUS | Status: DC

## 2020-10-19 MED ORDER — HYDROCORTISONE NA SUCCINATE PF 100 MG IJ SOLR
100.0000 mg | Freq: Once | INTRAMUSCULAR | Status: AC
  Administered 2020-10-19: 100 mg

## 2020-10-19 MED ORDER — DIMETHYL SULFOXIDE 50 % IS SOLN
50.0000 mL | Freq: Once | INTRAVESICAL | Status: AC
  Administered 2020-10-19: 50 mL via URETHRAL

## 2020-10-19 MED ORDER — SODIUM BICARBONATE 8.4 % IV SOLN
50.0000 meq | Freq: Once | INTRAVENOUS | Status: AC
  Administered 2020-10-19: 50 meq

## 2020-10-19 MED ORDER — LIDOCAINE HCL 2 % IJ SOLN
10.0000 mL | Freq: Once | INTRAMUSCULAR | Status: AC
  Administered 2020-10-19: 200 mg

## 2020-10-19 NOTE — Progress Notes (Signed)
Bladder Instillation   Due to IC patient is present today for a Bladder Instillation of DMSO. Patient was cleaned and prepped in a sterile fashion with betadine and lidocaine 2% jelly was instilled into the urethra.  A 20FR catheter was inserted, urine return was noted 119m, urine was yellow in color.  DMSO was instilled into the bladder. Patient held the medication in the bladder for 20 minutes. The catheter was then removed. Patient tolerated well, no complications were noted     Performed by: Jawann Urbani LPN   Follow up/ Additional notes: Keep next scheduled NV

## 2020-10-19 NOTE — Addendum Note (Signed)
Addended byIris Pert on: 10/19/2020 04:37 PM   Modules accepted: Orders

## 2020-10-19 NOTE — Telephone Encounter (Signed)
Left message for patient to call back and schedule Medicare Annual Wellness Visit (AWV) in office.   If unable to come into the office for AWV,  please offer to do virtually or by telephone.  Last AWV: 07/05/2019  Please schedule at anytime with [facility].  40 minute appointment  Any questions, please contact me at [phone]

## 2020-10-20 NOTE — Addendum Note (Signed)
Addended by: Iris Pert on: 10/20/2020 11:19 AM   Modules accepted: Orders

## 2020-10-21 IMAGING — DX DG CHEST 1V PORT
1 series · 1 of 1 positions shown · non-contrast
Comparison: November 07, 2017.

CLINICAL DATA: Chest pain.

EXAM:
PORTABLE CHEST 1 VIEW

[chest ap]
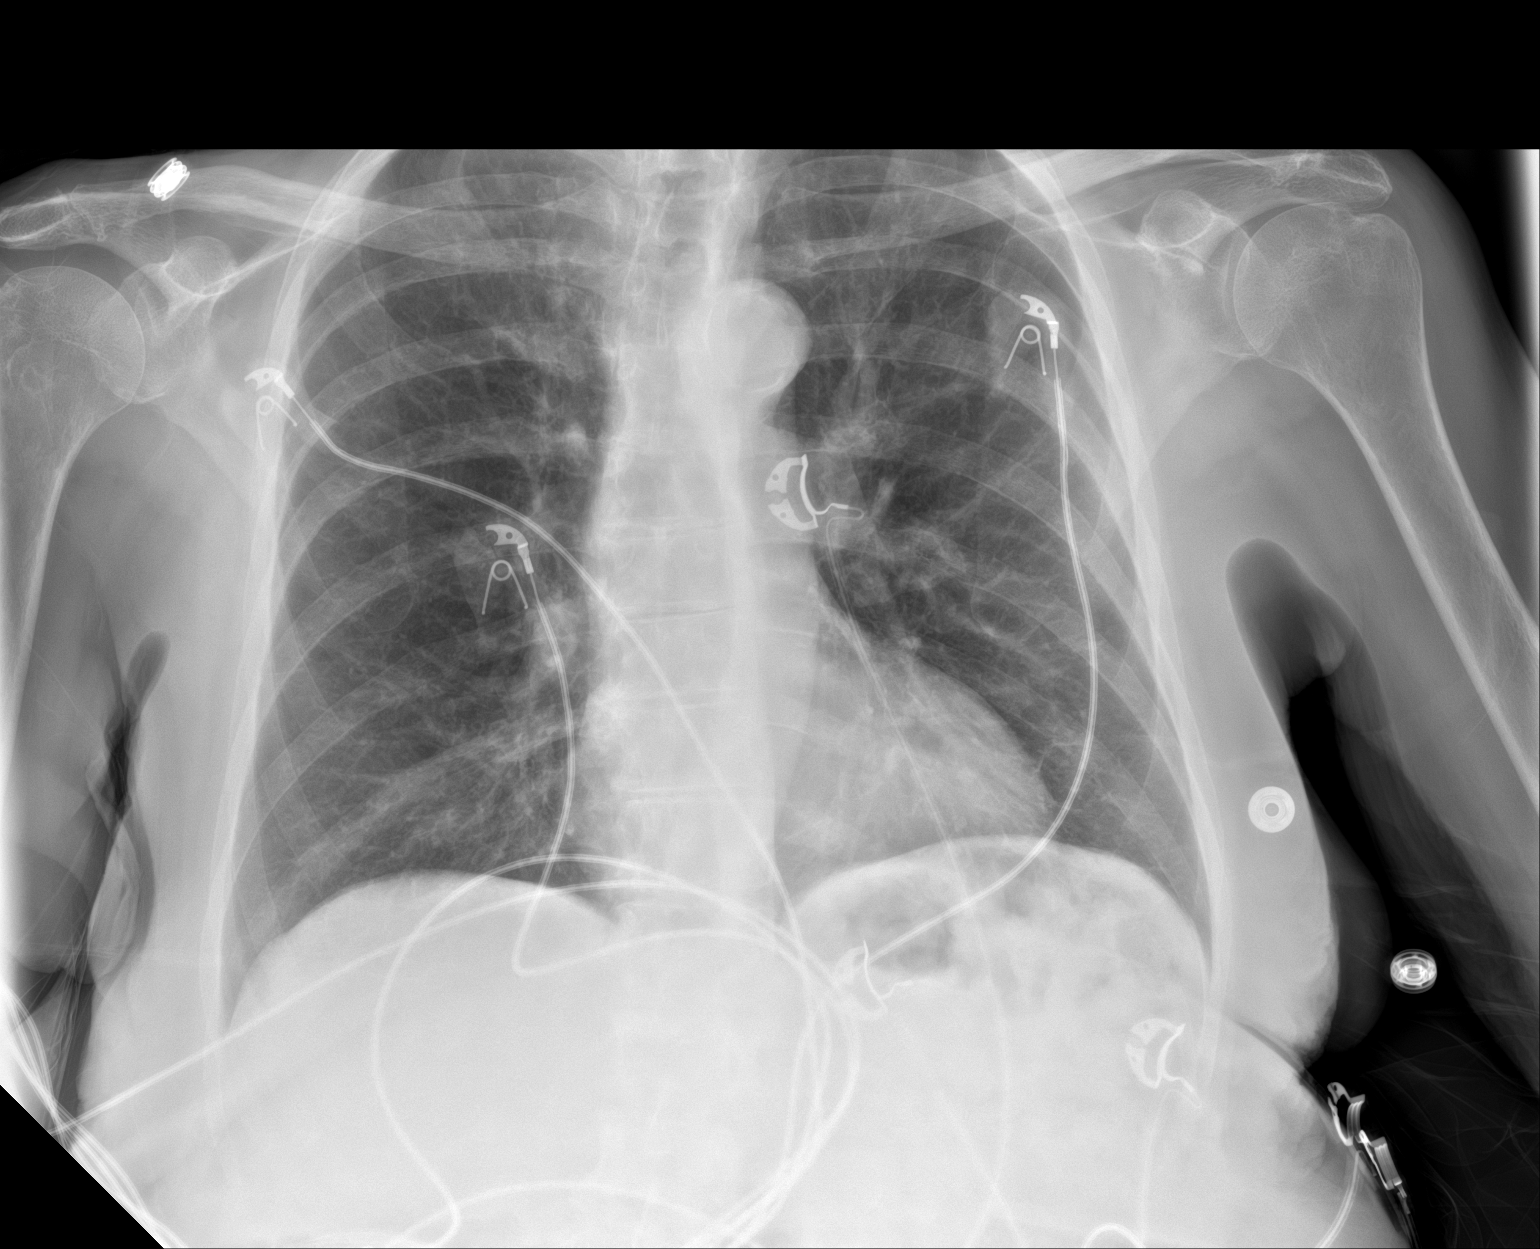

[1 of 1 positions shown; findings below may reference images not displayed]

FINDINGS: The heart size and mediastinal contours are within normal limits.
Both lungs are clear. No pneumothorax or pleural effusion is noted.
Atherosclerosis of thoracic aorta is noted. The visualized skeletal
structures are unremarkable.
IMPRESSION: No acute cardiopulmonary abnormality seen.

Aortic Atherosclerosis ([id number]).

## 2020-10-25 ENCOUNTER — Other Ambulatory Visit: Payer: Self-pay

## 2020-10-25 DIAGNOSIS — Z87440 Personal history of urinary (tract) infections: Secondary | ICD-10-CM

## 2020-10-25 DIAGNOSIS — B351 Tinea unguium: Secondary | ICD-10-CM | POA: Diagnosis not present

## 2020-10-25 DIAGNOSIS — N3010 Interstitial cystitis (chronic) without hematuria: Secondary | ICD-10-CM

## 2020-10-25 DIAGNOSIS — L84 Corns and callosities: Secondary | ICD-10-CM | POA: Diagnosis not present

## 2020-10-25 DIAGNOSIS — E1142 Type 2 diabetes mellitus with diabetic polyneuropathy: Secondary | ICD-10-CM | POA: Diagnosis not present

## 2020-10-25 MED ORDER — MIRABEGRON ER 50 MG PO TB24: 50.0000 mg | ORAL_TABLET | Freq: Every day | ORAL | 3 refills | Status: DC

## 2020-10-25 NOTE — Progress Notes (Signed)
Patient called requesting to switch back to myrbetriq  due to gemtesa cost.

## 2020-10-27 ENCOUNTER — Telehealth: Payer: Self-pay | Admitting: Internal Medicine

## 2020-10-27 DIAGNOSIS — R197 Diarrhea, unspecified: Secondary | ICD-10-CM

## 2020-10-27 MED ORDER — KETOCONAZOLE 2 % EX CREA: 1.0000 "application " | TOPICAL_CREAM | Freq: Every day | CUTANEOUS | 0 refills | Status: DC | PRN

## 2020-10-27 NOTE — Telephone Encounter (Signed)
Pt called stating that she has had diarrhea off and on for a couple weeks now. Pt states that her bottom is now irritated due to this and is wanting to know if something can be called in for her irritated bottom and for the diarrhea. Pt states that she has been taking lomotil and that its not really working for the diarrhea.   [facility]

## 2020-10-27 NOTE — Telephone Encounter (Signed)
Thanks for update.   I would like for her to take levsin twice daily for her diarrhea. Hold off on imodium for now. If she has formed stool or constipation, she can hold levsin.  I will send in something for irritated perianal area.  Due to antibiotics in July, we need to check her for Cdiff. Orders placed. She will need to have done at [facility].

## 2020-10-27 NOTE — Telephone Encounter (Signed)
Please let pt know I will get something sent in after seeing morning patients.   Please verify what she has tried for her diarrhea: Has she tried her levsin up to four times daily? I did not have her on lomotil so I'm wondering if someone else gave her that or does she mean imodium? Any recent antibiotics, in the last 1-2 months? Anyone around her sick with diarrhea?

## 2020-10-27 NOTE — Telephone Encounter (Signed)
Spoke with pt and she states that she was pronouncing it wrong that it is imodium that she is using. Pt has not been taking the levsin up to 4 times a day. Pt has not taken the levsin but will take it a lunch when she eats. Last time she took it was on Monday. Pt was on antibiotic back in July for cellulitis. Has not been around anyone that she knows of that was sick with diarrhea.

## 2020-10-27 NOTE — Addendum Note (Signed)
Addended by: Mahala Menghini on: 10/27/2020 12:10 PM   Modules accepted: Orders

## 2020-10-27 NOTE — Telephone Encounter (Signed)
Left instructions on the pt's machine per pt's request. Informed pt of medication being called in and instructions on taking Levsin twice daily until diarrhea clears and to go by [facility] to pick up stool kit to test pt's stool. Pt instructed me to leave a message on her vm due to her having a dr's appt this afternoon.

## 2020-10-27 NOTE — Addendum Note (Signed)
Addended by: Mahala Menghini on: 10/27/2020 11:31 AM   Modules accepted: Orders

## 2020-10-30 NOTE — Telephone Encounter (Signed)
Spoke with pt to verify that she picked up medication, stool kit and status of diarrhea. Pt states that the diarrhea is better and that she did in fact pick up the medication. Pt is going this morning to pick up stool kit.

## 2020-10-30 NOTE — Telephone Encounter (Signed)
Noted  

## 2020-11-01 DIAGNOSIS — M9902 Segmental and somatic dysfunction of thoracic region: Secondary | ICD-10-CM | POA: Diagnosis not present

## 2020-11-01 DIAGNOSIS — S134XXA Sprain of ligaments of cervical spine, initial encounter: Secondary | ICD-10-CM | POA: Diagnosis not present

## 2020-11-01 DIAGNOSIS — M9903 Segmental and somatic dysfunction of lumbar region: Secondary | ICD-10-CM | POA: Diagnosis not present

## 2020-11-01 DIAGNOSIS — M9901 Segmental and somatic dysfunction of cervical region: Secondary | ICD-10-CM | POA: Diagnosis not present

## 2020-11-01 LAB — C. DIFFICILE GDH AND TOXIN A/B

## 2020-11-01 NOTE — Telephone Encounter (Signed)
Ok. Please let pt know. If she has recurrent/persistent diarrhea she will need to recollect.

## 2020-11-02 ENCOUNTER — Encounter: Payer: Self-pay | Admitting: *Deleted

## 2020-11-02 ENCOUNTER — Ambulatory Visit
Admission: EM | Admit: 2020-11-02 | Discharge: 2020-11-02 | Disposition: A | Discharge status: Home / Self Care | Payer: Medicare Other | Attending: Emergency Medicine | Admitting: Emergency Medicine

## 2020-11-02 ENCOUNTER — Other Ambulatory Visit: Payer: Self-pay

## 2020-11-02 DIAGNOSIS — J069 Acute upper respiratory infection, unspecified: Secondary | ICD-10-CM

## 2020-11-02 DIAGNOSIS — R0981 Nasal congestion: Secondary | ICD-10-CM

## 2020-11-02 DIAGNOSIS — L03314 Cellulitis of groin: Secondary | ICD-10-CM

## 2020-11-02 MED ORDER — DOXYCYCLINE HYCLATE 100 MG PO CAPS: 100.0000 mg | ORAL_CAPSULE | Freq: Two times a day (BID) | ORAL | 0 refills | Status: DC

## 2020-11-02 NOTE — ED Triage Notes (Signed)
Pt reports a rash in groin and Pt also has congestion and has felt chills.

## 2020-11-02 NOTE — Discharge Instructions (Signed)
Rash:  Prescribed doxycycline take as directed and to completion Continue to alternate ibuprofen and tylenol as needed for pain and fever Follow up with dermatologist for recheck next week Return or go to the ED if you have any new or worsening symptoms such as increased pain, redness, swelling, discharge, high fever, night sweats, abdominal pain, etc...   URI symptoms:  COVID testing ordered.  It will take between 2-5 days for test results.  Someone will contact you regarding abnormal results.    In the meantime: You should remain isolated in your home for 5 days from symptom onset AND greater than 72 hours after symptoms resolution (absence of fever without the use of fever-reducing medication and improvement in respiratory symptoms), whichever is longer Get plenty of rest and push fluids Use OTC zyrtec for nasal congestion, runny nose, and/or sore throat Use OTC flonase for nasal congestion and runny nose Use medications daily for symptom relief Use OTC medications like ibuprofen or tylenol as needed fever or pain Call or go to the ED if you have any new or worsening symptoms such as fever, worsening cough, shortness of breath, chest tightness, chest pain, turning blue, changes in mental status, etc..Marland Kitchen

## 2020-11-02 NOTE — ED Provider Notes (Signed)
[place]   [id number] 11/02/20 Arrival Time: 1024   CC: rash; COVID symptoms  SUBJECTIVE: History from: patient.  Deborah Gray is a 87 y.o. female who presents for rash to LT groin x few weeks.  Denies precipitating event or trauma.  Has been using OTC cream without relief.  Sore to the touch.  Concerned for cellulitis.  Unable to be seen by PCP or dermatologist.Complains of redness.  Denies itching, drainage or bleeding.     Also complains of with congestion with purulent drainage and cough x 1 day.  Denies sick exposure to COVID, flu or strep.  Worse at night.  Reports previous symptoms in the past.   Denies fever, chills, SOB, wheezing, chest pain, nausea, changes in bowel or bladder habits.    ROS: As per HPI.  All other pertinent ROS negative.     Past Medical History:  Diagnosis Date   Arthritis    Benign neoplasm of colon    Constipation    Diaphragmatic hernia without mention of obstruction or gangrene    Diverticulosis of colon (without mention of hemorrhage)    Dysphagia, pharyngoesophageal phase    Esophageal reflux    Essential hypertension    Heart murmur    Hemorrhoids    Hyperlipidemia    Internal hemorrhoids without mention of complication    Left wrist fracture    PONV (postoperative nausea and vomiting)    Stricture and stenosis of esophagus    Type 2 diabetes mellitus (HCC)    Unspecified gastritis and gastroduodenitis without mention of hemorrhage    Past Surgical History:  Procedure Laterality Date   ABDOMINAL HYSTERECTOMY     BACTERIAL OVERGROWTH TEST N/A 09/06/2015   Procedure: BACTERIAL OVERGROWTH TEST;  Surgeon: Daneil Dolin, MD;  Location: [facility];  Service: Endoscopy;  Laterality: N/A;  800   BREAST LUMPECTOMY Right    benign   CATARACT EXTRACTION Bilateral 2006   Implants in both, surgeries done 6 weeks apart   CHOLECYSTECTOMY     COLONOSCOPY  03/08/02   Patterson: diverticulosis, internal hemorrhoids, adenomatous  colon polyp   COLONOSCOPY  01/23/04   [name]: diverticulosis, internal hemorrhoids   COLONOSCOPY  09/25/05   [name]: diverticulosis   COLONOSCOPY  07/05/09   [name]: severe diverticulosis   COLONOSCOPY  01/21/11   [name]: severe diverticulosis in sigmoid to desc colon, int hemorrhoids, follow up TCS in 5 years   COLONOSCOPY N/A 04/10/2016   [name]: diverticulosis   DILATION AND CURETTAGE OF UTERUS     ESOPHAGEAL MANOMETRY  03/21/08   [name]: findings c/w Nutcracker Esophagus   ESOPHAGOGASTRODUODENOSCOPY  03/08/02   [name]: esophageal stricture, chronic gerd, s/p dilation.    ESOPHAGOGASTRODUODENOSCOPY  01/23/04   [name]: esophageal stricture, gastritis, hiatal hernia   ESOPHAGOGASTRODUODENOSCOPY  09/25/05   [name]: gastritis, benign bx, no H.pylori   ESOPHAGOGASTRODUODENOSCOPY  07/05/09   [name]: gastropathy, benign small bowel bx and gastric bx   ESOPHAGOGASTRODUODENOSCOPY N/A 05/15/2015   Dr. Gala Romney- diffuse moderate inflammation characterized by congestion (edema), erythema, and linear erosions was found in the entire examined stomach. bx= reactive gastropathy   FOOT SURGERY Left    HEMORRHOID BANDING  2017   Dr.[name]   LAPAROSCOPIC VAGINAL HYSTERECTOMY     ORIF WRIST FRACTURE Left 06/23/2018   Procedure: OPEN REDUCTION INTERNAL FIXATION (ORIF) LEFT WRIST FRACTURE;  Surgeon: Renette Butters, MD;  Location: [facility];  Service: Orthopedics;  Laterality: Left;  regional arm block   RECTOCELE REPAIR  TOTAL KNEE ARTHROPLASTY Right    Allergies  Allergen Reactions   Adhesive [Tape] Other (See Comments)    Reaction:  Blisters    Estrogens Other (See Comments)    Reaction:  Migraines    Sulfonamide Derivatives Rash   Current Facility-Administered Medications on File Prior to Encounter  Medication Dose Route Frequency Provider Last Rate Last Admin   sodium bicarbonate injection 50 mEq  50 mEq Intravenous Once Irine Seal, MD       Current  Outpatient Medications on File Prior to Encounter  Medication Sig Dispense Refill   acetaminophen (TYLENOL) 500 MG tablet Take 500 mg by mouth at bedtime.     aspirin EC 81 MG tablet Take 81 mg by mouth at bedtime.     blood glucose meter kit and supplies KIT Dispense based on  insurance preference. Tests once a day E11.9 1 each 0   Carboxymethylcellulose Sodium (THERATEARS) 0.25 % SOLN Apply 1 drop to eye 2 (two) times daily.      clidinium-chlordiazePOXIDE (LIBRAX) 5-2.5 MG capsule Take 1 capsule by mouth 4 (four) times daily as needed (for esophageal symptoms). 90 capsule 3   clobetasol (TEMOVATE) 0.05 % external solution SMARTSIG:6-8 Drop(s) Topical Daily     CREON 24000-76000 units CPEP TAKE 3 CAPSULES THREE TIMES DAILY WITH MEALS AND ONE CAPSULE WITH SNACK TWICE DAILY 990 capsule 2   ezetimibe (ZETIA) 10 MG tablet TAKE 1 TABLET BY MOUTH EVERY DAY 90 tablet 3   guaiFENesin (MUCINEX) 600 MG 12 hr tablet Take 1,200 mg by mouth daily.     hyoscyamine (LEVSIN SL) 0.125 MG SL tablet USE 1 TABLET UNDER THE TONGUE BEFORE MEALS AND AT BEDTIME AS NEEDED FOR FLARES IN SYMPTOMS, diarrhea/abdominal cramping 90 tablet 3   ketoconazole (NIZORAL) 2 % cream Apply 1 application topically daily as needed for irritation (to affected area. external use only). 30 g 0   losartan (COZAAR) 50 MG tablet Take 1 tablet (50 mg total) by mouth daily. 90 tablet 3   meclizine (ANTIVERT) 25 MG tablet TAKE 1 TABLET BY MOUTH 3 TIMES A DAY AS NEEDED (Patient taking differently: as needed.) 30 tablet 1   metFORMIN (GLUCOPHAGE) 500 MG tablet TAKE ONE TABLET BY MOUTH EVERY MORNING ONE AT LUNCH AND 2 AT BEDTIME (Patient taking differently: TAKE ONE TABLET BY MOUTH EVERY MORNING AND 2 Tablets AT BEDTIME) 360 tablet 0   methenamine (HIPREX) 1 g tablet Take 1 tablet (1 g total) by mouth 2 (two) times daily. 60 tablet 11   mirabegron ER (MYRBETRIQ) 50 MG TB24 tablet Take 1 tablet (50 mg total) by mouth daily. 90 tablet 3   Multiple  Vitamins-Minerals (PRESERVISION AREDS 2) CAPS Take 1 capsule by mouth 2 (two) times daily.      nateglinide (STARLIX) 120 MG tablet TAKE 1 TABLET BY MOUTH 3 TIMES A DAY BEFORE MEALS 270 tablet 0   NON FORMULARY Phillips probiotic  Once a day     omega-3 acid ethyl esters (LOVAZA) 1 g capsule Take 2 g by mouth daily.     omeprazole (PRILOSEC) 20 MG capsule TAKE 1 CAPSULE BY MOUTH EVERY DAY (Patient taking differently: as needed.) 90 capsule 3   OVER THE COUNTER MEDICATION 2 (two) times daily. Vitamin D     One a day  Not sure strength     Vitamin Mixture (ESTER-C) 500-60 MG TABS Take 1 tablet by mouth daily.      Social History   Socioeconomic History   Marital status:  Married    Spouse name: Not on file   Number of children: 3   Years of education: Not on file   Highest education level: Not on file  Occupational History   Occupation: Retired  Tobacco Use   Smoking status: Never   Smokeless tobacco: Never   Tobacco comments:    Never smoked  Vaping Use   Vaping Use: Never used  Substance and Sexual Activity   Alcohol use: No    Alcohol/week: 0.0 standard drinks   Drug use: No   Sexual activity: Not on file  Other Topics Concern   Not on file  Social History Narrative   Not on file   Social Determinants of Health   Financial Resource Strain: Not on file  Food Insecurity: Not on file  Transportation Needs: Not on file  Physical Activity: Not on file  Stress: Not on file  Social Connections: Not on file  Intimate Partner Violence: Not on file   Family History  Problem Relation Age of Onset   Ovarian cancer Mother    Diabetes Father    Heart disease Father    Breast cancer Sister    Liver cancer Sister    Colon cancer Cousin        Paternal side   Diabetes Maternal Aunt    Liver disease Cousin        Maternal side, never drank    OBJECTIVE:  Vitals:   11/02/20 1033  BP: (!) 152/68  Pulse: (!) 113  Resp: 20  Temp: 99.7 F (37.6 C)  SpO2: 96%      General appearance: alert; well-appearing, nontoxic; speaking in full sentences and tolerating own secretions HEENT: NCAT; Ears: EACs clear, TMs pearly gray; Eyes: PERRL.  EOM grossly intact.Nose: nares patent without rhinorrhea, Throat: oropharynx clear, tonsils non erythematous or enlarged, uvula midline  Neck: supple without LAD Lungs: unlabored respirations, symmetrical air entry; cough: absent; no respiratory distress; CTAB Heart: regular rate and rhythm.  Skin: warm and dry; area of erythema to LT labia majora, mildly TTP, no obvious drainage or bleeding Psychological: alert and cooperative; normal mood and affect   ASSESSMENT & PLAN:  1. Sinus congestion   2. Viral URI with cough   3. Cellulitis of groin     Meds ordered this encounter  Medications   doxycycline (VIBRAMYCIN) 100 MG capsule    Sig: Take 1 capsule (100 mg total) by mouth 2 (two) times daily.    Dispense:  20 capsule    Refill:  0    Order Specific Question:   Supervising Provider    Answer:   Raylene Everts [[id number]]   Rash:  Prescribed doxycycline take as directed and to completion Continue to alternate ibuprofen and tylenol as needed for pain and fever Follow up with dermatologist for recheck next week Return or go to the ED if you have any new or worsening symptoms such as increased pain, redness, swelling, discharge, high fever, night sweats, abdominal pain, etc...   URI symptoms:  COVID testing ordered.  It will take between 2-5 days for test results.  Someone will contact you regarding abnormal results.    In the meantime: You should remain isolated in your home for 5 days from symptom onset AND greater than 72 hours after symptoms resolution (absence of fever without the use of fever-reducing medication and improvement in respiratory symptoms), whichever is longer Get plenty of rest and push fluids Use OTC zyrtec for nasal congestion, runny nose, and/or  sore throat Use OTC flonase for nasal  congestion and runny nose Use medications daily for symptom relief Use OTC medications like ibuprofen or tylenol as needed fever or pain Call or go to the ED if you have any new or worsening symptoms such as fever, worsening cough, shortness of breath, chest tightness, chest pain, turning blue, changes in mental status, etc...   Reviewed expectations re: course of current medical issues. Questions answered. Outlined signs and symptoms indicating need for more acute intervention. Patient verbalized understanding. After Visit Summary given.          Lestine Box, PA-C 11/02/20 1052

## 2020-11-02 NOTE — Telephone Encounter (Signed)
Pt was made aware and verbalized understanding. Pt states at the moment she is not having any diarrhea, but will contact us if she does.

## 2020-11-03 LAB — SARS-COV-2, NAA 2 DAY TAT

## 2020-11-03 LAB — NOVEL CORONAVIRUS, NAA: SARS-CoV-2, NAA: DETECTED — AB

## 2020-11-06 ENCOUNTER — Ambulatory Visit (INDEPENDENT_AMBULATORY_CARE_PROVIDER_SITE_OTHER): Payer: Medicare Other | Admitting: Family Medicine

## 2020-11-06 ENCOUNTER — Telehealth: Payer: Self-pay | Admitting: Family Medicine

## 2020-11-06 ENCOUNTER — Other Ambulatory Visit: Payer: Self-pay

## 2020-11-06 DIAGNOSIS — U071 COVID-19: Secondary | ICD-10-CM | POA: Diagnosis not present

## 2020-11-06 DIAGNOSIS — B349 Viral infection, unspecified: Secondary | ICD-10-CM

## 2020-11-06 NOTE — Telephone Encounter (Signed)
[name], dalman are scheduled for a virtual visit with your provider today.    Just as we do with appointments in the office, we must obtain your consent to participate.  Your consent will be active for this visit and any virtual visit you may have with one of our providers in the next 365 days.    If you have a MyChart account, I can also send a copy of this consent to you electronically.  All virtual visits are billed to your insurance company just like a traditional visit in the office.  As this is a virtual visit, video technology does not allow for your provider to perform a traditional examination.  This may limit your provider's ability to fully assess your condition.  If your provider identifies any concerns that need to be evaluated in person or the need to arrange testing such as labs, EKG, etc, we will make arrangements to do so.    Although advances in technology are sophisticated, we cannot ensure that it will always work on either your end or our end.  If the connection with a video visit is poor, we may have to switch to a telephone visit.  With either a video or telephone visit, we are not always able to ensure that we have a secure connection.   I need to obtain your verbal consent now.   Are you willing to proceed with your visit today?   Deborah Gray has provided verbal consent on 11/06/2020 for a virtual visit (video or telephone).   Vicente Males, LPN [id number]  [id number] AM

## 2020-11-06 NOTE — Progress Notes (Signed)
   Subjective:    Patient ID: Deborah Gray, female    DOB: 08-18-1933, 87 y.o.   MRN: [id number]  HPI Pt tested positive for COVID on 11/02/20. Pt states she has been fighting a sinus infection for a few weeks. Pt went to Urgent Care on 11/02/20. Pt having fatigue, low grade fever, unable to excrete phelgm, sharp pain in legs and head. Pt also had rash. Pt had nausea but fought it off. Pt was weak and clammy yesterday but felt better. Pt was unable to get warm on Wednesday and Thursday. Pt is drinking plenty of liquid. Today pt feels weak and washed out.   Virtual Visit via Telephone Note  I connected with Deborah Gray on 11/06/20 at  1:10 PM EDT by telephone and verified that I am speaking with the correct person using two identifiers.  Location: Patient: home Provider: office   I discussed the limitations, risks, security and privacy concerns of performing an evaluation and management service by telephone and the availability of in person appointments. I also discussed with the patient that there may be a patient responsible charge related to this service. The patient expressed understanding and agreed to proceed.   History of Present Illness:    Observations/Objective:   Assessment and Plan:   Follow Up Instructions:    I discussed the assessment and treatment plan with the patient. The patient was provided an opportunity to ask questions and all were answered. The patient agreed with the plan and demonstrated an understanding of the instructions.   The patient was advised to call back or seek an in-person evaluation if the symptoms worsen or if the condition fails to improve as anticipated.  I provided 15 minutes of non-face-to-face time during this encounter.  It is hard to know for certain how long she has had issues had head congestion and drainage for multiple days and then started feeling bad then did COVID test and it was positive so I believe her COVID could  been going on for anywhere from 6 days to a low of 4 days     Review of Systems     Objective:   Physical Exam  Lungs clear heart regular pulse normal extremities no edema      Assessment & Plan:  COVID Seems to be turning the corner We did discuss medication how it could benefit her but at the same time may not necessarily benefit her totally because of how many days she has had this.  She opts to watch this she will call us if any problems warning signs were discussed in detail

## 2020-11-14 DIAGNOSIS — J343 Hypertrophy of nasal turbinates: Secondary | ICD-10-CM | POA: Diagnosis not present

## 2020-11-14 DIAGNOSIS — J342 Deviated nasal septum: Secondary | ICD-10-CM | POA: Diagnosis not present

## 2020-11-14 DIAGNOSIS — J324 Chronic pansinusitis: Secondary | ICD-10-CM | POA: Diagnosis not present

## 2020-11-15 DIAGNOSIS — M9902 Segmental and somatic dysfunction of thoracic region: Secondary | ICD-10-CM | POA: Diagnosis not present

## 2020-11-15 DIAGNOSIS — M9903 Segmental and somatic dysfunction of lumbar region: Secondary | ICD-10-CM | POA: Diagnosis not present

## 2020-11-15 DIAGNOSIS — S134XXA Sprain of ligaments of cervical spine, initial encounter: Secondary | ICD-10-CM | POA: Diagnosis not present

## 2020-11-15 DIAGNOSIS — M9901 Segmental and somatic dysfunction of cervical region: Secondary | ICD-10-CM | POA: Diagnosis not present

## 2020-11-16 ENCOUNTER — Other Ambulatory Visit: Payer: Self-pay

## 2020-11-16 ENCOUNTER — Ambulatory Visit (INDEPENDENT_AMBULATORY_CARE_PROVIDER_SITE_OTHER): Payer: Medicare Other

## 2020-11-16 DIAGNOSIS — N3010 Interstitial cystitis (chronic) without hematuria: Secondary | ICD-10-CM

## 2020-11-16 LAB — URINALYSIS, ROUTINE W REFLEX MICROSCOPIC
Bilirubin, UA: NEGATIVE
Glucose, UA: NEGATIVE
Ketones, UA: NEGATIVE
Nitrite, UA: NEGATIVE
Specific Gravity, UA: 1.02 (ref 1.005–1.030)
Urobilinogen, Ur: 0.2 mg/dL (ref 0.2–1.0)
pH, UA: 7 (ref 5.0–7.5)

## 2020-11-16 LAB — MICROSCOPIC EXAMINATION: Renal Epithel, UA: NONE SEEN /hpf

## 2020-11-16 MED ORDER — HYDROCORTISONE SOD SUC (PF) 100 MG IJ SOLR
100.0000 mg | Freq: Once | INTRAMUSCULAR | Status: AC
  Administered 2020-11-16: 100 mg

## 2020-11-16 MED ORDER — SODIUM BICARBONATE 8.4 % IV SOLN
50.0000 meq | Freq: Once | INTRAVENOUS | Status: AC
  Administered 2020-11-16: 50 meq via INTRAVENOUS

## 2020-11-16 MED ORDER — DIMETHYL SULFOXIDE 50 % IS SOLN
50.0000 mL | Freq: Once | INTRAVESICAL | Status: AC
  Administered 2020-11-16: 50 mL via URETHRAL

## 2020-11-16 MED ORDER — LIDOCAINE HCL 2 % IJ SOLN
10.0000 mL | Freq: Once | INTRAMUSCULAR | Status: AC
  Administered 2020-11-16: 200 mg

## 2020-11-16 NOTE — Progress Notes (Signed)
Bladder Instillation   Due to IC patient is present today for a Bladder Instillation of DMSO. Patient was cleaned and prepped in a sterile fashion with betadine and lidocaine 2% jelly was instilled into the urethra.  A 20FR catheter was inserted, urine return was noted 49ml, urine was yellow in color.  DMSO was instilled into the bladder. Patient held the medication in the bladder for 20 minutes. The catheter was then removed. Patient tolerated well, no complications were noted     Performed by: Jazalyn Mondor LPN   Follow up/ Additional notes: Keep next scheduled NV

## 2020-11-17 ENCOUNTER — Other Ambulatory Visit (HOSPITAL_COMMUNITY): Payer: Self-pay | Admitting: Otolaryngology

## 2020-11-17 ENCOUNTER — Other Ambulatory Visit: Payer: Self-pay | Admitting: Otolaryngology

## 2020-11-17 DIAGNOSIS — J320 Chronic maxillary sinusitis: Secondary | ICD-10-CM

## 2020-11-27 ENCOUNTER — Telehealth: Payer: Self-pay | Admitting: Family Medicine

## 2020-11-27 DIAGNOSIS — E782 Mixed hyperlipidemia: Secondary | ICD-10-CM

## 2020-11-27 DIAGNOSIS — E119 Type 2 diabetes mellitus without complications: Secondary | ICD-10-CM

## 2020-11-27 DIAGNOSIS — Z862 Personal history of diseases of the blood and blood-forming organs and certain disorders involving the immune mechanism: Secondary | ICD-10-CM

## 2020-11-27 NOTE — Telephone Encounter (Signed)
Patient states she has an appointment with Dr. Lacinda Axon 10/20 and wanted her lab request sent to [facility] by Wednesday 10/12 if possible.  CB#  [phone]

## 2020-11-27 NOTE — Telephone Encounter (Signed)
Last labs 06/2020: Lipid, CMP, CBC, HgbA1c

## 2020-11-28 NOTE — Addendum Note (Signed)
Addended by: Dairl Ponder on: 11/28/2020 09:15 AM   Modules accepted: Orders

## 2020-11-28 NOTE — Telephone Encounter (Signed)
Left message to return call to notify patient.

## 2020-11-28 NOTE — Telephone Encounter (Signed)
Orders placed.  Dr. Lacinda Axon

## 2020-11-29 DIAGNOSIS — M9903 Segmental and somatic dysfunction of lumbar region: Secondary | ICD-10-CM | POA: Diagnosis not present

## 2020-11-29 DIAGNOSIS — Z862 Personal history of diseases of the blood and blood-forming organs and certain disorders involving the immune mechanism: Secondary | ICD-10-CM | POA: Diagnosis not present

## 2020-11-29 DIAGNOSIS — E782 Mixed hyperlipidemia: Secondary | ICD-10-CM | POA: Diagnosis not present

## 2020-11-29 DIAGNOSIS — M9902 Segmental and somatic dysfunction of thoracic region: Secondary | ICD-10-CM | POA: Diagnosis not present

## 2020-11-29 DIAGNOSIS — E119 Type 2 diabetes mellitus without complications: Secondary | ICD-10-CM | POA: Diagnosis not present

## 2020-11-29 DIAGNOSIS — S134XXA Sprain of ligaments of cervical spine, initial encounter: Secondary | ICD-10-CM | POA: Diagnosis not present

## 2020-11-29 DIAGNOSIS — M9901 Segmental and somatic dysfunction of cervical region: Secondary | ICD-10-CM | POA: Diagnosis not present

## 2020-11-30 LAB — CBC
Hematocrit: 40.5 % (ref 34.0–46.6)
Hemoglobin: 13.5 g/dL (ref 11.1–15.9)
MCH: 29.7 pg (ref 26.6–33.0)
MCHC: 33.3 g/dL (ref 31.5–35.7)
MCV: 89 fL (ref 79–97)
Platelets: 254 10*3/uL (ref 150–450)
RBC: 4.54 x10E6/uL (ref 3.77–5.28)
RDW: 14.3 % (ref 11.7–15.4)
WBC: 7 10*3/uL (ref 3.4–10.8)

## 2020-11-30 LAB — COMPREHENSIVE METABOLIC PANEL
ALT: 11 IU/L (ref 0–32)
AST: 13 IU/L (ref 0–40)
Albumin/Globulin Ratio: 1.7 (ref 1.2–2.2)
Albumin: 4.3 g/dL (ref 3.6–4.6)
Alkaline Phosphatase: 80 IU/L (ref 44–121)
BUN/Creatinine Ratio: 32 — ABNORMAL HIGH (ref 12–28)
BUN: 23 mg/dL (ref 8–27)
Bilirubin Total: 0.4 mg/dL (ref 0.0–1.2)
CO2: 25 mmol/L (ref 20–29)
Calcium: 10.3 mg/dL (ref 8.7–10.3)
Chloride: 101 mmol/L (ref 96–106)
Creatinine, Ser: 0.73 mg/dL (ref 0.57–1.00)
Globulin, Total: 2.5 g/dL (ref 1.5–4.5)
Glucose: 118 mg/dL — ABNORMAL HIGH (ref 70–99)
Potassium: 4.7 mmol/L (ref 3.5–5.2)
Sodium: 139 mmol/L (ref 134–144)
Total Protein: 6.8 g/dL (ref 6.0–8.5)
eGFR: 81 mL/min/{1.73_m2} (ref >59)

## 2020-11-30 LAB — MICROALBUMIN / CREATININE URINE RATIO
Creatinine, Urine: 70.6 mg/dL
Microalb/Creat Ratio: 297 mg/g creat — ABNORMAL HIGH (ref 0–29)
Microalbumin, Urine: 209.6 ug/mL

## 2020-11-30 LAB — HEMOGLOBIN A1C
Est. average glucose Bld gHb Est-mCnc: 134 mg/dL
Hgb A1c MFr Bld: 6.3 % — ABNORMAL HIGH (ref 4.8–5.6)

## 2020-11-30 LAB — LIPID PANEL
Chol/HDL Ratio: 2.5 ratio (ref 0.0–4.4)
Cholesterol, Total: 168 mg/dL (ref 100–199)
HDL: 68 mg/dL (ref >39)
LDL Chol Calc (NIH): 83 mg/dL (ref 0–99)
Triglycerides: 92 mg/dL (ref 0–149)
VLDL Cholesterol Cal: 17 mg/dL (ref 5–40)

## 2020-11-30 NOTE — Telephone Encounter (Signed)
Patient had labs drawn 11/29/20 and has an office visit with Dr Lacinda Axon for follow up.

## 2020-12-04 DIAGNOSIS — H34832 Tributary (branch) retinal vein occlusion, left eye, with macular edema: Secondary | ICD-10-CM | POA: Diagnosis not present

## 2020-12-07 ENCOUNTER — Other Ambulatory Visit: Payer: Self-pay

## 2020-12-07 ENCOUNTER — Ambulatory Visit (INDEPENDENT_AMBULATORY_CARE_PROVIDER_SITE_OTHER): Payer: Medicare Other | Admitting: Family Medicine

## 2020-12-07 VITALS — Ht 64.0 in | Wt 138.0 lb

## 2020-12-07 DIAGNOSIS — Z23 Encounter for immunization: Secondary | ICD-10-CM | POA: Diagnosis not present

## 2020-12-07 DIAGNOSIS — Z0000 Encounter for general adult medical examination without abnormal findings: Secondary | ICD-10-CM

## 2020-12-07 NOTE — Patient Instructions (Signed)
You're doing well.  Watch out for low blood sugars.  Continue your current medications.  Follow up in 6 months.  Take care  Dr. Lacinda Axon

## 2020-12-07 NOTE — Progress Notes (Addendum)
Subjective:  Patient ID: Deborah Gray, female    DOB: [birth year]  Age: 87 y.o. MRN: [id number]  CC:   HPI [place] is a 87 y.o. female presents to the clinic today for an annual physical exam. Patient states that she is doing well.  She has no significant concerns today.  She does want her flu shot.  She is also here to establish care with me.  Preventative Healthcare Pap smear: No longer recommended (age and s/p hysterectomy) Mammogram: Up to date. Last mammogram 2021. Immunizations Pneumococcal - Up to date. Flu - Needs flu shot today. Shingles - Done. Labs: Recent labs obtained and reviewed with patient today. Alcohol use: No. Smoking/tobacco use: No.   PMH, Surgical Hx, Family Hx, Social History reviewed and updated as below.  Past Medical History:  Diagnosis Date   Arthritis    Benign neoplasm of colon    Constipation    Diaphragmatic hernia without mention of obstruction or gangrene    Diverticulosis of colon (without mention of hemorrhage)    Dysphagia, pharyngoesophageal phase    Esophageal reflux    Essential hypertension    Heart murmur    Hemorrhoids    Hyperlipidemia    Internal hemorrhoids without mention of complication    Left wrist fracture    PONV (postoperative nausea and vomiting)    Stricture and stenosis of esophagus    Type 2 diabetes mellitus (HCC)    Unspecified gastritis and gastroduodenitis without mention of hemorrhage    Past Surgical History:  Procedure Laterality Date   ABDOMINAL HYSTERECTOMY     BACTERIAL OVERGROWTH TEST N/A 09/06/2015   Procedure: BACTERIAL OVERGROWTH TEST;  Surgeon: Daneil Dolin, MD;  [facility];  Service: Endoscopy;  Laterality: N/A;  800   BREAST LUMPECTOMY Right    benign   CATARACT EXTRACTION Bilateral 2006   Implants in both, surgeries done 6 weeks apart   CHOLECYSTECTOMY     COLONOSCOPY  03/08/02   [name]: diverticulosis, internal hemorrhoids, adenomatous colon polyp    COLONOSCOPY  01/23/04   [name]: diverticulosis, internal hemorrhoids   COLONOSCOPY  09/25/05   [name]: diverticulosis   COLONOSCOPY  07/05/09   [name]: severe diverticulosis   COLONOSCOPY  01/21/11   [name]: severe diverticulosis in sigmoid to desc colon, int hemorrhoids, follow up TCS in 5 years   COLONOSCOPY N/A 04/10/2016   [name]: diverticulosis   CYSTOSCOPY W/ URETERAL STENT PLACEMENT Right 03/15/2021   Procedure: CYSTOSCOPY WITH RETROGRADE PYELOGRAM/URETERAL STENT PLACEMENT;  Surgeon: Janith Lima, MD;  Location: [facility];  Service: Urology;  Laterality: Right;   DILATION AND CURETTAGE OF UTERUS     ESOPHAGEAL MANOMETRY  03/21/08   Patterson: findings c/w Nutcracker Esophagus   ESOPHAGOGASTRODUODENOSCOPY  03/08/02   [name]: esophageal stricture, chronic gerd, s/p dilation.    ESOPHAGOGASTRODUODENOSCOPY  01/23/04   [name]: esophageal stricture, gastritis, hiatal hernia   ESOPHAGOGASTRODUODENOSCOPY  09/25/05   [name]: gastritis, benign bx, no H.pylori   ESOPHAGOGASTRODUODENOSCOPY  07/05/09   [name]: gastropathy, benign small bowel bx and gastric bx   ESOPHAGOGASTRODUODENOSCOPY N/A 05/15/2015   Dr. Gala Romney- diffuse moderate inflammation characterized by congestion (edema), erythema, and linear erosions was found in the entire examined stomach. bx= reactive gastropathy   FOOT SURGERY Left    HEMORRHOID BANDING  2017   Dr.[name]   LAPAROSCOPIC VAGINAL HYSTERECTOMY     ORIF WRIST FRACTURE Left 06/23/2018   Procedure: OPEN REDUCTION INTERNAL FIXATION (ORIF) LEFT WRIST FRACTURE;  Surgeon: Edmonia Lynch  D, MD;  Location: [facility];  Service: Orthopedics;  Laterality: Left;  regional arm block   RECTOCELE REPAIR     TOTAL KNEE ARTHROPLASTY Right    Family History  Problem Relation Age of Onset   Ovarian cancer Mother    Diabetes Father    Heart disease Father    Breast cancer Sister    Liver cancer Sister    Colon cancer Cousin        Paternal side    Diabetes Maternal Aunt    Liver disease Cousin        Maternal side, never drank   Social History   Tobacco Use   Smoking status: Never   Smokeless tobacco: Never   Tobacco comments:    Never smoked  Substance Use Topics   Alcohol use: No    Alcohol/week: 0.0 standard drinks    Review of Systems  Constitutional: Negative.   Endocrine:       Occasional hypoglycemia.   Objective:   Today's Vitals: Ht 5\' 4"  (1.626 m)   Wt 138 lb (62.6 kg)   BMI 23.69 kg/m   Physical Exam Vitals and nursing note reviewed.  Constitutional:      General: She is not in acute distress.    Appearance: Normal appearance. She is not ill-appearing.  HENT:     Head: Normocephalic and atraumatic.  Eyes:     General:        Right eye: No discharge.        Left eye: No discharge.     Conjunctiva/sclera: Conjunctivae normal.  Cardiovascular:     Rate and Rhythm: Normal rate and regular rhythm.  Pulmonary:     Effort: Pulmonary effort is normal.     Breath sounds: Normal breath sounds. No wheezing or rales.  Abdominal:     General: There is no distension.     Palpations: Abdomen is soft.     Tenderness: There is no abdominal tenderness.  Skin:    General: Skin is warm.     Findings: No rash.  Neurological:     General: No focal deficit present.     Mental Status: She is alert.  Psychiatric:        Mood and Affect: Mood normal.        Behavior: Behavior normal.     Assessment & Plan:   Problem List Items Addressed This Visit       Other   Encounter for preventative adult health care examination - Primary    Patient is doing well. Preventative health/health maintenance discussed and updated today. Flu shot given today. Labs were reviewed with the patient today.       Other Visit Diagnoses     Immunization due       Relevant Orders   Flu Vaccine QUAD High Dose(Fluad) (Completed)       Follow-up: Return in about 6 months (around 06/07/2021) for Follow up Chronic medical  issues.  [place]

## 2020-12-07 NOTE — Assessment & Plan Note (Signed)
Patient is doing well. Preventative health/health maintenance discussed and updated today. Flu shot given today. Labs were reviewed with the patient today.

## 2020-12-08 ENCOUNTER — Ambulatory Visit (HOSPITAL_COMMUNITY)
Admission: RE | Admit: 2020-12-08 | Discharge: 2020-12-08 | Disposition: A | Discharge status: Home / Self Care | Payer: Medicare Other | Source: Ambulatory Visit | Attending: Otolaryngology | Admitting: Otolaryngology

## 2020-12-08 DIAGNOSIS — J320 Chronic maxillary sinusitis: Secondary | ICD-10-CM

## 2020-12-08 DIAGNOSIS — J341 Cyst and mucocele of nose and nasal sinus: Secondary | ICD-10-CM | POA: Diagnosis not present

## 2020-12-11 ENCOUNTER — Other Ambulatory Visit: Payer: Self-pay

## 2020-12-11 ENCOUNTER — Ambulatory Visit (INDEPENDENT_AMBULATORY_CARE_PROVIDER_SITE_OTHER): Payer: Medicare Other

## 2020-12-11 DIAGNOSIS — N3010 Interstitial cystitis (chronic) without hematuria: Secondary | ICD-10-CM

## 2020-12-11 LAB — URINALYSIS, ROUTINE W REFLEX MICROSCOPIC
Bilirubin, UA: NEGATIVE
Glucose, UA: NEGATIVE
Ketones, UA: NEGATIVE
Nitrite, UA: NEGATIVE
Specific Gravity, UA: 1.02 (ref 1.005–1.030)
Urobilinogen, Ur: 0.2 mg/dL (ref 0.2–1.0)
pH, UA: 7 (ref 5.0–7.5)

## 2020-12-11 LAB — MICROSCOPIC EXAMINATION

## 2020-12-11 MED ORDER — SODIUM BICARBONATE 8.4 % IV SOLN
50.0000 meq | Freq: Once | INTRAVENOUS | Status: AC
  Administered 2020-12-11: 50 meq

## 2020-12-11 MED ORDER — DIMETHYL SULFOXIDE 50 % IS SOLN
50.0000 mL | Freq: Once | INTRAVESICAL | Status: AC
  Administered 2020-12-11: 50 mL via URETHRAL

## 2020-12-11 MED ORDER — HYDROCORTISONE SOD SUCCINATE 100 MG PF FOR IT USE
100.0000 mg | Freq: Once | INTRAMUSCULAR | Status: AC
  Administered 2020-12-11: 100 mg via INTRATHECAL

## 2020-12-11 MED ORDER — LIDOCAINE HCL 2 % IJ SOLN
10.0000 mL | Freq: Once | INTRAMUSCULAR | Status: AC
  Administered 2020-12-11: 200 mg

## 2020-12-11 NOTE — Progress Notes (Signed)
Bladder Instillation DMSO  Due to IC patient is present today for a Bladder Instillation of DMSO treatment. Patient was cleaned and prepped in a sterile fashion with betadine.  A 18FR catheter was inserted, urine return was noted 40ml, urine was yellow in color.  DMSO treatment was instilled into the bladder. The catheter was then removed. Patient tolerated well, no complications were noted pt was instructed to hold the instillation for 20 minutes and then will void it out. Pt voiced understanding.    Preformed by: Estill Bamberg RN  Follow up/ Additional notes: 1 month DMSO treatment

## 2020-12-14 NOTE — Telephone Encounter (Signed)
FYI: Pt called wanting to let [name] know that she is doing much better and that her bowels are finally straightened out. Pt ended up having Covid when she was having the issues with diarrhea.

## 2020-12-14 NOTE — Telephone Encounter (Signed)
Glad she is feeling better  

## 2020-12-17 ENCOUNTER — Other Ambulatory Visit: Payer: Self-pay | Admitting: Family Medicine

## 2020-12-17 DIAGNOSIS — E1100 Type 2 diabetes mellitus with hyperosmolarity without nonketotic hyperglycemic-hyperosmolar coma (NKHHC): Secondary | ICD-10-CM

## 2020-12-20 DIAGNOSIS — M9901 Segmental and somatic dysfunction of cervical region: Secondary | ICD-10-CM | POA: Diagnosis not present

## 2020-12-20 DIAGNOSIS — M9902 Segmental and somatic dysfunction of thoracic region: Secondary | ICD-10-CM | POA: Diagnosis not present

## 2020-12-20 DIAGNOSIS — M9903 Segmental and somatic dysfunction of lumbar region: Secondary | ICD-10-CM | POA: Diagnosis not present

## 2020-12-20 DIAGNOSIS — S134XXA Sprain of ligaments of cervical spine, initial encounter: Secondary | ICD-10-CM | POA: Diagnosis not present

## 2020-12-27 ENCOUNTER — Ambulatory Visit: Payer: Medicare Other | Admitting: Urology

## 2020-12-27 DIAGNOSIS — L280 Lichen simplex chronicus: Secondary | ICD-10-CM | POA: Diagnosis not present

## 2020-12-27 DIAGNOSIS — D485 Neoplasm of uncertain behavior of skin: Secondary | ICD-10-CM | POA: Diagnosis not present

## 2020-12-27 DIAGNOSIS — L859 Epidermal thickening, unspecified: Secondary | ICD-10-CM | POA: Diagnosis not present

## 2020-12-27 DIAGNOSIS — L570 Actinic keratosis: Secondary | ICD-10-CM | POA: Diagnosis not present

## 2020-12-27 DIAGNOSIS — L0100 Impetigo, unspecified: Secondary | ICD-10-CM | POA: Diagnosis not present

## 2021-01-02 DIAGNOSIS — E1142 Type 2 diabetes mellitus with diabetic polyneuropathy: Secondary | ICD-10-CM | POA: Diagnosis not present

## 2021-01-02 DIAGNOSIS — B351 Tinea unguium: Secondary | ICD-10-CM | POA: Diagnosis not present

## 2021-01-02 DIAGNOSIS — L84 Corns and callosities: Secondary | ICD-10-CM | POA: Diagnosis not present

## 2021-01-08 ENCOUNTER — Other Ambulatory Visit: Payer: Self-pay

## 2021-01-08 ENCOUNTER — Ambulatory Visit (INDEPENDENT_AMBULATORY_CARE_PROVIDER_SITE_OTHER): Payer: Medicare Other

## 2021-01-08 DIAGNOSIS — N3010 Interstitial cystitis (chronic) without hematuria: Secondary | ICD-10-CM | POA: Diagnosis not present

## 2021-01-08 LAB — URINALYSIS, ROUTINE W REFLEX MICROSCOPIC
Bilirubin, UA: NEGATIVE
Glucose, UA: NEGATIVE
Ketones, UA: NEGATIVE
Nitrite, UA: NEGATIVE
Specific Gravity, UA: 1.025 (ref 1.005–1.030)
Urobilinogen, Ur: 0.2 mg/dL (ref 0.2–1.0)
pH, UA: 6 (ref 5.0–7.5)

## 2021-01-08 LAB — MICROSCOPIC EXAMINATION
Epithelial Cells (non renal): >10 /hpf — AB (ref 0–10)
RBC, Urine: >30 /hpf — AB (ref 0–2)
Renal Epithel, UA: NONE SEEN /hpf

## 2021-01-08 MED ORDER — SODIUM BICARBONATE 8.4 % IV SOLN
50.0000 meq | Freq: Once | INTRAVENOUS | Status: AC
  Administered 2021-01-08: 50 meq via INTRAVENOUS

## 2021-01-08 MED ORDER — DIMETHYL SULFOXIDE 50 % IS SOLN
50.0000 mL | Freq: Once | INTRAVESICAL | Status: AC
  Administered 2021-01-08: 50 mL via URETHRAL

## 2021-01-08 MED ORDER — HYDROCORTISONE SOD SUC (PF) 100 MG IJ SOLR
100.0000 mg | Freq: Once | INTRAMUSCULAR | Status: AC
  Administered 2021-01-08: 100 mg

## 2021-01-08 MED ORDER — LIDOCAINE HCL 2 % IJ SOLN
10.0000 mL | Freq: Once | INTRAMUSCULAR | Status: AC
Start: 2021-01-08 — End: 2021-01-08
  Administered 2021-01-08: 200 mg

## 2021-01-08 NOTE — Progress Notes (Signed)
Bladder Instillation  Due to IC patient is present today for a Bladder Instillation of DMSO. Patient was cleaned and prepped in a sterile fashion with betadine. A 20FR catheter was inserted, urine return was noted 47ml, urine was yellow in color. DMSO was instilled into the bladder. The catheter was then removed. Patient tolerated well, no complications were noted Patient held in bladder for 20 minutes prior and then bladder was drained and foley catheter was remover.  Performed by: Deserai Cansler LPN  Follow up/ Additional notes: keep next scheduled NV

## 2021-01-09 DIAGNOSIS — J343 Hypertrophy of nasal turbinates: Secondary | ICD-10-CM | POA: Diagnosis not present

## 2021-01-09 DIAGNOSIS — J310 Chronic rhinitis: Secondary | ICD-10-CM | POA: Diagnosis not present

## 2021-01-09 DIAGNOSIS — R0982 Postnasal drip: Secondary | ICD-10-CM | POA: Diagnosis not present

## 2021-01-09 DIAGNOSIS — J342 Deviated nasal septum: Secondary | ICD-10-CM | POA: Diagnosis not present

## 2021-01-14 ENCOUNTER — Other Ambulatory Visit: Payer: Self-pay | Admitting: Physician Assistant

## 2021-01-22 NOTE — Progress Notes (Signed)
   [place] Banding Note:   Deborah Gray is an 87 y.o. female presenting today for consideration of hemorrhoid banding. Last colonoscopy 2018 with diverticulosis. She has a history of adenomatous colon polyps. She has undergone hemorrhoid banding in March/May 2021 by Dr. Gala Romney.    The patient presents with symptomatic grade 3 hemorrhoids, unresponsive to maximal medical therapy, requesting rubber band ligation of her hemorrhoidal disease. All risks, benefits, and alternative forms of therapy were described and informed consent was obtained.  In the left lateral decubitus position, anoscopic examination revealed grade 3 hemorrhoids in the left lateral (predominantly), right anterior position (s). Right posterior less prominent.   The decision was made to band the left internal internal hemorrhoid, and the [place] was used to perform band ligation without complication. Digital anorectal examination was then performed to assure proper positioning of the band, and to adjust the banded tissue as required. The patient was discharged home without pain or other issues. Dietary and behavioral recommendations were given and (if necessary prescriptions were given), along with follow-up instructions. The patient will return in followup and possible additional banding as required.  No complications were encountered and the patient tolerated the procedure well.   Annitta Needs, PhD, ANP-BC [facility]

## 2021-01-23 ENCOUNTER — Ambulatory Visit (INDEPENDENT_AMBULATORY_CARE_PROVIDER_SITE_OTHER): Payer: Medicare Other | Admitting: Gastroenterology

## 2021-01-23 ENCOUNTER — Encounter: Payer: Self-pay | Admitting: Gastroenterology

## 2021-01-23 ENCOUNTER — Other Ambulatory Visit: Payer: Self-pay

## 2021-01-23 VITALS — BP 149/68 | HR 89 | Temp 97.0°F | Ht 64.0 in | Wt 140.6 lb

## 2021-01-23 DIAGNOSIS — K642 Third degree hemorrhoids: Secondary | ICD-10-CM | POA: Diagnosis not present

## 2021-01-23 NOTE — Patient Instructions (Signed)
We will see you back for further banding!  Please call if you need anything, and have a great Christmas!  I enjoyed seeing you again today! As you know, I value our relationship and want to provide genuine, compassionate, and quality care. I welcome your feedback. If you receive a survey regarding your visit,  I greatly appreciate you taking time to fill this out. See you next time!  Annitta Needs, PhD, ANP-BC [facility]

## 2021-01-24 ENCOUNTER — Telehealth: Payer: Self-pay

## 2021-01-24 NOTE — Telephone Encounter (Signed)
PA for Chlordiazepoxide Clidinium through 01/24/22. Approval letter to be scanned into pt's chart.

## 2021-01-29 ENCOUNTER — Ambulatory Visit: Payer: Medicare Other | Admitting: Urology

## 2021-01-30 DIAGNOSIS — H34832 Tributary (branch) retinal vein occlusion, left eye, with macular edema: Secondary | ICD-10-CM | POA: Diagnosis not present

## 2021-02-05 ENCOUNTER — Other Ambulatory Visit: Payer: Self-pay

## 2021-02-05 ENCOUNTER — Other Ambulatory Visit: Payer: Medicare Other

## 2021-02-05 ENCOUNTER — Ambulatory Visit: Payer: Medicare Other

## 2021-02-05 ENCOUNTER — Other Ambulatory Visit: Payer: Self-pay | Admitting: Physician Assistant

## 2021-02-05 ENCOUNTER — Telehealth: Payer: Self-pay

## 2021-02-05 DIAGNOSIS — R300 Dysuria: Secondary | ICD-10-CM | POA: Insufficient documentation

## 2021-02-05 DIAGNOSIS — N3010 Interstitial cystitis (chronic) without hematuria: Secondary | ICD-10-CM

## 2021-02-05 DIAGNOSIS — Z87440 Personal history of urinary (tract) infections: Secondary | ICD-10-CM | POA: Diagnosis not present

## 2021-02-05 LAB — MICROSCOPIC EXAMINATION
Renal Epithel, UA: NONE SEEN /hpf
WBC, UA: >30 /hpf — AB (ref 0–5)

## 2021-02-05 LAB — URINALYSIS, ROUTINE W REFLEX MICROSCOPIC
Bilirubin, UA: NEGATIVE
Glucose, UA: NEGATIVE
Ketones, UA: NEGATIVE
Nitrite, UA: NEGATIVE
Specific Gravity, UA: 1.025 (ref 1.005–1.030)
Urobilinogen, Ur: 0.2 mg/dL (ref 0.2–1.0)
pH, UA: 7 (ref 5.0–7.5)

## 2021-02-05 MED ORDER — NITROFURANTOIN MONOHYD MACRO 100 MG PO CAPS: 100.0000 mg | ORAL_CAPSULE | Freq: Two times a day (BID) | ORAL | 0 refills | Status: AC

## 2021-02-05 NOTE — Progress Notes (Unsigned)
Pt is an 87YO who presented to the office for nurse visit/DSMO tx and asked to r/s due to sxs of dysuria and frequency. UA >30WBCs and moderate bacteria   Diagnoses and all orders for this visit:  Interstitial cystitis  Dysuria   Rx for Macrobid sent to pharmacy. BUN/Creatine normal in past year.  Urine cx ordered.

## 2021-02-05 NOTE — Telephone Encounter (Signed)
Patient here for DSMO tx. Patient complaining on UTI symptoms. Request to reschedule.  PA made aware of lab.

## 2021-02-05 NOTE — Progress Notes (Deleted)
Bladder Instillation   Due to IC patient is present today for a Bladder Instillation of DMSO. Patient was cleaned and prepped in a sterile fashion with betadine. A 20FR catheter was inserted, urine return was noted 21ml, urine was yellow in color. DMSO was instilled into the bladder. The catheter was then removed. Patient tolerated well, no complications were noted Patient held in bladder for 20 minutes prior and then bladder was drained and foley catheter was remover.   Performed by: Madelaine Whipple LPN   Follow up/ Additional notes: keep next scheduled NV

## 2021-02-08 ENCOUNTER — Telehealth: Payer: Self-pay

## 2021-02-08 LAB — URINE CULTURE

## 2021-02-08 NOTE — Telephone Encounter (Signed)
-----   Message from Reynaldo Minium, [place] sent at 02/08/2021  9:11 AM EST ----- Urine culture indicates Macrobid is the appropriate antibx. Pt needs to complete Rx and schedule next DMSO after antibx  ----- Message ----- From: Interface, [facility] Lab Results In Sent: 02/05/2021   4:37 PM EST To: Reynaldo Minium, PA-C

## 2021-02-08 NOTE — Telephone Encounter (Signed)
Patient called stating she was not feeling worse but did not feel like she was getting better. Patient is afebrile.  Deborah Clinton PA made aware. Patient informed that she was on the right abt to treat the infection, to increase her fluids, and give the abt a little more time to work.  Patient voiced understanding and states she will complete the course and call office back next week if she doesn't get better.

## 2021-02-15 ENCOUNTER — Other Ambulatory Visit: Payer: Self-pay

## 2021-02-15 ENCOUNTER — Telehealth: Payer: Self-pay | Admitting: Family Medicine

## 2021-02-15 ENCOUNTER — Telehealth: Payer: Self-pay

## 2021-02-15 ENCOUNTER — Ambulatory Visit: Payer: Medicare Other

## 2021-02-15 VITALS — BP 166/68 | HR 88

## 2021-02-15 DIAGNOSIS — R300 Dysuria: Secondary | ICD-10-CM

## 2021-02-15 LAB — URINALYSIS, ROUTINE W REFLEX MICROSCOPIC
Bilirubin, UA: NEGATIVE
Glucose, UA: NEGATIVE
Ketones, UA: NEGATIVE
Nitrite, UA: NEGATIVE
Specific Gravity, UA: 1.025 (ref 1.005–1.030)
Urobilinogen, Ur: 0.2 mg/dL (ref 0.2–1.0)
pH, UA: 6 (ref 5.0–7.5)

## 2021-02-15 LAB — MICROSCOPIC EXAMINATION
Epithelial Cells (non renal): >10 /hpf — AB (ref 0–10)
RBC, Urine: >30 /hpf — AB (ref 0–2)
Renal Epithel, UA: NONE SEEN /hpf

## 2021-02-15 NOTE — Telephone Encounter (Signed)
Deborah Gray called on the advise of her urologist. She has interstitial cystitis.She wanted [place] to know that she has been on Macrobid x 7 days. She still has blood in her urine and pain in her abdomen. Her glucose this AM was 187 upon waking. After breakfast, her glucose was 180.

## 2021-02-15 NOTE — Telephone Encounter (Signed)
Pt called back  today she said that she is not feeling any better after taking the antibiotic that she was given., finished her dosage a few days ago.  she is having a lot pain in her lower abd , feels like she has blood in urine . And her blood sugar are running high in 400's  I told pt  contact pcp regarding her cbg levels. Please advise.

## 2021-02-15 NOTE — Progress Notes (Signed)
Patient came in today for repeat urine check. Patient reports she still has intermittent hematuria along with pelvic pain at times.  Reviewed urine with PA, Sharee Pimple. Urine sent for repeat culture and ov for possible cysto next week

## 2021-02-15 NOTE — Telephone Encounter (Signed)
Please advise. Thank you

## 2021-02-15 NOTE — Progress Notes (Signed)
Urine appears gross hematuria. UA >30RBC few bact Culture pending No additional antibx at this time Discussed with Dr. Felipa Eth who agrees with plan to hold antib at this time

## 2021-02-16 ENCOUNTER — Telehealth: Payer: Self-pay | Admitting: Family Medicine

## 2021-02-16 NOTE — Telephone Encounter (Signed)
Pt left a message to let Dr Lacinda Axon know that her glucose level is down to 123.

## 2021-02-17 LAB — URINE CULTURE

## 2021-02-20 NOTE — Telephone Encounter (Signed)
Defer to Urology to manage or she can get an appt to be seen by me

## 2021-02-20 NOTE — Telephone Encounter (Signed)
Left message to return call 

## 2021-02-21 ENCOUNTER — Telehealth: Payer: Self-pay

## 2021-02-21 NOTE — Telephone Encounter (Signed)
-----   Message from Reynaldo Minium, [place] sent at 02/21/2021  8:12 AM EST ----- Culture is negative. No additional antibx at this time. Keep appt on Friday. ----- Message ----- From: Lavone Neri Lab Results In Sent: 02/15/2021   2:59 PM EST To: Reynaldo Minium, PA-C

## 2021-02-21 NOTE — Telephone Encounter (Signed)
Patient notified and verbalized understanding and stated she has an appt scheduled with urology about the issue 02/23/21 at 11:45am

## 2021-02-21 NOTE — Telephone Encounter (Signed)
Patient was called with no answer. Message was not able to be left.

## 2021-02-22 ENCOUNTER — Telehealth: Payer: Self-pay

## 2021-02-22 NOTE — Telephone Encounter (Signed)
Patient called today to reschedule cystoscopy for tomorrow due to reason of cystoscopy scope out of order.  Patient recent urine culture negative. Patient asked if she could receive her Triple Treatment for her IC. Patient still having periods of hematuria.  Reviewed with Dr. Alyson Ingles who advised patient should wait for treatment due to bleeding and we will attempt cystoscopy next week once loaner scope is received.

## 2021-02-23 ENCOUNTER — Other Ambulatory Visit: Payer: Medicare Other | Admitting: Urology

## 2021-02-25 ENCOUNTER — Other Ambulatory Visit: Payer: Self-pay | Admitting: Family Medicine

## 2021-02-26 ENCOUNTER — Ambulatory Visit: Payer: Medicare Other | Admitting: Urology

## 2021-02-26 NOTE — Progress Notes (Signed)
Cardiology Office Note    Date:  03/05/2021   ID:  Deborah Gray, DOB [birth year], MRN [id number]   PCP:  Ronald Pippins   [place]  Cardiologist:  None   Advanced Practice Provider:  No care team member to display Electrophysiologist:  None   [id number]   Chief Complaint  Patient presents with   Follow-up    History of Present Illness:  Deborah Gray is a 88 y.o. female  with history of HTN, atypical chest pain with negative stress test in 2006, 2015 in 2017,06/2019, long history of GI and epigastric pain, echo in 2015 LVEF 65 to 70% mild LVH and grade 1 DD.SVT in 2019 treated with beta blocker but stopped at f/u visit 11/2017.  Echo 02/2020 normal LVEF, mild LVH, grade 1 DD.  Patient comes in for 6 month f/u. Has had a lot of back pain from a fall in 2020. Has arthritis and sees a Restaurant manager, fast food. Moving to the [facility] independent [facility]. Rides an exercise bike. No chest pain, dyspnea, dizziness, recent falls. Sometimes has inner ear problems if she moves quickly. Mild ankle edema-trouble getting compression stockings on.      Past Medical History:  Diagnosis Date   Arthritis    Benign neoplasm of colon    Constipation    Diaphragmatic hernia without mention of obstruction or gangrene    Diverticulosis of colon (without mention of hemorrhage)    Dysphagia, pharyngoesophageal phase    Esophageal reflux    Essential hypertension    Heart murmur    Hemorrhoids    Hyperlipidemia    Internal hemorrhoids without mention of complication    Left wrist fracture    PONV (postoperative nausea and vomiting)    Stricture and stenosis of esophagus    Type 2 diabetes mellitus (HCC)    Unspecified gastritis and gastroduodenitis without mention of hemorrhage     Past Surgical History:  Procedure Laterality Date   ABDOMINAL HYSTERECTOMY     BACTERIAL OVERGROWTH TEST N/A 09/06/2015   Procedure: BACTERIAL OVERGROWTH TEST;  Surgeon: Daneil Dolin, MD;  [facility];  Service: Endoscopy;  Laterality: N/A;  800   BREAST LUMPECTOMY Right    benign   CATARACT EXTRACTION Bilateral 2006   Implants in both, surgeries done 6 weeks apart   CHOLECYSTECTOMY     COLONOSCOPY  03/08/02   [place]: diverticulosis, internal hemorrhoids, adenomatous colon polyp   COLONOSCOPY  01/23/04   [name]: diverticulosis, internal hemorrhoids   COLONOSCOPY  09/25/05   [name]: diverticulosis   COLONOSCOPY  07/05/09   [name]: severe diverticulosis   COLONOSCOPY  01/21/11   [name]: severe diverticulosis in sigmoid to desc colon, int hemorrhoids, follow up TCS in 5 years   COLONOSCOPY N/A 04/10/2016   [name]: diverticulosis   DILATION AND CURETTAGE OF UTERUS     ESOPHAGEAL MANOMETRY  03/21/08   [name]: findings c/w [place] Esophagus   ESOPHAGOGASTRODUODENOSCOPY  03/08/02   [name]: esophageal stricture, chronic gerd, s/p dilation.    ESOPHAGOGASTRODUODENOSCOPY  01/23/04   [name]: esophageal stricture, gastritis, hiatal hernia   ESOPHAGOGASTRODUODENOSCOPY  09/25/05   [name]: gastritis, benign bx, no H.pylori   ESOPHAGOGASTRODUODENOSCOPY  07/05/09   [name]: gastropathy, benign small bowel bx and gastric bx   ESOPHAGOGASTRODUODENOSCOPY N/A 05/15/2015   Dr. Gala Romney- diffuse moderate inflammation characterized by congestion (edema), erythema, and linear erosions was found in the entire examined stomach. bx= reactive gastropathy   FOOT SURGERY Left  HEMORRHOID BANDING  2017   Dr.[name]   LAPAROSCOPIC VAGINAL HYSTERECTOMY     ORIF WRIST FRACTURE Left 06/23/2018   Procedure: OPEN REDUCTION INTERNAL FIXATION (ORIF) LEFT WRIST FRACTURE;  Surgeon: Renette Butters, MD;  Location: [facility];  Service: Orthopedics;  Laterality: Left;  regional arm block   RECTOCELE REPAIR     TOTAL KNEE ARTHROPLASTY Right     Current Medications: Current Meds  Medication Sig   acetaminophen (TYLENOL) 500 MG tablet Take 500 mg by  mouth at bedtime.   aspirin EC 81 MG tablet Take 81 mg by mouth at bedtime.   blood glucose meter kit and supplies KIT Dispense based on  insurance preference. Tests once a day E11.9   Carboxymethylcellulose Sodium (THERATEARS) 0.25 % SOLN Apply 1 drop to eye 2 (two) times daily.    clidinium-chlordiazePOXIDE (LIBRAX) 5-2.5 MG capsule Take 1 capsule by mouth 4 (four) times daily as needed (for esophageal symptoms).   clobetasol (TEMOVATE) 0.05 % external solution SMARTSIG:6-8 Drop(s) Topical Daily   CREON 24000-76000 units CPEP TAKE 3 CAPSULES THREE TIMES DAILY WITH MEALS AND ONE CAPSULE WITH SNACK TWICE DAILY   ezetimibe (ZETIA) 10 MG tablet TAKE 1 TABLET BY MOUTH EVERY DAY   guaiFENesin (MUCINEX) 600 MG 12 hr tablet Take 1,200 mg by mouth daily.   hyoscyamine (LEVSIN SL) 0.125 MG SL tablet USE 1 TABLET UNDER THE TONGUE BEFORE MEALS AND AT BEDTIME AS NEEDED FOR FLARES IN SYMPTOMS, diarrhea/abdominal cramping   ketoconazole (NIZORAL) 2 % cream Apply 1 application topically daily as needed for irritation (to affected area. external use only).   lisinopril (ZESTRIL) 20 MG tablet Take 20 mg by mouth daily.   losartan (COZAAR) 50 MG tablet Take 1 tablet (50 mg total) by mouth daily.   meclizine (ANTIVERT) 25 MG tablet TAKE 1 TABLET BY MOUTH 3 TIMES A DAY AS NEEDED (Patient taking differently: as needed.)   metFORMIN (GLUCOPHAGE) 500 MG tablet TAKE ONE TABLET BY MOUTH EVERY MORNING ONE AT LUNCH AND 2 AT BEDTIME   methenamine (HIPREX) 1 g tablet Take 1 tablet (1 g total) by mouth 2 (two) times daily.   mirabegron ER (MYRBETRIQ) 50 MG TB24 tablet Take 1 tablet (50 mg total) by mouth daily.   Multiple Vitamins-Minerals (PRESERVISION AREDS 2) CAPS Take 1 capsule by mouth 2 (two) times daily.    nateglinide (STARLIX) 120 MG tablet TAKE 1 TABLET BY MOUTH 3 TIMES A DAY BEFORE MEALS   NON FORMULARY [place] probiotic  Once a day   omega-3 acid ethyl esters (LOVAZA) 1 g capsule Take 2 g by mouth daily.    omeprazole (PRILOSEC) 20 MG capsule TAKE 1 CAPSULE BY MOUTH EVERY DAY (Patient taking differently: as needed.)   OVER THE COUNTER MEDICATION 2 (two) times daily. Vitamin D     One a day  Not sure strength   Vitamin Mixture (ESTER-C) 500-60 MG TABS Take 1 tablet by mouth daily.    Current Facility-Administered Medications for the 03/05/21 encounter (Office Visit) with Imogene Burn, PA-C  Medication   sodium bicarbonate injection 50 mEq     Allergies:   Adhesive [tape], Estrogens, and Sulfonamide derivatives   Social History   Socioeconomic History   Marital status: Married    Spouse name: Not on file   Number of children: 3   Years of education: Not on file   Highest education level: Not on file  Occupational History   Occupation: Retired  Tobacco Use  Smoking status: Never   Smokeless tobacco: Never   Tobacco comments:    Never smoked  Vaping Use   Vaping Use: Never used  Substance and Sexual Activity   Alcohol use: No    Alcohol/week: 0.0 standard drinks   Drug use: No   Sexual activity: Not on file  Other Topics Concern   Not on file  Social History Narrative   Not on file   Social Determinants of Health   Financial Resource Strain: Not on file  Food Insecurity: Not on file  Transportation Needs: Not on file  Physical Activity: Not on file  Stress: Not on file  Social Connections: Not on file     Family History:  The patient's  family history includes Breast cancer in her sister; Colon cancer in her cousin; Diabetes in her father and maternal aunt; Heart disease in her father; Liver cancer in her sister; Liver disease in her cousin; Ovarian cancer in her mother.   ROS:   Please see the history of present illness.    ROS All other systems reviewed and are negative.   PHYSICAL EXAM:   VS:  BP 114/62    Pulse 84    Ht _0  (1.626 m)    Wt 137 lb (62.1 kg)    SpO2 98%    BMI 23.52 kg/m   Physical Exam  GEN: Thin, elderly, in no acute distress  Neck: no  JVD, carotid bruits, or masses Cardiac:RRR; no murmurs, rubs, or gallops  Respiratory:  clear to auscultation bilaterally, normal work of breathing GI: soft, nontender, nondistended, + BS Ext: trace ankle edema L>R otherwise without cyanosis, clubbing, or edema, Good distal pulses bilaterally Neuro:  Alert and Oriented x 3,  Psych: euthymic mood, full affect  Wt Readings from Last 3 Encounters:  03/05/21 137 lb (62.1 kg)  01/23/21 140 lb 9.6 oz (63.8 kg)  12/07/20 138 lb (62.6 kg)      Studies/Labs Reviewed:   EKG:  EKG is not ordered today.   Recent Labs: 03/15/2020: Magnesium 2.0 11/29/2020: ALT 11; BUN 23; Creatinine, Ser 0.73; Hemoglobin 13.5; Platelets 254; Potassium 4.7; Sodium 139   Lipid Panel    Component Value Date/Time   CHOL 168 11/29/2020 0931   TRIG 92 11/29/2020 0931   HDL 68 11/29/2020 0931   CHOLHDL 2.5 11/29/2020 0931   CHOLHDL 3.6 11/22/2013 0852   VLDL 29 11/22/2013 0852   LDLCALC 83 11/29/2020 0931    Additional studies/ records that were reviewed today include:  Echocardiogram 03/08/2020   1. Left ventricular ejection fraction, by estimation, is 60 to 65%. The left ventricle has normal function. The left ventricle has no regional wall motion abnormalities. There is mild left ventricular hypertrophy. Left ventricular diastolic parameters are consistent with Grade I diastolic dysfunction (impaired relaxation). Elevated left atrial pressure. The average left ventricular global longitudinal strain is -17.6 %. The global longitudinal strain is normal. 2. Right ventricular systolic function is normal. The right ventricular size is normal. 3. Left atrial size was moderately dilated. 4. The mitral valve is normal in structure. Trivial mitral valve regurgitation. No evidence of mitral stenosis. 5. The aortic valve is tricuspid. There is mild calcification of the aortic valve. There is mild thickening of the aortic valve. Aortic valve regurgitation is not  visualized. No aortic stenosis is present. 6. The inferior vena cava is normal in size with greater than 50% respiratory variability, suggesting right atrial pressure of 3 mmHg. Comparison(s): Echocardiogram done 12/06/13 showed  an EF of 65%.     NST 07/08/2019 Narrative & Impression  There was no ST segment deviation noted during stress. The study is normal. There are no perfusion defects consistent with prior infarct or current ischemia. This is a low risk study. The left ventricular ejection fraction is normal (55-65%).        Echocardiogram: 11/2013 Study Conclusions   - Left ventricle: The cavity size was normal. Wall thickness was    increased in a pattern of mild LVH. Systolic function was    vigorous. The estimated ejection fraction was in the range of 65%    to 70%. Wall motion was normal; there were no regional wall    motion abnormalities. Doppler parameters are consistent with    abnormal left ventricular relaxation (grade 1 diastolic    dysfunction). Doppler parameters are consistent with elevated    ventricular end-diastolic filling pressure.  - Aortic valve: Trileaflet; mildly calcified leaflets. Moderate    thickening and calcification involving the left coronary cusp.    There was no significant regurgitation.  - Mitral valve: There was trivial regurgitation.  - Right atrium: Central venous pressure (est): 3 mm Hg.  - Atrial septum: No defect or patent foramen ovale was identified.  - Tricuspid valve: There was trivial regurgitation.  - Pulmonary arteries: PA peak pressure: 32 mm Hg (S).  - Pericardium, extracardiac: There was no pericardial effusion.   Impressions:   - Mild LVH with LVEF 38-75%, grade 1 diastolic dysfunction with    increased filling pressures. Mildly sclerotic aortic valve.    Trivial mitral and tricuspid regurgitation. Upper normal PASP 32    mm mercury.    NST: 07/2015 There was no ST segment deviation noted during stress. No T wave  inversion was noted during stress. The study is normal. This is a low risk study. The left ventricular ejection fraction is hyperdynamic (>65%).     Risk Assessment/Calculations:         ASSESSMENT:    1. Precordial pain   2. Essential hypertension   3. Edema of both lower extremities   4. SVT (supraventricular tachycardia) ([place])   5. Mixed hyperlipidemia   6. Type 2 diabetes mellitus with hyperosmolarity without coma, without long-term current use of insulin (HCC)      PLAN:  In order of problems listed above:  History of chest pain, normal NST 02/2019-no recent chest pain  HTN well controlled  LE edema due to venous insufficiency-elevation and compression hose  SVT-no symptoms  HLD on zetia/lovaza LDL 83 10/20222   DM2 A1C 63 11/2020  Shared Decision Making/Informed Consent        Medication Adjustments/Labs and Tests Ordered: Current medicines are reviewed at length with the patient today.  Concerns regarding medicines are outlined above.  Medication changes, Labs and Tests ordered today are listed in the Patient Instructions below. Patient Instructions  Medication Instructions:  Your physician recommends that you continue on your current medications as directed. Please refer to the Current Medication list given to you today.   Labwork: None today  Testing/Procedures: None today  Follow-Up: 1 year   Any Other Special Instructions Will Be Listed Below (If Applicable).  If you need a refill on your cardiac medications before your next appointment, please call your pharmacy. 1 yr   Signed, Ermalinda Barrios, Hershal Coria  03/05/2021 11:52 AM    [address], [place], [place]  [address] Phone: [phone]; Fax: [phone]

## 2021-02-27 ENCOUNTER — Encounter: Payer: Self-pay | Admitting: Internal Medicine

## 2021-02-28 ENCOUNTER — Other Ambulatory Visit: Payer: Self-pay

## 2021-02-28 ENCOUNTER — Ambulatory Visit (INDEPENDENT_AMBULATORY_CARE_PROVIDER_SITE_OTHER): Payer: Medicare Other | Admitting: Urology

## 2021-02-28 VITALS — BP 147/70 | HR 93

## 2021-02-28 DIAGNOSIS — N3011 Interstitial cystitis (chronic) with hematuria: Secondary | ICD-10-CM | POA: Diagnosis not present

## 2021-02-28 DIAGNOSIS — R310 Gross hematuria: Secondary | ICD-10-CM | POA: Diagnosis not present

## 2021-02-28 DIAGNOSIS — N3010 Interstitial cystitis (chronic) without hematuria: Secondary | ICD-10-CM

## 2021-02-28 DIAGNOSIS — R319 Hematuria, unspecified: Secondary | ICD-10-CM | POA: Diagnosis not present

## 2021-02-28 MED ORDER — DIMETHYL SULFOXIDE 50 % IS SOLN
50.0000 mL | Freq: Once | INTRAVESICAL | Status: AC
  Administered 2021-02-28: 50 mL via URETHRAL

## 2021-02-28 MED ORDER — CIPROFLOXACIN HCL 500 MG PO TABS
500.0000 mg | ORAL_TABLET | Freq: Once | ORAL | Status: AC
Start: 2021-02-28 — End: 2021-03-01
  Administered 2021-03-01: 500 mg via ORAL

## 2021-02-28 MED ORDER — LIDOCAINE HCL 1 % IJ SOLN
20.0000 mL | Freq: Once | INTRAMUSCULAR | Status: AC
  Administered 2021-02-28: 20 mL

## 2021-02-28 MED ORDER — HYDROCORTISONE SOD SUCCINATE 100 MG PF FOR IT USE
100.0000 mg | Freq: Once | INTRAMUSCULAR | Status: AC
  Administered 2021-02-28: 100 mg via INTRATHECAL

## 2021-02-28 MED ORDER — SODIUM BICARBONATE 8.4 % IV SOLN
50.0000 meq | Freq: Once | INTRAVENOUS | Status: AC
  Administered 2021-02-28: 50 meq

## 2021-02-28 NOTE — Progress Notes (Signed)
Patient here today to received DMSO treatment.  Bladder Instillation  Due to IC patient is present today for a Bladder Instillation of DMSO treatment. Patient was cleaned and prepped in a sterile fashion with betadine and lidocaine 2% jelly was instilled into the urethra.  A 20FR catheter was inserted, urine return was noted 45ml, urine was yellow in color.  DMSO treatment was instilled into the bladder. The catheter was then removed. Patient tolerated well, no complications were noted   Performed by: Estill Bamberg RN  Follow up/ Additional notes: 1 month NV for DMSO

## 2021-02-28 NOTE — Progress Notes (Signed)
° °  02/28/21  CC: hematuria   HPI: Ms Ruggerio is a 88yo here ofr cystoscopy for gross hematuria Blood pressure (!) 147/70, pulse 93. NED. A&Ox3.   No respiratory distress   Abd soft, NT, ND Normal external genitalia with patent urethral meatus  Cystoscopy Procedure Note  Patient identification was confirmed, informed consent was obtained, and patient was prepped using Betadine solution.  Lidocaine jelly was administered per urethral meatus.    Procedure: - Flexible cystoscope introduced, without any difficulty.   - Thorough search of the bladder revealed:    normal urethral meatus    normal urothelium    no stones    1-69mm right lateral wall ulcer     no tumors    no urethral polyps    no trabeculation  - Ureteral orifices were normal in position and appearance.  Post-Procedure: - Patient tolerated the procedure well  Assessment/ Plan: RTC 6 months   No follow-ups on file.  [name], MD

## 2021-02-28 NOTE — Progress Notes (Signed)
Urological Symptom Review  Patient is experiencing the following symptoms: Frequent urination Get up at night to urinate Blood in urine   Review of Systems  Gastrointestinal (upper)  : Negative for upper GI symptoms  Gastrointestinal (lower) : Negative for lower GI symptoms  Constitutional : Negative for symptoms  Skin: Negative for skin symptoms  Eyes: Negative for eye symptoms  Ear/Nose/Throat : Negative for Ear/Nose/Throat symptoms  Hematologic/Lymphatic: Negative for Hematologic/Lymphatic symptoms  Cardiovascular : Negative for cardiovascular symptoms  Respiratory : Negative for respiratory symptoms  Endocrine: Negative for endocrine symptoms  Musculoskeletal: Negative for musculoskeletal symptoms  Neurological: Negative for neurological symptoms  Psychologic: Negative for psychiatric symptoms

## 2021-03-01 ENCOUNTER — Telehealth: Payer: Self-pay

## 2021-03-01 ENCOUNTER — Ambulatory Visit: Payer: Medicare Other | Admitting: Nurse Practitioner

## 2021-03-01 ENCOUNTER — Encounter: Payer: Self-pay | Admitting: Urology

## 2021-03-01 LAB — MICROSCOPIC EXAMINATION
Epithelial Cells (non renal): >10 /hpf — AB (ref 0–10)
Renal Epithel, UA: NONE SEEN /hpf

## 2021-03-01 LAB — URINALYSIS, ROUTINE W REFLEX MICROSCOPIC
Bilirubin, UA: NEGATIVE
Glucose, UA: NEGATIVE
Ketones, UA: NEGATIVE
Nitrite, UA: NEGATIVE
Specific Gravity, UA: 1.02 (ref 1.005–1.030)
Urobilinogen, Ur: 0.2 mg/dL (ref 0.2–1.0)
pH, UA: 6.5 (ref 5.0–7.5)

## 2021-03-01 NOTE — Telephone Encounter (Signed)
Patient called complaining of thigh pain unrelated to urinary issues.  Patient advised to try heating pad and OTC NSAID, if pain became worse to call PCP due to clinic being closed the next day.  Patient voiced understanding.

## 2021-03-01 NOTE — Telephone Encounter (Signed)
Patient left yesterday without taking Cipro. Patient on the way to the office and will take Cipro 500 mg now and start Cipro 250 mg bid x 1 dose.

## 2021-03-01 NOTE — Telephone Encounter (Signed)
Patient left a voice message:   took medication and is having thigh pain.  Please call pt back at [phone].  Thanks,  Helene Kelp

## 2021-03-01 NOTE — Patient Instructions (Signed)

## 2021-03-05 ENCOUNTER — Ambulatory Visit (INDEPENDENT_AMBULATORY_CARE_PROVIDER_SITE_OTHER): Payer: Medicare Other | Admitting: Physician Assistant

## 2021-03-05 ENCOUNTER — Ambulatory Visit: Payer: Medicare Other | Admitting: Urology

## 2021-03-05 ENCOUNTER — Encounter: Payer: Self-pay | Admitting: Physician Assistant

## 2021-03-05 VITALS — BP 114/62 | HR 84 | Ht 64.0 in | Wt 137.0 lb

## 2021-03-05 DIAGNOSIS — I471 Supraventricular tachycardia: Secondary | ICD-10-CM

## 2021-03-05 DIAGNOSIS — R072 Precordial pain: Secondary | ICD-10-CM | POA: Diagnosis not present

## 2021-03-05 DIAGNOSIS — I10 Essential (primary) hypertension: Secondary | ICD-10-CM

## 2021-03-05 DIAGNOSIS — R600 Localized edema: Secondary | ICD-10-CM

## 2021-03-05 DIAGNOSIS — E1100 Type 2 diabetes mellitus with hyperosmolarity without nonketotic hyperglycemic-hyperosmolar coma (NKHHC): Secondary | ICD-10-CM

## 2021-03-05 DIAGNOSIS — E782 Mixed hyperlipidemia: Secondary | ICD-10-CM

## 2021-03-05 NOTE — Patient Instructions (Signed)
Medication Instructions:  Your physician recommends that you continue on your current medications as directed. Please refer to the Current Medication list given to you today.   Labwork: None today  Testing/Procedures: None today  Follow-Up: 1 year   Any Other Special Instructions Will Be Listed Below (If Applicable).  If you need a refill on your cardiac medications before your next appointment, please call your pharmacy. 1 yr

## 2021-03-06 ENCOUNTER — Other Ambulatory Visit: Payer: Self-pay | Admitting: Family Medicine

## 2021-03-06 DIAGNOSIS — E1100 Type 2 diabetes mellitus with hyperosmolarity without nonketotic hyperglycemic-hyperosmolar coma (NKHHC): Secondary | ICD-10-CM

## 2021-03-07 ENCOUNTER — Ambulatory Visit: Payer: Medicare Other | Admitting: Urology

## 2021-03-09 ENCOUNTER — Other Ambulatory Visit: Payer: Medicare Other | Admitting: Urology

## 2021-03-13 DIAGNOSIS — L84 Corns and callosities: Secondary | ICD-10-CM | POA: Diagnosis not present

## 2021-03-13 DIAGNOSIS — B351 Tinea unguium: Secondary | ICD-10-CM | POA: Diagnosis not present

## 2021-03-13 DIAGNOSIS — E1142 Type 2 diabetes mellitus with diabetic polyneuropathy: Secondary | ICD-10-CM | POA: Diagnosis not present

## 2021-03-14 ENCOUNTER — Other Ambulatory Visit: Payer: Medicare Other

## 2021-03-14 ENCOUNTER — Other Ambulatory Visit: Payer: Self-pay

## 2021-03-14 ENCOUNTER — Telehealth: Payer: Self-pay

## 2021-03-14 ENCOUNTER — Telehealth: Payer: Self-pay | Admitting: Urology

## 2021-03-14 DIAGNOSIS — Z87440 Personal history of urinary (tract) infections: Secondary | ICD-10-CM | POA: Diagnosis not present

## 2021-03-14 DIAGNOSIS — R319 Hematuria, unspecified: Secondary | ICD-10-CM | POA: Diagnosis not present

## 2021-03-14 LAB — MICROSCOPIC EXAMINATION
Bacteria, UA: NONE SEEN
RBC, Urine: >30 /hpf — AB (ref 0–2)
Renal Epithel, UA: NONE SEEN /hpf

## 2021-03-14 LAB — URINALYSIS, ROUTINE W REFLEX MICROSCOPIC
Bilirubin, UA: NEGATIVE
Glucose, UA: NEGATIVE
Ketones, UA: NEGATIVE
Nitrite, UA: NEGATIVE
Specific Gravity, UA: 1.02 (ref 1.005–1.030)
Urobilinogen, Ur: 0.2 mg/dL (ref 0.2–1.0)
pH, UA: 7 (ref 5.0–7.5)

## 2021-03-14 MED ORDER — NITROFURANTOIN MONOHYD MACRO 100 MG PO CAPS: 100.0000 mg | ORAL_CAPSULE | Freq: Two times a day (BID) | ORAL | 0 refills | Status: DC

## 2021-03-14 NOTE — Telephone Encounter (Signed)
Patient called and advised that she was having pain when urinating and wanted to come by and give a urine sample to ensure that she does not have another UTI. Patient was scheduled for lab and will be coming by this afternoon.

## 2021-03-14 NOTE — Telephone Encounter (Signed)
Patient left UA sample today. Reviewed UA with Dr. Alyson Ingles,  new order for antibiotics sent in. Patient notified.

## 2021-03-14 NOTE — Telephone Encounter (Signed)
Patient called advising she is experiencing a lot of pain across her lower abdomen/stomach that the heating pad is not helping.  Her pain level is 7 on scale of 1-10; no fever or bleeding but there is an increased frequency & urgency. The patient is moving today & is asking if she can stop by the office after lunch to leave a urine sample for testing.  Please call back to advise.  Thank you

## 2021-03-15 ENCOUNTER — Encounter (HOSPITAL_COMMUNITY)
Admission: EM | Disposition: A | Discharge status: Home / Self Care | Payer: Self-pay | Source: Home / Self Care | Attending: Internal Medicine

## 2021-03-15 ENCOUNTER — Encounter (HOSPITAL_COMMUNITY): Payer: Self-pay | Admitting: *Deleted

## 2021-03-15 ENCOUNTER — Inpatient Hospital Stay (HOSPITAL_COMMUNITY)
Admission: EM | Admit: 2021-03-15 | Discharge: 2021-03-17 | DRG: 854 | Disposition: A | Discharge status: Home / Self Care | Payer: Medicare Other | Attending: Internal Medicine | Admitting: Internal Medicine

## 2021-03-15 ENCOUNTER — Emergency Department (HOSPITAL_COMMUNITY): Payer: Medicare Other

## 2021-03-15 DIAGNOSIS — Z7982 Long term (current) use of aspirin: Secondary | ICD-10-CM

## 2021-03-15 DIAGNOSIS — D72829 Elevated white blood cell count, unspecified: Secondary | ICD-10-CM

## 2021-03-15 DIAGNOSIS — E1165 Type 2 diabetes mellitus with hyperglycemia: Secondary | ICD-10-CM | POA: Diagnosis not present

## 2021-03-15 DIAGNOSIS — Z79899 Other long term (current) drug therapy: Secondary | ICD-10-CM | POA: Diagnosis not present

## 2021-03-15 DIAGNOSIS — Z8041 Family history of malignant neoplasm of ovary: Secondary | ICD-10-CM | POA: Diagnosis not present

## 2021-03-15 DIAGNOSIS — I700 Atherosclerosis of aorta: Secondary | ICD-10-CM | POA: Diagnosis not present

## 2021-03-15 DIAGNOSIS — Z7984 Long term (current) use of oral hypoglycemic drugs: Secondary | ICD-10-CM

## 2021-03-15 DIAGNOSIS — N390 Urinary tract infection, site not specified: Secondary | ICD-10-CM | POA: Diagnosis not present

## 2021-03-15 DIAGNOSIS — Z800 Family history of malignant neoplasm of digestive organs: Secondary | ICD-10-CM | POA: Diagnosis not present

## 2021-03-15 DIAGNOSIS — Z96651 Presence of right artificial knee joint: Secondary | ICD-10-CM | POA: Diagnosis present

## 2021-03-15 DIAGNOSIS — N200 Calculus of kidney: Secondary | ICD-10-CM

## 2021-03-15 DIAGNOSIS — Z803 Family history of malignant neoplasm of breast: Secondary | ICD-10-CM

## 2021-03-15 DIAGNOSIS — R310 Gross hematuria: Secondary | ICD-10-CM | POA: Diagnosis present

## 2021-03-15 DIAGNOSIS — E785 Hyperlipidemia, unspecified: Secondary | ICD-10-CM

## 2021-03-15 DIAGNOSIS — E119 Type 2 diabetes mellitus without complications: Secondary | ICD-10-CM | POA: Diagnosis not present

## 2021-03-15 DIAGNOSIS — Z90710 Acquired absence of both cervix and uterus: Secondary | ICD-10-CM

## 2021-03-15 DIAGNOSIS — N136 Pyonephrosis: Secondary | ICD-10-CM | POA: Diagnosis not present

## 2021-03-15 DIAGNOSIS — Z9049 Acquired absence of other specified parts of digestive tract: Secondary | ICD-10-CM | POA: Diagnosis not present

## 2021-03-15 DIAGNOSIS — N201 Calculus of ureter: Secondary | ICD-10-CM | POA: Diagnosis not present

## 2021-03-15 DIAGNOSIS — E871 Hypo-osmolality and hyponatremia: Secondary | ICD-10-CM | POA: Diagnosis not present

## 2021-03-15 DIAGNOSIS — K219 Gastro-esophageal reflux disease without esophagitis: Secondary | ICD-10-CM | POA: Diagnosis present

## 2021-03-15 DIAGNOSIS — Z8249 Family history of ischemic heart disease and other diseases of the circulatory system: Secondary | ICD-10-CM

## 2021-03-15 DIAGNOSIS — I10 Essential (primary) hypertension: Secondary | ICD-10-CM | POA: Diagnosis present

## 2021-03-15 DIAGNOSIS — A419 Sepsis, unspecified organism: Principal | ICD-10-CM

## 2021-03-15 DIAGNOSIS — Z20822 Contact with and (suspected) exposure to covid-19: Secondary | ICD-10-CM | POA: Diagnosis present

## 2021-03-15 DIAGNOSIS — Z833 Family history of diabetes mellitus: Secondary | ICD-10-CM | POA: Diagnosis not present

## 2021-03-15 DIAGNOSIS — R109 Unspecified abdominal pain: Secondary | ICD-10-CM | POA: Diagnosis not present

## 2021-03-15 HISTORY — PX: CYSTOSCOPY W/ URETERAL STENT PLACEMENT: SHX1429

## 2021-03-15 LAB — URINALYSIS, ROUTINE W REFLEX MICROSCOPIC
Bilirubin Urine: NEGATIVE
Glucose, UA: NEGATIVE mg/dL
Hgb urine dipstick: NEGATIVE
Ketones, ur: NEGATIVE mg/dL
Nitrite: NEGATIVE
Protein, ur: 30 mg/dL — AB
Specific Gravity, Urine: 1.015 (ref 1.005–1.030)
WBC, UA: >50 WBC/hpf — ABNORMAL HIGH (ref 0–5)
pH: 5 (ref 5.0–8.0)

## 2021-03-15 LAB — CBC WITH DIFFERENTIAL/PLATELET
Abs Immature Granulocytes: 0.08 10*3/uL — ABNORMAL HIGH (ref 0.00–0.07)
Basophils Absolute: 0 10*3/uL (ref 0.0–0.1)
Basophils Relative: 0 %
Eosinophils Absolute: 0 10*3/uL (ref 0.0–0.5)
Eosinophils Relative: 0 %
HCT: 41.5 % (ref 36.0–46.0)
Hemoglobin: 13.5 g/dL (ref 12.0–15.0)
Immature Granulocytes: 1 %
Lymphocytes Relative: 7 %
Lymphs Abs: 0.9 10*3/uL (ref 0.7–4.0)
MCH: 29.9 pg (ref 26.0–34.0)
MCHC: 32.5 g/dL (ref 30.0–36.0)
MCV: 92 fL (ref 80.0–100.0)
Monocytes Absolute: 1.2 10*3/uL — ABNORMAL HIGH (ref 0.1–1.0)
Monocytes Relative: 8 %
Neutro Abs: 12.3 10*3/uL — ABNORMAL HIGH (ref 1.7–7.7)
Neutrophils Relative %: 84 %
Platelets: 289 10*3/uL (ref 150–400)
RBC: 4.51 MIL/uL (ref 3.87–5.11)
RDW: 14.1 % (ref 11.5–15.5)
WBC: 14.5 10*3/uL — ABNORMAL HIGH (ref 4.0–10.5)
nRBC: 0 % (ref 0.0–0.2)

## 2021-03-15 LAB — BASIC METABOLIC PANEL
Anion gap: 7 (ref 5–15)
BUN: 21 mg/dL (ref 8–23)
CO2: 25 mmol/L (ref 22–32)
Calcium: 9 mg/dL (ref 8.9–10.3)
Chloride: 101 mmol/L (ref 98–111)
Creatinine, Ser: 0.89 mg/dL (ref 0.44–1.00)
GFR, Estimated: >60 mL/min (ref >60)
Glucose, Bld: 171 mg/dL — ABNORMAL HIGH (ref 70–99)
Potassium: 3.7 mmol/L (ref 3.5–5.1)
Sodium: 133 mmol/L — ABNORMAL LOW (ref 135–145)

## 2021-03-15 LAB — RESP PANEL BY RT-PCR (FLU A&B, COVID) ARPGX2
Influenza A by PCR: NEGATIVE
Influenza B by PCR: NEGATIVE
SARS Coronavirus 2 by RT PCR: NEGATIVE

## 2021-03-15 LAB — LACTIC ACID, PLASMA: Lactic Acid, Venous: 1.5 mmol/L (ref 0.5–1.9)

## 2021-03-15 SURGERY — CYSTOSCOPY, WITH RETROGRADE PYELOGRAM AND URETERAL STENT INSERTION
Anesthesia: General | Site: Perineum | Laterality: Right

## 2021-03-15 MED ORDER — FENTANYL CITRATE PF 50 MCG/ML IJ SOSY
50.0000 ug | PREFILLED_SYRINGE | Freq: Once | INTRAMUSCULAR | Status: AC
  Administered 2021-03-15: 50 ug via INTRAVENOUS
  Filled 2021-03-15: qty 1

## 2021-03-15 MED ORDER — INSULIN ASPART 100 UNIT/ML IJ SOLN
0.0000 [IU] | INTRAMUSCULAR | Status: DC
  Administered 2021-03-16: 3 [IU] via SUBCUTANEOUS
  Administered 2021-03-16 (×2): 2 [IU] via SUBCUTANEOUS
  Administered 2021-03-16: 3 [IU] via SUBCUTANEOUS
  Administered 2021-03-16: 2 [IU] via SUBCUTANEOUS
  Administered 2021-03-17 (×2): 1 [IU] via SUBCUTANEOUS
  Filled 2021-03-15: qty 1

## 2021-03-15 MED ORDER — SODIUM CHLORIDE 0.9 % IV SOLN
1.0000 g | Freq: Once | INTRAVENOUS | Status: AC
  Administered 2021-03-15: 1 g via INTRAVENOUS
  Filled 2021-03-15: qty 10

## 2021-03-15 MED ORDER — MORPHINE SULFATE (PF) 2 MG/ML IV SOLN: 2.0000 mg | INTRAVENOUS | Status: DC | PRN

## 2021-03-15 MED ORDER — SODIUM CHLORIDE 0.9 % IV SOLN
1.0000 g | INTRAVENOUS | Status: DC
  Administered 2021-03-16: 1 g via INTRAVENOUS
  Filled 2021-03-15 (×2): qty 10

## 2021-03-15 MED ORDER — ACETAMINOPHEN 325 MG PO TABS
650.0000 mg | ORAL_TABLET | Freq: Four times a day (QID) | ORAL | Status: DC | PRN
  Administered 2021-03-16 – 2021-03-17 (×2): 650 mg via ORAL
  Filled 2021-03-15 (×2): qty 2

## 2021-03-15 MED ORDER — IOHEXOL 300 MG/ML  SOLN
100.0000 mL | Freq: Once | INTRAMUSCULAR | Status: AC | PRN
  Administered 2021-03-15: 100 mL via INTRAVENOUS

## 2021-03-15 SURGICAL SUPPLY — 17 items
BAG DRN RND TRDRP ANRFLXCHMBR (UROLOGICAL SUPPLIES) ×1
BAG URINE DRAIN 2000ML AR STRL (UROLOGICAL SUPPLIES) ×1 IMPLANT
BAG URO CATCHER STRL LF (MISCELLANEOUS) ×3 IMPLANT
CATH FOLEY 2WAY SLVR  5CC 14FR (CATHETERS) ×2
CATH FOLEY 2WAY SLVR 5CC 14FR (CATHETERS) IMPLANT
CATH URETL OPEN 5X70 (CATHETERS) ×1 IMPLANT
CLOTH BEACON ORANGE TIMEOUT ST (SAFETY) ×3 IMPLANT
GLOVE SURG ENC TEXT LTX SZ7 (GLOVE) ×3 IMPLANT
GOWN STRL REUS W/TWL LRG LVL3 (GOWN DISPOSABLE) ×3 IMPLANT
GUIDEWIRE STR DUAL SENSOR (WIRE) ×3 IMPLANT
GUIDEWIRE ZIPWRE .038 STRAIGHT (WIRE) IMPLANT
MANIFOLD NEPTUNE II (INSTRUMENTS) ×3 IMPLANT
PACK CYSTO (CUSTOM PROCEDURE TRAY) ×3 IMPLANT
STENT URET 6FRX24 CONTOUR (STENTS) ×1 IMPLANT
SYR 10ML LL (SYRINGE) ×3 IMPLANT
TUBING CONNECTING 10 (TUBING) ×3 IMPLANT
TUBING UROLOGY SET (TUBING) IMPLANT

## 2021-03-15 NOTE — H&P (Signed)
History and Physical  [place] TKZ:[id number] DOB: 1933/03/11 DOA: 03/15/2021  Referring physician: Davonna Belling, MD PCP: Coral Spikes, DO  Patient coming from: Home  Chief Complaint: Painful urination  HPI: Deborah Gray is a 88 y.o. female with medical history significant for hypertension, hyperlipidemia, T2DM who presents to the emergency department due to several day onset of lower abdominal pain.  Patient complained of about 5-week onset of blood in urine and she was following up with her urologist due to history of interstitial cystitis, the blood in urine improved and she now complains of several day onset of painful urination and lower abdominal pain.  She endorsed fever up to 102F today, she took Tylenol which seemed to improve the fever and she decided to go to the ED for further evaluation and management.  Urinalysis and urine culture done yesterday by urology was still pending, patient was started on antibiotics yesterday  ED Course:  In the emergency department, she was febrile with a temperature of 102F, BP 143/68 and was tachycardic, but other vital signs were within normal range.  Work-up in the ED showed normal CBC except for leukocytosis and normal BMP except for mild hyponatremia and hyperglycemia, lactic acid 1.5, urinalysis was positive for large leukocytes and >50 leukocytes.  Influenza A, B, SARS coronavirus 2 was negative. CT abdomen and pelvis with contrast showed obstructing 7 mm stone in the distal right ureter with moderate to advanced right hydroureteronephrosis. Patient was treated with IV ceftriaxone, IV fentanyl was given.  Urologist (Dr. Abner Greenspan) was consulted and recommended admitting patient to [facility] with possibility of stenting her tonight.  Review of Systems: A full 10 point Review of Systems was done, except as stated above, all other Review of systems were negative.  Past Medical History:  Diagnosis Date   Arthritis    Benign  neoplasm of colon    Constipation    Diaphragmatic hernia without mention of obstruction or gangrene    Diverticulosis of colon (without mention of hemorrhage)    Dysphagia, pharyngoesophageal phase    Esophageal reflux    Essential hypertension    Heart murmur    Hemorrhoids    Hyperlipidemia    Internal hemorrhoids without mention of complication    Left wrist fracture    PONV (postoperative nausea and vomiting)    Stricture and stenosis of esophagus    Type 2 diabetes mellitus (HCC)    Unspecified gastritis and gastroduodenitis without mention of hemorrhage    Past Surgical History:  Procedure Laterality Date   ABDOMINAL HYSTERECTOMY     BACTERIAL OVERGROWTH TEST N/A 09/06/2015   Procedure: BACTERIAL OVERGROWTH TEST;  Surgeon: Daneil Dolin, MD;  [facility]: [facility];  Service: Endoscopy;  Laterality: N/A;  800   BREAST LUMPECTOMY Right    benign   CATARACT EXTRACTION Bilateral 2006   Implants in both, surgeries done 6 weeks apart   CHOLECYSTECTOMY     COLONOSCOPY  03/08/02   Patterson: diverticulosis, internal hemorrhoids, adenomatous colon polyp   COLONOSCOPY  01/23/04   [name]: diverticulosis, internal hemorrhoids   COLONOSCOPY  09/25/05   [name]: diverticulosis   COLONOSCOPY  07/05/09   [name]: severe diverticulosis   COLONOSCOPY  01/21/11   [name]: severe diverticulosis in sigmoid to desc colon, int hemorrhoids, follow up TCS in 5 years   COLONOSCOPY N/A 04/10/2016   [name]: diverticulosis   DILATION AND CURETTAGE OF UTERUS     ESOPHAGEAL MANOMETRY  03/21/08   [name]: findings c/w  Nutcracker Esophagus   ESOPHAGOGASTRODUODENOSCOPY  03/08/02   [name]: esophageal stricture, chronic gerd, s/p dilation.    ESOPHAGOGASTRODUODENOSCOPY  01/23/04   [name]: esophageal stricture, gastritis, hiatal hernia   ESOPHAGOGASTRODUODENOSCOPY  09/25/05   [name]: gastritis, benign bx, no H.pylori   ESOPHAGOGASTRODUODENOSCOPY  07/05/09   [name]: gastropathy, benign  small bowel bx and gastric bx   ESOPHAGOGASTRODUODENOSCOPY N/A 05/15/2015   Dr. Gala Romney- diffuse moderate inflammation characterized by congestion (edema), erythema, and linear erosions was found in the entire examined stomach. bx= reactive gastropathy   FOOT SURGERY Left    HEMORRHOID BANDING  2017   Dr.[name]   LAPAROSCOPIC VAGINAL HYSTERECTOMY     ORIF WRIST FRACTURE Left 06/23/2018   Procedure: OPEN REDUCTION INTERNAL FIXATION (ORIF) LEFT WRIST FRACTURE;  Surgeon: Renette Butters, MD;  Location: [place];  Service: Orthopedics;  Laterality: Left;  regional arm block   RECTOCELE REPAIR     TOTAL KNEE ARTHROPLASTY Right     Social History:  reports that she has never smoked. She has never used smokeless tobacco. She reports that she does not drink alcohol and does not use drugs.   Allergies  Allergen Reactions   Adhesive [Tape] Other (See Comments)    Reaction:  Blisters    Estrogens Other (See Comments)    Reaction:  Migraines    Sulfonamide Derivatives Rash    Family History  Problem Relation Age of Onset   Ovarian cancer Mother    Diabetes Father    Heart disease Father    Breast cancer Sister    Liver cancer Sister    Colon cancer Cousin        Paternal side   Diabetes Maternal Aunt    Liver disease Cousin        Maternal side, never drank     Prior to Admission medications   Medication Sig Start Date End Date Taking? Authorizing Provider  acetaminophen (TYLENOL) 500 MG tablet Take 500 mg by mouth at bedtime.   Yes [provider]  aspirin EC 81 MG tablet Take 81 mg by mouth at bedtime.   Yes [provider]  Carboxymethylcellulose Sodium (THERATEARS) 0.25 % SOLN Apply 1 drop to eye 2 (two) times daily.    Yes [provider]  clidinium-chlordiazePOXIDE (LIBRAX) 5-2.5 MG capsule Take 1 capsule by mouth 4 (four) times daily as needed (for esophageal symptoms). 09/20/20  Yes Neil Crouch S, PA-C  CREON 24000-76000 units CPEP TAKE  3 CAPSULES THREE TIMES DAILY WITH MEALS AND ONE CAPSULE WITH SNACK TWICE DAILY Patient taking differently: Take 3 capsules by mouth See admin instructions. TAKE 3 CAPSULES THREE TIMES DAILY WITH MEALS AND ONE CAPSULE WITH SNACK TWICE DAILY 03/19/20  Yes Carlis Stable, NP  ezetimibe (ZETIA) 10 MG tablet TAKE 1 TABLET BY MOUTH EVERY DAY 01/16/21  Yes Strader, [place] M, PA-C  guaiFENesin (MUCINEX) 600 MG 12 hr tablet Take 1,200 mg by mouth daily.   Yes [provider]  hyoscyamine (LEVSIN SL) 0.125 MG SL tablet USE 1 TABLET UNDER THE TONGUE BEFORE MEALS AND AT BEDTIME AS NEEDED FOR FLARES IN SYMPTOMS, diarrhea/abdominal cramping 09/20/20  Yes Mahala Menghini, PA-C  ipratropium (ATROVENT) 0.06 % nasal spray Place into both nostrils. 12/09/20  Yes [provider]  ketoconazole (NIZORAL) 2 % cream Apply 1 application topically daily as needed for irritation (to affected area. external use only). 10/27/20  Yes Mahala Menghini, PA-C  lisinopril (ZESTRIL) 20 MG tablet Take 20 mg by  mouth daily. 01/15/21  Yes [provider]  losartan (COZAAR) 50 MG tablet Take 1 tablet (50 mg total) by mouth daily. 09/11/20  Yes Verta Ellen., NP  meclizine (ANTIVERT) 25 MG tablet TAKE 1 TABLET BY MOUTH 3 TIMES A DAY AS NEEDED Patient taking differently: Take 25 mg by mouth 3 (three) times daily. 06/08/20  Yes Nilda Simmer, NP  metFORMIN (GLUCOPHAGE) 500 MG tablet TAKE ONE TABLET BY MOUTH EVERY MORNING ONE AT LUNCH AND 2 AT BEDTIME Patient taking differently: Take 500 mg by mouth See admin instructions. TAKE ONE TABLET BY MOUTH EVERY MORNING ONE AT LUNCH AND 2 AT BEDTIME 02/26/21  Yes Cook, Jayce G, DO  methenamine (HIPREX) 1 g tablet Take 1 tablet (1 g total) by mouth 2 (two) times daily. 06/21/20  Yes McKenzie, Candee Furbish, MD  mirabegron ER (MYRBETRIQ) 50 MG TB24 tablet Take 1 tablet (50 mg total) by mouth daily. 10/25/20  Yes McKenzie, Candee Furbish, MD  Multiple Vitamins-Minerals (PRESERVISION AREDS  2) CAPS Take 1 capsule by mouth 2 (two) times daily.    Yes [provider]  nitrofurantoin, macrocrystal-monohydrate, (MACROBID) 100 MG capsule Take 1 capsule (100 mg total) by mouth every 12 (twelve) hours. 03/14/21  Yes McKenzie, Candee Furbish, MD  amoxicillin (AMOXIL) 500 MG capsule SMARTSIG:4 Capsule(s) By Mouth Once Patient not taking: Reported on 03/15/2021 01/04/21   [provider]  blood glucose meter kit and supplies KIT Dispense based on  insurance preference. Tests once a day E11.9 09/25/18   [name], MD  clobetasol (TEMOVATE) 0.05 % external solution SMARTSIG:6-8 Drop(s) Topical Daily Patient not taking: Reported on 03/15/2021 05/31/20   [provider]  nateglinide (STARLIX) 120 MG tablet TAKE 1 TABLET BY MOUTH 3 TIMES A DAY BEFORE MEALS 03/06/21   Coral Spikes, DO  NON FORMULARY [facility] probiotic  Once a day    [provider]  omega-3 acid ethyl esters (LOVAZA) 1 g capsule Take 2 g by mouth daily.    [provider]  omeprazole (PRILOSEC) 20 MG capsule TAKE 1 CAPSULE BY MOUTH EVERY DAY Patient taking differently: as needed. 08/10/20   Annitta Needs, NP  OVER THE COUNTER MEDICATION 2 (two) times daily. Vitamin D     One a day  Not sure strength    [provider]  Vitamin Mixture (ESTER-C) 500-60 MG TABS Take 1 tablet by mouth daily.     [provider]    Physical Exam: BP (!) 157/69    Pulse (!) 106    Temp (!) 102 F (38.9 C) (Oral)    Resp 17    SpO2 95%   General: 88 y.o. year-old female well developed well nourished in no acute distress.  Alert and oriented x3. HEENT: NCAT, EOMI Neck: Supple, trachea medial Cardiovascular: Regular rate and rhythm with no rubs or gallops.  No thyromegaly or JVD noted.  No lower extremity edema. 2/4 pulses in all 4 extremities. Respiratory: Clear to auscultation with no wheezes or rales. Good inspiratory effort. Abdomen: Soft, tender to palpation of lower quadrants.   Nondistended with normal bowel sounds x4 quadrants. Muskuloskeletal: No cyanosis, clubbing or edema noted bilaterally Neuro: CN II-XII intact, strength 5/5 x 4, sensation, reflexes intact Skin: No ulcerative lesions noted or rashes Psychiatry: Judgement and insight appear normal. Mood is appropriate for condition and setting          Labs on Admission:  Basic Metabolic Panel: Recent Labs  Lab 03/15/21  1803  NA 133*  K 3.7  CL 101  CO2 25  GLUCOSE 171*  BUN 21  CREATININE 0.89  CALCIUM 9.0   Liver Function Tests: No results for input(s): AST, ALT, ALKPHOS, BILITOT, PROT, ALBUMIN in the last 168 hours. No results for input(s): LIPASE, AMYLASE in the last 168 hours. No results for input(s): AMMONIA in the last 168 hours. CBC: Recent Labs  Lab 03/15/21 1803  WBC 14.5*  NEUTROABS 12.3*  HGB 13.5  HCT 41.5  MCV 92.0  PLT 289   Cardiac Enzymes: No results for input(s): CKTOTAL, CKMB, CKMBINDEX, TROPONINI in the last 168 hours.  BNP (last 3 results) No results for input(s): BNP in the last 8760 hours.  ProBNP (last 3 results) No results for input(s): PROBNP in the last 8760 hours.  CBG: No results for input(s): GLUCAP in the last 168 hours.  Radiological Exams on Admission: CT ABDOMEN PELVIS W CONTRAST  Result Date: 03/15/2021 CLINICAL DATA:  Left lower quadrant pain. Patient reports fever today. EXAM: CT ABDOMEN AND PELVIS WITH CONTRAST TECHNIQUE: Multidetector CT imaging of the abdomen and pelvis was performed using the standard protocol following bolus administration of intravenous contrast. RADIATION DOSE REDUCTION: This exam was performed according to the departmental dose-optimization program which includes automated exposure control, adjustment of the mA and/or kV according to patient size and/or use of iterative reconstruction technique. CONTRAST:  129mL OMNIPAQUE IOHEXOL 300 MG/ML  SOLN COMPARISON:  11/30/2014 FINDINGS: Lower chest: Bandlike linear atelectasis in  the right left lower lobe. Mild compressive atelectasis in the right middle lobe. No pleural effusion or confluent consolidation. Hepatobiliary: Mild focal fatty infiltration adjacent to the gallbladder fossa and falciform ligament. No discrete liver lesion. Post cholecystectomy. Prominent common bile duct likely related to prior cholecystectomy. There is also mild intrahepatic biliary ductal dilatation no evidence of biliary obstruction. Pancreas: Parenchymal atrophy. No ductal dilatation or inflammation. Spleen: Normal in size without focal abnormality. Adrenals/Urinary Tract: No adrenal nodule. Moderate to advanced right hydroureteronephrosis. Ureter is dilated to a 7 mm obstructing stone in the distal ureter, series 2, image 55. The more distal ureter is decompressed. There is only mild right perinephric edema. Slight decreased right renal enhancement. Calyceal diverticulum in the upper right kidney. Additional small scattered right renal cysts. There is no left hydronephrosis. Left renal cortical and parapelvic cysts. No left perinephric edema. Decompressed left ureter. Partially distended urinary bladder. Stomach/Bowel: Detailed bowel assessment is limited in the absence of enteric contrast and paucity of intra-abdominal fat. Nondistended stomach and duodenum. No small bowel obstruction or inflammation. There is fecalization of distal small bowel contents. Cecum is high-riding in the right mid abdomen. The appendix is not confidently visualized no evidence of appendicitis. Moderate volume of stool throughout the colon. There is colonic redundancy. Diverticulosis is prominent involving the descending and sigmoid colon. No acute diverticulitis or acute colonic inflammation. Vascular/Lymphatic: Moderate aortic atherosclerosis with aortic tortuosity. No aortic aneurysm. Patent portal and splenic veins. No bulky abdominopelvic adenopathy. Reproductive: Status post hysterectomy. No adnexal masses. Other: No free  air, free fluid, or intra-abdominal fluid collection. Minimal fat in the left inguinal canal. Musculoskeletal: Mild to moderate L1 superior endplate compression fracture has sclerotic margins and is likely chronic, however is new from prior exam. Scoliosis and degenerative change in the spine. No focal bone lesion or acute osseous abnormalities. IMPRESSION: 1. Obstructing 7 mm stone in the distal right ureter with moderate to advanced right hydroureteronephrosis. 2. Colonic diverticulosis without acute inflammation. 3. Mild to moderate  L1 superior endplate compression fracture has sclerotic margins and is likely chronic, however is new from prior exam. Recommend correlation for history of back pain. Aortic Atherosclerosis (ICD10-I70.0). Electronically Signed   By: Keith Rake M.D.   On: 03/15/2021 20:53    EKG: I independently viewed the EKG done and my findings are as followed: EKG was not done in the ED  Assessment/Plan Present on Admission:  UTI (urinary tract infection)  Essential hypertension  Principal Problem:   UTI (urinary tract infection) Active Problems:   Essential hypertension   Hyperlipidemia, unspecified   Acute lower UTI   Nephrolithiasis   Hyperglycemia due to diabetes mellitus ([place])   Leukocytosis   Hyponatremia   Sepsis ([place])  Sepsis secondary to UTI POA Patient met sepsis criteria due to being febrile, tachycardic and having leukocytosis with urinary tract being source of infection. Patient was started on IV ceftriaxone, we shall continue with same at this time Continue Tylenol as needed  Urinalysis was positive for large leukocytes and > 50 WBCs Urine culture and blood culture pending  Right-sided nephrolithiasis with moderate to advanced right hydroureteronephrosis Urologist at [facility] was consulted and recommended admitting patient to [facility] with plan to possibly stent the patient on arrival to [facility]  Leukocytosis possibly due to #1 Continue treatment  as described for sepsis due to UTI  Hyponatremia Na 133, continue to monitor sodium levels  Hyperglycemia secondary to T2DM A1c on 11/29/2020 was 6.3 Continue ISS and hypoglycemic protocol  Essential hypertension Continue lisinopril  Hyperlipidemia Continue Zetia  GERD Continue Protonix  Other home meds: Creon   DVT prophylaxis: SCDs  Code Status: Full code  Family Communication: Son at bedside (all questions answered to satisfaction)  Disposition Plan:  Patient is from:                        home Anticipated DC to:                   SNF or family members home Anticipated DC date:               2-3 days Anticipated DC barriers:          Patient requires inpatient management due to right-sided nephrolithiasis pending urology consult and possible surgical intervention    Consults called: Urology (by ED team)  Admission status: Inpatient    Bernadette Hoit MD [facility]  03/15/2021, 11:24 PM

## 2021-03-15 NOTE — ED Notes (Addendum)
ED to ED transfer to [facility]  if no bed available.

## 2021-03-15 NOTE — ED Provider Notes (Signed)
[facility] EMERGENCY DEPARTMENT Provider Note   CSN: [id number] Arrival date & time: 03/15/21  1530     History  Chief Complaint  Patient presents with   Dysuria    Deborah Gray is a 88 y.o. female.   Dysuria Associated symptoms: abdominal pain   Patient presents with lower abdominal pain.  Has had for the last few days.  States temperature up to 100.1 today.  Has had some hematuria.  History of interstitial cystitis.  States she had severe lower abdominal pain.  No diarrhea or constipation.  States it feels like her interstitial cystitis but is little different.  Usually not hurting this much.  Had been on antibiotics previously.  Had recent bladder instillation.  Urinalysis by urology yesterday and culture not returned yet.  Started on antibiotics.    Home Medications Prior to Admission medications   Medication Sig Start Date End Date Taking? Authorizing Provider  acetaminophen (TYLENOL) 500 MG tablet Take 500 mg by mouth at bedtime.   Yes [provider]  aspirin EC 81 MG tablet Take 81 mg by mouth at bedtime.   Yes [provider]  Carboxymethylcellulose Sodium (THERATEARS) 0.25 % SOLN Apply 1 drop to eye 2 (two) times daily.    Yes [provider]  clidinium-chlordiazePOXIDE (LIBRAX) 5-2.5 MG capsule Take 1 capsule by mouth 4 (four) times daily as needed (for esophageal symptoms). 09/20/20  Yes Neil Crouch S, PA-C  CREON 24000-76000 units CPEP TAKE 3 CAPSULES THREE TIMES DAILY WITH MEALS AND ONE CAPSULE WITH SNACK TWICE DAILY Patient taking differently: Take 3 capsules by mouth See admin instructions. TAKE 3 CAPSULES THREE TIMES DAILY WITH MEALS AND ONE CAPSULE WITH SNACK TWICE DAILY 03/19/20  Yes Carlis Stable, NP  ezetimibe (ZETIA) 10 MG tablet TAKE 1 TABLET BY MOUTH EVERY DAY 01/16/21  Yes Strader, [place] M, PA-C  guaiFENesin (MUCINEX) 600 MG 12 hr tablet Take 1,200 mg by mouth daily.   Yes [provider]  hyoscyamine (LEVSIN SL)  0.125 MG SL tablet USE 1 TABLET UNDER THE TONGUE BEFORE MEALS AND AT BEDTIME AS NEEDED FOR FLARES IN SYMPTOMS, diarrhea/abdominal cramping 09/20/20  Yes Mahala Menghini, PA-C  ipratropium (ATROVENT) 0.06 % nasal spray Place into both nostrils. 12/09/20  Yes [provider]  ketoconazole (NIZORAL) 2 % cream Apply 1 application topically daily as needed for irritation (to affected area. external use only). 10/27/20  Yes Mahala Menghini, PA-C  lisinopril (ZESTRIL) 20 MG tablet Take 20 mg by mouth daily. 01/15/21  Yes [provider]  losartan (COZAAR) 50 MG tablet Take 1 tablet (50 mg total) by mouth daily. 09/11/20  Yes Verta Ellen., NP  meclizine (ANTIVERT) 25 MG tablet TAKE 1 TABLET BY MOUTH 3 TIMES A DAY AS NEEDED Patient taking differently: Take 25 mg by mouth 3 (three) times daily. 06/08/20  Yes Nilda Simmer, NP  metFORMIN (GLUCOPHAGE) 500 MG tablet TAKE ONE TABLET BY MOUTH EVERY MORNING ONE AT LUNCH AND 2 AT BEDTIME Patient taking differently: Take 500 mg by mouth See admin instructions. TAKE ONE TABLET BY MOUTH EVERY MORNING ONE AT LUNCH AND 2 AT BEDTIME 02/26/21  Yes Cook, Jayce G, DO  methenamine (HIPREX) 1 g tablet Take 1 tablet (1 g total) by mouth 2 (two) times daily. 06/21/20  Yes McKenzie, Candee Furbish, MD  mirabegron ER (MYRBETRIQ) 50 MG TB24 tablet Take 1 tablet (50 mg total) by mouth daily. 10/25/20  Yes McKenzie, Candee Furbish, MD  Multiple  Vitamins-Minerals (PRESERVISION AREDS 2) CAPS Take 1 capsule by mouth 2 (two) times daily.    Yes [provider]  nitrofurantoin, macrocrystal-monohydrate, (MACROBID) 100 MG capsule Take 1 capsule (100 mg total) by mouth every 12 (twelve) hours. 03/14/21  Yes McKenzie, Candee Furbish, MD  amoxicillin (AMOXIL) 500 MG capsule SMARTSIG:4 Capsule(s) By Mouth Once Patient not taking: Reported on 03/15/2021 01/04/21   [provider]  blood glucose meter kit and supplies KIT Dispense based on  insurance preference. Tests once a day  E11.9 09/25/18   [name], MD  clobetasol (TEMOVATE) 0.05 % external solution SMARTSIG:6-8 Drop(s) Topical Daily Patient not taking: Reported on 03/15/2021 05/31/20   [provider]  nateglinide (STARLIX) 120 MG tablet TAKE 1 TABLET BY MOUTH 3 TIMES A DAY BEFORE MEALS 03/06/21   Coral Spikes, DO  NON FORMULARY [facility] probiotic  Once a day    [provider]  omega-3 acid ethyl esters (LOVAZA) 1 g capsule Take 2 g by mouth daily.    [provider]  omeprazole (PRILOSEC) 20 MG capsule TAKE 1 CAPSULE BY MOUTH EVERY DAY Patient taking differently: as needed. 08/10/20   Annitta Needs, NP  OVER THE COUNTER MEDICATION 2 (two) times daily. Vitamin D     One a day  Not sure strength    [provider]  Vitamin Mixture (ESTER-C) 500-60 MG TABS Take 1 tablet by mouth daily.     [provider]      Allergies    Adhesive [tape], Estrogens, and Sulfonamide derivatives    Review of Systems   Review of Systems  Constitutional:  Negative for appetite change.  Respiratory:  Negative for shortness of breath.   Cardiovascular:  Negative for chest pain.  Gastrointestinal:  Positive for abdominal pain.  Genitourinary:  Positive for dysuria and hematuria.  Musculoskeletal:  Negative for back pain.  Neurological:  Negative for weakness.  Psychiatric/Behavioral:  Negative for confusion.    Physical Exam Updated Vital Signs BP (!) 158/66    Pulse 100    Temp (!) 102 F (38.9 C) (Oral)    Resp 16    SpO2 97%  Physical Exam Vitals and nursing note reviewed.  HENT:     Head: Atraumatic.  Eyes:     Pupils: Pupils are equal, round, and reactive to light.  Cardiovascular:     Rate and Rhythm: Regular rhythm.  Pulmonary:     Breath sounds: No rhonchi.  Abdominal:     Tenderness: There is abdominal tenderness.     Comments: Mild suprapubic tenderness out rebound or guarding.  No hernia palpated  Musculoskeletal:        General: No tenderness.  Skin:     General: Skin is warm.     Capillary Refill: Capillary refill takes less than 2 seconds.  Neurological:     Mental Status: She is alert and oriented to person, place, and time.    ED Results / Procedures / Treatments   Labs (all labs ordered are listed, but only abnormal results are displayed) Labs Reviewed  URINALYSIS, ROUTINE W REFLEX MICROSCOPIC - Abnormal; Notable for the following components:      Result Value   APPearance HAZY (*)    Protein, ur 30 (*)    Leukocytes,Ua LARGE (*)    WBC, UA >50 (*)    Bacteria, UA RARE (*)    All other components within normal limits  BASIC METABOLIC PANEL - Abnormal; Notable for the following  components:   Sodium 133 (*)    Glucose, Bld 171 (*)    All other components within normal limits  CBC WITH DIFFERENTIAL/PLATELET - Abnormal; Notable for the following components:   WBC 14.5 (*)    Neutro Abs 12.3 (*)    Monocytes Absolute 1.2 (*)    Abs Immature Granulocytes 0.08 (*)    All other components within normal limits  CULTURE, BLOOD (ROUTINE X 2)  CULTURE, BLOOD (ROUTINE X 2)  RESP PANEL BY RT-PCR (FLU A&B, COVID) ARPGX2  LACTIC ACID, PLASMA  LACTIC ACID, PLASMA    EKG None  Radiology CT ABDOMEN PELVIS W CONTRAST  Result Date: 03/15/2021 CLINICAL DATA:  Left lower quadrant pain. Patient reports fever today. EXAM: CT ABDOMEN AND PELVIS WITH CONTRAST TECHNIQUE: Multidetector CT imaging of the abdomen and pelvis was performed using the standard protocol following bolus administration of intravenous contrast. RADIATION DOSE REDUCTION: This exam was performed according to the departmental dose-optimization program which includes automated exposure control, adjustment of the mA and/or kV according to patient size and/or use of iterative reconstruction technique. CONTRAST:  183mL OMNIPAQUE IOHEXOL 300 MG/ML  SOLN COMPARISON:  11/30/2014 FINDINGS: Lower chest: Bandlike linear atelectasis in the right left lower lobe. Mild compressive  atelectasis in the right middle lobe. No pleural effusion or confluent consolidation. Hepatobiliary: Mild focal fatty infiltration adjacent to the gallbladder fossa and falciform ligament. No discrete liver lesion. Post cholecystectomy. Prominent common bile duct likely related to prior cholecystectomy. There is also mild intrahepatic biliary ductal dilatation no evidence of biliary obstruction. Pancreas: Parenchymal atrophy. No ductal dilatation or inflammation. Spleen: Normal in size without focal abnormality. Adrenals/Urinary Tract: No adrenal nodule. Moderate to advanced right hydroureteronephrosis. Ureter is dilated to a 7 mm obstructing stone in the distal ureter, series 2, image 55. The more distal ureter is decompressed. There is only mild right perinephric edema. Slight decreased right renal enhancement. Calyceal diverticulum in the upper right kidney. Additional small scattered right renal cysts. There is no left hydronephrosis. Left renal cortical and parapelvic cysts. No left perinephric edema. Decompressed left ureter. Partially distended urinary bladder. Stomach/Bowel: Detailed bowel assessment is limited in the absence of enteric contrast and paucity of intra-abdominal fat. Nondistended stomach and duodenum. No small bowel obstruction or inflammation. There is fecalization of distal small bowel contents. Cecum is high-riding in the right mid abdomen. The appendix is not confidently visualized no evidence of appendicitis. Moderate volume of stool throughout the colon. There is colonic redundancy. Diverticulosis is prominent involving the descending and sigmoid colon. No acute diverticulitis or acute colonic inflammation. Vascular/Lymphatic: Moderate aortic atherosclerosis with aortic tortuosity. No aortic aneurysm. Patent portal and splenic veins. No bulky abdominopelvic adenopathy. Reproductive: Status post hysterectomy. No adnexal masses. Other: No free air, free fluid, or intra-abdominal fluid  collection. Minimal fat in the left inguinal canal. Musculoskeletal: Mild to moderate L1 superior endplate compression fracture has sclerotic margins and is likely chronic, however is new from prior exam. Scoliosis and degenerative change in the spine. No focal bone lesion or acute osseous abnormalities. IMPRESSION: 1. Obstructing 7 mm stone in the distal right ureter with moderate to advanced right hydroureteronephrosis. 2. Colonic diverticulosis without acute inflammation. 3. Mild to moderate L1 superior endplate compression fracture has sclerotic margins and is likely chronic, however is new from prior exam. Recommend correlation for history of back pain. Aortic Atherosclerosis (ICD10-I70.0). Electronically Signed   By: Keith Rake M.D.   On: 03/15/2021 20:53    Procedures Procedures  Medications Ordered in ED Medications  cefTRIAXone (ROCEPHIN) 1 g in sodium chloride 0.9 % 100 mL IVPB (1 g Intravenous New Bag/Given 03/15/21 2104)  iohexol (OMNIPAQUE) 300 MG/ML solution 100 mL (100 mLs Intravenous Contrast Given 03/15/21 2026)  fentaNYL (SUBLIMAZE) injection 50 mcg (50 mcg Intravenous Given 03/15/21 2108)    ED Course/ Medical Decision Making/ A&P                           Medical Decision Making Amount and/or Complexity of Data Reviewed External Data Reviewed: notes.    Details: urology notes Labs: ordered. Decision-making details documented in ED Course. Radiology: ordered and independent interpretation performed. Decision-making details documented in ED Course.  Risk Prescription drug management. Decision regarding hospitalization.   Patient with dysuria and hematuria.  History of interstitial cystitis.  Recently had infusion around 2 weeks ago.  However developed urinary symptoms.  Lower abdominal pain.  Also history of diverticulitis.  Has developed a fever while in the ER.  Blood cultures added.  Normal lactic acid.  Temperature up to 102 however.  White count elevated at 14.   CT scan done due to lower abdominal pain and showed obstructed left-sided kidney stone.  I independently interpreted the CT.  However this puts her with an infected obstructed stone.  Culture from yesterday in urology office not resulted yet.  Started on Rocephin. Discussed with urology.  Dr. Abner Greenspan will likely need to stent her.  Does not appear septic this time would need to be done urgently.  Do not have a capacity to do that up here tonight.  Will transfer to [facility].  However will require admission to the medical team.  Medicine can put in the admission but if there is a delay for the bed will need transfer to the ER.  CRITICAL CARE Performed by: Davonna Belling Total critical care time: 30 minutes Critical care time was exclusive of separately billable procedures and treating other patients. Critical care was necessary to treat or prevent imminent or life-threatening deterioration. Critical care was time spent personally by me on the following activities: development of treatment plan with patient and/or surrogate as well as nursing, discussions with consultants, evaluation of patient's response to treatment, examination of patient, obtaining history from patient or surrogate, ordering and performing treatments and interventions, ordering and review of laboratory studies, ordering and review of radiographic studies, pulse oximetry and re-evaluation of patient's condition.         Final Clinical Impression(s) / ED Diagnoses Final diagnoses:  Kidney stone  Urinary tract infection without hematuria, site unspecified    Rx / DC Orders ED Discharge Orders     None         Davonna Belling, MD 03/15/21 2123

## 2021-03-15 NOTE — ED Triage Notes (Signed)
Lower abdominal pain , history of  urinary issues, states she had a fever today

## 2021-03-16 ENCOUNTER — Encounter (HOSPITAL_COMMUNITY): Payer: Self-pay | Admitting: Urology

## 2021-03-16 ENCOUNTER — Inpatient Hospital Stay (HOSPITAL_COMMUNITY): Payer: Medicare Other | Admitting: Certified Registered Nurse Anesthetist

## 2021-03-16 ENCOUNTER — Inpatient Hospital Stay (HOSPITAL_COMMUNITY): Payer: Medicare Other

## 2021-03-16 ENCOUNTER — Other Ambulatory Visit: Payer: Self-pay

## 2021-03-16 DIAGNOSIS — I10 Essential (primary) hypertension: Secondary | ICD-10-CM | POA: Diagnosis not present

## 2021-03-16 DIAGNOSIS — A419 Sepsis, unspecified organism: Principal | ICD-10-CM

## 2021-03-16 DIAGNOSIS — N200 Calculus of kidney: Secondary | ICD-10-CM | POA: Diagnosis not present

## 2021-03-16 DIAGNOSIS — E1165 Type 2 diabetes mellitus with hyperglycemia: Secondary | ICD-10-CM | POA: Diagnosis not present

## 2021-03-16 DIAGNOSIS — N390 Urinary tract infection, site not specified: Secondary | ICD-10-CM | POA: Diagnosis not present

## 2021-03-16 LAB — GLUCOSE, CAPILLARY
Glucose-Capillary: 128 mg/dL — ABNORMAL HIGH (ref 70–99)
Glucose-Capillary: 163 mg/dL — ABNORMAL HIGH (ref 70–99)
Glucose-Capillary: 167 mg/dL — ABNORMAL HIGH (ref 70–99)
Glucose-Capillary: 187 mg/dL — ABNORMAL HIGH (ref 70–99)
Glucose-Capillary: 228 mg/dL — ABNORMAL HIGH (ref 70–99)
Glucose-Capillary: 229 mg/dL — ABNORMAL HIGH (ref 70–99)

## 2021-03-16 LAB — COMPREHENSIVE METABOLIC PANEL
ALT: 13 U/L (ref 0–44)
AST: 10 U/L — ABNORMAL LOW (ref 15–41)
Albumin: 3.4 g/dL — ABNORMAL LOW (ref 3.5–5.0)
Alkaline Phosphatase: 52 U/L (ref 38–126)
Anion gap: 9 (ref 5–15)
BUN: 19 mg/dL (ref 8–23)
CO2: 25 mmol/L (ref 22–32)
Calcium: 8.9 mg/dL (ref 8.9–10.3)
Chloride: 101 mmol/L (ref 98–111)
Creatinine, Ser: 0.7 mg/dL (ref 0.44–1.00)
GFR, Estimated: >60 mL/min (ref >60)
Glucose, Bld: 182 mg/dL — ABNORMAL HIGH (ref 70–99)
Potassium: 3.3 mmol/L — ABNORMAL LOW (ref 3.5–5.1)
Sodium: 135 mmol/L (ref 135–145)
Total Bilirubin: 0.5 mg/dL (ref 0.3–1.2)
Total Protein: 6.8 g/dL (ref 6.5–8.1)

## 2021-03-16 LAB — CBG MONITORING, ED: Glucose-Capillary: 172 mg/dL — ABNORMAL HIGH (ref 70–99)

## 2021-03-16 LAB — CBC
HCT: 42.1 % (ref 36.0–46.0)
Hemoglobin: 13.3 g/dL (ref 12.0–15.0)
MCH: 28.7 pg (ref 26.0–34.0)
MCHC: 31.6 g/dL (ref 30.0–36.0)
MCV: 90.9 fL (ref 80.0–100.0)
Platelets: 247 10*3/uL (ref 150–400)
RBC: 4.63 MIL/uL (ref 3.87–5.11)
RDW: 14 % (ref 11.5–15.5)
WBC: 13.6 10*3/uL — ABNORMAL HIGH (ref 4.0–10.5)
nRBC: 0 % (ref 0.0–0.2)

## 2021-03-16 LAB — PHOSPHORUS: Phosphorus: 2.7 mg/dL (ref 2.5–4.6)

## 2021-03-16 LAB — MAGNESIUM: Magnesium: 1.8 mg/dL (ref 1.7–2.4)

## 2021-03-16 LAB — APTT: aPTT: 26 seconds (ref 24–36)

## 2021-03-16 MED ORDER — EPHEDRINE SULFATE-NACL 50-0.9 MG/10ML-% IV SOSY
PREFILLED_SYRINGE | INTRAVENOUS | Status: DC | PRN
  Administered 2021-03-16 (×2): 5 mg via INTRAVENOUS

## 2021-03-16 MED ORDER — FENTANYL CITRATE PF 50 MCG/ML IJ SOSY: 25.0000 ug | PREFILLED_SYRINGE | INTRAMUSCULAR | Status: DC | PRN

## 2021-03-16 MED ORDER — PHENYLEPHRINE 40 MCG/ML (10ML) SYRINGE FOR IV PUSH (FOR BLOOD PRESSURE SUPPORT)
PREFILLED_SYRINGE | INTRAVENOUS | Status: AC
  Filled 2021-03-16: qty 10

## 2021-03-16 MED ORDER — EZETIMIBE 10 MG PO TABS
10.0000 mg | ORAL_TABLET | Freq: Every day | ORAL | Status: DC
Start: 2021-03-16 — End: 2021-03-17
  Administered 2021-03-16 – 2021-03-17 (×2): 10 mg via ORAL
  Filled 2021-03-16 (×2): qty 1

## 2021-03-16 MED ORDER — ONDANSETRON HCL 4 MG/2ML IJ SOLN
INTRAMUSCULAR | Status: DC | PRN
  Administered 2021-03-16: 4 mg via INTRAVENOUS

## 2021-03-16 MED ORDER — LIDOCAINE 2% (20 MG/ML) 5 ML SYRINGE
INTRAMUSCULAR | Status: DC | PRN
  Administered 2021-03-16: 40 mg via INTRAVENOUS

## 2021-03-16 MED ORDER — DEXAMETHASONE SODIUM PHOSPHATE 10 MG/ML IJ SOLN
INTRAMUSCULAR | Status: DC | PRN
  Administered 2021-03-16: 4 mg via INTRAVENOUS

## 2021-03-16 MED ORDER — EPHEDRINE 5 MG/ML INJ
INTRAVENOUS | Status: AC
  Filled 2021-03-16: qty 5

## 2021-03-16 MED ORDER — OXYCODONE HCL 5 MG PO TABS
ORAL_TABLET | ORAL | Status: AC
  Filled 2021-03-16: qty 1

## 2021-03-16 MED ORDER — LIDOCAINE HCL (PF) 2 % IJ SOLN
INTRAMUSCULAR | Status: AC
  Filled 2021-03-16: qty 5

## 2021-03-16 MED ORDER — ONDANSETRON HCL 4 MG/2ML IJ SOLN: 4.0000 mg | Freq: Once | INTRAMUSCULAR | Status: DC | PRN

## 2021-03-16 MED ORDER — PANCRELIPASE (LIP-PROT-AMYL) 36000-114000 UNITS PO CPEP
72000.0000 [IU] | ORAL_CAPSULE | Freq: Three times a day (TID) | ORAL | Status: DC
  Administered 2021-03-16 – 2021-03-17 (×5): 72000 [IU] via ORAL
  Filled 2021-03-16 (×3): qty 2
  Filled 2021-03-16: qty 6
  Filled 2021-03-16 (×3): qty 2

## 2021-03-16 MED ORDER — POTASSIUM CHLORIDE 10 MEQ/100ML IV SOLN
10.0000 meq | INTRAVENOUS | Status: AC
  Administered 2021-03-16 (×3): 10 meq via INTRAVENOUS
  Filled 2021-03-16 (×4): qty 100

## 2021-03-16 MED ORDER — PANTOPRAZOLE SODIUM 40 MG PO TBEC
40.0000 mg | DELAYED_RELEASE_TABLET | Freq: Every day | ORAL | Status: DC
  Administered 2021-03-16 – 2021-03-17 (×2): 40 mg via ORAL
  Filled 2021-03-16 (×2): qty 1

## 2021-03-16 MED ORDER — PROPOFOL 10 MG/ML IV BOLUS
INTRAVENOUS | Status: DC | PRN
  Administered 2021-03-16: 100 mg via INTRAVENOUS

## 2021-03-16 MED ORDER — PHENYLEPHRINE 40 MCG/ML (10ML) SYRINGE FOR IV PUSH (FOR BLOOD PRESSURE SUPPORT)
PREFILLED_SYRINGE | INTRAVENOUS | Status: DC | PRN
  Administered 2021-03-16: 160 ug via INTRAVENOUS
  Administered 2021-03-16: 120 ug via INTRAVENOUS
  Administered 2021-03-16: 80 ug via INTRAVENOUS
  Administered 2021-03-16: 120 ug via INTRAVENOUS
  Administered 2021-03-16: 200 ug via INTRAVENOUS

## 2021-03-16 MED ORDER — SODIUM CHLORIDE 0.9 % IR SOLN
Status: DC | PRN
  Administered 2021-03-16: 3000 mL

## 2021-03-16 MED ORDER — ONDANSETRON HCL 4 MG/2ML IJ SOLN
INTRAMUSCULAR | Status: AC
  Filled 2021-03-16: qty 2

## 2021-03-16 MED ORDER — PANCRELIPASE (LIP-PROT-AMYL) 24000-76000 UNITS PO CPEP
3.0000 | ORAL_CAPSULE | ORAL | Status: DC
Start: 2021-03-16 — End: 2021-03-16

## 2021-03-16 MED ORDER — LISINOPRIL 20 MG PO TABS
20.0000 mg | ORAL_TABLET | Freq: Every day | ORAL | Status: DC
  Administered 2021-03-16: 20 mg via ORAL
  Filled 2021-03-16: qty 1

## 2021-03-16 MED ORDER — OXYCODONE HCL 5 MG PO TABS
5.0000 mg | ORAL_TABLET | Freq: Once | ORAL | Status: AC | PRN
  Administered 2021-03-16: 5 mg via ORAL

## 2021-03-16 MED ORDER — DEXAMETHASONE SODIUM PHOSPHATE 10 MG/ML IJ SOLN
INTRAMUSCULAR | Status: AC
  Filled 2021-03-16: qty 1

## 2021-03-16 MED ORDER — LACTATED RINGERS IV SOLN: INTRAVENOUS | Status: DC | PRN

## 2021-03-16 MED ORDER — IOHEXOL 300 MG/ML  SOLN
INTRAMUSCULAR | Status: DC | PRN
  Administered 2021-03-16: 2 mL

## 2021-03-16 MED ORDER — OXYCODONE HCL 5 MG/5ML PO SOLN: 5.0000 mg | Freq: Once | ORAL | Status: AC | PRN

## 2021-03-16 MED ORDER — PROPOFOL 10 MG/ML IV BOLUS
INTRAVENOUS | Status: AC
  Filled 2021-03-16: qty 20

## 2021-03-16 MED ORDER — POTASSIUM CHLORIDE 10 MEQ/100ML IV SOLN
10.0000 meq | Freq: Once | INTRAVENOUS | Status: AC
  Administered 2021-03-16: 10 meq via INTRAVENOUS

## 2021-03-16 MED ORDER — CHLORHEXIDINE GLUCONATE CLOTH 2 % EX PADS
6.0000 | MEDICATED_PAD | Freq: Every day | CUTANEOUS | Status: DC
  Administered 2021-03-16 – 2021-03-17 (×2): 6 via TOPICAL

## 2021-03-16 MED ORDER — STERILE WATER FOR IRRIGATION IR SOLN
Status: DC | PRN
  Administered 2021-03-16: 1000 mL

## 2021-03-16 NOTE — Anesthesia Postprocedure Evaluation (Signed)
Anesthesia Post Note  Patient: Deborah Gray  Procedure(s) Performed: CYSTOSCOPY WITH RETROGRADE PYELOGRAM/URETERAL STENT PLACEMENT (Right: Perineum)     Patient location during evaluation: PACU Anesthesia Type: General Level of consciousness: awake and alert Pain management: pain level controlled Vital Signs Assessment: post-procedure vital signs reviewed and stable Respiratory status: spontaneous breathing, nonlabored ventilation, respiratory function stable and patient connected to nasal cannula oxygen Cardiovascular status: blood pressure returned to baseline and stable Postop Assessment: no apparent nausea or vomiting Anesthetic complications: no   No notable events documented.  Last Vitals:  Vitals:   03/16/21 0330 03/16/21 0445  BP: (!) 125/55 (!) 124/53  Pulse: 91 91  Resp: (!) 21 20  Temp: 36.8 C 36.4 C  SpO2: 95% 96%    Last Pain:  Vitals:   03/16/21 0445  TempSrc:   PainSc: [place]

## 2021-03-16 NOTE — Anesthesia Procedure Notes (Signed)
Procedure Name: LMA Insertion Date/Time: 03/16/2021 4:09 AM Performed by: Rosaland Lao, CRNA Pre-anesthesia Checklist: Patient identified, Emergency Drugs available, Suction available and Patient being monitored Patient Re-evaluated:Patient Re-evaluated prior to induction Oxygen Delivery Method: Circle system utilized Preoxygenation: Pre-oxygenation with 100% oxygen Induction Type: IV induction LMA: LMA inserted LMA Size: 4.0 Number of attempts: 1 Placement Confirmation: positive ETCO2 and breath sounds checked- equal and bilateral Tube secured with: Tape Dental Injury: Teeth and Oropharynx as per pre-operative assessment

## 2021-03-16 NOTE — Transfer of Care (Signed)
Immediate Anesthesia Transfer of Care Note  Patient: [name]  Procedure(s) Performed: CYSTOSCOPY WITH RETROGRADE PYELOGRAM/URETERAL STENT PLACEMENT (Right: Perineum)  Patient Location: PACU  Anesthesia Type:General  Level of Consciousness: awake, alert  and oriented  Airway & Oxygen Therapy: Patient Spontanous Breathing  Post-op Assessment: Report given to RN and Post -op Vital signs reviewed and stable  Post vital signs: Reviewed and stable  Last Vitals:  Vitals Value Taken Time  BP    Temp    Pulse 89 03/16/21 0455  Resp 17 03/16/21 0455  SpO2 94 % 03/16/21 0455  Vitals shown include unvalidated device data.  Last Pain:  Vitals:   03/16/21 0330  TempSrc:   PainSc: 0-No pain         Complications: No notable events documented.

## 2021-03-16 NOTE — Progress Notes (Signed)
Inpatient Diabetes Program Recommendations  AACE/ADA: New Consensus Statement on Inpatient Glycemic Control (2015)  Target Ranges:  Prepandial:   less than 140 mg/dL      Peak postprandial:   less than 180 mg/dL (1-2 hours)      Critically ill patients:  140 - 180 mg/dL   Lab Results  Component Value Date   GLUCAP 229 (H) 03/16/2021   HGBA1C 6.3 (H) 11/29/2020    Review of Glycemic Control  Latest Reference Range & Units 03/16/21 07:37 03/16/21 12:04  Glucose-Capillary 70 - 99 mg/dL 187 (H) 229 (H)  (H): Data is abnormally high  Diabetes history: DM2 Outpatient Diabetes medications: Metformin 500 mg with breakfast and luncg and 1000 mg with supper Current orders for Inpatient glycemic control: Novolog 0-9 Q4H  Inpatient Diabetes Program Recommendations:    1) Novolog 0-9 units TID and 0-5 QHS 2) If postprandials consistently elevated consider,  Novolog 2 units TID with meals if eats at least 50%  Will continue to follow while inpatient.  Thank you, Reche Dixon, MSN, RN Diabetes Coordinator Inpatient Diabetes Program [phone] (team pager from 8a-5p)

## 2021-03-16 NOTE — Plan of Care (Signed)

## 2021-03-16 NOTE — Anesthesia Preprocedure Evaluation (Addendum)
Anesthesia Evaluation  Patient identified by MRN, date of birth, ID band Patient awake    Reviewed: Allergy & Precautions, NPO status , Patient's Chart, lab work & pertinent test results  History of Anesthesia Complications (+) PONV and history of anesthetic complications  Airway Mallampati: II  TM Distance: >3 FB Neck ROM: Full    Dental  (+) Dental Advisory Given, Partial Upper   Pulmonary neg pulmonary ROS,    Pulmonary exam normal        Cardiovascular hypertension, Pt. on medications Normal cardiovascular exam+ dysrhythmias Supra Ventricular Tachycardia    '22 TTE - EF 60 to 65%. Mild left ventricular hypertrophy.  Grade I diastolic dysfunction (impaired relaxation). Elevated left atrial pressure. Left atrial size was moderately dilated. Trivial MR.    Neuro/Psych negative neurological ROS  negative psych ROS   GI/Hepatic Neg liver ROS, GERD  Medicated and Controlled, IBS    Endo/Other  diabetes, Type 2, Oral Hypoglycemic Agents  Renal/GU      Musculoskeletal  (+) Arthritis ,   Abdominal   Peds  Hematology negative hematology ROS (+)   Anesthesia Other Findings   Reproductive/Obstetrics                            Anesthesia Physical Anesthesia Plan  ASA: 3 and emergent  Anesthesia Plan: General   Post-op Pain Management:    Induction: Intravenous  PONV Risk Score and Plan: 4 or greater and Treatment may vary due to age or medical condition, Ondansetron and Propofol infusion  Airway Management Planned: LMA  Additional Equipment: None  Intra-op Plan:   Post-operative Plan: Extubation in OR  Informed Consent: I have reviewed the patients History and Physical, chart, labs and discussed the procedure including the risks, benefits and alternatives for the proposed anesthesia with the patient or authorized representative who has indicated his/her understanding and  acceptance.     Dental advisory given  Plan Discussed with: CRNA and Anesthesiologist  Anesthesia Plan Comments:        Anesthesia Quick Evaluation

## 2021-03-16 NOTE — Consult Note (Signed)
Urology Consult   Physician requesting consult: Bernadette Hoit, DO  Reason for consult: Right ureteral stone  History of Present Illness: Deborah Gray is a 88 y.o. with a medical history significant for hypertension, hyperlipidemia, diabetes who presented to the emergency department with several days of lower abdominal pain.  She endorsed history of gross hematuria over the past 5 weeks.  She also complains of dysuria over the past several days.  She was found to be febrile at 102F.   CT A/P demonstrated 7 mm stone in the distal right ureter with severe right hydroureteronephrosis.  She was found to be febrile 102F, slightly tachycardic.  She was found to have a leukocytosis.  Her lactic acid was 1.5.  She was given IV ceftriaxone.  She has a previous urologic history of interstitial cystitis.  She did present to urology in [place] and 01/2021 with gross hematuria.  She underwent cystoscopy in 02/2021 demonstrating a 2 mm right lateral wall ulcer however no suspicious tumors.  No imaging was performed at that time.  She denies a prior history of urolithiasis.  Past Medical History:  Diagnosis Date   Arthritis    Benign neoplasm of colon    Constipation    Diaphragmatic hernia without mention of obstruction or gangrene    Diverticulosis of colon (without mention of hemorrhage)    Dysphagia, pharyngoesophageal phase    Esophageal reflux    Essential hypertension    Heart murmur    Hemorrhoids    Hyperlipidemia    Internal hemorrhoids without mention of complication    Left wrist fracture    PONV (postoperative nausea and vomiting)    Stricture and stenosis of esophagus    Type 2 diabetes mellitus (HCC)    Unspecified gastritis and gastroduodenitis without mention of hemorrhage     Past Surgical History:  Procedure Laterality Date   ABDOMINAL HYSTERECTOMY     BACTERIAL OVERGROWTH TEST N/A 09/06/2015   Procedure: BACTERIAL OVERGROWTH TEST;  Surgeon: Daneil Dolin, MD;   [facility]: [facility];  Service: Endoscopy;  Laterality: N/A;  800   BREAST LUMPECTOMY Right    benign   CATARACT EXTRACTION Bilateral 2006   Implants in both, surgeries done 6 weeks apart   CHOLECYSTECTOMY     COLONOSCOPY  03/08/02   [place]: diverticulosis, internal hemorrhoids, adenomatous colon polyp   COLONOSCOPY  01/23/04   [name]: diverticulosis, internal hemorrhoids   COLONOSCOPY  09/25/05   [name]: diverticulosis   COLONOSCOPY  07/05/09   [name]: severe diverticulosis   COLONOSCOPY  01/21/11   [name]: severe diverticulosis in sigmoid to desc colon, int hemorrhoids, follow up TCS in 5 years   COLONOSCOPY N/A 04/10/2016   [name]: diverticulosis   DILATION AND CURETTAGE OF UTERUS     ESOPHAGEAL MANOMETRY  03/21/08   [name]: findings c/w [place] Esophagus   ESOPHAGOGASTRODUODENOSCOPY  03/08/02   [name]: esophageal stricture, chronic gerd, s/p dilation.    ESOPHAGOGASTRODUODENOSCOPY  01/23/04   [name]: esophageal stricture, gastritis, hiatal hernia   ESOPHAGOGASTRODUODENOSCOPY  09/25/05   [name]: gastritis, benign bx, no H.pylori   ESOPHAGOGASTRODUODENOSCOPY  07/05/09   [name]: gastropathy, benign small bowel bx and gastric bx   ESOPHAGOGASTRODUODENOSCOPY N/A 05/15/2015   Dr. Gala Romney- diffuse moderate inflammation characterized by congestion (edema), erythema, and linear erosions was found in the entire examined stomach. bx= reactive gastropathy   FOOT SURGERY Left    HEMORRHOID BANDING  2017   Dr.[name]   LAPAROSCOPIC VAGINAL HYSTERECTOMY     ORIF WRIST FRACTURE  Left 06/23/2018   Procedure: OPEN REDUCTION INTERNAL FIXATION (ORIF) LEFT WRIST FRACTURE;  Surgeon: Renette Butters, MD;  Location: [place];  Service: Orthopedics;  Laterality: Left;  regional arm block   RECTOCELE REPAIR     TOTAL KNEE ARTHROPLASTY Right      Current Hospital Medications:  Home meds:  No current facility-administered medications on file prior to encounter.    Current Outpatient Medications on File Prior to Encounter  Medication Sig Dispense Refill   acetaminophen (TYLENOL) 500 MG tablet Take 500 mg by mouth at bedtime.     aspirin EC 81 MG tablet Take 81 mg by mouth at bedtime.     Carboxymethylcellulose Sodium (THERATEARS) 0.25 % SOLN Apply 1 drop to eye 2 (two) times daily.      clidinium-chlordiazePOXIDE (LIBRAX) 5-2.5 MG capsule Take 1 capsule by mouth 4 (four) times daily as needed (for esophageal symptoms). 90 capsule 3   CREON 24000-76000 units CPEP TAKE 3 CAPSULES THREE TIMES DAILY WITH MEALS AND ONE CAPSULE WITH SNACK TWICE DAILY (Patient taking differently: Take 3 capsules by mouth See admin instructions. TAKE 3 CAPSULES THREE TIMES DAILY WITH MEALS AND ONE CAPSULE WITH SNACK TWICE DAILY) 990 capsule 2   ezetimibe (ZETIA) 10 MG tablet TAKE 1 TABLET BY MOUTH EVERY DAY 90 tablet 1   guaiFENesin (MUCINEX) 600 MG 12 hr tablet Take 1,200 mg by mouth daily.     hyoscyamine (LEVSIN SL) 0.125 MG SL tablet USE 1 TABLET UNDER THE TONGUE BEFORE MEALS AND AT BEDTIME AS NEEDED FOR FLARES IN SYMPTOMS, diarrhea/abdominal cramping 90 tablet 3   ipratropium (ATROVENT) 0.06 % nasal spray Place into both nostrils.     ketoconazole (NIZORAL) 2 % cream Apply 1 application topically daily as needed for irritation (to affected area. external use only). 30 g 0   lisinopril (ZESTRIL) 20 MG tablet Take 20 mg by mouth daily.     losartan (COZAAR) 50 MG tablet Take 1 tablet (50 mg total) by mouth daily. 90 tablet 3   meclizine (ANTIVERT) 25 MG tablet TAKE 1 TABLET BY MOUTH 3 TIMES A DAY AS NEEDED (Patient taking differently: Take 25 mg by mouth 3 (three) times daily.) 30 tablet 1   metFORMIN (GLUCOPHAGE) 500 MG tablet TAKE ONE TABLET BY MOUTH EVERY MORNING ONE AT LUNCH AND 2 AT BEDTIME (Patient taking differently: Take 500 mg by mouth See admin instructions. TAKE ONE TABLET BY MOUTH EVERY MORNING ONE AT LUNCH AND 2 AT BEDTIME) 360 tablet 0   methenamine (HIPREX) 1 g  tablet Take 1 tablet (1 g total) by mouth 2 (two) times daily. 60 tablet 11   mirabegron ER (MYRBETRIQ) 50 MG TB24 tablet Take 1 tablet (50 mg total) by mouth daily. 90 tablet 3   Multiple Vitamins-Minerals (PRESERVISION AREDS 2) CAPS Take 1 capsule by mouth 2 (two) times daily.      nitrofurantoin, macrocrystal-monohydrate, (MACROBID) 100 MG capsule Take 1 capsule (100 mg total) by mouth every 12 (twelve) hours. 14 capsule 0   amoxicillin (AMOXIL) 500 MG capsule SMARTSIG:4 Capsule(s) By Mouth Once (Patient not taking: Reported on 03/15/2021)     blood glucose meter kit and supplies KIT Dispense based on  insurance preference. Tests once a day E11.9 1 each 0   clobetasol (TEMOVATE) 0.05 % external solution SMARTSIG:6-8 Drop(s) Topical Daily (Patient not taking: Reported on 03/15/2021)     nateglinide (STARLIX) 120 MG tablet TAKE 1 TABLET BY MOUTH 3 TIMES A DAY BEFORE MEALS 270 tablet  0   NON FORMULARY Phillips probiotic  Once a day     omega-3 acid ethyl esters (LOVAZA) 1 g capsule Take 2 g by mouth daily.     omeprazole (PRILOSEC) 20 MG capsule TAKE 1 CAPSULE BY MOUTH EVERY DAY (Patient taking differently: as needed.) 90 capsule 3   OVER THE COUNTER MEDICATION 2 (two) times daily. Vitamin D     One a day  Not sure strength     Vitamin Mixture (ESTER-C) 500-60 MG TABS Take 1 tablet by mouth daily.        Scheduled Meds:  insulin aspart  0-9 Units Subcutaneous Q4H   Continuous Infusions:  cefTRIAXone (ROCEPHIN)  IV     PRN Meds:.acetaminophen, morphine injection  Allergies:  Allergies  Allergen Reactions   Adhesive [Tape] Other (See Comments)    Reaction:  Blisters    Estrogens Other (See Comments)    Reaction:  Migraines    Sulfonamide Derivatives Rash    Family History  Problem Relation Age of Onset   Ovarian cancer Mother    Diabetes Father    Heart disease Father    Breast cancer Sister    Liver cancer Sister    Colon cancer Cousin        Paternal side   Diabetes Maternal  Aunt    Liver disease Cousin        Maternal side, never drank    Social History:  reports that she has never smoked. She has never used smokeless tobacco. She reports that she does not drink alcohol and does not use drugs.  ROS: A complete review of systems was performed.  All systems are negative except for pertinent findings as noted.  Physical Exam:  Vital signs in last 24 hours: Temp:  [99.2 F (37.3 C)-102 F (38.9 C)] 99.2 F (37.3 C) (01/27 0229) Pulse Rate:  [97-106] 102 (01/27 0229) Resp:  [16-18] 16 (01/27 0229) BP: (129-158)/(66-69) 129/68 (01/27 0229) SpO2:  [95 %-100 %] 95 % (01/27 0229) Weight:  [62.1 kg] 62.1 kg (01/27 0229) Constitutional:  Alert and oriented, No acute distress Cardiovascular: Regular rate and rhythm Respiratory: Normal respiratory effort, Lungs clear bilaterally GI: Abdomen is soft, nontender, nondistended, no abdominal masses GU: No CVA tenderness Neurologic: Grossly intact, no focal deficits Psychiatric: Normal mood and affect  Laboratory Data:  Recent Labs    03/15/21 1803  WBC 14.5*  HGB 13.5  HCT 41.5  PLT 289    Recent Labs    03/15/21 1803  NA 133*  K 3.7  CL 101  GLUCOSE 171*  BUN 21  CALCIUM 9.0  CREATININE 0.89     Results for orders placed or performed during the hospital encounter of 03/15/21 (from the past 24 hour(s))  Urinalysis, Routine w reflex microscopic Urine, Clean Catch     Status: Abnormal   Collection Time: 03/15/21  5:40 PM  Result Value Ref Range   Color, Urine YELLOW YELLOW   APPearance HAZY (A) CLEAR   Specific Gravity, Urine 1.015 1.005 - 1.030   pH 5.0 5.0 - 8.0   Glucose, UA NEGATIVE NEGATIVE mg/dL   Hgb urine dipstick NEGATIVE NEGATIVE   Bilirubin Urine NEGATIVE NEGATIVE   Ketones, ur NEGATIVE NEGATIVE mg/dL   Protein, ur 30 (A) NEGATIVE mg/dL   Nitrite NEGATIVE NEGATIVE   Leukocytes,Ua LARGE (A) NEGATIVE   RBC / HPF 21-50 0 - 5 RBC/hpf   WBC, UA >50 (H) 0 - 5 WBC/hpf   Bacteria, UA  RARE (  A) NONE SEEN   Squamous Epithelial / LPF 6-10 0 - 5   WBC Clumps PRESENT    Mucus PRESENT   Basic metabolic panel     Status: Abnormal   Collection Time: 03/15/21  6:03 PM  Result Value Ref Range   Sodium 133 (L) 135 - 145 mmol/L   Potassium 3.7 3.5 - 5.1 mmol/L   Chloride 101 98 - 111 mmol/L   CO2 25 22 - 32 mmol/L   Glucose, Bld 171 (H) 70 - 99 mg/dL   BUN 21 8 - 23 mg/dL   Creatinine, Ser 0.89 0.44 - 1.00 mg/dL   Calcium 9.0 8.9 - 10.3 mg/dL   GFR, Estimated >60 >60 mL/min   Anion gap 7 5 - 15  CBC with Differential     Status: Abnormal   Collection Time: 03/15/21  6:03 PM  Result Value Ref Range   WBC 14.5 (H) 4.0 - 10.5 K/uL   RBC 4.51 3.87 - 5.11 MIL/uL   Hemoglobin 13.5 12.0 - 15.0 g/dL   HCT 41.5 36.0 - 46.0 %   MCV 92.0 80.0 - 100.0 fL   MCH 29.9 26.0 - 34.0 pg   MCHC 32.5 30.0 - 36.0 g/dL   RDW 14.1 11.5 - 15.5 %   Platelets 289 150 - 400 K/uL   nRBC 0.0 0.0 - 0.2 %   Neutrophils Relative % 84 %   Neutro Abs 12.3 (H) 1.7 - 7.7 K/uL   Lymphocytes Relative 7 %   Lymphs Abs 0.9 0.7 - 4.0 K/uL   Monocytes Relative 8 %   Monocytes Absolute 1.2 (H) 0.1 - 1.0 K/uL   Eosinophils Relative 0 %   Eosinophils Absolute 0.0 0.0 - 0.5 K/uL   Basophils Relative 0 %   Basophils Absolute 0.0 0.0 - 0.1 K/uL   Immature Granulocytes 1 %   Abs Immature Granulocytes 0.08 (H) 0.00 - 0.07 K/uL  Resp Panel by RT-PCR (Flu A&B, Covid) Nasopharyngeal Swab     Status: None   Collection Time: 03/15/21  7:44 PM   Specimen: Nasopharyngeal Swab; Nasopharyngeal(NP) swabs in vial transport medium  Result Value Ref Range   SARS Coronavirus 2 by RT PCR NEGATIVE NEGATIVE   Influenza A by PCR NEGATIVE NEGATIVE   Influenza B by PCR NEGATIVE NEGATIVE  Lactic acid, plasma     Status: None   Collection Time: 03/15/21  7:47 PM  Result Value Ref Range   Lactic Acid, Venous 1.5 0.5 - 1.9 mmol/L  Culture, blood (routine x 2)     Status: None (Preliminary result)   Collection Time: 03/15/21   7:47 PM   Specimen: BLOOD RIGHT ARM  Result Value Ref Range   Specimen Description BLOOD RIGHT ARM    Special Requests      BOTTLES DRAWN AEROBIC AND ANAEROBIC Blood Culture results may not be optimal due to an inadequate volume of blood received in culture bottles   Culture      NO GROWTH < 12 HOURS Performed at [facility], [address], [place], [place] [address]    Report Status PENDING   Culture, blood (routine x 2)     Status: None (Preliminary result)   Collection Time: 03/15/21  7:47 PM   Specimen: Left Antecubital; Blood  Result Value Ref Range   Specimen Description LEFT ANTECUBITAL    Special Requests      BOTTLES DRAWN AEROBIC AND ANAEROBIC Blood Culture results may not be optimal due to an excessive volume of  blood received in culture bottles   Culture      NO GROWTH < 12 HOURS Performed at [facility], [address], [place], [place] [address]    Report Status PENDING   CBG monitoring, ED     Status: Abnormal   Collection Time: 03/16/21 12:59 AM  Result Value Ref Range   Glucose-Capillary 172 (H) 70 - 99 mg/dL   Recent Results (from the past 240 hour(s))  Microscopic Examination     Status: Abnormal   Collection Time: 03/14/21  3:43 PM   Urine  Result Value Ref Range Status   WBC, UA 11-30 (A) 0 - 5 /hpf Final   RBC >30 (A) 0 - 2 /hpf Final   Epithelial Cells (non renal) 0-10 0 - 10 /hpf Final   Renal Epithel, UA None seen None seen /hpf Final   Bacteria, UA None seen None seen/Few Final  Resp Panel by RT-PCR (Flu A&B, Covid) Nasopharyngeal Swab     Status: None   Collection Time: 03/15/21  7:44 PM   Specimen: Nasopharyngeal Swab; Nasopharyngeal(NP) swabs in vial transport medium  Result Value Ref Range Status   SARS Coronavirus 2 by RT PCR NEGATIVE NEGATIVE Final    Comment: (NOTE) SARS-CoV-2 target nucleic acids are NOT DETECTED.  The SARS-CoV-2 RNA is generally detectable in upper respiratory specimens during the acute phase of infection. The  lowest concentration of SARS-CoV-2 viral copies this assay can detect is 138 copies/mL. A negative result does not preclude SARS-Cov-2 infection and should not be used as the sole basis for treatment or other patient management decisions. A negative result may occur with  improper specimen collection/handling, submission of specimen other than nasopharyngeal swab, presence of viral mutation(s) within the areas targeted by this assay, and inadequate number of viral copies(<138 copies/mL). A negative result must be combined with clinical observations, patient history, and epidemiological information. The expected result is Negative.  Fact Sheet for Patients:  EntrepreneurPulse.com.au  Fact Sheet for Healthcare Providers:  IncredibleEmployment.be  This test is no t yet approved or cleared by the [place] FDA and  has been authorized for detection and/or diagnosis of SARS-CoV-2 by FDA under an Emergency Use Authorization (EUA). This EUA will remain  in effect (meaning this test can be used) for the duration of the COVID-19 declaration under Section 564(b)(1) of the Act, 21 U.S.C.section 360bbb-3(b)(1), unless the authorization is terminated  or revoked sooner.       Influenza A by PCR NEGATIVE NEGATIVE Final   Influenza B by PCR NEGATIVE NEGATIVE Final    Comment: (NOTE) The Xpert Xpress SARS-CoV-2/FLU/RSV plus assay is intended as an aid in the diagnosis of influenza from Nasopharyngeal swab specimens and should not be used as a sole basis for treatment. Nasal washings and aspirates are unacceptable for Xpert Xpress SARS-CoV-2/FLU/RSV testing.  Fact Sheet for Patients: EntrepreneurPulse.com.au  Fact Sheet for Healthcare Providers: IncredibleEmployment.be  This test is not yet approved or cleared by the [place] FDA and has been authorized for detection and/or diagnosis of SARS-CoV-2 by FDA under  an Emergency Use Authorization (EUA). This EUA will remain in effect (meaning this test can be used) for the duration of the COVID-19 declaration under Section 564(b)(1) of the Act, 21 U.S.C. section 360bbb-3(b)(1), unless the authorization is terminated or revoked.  Performed at [facility], [address] [place], [place] [address]   Culture, blood (routine x 2)     Status: None (Preliminary result)   Collection Time: 03/15/21  7:47 PM   Specimen: BLOOD RIGHT ARM  Result Value Ref Range Status   Specimen Description BLOOD RIGHT ARM  Final   Special Requests   Final    BOTTLES DRAWN AEROBIC AND ANAEROBIC Blood Culture results may not be optimal due to an inadequate volume of blood received in culture bottles   Culture   Final    NO GROWTH < 12 HOURS Performed at [facility], [address] [place], [place] [address]    Report Status PENDING  Incomplete  Culture, blood (routine x 2)     Status: None (Preliminary result)   Collection Time: 03/15/21  7:47 PM   Specimen: Left Antecubital; Blood  Result Value Ref Range Status   Specimen Description LEFT ANTECUBITAL  Final   Special Requests   Final    BOTTLES DRAWN AEROBIC AND ANAEROBIC Blood Culture results may not be optimal due to an excessive volume of blood received in culture bottles   Culture   Final    NO GROWTH < 12 HOURS Performed at [facility], [address], [place], [place] [address]    Report Status PENDING  Incomplete    Renal Function: Recent Labs    03/15/21 1803  CREATININE 0.89   Estimated Creatinine Clearance: 39.9 mL/min (by C-G formula based on SCr of 0.89 mg/dL).  Radiologic Imaging: CT ABDOMEN PELVIS W CONTRAST  Result Date: 03/15/2021 CLINICAL DATA:  Left lower quadrant pain. Patient reports fever today. EXAM: CT ABDOMEN AND PELVIS WITH CONTRAST TECHNIQUE: Multidetector CT imaging of the abdomen and pelvis was performed using the standard protocol following bolus administration of intravenous  contrast. RADIATION DOSE REDUCTION: This exam was performed according to the departmental dose-optimization program which includes automated exposure control, adjustment of the mA and/or kV according to patient size and/or use of iterative reconstruction technique. CONTRAST:  159m OMNIPAQUE IOHEXOL 300 MG/ML  SOLN COMPARISON:  11/30/2014 FINDINGS: Lower chest: Bandlike linear atelectasis in the right left lower lobe. Mild compressive atelectasis in the right middle lobe. No pleural effusion or confluent consolidation. Hepatobiliary: Mild focal fatty infiltration adjacent to the gallbladder fossa and falciform ligament. No discrete liver lesion. Post cholecystectomy. Prominent common bile duct likely related to prior cholecystectomy. There is also mild intrahepatic biliary ductal dilatation no evidence of biliary obstruction. Pancreas: Parenchymal atrophy. No ductal dilatation or inflammation. Spleen: Normal in size without focal abnormality. Adrenals/Urinary Tract: No adrenal nodule. Moderate to advanced right hydroureteronephrosis. Ureter is dilated to a 7 mm obstructing stone in the distal ureter, series 2, image 55. The more distal ureter is decompressed. There is only mild right perinephric edema. Slight decreased right renal enhancement. Calyceal diverticulum in the upper right kidney. Additional small scattered right renal cysts. There is no left hydronephrosis. Left renal cortical and parapelvic cysts. No left perinephric edema. Decompressed left ureter. Partially distended urinary bladder. Stomach/Bowel: Detailed bowel assessment is limited in the absence of enteric contrast and paucity of intra-abdominal fat. Nondistended stomach and duodenum. No small bowel obstruction or inflammation. There is fecalization of distal small bowel contents. Cecum is high-riding in the right mid abdomen. The appendix is not confidently visualized no evidence of appendicitis. Moderate volume of stool throughout the colon.  There is colonic redundancy. Diverticulosis is prominent involving the descending and sigmoid colon. No acute diverticulitis or acute colonic inflammation. Vascular/Lymphatic: Moderate aortic atherosclerosis with aortic tortuosity. No aortic aneurysm. Patent portal and splenic veins. No bulky abdominopelvic adenopathy. Reproductive: Status post hysterectomy. No adnexal masses. Other: No free air, free  fluid, or intra-abdominal fluid collection. Minimal fat in the left inguinal canal. Musculoskeletal: Mild to moderate L1 superior endplate compression fracture has sclerotic margins and is likely chronic, however is new from prior exam. Scoliosis and degenerative change in the spine. No focal bone lesion or acute osseous abnormalities. IMPRESSION: 1. Obstructing 7 mm stone in the distal right ureter with moderate to advanced right hydroureteronephrosis. 2. Colonic diverticulosis without acute inflammation. 3. Mild to moderate L1 superior endplate compression fracture has sclerotic margins and is likely chronic, however is new from prior exam. Recommend correlation for history of back pain. Aortic Atherosclerosis (ICD10-I70.0). Electronically Signed   By: Keith Rake M.D.   On: 03/15/2021 20:53    I independently reviewed the above imaging studies.  Impression/Recommendation: Obstructing right ureteral stone with signs of infection.  CT A/P 03/15/2021 with 7 mm posterior distal right ureteral stone with severe right hydronephrosis.  Febrile at 102F.  WBC 14.5, creatinine 0.89, urinalysis negative nitrates, urine culture pending. Gross hematuria: Underwent negative cystoscopy in [place] in 02/2021  -The risks, benefits and alternatives of cystoscopy with right JJ stent placement was discussed with the patient.  Risks include, but are not limited to: bleeding, urinary tract infection, ureteral injury, ureteral stricture disease, chronic pain, urinary symptoms, bladder injury, stent migration, the need for  nephrostomy tube placement, MI, CVA, DVT, PE and the inherent risks with general anesthesia.  The patient voices understanding and wishes to proceed.  -Admit to medicine -Continue broad-spectrum antibiotics -Follow-up urine culture and blood culture -I will arrange for follow-up in [place] for follow-up and for her to schedule definitive treatment of her right ureteral stone.  Matt R. Michaelpaul Apo MD 03/16/2021, 3:30 AM  [facility]  Pager: [phone]   CC: Bernadette Hoit, DO

## 2021-03-16 NOTE — Plan of Care (Signed)
Poc initiated

## 2021-03-16 NOTE — Progress Notes (Signed)
1 Day Post-Op Subjective: Postop note. Denies pain. Resting comfortably. Afebrile.   Objective: Vital signs in last 24 hours: Temp:  [97.2 F (36.2 C)-102 F (38.9 C)] 97.2 F (36.2 C) (01/27 1017) Pulse Rate:  [80-106] 80 (01/27 1017) Resp:  [16-21] 18 (01/27 1017) BP: (106-158)/(48-69) 106/48 (01/27 1017) SpO2:  [95 %-100 %] 95 % (01/27 1017) Weight:  [62.1 kg] 62.1 kg (01/27 0229)  Intake/Output from previous day: 01/26 0701 - 01/27 0700 In: 100 [P.O.:100] Out: 225 [Urine:225] Intake/Output this shift: Total I/O In: 240 [P.O.:240] Out: -   UOP: 216ml recorded, about 316m in urometer, clear yellow  Physical Exam:  General: Alert and oriented CV: RRR Lungs: Clear Abdomen: Soft, ND, NT Ext: NT, No erythema  Lab Results: Recent Labs    03/15/21 1803 03/16/21 0310  HGB 13.5 13.3  HCT 41.5 42.1   BMET Recent Labs    03/15/21 1803 03/16/21 0310  NA 133* 135  K 3.7 3.3*  CL 101 101  CO2 25 25  GLUCOSE 171* 182*  BUN 21 19  CREATININE 0.89 0.70  CALCIUM 9.0 8.9     Studies/Results: CT ABDOMEN PELVIS W CONTRAST  Result Date: 03/15/2021 CLINICAL DATA:  Left lower quadrant pain. Patient reports fever today. EXAM: CT ABDOMEN AND PELVIS WITH CONTRAST TECHNIQUE: Multidetector CT imaging of the abdomen and pelvis was performed using the standard protocol following bolus administration of intravenous contrast. RADIATION DOSE REDUCTION: This exam was performed according to the departmental dose-optimization program which includes automated exposure control, adjustment of the mA and/or kV according to patient size and/or use of iterative reconstruction technique. CONTRAST:  128mL OMNIPAQUE IOHEXOL 300 MG/ML  SOLN COMPARISON:  11/30/2014 FINDINGS: Lower chest: Bandlike linear atelectasis in the right left lower lobe. Mild compressive atelectasis in the right middle lobe. No pleural effusion or confluent consolidation. Hepatobiliary: Mild focal fatty infiltration adjacent  to the gallbladder fossa and falciform ligament. No discrete liver lesion. Post cholecystectomy. Prominent common bile duct likely related to prior cholecystectomy. There is also mild intrahepatic biliary ductal dilatation no evidence of biliary obstruction. Pancreas: Parenchymal atrophy. No ductal dilatation or inflammation. Spleen: Normal in size without focal abnormality. Adrenals/Urinary Tract: No adrenal nodule. Moderate to advanced right hydroureteronephrosis. Ureter is dilated to a 7 mm obstructing stone in the distal ureter, series 2, image 55. The more distal ureter is decompressed. There is only mild right perinephric edema. Slight decreased right renal enhancement. Calyceal diverticulum in the upper right kidney. Additional small scattered right renal cysts. There is no left hydronephrosis. Left renal cortical and parapelvic cysts. No left perinephric edema. Decompressed left ureter. Partially distended urinary bladder. Stomach/Bowel: Detailed bowel assessment is limited in the absence of enteric contrast and paucity of intra-abdominal fat. Nondistended stomach and duodenum. No small bowel obstruction or inflammation. There is fecalization of distal small bowel contents. Cecum is high-riding in the right mid abdomen. The appendix is not confidently visualized no evidence of appendicitis. Moderate volume of stool throughout the colon. There is colonic redundancy. Diverticulosis is prominent involving the descending and sigmoid colon. No acute diverticulitis or acute colonic inflammation. Vascular/Lymphatic: Moderate aortic atherosclerosis with aortic tortuosity. No aortic aneurysm. Patent portal and splenic veins. No bulky abdominopelvic adenopathy. Reproductive: Status post hysterectomy. No adnexal masses. Other: No free air, free fluid, or intra-abdominal fluid collection. Minimal fat in the left inguinal canal. Musculoskeletal: Mild to moderate L1 superior endplate compression fracture has sclerotic  margins and is likely chronic, however is new from prior exam. Scoliosis  and degenerative change in the spine. No focal bone lesion or acute osseous abnormalities. IMPRESSION: 1. Obstructing 7 mm stone in the distal right ureter with moderate to advanced right hydroureteronephrosis. 2. Colonic diverticulosis without acute inflammation. 3. Mild to moderate L1 superior endplate compression fracture has sclerotic margins and is likely chronic, however is new from prior exam. Recommend correlation for history of back pain. Aortic Atherosclerosis (ICD10-I70.0). Electronically Signed   By: Keith Rake M.D.   On: 03/15/2021 20:53   DG C-Arm 1-60 Min-No Report  Result Date: 03/16/2021 Fluoroscopy was utilized by the requesting physician.  No radiographic interpretation.    Assessment/Plan: Obstructing right ureteral stone with signs of infection.  CT A/P 03/15/2021 with 7 mm posterior distal right ureteral stone with severe right hydronephrosis.  Febrile at 102F.  WBC 14.5, creatinine 0.89, urinalysis negative nitrates, urine culture pending. S/p cysto, R RPG, R stent 03/16/2021. Gross hematuria: Underwent negative cystoscopy in [place] in 02/2021  -Pain control prn -Diet as tolerated -Continue broad spectrum abx -F/u renal pelvis urine culture -Plan to discharge home on at least 10 days culture specific antibiotics -Messaged Dr. Alyson Ingles to arrange for cysto, R RPG, R URS/LL in [place] as she follows with him there -Following peripherally. Please call with questions.   LOS: 1 day   Matt R. Yader Criger MD 03/16/2021, 10:49 AM [facility]  Pager: [phone]

## 2021-03-16 NOTE — Progress Notes (Signed)
PROGRESS NOTE  KATHLEE BARNHARDT XEN:[id number] DOB: 11-17-1933 DOA: 03/15/2021 PCP: Coral Spikes, DO  Brief History   Deborah Gray is a 88 y.o. female with medical history significant for hypertension, hyperlipidemia, T2DM who presents to the emergency department due to several day onset of lower abdominal pain.  Patient complained of about 5-week onset of blood in urine and she was following up with her urologist due to history of interstitial cystitis, the blood in urine improved and she now complains of several day onset of painful urination and lower abdominal pain.  She endorsed fever up to 102F today, she took Tylenol which seemed to improve the fever and she decided to go to the ED for further evaluation and management.  Urinalysis and urine culture done yesterday by urology was still pending, patient was started on antibiotics yesterday   ED Course:  In the emergency department, she was febrile with a temperature of 102F, BP 143/68 and was tachycardic, but other vital signs were within normal range.  Work-up in the ED showed normal CBC except for leukocytosis and normal BMP except for mild hyponatremia and hyperglycemia, lactic acid 1.5, urinalysis was positive for large leukocytes and >50 leukocytes.  Influenza A, B, SARS coronavirus 2 was negative. CT abdomen and pelvis with contrast showed obstructing 7 mm stone in the distal right ureter with moderate to advanced right hydroureteronephrosis. Patient was treated with IV ceftriaxone, IV fentanyl was given.  Urologist (Dr. Abner Greenspan) was consulted and recommended admitting patient to [facility] with possibility of stenting her tonight.  The patient underwent right retrograde pyelogram with interpretation, cystoscopy, and right ureteral stent placement with fluoroscopy and foley catheter placement with Dr. Abner Greenspan in the early morning hours of 03/16/2021.  The patient states that her pain in relieved. No new complaints.  She is receiving IV  ceftriaxone and IV fluids.  Consultants  Urology  Procedures  ight retrograde pyelogram with interpretation, cystoscopy, and right ureteral stent placement with fluoroscopy and foley catheter placement   Antibiotics   Anti-infectives (From admission, onward)    Start     Dose/Rate Route Frequency Ordered Stop   03/16/21 2000  cefTRIAXone (ROCEPHIN) 1 g in sodium chloride 0.9 % 100 mL IVPB        1 g 200 mL/hr over 30 Minutes Intravenous Every 24 hours 03/15/21 2328     03/15/21 2015  cefTRIAXone (ROCEPHIN) 1 g in sodium chloride 0.9 % 100 mL IVPB        1 g 200 mL/hr over 30 Minutes Intravenous  Once 03/15/21 2014 03/15/21 2204      Subjective  The patient is resting comfortably. No new complaints.   Objective   Vitals:  Vitals:   03/16/21 1357 03/16/21 1539  BP: (!) 95/51   Pulse: 77   Resp: 17   Temp: (!) 97.5 F (36.4 C) (!) 97.5 F (36.4 C)  SpO2: 98%     Exam:  Constitutional:  The patient is awake, alert, and oriented x 3. No acute distress. Respiratory:  No increased work of breathing. No wheezes, rales, or rhonchi No tactile fremitus Cardiovascular:  Regular rate and rhythm No murmurs, ectopy, or gallups. No lateral PMI. No thrills. Abdomen:  Abdomen is soft, non-tender, non-distended No hernias, masses, or organomegaly Normoactive bowel sounds.  Musculoskeletal:  No cyanosis, clubbing, or edema Skin:  No rashes, lesions, ulcers palpation of skin: no induration or nodules Neurologic:  CN 2-12 intact Sensation all 4 extremities intact Psychiatric:  Mental  status Mood, affect appropriate Orientation to person, place, time  judgment and insight appear intact   I have personally reviewed the following:   Today's Data  Vitals  Lab Data  CBC CMP  Micro Data  Urine culture has had no growth Blood cultures x 2 have had no growth  Imaging  CT abdomen and pelvis with contrast  Cardiology Data  Echocardiogram 03/08/2020  Other Data     Scheduled Meds:  Chlorhexidine Gluconate Cloth  6 each Topical Daily   ezetimibe  10 mg Oral Daily   insulin aspart  0-9 Units Subcutaneous Q4H   lipase/protease/amylase  72,000 Units Oral TID WC   oxyCODONE       pantoprazole  40 mg Oral Daily   Continuous Infusions:  cefTRIAXone (ROCEPHIN)  IV      Principal Problem:   UTI (urinary tract infection) Active Problems:   Essential hypertension   Hyperlipidemia, unspecified   Acute lower UTI   Nephrolithiasis   Hyperglycemia due to diabetes mellitus ([place])   Leukocytosis   Hyponatremia   Sepsis ([place])   LOS: 1 day   A & P  Sepsis secondary to UTI POA Patient met sepsis criteria due to being febrile, tachycardic and having leukocytosis with urinary tract being source of infection. Patient was started on IV ceftriaxone, we shall continue with same at this time Continue Tylenol as needed  Urinalysis was positive for large leukocytes and > 50 WBCs Urine culture and blood culture pending  Right-sided nephrolithiasis with moderate to advanced right hydroureteronephrosis Urologist at [facility] was consulted and recommended admitting patient to [facility] with plan to possibly stent the patient on arrival to [facility]  Leukocytosis possibly due to #1 Continue treatment as described for sepsis due to UTI  Hyponatremia Na 133, continue to monitor sodium levels  Hyperglycemia secondary to T2DM A1c on 11/29/2020 was 6.3 Continue ISS and hypoglycemic protocol  Essential hypertension Continue lisinopril  Hyperlipidemia Continue Zetia   GERD Continue Protonix   Other home meds: Creon   I have seen and examined this patient myself. I have spent 34 minutes in her evaluation and care.   DVT prophylaxis: SCDs   Code Status: Full code   Family Communication: None available   Disposition Plan:  Patient is from:                        home Anticipated DC to:                   SNF or family members home Anticipated DC  date:               2-3 days Anticipated DC barriers:          IV antibiotics  Brayen Bunn, DO [facility] Direct contact: see [email or web address]  7PM-7AM contact night coverage as above 03/16/2021, 3:50 PM  LOS: 1 day

## 2021-03-16 NOTE — Progress Notes (Signed)
°  Transition of Care [facility]) Screening Note   Patient Details  Name: Deborah Gray Date of Birth: October 03, 1933   Transition of Care [facility]) CM/SW Contact:    Lennart Pall, LCSW Phone Number: 03/16/2021, 1:47 PM    Transition of Care Department [facility]) has reviewed patient and no TOC needs have been identified at this time. We will continue to monitor patient advancement through interdisciplinary progression rounds. If new patient transition needs arise, please place a TOC consult.

## 2021-03-16 NOTE — Plan of Care (Signed)

## 2021-03-16 NOTE — Op Note (Signed)
Operative Note  Preoperative diagnosis:  1.  Right ureteral stone with obstruction and UTI  Postoperative diagnosis: 1.  Right ureteral stone with obstruction and UTI  Procedure(s): 1.  Cystoscopy 2. Right retrograde pyelogram with interpretation 3. Right ureteral stent placement 4. Fluoroscopy <1 hour with intraoperative interpretation 5. Foley catheter placement  Surgeon: Rexene Alberts, MD  Assistants:  None  Anesthesia:  General  Complications:  None  EBL:  Minimal  Specimens: 1. Right renal pelvis urine for culture  Drains/Catheters: 1.  Right 6Fr x 24cm ureteral stent  Intraoperative findings:   Cystoscopy demonstrated no suspicious lesions, masses, stones or other pathology. Right retrograde pyelogram demonstrated severe right hydronephrosis. Successful right ureteral stent placement with curl in the renal pelvis and bladder respectively.  Indication:  Deborah Gray is a 88 y.o. female with an obstructing 31mm distal right ureteral stone with signs of infection. After reviewing the management options for treatment, she elected to proceed with the above surgical procedure(s). We have discussed the potential benefits and risks of the procedure, side effects of the proposed treatment, the likelihood of the patient achieving the goals of the procedure, and any potential problems that might occur during the procedure or recuperation. Informed consent has been obtained.  Description of procedure: The patient was taken to the operating room and general anesthesia was induced.  The patient was placed in the dorsal lithotomy position, prepped and draped in the usual sterile fashion, and preoperative antibiotics were administered. A preoperative time-out was performed.   Cystourethroscopy was performed.  The patients urethra was examined and was normal. The bladder was then systematically examined in its entirety. There was no evidence for any bladder tumors, stones, or other  mucosal pathology.    Attention then turned to the right ureteral orifice. A 0.038 zip wire was passed through the right orifice and over the wire a 5 Fr open ended catheter was inserted and passed up to the level of the renal pelvis. There was some difficulty passing this and the stone was likely impacted. Aspirate was obtained and sent off as right renal pelvis urine for culture This was clear and not cloudy. Omnipaque contrast was injected through the ureteral catheter and a retrograde pyelogram was performed with findings as dictated above. The wire was then replaced and the open ended catheter was removed.   A 6Fr x 24cm ureteral stent was advance over the wire. The stent was positioned appropriately under fluoroscopic and cystoscopic guidance.  The wire was then removed with an adequate stent curl noted in the renal pelvis as well as in the bladder.  Foley catheter was placed. The bladder was then emptied and the procedure ended.  The patient appeared to tolerate the procedure well and without complications.  The patient was able to be awakened and transferred to the recovery unit in satisfactory condition.   Plan:  Continue broad spectrum antibiotics. Void trial after afebrile for 24 hours. Will require definitive treatment of stone as outpatient. Messaged schedulers in [place] to arrange.  [facility]  Pager: [phone]

## 2021-03-16 NOTE — Plan of Care (Signed)
°  Problem: Health Behavior/Discharge Planning: Goal: Ability to manage health-related needs will improve Outcome: Progressing   Problem: Clinical Measurements: Goal: Ability to maintain clinical measurements within normal limits will improve Outcome: Progressing    Problem: Safety: Goal: Ability to remain free from injury will improve 03/16/2021 1059 by Williams Che, RN Outcome: Progressing 03/16/2021 1057 by Williams Che, RN Outcome: Progressing   Problem: Skin Integrity: Goal: Risk for impaired skin integrity will decrease 03/16/2021 1059 by Williams Che, RN Outcome: Progressing 03/16/2021 1057 by Williams Che, RN Outcome: Progressing

## 2021-03-17 DIAGNOSIS — E1165 Type 2 diabetes mellitus with hyperglycemia: Secondary | ICD-10-CM | POA: Diagnosis not present

## 2021-03-17 DIAGNOSIS — N200 Calculus of kidney: Secondary | ICD-10-CM | POA: Diagnosis not present

## 2021-03-17 DIAGNOSIS — N390 Urinary tract infection, site not specified: Secondary | ICD-10-CM | POA: Diagnosis not present

## 2021-03-17 DIAGNOSIS — I10 Essential (primary) hypertension: Secondary | ICD-10-CM | POA: Diagnosis not present

## 2021-03-17 LAB — CBC WITH DIFFERENTIAL/PLATELET
Abs Immature Granulocytes: 0.07 10*3/uL (ref 0.00–0.07)
Basophils Absolute: 0 10*3/uL (ref 0.0–0.1)
Basophils Relative: 0 %
Eosinophils Absolute: 0.1 10*3/uL (ref 0.0–0.5)
Eosinophils Relative: 0 %
HCT: 34.3 % — ABNORMAL LOW (ref 36.0–46.0)
Hemoglobin: 11.3 g/dL — ABNORMAL LOW (ref 12.0–15.0)
Immature Granulocytes: 1 %
Lymphocytes Relative: 13 %
Lymphs Abs: 1.6 10*3/uL (ref 0.7–4.0)
MCH: 29.7 pg (ref 26.0–34.0)
MCHC: 32.9 g/dL (ref 30.0–36.0)
MCV: 90 fL (ref 80.0–100.0)
Monocytes Absolute: 1.4 10*3/uL — ABNORMAL HIGH (ref 0.1–1.0)
Monocytes Relative: 12 %
Neutro Abs: 8.8 10*3/uL — ABNORMAL HIGH (ref 1.7–7.7)
Neutrophils Relative %: 74 %
Platelets: 227 10*3/uL (ref 150–400)
RBC: 3.81 MIL/uL — ABNORMAL LOW (ref 3.87–5.11)
RDW: 13.7 % (ref 11.5–15.5)
WBC: 11.9 10*3/uL — ABNORMAL HIGH (ref 4.0–10.5)
nRBC: 0 % (ref 0.0–0.2)

## 2021-03-17 LAB — BASIC METABOLIC PANEL
Anion gap: 6 (ref 5–15)
BUN: 31 mg/dL — ABNORMAL HIGH (ref 8–23)
CO2: 24 mmol/L (ref 22–32)
Calcium: 8.9 mg/dL (ref 8.9–10.3)
Chloride: 101 mmol/L (ref 98–111)
Creatinine, Ser: 0.8 mg/dL (ref 0.44–1.00)
GFR, Estimated: >60 mL/min (ref >60)
Glucose, Bld: 115 mg/dL — ABNORMAL HIGH (ref 70–99)
Potassium: 3.9 mmol/L (ref 3.5–5.1)
Sodium: 131 mmol/L — ABNORMAL LOW (ref 135–145)

## 2021-03-17 LAB — GLUCOSE, CAPILLARY
Glucose-Capillary: 116 mg/dL — ABNORMAL HIGH (ref 70–99)
Glucose-Capillary: 113 mg/dL — ABNORMAL HIGH (ref 70–99)
Glucose-Capillary: 142 mg/dL — ABNORMAL HIGH (ref 70–99)

## 2021-03-17 LAB — URINE CULTURE
Culture: <10000 — AB
Culture: NO GROWTH

## 2021-03-17 LAB — HEMOGLOBIN A1C
Hgb A1c MFr Bld: 6.1 % — ABNORMAL HIGH (ref 4.8–5.6)
Mean Plasma Glucose: 128 mg/dL

## 2021-03-17 MED ORDER — AMOXICILLIN-POT CLAVULANATE 875-125 MG PO TABS: 1.0000 | ORAL_TABLET | Freq: Two times a day (BID) | ORAL | 0 refills | Status: DC

## 2021-03-17 NOTE — Discharge Summary (Signed)
Physician Discharge Summary  [place] IFB:[id number] DOB: 1933-08-13 DOA: 03/15/2021   PCP: Coral Spikes, DO   Admit date: 03/15/2021 Discharge date: 03/17/2021   Recommendations for Outpatient Follow-up:  Discharge to home. Follow up with PCP in 7-10 days. Follow up with Dr. Leonia Corona as directed. Complete antibiotics Continue q 4 week tube exchanges.   Discharge Diagnoses: Principal diagnosis is #1 Sepsis secondary to UTI (POA) Right sided nephrolithiasis with moderate to advanced right hydroureteronephrosis Leukocytosis Hyponatremia Hyperglycemia due to DM II Essential Hypertension Hyperlipidemia GERD    Discharge Condition: Fair   Disposition: Home   Diet recommendation: Modified carbohydrate      Filed Weights    03/16/21 0229  Weight: 62.1 kg      History of present illness:   Deborah Gray is a 88 y.o. female with medical history significant for hypertension, hyperlipidemia, T2DM who presents to the emergency department due to several day onset of lower abdominal pain.  Patient complained of about 5-week onset of blood in urine and she was following up with her urologist due to history of interstitial cystitis, the blood in urine improved and she now complains of several day onset of painful urination and lower abdominal pain.  She endorsed fever up to 102F today, she took Tylenol which seemed to improve the fever and she decided to go to the ED for further evaluation and management.  Urinalysis and urine culture done yesterday by urology was still pending, patient was started on antibiotics yesterday   ED Course:  In the emergency department, she was febrile with a temperature of 102F, BP  143/68 and was tachycardic, but other vital signs were within normal range.  Work-up in the ED showed normal CBC except for leukocytosis and normal BMP except for mild hyponatremia and hyperglycemia, lactic acid 1.5, urinalysis was positive for large leukocytes and >50 leukocytes.  Influenza A, B, SARS coronavirus 2 was negative. CT abdomen and pelvis with contrast showed obstructing 7 mm stone in the distal right ureter with moderate to advanced right hydroureteronephrosis. Patient was treated with IV ceftriaxone, IV fentanyl was given.  Urologist (Dr. Abner Greenspan) was consulted and recommended admitting patient to [facility] with possibility of stenting her tonight.   Hospital Course:  The patient underwent right retrograde pyelogram with interpretation, cystoscopy, and right  ureteral stent placement with fluoroscopy and foley catheter placement with Dr. Abner Greenspan in the early morning hours of 03/16/2021.   The patient states that her pain in relieved. No new complaints.   She is received IV ceftriaxone and IV fluids.   The patient is appropriate for discharge to home.   Today's assessment: S: The patient is resting comfortably. No new complaints. O: Vitals:      Vitals:    03/16/21 2114 03/17/21 0406  BP: (!) 105/55 116/62  Pulse: 70 70  Resp: 16 16  Temp: 97.8 F (36.6 C) 97.8 F (36.6 C)  SpO2: 96% 95%      Exam:   Constitutional:  The patient is awake, alert, and oriented x 3. No acute distress. Respiratory:  No increased work of breathing. No wheezes, rales, or rhonchi No tactile fremitus Cardiovascular:  Regular rate and rhythm No murmurs, ectopy, or gallups. No lateral PMI. No thrills. Abdomen:  Abdomen is soft, non-tender, non-distended No hernias, masses, or organomegaly Normoactive bowel sounds.  Musculoskeletal:  No cyanosis, clubbing, or edema Skin:  No rashes, lesions, ulcers palpation of skin: no induration or nodules Neurologic:  CN 2-12 intact Sensation all 4  extremities intact Psychiatric:  Mental status Mood, affect appropriate Orientation to person, place, time  judgment and insight appear intact       Discharge Instructions   Discharge Instructions       Activity as tolerated - No restrictions   Complete by: As directed      Call MD for:  extreme fatigue   Complete by: As directed      Call MD for:  persistant dizziness or light-headedness   Complete by: As directed      Call MD for:  persistant nausea and vomiting   Complete by: As directed      Call MD for:  temperature >100.4   Complete by: As directed      Diet - low sodium heart healthy   Complete by: As directed      Discharge instructions   Complete by: As directed      Discharge to home. Follow up with PCP in 7-10 days. Follow up with Dr. Leonia Corona as directed. Complete antibiotics Continue q 4 week tube exchanges.    Increase activity slowly   Complete by: As directed           Allergies as of 03/17/2021         Reactions    Adhesive [tape] Other (See Comments)    Reaction:  Blisters     Estrogens Other (See Comments)    Reaction:  Migraines     Sulfonamide Derivatives Rash            Medication List       STOP taking these medications     nitrofurantoin (macrocrystal-monohydrate) 100 MG capsule Commonly known as: MACROBID    NON FORMULARY           TAKE these medications     acetaminophen 500 MG tablet Commonly known as: TYLENOL Take 500 mg by mouth at bedtime.    amoxicillin-clavulanate 875-125 MG tablet Commonly known as: Augmentin Take 1 tablet by mouth 2 (two) times daily for 10 days.    aspirin EC 81 MG tablet Take 81 mg by mouth at bedtime.    blood glucose meter kit and supplies Kit Dispense based on  insurance preference. Tests once a day E11.9    cholecalciferol 25 MCG (1000 UNIT) tablet Commonly known  as: VITAMIN D3 Take 1,000 Units by mouth daily.    clidinium-chlordiazePOXIDE 5-2.5 MG capsule Commonly known as:  LIBRAX Take 1 capsule by mouth 4 (four) times daily as needed (for esophageal symptoms). What changed: when to take this    clobetasol 0.05 % external solution Commonly known as: TEMOVATE 1 application daily as needed (itching).    Creon 24000-76000 units Cpep Generic drug: Pancrelipase (Lip-Prot-Amyl) TAKE 3 CAPSULES THREE TIMES DAILY WITH MEALS AND ONE CAPSULE WITH SNACK TWICE DAILY What changed: See the new instructions.    Ester-C 500-60 MG Tabs Take 1 tablet by mouth daily.    ezetimibe 10 MG tablet Commonly known as: ZETIA TAKE 1 TABLET BY MOUTH EVERY DAY What changed: when to take this    guaiFENesin 600 MG 12 hr tablet Commonly known as: MUCINEX Take 1,200 mg by mouth daily as needed for cough.    hyoscyamine 0.125 MG SL tablet Commonly known as: LEVSIN SL USE 1 TABLET UNDER THE TONGUE BEFORE MEALS AND AT BEDTIME AS NEEDED FOR FLARES IN SYMPTOMS, diarrhea/abdominal cramping What changed:  how much to take how to take this when to take this reasons to take this additional instructions    ipratropium 0.06 % nasal spray Commonly known as: ATROVENT Place 1 spray into both nostrils daily.    ketoconazole 2 % cream Commonly known as: NIZORAL Apply 1 application topically daily as needed for irritation (to affected area. external use only). What changed: when to take this    losartan 50 MG tablet Commonly known as: COZAAR Take 1 tablet (50 mg total) by mouth daily.    meclizine 25 MG tablet Commonly known as: ANTIVERT TAKE 1 TABLET BY MOUTH 3 TIMES A DAY AS NEEDED What changed:  when to take this reasons to take this    metFORMIN 500 MG tablet Commonly known as: GLUCOPHAGE TAKE ONE TABLET BY MOUTH EVERY MORNING ONE AT LUNCH AND 2 AT BEDTIME What changed: See the new instructions.    methenamine 1 g tablet Commonly known as: HIPREX Take 1 tablet (1 g total) by mouth 2 (two) times daily.    mirabegron ER 50 MG Tb24 tablet Commonly known as:  Myrbetriq Take 1 tablet (50 mg total) by mouth daily.    nateglinide 120 MG tablet Commonly known as: STARLIX TAKE 1 TABLET BY MOUTH 3 TIMES A DAY BEFORE MEALS What changed: See the new instructions.    omega-3 acid ethyl esters 1 g capsule Commonly known as: LOVAZA Take 2 g by mouth daily.    omeprazole 20 MG capsule Commonly known as: PRILOSEC TAKE 1 CAPSULE BY MOUTH EVERY DAY    PreserVision AREDS 2 Caps Take 1 capsule by mouth 2 (two) times daily.    Theratears 0.25 % Soln Generic drug: Carboxymethylcellulose Sodium Place 1 drop into both eyes 2 (two) times daily.                Allergies  Allergen Reactions   Adhesive [Tape] Other (See Comments)      Reaction:  Blisters    Estrogens Other (See Comments)      Reaction:  Migraines    Sulfonamide Derivatives Rash      The results of significant diagnostics from this hospitalization (including imaging, microbiology, ancillary and laboratory) are listed below for reference.     Significant Diagnostic Studies:  Imaging Results  CT ABDOMEN PELVIS W CONTRAST   Result Date: 03/15/2021 CLINICAL DATA:  Left lower quadrant pain. Patient reports fever today. EXAM:  CT ABDOMEN AND PELVIS WITH CONTRAST TECHNIQUE: Multidetector CT imaging of the abdomen and pelvis was performed using the standard protocol following bolus administration of intravenous contrast. RADIATION DOSE REDUCTION: This exam was performed according to the departmental dose-optimization program which includes automated exposure control, adjustment of the mA and/or kV according to patient size and/or use of iterative reconstruction technique. CONTRAST:  133mL OMNIPAQUE IOHEXOL 300 MG/ML  SOLN COMPARISON:  11/30/2014 FINDINGS: Lower chest: Bandlike linear atelectasis in the right left lower lobe. Mild compressive atelectasis in the right middle lobe. No pleural effusion or confluent consolidation. Hepatobiliary: Mild focal fatty infiltration adjacent to the  gallbladder fossa and falciform ligament. No discrete liver lesion. Post cholecystectomy. Prominent common bile duct likely related to prior cholecystectomy. There is also mild intrahepatic biliary ductal dilatation no evidence of biliary obstruction. Pancreas: Parenchymal atrophy. No ductal dilatation or inflammation. Spleen: Normal in size without focal abnormality. Adrenals/Urinary Tract: No adrenal nodule. Moderate to advanced right hydroureteronephrosis. Ureter is dilated to a 7 mm obstructing stone in the distal ureter, series 2, image 55. The more distal ureter is decompressed. There is only mild right perinephric edema. Slight decreased right renal enhancement. Calyceal diverticulum in the upper right kidney. Additional small scattered right renal cysts. There is no left hydronephrosis. Left renal cortical and parapelvic cysts. No left perinephric edema. Decompressed left ureter. Partially distended urinary bladder. Stomach/Bowel: Detailed bowel assessment is limited in the absence of enteric contrast and paucity of intra-abdominal fat. Nondistended stomach and duodenum. No small bowel obstruction or inflammation. There is fecalization of distal small bowel contents. Cecum is high-riding in the right mid abdomen. The appendix is not confidently visualized no evidence of appendicitis. Moderate volume of stool throughout the colon. There is colonic redundancy. Diverticulosis is prominent involving the descending and sigmoid colon. No acute diverticulitis or acute colonic inflammation. Vascular/Lymphatic: Moderate aortic atherosclerosis with aortic tortuosity. No aortic aneurysm. Patent portal and splenic veins. No bulky abdominopelvic adenopathy. Reproductive: Status post hysterectomy. No adnexal masses. Other: No free air, free fluid, or intra-abdominal fluid collection. Minimal fat in the left inguinal canal. Musculoskeletal: Mild to moderate L1 superior endplate compression fracture has sclerotic margins  and is likely chronic, however is new from prior exam. Scoliosis and degenerative change in the spine. No focal bone lesion or acute osseous abnormalities. IMPRESSION: 1. Obstructing 7 mm stone in the distal right ureter with moderate to advanced right hydroureteronephrosis. 2. Colonic diverticulosis without acute inflammation. 3. Mild to moderate L1 superior endplate compression fracture has sclerotic margins and is likely chronic, however is new from prior exam. Recommend correlation for history of back pain. Aortic Atherosclerosis (ICD10-I70.0). Electronically Signed   By: Keith Rake M.D.   On: 03/15/2021 20:53    DG C-Arm 1-60 Min-No Report   Result Date: 03/16/2021 Fluoroscopy was utilized by the requesting physician.  No radiographic interpretation.       Microbiology:        Recent Results (from the past 240 hour(s))  Microscopic Examination     Status: Abnormal    Collection Time: 03/14/21  3:43 PM    Urine  Result Value Ref Range Status    WBC, UA 11-30 (A) 0 - 5 /hpf Final    RBC >30 (A) 0 - 2 /hpf Final    Epithelial Cells (non renal) 0-10 0 - 10 /hpf Final    Renal Epithel, UA None seen None seen /hpf Final    Bacteria, UA None seen None seen/Few Final  Urine Culture  Status: Abnormal    Collection Time: 03/15/21  5:40 PM    Specimen: Urine, Clean Catch  Result Value Ref Range Status    Specimen Description     Final      URINE, CLEAN CATCH Performed at [facility], [address], [place], [place] [address]      Special Requests     Final      NONE Performed at [facility], [address], [place], [place] [address]      Culture (A)   Final      <10,000 COLONIES/mL INSIGNIFICANT GROWTH Performed at [facility] [address] [place], [place] [address]      Report Status 03/17/2021 FINAL   Final  Resp Panel by RT-PCR (Flu A&B, Covid) Nasopharyngeal Swab     Status: None    Collection Time: 03/15/21  7:44 PM    Specimen: Nasopharyngeal Swab;  Nasopharyngeal(NP) swabs in vial transport medium  Result Value Ref Range Status    SARS Coronavirus 2 by RT PCR NEGATIVE NEGATIVE Final      Comment: (NOTE) SARS-CoV-2 target nucleic acids are NOT DETECTED.   The SARS-CoV-2 RNA is generally detectable in upper respiratory specimens during the acute phase of infection. The lowest concentration of SARS-CoV-2 viral copies this assay can detect is 138 copies/mL. A negative result does not preclude SARS-Cov-2 infection and should not be used as the sole basis for treatment or other patient management decisions. A negative result may occur with  improper specimen collection/handling, submission of specimen other than nasopharyngeal swab, presence of viral mutation(s) within the areas targeted by this assay, and inadequate number of viral copies(<138 copies/mL). A negative result must be combined with clinical observations, patient history, and epidemiological information. The expected result is Negative.   Fact Sheet for Patients:  EntrepreneurPulse.com.au   Fact Sheet for Healthcare Providers:  IncredibleEmployment.be   This test is no t yet approved or cleared by the [place] FDA and  has been authorized for detection and/or diagnosis of SARS-CoV-2 by FDA under an Emergency Use Authorization (EUA). This EUA will remain  in effect (meaning this test can be used) for the duration of the COVID-19 declaration under Section 564(b)(1) of the Act, 21 U.S.C.section 360bbb-3(b)(1), unless the authorization is terminated  or revoked sooner.           Influenza A by PCR NEGATIVE NEGATIVE Final    Influenza B by PCR NEGATIVE NEGATIVE Final      Comment: (NOTE) The Xpert Xpress SARS-CoV-2/FLU/RSV plus assay is intended as an aid in the diagnosis of influenza from Nasopharyngeal swab specimens and should not be used as a sole basis for treatment. Nasal washings and aspirates are unacceptable for Xpert  Xpress SARS-CoV-2/FLU/RSV testing.   Fact Sheet for Patients: EntrepreneurPulse.com.au   Fact Sheet for Healthcare Providers: IncredibleEmployment.be   This test is not yet approved or cleared by the [place] FDA and has been authorized for detection and/or diagnosis of SARS-CoV-2 by FDA under an Emergency Use Authorization (EUA). This EUA will remain in effect (meaning this test can be used) for the duration of the COVID-19 declaration under Section 564(b)(1) of the Act, 21 U.S.C. section 360bbb-3(b)(1), unless the authorization is terminated or revoked.   Performed at [facility], [address], [place], [place] [address]    Culture, blood (routine x 2)     Status: None (Preliminary result)    Collection Time: 03/15/21  7:47 PM    Specimen: BLOOD  RIGHT ARM  Result Value Ref Range Status    Specimen Description BLOOD RIGHT ARM   Final    Special Requests     Final      BOTTLES DRAWN AEROBIC AND ANAEROBIC Blood Culture results may not be optimal due to an inadequate volume of blood received in culture bottles    Culture     Final      NO GROWTH 2 DAYS Performed at [facility], [address], [place], [place] [address]      Report Status PENDING   Incomplete  Culture, blood (routine x 2)     Status: None (Preliminary result)    Collection Time: 03/15/21  7:47 PM    Specimen: Left Antecubital; Blood  Result Value Ref Range Status    Specimen Description LEFT ANTECUBITAL   Final    Special Requests     Final      BOTTLES DRAWN AEROBIC AND ANAEROBIC Blood Culture results may not be optimal due to an excessive volume of blood received in culture bottles    Culture     Final      NO GROWTH 2 DAYS Performed at [facility], [address], [place], [place] [address]      Report Status PENDING   Incomplete  Urine Culture     Status: None (Preliminary result)    Collection Time: 03/16/21  4:28 AM    Specimen: Kidney  Result Value Ref Range  Status    Specimen Description     Final      KIDNEY RT RENAL Performed at [facility], [place] [address], [place], [place] [address]      Special Requests     Final      NONE Performed at [facility], [place] [address], [place], [place] [address]      Culture PENDING   Incomplete    Report Status PENDING   Incomplete      Labs: Basic Metabolic Panel: Last Labs        Recent Labs  Lab 03/15/21 1803 03/16/21 0310 03/17/21 0303  NA 133* 135 131*  K 3.7 3.3* 3.9  CL 101 101 101  CO2 $Re'25 25 24  'wfw$ GLUCOSE 171* 182* 115*  BUN 21 19 31*  CREATININE 0.89 0.70 0.80  CALCIUM 9.0 8.9 8.9  MG  --  1.8  --   PHOS  --  2.7  --       Liver Function Tests: Last Labs      Recent Labs  Lab 03/16/21 0310  AST 10*  ALT 13  ALKPHOS 52  BILITOT 0.5  PROT 6.8  ALBUMIN 3.4*      Last Labs   No results for input(s): LIPASE, AMYLASE in the last 168 hours.   Last Labs   No results for input(s): AMMONIA in the last 168 hours.   CBC: Last Labs        Recent Labs  Lab 03/15/21 1803 03/16/21 0310 03/17/21 0303  WBC 14.5* 13.6* 11.9*  NEUTROABS 12.3*  --  8.8*  HGB 13.5 13.3 11.3*  HCT 41.5 42.1 34.3*  MCV 92.0 90.9 90.0  PLT 289 247 227      Cardiac Enzymes: Last Labs   No results for input(s): CKTOTAL, CKMB, CKMBINDEX, TROPONINI in the last 168 hours.   BNP: BNP (last 3 results) Recent Labs (within last 365 days)  No results for input(s): BNP in the last 8760 hours.  ProBNP (last 3 results) Recent Labs (within last 365 days)  No results for input(s): PROBNP in the last 8760 hours.     CBG: Last Labs          Recent Labs  Lab 03/16/21 2111 03/16/21 2333 03/17/21 0404 03/17/21 0746 03/17/21 1114  GLUCAP 163* 128* 116* 113* 142*        Principal Problem:   UTI (urinary tract infection) Active Problems:   Essential hypertension   Hyperlipidemia, unspecified   Acute lower UTI   Nephrolithiasis   Hyperglycemia due to diabetes  mellitus ([place])   Leukocytosis   Hyponatremia   Sepsis ([place])     Time coordinating discharge: 38 minutes   Signed:         Karie Kirks, DO [facility]   03/17/2021, 3:27 PM         Note Details  Dewaine Conger, DO File Time 03/17/2021  3:27 PM  Author Type Physician Status Signed  Last Editor Karie Kirks, [facility] Acct # [id number] Admit Date 03/15/2021

## 2021-03-17 NOTE — Discharge Summary (Signed)
Physician Discharge Summary  Deborah Gray:[id number] DOB: Jun 09, 1933 DOA: 03/15/2021  PCP: Coral Spikes, DO  Admit date: 03/15/2021 Discharge date: 03/17/2021  Recommendations for Outpatient Follow-up:  Discharge to home. Follow up with PCP in 7-10 days. Follow up with Dr. Leonia Corona as directed. Complete antibiotics Continue q 4 week tube exchanges.  Discharge Diagnoses: Principal diagnosis is #1 Sepsis secondary to UTI (POA) Right sided nephrolithiasis with moderate to advanced right hydroureteronephrosis Leukocytosis Hyponatremia Hyperglycemia due to DM II Essential Hypertension Hyperlipidemia GERD   Discharge Condition: Fair  Disposition: Home  Diet recommendation: Modified carbohydrate  Filed Weights   03/16/21 0229  Weight: 62.1 kg    History of present illness:   Deborah Gray is a 88 y.o. female with medical history significant for hypertension, hyperlipidemia, T2DM who presents to the emergency department due to several day onset of lower abdominal pain.  Patient complained of about 5-week onset of blood in urine and she was following up with her urologist due to history of interstitial cystitis, the blood in urine improved and she now complains of several day onset of painful urination and lower abdominal pain.  She endorsed fever up to 102F today, she took Tylenol which seemed to improve the fever and she decided to go to the ED for further evaluation and management.  Urinalysis and urine culture done yesterday by urology was still pending, patient was started on antibiotics yesterday   ED Course:  In the emergency department, she was febrile with a temperature of 102F, BP 143/68 and was tachycardic, but other vital signs were within normal range.  Work-up in the ED showed normal CBC except for leukocytosis and normal BMP except for mild hyponatremia and hyperglycemia, lactic acid 1.5, urinalysis was positive for large leukocytes and >50 leukocytes.   Influenza A, B, SARS coronavirus 2 was negative. CT abdomen and pelvis with contrast showed obstructing 7 mm stone in the distal right ureter with moderate to advanced right hydroureteronephrosis. Patient was treated with IV ceftriaxone, IV fentanyl was given.  Urologist (Dr. Abner Greenspan) was consulted and recommended admitting patient to [facility] with possibility of stenting her tonight.  Hospital Course:  The patient underwent right retrograde pyelogram with interpretation, cystoscopy, and right ureteral stent placement with fluoroscopy and foley catheter placement with Dr. Abner Greenspan in the early morning hours of 03/16/2021.   The patient states that her pain in relieved. No new complaints.   She is received IV ceftriaxone and IV fluids.  The patient is appropriate for discharge to home.  Today's assessment: S: The patient is resting comfortably. No new complaints. O: Vitals:  Vitals:   03/16/21 2114 03/17/21 0406  BP: (!) 105/55 116/62  Pulse: 70 70  Resp: 16 16  Temp: 97.8 F (36.6 C) 97.8 F (36.6 C)  SpO2: 96% 95%    Exam:  Constitutional:  The patient is awake, alert, and oriented x 3. No acute distress. Respiratory:  No increased work of breathing. No wheezes, rales, or rhonchi No tactile fremitus Cardiovascular:  Regular rate and rhythm No murmurs, ectopy, or gallups. No lateral PMI. No thrills. Abdomen:  Abdomen is soft, non-tender, non-distended No hernias, masses, or organomegaly Normoactive bowel sounds.  Musculoskeletal:  No cyanosis, clubbing, or edema Skin:  No rashes, lesions, ulcers palpation of skin: no induration or nodules Neurologic:  CN 2-12 intact Sensation all 4 extremities intact Psychiatric:  Mental status Mood, affect appropriate Orientation to person, place, time  judgment and insight appear intact  Discharge Instructions  Discharge Instructions     Activity as tolerated - No restrictions   Complete by: As directed    Call MD for:   extreme fatigue   Complete by: As directed    Call MD for:  persistant dizziness or light-headedness   Complete by: As directed    Call MD for:  persistant nausea and vomiting   Complete by: As directed    Call MD for:  temperature >100.4   Complete by: As directed    Diet - low sodium heart healthy   Complete by: As directed    Discharge instructions   Complete by: As directed    Discharge to home. Follow up with PCP in 7-10 days. Follow up with Dr. Leonia Corona as directed. Complete antibiotics Continue q 4 week tube exchanges.   Increase activity slowly   Complete by: As directed       Allergies as of 03/17/2021       Reactions   Adhesive [tape] Other (See Comments)   Reaction:  Blisters    Estrogens Other (See Comments)   Reaction:  Migraines    Sulfonamide Derivatives Rash        Medication List     STOP taking these medications    nitrofurantoin (macrocrystal-monohydrate) 100 MG capsule Commonly known as: MACROBID   NON FORMULARY       TAKE these medications    acetaminophen 500 MG tablet Commonly known as: TYLENOL Take 500 mg by mouth at bedtime.   amoxicillin-clavulanate 875-125 MG tablet Commonly known as: Augmentin Take 1 tablet by mouth 2 (two) times daily for 10 days.   aspirin EC 81 MG tablet Take 81 mg by mouth at bedtime.   blood glucose meter kit and supplies Kit Dispense based on  insurance preference. Tests once a day E11.9   cholecalciferol 25 MCG (1000 UNIT) tablet Commonly known as: VITAMIN D3 Take 1,000 Units by mouth daily.   clidinium-chlordiazePOXIDE 5-2.5 MG capsule Commonly known as: LIBRAX Take 1 capsule by mouth 4 (four) times daily as needed (for esophageal symptoms). What changed: when to take this   clobetasol 0.05 % external solution Commonly known as: TEMOVATE 1 application daily as needed (itching).   Creon 24000-76000 units Cpep Generic drug: Pancrelipase (Lip-Prot-Amyl) TAKE 3 CAPSULES THREE TIMES DAILY  WITH MEALS AND ONE CAPSULE WITH SNACK TWICE DAILY What changed: See the new instructions.   Ester-C 500-60 MG Tabs Take 1 tablet by mouth daily.   ezetimibe 10 MG tablet Commonly known as: ZETIA TAKE 1 TABLET BY MOUTH EVERY DAY What changed: when to take this   guaiFENesin 600 MG 12 hr tablet Commonly known as: MUCINEX Take 1,200 mg by mouth daily as needed for cough.   hyoscyamine 0.125 MG SL tablet Commonly known as: LEVSIN SL USE 1 TABLET UNDER THE TONGUE BEFORE MEALS AND AT BEDTIME AS NEEDED FOR FLARES IN SYMPTOMS, diarrhea/abdominal cramping What changed:  how much to take how to take this when to take this reasons to take this additional instructions   ipratropium 0.06 % nasal spray Commonly known as: ATROVENT Place 1 spray into both nostrils daily.   ketoconazole 2 % cream Commonly known as: NIZORAL Apply 1 application topically daily as needed for irritation (to affected area. external use only). What changed: when to take this   losartan 50 MG tablet Commonly known as: COZAAR Take 1 tablet (50 mg total) by mouth daily.   meclizine 25 MG tablet Commonly known as: ANTIVERT TAKE  1 TABLET BY MOUTH 3 TIMES A DAY AS NEEDED What changed:  when to take this reasons to take this   metFORMIN 500 MG tablet Commonly known as: GLUCOPHAGE TAKE ONE TABLET BY MOUTH EVERY MORNING ONE AT LUNCH AND 2 AT BEDTIME What changed: See the new instructions.   methenamine 1 g tablet Commonly known as: HIPREX Take 1 tablet (1 g total) by mouth 2 (two) times daily.   mirabegron ER 50 MG Tb24 tablet Commonly known as: Myrbetriq Take 1 tablet (50 mg total) by mouth daily.   nateglinide 120 MG tablet Commonly known as: STARLIX TAKE 1 TABLET BY MOUTH 3 TIMES A DAY BEFORE MEALS What changed: See the new instructions.   omega-3 acid ethyl esters 1 g capsule Commonly known as: LOVAZA Take 2 g by mouth daily.   omeprazole 20 MG capsule Commonly known as: PRILOSEC TAKE 1  CAPSULE BY MOUTH EVERY DAY   PreserVision AREDS 2 Caps Take 1 capsule by mouth 2 (two) times daily.   Theratears 0.25 % Soln Generic drug: Carboxymethylcellulose Sodium Place 1 drop into both eyes 2 (two) times daily.       Allergies  Allergen Reactions   Adhesive [Tape] Other (See Comments)    Reaction:  Blisters    Estrogens Other (See Comments)    Reaction:  Migraines    Sulfonamide Derivatives Rash    The results of significant diagnostics from this hospitalization (including imaging, microbiology, ancillary and laboratory) are listed below for reference.    Significant Diagnostic Studies: CT ABDOMEN PELVIS W CONTRAST  Result Date: 03/15/2021 CLINICAL DATA:  Left lower quadrant pain. Patient reports fever today. EXAM: CT ABDOMEN AND PELVIS WITH CONTRAST TECHNIQUE: Multidetector CT imaging of the abdomen and pelvis was performed using the standard protocol following bolus administration of intravenous contrast. RADIATION DOSE REDUCTION: This exam was performed according to the departmental dose-optimization program which includes automated exposure control, adjustment of the mA and/or kV according to patient size and/or use of iterative reconstruction technique. CONTRAST:  135m OMNIPAQUE IOHEXOL 300 MG/ML  SOLN COMPARISON:  11/30/2014 FINDINGS: Lower chest: Bandlike linear atelectasis in the right left lower lobe. Mild compressive atelectasis in the right middle lobe. No pleural effusion or confluent consolidation. Hepatobiliary: Mild focal fatty infiltration adjacent to the gallbladder fossa and falciform ligament. No discrete liver lesion. Post cholecystectomy. Prominent common bile duct likely related to prior cholecystectomy. There is also mild intrahepatic biliary ductal dilatation no evidence of biliary obstruction. Pancreas: Parenchymal atrophy. No ductal dilatation or inflammation. Spleen: Normal in size without focal abnormality. Adrenals/Urinary Tract: No adrenal nodule.  Moderate to advanced right hydroureteronephrosis. Ureter is dilated to a 7 mm obstructing stone in the distal ureter, series 2, image 55. The more distal ureter is decompressed. There is only mild right perinephric edema. Slight decreased right renal enhancement. Calyceal diverticulum in the upper right kidney. Additional small scattered right renal cysts. There is no left hydronephrosis. Left renal cortical and parapelvic cysts. No left perinephric edema. Decompressed left ureter. Partially distended urinary bladder. Stomach/Bowel: Detailed bowel assessment is limited in the absence of enteric contrast and paucity of intra-abdominal fat. Nondistended stomach and duodenum. No small bowel obstruction or inflammation. There is fecalization of distal small bowel contents. Cecum is high-riding in the right mid abdomen. The appendix is not confidently visualized no evidence of appendicitis. Moderate volume of stool throughout the colon. There is colonic redundancy. Diverticulosis is prominent involving the descending and sigmoid colon. No acute diverticulitis or acute colonic inflammation.  Vascular/Lymphatic: Moderate aortic atherosclerosis with aortic tortuosity. No aortic aneurysm. Patent portal and splenic veins. No bulky abdominopelvic adenopathy. Reproductive: Status post hysterectomy. No adnexal masses. Other: No free air, free fluid, or intra-abdominal fluid collection. Minimal fat in the left inguinal canal. Musculoskeletal: Mild to moderate L1 superior endplate compression fracture has sclerotic margins and is likely chronic, however is new from prior exam. Scoliosis and degenerative change in the spine. No focal bone lesion or acute osseous abnormalities. IMPRESSION: 1. Obstructing 7 mm stone in the distal right ureter with moderate to advanced right hydroureteronephrosis. 2. Colonic diverticulosis without acute inflammation. 3. Mild to moderate L1 superior endplate compression fracture has sclerotic margins  and is likely chronic, however is new from prior exam. Recommend correlation for history of back pain. Aortic Atherosclerosis (ICD10-I70.0). Electronically Signed   By: Keith Rake M.D.   On: 03/15/2021 20:53   DG C-Arm 1-60 Min-No Report  Result Date: 03/16/2021 Fluoroscopy was utilized by the requesting physician.  No radiographic interpretation.    Microbiology: Recent Results (from the past 240 hour(s))  Microscopic Examination     Status: Abnormal   Collection Time: 03/14/21  3:43 PM   Urine  Result Value Ref Range Status   WBC, UA 11-30 (A) 0 - 5 /hpf Final   RBC >30 (A) 0 - 2 /hpf Final   Epithelial Cells (non renal) 0-10 0 - 10 /hpf Final   Renal Epithel, UA None seen None seen /hpf Final   Bacteria, UA None seen None seen/Few Final  Urine Culture     Status: Abnormal   Collection Time: 03/15/21  5:40 PM   Specimen: Urine, Clean Catch  Result Value Ref Range Status   Specimen Description   Final    URINE, CLEAN CATCH Performed at [facility], [address], [place], [place] [address]    Special Requests   Final    NONE Performed at [facility], [address] [place], [place] [address]    Culture (A)  Final    <10,000 COLONIES/mL INSIGNIFICANT GROWTH Performed at [facility], [place] [address] [place], [place] [address]    Report Status 03/17/2021 FINAL  Final  Resp Panel by RT-PCR (Flu A&B, Covid) Nasopharyngeal Swab     Status: None   Collection Time: 03/15/21  7:44 PM   Specimen: Nasopharyngeal Swab; Nasopharyngeal(NP) swabs in vial transport medium  Result Value Ref Range Status   SARS Coronavirus 2 by RT PCR NEGATIVE NEGATIVE Final    Comment: (NOTE) SARS-CoV-2 target nucleic acids are NOT DETECTED.  The SARS-CoV-2 RNA is generally detectable in upper respiratory specimens during the acute phase of infection. The lowest concentration of SARS-CoV-2 viral copies this assay can detect is 138 copies/mL. A negative result does not preclude  SARS-Cov-2 infection and should not be used as the sole basis for treatment or other patient management decisions. A negative result may occur with  improper specimen collection/handling, submission of specimen other than nasopharyngeal swab, presence of viral mutation(s) within the areas targeted by this assay, and inadequate number of viral copies(<138 copies/mL). A negative result must be combined with clinical observations, patient history, and epidemiological information. The expected result is Negative.  Fact Sheet for Patients:  EntrepreneurPulse.com.au  Fact Sheet for Healthcare Providers:  IncredibleEmployment.be  This test is no t yet approved or cleared by the [place] FDA and  has been authorized for detection and/or diagnosis of SARS-CoV-2 by FDA under an Emergency Use Authorization (EUA). This EUA will  remain  in effect (meaning this test can be used) for the duration of the COVID-19 declaration under Section 564(b)(1) of the Act, 21 U.S.C.section 360bbb-3(b)(1), unless the authorization is terminated  or revoked sooner.       Influenza A by PCR NEGATIVE NEGATIVE Final   Influenza B by PCR NEGATIVE NEGATIVE Final    Comment: (NOTE) The Xpert Xpress SARS-CoV-2/FLU/RSV plus assay is intended as an aid in the diagnosis of influenza from Nasopharyngeal swab specimens and should not be used as a sole basis for treatment. Nasal washings and aspirates are unacceptable for Xpert Xpress SARS-CoV-2/FLU/RSV testing.  Fact Sheet for Patients: EntrepreneurPulse.com.au  Fact Sheet for Healthcare Providers: IncredibleEmployment.be  This test is not yet approved or cleared by the [place] FDA and has been authorized for detection and/or diagnosis of SARS-CoV-2 by FDA under an Emergency Use Authorization (EUA). This EUA will remain in effect (meaning this test can be used) for the duration of  the COVID-19 declaration under Section 564(b)(1) of the Act, 21 U.S.C. section 360bbb-3(b)(1), unless the authorization is terminated or revoked.  Performed at [facility], [address], [place], [place] [address]   Culture, blood (routine x 2)     Status: None (Preliminary result)   Collection Time: 03/15/21  7:47 PM   Specimen: BLOOD RIGHT ARM  Result Value Ref Range Status   Specimen Description BLOOD RIGHT ARM  Final   Special Requests   Final    BOTTLES DRAWN AEROBIC AND ANAEROBIC Blood Culture results may not be optimal due to an inadequate volume of blood received in culture bottles   Culture   Final    NO GROWTH 2 DAYS Performed at [facility], [address], [place], [place] [address]    Report Status PENDING  Incomplete  Culture, blood (routine x 2)     Status: None (Preliminary result)   Collection Time: 03/15/21  7:47 PM   Specimen: Left Antecubital; Blood  Result Value Ref Range Status   Specimen Description LEFT ANTECUBITAL  Final   Special Requests   Final    BOTTLES DRAWN AEROBIC AND ANAEROBIC Blood Culture results may not be optimal due to an excessive volume of blood received in culture bottles   Culture   Final    NO GROWTH 2 DAYS Performed at [facility], [address], [place], [place] [address]    Report Status PENDING  Incomplete  Urine Culture     Status: None (Preliminary result)   Collection Time: 03/16/21  4:28 AM   Specimen: Kidney  Result Value Ref Range Status   Specimen Description   Final    KIDNEY RT RENAL Performed at [facility], [place] [address] [place], [place] [address]    Special Requests   Final    NONE Performed at [facility], [place] [address] [place],  [address]    Culture PENDING  Incomplete   Report Status PENDING  Incomplete     Labs: Basic Metabolic Panel: Recent Labs  Lab 03/15/21 1803 03/16/21 0310 03/17/21 0303  NA 133* 135 131*  K 3.7 3.3* 3.9  CL 101 101 101  CO2 _0 GLUCOSE 171* 182* 115*  BUN 21 19 31*  CREATININE 0.89 0.70 0.80  CALCIUM 9.0 8.9 8.9  MG  --  1.8  --   PHOS  --  2.7  --    Liver Function Tests: Recent Labs  Lab 03/16/21 0310  AST 10*  ALT 13  ALKPHOS 52  BILITOT 0.5  PROT 6.8  ALBUMIN 3.4*   No results for input(s): LIPASE, AMYLASE in the last 168 hours. No results for input(s): AMMONIA in the last 168 hours. CBC: Recent Labs  Lab 03/15/21 1803 03/16/21 0310 03/17/21 0303  WBC 14.5* 13.6* 11.9*  NEUTROABS 12.3*  --  8.8*  HGB 13.5 13.3 11.3*  HCT 41.5 42.1 34.3*  MCV 92.0 90.9 90.0  PLT 289 247 227   Cardiac Enzymes: No results for input(s): CKTOTAL, CKMB, CKMBINDEX, TROPONINI in the last 168 hours. BNP: BNP (last 3 results) No results for input(s): BNP in the last 8760 hours.  ProBNP (last 3 results) No results for input(s): PROBNP in the last 8760 hours.  CBG: Recent Labs  Lab 03/16/21 2111 03/16/21 2333 03/17/21 0404 03/17/21 0746 03/17/21 1114  GLUCAP 163* 128* 116* 113* 142*    Principal Problem:   UTI (urinary tract infection) Active Problems:   Essential hypertension   Hyperlipidemia, unspecified   Acute lower UTI   Nephrolithiasis   Hyperglycemia due to diabetes mellitus ([place])   Leukocytosis   Hyponatremia   Sepsis ([place])   Time coordinating discharge: 38 minutes  Signed:        Kalandra Masters, DO [facility]  03/17/2021, 3:27 PM

## 2021-03-17 NOTE — Plan of Care (Signed)
°  Problem: Clinical Measurements: Goal: Ability to maintain clinical measurements within normal limits will improve Outcome: Progressing   Problem: Clinical Measurements: Goal: Will remain free from infection Outcome: Progressing   Problem: Safety: Goal: Ability to remain free from injury will improve Outcome: Progressing

## 2021-03-17 NOTE — Plan of Care (Signed)
Discharge instructions given to the patient including medications. ?

## 2021-03-19 ENCOUNTER — Telehealth: Payer: Self-pay

## 2021-03-19 MED ORDER — AMOXICILLIN-POT CLAVULANATE 875-125 MG PO TABS: 1.0000 | ORAL_TABLET | Freq: Two times a day (BID) | ORAL | 0 refills | Status: AC

## 2021-03-19 NOTE — Telephone Encounter (Signed)
Transition Care Management Unsuccessful Follow-up Telephone Call  Date of discharge and from where:  03/17/21 [facility]  Diagnosis: Kidney Stone, UTI   Attempts:  1st Attempt  Reason for unsuccessful TCM follow-up call:  Left voice message

## 2021-03-19 NOTE — Telephone Encounter (Signed)
Transitions of Care Note.  D/C 03/17/21 [facility]  Dx: Kidney Stone/ UTI  2nd attempt. Tried home and cell #. No answer at home # and LM on cell #.

## 2021-03-19 NOTE — Telephone Encounter (Signed)
Patient called office this am. Patient recently dc'd home from hospital with Augmentin prescription.  Patient states she has lost her prescription. Reviewed with Sharee Pimple, [place]. Reordered medication for patient.

## 2021-03-20 ENCOUNTER — Telehealth: Payer: Self-pay | Admitting: *Deleted

## 2021-03-20 DIAGNOSIS — K861 Other chronic pancreatitis: Secondary | ICD-10-CM

## 2021-03-20 DIAGNOSIS — H34832 Tributary (branch) retinal vein occlusion, left eye, with macular edema: Secondary | ICD-10-CM | POA: Diagnosis not present

## 2021-03-20 LAB — CULTURE, BLOOD (ROUTINE X 2)
Culture: NO GROWTH
Culture: NO GROWTH

## 2021-03-20 NOTE — Telephone Encounter (Signed)
Received refill request for creon 24000 unit capsule, take 3 caps TID with meals and 1 cap with snack twice a day,  #[phone]

## 2021-03-21 ENCOUNTER — Telehealth: Payer: Self-pay

## 2021-03-21 NOTE — Addendum Note (Signed)
Addended by: Annitta Needs on: 03/21/2021 11:37 AM   Modules accepted: Orders

## 2021-03-21 NOTE — Telephone Encounter (Signed)
Transition Care Management Follow-up Telephone Call Date of discharge and from where: 03/17/2021, [facility] How have you been since you were released from the hospital? Pt states she is doing, "some better."  Any questions or concerns? No  Items Reviewed: Did the pt receive and understand the discharge instructions provided? Yes  Medications obtained and verified? Yes  Other? No  Any new allergies since your discharge? No  Dietary orders reviewed? Yes Do you have support at home? Yes   Home Care and Equipment/Supplies: Were home health services ordered? no If so, what is the name of the agency? N/A  Has the agency set up a time to come to the patient's home? not applicable Were any new equipment or medical supplies ordered?  No What is the name of the medical supply agency? N/A Were you able to get the supplies/equipment? not applicable Do you have any questions related to the use of the equipment or supplies? No  Functional Questionnaire: (I = Independent and D = Dependent) ADLs: I  Bathing/Dressing- I  Meal Prep- I  Eating- I  Maintaining continence- I  Transferring/Ambulation- I  Managing Meds- I  Follow up appointments reviewed:  PCP Hospital f/u appt confirmed? Yes  Scheduled to see Dr. Lacinda Axon on 03/27/21 @ 10. [facility] f/u appt confirmed? Yes  Scheduled to see Dr. Leonia Corona on 03/23/21 @ 9:45. Are transportation arrangements needed? No  If their condition worsens, is the pt aware to call PCP or go to the Emergency Dept.? Yes Was the patient provided with contact information for the PCP's office or ED? Yes Was to pt encouraged to call back with questions or concerns? Yes

## 2021-03-23 ENCOUNTER — Ambulatory Visit (INDEPENDENT_AMBULATORY_CARE_PROVIDER_SITE_OTHER): Payer: Medicare Other | Admitting: Urology

## 2021-03-23 ENCOUNTER — Encounter: Payer: Self-pay | Admitting: Urology

## 2021-03-23 ENCOUNTER — Other Ambulatory Visit: Payer: Self-pay

## 2021-03-23 VITALS — BP 165/72 | HR 86

## 2021-03-23 DIAGNOSIS — R319 Hematuria, unspecified: Secondary | ICD-10-CM

## 2021-03-23 DIAGNOSIS — N3010 Interstitial cystitis (chronic) without hematuria: Secondary | ICD-10-CM | POA: Diagnosis not present

## 2021-03-23 LAB — MICROSCOPIC EXAMINATION
Epithelial Cells (non renal): NONE SEEN /hpf (ref 0–10)
RBC, Urine: >30 /hpf — AB (ref 0–2)
Renal Epithel, UA: NONE SEEN /hpf

## 2021-03-23 LAB — URINALYSIS, ROUTINE W REFLEX MICROSCOPIC
Bilirubin, UA: NEGATIVE
Glucose, UA: NEGATIVE
Ketones, UA: NEGATIVE
Nitrite, UA: NEGATIVE
Specific Gravity, UA: 1.025 (ref 1.005–1.030)
Urobilinogen, Ur: 0.2 mg/dL (ref 0.2–1.0)
pH, UA: 6 (ref 5.0–7.5)

## 2021-03-23 NOTE — Progress Notes (Signed)
03/23/2021 9:57 AM   Deborah Gray 10-Oct-1933 [id number]  Referring provider: Tommie Sams, DO [address] [place],  [place] [address]  Nephrolithiasis  HPI: Deborah Gray is a 87yo here for followup for nephrolithiasis. She underwent right ureteral stent placement 1 week ago for UTI and a 67mm right distal ureteral calculus. She has intermittent right flank pain. She has urinary frequency, urgency, and dysuria with the stent in place   PMH: Past Medical History:  Diagnosis Date   Arthritis    Benign neoplasm of colon    Constipation    Diaphragmatic hernia without mention of obstruction or gangrene    Diverticulosis of colon (without mention of hemorrhage)    Dysphagia, pharyngoesophageal phase    Esophageal reflux    Essential hypertension    Heart murmur    Hemorrhoids    Hyperlipidemia    Internal hemorrhoids without mention of complication    Left wrist fracture    PONV (postoperative nausea and vomiting)    Stricture and stenosis of esophagus    Type 2 diabetes mellitus (HCC)    Unspecified gastritis and gastroduodenitis without mention of hemorrhage     Surgical History: Past Surgical History:  Procedure Laterality Date   ABDOMINAL HYSTERECTOMY     BACTERIAL OVERGROWTH TEST N/A 09/06/2015   Procedure: BACTERIAL OVERGROWTH TEST;  Surgeon: Corbin Ade, MD;  Location: [facility];  Service: Endoscopy;  Laterality: N/A;  800   BREAST LUMPECTOMY Right    benign   CATARACT EXTRACTION Bilateral 2006   Implants in both, surgeries done 6 weeks apart   CHOLECYSTECTOMY     COLONOSCOPY  03/08/02   Jarold Motto: diverticulosis, internal hemorrhoids, adenomatous colon polyp   COLONOSCOPY  01/23/04   Jarold Motto: diverticulosis, internal hemorrhoids   COLONOSCOPY  09/25/05   Jarold Motto: diverticulosis   COLONOSCOPY  07/05/09   Jarold Motto: severe diverticulosis   COLONOSCOPY  01/21/11   Jarold Motto: severe diverticulosis in sigmoid to desc colon, int hemorrhoids, follow  up TCS in 5 years   COLONOSCOPY N/A 04/10/2016   [name]: diverticulosis   CYSTOSCOPY W/ URETERAL STENT PLACEMENT Right 03/15/2021   Procedure: CYSTOSCOPY WITH RETROGRADE PYELOGRAM/URETERAL STENT PLACEMENT;  Surgeon: Jannifer Hick, MD;  Location: [facility];  Service: Urology;  Laterality: Right;   DILATION AND CURETTAGE OF UTERUS     ESOPHAGEAL MANOMETRY  03/21/08   Patterson: findings c/w [place] Esophagus   ESOPHAGOGASTRODUODENOSCOPY  03/08/02   Jarold Motto: esophageal stricture, chronic gerd, s/p dilation.    ESOPHAGOGASTRODUODENOSCOPY  01/23/04   Jarold Motto: esophageal stricture, gastritis, hiatal hernia   ESOPHAGOGASTRODUODENOSCOPY  09/25/05   Jarold Motto: gastritis, benign bx, no H.pylori   ESOPHAGOGASTRODUODENOSCOPY  07/05/09   Jarold Motto: gastropathy, benign small bowel bx and gastric bx   ESOPHAGOGASTRODUODENOSCOPY N/A 05/15/2015   Dr. Jena Gauss- diffuse moderate inflammation characterized by congestion (edema), erythema, and linear erosions was found in the entire examined stomach. bx= reactive gastropathy   FOOT SURGERY Left    HEMORRHOID BANDING  2017   Dr.[name]   LAPAROSCOPIC VAGINAL HYSTERECTOMY     ORIF WRIST FRACTURE Left 06/23/2018   Procedure: OPEN REDUCTION INTERNAL FIXATION (ORIF) LEFT WRIST FRACTURE;  Surgeon: Sheral Apley, MD;  Location: [facility];  Service: Orthopedics;  Laterality: Left;  regional arm block   RECTOCELE REPAIR     TOTAL KNEE ARTHROPLASTY Right     Home Medications:  Allergies as of 03/23/2021       Reactions   Adhesive [tape] Other (See Comments)  Reaction:  Blisters    Estrogens Other (See Comments)   Reaction:  Migraines    Sulfonamide Derivatives Rash        Medication List        Accurate as of March 23, 2021  9:57 AM. If you have any questions, ask your nurse or doctor.          acetaminophen 500 MG tablet Commonly known as: TYLENOL Take 500 mg by mouth at bedtime.   amoxicillin-clavulanate 875-125 MG tablet Commonly  known as: Augmentin Take 1 tablet by mouth 2 (two) times daily for 10 days.   aspirin EC 81 MG tablet Take 81 mg by mouth at bedtime.   blood glucose meter kit and supplies Kit Dispense based on  insurance preference. Tests once a day E11.9   cholecalciferol 25 MCG (1000 UNIT) tablet Commonly known as: VITAMIN D3 Take 1,000 Units by mouth daily.   clidinium-chlordiazePOXIDE 5-2.5 MG capsule Commonly known as: LIBRAX Take 1 capsule by mouth 4 (four) times daily as needed (for esophageal symptoms). What changed: when to take this   clobetasol 0.05 % external solution Commonly known as: TEMOVATE 1 application daily as needed (itching).   Creon 24000-76000 units Cpep Generic drug: Pancrelipase (Lip-Prot-Amyl) TAKE 3 CAPSULES THREE TIMES DAILY WITH MEALS AND ONE CAPSULE WITH SNACK TWICE DAILY What changed: See the new instructions.   Ester-C 500-60 MG Tabs Take 1 tablet by mouth daily.   ezetimibe 10 MG tablet Commonly known as: ZETIA TAKE 1 TABLET BY MOUTH EVERY DAY What changed: when to take this   guaiFENesin 600 MG 12 hr tablet Commonly known as: MUCINEX Take 1,200 mg by mouth daily as needed for cough.   hyoscyamine 0.125 MG SL tablet Commonly known as: LEVSIN SL USE 1 TABLET UNDER THE TONGUE BEFORE MEALS AND AT BEDTIME AS NEEDED FOR FLARES IN SYMPTOMS, diarrhea/abdominal cramping What changed:  how much to take how to take this when to take this reasons to take this additional instructions   ipratropium 0.06 % nasal spray Commonly known as: ATROVENT Place 1 spray into both nostrils daily.   ketoconazole 2 % cream Commonly known as: NIZORAL Apply 1 application topically daily as needed for irritation (to affected area. external use only). What changed: when to take this   losartan 50 MG tablet Commonly known as: COZAAR Take 1 tablet (50 mg total) by mouth daily.   meclizine 25 MG tablet Commonly known as: ANTIVERT TAKE 1 TABLET BY MOUTH 3 TIMES A DAY AS  NEEDED What changed:  when to take this reasons to take this   metFORMIN 500 MG tablet Commonly known as: GLUCOPHAGE TAKE ONE TABLET BY MOUTH EVERY MORNING ONE AT LUNCH AND 2 AT BEDTIME What changed: See the new instructions.   methenamine 1 g tablet Commonly known as: HIPREX Take 1 tablet (1 g total) by mouth 2 (two) times daily.   mirabegron ER 50 MG Tb24 tablet Commonly known as: Myrbetriq Take 1 tablet (50 mg total) by mouth daily.   nateglinide 120 MG tablet Commonly known as: STARLIX TAKE 1 TABLET BY MOUTH 3 TIMES A DAY BEFORE MEALS What changed: See the new instructions.   omega-3 acid ethyl esters 1 g capsule Commonly known as: LOVAZA Take 2 g by mouth daily.   omeprazole 20 MG capsule Commonly known as: PRILOSEC TAKE 1 CAPSULE BY MOUTH EVERY DAY   PreserVision AREDS 2 Caps Take 1 capsule by mouth 2 (two) times daily.   Theratears 0.25 %  Soln Generic drug: Carboxymethylcellulose Sodium Place 1 drop into both eyes 2 (two) times daily.        Allergies:  Allergies  Allergen Reactions   Adhesive [Tape] Other (See Comments)    Reaction:  Blisters    Estrogens Other (See Comments)    Reaction:  Migraines    Sulfonamide Derivatives Rash    Family History: Family History  Problem Relation Age of Onset   Ovarian cancer Mother    Diabetes Father    Heart disease Father    Breast cancer Sister    Liver cancer Sister    Colon cancer Cousin        Paternal side   Diabetes Maternal Aunt    Liver disease Cousin        Maternal side, never drank    Social History:  reports that she has never smoked. She has never used smokeless tobacco. She reports that she does not drink alcohol and does not use drugs.  ROS: All other review of systems were reviewed and are negative except what is noted above in HPI  Physical Exam: BP (!) 165/72    Pulse 86   Constitutional:  Alert and oriented, No acute distress. HEENT: [place] AT, moist mucus membranes.  Trachea  midline, no masses. Cardiovascular: No clubbing, cyanosis, or edema. Respiratory: Normal respiratory effort, no increased work of breathing. GI: Abdomen is soft, nontender, nondistended, no abdominal masses GU: No CVA tenderness.  Lymph: No cervical or inguinal lymphadenopathy. Skin: No rashes, bruises or suspicious lesions. Neurologic: Grossly intact, no focal deficits, moving all 4 extremities. Psychiatric: Normal mood and affect.  Laboratory Data: Lab Results  Component Value Date   WBC 11.9 (H) 03/17/2021   HGB 11.3 (L) 03/17/2021   HCT 34.3 (L) 03/17/2021   MCV 90.0 03/17/2021   PLT 227 03/17/2021    Lab Results  Component Value Date   CREATININE 0.80 03/17/2021    No results found for: PSA  No results found for: TESTOSTERONE  Lab Results  Component Value Date   HGBA1C 6.1 (H) 03/15/2021    Urinalysis    Component Value Date/Time   COLORURINE YELLOW 03/15/2021 1740   APPEARANCEUR HAZY (A) 03/15/2021 1740   APPEARANCEUR Cloudy (A) 03/14/2021 1543   LABSPEC 1.015 03/15/2021 1740   PHURINE 5.0 03/15/2021 1740   GLUCOSEU NEGATIVE 03/15/2021 1740   HGBUR NEGATIVE 03/15/2021 1740   BILIRUBINUR NEGATIVE 03/15/2021 1740   BILIRUBINUR Negative 03/14/2021 1543   KETONESUR NEGATIVE 03/15/2021 1740   PROTEINUR 30 (A) 03/15/2021 1740   UROBILINOGEN 0.2 08/18/2019 1326   UROBILINOGEN 0.2 04/08/2008 1454   NITRITE NEGATIVE 03/15/2021 1740   LEUKOCYTESUR LARGE (A) 03/15/2021 1740    Lab Results  Component Value Date   LABMICR See below: 03/14/2021   WBCUA 11-30 (A) 03/14/2021   LABEPIT 0-10 03/14/2021   MUCUS Present 11/16/2020   BACTERIA RARE (A) 03/15/2021    Pertinent Imaging: CT 03/15/2021: Images reviewed and discussed with the patiet No results found for this or any previous visit.  No results found for this or any previous visit.  No results found for this or any previous visit.  No results found for this or any previous visit.  No results found for  this or any previous visit.  No results found for this or any previous visit.  No results found for this or any previous visit.  No results found for this or any previous visit.   Assessment & Plan:    1.  Nephrolithiasis -We discussed the management of kidney stones. These options include observation, ureteroscopy, shockwave lithotripsy (ESWL) and percutaneous nephrolithotomy (PCNL). We discussed which options are relevant to the patient's stone(s). We discussed the natural history of kidney stones as well as the complications of untreated stones and the impact on quality of life without treatment as well as with each of the above listed treatments. We also discussed the efficacy of each treatment in its ability to clear the stone burden. With any of these management options I discussed the signs and symptoms of infection and the need for emergent treatment should these be experienced. For each option we discussed the ability of each procedure to clear the patient of their stone burden.   For observation I described the risks which include but are not limited to silent renal damage, life-threatening infection, need for emergent surgery, failure to pass stone and pain.   For ureteroscopy I described the risks which include bleeding, infection, damage to contiguous structures, positioning injury, ureteral stricture, ureteral avulsion, ureteral injury, need for prolonged ureteral stent, inability to perform ureteroscopy, need for an interval procedure, inability to clear stone burden, stent discomfort/pain, heart attack, stroke, pulmonary embolus and the inherent risks with general anesthesia.   For shockwave lithotripsy I described the risks which include arrhythmia, kidney contusion, kidney hemorrhage, need for transfusion, pain, inability to adequately break up stone, inability to pass stone fragments, Steinstrasse, infection associated with obstructing stones, need for alternate surgical  procedure, need for repeat shockwave lithotripsy, MI, CVA, PE and the inherent risks with anesthesia/conscious sedation.   For PCNL I described the risks including positioning injury, pneumothorax, hydrothorax, need for chest tube, inability to clear stone burden, renal laceration, arterial venous fistula or malformation, need for embolization of kidney, loss of kidney or renal function, need for repeat procedure, need for prolonged nephrostomy tube, ureteral avulsion, MI, CVA, PE and the inherent risks of general anesthesia.   - The patient would like to proceed with right ureteroscopic stone extraction - Urinalysis, Routine w reflex microscopic   No follow-ups on file.  [name], MD  [facility] [place]

## 2021-03-23 NOTE — Progress Notes (Signed)

## 2021-03-23 NOTE — Patient Instructions (Signed)
Ureteroscopy °Ureteroscopy is a procedure to check for and treat problems inside part of the urinary tract. In this procedure, a thin, flexible tube with a light at the end (ureteroscope) is used to look at the inside of the kidneys and the ureters. The ureters are the tubes that carry urine from the kidneys to the bladder. The ureteroscope is inserted into one or both of the ureters. °You may need this procedure if you have frequent urinary tract infections (UTIs), blood in your urine, or a stone in one of your ureters. A ureteroscopy can be done: °To find the cause of urine blockage in a ureter and to evaluate other abnormalities inside the ureters or kidneys. °To remove stones. °To remove or treat growths of tissue (polyps), abnormal tissue, and some types of tumors. °To remove a tissue sample and check it for disease under a microscope (biopsy). °Tell a health care provider about: °Any allergies you have. °All medicines you are taking, including vitamins, herbs, eye drops, creams, and over-the-counter medicines. °Any problems you or family members have had with anesthetic medicines. °Any blood disorders you have. °Any surgeries you have had. °Any medical conditions you have. °Whether you are pregnant or may be pregnant. °What are the risks? °Generally, this is a safe procedure. However, problems may occur, including: °Bleeding. °Infection. °Allergic reactions to medicines. °Scarring that narrows the ureter (stricture). °Creating a hole in the ureter (perforation). °What happens before the procedure? °Staying hydrated °Follow instructions from your health care provider about hydration, which may include: °Up to 2 hours before the procedure - you may continue to drink clear liquids, such as water, clear fruit juice, black coffee, and plain tea. ° °Eating and drinking restrictions °Follow instructions from your health care provider about eating and drinking, which may include: °8 hours before the procedure - stop  eating heavy meals or foods, such as meat, fried foods, or fatty foods. °6 hours before the procedure - stop eating light meals or foods, such as toast or cereal. °6 hours before the procedure - stop drinking milk or drinks that contain milk. °2 hours before the procedure - stop drinking clear liquids. °Medicines °Ask your health care provider about: °Changing or stopping your regular medicines. This is especially important if you are taking diabetes medicines or blood thinners. °Taking medicines such as aspirin and ibuprofen. These medicines can thin your blood. Do not take these medicines unless your health care provider tells you to take them. °Taking over-the-counter medicines, vitamins, herbs, and supplements. °General instructions °Do not use any products that contain nicotine or tobacco for at least 4 weeks before the procedure. These products include cigarettes, e-cigarettes, and chewing tobacco. If you need help quitting, ask your health care provider. °You may have a urine sample taken to check for infection. °Plan to have someone take you home from the hospital or clinic. °If you will be going home right after the procedure, plan to have someone with you for 24 hours. °Ask your health care provider what steps will be taken to help prevent infection. These may include: °Washing skin with a germ-killing soap. °Receiving antibiotic medicine. °What happens during the procedure? ° °An IV will be inserted into one of your veins. °You will be given one or more of the following: °A medicine to help you relax (sedative). °A medicine to make you fall asleep (general anesthetic). °A medicine that is injected into your spine to numb the area below and slightly above the injection site (spinal anesthetic). °The   part of your body that drains urine from your bladder (urethra) will be cleaned with a germ-killing solution. °The ureteroscope will be passed through your urethra into your bladder. °A salt-water solution will  be sent through the ureteroscope to fill your bladder. This will help the health care provider see the openings of your ureters more clearly. °The ureteroscope will be passed into your ureter. °If a growth is found, a biopsy may be done. °If a stone is found, it may be removed through the ureteroscope, or the stone may be broken up using a laser, shock waves, or electrical energy. °In some cases, if the ureter is too small, a tube may be inserted that keeps the ureter open (ureteral stent). The stent may be left in place for 1 or 2 weeks to keep the ureter open, and then the ureteroscopy procedure will be done. °The scope will be removed, and your bladder will be emptied. °The procedure may vary among health care providers and hospitals. °What can I expect after the procedure? °After your procedure, it is common to have: °Your blood pressure, heart rate, breathing rate, and blood oxygen level monitored until you leave the hospital or clinic. °A burning sensation when you urinate. You may be asked to urinate. °Blood in your urine. °Mild discomfort in your bladder area or kidney area when urinating. °A need to urinate more often or urgently. °Follow these instructions at home: °Medicines °Take over-the-counter and prescription medicines only as told by your health care provider. °If you were prescribed an antibiotic medicine, take it as told by your health care provider. Do not stop taking the antibiotic even if you start to feel better. °General instructions ° °If you were given a sedative during the procedure, it can affect you for several hours. Do not drive or operate machinery until your health care provider says that it is safe. °To relieve burning, take a warm bath or hold a warm washcloth over your groin. °Drink enough fluid to keep your urine pale yellow. °Drink two 8-ounce (237 mL) glasses of water every hour for the first 2 hours after you get home. °Continue to drink water often at home. °You can eat what  you normally do. °Keep all follow-up visits as told by your health care provider. This is important. °If you had a ureteral stent placed, ask your health care provider when you need to return to have it removed. °Contact a health care provider if you have: °Chills or a fever. °Burning pain for longer than 24 hours after the procedure. °Blood in your urine for longer than 24 hours after the procedure. °Get help right away if you have: °Large amounts of blood in your urine. °Blood clots in your urine. °Severe pain. °Chest pain or trouble breathing. °The feeling of a full bladder and you are unable to urinate. °These symptoms may represent a serious problem that is an emergency. Do not wait to see if the symptoms will go away. Get medical help right away. Call your local emergency services (911 in the [place] °Summary °Ureteroscopy is a procedure to check for and treat problems inside part of the urinary tract. °In this procedure, a thin, flexible tube with a light at the end (ureteroscope) is used to look at the inside of the kidneys and the ureters. °You may need this procedure if you have frequent urinary tract infections (UTIs), blood in your urine, or a stone in a ureter. °This information is not intended to replace advice given to   you by your health care provider. Make sure you discuss any questions you have with your health care provider. °Document Revised: 10/18/2020 Document Reviewed: 11/11/2018 °Elsevier Patient Education © 2022 [employer] ° °

## 2021-03-23 NOTE — Telephone Encounter (Signed)
Phoned the pt, no ans/machine to leave a message.

## 2021-03-23 NOTE — H&P (View-Only) (Signed)
03/23/2021 9:57 AM   Deborah Gray Oct 29, 1933 [id number]  Referring provider: Tommie Sams, DO [address] [place],  [place] [address]  Nephrolithiasis  HPI: Deborah Gray is a 87yo here for followup for nephrolithiasis. She underwent right ureteral stent placement 1 week ago for UTI and a 77mm right distal ureteral calculus. She has intermittent right flank pain. She has urinary frequency, urgency, and dysuria with the stent in place   PMH: Past Medical History:  Diagnosis Date   Arthritis    Benign neoplasm of colon    Constipation    Diaphragmatic hernia without mention of obstruction or gangrene    Diverticulosis of colon (without mention of hemorrhage)    Dysphagia, pharyngoesophageal phase    Esophageal reflux    Essential hypertension    Heart murmur    Hemorrhoids    Hyperlipidemia    Internal hemorrhoids without mention of complication    Left wrist fracture    PONV (postoperative nausea and vomiting)    Stricture and stenosis of esophagus    Type 2 diabetes mellitus (HCC)    Unspecified gastritis and gastroduodenitis without mention of hemorrhage     Surgical History: Past Surgical History:  Procedure Laterality Date   ABDOMINAL HYSTERECTOMY     BACTERIAL OVERGROWTH TEST N/A 09/06/2015   Procedure: BACTERIAL OVERGROWTH TEST;  Surgeon: Corbin Ade, MD;  Location: [facility];  Service: Endoscopy;  Laterality: N/A;  800   BREAST LUMPECTOMY Right    benign   CATARACT EXTRACTION Bilateral 2006   Implants in both, surgeries done 6 weeks apart   CHOLECYSTECTOMY     COLONOSCOPY  03/08/02   Jarold Motto: diverticulosis, internal hemorrhoids, adenomatous colon polyp   COLONOSCOPY  01/23/04   Jarold Motto: diverticulosis, internal hemorrhoids   COLONOSCOPY  09/25/05   Jarold Motto: diverticulosis   COLONOSCOPY  07/05/09   Jarold Motto: severe diverticulosis   COLONOSCOPY  01/21/11   Jarold Motto: severe diverticulosis in sigmoid to desc colon, int hemorrhoids, follow  up TCS in 5 years   COLONOSCOPY N/A 04/10/2016   [name]: diverticulosis   CYSTOSCOPY W/ URETERAL STENT PLACEMENT Right 03/15/2021   Procedure: CYSTOSCOPY WITH RETROGRADE PYELOGRAM/URETERAL STENT PLACEMENT;  Surgeon: Jannifer Hick, MD;  Location: [facility];  Service: Urology;  Laterality: Right;   DILATION AND CURETTAGE OF UTERUS     ESOPHAGEAL MANOMETRY  03/21/08   Patterson: findings c/w [place] Esophagus   ESOPHAGOGASTRODUODENOSCOPY  03/08/02   Jarold Motto: esophageal stricture, chronic gerd, s/p dilation.    ESOPHAGOGASTRODUODENOSCOPY  01/23/04   Jarold Motto: esophageal stricture, gastritis, hiatal hernia   ESOPHAGOGASTRODUODENOSCOPY  09/25/05   Jarold Motto: gastritis, benign bx, no H.pylori   ESOPHAGOGASTRODUODENOSCOPY  07/05/09   Jarold Motto: gastropathy, benign small bowel bx and gastric bx   ESOPHAGOGASTRODUODENOSCOPY N/A 05/15/2015   Dr. Jena Gauss- diffuse moderate inflammation characterized by congestion (edema), erythema, and linear erosions was found in the entire examined stomach. bx= reactive gastropathy   FOOT SURGERY Left    HEMORRHOID BANDING  2017   Dr.[name]   LAPAROSCOPIC VAGINAL HYSTERECTOMY     ORIF WRIST FRACTURE Left 06/23/2018   Procedure: OPEN REDUCTION INTERNAL FIXATION (ORIF) LEFT WRIST FRACTURE;  Surgeon: Sheral Apley, MD;  Location: [facility];  Service: Orthopedics;  Laterality: Left;  regional arm block   RECTOCELE REPAIR     TOTAL KNEE ARTHROPLASTY Right     Home Medications:  Allergies as of 03/23/2021       Reactions   Adhesive [tape] Other (See Comments)  Reaction:  Blisters    Estrogens Other (See Comments)   Reaction:  Migraines    Sulfonamide Derivatives Rash        Medication List        Accurate as of March 23, 2021  9:57 AM. If you have any questions, ask your nurse or doctor.          acetaminophen 500 MG tablet Commonly known as: TYLENOL Take 500 mg by mouth at bedtime.   amoxicillin-clavulanate 875-125 MG tablet Commonly  known as: Augmentin Take 1 tablet by mouth 2 (two) times daily for 10 days.   aspirin EC 81 MG tablet Take 81 mg by mouth at bedtime.   blood glucose meter kit and supplies Kit Dispense based on  insurance preference. Tests once a day E11.9   cholecalciferol 25 MCG (1000 UNIT) tablet Commonly known as: VITAMIN D3 Take 1,000 Units by mouth daily.   clidinium-chlordiazePOXIDE 5-2.5 MG capsule Commonly known as: LIBRAX Take 1 capsule by mouth 4 (four) times daily as needed (for esophageal symptoms). What changed: when to take this   clobetasol 0.05 % external solution Commonly known as: TEMOVATE 1 application daily as needed (itching).   Creon 24000-76000 units Cpep Generic drug: Pancrelipase (Lip-Prot-Amyl) TAKE 3 CAPSULES THREE TIMES DAILY WITH MEALS AND ONE CAPSULE WITH SNACK TWICE DAILY What changed: See the new instructions.   Ester-C 500-60 MG Tabs Take 1 tablet by mouth daily.   ezetimibe 10 MG tablet Commonly known as: ZETIA TAKE 1 TABLET BY MOUTH EVERY DAY What changed: when to take this   guaiFENesin 600 MG 12 hr tablet Commonly known as: MUCINEX Take 1,200 mg by mouth daily as needed for cough.   hyoscyamine 0.125 MG SL tablet Commonly known as: LEVSIN SL USE 1 TABLET UNDER THE TONGUE BEFORE MEALS AND AT BEDTIME AS NEEDED FOR FLARES IN SYMPTOMS, diarrhea/abdominal cramping What changed:  how much to take how to take this when to take this reasons to take this additional instructions   ipratropium 0.06 % nasal spray Commonly known as: ATROVENT Place 1 spray into both nostrils daily.   ketoconazole 2 % cream Commonly known as: NIZORAL Apply 1 application topically daily as needed for irritation (to affected area. external use only). What changed: when to take this   losartan 50 MG tablet Commonly known as: COZAAR Take 1 tablet (50 mg total) by mouth daily.   meclizine 25 MG tablet Commonly known as: ANTIVERT TAKE 1 TABLET BY MOUTH 3 TIMES A DAY AS  NEEDED What changed:  when to take this reasons to take this   metFORMIN 500 MG tablet Commonly known as: GLUCOPHAGE TAKE ONE TABLET BY MOUTH EVERY MORNING ONE AT LUNCH AND 2 AT BEDTIME What changed: See the new instructions.   methenamine 1 g tablet Commonly known as: HIPREX Take 1 tablet (1 g total) by mouth 2 (two) times daily.   mirabegron ER 50 MG Tb24 tablet Commonly known as: Myrbetriq Take 1 tablet (50 mg total) by mouth daily.   nateglinide 120 MG tablet Commonly known as: STARLIX TAKE 1 TABLET BY MOUTH 3 TIMES A DAY BEFORE MEALS What changed: See the new instructions.   omega-3 acid ethyl esters 1 g capsule Commonly known as: LOVAZA Take 2 g by mouth daily.   omeprazole 20 MG capsule Commonly known as: PRILOSEC TAKE 1 CAPSULE BY MOUTH EVERY DAY   PreserVision AREDS 2 Caps Take 1 capsule by mouth 2 (two) times daily.   Theratears 0.25 %  Soln Generic drug: Carboxymethylcellulose Sodium Place 1 drop into both eyes 2 (two) times daily.        Allergies:  Allergies  Allergen Reactions   Adhesive [Tape] Other (See Comments)    Reaction:  Blisters    Estrogens Other (See Comments)    Reaction:  Migraines    Sulfonamide Derivatives Rash    Family History: Family History  Problem Relation Age of Onset   Ovarian cancer Mother    Diabetes Father    Heart disease Father    Breast cancer Sister    Liver cancer Sister    Colon cancer Cousin        Paternal side   Diabetes Maternal Aunt    Liver disease Cousin        Maternal side, never drank    Social History:  reports that she has never smoked. She has never used smokeless tobacco. She reports that she does not drink alcohol and does not use drugs.  ROS: All other review of systems were reviewed and are negative except what is noted above in HPI  Physical Exam: BP (!) 165/72    Pulse 86   Constitutional:  Alert and oriented, No acute distress. HEENT: [place] AT, moist mucus membranes.  Trachea  midline, no masses. Cardiovascular: No clubbing, cyanosis, or edema. Respiratory: Normal respiratory effort, no increased work of breathing. GI: Abdomen is soft, nontender, nondistended, no abdominal masses GU: No CVA tenderness.  Lymph: No cervical or inguinal lymphadenopathy. Skin: No rashes, bruises or suspicious lesions. Neurologic: Grossly intact, no focal deficits, moving all 4 extremities. Psychiatric: Normal mood and affect.  Laboratory Data: Lab Results  Component Value Date   WBC 11.9 (H) 03/17/2021   HGB 11.3 (L) 03/17/2021   HCT 34.3 (L) 03/17/2021   MCV 90.0 03/17/2021   PLT 227 03/17/2021    Lab Results  Component Value Date   CREATININE 0.80 03/17/2021    No results found for: PSA  No results found for: TESTOSTERONE  Lab Results  Component Value Date   HGBA1C 6.1 (H) 03/15/2021    Urinalysis    Component Value Date/Time   COLORURINE YELLOW 03/15/2021 1740   APPEARANCEUR HAZY (A) 03/15/2021 1740   APPEARANCEUR Cloudy (A) 03/14/2021 1543   LABSPEC 1.015 03/15/2021 1740   PHURINE 5.0 03/15/2021 1740   GLUCOSEU NEGATIVE 03/15/2021 1740   HGBUR NEGATIVE 03/15/2021 1740   BILIRUBINUR NEGATIVE 03/15/2021 1740   BILIRUBINUR Negative 03/14/2021 1543   KETONESUR NEGATIVE 03/15/2021 1740   PROTEINUR 30 (A) 03/15/2021 1740   UROBILINOGEN 0.2 08/18/2019 1326   UROBILINOGEN 0.2 04/08/2008 1454   NITRITE NEGATIVE 03/15/2021 1740   LEUKOCYTESUR LARGE (A) 03/15/2021 1740    Lab Results  Component Value Date   LABMICR See below: 03/14/2021   WBCUA 11-30 (A) 03/14/2021   LABEPIT 0-10 03/14/2021   MUCUS Present 11/16/2020   BACTERIA RARE (A) 03/15/2021    Pertinent Imaging: CT 03/15/2021: Images reviewed and discussed with the patiet No results found for this or any previous visit.  No results found for this or any previous visit.  No results found for this or any previous visit.  No results found for this or any previous visit.  No results found for  this or any previous visit.  No results found for this or any previous visit.  No results found for this or any previous visit.  No results found for this or any previous visit.   Assessment & Plan:    1.  Nephrolithiasis -We discussed the management of kidney stones. These options include observation, ureteroscopy, shockwave lithotripsy (ESWL) and percutaneous nephrolithotomy (PCNL). We discussed which options are relevant to the patient's stone(s). We discussed the natural history of kidney stones as well as the complications of untreated stones and the impact on quality of life without treatment as well as with each of the above listed treatments. We also discussed the efficacy of each treatment in its ability to clear the stone burden. With any of these management options I discussed the signs and symptoms of infection and the need for emergent treatment should these be experienced. For each option we discussed the ability of each procedure to clear the patient of their stone burden.   For observation I described the risks which include but are not limited to silent renal damage, life-threatening infection, need for emergent surgery, failure to pass stone and pain.   For ureteroscopy I described the risks which include bleeding, infection, damage to contiguous structures, positioning injury, ureteral stricture, ureteral avulsion, ureteral injury, need for prolonged ureteral stent, inability to perform ureteroscopy, need for an interval procedure, inability to clear stone burden, stent discomfort/pain, heart attack, stroke, pulmonary embolus and the inherent risks with general anesthesia.   For shockwave lithotripsy I described the risks which include arrhythmia, kidney contusion, kidney hemorrhage, need for transfusion, pain, inability to adequately break up stone, inability to pass stone fragments, Steinstrasse, infection associated with obstructing stones, need for alternate surgical  procedure, need for repeat shockwave lithotripsy, MI, CVA, PE and the inherent risks with anesthesia/conscious sedation.   For PCNL I described the risks including positioning injury, pneumothorax, hydrothorax, need for chest tube, inability to clear stone burden, renal laceration, arterial venous fistula or malformation, need for embolization of kidney, loss of kidney or renal function, need for repeat procedure, need for prolonged nephrostomy tube, ureteral avulsion, MI, CVA, PE and the inherent risks of general anesthesia.   - The patient would like to proceed with right ureteroscopic stone extraction - Urinalysis, Routine w reflex microscopic   No follow-ups on file.  [name], MD  [facility] [place]

## 2021-03-23 NOTE — Telephone Encounter (Signed)
Dena, can we find out the amount of units per capsule that patient is taking for Creon? If she is taking the lower dosage (24,000) 3 capsules with meals and 1 with snacks, then we could change it to Creon 36,000 unit capsules and only have to take TWO with meals and 1 with snacks. Will help decrease pill burden. I just need clarification. Thanks!

## 2021-03-27 ENCOUNTER — Other Ambulatory Visit: Payer: Self-pay

## 2021-03-27 ENCOUNTER — Encounter (HOSPITAL_COMMUNITY)
Admission: RE | Admit: 2021-03-27 | Discharge: 2021-03-27 | Disposition: A | Discharge status: Home / Self Care | Payer: Medicare Other | Source: Ambulatory Visit | Attending: Urology | Admitting: Urology

## 2021-03-27 ENCOUNTER — Ambulatory Visit (INDEPENDENT_AMBULATORY_CARE_PROVIDER_SITE_OTHER): Payer: Medicare Other | Admitting: Family Medicine

## 2021-03-27 VITALS — BP 128/68 | HR 79 | Temp 98.3°F | Ht 64.0 in | Wt 134.4 lb

## 2021-03-27 DIAGNOSIS — N200 Calculus of kidney: Secondary | ICD-10-CM

## 2021-03-27 DIAGNOSIS — N390 Urinary tract infection, site not specified: Secondary | ICD-10-CM

## 2021-03-27 DIAGNOSIS — E871 Hypo-osmolality and hyponatremia: Secondary | ICD-10-CM

## 2021-03-27 DIAGNOSIS — D649 Anemia, unspecified: Secondary | ICD-10-CM | POA: Diagnosis not present

## 2021-03-27 NOTE — Progress Notes (Signed)
Subjective:  Patient ID: Deborah Gray, female    DOB: [birth year]  Age: 88 y.o. MRN: [id number]  CC: Chief Complaint  Patient presents with   Hospitalization Follow-up    From [facility]. Patient had a kidney stone and UTI    HPI:  88 year old female with type 2 diabetes, hypertension, GERD, hyperlipidemia, esophageal spasm presents for hospital follow-up.  Patient recently mated to the hospital from 1/26 to 1/28.  Hospital course, laboratory studies, and discharge summary reviewed. In summary: Patient presented to the hospital with lower abdominal pain, ongoing hematuria, and fever, Tmax 102.  Work-up in the ER revealed an obstructing 7 mm stone in the distal right ureter with moderate to advanced hydronephrosis.  Patient's clinical picture consistent with sepsis.  She was placed on IV antibiotics and given pain control.  Underwent surgery -cystoscopy, right retrograde pyelogram.  Ureteral stent was placed.  Patient states that she is feeling well.  No additional fever.  She surgery on 2/9 for treatment of her stone.  No nausea or vomiting.  Patient has been on antibiotic therapy.  Patient Active Problem List   Diagnosis Date Noted   UTI (urinary tract infection) 03/15/2021   Nephrolithiasis 03/15/2021   Hyponatremia 03/15/2021   Prolapsed internal hemorrhoids, grade 3 01/23/2021   Seasonal allergies 01/18/2020   IBS (irritable bowel syndrome) 01/30/2015   Type 2 diabetes mellitus ([place]) 12/22/2014   Hyperlipidemia, unspecified 12/22/2014   Essential hypertension 11/25/2013   Osteopenia 10/23/2012   Chronic pancreatitis ([place]) 02/26/2011   GERD (gastroesophageal reflux disease) 01/18/2011   Esophageal spasm 11/29/2010   DJD (degenerative joint disease) 11/29/2010   Hx of adenomatous colonic polyps 06/29/2009    Social Hx   Social History   Socioeconomic History   Marital status: Married    Spouse name: Not on file   Number of children: 3   Years of education: Not on  file   Highest education level: Not on file  Occupational History   Occupation: Retired  Tobacco Use   Smoking status: Never   Smokeless tobacco: Never   Tobacco comments:    Never smoked  Vaping Use   Vaping Use: Never used  Substance and Sexual Activity   Alcohol use: No    Alcohol/week: 0.0 standard drinks   Drug use: No   Sexual activity: Not on file  Other Topics Concern   Not on file  Social History Narrative   Not on file   Social Determinants of Health   Financial Resource Strain: Not on file  Food Insecurity: Not on file  Transportation Needs: Not on file  Physical Activity: Not on file  Stress: Not on file  Social Connections: Not on file    Review of Systems Per HPI  Objective:  BP 128/68    Pulse 79    Temp 98.3 F (36.8 C) (Oral)    Ht 5' 4" (1.626 m)    Wt 134 lb 6.4 oz (61 kg)    SpO2 100%    BMI 23.07 kg/m   BP/Weight 03/27/2021 03/23/2021 0/30/0923  Systolic BP 300 762 263  Diastolic BP 68 72 62  Wt. (Lbs) 134.4 - -  BMI 23.07 - -    Physical Exam Vitals and nursing note reviewed.  Constitutional:      General: She is not in acute distress.    Appearance: Normal appearance.  HENT:     Head: Normocephalic and atraumatic.  Cardiovascular:     Rate and Rhythm: Normal  rate and regular rhythm.  Pulmonary:     Effort: Pulmonary effort is normal.     Breath sounds: Normal breath sounds.  Abdominal:     General: There is no distension.     Palpations: Abdomen is soft.     Tenderness: There is no abdominal tenderness.  Neurological:     Mental Status: She is alert.    Lab Results  Component Value Date   WBC 11.9 (H) 03/17/2021   HGB 11.3 (L) 03/17/2021   HCT 34.3 (L) 03/17/2021   PLT 227 03/17/2021   GLUCOSE 115 (H) 03/17/2021   CHOL 168 11/29/2020   TRIG 92 11/29/2020   HDL 68 11/29/2020   LDLCALC 83 11/29/2020   ALT 13 03/16/2021   AST 10 (L) 03/16/2021   NA 131 (L) 03/17/2021   K 3.9 03/17/2021   CL 101 03/17/2021   CREATININE  0.80 03/17/2021   BUN 31 (H) 03/17/2021   CO2 24 03/17/2021   TSH 1.735 11/07/2017   INR 1.7 (H) 04/08/2008   HGBA1C 6.1 (H) 03/15/2021   MICROALBUR 1.0 11/22/2013     Assessment & Plan:   Problem List Items Addressed This Visit       Genitourinary   UTI (urinary tract infection)    Patient has done well from her hospitalization.  She states that she has completed her antibiotic course. She has upcoming surgery on 2/9.      Nephrolithiasis - Primary    Patient has upcoming definitive treatment for stone on 2/9 with her urologist.        Other   Hyponatremia    Metabolic panel ordered to reassess.      Relevant Orders   CMP14+EGFR   Other Visit Diagnoses     Anemia, unspecified type       Relevant Orders   CBC      Follow-up:  3 months  [place]

## 2021-03-27 NOTE — Assessment & Plan Note (Addendum)
Metabolic panel ordered to reassess.

## 2021-03-27 NOTE — Patient Instructions (Signed)
Labs today.  Follow up in 3 months.  Take care  Dr. Saivion Goettel  

## 2021-03-27 NOTE — Assessment & Plan Note (Signed)
Patient has done well from her hospitalization.  She states that she has completed her antibiotic course. She has upcoming surgery on 2/9.

## 2021-03-27 NOTE — Assessment & Plan Note (Signed)
Patient has upcoming definitive treatment for stone on 2/9 with her urologist.

## 2021-03-28 ENCOUNTER — Ambulatory Visit: Payer: Medicare Other | Admitting: Physician Assistant

## 2021-03-28 ENCOUNTER — Ambulatory Visit: Payer: Medicare Other | Admitting: Gastroenterology

## 2021-03-28 LAB — CMP14+EGFR
ALT: 13 IU/L (ref 0–32)
AST: 12 IU/L (ref 0–40)
Albumin/Globulin Ratio: 2 (ref 1.2–2.2)
Albumin: 4.3 g/dL (ref 3.6–4.6)
Alkaline Phosphatase: 80 IU/L (ref 44–121)
BUN/Creatinine Ratio: 31 — ABNORMAL HIGH (ref 12–28)
BUN: 23 mg/dL (ref 8–27)
Bilirubin Total: 0.3 mg/dL (ref 0.0–1.2)
CO2: 24 mmol/L (ref 20–29)
Calcium: 10.3 mg/dL (ref 8.7–10.3)
Chloride: 102 mmol/L (ref 96–106)
Creatinine, Ser: 0.75 mg/dL (ref 0.57–1.00)
Globulin, Total: 2.1 g/dL (ref 1.5–4.5)
Glucose: 102 mg/dL — ABNORMAL HIGH (ref 70–99)
Potassium: 5.3 mmol/L — ABNORMAL HIGH (ref 3.5–5.2)
Sodium: 139 mmol/L (ref 134–144)
Total Protein: 6.4 g/dL (ref 6.0–8.5)
eGFR: 78 mL/min/{1.73_m2} (ref >59)

## 2021-03-28 LAB — CBC
Hematocrit: 38.5 % (ref 34.0–46.6)
Hemoglobin: 12.8 g/dL (ref 11.1–15.9)
MCH: 28.4 pg (ref 26.6–33.0)
MCHC: 33.2 g/dL (ref 31.5–35.7)
MCV: 86 fL (ref 79–97)
Platelets: 363 10*3/uL (ref 150–450)
RBC: 4.5 x10E6/uL (ref 3.77–5.28)
RDW: 13.1 % (ref 11.7–15.4)
WBC: 9.7 10*3/uL (ref 3.4–10.8)

## 2021-03-29 ENCOUNTER — Ambulatory Visit (HOSPITAL_COMMUNITY): Payer: Medicare Other | Admitting: Anesthesiology

## 2021-03-29 ENCOUNTER — Ambulatory Visit (HOSPITAL_BASED_OUTPATIENT_CLINIC_OR_DEPARTMENT_OTHER): Payer: Medicare Other | Admitting: Anesthesiology

## 2021-03-29 ENCOUNTER — Encounter (HOSPITAL_COMMUNITY)
Admission: RE | Disposition: A | Discharge status: Home / Self Care | Payer: Self-pay | Source: Home / Self Care | Attending: Urology

## 2021-03-29 ENCOUNTER — Encounter (HOSPITAL_COMMUNITY): Payer: Self-pay | Admitting: Urology

## 2021-03-29 ENCOUNTER — Ambulatory Visit (HOSPITAL_COMMUNITY): Payer: Medicare Other

## 2021-03-29 ENCOUNTER — Ambulatory Visit (HOSPITAL_COMMUNITY)
Admission: RE | Admit: 2021-03-29 | Discharge: 2021-03-29 | Disposition: A | Discharge status: Home / Self Care | Payer: Medicare Other | Attending: Urology | Admitting: Urology

## 2021-03-29 DIAGNOSIS — I10 Essential (primary) hypertension: Secondary | ICD-10-CM | POA: Insufficient documentation

## 2021-03-29 DIAGNOSIS — K219 Gastro-esophageal reflux disease without esophagitis: Secondary | ICD-10-CM | POA: Diagnosis not present

## 2021-03-29 DIAGNOSIS — N132 Hydronephrosis with renal and ureteral calculous obstruction: Secondary | ICD-10-CM | POA: Diagnosis not present

## 2021-03-29 DIAGNOSIS — N201 Calculus of ureter: Secondary | ICD-10-CM | POA: Diagnosis not present

## 2021-03-29 DIAGNOSIS — N1330 Unspecified hydronephrosis: Secondary | ICD-10-CM | POA: Diagnosis not present

## 2021-03-29 DIAGNOSIS — G709 Myoneural disorder, unspecified: Secondary | ICD-10-CM | POA: Diagnosis not present

## 2021-03-29 DIAGNOSIS — Z79899 Other long term (current) drug therapy: Secondary | ICD-10-CM | POA: Diagnosis not present

## 2021-03-29 DIAGNOSIS — E119 Type 2 diabetes mellitus without complications: Secondary | ICD-10-CM | POA: Diagnosis not present

## 2021-03-29 DIAGNOSIS — N134 Hydroureter: Secondary | ICD-10-CM | POA: Diagnosis not present

## 2021-03-29 HISTORY — PX: CYSTOSCOPY WITH RETROGRADE PYELOGRAM, URETEROSCOPY AND STENT PLACEMENT: SHX5789

## 2021-03-29 HISTORY — PX: HOLMIUM LASER APPLICATION: SHX5852

## 2021-03-29 LAB — GLUCOSE, CAPILLARY: Glucose-Capillary: 123 mg/dL — ABNORMAL HIGH (ref 70–99)

## 2021-03-29 SURGERY — CYSTOURETEROSCOPY, WITH RETROGRADE PYELOGRAM AND STENT INSERTION
Anesthesia: General | Site: Ureter | Laterality: Right

## 2021-03-29 MED ORDER — FENTANYL CITRATE (PF) 100 MCG/2ML IJ SOLN
INTRAMUSCULAR | Status: AC
  Filled 2021-03-29: qty 2

## 2021-03-29 MED ORDER — DIATRIZOATE MEGLUMINE 30 % UR SOLN
URETHRAL | Status: DC | PRN
  Administered 2021-03-29: 10 mL via URETHRAL

## 2021-03-29 MED ORDER — SODIUM CHLORIDE 0.9 % IV SOLN
2.0000 g | INTRAVENOUS | Status: AC
  Administered 2021-03-29: 2 g via INTRAVENOUS
  Filled 2021-03-29: qty 20

## 2021-03-29 MED ORDER — DEXAMETHASONE SODIUM PHOSPHATE 10 MG/ML IJ SOLN
INTRAMUSCULAR | Status: DC | PRN
  Administered 2021-03-29: 5 mg via INTRAVENOUS

## 2021-03-29 MED ORDER — PHENYLEPHRINE 40 MCG/ML (10ML) SYRINGE FOR IV PUSH (FOR BLOOD PRESSURE SUPPORT)
PREFILLED_SYRINGE | INTRAVENOUS | Status: DC | PRN
  Administered 2021-03-29 (×3): 80 ug via INTRAVENOUS

## 2021-03-29 MED ORDER — LACTATED RINGERS IV SOLN: INTRAVENOUS | Status: DC

## 2021-03-29 MED ORDER — ONDANSETRON HCL 4 MG PO TABS: 4.0000 mg | ORAL_TABLET | Freq: Every day | ORAL | 1 refills | Status: DC | PRN

## 2021-03-29 MED ORDER — SODIUM CHLORIDE 0.9 % IR SOLN
Status: DC | PRN
  Administered 2021-03-29 (×2): 3000 mL

## 2021-03-29 MED ORDER — PROPOFOL 10 MG/ML IV BOLUS
INTRAVENOUS | Status: AC
  Filled 2021-03-29: qty 20

## 2021-03-29 MED ORDER — LIDOCAINE 2% (20 MG/ML) 5 ML SYRINGE
INTRAMUSCULAR | Status: DC | PRN
  Administered 2021-03-29: 60 mg via INTRAVENOUS

## 2021-03-29 MED ORDER — ORAL CARE MOUTH RINSE: 15.0000 mL | Freq: Once | OROMUCOSAL | Status: AC

## 2021-03-29 MED ORDER — EPHEDRINE SULFATE (PRESSORS) 50 MG/ML IJ SOLN
INTRAMUSCULAR | Status: DC | PRN
Start: 2021-03-29 — End: 2021-03-29
  Administered 2021-03-29: 10 mg via INTRAVENOUS

## 2021-03-29 MED ORDER — FENTANYL CITRATE (PF) 100 MCG/2ML IJ SOLN
INTRAMUSCULAR | Status: DC | PRN
  Administered 2021-03-29: 25 ug via INTRAVENOUS

## 2021-03-29 MED ORDER — EPHEDRINE 5 MG/ML INJ
INTRAVENOUS | Status: AC
  Filled 2021-03-29: qty 5

## 2021-03-29 MED ORDER — TRAMADOL HCL 50 MG PO TABS
50.0000 mg | ORAL_TABLET | Freq: Four times a day (QID) | ORAL | 0 refills | Status: DC | PRN
Start: 2021-03-29 — End: 2021-09-09

## 2021-03-29 MED ORDER — PROPOFOL 10 MG/ML IV BOLUS
INTRAVENOUS | Status: DC | PRN
  Administered 2021-03-29: 120 mg via INTRAVENOUS

## 2021-03-29 MED ORDER — CHLORHEXIDINE GLUCONATE 0.12 % MT SOLN
15.0000 mL | Freq: Once | OROMUCOSAL | Status: AC
  Administered 2021-03-29: 15 mL via OROMUCOSAL

## 2021-03-29 MED ORDER — DEXAMETHASONE SODIUM PHOSPHATE 10 MG/ML IJ SOLN: INTRAMUSCULAR | Status: DC | PRN

## 2021-03-29 MED ORDER — SODIUM CHLORIDE 0.9 % IV SOLN: INTRAVENOUS | Status: DC | PRN

## 2021-03-29 MED ORDER — WATER FOR IRRIGATION, STERILE IR SOLN
Status: DC | PRN
  Administered 2021-03-29: 500 mL

## 2021-03-29 SURGICAL SUPPLY — 24 items
BAG DRAIN URO TABLE W/ADPT NS (BAG) ×3 IMPLANT
BAG DRN 8 ADPR NS SKTRN CSTL (BAG) ×1
BAG HAMPER (MISCELLANEOUS) ×3 IMPLANT
CATH INTERMIT  6FR 70CM (CATHETERS) ×3 IMPLANT
CLOTH BEACON ORANGE TIMEOUT ST (SAFETY) ×3 IMPLANT
EXTRACTOR STONE NITINOL NGAGE (UROLOGICAL SUPPLIES) ×1 IMPLANT
GLOVE SURG POLYISO LF SZ8 (GLOVE) ×3 IMPLANT
GLOVE SURG UNDER POLY LF SZ7 (GLOVE) ×6 IMPLANT
GOWN STRL REUS W/TWL LRG LVL3 (GOWN DISPOSABLE) ×3 IMPLANT
GOWN STRL REUS W/TWL XL LVL3 (GOWN DISPOSABLE) ×3 IMPLANT
GUIDEWIRE STR DUAL SENSOR (WIRE) ×3 IMPLANT
GUIDEWIRE STR ZIPWIRE 035X150 (MISCELLANEOUS) ×3 IMPLANT
IV NS IRRIG 3000ML ARTHROMATIC (IV SOLUTION) ×6 IMPLANT
KIT TURNOVER CYSTO (KITS) ×3 IMPLANT
MANIFOLD NEPTUNE II (INSTRUMENTS) ×3 IMPLANT
PACK CYSTO (CUSTOM PROCEDURE TRAY) ×3 IMPLANT
PAD ARMBOARD 7.5X6 YLW CONV (MISCELLANEOUS) ×3 IMPLANT
STENT URET 6FRX24 CONTOUR (STENTS) ×1 IMPLANT
SYR 10ML LL (SYRINGE) ×3 IMPLANT
SYR CONTROL 10ML LL (SYRINGE) ×3 IMPLANT
TOWEL OR 17X26 4PK STRL BLUE (TOWEL DISPOSABLE) ×3 IMPLANT
TRACTIP FLEXIVA PULS ID 200XHI (Laser) IMPLANT
TRACTIP FLEXIVA PULSE ID 200 (Laser) ×2
WATER STERILE IRR 500ML POUR (IV SOLUTION) ×3 IMPLANT

## 2021-03-29 NOTE — Anesthesia Postprocedure Evaluation (Signed)
Anesthesia Post Note  Patient: Deborah Gray  Procedure(s) Performed: CYSTOSCOPY WITH RETROGRADE PYELOGRAM, URETEROSCOPY AND STENT EXCHANGE (Right: Ureter) HOLMIUM LASER APPLICATION (Right: Ureter)  Patient location during evaluation: Phase II Anesthesia Type: General Level of consciousness: awake Pain management: pain level controlled Vital Signs Assessment: post-procedure vital signs reviewed and stable Respiratory status: spontaneous breathing and respiratory function stable Cardiovascular status: blood pressure returned to baseline and stable Postop Assessment: no headache and no apparent nausea or vomiting Anesthetic complications: no Comments: Late entry   No notable events documented.   Last Vitals:  Vitals:   03/29/21 1230 03/29/21 1247  BP: 140/71 (!) 147/76  Pulse: 85 87  Resp: 11 15  Temp:  36.4 C  SpO2: 99% 97%    Last Pain:  Vitals:   03/29/21 1247  TempSrc: Oral  PainSc: [place]

## 2021-03-29 NOTE — Anesthesia Procedure Notes (Signed)
Procedure Name: LMA Insertion Date/Time: 03/29/2021 11:02 AM Performed by: Myna Bright, CRNA Pre-anesthesia Checklist: Patient identified, Emergency Drugs available, Suction available and Patient being monitored Patient Re-evaluated:Patient Re-evaluated prior to induction Oxygen Delivery Method: Circle system utilized Preoxygenation: Pre-oxygenation with 100% oxygen Induction Type: IV induction Ventilation: Mask ventilation without difficulty LMA: LMA inserted LMA Size: 4.0 Number of attempts: 1 Placement Confirmation: positive ETCO2 and breath sounds checked- equal and bilateral Tube secured with: Tape Dental Injury: Teeth and Oropharynx as per pre-operative assessment

## 2021-03-29 NOTE — Transfer of Care (Signed)
Immediate Anesthesia Transfer of Care Note  Patient: Deborah Gray  Procedure(s) Performed: CYSTOSCOPY WITH RETROGRADE PYELOGRAM, URETEROSCOPY AND STENT EXCHANGE (Right: Ureter) HOLMIUM LASER APPLICATION (Right: Ureter)  Patient Location: PACU  Anesthesia Type:General  Level of Consciousness: awake  Airway & Oxygen Therapy: Patient Spontanous Breathing  Post-op Assessment: Report given to RN  Post vital signs: Reviewed and stable  Last Vitals:  Vitals Value Taken Time  BP 147/71 03/29/21 1204  Temp    Pulse 88 03/29/21 1205  Resp 17 03/29/21 1206  SpO2 100 % 03/29/21 1205  Vitals shown include unvalidated device data.  Last Pain:  Vitals:   03/29/21 0906  TempSrc: Oral  PainSc: 0-No pain         Complications: No notable events documented.

## 2021-03-29 NOTE — Anesthesia Preprocedure Evaluation (Signed)
Anesthesia Evaluation  Patient identified by MRN, date of birth, ID band Patient awake    Reviewed: Allergy & Precautions, H&P , NPO status , Patient's Chart, lab work & pertinent test results, reviewed documented beta blocker date and time   History of Anesthesia Complications (+) PONV and history of anesthetic complications  Airway Mallampati: II  TM Distance: >3 FB Neck ROM: full    Dental no notable dental hx.    Pulmonary neg pulmonary ROS,    Pulmonary exam normal breath sounds clear to auscultation       Cardiovascular Exercise Tolerance: Good hypertension, negative cardio ROS   Rhythm:regular Rate:Normal     Neuro/Psych  Neuromuscular disease negative psych ROS   GI/Hepatic Neg liver ROS, GERD  Medicated,  Endo/Other  negative endocrine ROSdiabetes  Renal/GU Renal disease  negative genitourinary   Musculoskeletal   Abdominal   Peds  Hematology negative hematology ROS (+)   Anesthesia Other Findings   Reproductive/Obstetrics negative OB ROS                             Anesthesia Physical Anesthesia Plan  ASA: 2  Anesthesia Plan: General and General LMA   Post-op Pain Management:    Induction:   PONV Risk Score and Plan: Ondansetron  Airway Management Planned:   Additional Equipment:   Intra-op Plan:   Post-operative Plan:   Informed Consent: I have reviewed the patients History and Physical, chart, labs and discussed the procedure including the risks, benefits and alternatives for the proposed anesthesia with the patient or authorized representative who has indicated his/her understanding and acceptance.     Dental Advisory Given  Plan Discussed with: CRNA  Anesthesia Plan Comments:         Anesthesia Quick Evaluation

## 2021-03-29 NOTE — Interval H&P Note (Signed)
History and Physical Interval Note:  03/29/2021 9:46 AM  Deborah Gray  has presented today for surgery, with the diagnosis of right ureteral calculus.  The various methods of treatment have been discussed with the patient and family. After consideration of risks, benefits and other options for treatment, the patient has consented to  Procedure(s): CYSTOSCOPY WITH RETROGRADE PYELOGRAM, URETEROSCOPY AND STENT EXCHANGE (Right) HOLMIUM LASER APPLICATION (Right) as a surgical intervention.  The patient's history has been reviewed, patient examined, no change in status, stable for surgery.  I have reviewed the patient's chart and labs.  Questions were answered to the patient's satisfaction.     [name]

## 2021-03-29 NOTE — Op Note (Signed)
Preoperative diagnosis: Right ureteral stone  Postoperative diagnosis: Same  Procedure: 1 cystoscopy 2 right retrograde pyelography 3.  Intraoperative fluoroscopy, under one hour, with interpretation 4.  Right ureteroscopic stone manipulation with laser lithotripsy 5.  Right 6 x 24 JJ stent placement  Attending: Rosie Fate  Anesthesia: General  Estimated blood loss: None  Drains: Right 6 x 24 JJ ureteral stent with tether  Specimens: stone for analysis  Antibiotics: rocephin  Findings: right mid ureteral calculus with severe proximal hydroureteronephrosis.  Indications: Patient is a 88 year old female with a history of ureteral stone who underwent stent placement 2 weeks ago for UTI/concern for sepsis  After discussing treatment options, she decided proceed with right ureteroscopic stone manipulation.  Procedure in detail: The patient was brought to the operating room and a brief timeout was done to ensure correct patient, correct procedure, correct site.  General anesthesia was administered patient was placed in dorsal lithotomy position.  Her genitalia was then prepped and draped in usual sterile fashion.  A rigid 78 French cystoscope was passed in the urethra and the bladder.  Bladder was inspected free masses or lesions.  the ureteral orifices were in the normal orthotopic locations.  a 6 french ureteral catheter was then instilled into the right ureter orifice.  a gentle retrograde was obtained and findings noted above. Using a grasper the right ureteral stent was brought to the urethral meatus.  we then placed a zip wire through the ureteral stent and advanced up to the renal pelvis.  we then removed the stent and the cystoscope and then we cannulated the right ureteral orifice with a semirigid ureteroscope.  we then encountered the stone in the mid ureter.  Using a 242 nm laser fiber and fragmented the stone into smaller pieces.  the fragments were then removed with a Ngage  basket. Once all stone fragments were removed we then placed a 6 x 24 double-j ureteral stent over the original zip wire.  We then removed the wire and good coil was noted in the the renal pelvis under fluoroscopy and the bladder under direct vision.    the stone fragments were then removed from the bladder and sent for analysis.   the bladder was then drained and this concluded the procedure which was well tolerated by patient.  Complications: None  Condition: Stable, extubated, transferred to PACU  Plan: Patient is to be discharged home as to follow-up in one week. She is to remove her stent in 72 hours by pulling the tether

## 2021-03-29 NOTE — Telephone Encounter (Signed)
Phoned the pt no answer/machine to LM.

## 2021-03-30 ENCOUNTER — Ambulatory Visit (INDEPENDENT_AMBULATORY_CARE_PROVIDER_SITE_OTHER): Payer: Medicare Other | Admitting: Urology

## 2021-03-30 ENCOUNTER — Other Ambulatory Visit: Payer: Self-pay

## 2021-03-30 VITALS — BP 149/63 | HR 98

## 2021-03-30 DIAGNOSIS — Z87440 Personal history of urinary (tract) infections: Secondary | ICD-10-CM

## 2021-03-30 LAB — BLADDER SCAN AMB NON-IMAGING: Scan Result: 147

## 2021-03-30 NOTE — Progress Notes (Signed)
Pt here today for bladder scan. Bladder was scanned and 147 was visualized.   Pt will return in 1 for week  Performed by Estill Bamberg RN  Additional follow up

## 2021-04-02 ENCOUNTER — Encounter (HOSPITAL_COMMUNITY): Payer: Self-pay | Admitting: Urology

## 2021-04-02 NOTE — Telephone Encounter (Signed)
Letter mailed to the pt to contact the office. 

## 2021-04-03 ENCOUNTER — Other Ambulatory Visit: Payer: Self-pay | Admitting: Gastroenterology

## 2021-04-03 ENCOUNTER — Ambulatory Visit: Payer: Medicare Other | Admitting: Gastroenterology

## 2021-04-03 DIAGNOSIS — K861 Other chronic pancreatitis: Secondary | ICD-10-CM

## 2021-04-04 LAB — CALCULI, WITH PHOTOGRAPH (CLINICAL LAB)
Calcium Oxalate Dihydrate: 90 %
Calcium Oxalate Monohydrate: 10 %
Weight Calculi: 50 mg

## 2021-04-05 ENCOUNTER — Ambulatory Visit (INDEPENDENT_AMBULATORY_CARE_PROVIDER_SITE_OTHER): Payer: Medicare Other | Admitting: Physician Assistant

## 2021-04-05 ENCOUNTER — Other Ambulatory Visit: Payer: Self-pay

## 2021-04-05 ENCOUNTER — Telehealth: Payer: Self-pay | Admitting: Internal Medicine

## 2021-04-05 VITALS — BP 139/63 | HR 98

## 2021-04-05 DIAGNOSIS — R8271 Bacteriuria: Secondary | ICD-10-CM

## 2021-04-05 DIAGNOSIS — N200 Calculus of kidney: Secondary | ICD-10-CM

## 2021-04-05 LAB — URINALYSIS, ROUTINE W REFLEX MICROSCOPIC
Bilirubin, UA: NEGATIVE
Glucose, UA: NEGATIVE
Ketones, UA: NEGATIVE
Nitrite, UA: NEGATIVE
Protein,UA: NEGATIVE
Specific Gravity, UA: 1.02 (ref 1.005–1.030)
Urobilinogen, Ur: 0.2 mg/dL (ref 0.2–1.0)
pH, UA: 6.5 (ref 5.0–7.5)

## 2021-04-05 LAB — MICROSCOPIC EXAMINATION: Renal Epithel, UA: NONE SEEN /hpf

## 2021-04-05 NOTE — Telephone Encounter (Signed)
I will just leave rx the way it has been written before since we can't get in touch with her. Deborah Gray had wanted to try and decrease the number of pills taking by increasing the strength of each pill. We will have to sort out later.

## 2021-04-05 NOTE — Telephone Encounter (Signed)
Pt needs refill on Creon sent to her pharmacy , attempted to call pt again no answer, letter was sent out regarding her contacting us before she needed a refill

## 2021-04-05 NOTE — Telephone Encounter (Signed)
FYI  Noted will do

## 2021-04-05 NOTE — Progress Notes (Signed)
Assessment: 1. Nephrolithiasis   2. Bacteriuria     Plan: She will follow-up for office visit in approximately 6 weeks with renal ultrasound prior to appointment.  Urine sent for culture today and no treatment initiated as she is asymptomatic at this time.  Chief Complaint: No chief complaint on file.   HPI: Deborah Gray is a 88 y.o. female who presents for continued evaluation of pelvic discomfort and difficulty voiding since stent removal last week.  She reports feeling much better and is quite pleased with her progress without complaints.  She has no dysuria, burning, gross hematuria at this time.  No fever or chills. Weight = 0-5 WBCs, 0-2 RBCs, few bacteria.  03/30/21 Deborah Gray is a 88yo here today with worsening pelvic pain and difficulty urinating after ureteral stent removal. She underwent ureteroscopic stone extraction yesterday and removed her stent last night. She then developed a weak urinary stream and difficulty urinating last night. She was able to urinate this morning with partial alleviation of her pelvic pain. She denies any gross hematuria. No worsening urinary urgency, frequency. No fevers.   Portions of the above documentation were copied from a prior visit for review purposes only.  Allergies: Allergies  Allergen Reactions   Adhesive [Tape] Other (See Comments)    Reaction:  Blisters    Estrogens Other (See Comments)    Reaction:  Migraines    Sulfonamide Derivatives Rash    PMH: Past Medical History:  Diagnosis Date   Arthritis    Benign neoplasm of colon    Constipation    Diaphragmatic hernia without mention of obstruction or gangrene    Diverticulosis of colon (without mention of hemorrhage)    Dysphagia, pharyngoesophageal phase    Esophageal reflux    Essential hypertension    Heart murmur    Hemorrhoids    Hyperlipidemia    Internal hemorrhoids without mention of complication    Left wrist fracture    PONV (postoperative nausea  and vomiting)    Stricture and stenosis of esophagus    Type 2 diabetes mellitus (HCC)    Unspecified gastritis and gastroduodenitis without mention of hemorrhage     PSH: Past Surgical History:  Procedure Laterality Date   ABDOMINAL HYSTERECTOMY     BACTERIAL OVERGROWTH TEST N/A 09/06/2015   Procedure: BACTERIAL OVERGROWTH TEST;  Surgeon: Daneil Dolin, MD;  [facility];  Service: Endoscopy;  Laterality: N/A;  800   BREAST LUMPECTOMY Right    benign   CATARACT EXTRACTION Bilateral 2006   Implants in both, surgeries done 6 weeks apart   CHOLECYSTECTOMY     COLONOSCOPY  03/08/02   [name]: diverticulosis, internal hemorrhoids, adenomatous colon polyp   COLONOSCOPY  01/23/04   [name]: diverticulosis, internal hemorrhoids   COLONOSCOPY  09/25/05   [name]: diverticulosis   COLONOSCOPY  07/05/09   [name]: severe diverticulosis   COLONOSCOPY  01/21/11   [name]: severe diverticulosis in sigmoid to desc colon, int hemorrhoids, follow up TCS in 5 years   COLONOSCOPY N/A 04/10/2016   [name]: diverticulosis   CYSTOSCOPY W/ URETERAL STENT PLACEMENT Right 03/15/2021   Procedure: CYSTOSCOPY WITH RETROGRADE PYELOGRAM/URETERAL STENT PLACEMENT;  Surgeon: Janith Lima, MD;  Location: [facility];  Service: Urology;  Laterality: Right;   CYSTOSCOPY WITH RETROGRADE PYELOGRAM, URETEROSCOPY AND STENT PLACEMENT Right 03/29/2021   Procedure: CYSTOSCOPY WITH RETROGRADE PYELOGRAM, URETEROSCOPY AND STENT EXCHANGE;  Surgeon: Cleon Gustin, MD;  Location: [facility];  Service: Urology;  Laterality: Right;  DILATION AND CURETTAGE OF UTERUS     ESOPHAGEAL MANOMETRY  03/21/08   Patterson: findings c/w Nutcracker Esophagus   ESOPHAGOGASTRODUODENOSCOPY  03/08/02   [name]: esophageal stricture, chronic gerd, s/p dilation.    ESOPHAGOGASTRODUODENOSCOPY  01/23/04   [name]: esophageal stricture, gastritis, hiatal hernia   ESOPHAGOGASTRODUODENOSCOPY  09/25/05   [name]: gastritis, benign bx, no  H.pylori   ESOPHAGOGASTRODUODENOSCOPY  07/05/09   [name]: gastropathy, benign small bowel bx and gastric bx   ESOPHAGOGASTRODUODENOSCOPY N/A 05/15/2015   Dr. Gala Romney- diffuse moderate inflammation characterized by congestion (edema), erythema, and linear erosions was found in the entire examined stomach. bx= reactive gastropathy   FOOT SURGERY Left    HEMORRHOID BANDING  2017   Dr.[name]   HOLMIUM LASER APPLICATION Right 0/10/6043   Procedure: HOLMIUM LASER APPLICATION;  Surgeon: Cleon Gustin, MD;  Location: [facility];  Service: Urology;  Laterality: Right;   LAPAROSCOPIC VAGINAL HYSTERECTOMY     ORIF WRIST FRACTURE Left 06/23/2018   Procedure: OPEN REDUCTION INTERNAL FIXATION (ORIF) LEFT WRIST FRACTURE;  Surgeon: Renette Butters, MD;  Location: [place];  Service: Orthopedics;  Laterality: Left;  regional arm block   RECTOCELE REPAIR     TOTAL KNEE ARTHROPLASTY Right     SH: Social History   Tobacco Use   Smoking status: Never   Smokeless tobacco: Never   Tobacco comments:    Never smoked  Vaping Use   Vaping Use: Never used  Substance Use Topics   Alcohol use: No    Alcohol/week: 0.0 standard drinks   Drug use: No    ROS: Constitutional:  Negative for fever, chills, weight loss CV: Negative for chest pain Respiratory:  Negative for shortness of breath, wheezing, sleep apnea, frequent cough GI:  Negative for nausea, vomiting, bloody stool, GERD  PE: BP 139/63    Pulse 98  GENERAL APPEARANCE:  Well appearing, well developed, well nourished, NAD HEENT:  Atraumatic, normocephalic NECK:  Supple. Trachea midline ABDOMEN:  Soft, non-tender, no masses EXTREMITIES:  Moves all extremities well, without clubbing, cyanosis, or edema NEUROLOGIC:  Alert and oriented x 3, normal gait, CN II-XII grossly intact MENTAL STATUS:  appropriate BACK:  Non-tender to palpation, No CVAT SKIN:  Warm, dry, and intact   Results: Laboratory Data: Lab Results  Component  Value Date   WBC 9.7 03/27/2021   HGB 12.8 03/27/2021   HCT 38.5 03/27/2021   MCV 86 03/27/2021   PLT 363 03/27/2021    Lab Results  Component Value Date   CREATININE 0.75 03/27/2021    No results found for: PSA  No results found for: TESTOSTERONE  Lab Results  Component Value Date   HGBA1C 6.1 (H) 03/15/2021    Urinalysis    Component Value Date/Time   COLORURINE YELLOW 03/15/2021 1740   APPEARANCEUR Cloudy (A) 03/23/2021 1030   LABSPEC 1.015 03/15/2021 1740   PHURINE 5.0 03/15/2021 1740   GLUCOSEU Negative 03/23/2021 1030   HGBUR NEGATIVE 03/15/2021 1740   BILIRUBINUR Negative 03/23/2021 1030   KETONESUR NEGATIVE 03/15/2021 1740   PROTEINUR 3+ (A) 03/23/2021 1030   PROTEINUR 30 (A) 03/15/2021 1740   UROBILINOGEN 0.2 08/18/2019 1326   UROBILINOGEN 0.2 04/08/2008 1454   NITRITE Negative 03/23/2021 1030   NITRITE NEGATIVE 03/15/2021 1740   LEUKOCYTESUR 1+ (A) 03/23/2021 1030   LEUKOCYTESUR LARGE (A) 03/15/2021 1740    Lab Results  Component Value Date   LABMICR See below: 03/23/2021   WBCUA 6-10 (A) 03/23/2021   LABEPIT None seen  03/23/2021   MUCUS Present 03/23/2021   BACTERIA Few (A) 03/23/2021    Pertinent Imaging: No results found for this or any previous visit.  No results found for this or any previous visit.  No results found for this or any previous visit.  No results found for this or any previous visit.  No results found for this or any previous visit.  No results found for this or any previous visit.  No results found for this or any previous visit.  No results found for this or any previous visit.  No results found for this or any previous visit (from the past 24 hour(s)).

## 2021-04-05 NOTE — Telephone Encounter (Signed)
Patient needs her Creon sent in for refill to [facility]

## 2021-04-06 ENCOUNTER — Other Ambulatory Visit: Payer: Medicare Other | Admitting: Urology

## 2021-04-06 ENCOUNTER — Telehealth: Payer: Self-pay | Admitting: Internal Medicine

## 2021-04-06 NOTE — Telephone Encounter (Signed)
Pt wants a 90 day supple of Creon sent to  [facility]. [phone]

## 2021-04-07 LAB — URINE CULTURE

## 2021-04-09 ENCOUNTER — Encounter: Payer: Self-pay | Admitting: Urology

## 2021-04-09 NOTE — Telephone Encounter (Signed)
Phoned and spoke with the pt and advised that her Creon was sent in 3 days ago. The pt advises that the pharmacy had called her and she is picking it up today.

## 2021-04-09 NOTE — Progress Notes (Signed)
03/30/2021 11:04 AM   Deborah Gray [birth year] [id number]  Referring provider: Tommie Sams, DO [address] [name],  [place] [address]  Pelvic pain   HPI: Ms Desta is a 87yo here today with worsening pelvic pain and difficulty urinating after ureteral stent removal. She underwent ureteroscopic stone extraction yesterday and removed her stent last night. She then developed a weak urinary stream and difficulty urinating last night. She was able to urinate this morning with partial alleviation of her pelvic pain. She denies any gross hematuria. No worsening urinary urgency, frequency. No fevers.    PMH: Past Medical History:  Diagnosis Date   Arthritis    Benign neoplasm of colon    Constipation    Diaphragmatic hernia without mention of obstruction or gangrene    Diverticulosis of colon (without mention of hemorrhage)    Dysphagia, pharyngoesophageal phase    Esophageal reflux    Essential hypertension    Heart murmur    Hemorrhoids    Hyperlipidemia    Internal hemorrhoids without mention of complication    Left wrist fracture    PONV (postoperative nausea and vomiting)    Stricture and stenosis of esophagus    Type 2 diabetes mellitus (HCC)    Unspecified gastritis and gastroduodenitis without mention of hemorrhage     Surgical History: Past Surgical History:  Procedure Laterality Date   ABDOMINAL HYSTERECTOMY     BACTERIAL OVERGROWTH TEST N/A 09/06/2015   Procedure: BACTERIAL OVERGROWTH TEST;  Surgeon: Corbin Ade, MD;  [facility];  Service: Endoscopy;  Laterality: N/A;  800   BREAST LUMPECTOMY Right    benign   CATARACT EXTRACTION Bilateral 2006   Implants in both, surgeries done 6 weeks apart   CHOLECYSTECTOMY     COLONOSCOPY  03/08/02   Jarold Motto: diverticulosis, internal hemorrhoids, adenomatous colon polyp   COLONOSCOPY  01/23/04   Jarold Motto: diverticulosis, internal hemorrhoids   COLONOSCOPY  09/25/05   Jarold Motto: diverticulosis    COLONOSCOPY  07/05/09   Jarold Motto: severe diverticulosis   COLONOSCOPY  01/21/11   Jarold Motto: severe diverticulosis in sigmoid to desc colon, int hemorrhoids, follow up TCS in 5 years   COLONOSCOPY N/A 04/10/2016   [name]: diverticulosis   CYSTOSCOPY W/ URETERAL STENT PLACEMENT Right 03/15/2021   Procedure: CYSTOSCOPY WITH RETROGRADE PYELOGRAM/URETERAL STENT PLACEMENT;  Surgeon: Jannifer Hick, MD;  [facility];  Service: Urology;  Laterality: Right;   CYSTOSCOPY WITH RETROGRADE PYELOGRAM, URETEROSCOPY AND STENT PLACEMENT Right 03/29/2021   Procedure: CYSTOSCOPY WITH RETROGRADE PYELOGRAM, URETEROSCOPY AND STENT EXCHANGE;  Surgeon: Malen Gauze, MD;  Location: [facility];  Service: Urology;  Laterality: Right;   DILATION AND CURETTAGE OF UTERUS     ESOPHAGEAL MANOMETRY  03/21/08   Patterson: findings c/w Nutcracker Esophagus   ESOPHAGOGASTRODUODENOSCOPY  03/08/02   Jarold Motto: esophageal stricture, chronic gerd, s/p dilation.    ESOPHAGOGASTRODUODENOSCOPY  01/23/04   Jarold Motto: esophageal stricture, gastritis, hiatal hernia   ESOPHAGOGASTRODUODENOSCOPY  09/25/05   Jarold Motto: gastritis, benign bx, no H.pylori   ESOPHAGOGASTRODUODENOSCOPY  07/05/09   Jarold Motto: gastropathy, benign small bowel bx and gastric bx   ESOPHAGOGASTRODUODENOSCOPY N/A 05/15/2015   Dr. Jena Gauss- diffuse moderate inflammation characterized by congestion (edema), erythema, and linear erosions was found in the entire examined stomach. bx= reactive gastropathy   FOOT SURGERY Left    HEMORRHOID BANDING  2017   Dr.[name]   HOLMIUM LASER APPLICATION Right 03/29/2021   Procedure: HOLMIUM LASER APPLICATION;  Surgeon: Malen Gauze, MD;  Location: [facility];  Service: Urology;  Laterality: Right;   LAPAROSCOPIC VAGINAL HYSTERECTOMY     ORIF WRIST FRACTURE Left 06/23/2018   Procedure: OPEN REDUCTION INTERNAL FIXATION (ORIF) LEFT WRIST FRACTURE;  Surgeon: Renette Butters, MD;  Location: [facility];  Service: Orthopedics;   Laterality: Left;  regional arm block   RECTOCELE REPAIR     TOTAL KNEE ARTHROPLASTY Right     Home Medications:  Allergies as of 03/30/2021       Reactions   Adhesive [tape] Other (See Comments)   Reaction:  Blisters    Estrogens Other (See Comments)   Reaction:  Migraines    Sulfonamide Derivatives Rash        Medication List        Accurate as of March 30, 2021 11:59 PM. If you have any questions, ask your nurse or doctor.          acetaminophen 500 MG tablet Commonly known as: TYLENOL Take 500 mg by mouth at bedtime.   aspirin EC 81 MG tablet Take 81 mg by mouth at bedtime.   blood glucose meter kit and supplies Kit Dispense based on  insurance preference. Tests once a day E11.9   cholecalciferol 25 MCG (1000 UNIT) tablet Commonly known as: VITAMIN D3 Take 1,000 Units by mouth daily.   clidinium-chlordiazePOXIDE 5-2.5 MG capsule Commonly known as: LIBRAX Take 1 capsule by mouth 4 (four) times daily as needed (for esophageal symptoms). What changed: when to take this   clobetasol 0.05 % external solution Commonly known as: TEMOVATE 1 application daily as needed (itching).   Creon 24000-76000 units Cpep Generic drug: Pancrelipase (Lip-Prot-Amyl) TAKE 3 CAPSULES THREE TIMES DAILY WITH MEALS AND ONE CAPSULE WITH SNACK TWICE DAILY What changed: See the new instructions.   Ester-C 500-60 MG Tabs Take 1 tablet by mouth daily.   ezetimibe 10 MG tablet Commonly known as: ZETIA TAKE 1 TABLET BY MOUTH EVERY DAY What changed: when to take this   hyoscyamine 0.125 MG SL tablet Commonly known as: LEVSIN SL USE 1 TABLET UNDER THE TONGUE BEFORE MEALS AND AT BEDTIME AS NEEDED FOR FLARES IN SYMPTOMS, diarrhea/abdominal cramping What changed:  how much to take how to take this when to take this reasons to take this additional instructions   ipratropium 0.06 % nasal spray Commonly known as: ATROVENT Place 1 spray into both nostrils daily.    ketoconazole 2 % cream Commonly known as: NIZORAL Apply 1 application topically daily as needed for irritation (to affected area. external use only). What changed: when to take this   losartan 50 MG tablet Commonly known as: COZAAR Take 1 tablet (50 mg total) by mouth daily.   meclizine 25 MG tablet Commonly known as: ANTIVERT TAKE 1 TABLET BY MOUTH 3 TIMES A DAY AS NEEDED What changed:  when to take this reasons to take this   metFORMIN 500 MG tablet Commonly known as: GLUCOPHAGE TAKE ONE TABLET BY MOUTH EVERY MORNING ONE AT LUNCH AND 2 AT BEDTIME What changed: See the new instructions.   methenamine 1 g tablet Commonly known as: HIPREX Take 1 tablet (1 g total) by mouth 2 (two) times daily.   mirabegron ER 50 MG Tb24 tablet Commonly known as: Myrbetriq Take 1 tablet (50 mg total) by mouth daily.   nateglinide 120 MG tablet Commonly known as: STARLIX TAKE 1 TABLET BY MOUTH 3 TIMES A DAY BEFORE MEALS What changed: See the new instructions.   omega-3 acid ethyl esters  1 g capsule Commonly known as: LOVAZA Take 2 g by mouth daily.   omeprazole 20 MG capsule Commonly known as: PRILOSEC TAKE 1 CAPSULE BY MOUTH EVERY DAY   ondansetron 4 MG tablet Commonly known as: Zofran Take 1 tablet (4 mg total) by mouth daily as needed for nausea or vomiting.   PreserVision AREDS 2 Caps Take 1 capsule by mouth 2 (two) times daily.   Theratears 0.25 % Soln Generic drug: Carboxymethylcellulose Sodium Place 1 drop into both eyes 2 (two) times daily.   traMADol 50 MG tablet Commonly known as: Ultram Take 1 tablet (50 mg total) by mouth every 6 (six) hours as needed.        Allergies:  Allergies  Allergen Reactions   Adhesive [Tape] Other (See Comments)    Reaction:  Blisters    Estrogens Other (See Comments)    Reaction:  Migraines    Sulfonamide Derivatives Rash    Family History: Family History  Problem Relation Age of Onset   Ovarian cancer Mother    Diabetes  Father    Heart disease Father    Breast cancer Sister    Liver cancer Sister    Colon cancer Cousin        Paternal side   Diabetes Maternal Aunt    Liver disease Cousin        Maternal side, never drank    Social History:  reports that she has never smoked. She has never used smokeless tobacco. She reports that she does not drink alcohol and does not use drugs.  ROS: All other review of systems were reviewed and are negative except what is noted above in HPI  Physical Exam: BP (!) 149/63    Pulse 98   Constitutional:  Alert and oriented, No acute distress. HEENT: [place] AT, moist mucus membranes.  Trachea midline, no masses. Cardiovascular: No clubbing, cyanosis, or edema. Respiratory: Normal respiratory effort, no increased work of breathing. GI: Abdomen is soft, nontender, nondistended, no abdominal masses GU: No CVA tenderness.  Lymph: No cervical or inguinal lymphadenopathy. Skin: No rashes, bruises or suspicious lesions. Neurologic: Grossly intact, no focal deficits, moving all 4 extremities. Psychiatric: Normal mood and affect.  Laboratory Data: Lab Results  Component Value Date   WBC 9.7 03/27/2021   HGB 12.8 03/27/2021   HCT 38.5 03/27/2021   MCV 86 03/27/2021   PLT 363 03/27/2021    Lab Results  Component Value Date   CREATININE 0.75 03/27/2021    No results found for: PSA  No results found for: TESTOSTERONE  Lab Results  Component Value Date   HGBA1C 6.1 (H) 03/15/2021    Urinalysis    Component Value Date/Time   COLORURINE YELLOW 03/15/2021 1740   APPEARANCEUR Clear 04/05/2021 1208   LABSPEC 1.015 03/15/2021 1740   PHURINE 5.0 03/15/2021 1740   GLUCOSEU Negative 04/05/2021 1208   HGBUR NEGATIVE 03/15/2021 1740   BILIRUBINUR Negative 04/05/2021 [place] 03/15/2021 1740   PROTEINUR Negative 04/05/2021 1208   PROTEINUR 30 (A) 03/15/2021 1740   UROBILINOGEN 0.2 08/18/2019 1326   UROBILINOGEN 0.2 04/08/2008 1454   NITRITE  Negative 04/05/2021 1208   NITRITE NEGATIVE 03/15/2021 1740   LEUKOCYTESUR Trace (A) 04/05/2021 1208   LEUKOCYTESUR LARGE (A) 03/15/2021 1740    Lab Results  Component Value Date   LABMICR See below: 04/05/2021   WBCUA 0-5 04/05/2021   LABEPIT 0-10 04/05/2021   MUCUS Present 03/23/2021   BACTERIA Few 04/05/2021  Pertinent Imaging:  No results found for this or any previous visit.  No results found for this or any previous visit.  No results found for this or any previous visit.  No results found for this or any previous visit.  No results found for this or any previous visit.  No results found for this or any previous visit.  No results found for this or any previous visit.  No results found for this or any previous visit.   Assessment & Plan:    1. Pelvic pain after stent removal Patient reassured and she will continue to drink water to dilute her urine and she will continue tramadol $RemoveBeforeDEI'50mg'[id number]$  prn. RTC 1 week - BLADDER SCAN AMB NON-IMAGING   No follow-ups on file.  [name], MD  [facility] [place]

## 2021-04-09 NOTE — Patient Instructions (Signed)
Ureteral Stent Implantation, Care After This sheet gives you information about how to care for yourself after your procedure. Your health care provider may also give you more specific instructions. If you have problems or questions, contact your health care provider. What can I expect after the procedure? After the procedure, it is common to have: Nausea. Mild pain when you urinate. You may feel this pain in your lower back or lower abdomen. The pain should stop within a few minutes after you urinate. This may last for up to 1 week. A small amount of blood in your urine for several days. Follow these instructions at home: Medicines Take over-the-counter and prescription medicines only as told by your health care provider. If you were prescribed an antibiotic medicine, take it as told by your health care provider. Do not stop taking the antibiotic even if you start to feel better. Do not drive for 24 hours if you were given a sedative during your procedure. Ask your health care provider if the medicine prescribed to you requires you to avoid driving or using heavy machinery. Activity Rest as told by your health care provider. Avoid sitting for a long time without moving. Get up to take short walks every 1-2 hours. This is important to improve blood flow and breathing. Ask for help if you feel weak or unsteady. Return to your normal activities as told by your health care provider. Ask your health care provider what activities are safe for you. General instructions  Watch for any blood in your urine. Call your health care provider if the amount of blood in your urine increases. If you have a catheter: Follow instructions from your health care provider about taking care of your catheter and collection bag. Do not take baths, swim, or use a hot tub until your health care provider approves. Ask your health care provider if you may take showers. You may only be allowed to take sponge baths. Drink  enough fluid to keep your urine pale yellow. Do not use any products that contain nicotine or tobacco, such as cigarettes, e-cigarettes, and chewing tobacco. These can delay healing after surgery. If you need help quitting, ask your health care provider. Keep all follow-up visits as told by your health care provider. This is important. Contact a health care provider if: You have pain that gets worse or does not get better with medicine, especially pain when you urinate. You have difficulty urinating. You feel nauseous or you vomit repeatedly during a period of more than 2 days after the procedure. Get help right away if: Your urine is dark red or has blood clots in it. You are leaking urine (have incontinence). The end of the stent comes out of your urethra. You cannot urinate. You have sudden, sharp, or severe pain in your abdomen or lower back. You have a fever. You have swelling or pain in your legs. You have difficulty breathing. Summary After the procedure, it is common to have mild pain when you urinate that goes away within a few minutes after you urinate. This may last for up to 1 week. Watch for any blood in your urine. Call your health care provider if the amount of blood in your urine increases. Take over-the-counter and prescription medicines only as told by your health care provider. Drink enough fluid to keep your urine pale yellow. This information is not intended to replace advice given to you by your health care provider. Make sure you discuss any questions you have  with your health care provider. Document Revised: 11/11/2017 Document Reviewed: 11/12/2017 Elsevier Patient Education  2022 [employer].

## 2021-04-10 ENCOUNTER — Ambulatory Visit (INDEPENDENT_AMBULATORY_CARE_PROVIDER_SITE_OTHER): Payer: Medicare Other | Admitting: Family Medicine

## 2021-04-10 ENCOUNTER — Other Ambulatory Visit: Payer: Self-pay

## 2021-04-10 VITALS — BP 136/62 | HR 95 | Temp 98.9°F | Ht 64.0 in | Wt 132.0 lb

## 2021-04-10 DIAGNOSIS — B9789 Other viral agents as the cause of diseases classified elsewhere: Secondary | ICD-10-CM | POA: Diagnosis not present

## 2021-04-10 DIAGNOSIS — J988 Other specified respiratory disorders: Secondary | ICD-10-CM | POA: Diagnosis not present

## 2021-04-10 MED ORDER — BENZONATATE 200 MG PO CAPS: 200.0000 mg | ORAL_CAPSULE | Freq: Three times a day (TID) | ORAL | 0 refills | Status: DC | PRN

## 2021-04-10 MED ORDER — CETIRIZINE HCL 5 MG PO TABS
5.0000 mg | ORAL_TABLET | Freq: Every day | ORAL | 0 refills | Status: DC
Start: 2021-04-10 — End: 2021-05-07

## 2021-04-10 MED ORDER — AZELASTINE-FLUTICASONE 137-50 MCG/ACT NA SUSP: 1.0000 | Freq: Two times a day (BID) | NASAL | 0 refills | Status: DC

## 2021-04-10 NOTE — Patient Instructions (Signed)
Medication as prescribed.  Call with concerns.  Okay to get your scan tomorrow.  Take care  Dr. Lacinda Axon

## 2021-04-11 ENCOUNTER — Ambulatory Visit (HOSPITAL_COMMUNITY)
Admission: RE | Admit: 2021-04-11 | Discharge: 2021-04-11 | Disposition: A | Discharge status: Home / Self Care | Payer: Medicare Other | Source: Ambulatory Visit | Attending: Physician Assistant | Admitting: Physician Assistant

## 2021-04-11 DIAGNOSIS — N200 Calculus of kidney: Secondary | ICD-10-CM

## 2021-04-11 DIAGNOSIS — B9789 Other viral agents as the cause of diseases classified elsewhere: Secondary | ICD-10-CM | POA: Insufficient documentation

## 2021-04-11 DIAGNOSIS — J988 Other specified respiratory disorders: Secondary | ICD-10-CM | POA: Insufficient documentation

## 2021-04-11 DIAGNOSIS — N281 Cyst of kidney, acquired: Secondary | ICD-10-CM | POA: Diagnosis not present

## 2021-04-11 DIAGNOSIS — N1330 Unspecified hydronephrosis: Secondary | ICD-10-CM | POA: Diagnosis not present

## 2021-04-11 NOTE — Assessment & Plan Note (Signed)
Treating with azelastine/fluticasone nasal spray, Zyrtec, Tessalon Perles.

## 2021-04-11 NOTE — Progress Notes (Signed)
Subjective:  Patient ID: Deborah Gray, female    DOB: [birth year]  Age: 88 y.o. MRN: [id number]  CC: Chief Complaint  Patient presents with   Sinusitis    Drainage is blistering nostrils , just completed augmentin x 1.5 week   dry cough     Phlegm not going away even with mucinex    HPI:  88 year old female presents for evaluation of the above.  Symptoms started on Sunday.  She reports runny nose and congestion.  She reports mild cough.  No fever.  She has been taking Mucinex with improvement but no resolution.  Patient is concerned that she has an upcoming CT scan.  No other associated symptoms.  No other complaints.  Patient Active Problem List   Diagnosis Date Noted   Viral respiratory illness 04/11/2021   UTI (urinary tract infection) 03/15/2021   Nephrolithiasis 03/15/2021   Prolapsed internal hemorrhoids, grade 3 01/23/2021   Seasonal allergies 01/18/2020   IBS (irritable bowel syndrome) 01/30/2015   Type 2 diabetes mellitus ([place]) 12/22/2014   Hyperlipidemia, unspecified 12/22/2014   Essential hypertension 11/25/2013   Osteopenia 10/23/2012   Chronic pancreatitis ([place]) 02/26/2011   GERD (gastroesophageal reflux disease) 01/18/2011   Esophageal spasm 11/29/2010   DJD (degenerative joint disease) 11/29/2010   Hx of adenomatous colonic polyps 06/29/2009    Social Hx   Social History   Socioeconomic History   Marital status: Married    Spouse name: Not on file   Number of children: 3   Years of education: Not on file   Highest education level: Not on file  Occupational History   Occupation: Retired  Tobacco Use   Smoking status: Never   Smokeless tobacco: Never   Tobacco comments:    Never smoked  Vaping Use   Vaping Use: Never used  Substance and Sexual Activity   Alcohol use: No    Alcohol/week: 0.0 standard drinks   Drug use: No   Sexual activity: Not on file  Other Topics Concern   Not on file  Social History Narrative   Not on file    Social Determinants of Health   Financial Resource Strain: Not on file  Food Insecurity: Not on file  Transportation Needs: Not on file  Physical Activity: Not on file  Stress: Not on file  Social Connections: Not on file    Review of Systems Per HPI  Objective:  BP 136/62    Pulse 95    Temp 98.9 F (37.2 C)    Ht 5\' 4"  (1.626 m)    Wt 132 lb (59.9 kg)    SpO2 98%    BMI 22.66 kg/m   BP/Weight 04/10/2021 04/05/2021 8/50/2774  Systolic BP 128 786 767  Diastolic BP 62 63 63  Wt. (Lbs) 132 - -  BMI 22.66 - -    Physical Exam Vitals and nursing note reviewed.  Constitutional:      General: She is not in acute distress.    Appearance: Normal appearance. She is not ill-appearing.  HENT:     Head: Normocephalic and atraumatic.     Nose: No rhinorrhea.     Mouth/Throat:     Pharynx: Oropharynx is clear.  Cardiovascular:     Rate and Rhythm: Normal rate and regular rhythm.  Pulmonary:     Effort: Pulmonary effort is normal.     Breath sounds: Normal breath sounds. No wheezing, rhonchi or rales.  Neurological:     Mental Status: She is alert.  Psychiatric:        Mood and Affect: Mood normal.        Behavior: Behavior normal.    Lab Results  Component Value Date   WBC 9.7 03/27/2021   HGB 12.8 03/27/2021   HCT 38.5 03/27/2021   PLT 363 03/27/2021   GLUCOSE 102 (H) 03/27/2021   CHOL 168 11/29/2020   TRIG 92 11/29/2020   HDL 68 11/29/2020   LDLCALC 83 11/29/2020   ALT 13 03/27/2021   AST 12 03/27/2021   NA 139 03/27/2021   K 5.3 (H) 03/27/2021   CL 102 03/27/2021   CREATININE 0.75 03/27/2021   BUN 23 03/27/2021   CO2 24 03/27/2021   TSH 1.735 11/07/2017   INR 1.7 (H) 04/08/2008   HGBA1C 6.1 (H) 03/15/2021   MICROALBUR 1.0 11/22/2013     Assessment & Plan:   Problem List Items Addressed This Visit       Respiratory   Viral respiratory illness - Primary    Treating with azelastine/fluticasone nasal spray, Zyrtec, Tessalon Perles.       Meds  ordered this encounter  Medications   Azelastine-Fluticasone 137-50 MCG/ACT SUSP    Sig: Place 1 spray into the nose every 12 (twelve) hours.    Dispense:  23 g    Refill:  0   cetirizine (ZYRTEC) 5 MG tablet    Sig: Take 1 tablet (5 mg total) by mouth daily.    Dispense:  30 tablet    Refill:  0   benzonatate (TESSALON) 200 MG capsule    Sig: Take 1 capsule (200 mg total) by mouth 3 (three) times daily as needed for cough.    Dispense:  30 capsule    Refill:  [place]

## 2021-04-12 ENCOUNTER — Telehealth: Payer: Self-pay | Admitting: Family Medicine

## 2021-04-12 ENCOUNTER — Other Ambulatory Visit: Payer: Self-pay | Admitting: Family Medicine

## 2021-04-12 MED ORDER — AMOXICILLIN-POT CLAVULANATE 875-125 MG PO TABS
1.0000 | ORAL_TABLET | Freq: Two times a day (BID) | ORAL | 0 refills | Status: DC
Start: 2021-04-12 — End: 2021-04-16

## 2021-04-12 NOTE — Telephone Encounter (Signed)
Pt contacted and verbalized understanding.  

## 2021-04-12 NOTE — Telephone Encounter (Signed)
Pt was seen 04/10/21 for viral respiratory illness. Pt states she is not getting any better and her mucus is not thinning out any. Pt is taking allergy meds at night and when she wakes up her throat is full of phelgm and her throat burns. Pt states she is having trouble getting the phelgm up. Pt was unable to get nasal spray due to price ($110); has been using nasal spray that Dr.Teoh prescribed. Please advise. Thank you.

## 2021-04-12 NOTE — Progress Notes (Signed)
Patient not improving.  Feels like she is worsening.  Sending in empiric Augmentin.  [place]

## 2021-04-16 ENCOUNTER — Telehealth: Payer: Self-pay | Admitting: Internal Medicine

## 2021-04-16 ENCOUNTER — Telehealth: Payer: Self-pay | Admitting: *Deleted

## 2021-04-16 ENCOUNTER — Other Ambulatory Visit: Payer: Self-pay | Admitting: Family Medicine

## 2021-04-16 ENCOUNTER — Telehealth: Payer: Self-pay

## 2021-04-16 DIAGNOSIS — N200 Calculus of kidney: Secondary | ICD-10-CM

## 2021-04-16 MED ORDER — AMOXICILLIN 875 MG PO TABS: 875.0000 mg | ORAL_TABLET | Freq: Two times a day (BID) | ORAL | 0 refills | Status: AC

## 2021-04-16 MED ORDER — DOXYCYCLINE HYCLATE 100 MG PO TABS: 100.0000 mg | ORAL_TABLET | Freq: Two times a day (BID) | ORAL | 0 refills | Status: DC

## 2021-04-16 NOTE — Telephone Encounter (Signed)
Patient notified

## 2021-04-16 NOTE — Telephone Encounter (Signed)
Patient called about ultrasound results. Reviewed u/s with Dr. Alyson Ingles, per Dr. Herbie Drape okay and repeat in 3 months with [facility].  Patient asked about resuming IC treatments- per Dr. Alyson Ingles, okay to resume treatments.

## 2021-04-16 NOTE — Telephone Encounter (Signed)
Pt is currently on Augmentin which is known to cause diarrhea. Informed pt to contact PCP to see if she can be switched to a different antibiotic. Pt verbalized understanding.

## 2021-04-16 NOTE — Telephone Encounter (Signed)
Coral Spikes, DO    I sent in Amoxicillin.

## 2021-04-16 NOTE — Telephone Encounter (Signed)
Patient would like to speak to a nurse, having bad diarrhea and is currently taking antibiotics.  Wanted to know if that could be causing it

## 2021-04-16 NOTE — Telephone Encounter (Signed)
Patient called and stated she is on Augmentin for sinus sx and it is giving her bad diarrhea. Patient states she called her stomach doctor and they told her she should probably get a different antibiotic. Patient states she is still having sinus sx so she does want to take another antibiotic that is easier on the stomach.

## 2021-04-16 NOTE — Telephone Encounter (Signed)
Please let pt know that if her diarrhea does not improve and BMs go back to baseline that she should let us know. If diarrhea persists, we would check her for Cdiff.

## 2021-04-16 NOTE — Telephone Encounter (Signed)
Prescription sent electronically to pharmacy. Patient stated she is unable to take doxycycline due to her stomach issues- patient states she has take plain Amoxicillin before without an issue

## 2021-04-16 NOTE — Telephone Encounter (Signed)
Pt was made aware and verbalized understanding.  

## 2021-04-19 ENCOUNTER — Other Ambulatory Visit: Payer: Self-pay | Admitting: Family Medicine

## 2021-04-19 ENCOUNTER — Telehealth: Payer: Self-pay | Admitting: *Deleted

## 2021-04-19 MED ORDER — PROMETHAZINE-DM 6.25-15 MG/5ML PO SYRP
5.0000 mL | ORAL_SOLUTION | Freq: Four times a day (QID) | ORAL | 0 refills | Status: DC | PRN
Start: 2021-04-19 — End: 2021-05-07

## 2021-04-19 NOTE — Telephone Encounter (Signed)
Patient notified

## 2021-04-19 NOTE — Telephone Encounter (Signed)
Cook, Jayce G, DO   ? ?Rx sent.   ? ?

## 2021-04-19 NOTE — Telephone Encounter (Signed)
Patient requesting medication for cough- Patient states she had to take the tessalon perles 4 times a day instead of 3 times a day and will need a refill or something else to help with cough ? ?[facility] ?

## 2021-04-23 NOTE — Progress Notes (Signed)
Sent via mail 

## 2021-04-25 ENCOUNTER — Ambulatory Visit (INDEPENDENT_AMBULATORY_CARE_PROVIDER_SITE_OTHER): Payer: Medicare Other | Admitting: Physician Assistant

## 2021-04-25 ENCOUNTER — Other Ambulatory Visit: Payer: Self-pay

## 2021-04-25 VITALS — BP 171/73 | HR 105 | Ht 64.0 in | Wt 135.0 lb

## 2021-04-25 DIAGNOSIS — Z87440 Personal history of urinary (tract) infections: Secondary | ICD-10-CM | POA: Diagnosis not present

## 2021-04-25 DIAGNOSIS — R8271 Bacteriuria: Secondary | ICD-10-CM

## 2021-04-25 DIAGNOSIS — R300 Dysuria: Secondary | ICD-10-CM

## 2021-04-25 LAB — MICROSCOPIC EXAMINATION

## 2021-04-25 LAB — URINALYSIS, ROUTINE W REFLEX MICROSCOPIC
Bilirubin, UA: NEGATIVE
Glucose, UA: NEGATIVE
Ketones, UA: NEGATIVE
Nitrite, UA: NEGATIVE
Protein,UA: NEGATIVE
RBC, UA: NEGATIVE
Specific Gravity, UA: 1.02 (ref 1.005–1.030)
Urobilinogen, Ur: 0.2 mg/dL (ref 0.2–1.0)
pH, UA: 6.5 (ref 5.0–7.5)

## 2021-04-25 MED ORDER — NITROFURANTOIN MONOHYD MACRO 100 MG PO CAPS: 100.0000 mg | ORAL_CAPSULE | Freq: Two times a day (BID) | ORAL | 0 refills | Status: AC

## 2021-04-25 NOTE — Progress Notes (Signed)
Assessment: 1. Dysuria   2. Bacteriuria   3. History of recurrent UTIs     Plan: She will resume new prescription for Macrobid twice daily for 7 days.  Urine culture ordered.  Keep scheduled follow-up visit with Dr. Alyson Ingles as planned.  Chief Complaint: "I think I have an infection"  HPI: 04/25/21 Deborah Gray is a 88 y.o. female who presents for evaluation of 2 day h/o increase in nocturia and daytime frequency/dysuria. No fever, chills, NV. No new abdominal pain. No hematuria. She recently completed a course of Amoxil for sinusitis. The pt has funeral services tomorrow and does not want to have a UTI that would interfere with her attendance.  Urine culture obtained on 04/05/2021 indicated no significant growth.  UA 6-10 WBCs, few bacteria, nitrite negative  04/05/21 Deborah Gray is a 88 y.o. female who presents for continued evaluation of pelvic discomfort and difficulty voiding since stent removal last week.  She reports feeling much better and is quite pleased with her progress without complaints.  She has no dysuria, burning, gross hematuria at this time.  No fever or chills. Weight = 0-5 WBCs, 0-2 RBCs, few bacteria.   03/30/21 Deborah Gray is a 88yo here today with worsening pelvic pain and difficulty urinating after ureteral stent removal. She underwent ureteroscopic stone extraction yesterday and removed her stent last night. She then developed a weak urinary stream and difficulty urinating last night. She was able to urinate this morning with partial alleviation of her pelvic pain. She denies any gross hematuria. No worsening urinary urgency, frequency. No fevers.  Portions of the above documentation were copied from a prior visit for review purposes only.  Allergies: Allergies  Allergen Reactions   Adhesive [Tape] Other (See Comments)    Reaction:  Blisters    Estrogens Other (See Comments)    Reaction:  Migraines    Sulfonamide Derivatives Rash     PMH: Past Medical History:  Diagnosis Date   Arthritis    Benign neoplasm of colon    Constipation    Diaphragmatic hernia without mention of obstruction or gangrene    Diverticulosis of colon (without mention of hemorrhage)    Dysphagia, pharyngoesophageal phase    Esophageal reflux    Essential hypertension    Heart murmur    Hemorrhoids    Hyperlipidemia    Internal hemorrhoids without mention of complication    Left wrist fracture    PONV (postoperative nausea and vomiting)    Stricture and stenosis of esophagus    Type 2 diabetes mellitus (HCC)    Unspecified gastritis and gastroduodenitis without mention of hemorrhage     PSH: Past Surgical History:  Procedure Laterality Date   ABDOMINAL HYSTERECTOMY     BACTERIAL OVERGROWTH TEST N/A 09/06/2015   Procedure: BACTERIAL OVERGROWTH TEST;  Surgeon: Daneil Dolin, MD;  [facility]: [facility];  Service: Endoscopy;  Laterality: N/A;  800   BREAST LUMPECTOMY Right    benign   CATARACT EXTRACTION Bilateral 2006   Implants in both, surgeries done 6 weeks apart   CHOLECYSTECTOMY     COLONOSCOPY  03/08/02   Deborah Gray: diverticulosis, internal hemorrhoids, adenomatous colon polyp   COLONOSCOPY  01/23/04   Deborah Gray: diverticulosis, internal hemorrhoids   COLONOSCOPY  09/25/05   Deborah Gray: diverticulosis   COLONOSCOPY  07/05/09   Deborah Gray: severe diverticulosis   COLONOSCOPY  01/21/11   Deborah Gray: severe diverticulosis in sigmoid to desc colon, int hemorrhoids, follow up TCS in 5 years  COLONOSCOPY N/A 04/10/2016   Deborah Gray: diverticulosis   CYSTOSCOPY W/ URETERAL STENT PLACEMENT Right 03/15/2021   Procedure: CYSTOSCOPY WITH RETROGRADE PYELOGRAM/URETERAL STENT PLACEMENT;  Surgeon: Janith Lima, MD;  Location: [facility];  Service: Urology;  Laterality: Right;   CYSTOSCOPY WITH RETROGRADE PYELOGRAM, URETEROSCOPY AND STENT PLACEMENT Right 03/29/2021   Procedure: CYSTOSCOPY WITH RETROGRADE PYELOGRAM, URETEROSCOPY AND STENT EXCHANGE;   Surgeon: Cleon Gustin, MD;  Location: [facility];  Service: Urology;  Laterality: Right;   DILATION AND CURETTAGE OF UTERUS     ESOPHAGEAL MANOMETRY  03/21/08   [place]: findings c/w [place] Esophagus   ESOPHAGOGASTRODUODENOSCOPY  03/08/02   Deborah Gray: esophageal stricture, chronic gerd, s/p dilation.    ESOPHAGOGASTRODUODENOSCOPY  01/23/04   Deborah Gray: esophageal stricture, gastritis, hiatal hernia   ESOPHAGOGASTRODUODENOSCOPY  09/25/05   Deborah Gray: gastritis, benign bx, no H.pylori   ESOPHAGOGASTRODUODENOSCOPY  07/05/09   Deborah Gray: gastropathy, benign small bowel bx and gastric bx   ESOPHAGOGASTRODUODENOSCOPY N/A 05/15/2015   Dr. Gala Gray- diffuse moderate inflammation characterized by congestion (edema), erythema, and linear erosions was found in the entire examined stomach. bx= reactive gastropathy   FOOT SURGERY Left    HEMORRHOID BANDING  2017   Dr.[name]   HOLMIUM LASER APPLICATION Right 03/21/3084   Procedure: HOLMIUM LASER APPLICATION;  Surgeon: Cleon Gustin, MD;  Location: [facility];  Service: Urology;  Laterality: Right;   LAPAROSCOPIC VAGINAL HYSTERECTOMY     ORIF WRIST FRACTURE Left 06/23/2018   Procedure: OPEN REDUCTION INTERNAL FIXATION (ORIF) LEFT WRIST FRACTURE;  Surgeon: Deborah Butters, MD;  Location: [facility];  Service: Orthopedics;  Laterality: Left;  regional arm block   RECTOCELE REPAIR     TOTAL KNEE ARTHROPLASTY Right     SH: Social History   Tobacco Use   Smoking status: Never   Smokeless tobacco: Never   Tobacco comments:    Never smoked  Vaping Use   Vaping Use: Never used  Substance Use Topics   Alcohol use: No    Alcohol/week: 0.0 standard drinks   Drug use: No    ROS: Constitutional:  Negative for fever, chills, weight loss CV: Negative for chest pain, previous MI, hypertension Respiratory:  Negative for shortness of breath, wheezing, sleep apnea, frequent cough GI:  Negative for nausea, vomiting, bloody stool,  GERD  PE: BP (!) 171/73    Pulse (!) 105    Ht '5\' 4"'$  (1.626 m)    Wt 135 lb (61.2 kg)    BMI 23.17 kg/m  GENERAL APPEARANCE:  Well appearing, well developed, well nourished, NAD HEENT:  Atraumatic, normocephalic NECK:  Supple. Trachea midline ABDOMEN:  Soft, non-tender, no masses EXTREMITIES:  Moves all extremities well, without clubbing, cyanosis, or edema NEUROLOGIC:  Alert and oriented x 3, normal gait, CN II-XII grossly intact MENTAL STATUS:  appropriate BACK:  Non-tender to palpation, No CVAT SKIN:  Warm, dry, and intact   Results: Laboratory Data: Lab Results  Component Value Date   WBC 9.7 03/27/2021   HGB 12.8 03/27/2021   HCT 38.5 03/27/2021   MCV 86 03/27/2021   PLT 363 03/27/2021    Lab Results  Component Value Date   CREATININE 0.75 03/27/2021    No results found for: PSA  No results found for: TESTOSTERONE  Lab Results  Component Value Date   HGBA1C 6.1 (H) 03/15/2021    Urinalysis    Component Value Date/Time   COLORURINE YELLOW 03/15/2021 1740   APPEARANCEUR Clear 04/05/2021 1208   LABSPEC 1.015 03/15/2021 1740  PHURINE 5.0 03/15/2021 1740   GLUCOSEU Negative 04/05/2021 1208   HGBUR NEGATIVE 03/15/2021 1740   BILIRUBINUR Negative 04/05/2021 [place] 03/15/2021 1740   PROTEINUR Negative 04/05/2021 1208   PROTEINUR 30 (A) 03/15/2021 1740   UROBILINOGEN 0.2 08/18/2019 1326   UROBILINOGEN 0.2 04/08/2008 1454   NITRITE Negative 04/05/2021 1208   NITRITE NEGATIVE 03/15/2021 1740   LEUKOCYTESUR Trace (A) 04/05/2021 1208   LEUKOCYTESUR LARGE (A) 03/15/2021 1740    Lab Results  Component Value Date   LABMICR See below: 04/05/2021   WBCUA 0-5 04/05/2021   LABEPIT 0-10 04/05/2021   MUCUS Present 03/23/2021   BACTERIA Few 04/05/2021    Pertinent Imaging: No results found for this or any previous visit.  No results found for this or any previous visit.  No results found for this or any previous visit.  No results found  for this or any previous visit.  Results for orders placed during the hospital encounter of 04/11/21  Ultrasound renal complete  Narrative CLINICAL DATA:  Nephrolithiasis.  Recent urologic procedures.  EXAM: RENAL / URINARY TRACT ULTRASOUND COMPLETE  COMPARISON:  CT [facility], 03/15/2021. US Renal, 10/28/2008. Intraoperative fluoroscopy, 03/16/2021.  FINDINGS: Suboptimal evaluation, secondary to shadowing from overlying bowel gas.  Right Kidney:  Renal measurements: 12.4 x 4.7 x 5.4 cm = volume: 164 mL. Mild diffuse renal cortical thinning. Echogenicity within normal limits. Mild pelvicaliectasis. No hydronephrosis.  Exophytic renal cortical cyst measuring up to 3.7 cm  Left Kidney:  Renal measurements: 10.4 x 6.5 x 5.2 cm = volume: 183 mL. Echogenicity within normal limits. No hydronephrosis.  Exophytic renal cysts, measuring up to 3.3 and 2.5 cm.  Bladder:  Appears normal for degree of bladder distention. Urinary jets are visualized. Postvoid imaging was not obtained.  Other:  None.  IMPRESSION: 1. Mild RIGHT pelvicaliectasis, without discrete hydronephrosis, after ureteral stent placement. 2. Senescent renal changes with mild RIGHT cortical thinning and bilateral renal cysts, as above.   Electronically Signed By: Michaelle Birks M.D. On: 04/13/2021 11:51  No results found for this or any previous visit.  No results found for this or any previous visit.  No results found for this or any previous visit.  No results found for this or any previous visit (from the past 24 hour(s)).

## 2021-04-28 LAB — URINE CULTURE

## 2021-04-30 ENCOUNTER — Other Ambulatory Visit: Payer: Self-pay

## 2021-04-30 ENCOUNTER — Telehealth: Payer: Self-pay

## 2021-04-30 ENCOUNTER — Ambulatory Visit (INDEPENDENT_AMBULATORY_CARE_PROVIDER_SITE_OTHER): Payer: Medicare Other

## 2021-04-30 DIAGNOSIS — N3010 Interstitial cystitis (chronic) without hematuria: Secondary | ICD-10-CM

## 2021-04-30 LAB — URINALYSIS, ROUTINE W REFLEX MICROSCOPIC
Bilirubin, UA: NEGATIVE
Glucose, UA: NEGATIVE
Ketones, UA: NEGATIVE
Leukocytes,UA: NEGATIVE
Nitrite, UA: NEGATIVE
Protein,UA: NEGATIVE
RBC, UA: NEGATIVE
Specific Gravity, UA: 1.02 (ref 1.005–1.030)
Urobilinogen, Ur: 0.2 mg/dL (ref 0.2–1.0)
pH, UA: 6 (ref 5.0–7.5)

## 2021-04-30 MED ORDER — LIDOCAINE HCL 2 % IJ SOLN
10.0000 mL | Freq: Once | INTRAMUSCULAR | Status: AC
  Administered 2021-04-30: 200 mg

## 2021-04-30 MED ORDER — DIMETHYL SULFOXIDE 50 % IS SOLN
50.0000 mL | Freq: Once | INTRAVESICAL | Status: AC
  Administered 2021-04-30: 50 mL via URETHRAL

## 2021-04-30 MED ORDER — SODIUM BICARBONATE 8.4 % IV SOLN
50.0000 meq | Freq: Once | INTRAVENOUS | Status: AC
  Administered 2021-04-30: 50 meq

## 2021-04-30 MED ORDER — HYDROCORTISONE SOD SUCCINATE 100 MG PF FOR IT USE
100.0000 mg | Freq: Once | INTRAMUSCULAR | Status: AC
  Administered 2021-04-30: 100 mg via INTRATHECAL

## 2021-04-30 NOTE — Telephone Encounter (Signed)
-----   Message from Reynaldo Minium, [place] sent at 04/30/2021 12:24 PM EDT ----- ?Culture had insignificant growth. FU if sxs persist for UA. Pt should have a couple days of antibx left to complete ?----- Message ----- ?From: Interface, [facility] Lab Results In ?Sent: 04/25/2021   3:36 PM EDT ?To: Berneice Heinrich [place], PA-C ? ? ?

## 2021-04-30 NOTE — Telephone Encounter (Signed)
Patient in office today and made aware. ?

## 2021-04-30 NOTE — Progress Notes (Signed)
Bladder Instillation DMSO ? ?Due to IC patient is present today for a Bladder Instillation of DMSO treatment. Patient was cleaned and prepped in a sterile fashion with betadine.  A 18FR catheter was inserted, urine return was noted 64m, urine was yellow in color.  DMSO treatment was instilled into the bladder. The catheter was then removed. Patient tolerated well, no complications were noted pt was instructed to hold the instillation for 20 minutes and then will void it out. Pt voiced understanding.  ? ? ?Preformed by: AEstill BambergRN ? ?Follow up/ Additional notes: 1 month DMSO treatment   ?

## 2021-05-07 ENCOUNTER — Other Ambulatory Visit: Payer: Self-pay

## 2021-05-07 ENCOUNTER — Ambulatory Visit (INDEPENDENT_AMBULATORY_CARE_PROVIDER_SITE_OTHER): Payer: Medicare Other | Admitting: Family Medicine

## 2021-05-07 VITALS — BP 140/60 | HR 101 | Temp 97.8°F | Wt 133.0 lb

## 2021-05-07 DIAGNOSIS — R61 Generalized hyperhidrosis: Secondary | ICD-10-CM | POA: Insufficient documentation

## 2021-05-07 DIAGNOSIS — E119 Type 2 diabetes mellitus without complications: Secondary | ICD-10-CM | POA: Diagnosis not present

## 2021-05-07 DIAGNOSIS — E785 Hyperlipidemia, unspecified: Secondary | ICD-10-CM

## 2021-05-07 NOTE — Patient Instructions (Signed)
Labs today.  ? ?We will call with the results. ? ?Take care ? ?Dr. Lacinda Axon  ?

## 2021-05-07 NOTE — Assessment & Plan Note (Addendum)
Patient endorsing night sweats over the past year.  Minimal weight loss.  She has no other associated symptoms.  Proceeding with labs for further evaluation. ?

## 2021-05-07 NOTE — Progress Notes (Signed)
? ?Subjective:  ?Patient ID: Deborah Gray, female    DOB: January 01, 1934  Age: 88 y.o. MRN: [id number] ? ?CC: ?Chief Complaint  ?Patient presents with  ? Night Sweats  ?  Has been going on for over year.  Pt was told she needed to get blood work done.  ? ? ?HPI: ? ?88 year old female with with hypertension, GERD, chronic pancreatitis/pancreatic insufficiency, type 2 diabetes, IBS, nephrolithiasis, hyperlipidemia presents with complaints of night sweats. ? ?Patient reports that she has had night sweats over the past year.  She recently expressed this to urology PA who advised her to come in for evaluation.  Patient states that she often times will drench her nightgown at night.  Patient states that she uses a heated blanket at night because she is typically cold.  She has had approximately 5 pound weight loss since our last visit.  Some of this is likely due to the fact that at her living facility a lot of the food does not agree with her.  Denies chest pain or shortness of breath.  Patient states that she is no longer getting mammograms.  Last mammogram was 2021.  Patient is requesting laboratory studies for further evaluation of night sweats. ? ?Patient Active Problem List  ? Diagnosis Date Noted  ? Night sweats 05/07/2021  ? Nephrolithiasis 03/15/2021  ? Prolapsed internal hemorrhoids, grade 3 01/23/2021  ? IBS (irritable bowel syndrome) 01/30/2015  ? Type 2 diabetes mellitus ([place]) 12/22/2014  ? Hyperlipidemia, unspecified 12/22/2014  ? Essential hypertension 11/25/2013  ? Osteopenia 10/23/2012  ? Chronic pancreatitis ([place]) 02/26/2011  ? GERD (gastroesophageal reflux disease) 01/18/2011  ? Esophageal spasm 11/29/2010  ? DJD (degenerative joint disease) 11/29/2010  ? Hx of adenomatous colonic polyps 06/29/2009  ? ? ?Social Hx   ?Social History  ? ?Socioeconomic History  ? Marital status: Married  ?  Spouse name: Not on file  ? Number of children: 3  ? Years of education: Not on file  ? Highest education level: Not  on file  ?Occupational History  ? Occupation: Retired  ?Tobacco Use  ? Smoking status: Never  ? Smokeless tobacco: Never  ? Tobacco comments:  ?  Never smoked  ?Vaping Use  ? Vaping Use: Never used  ?Substance and Sexual Activity  ? Alcohol use: No  ?  Alcohol/week: 0.0 standard drinks  ? Drug use: No  ? Sexual activity: Not on file  ?Other Topics Concern  ? Not on file  ?Social History Narrative  ? Not on file  ? ?Social Determinants of Health  ? ?Financial Resource Strain: Not on file  ?Food Insecurity: Not on file  ?Transportation Needs: Not on file  ?Physical Activity: Not on file  ?Stress: Not on file  ?Social Connections: Not on file  ? ? ?Review of Systems ?Per HPI ? ?Objective:  ?BP 140/60   Pulse (!) 101   Temp 97.8 ?F (36.6 ?C) (Oral)   Wt 133 lb (60.3 kg)   SpO2 100%   BMI 22.83 kg/m?  ? ?BP/Weight 05/07/2021 04/25/2021 04/10/2021  ?Systolic BP 712 458 099  ?Diastolic BP 60 73 62  ?Wt. (Lbs) 133 135 132  ?BMI 22.83 23.17 22.66  ? ? ?Physical Exam ?Vitals and nursing note reviewed.  ?Constitutional:   ?   General: She is not in acute distress. ?   Appearance: Normal appearance. She is not ill-appearing.  ?HENT:  ?   Head: Normocephalic and atraumatic.  ?Eyes:  ?   General:     ?  Right eye: No discharge.     ?   Left eye: No discharge.  ?   Conjunctiva/sclera: Conjunctivae normal.  ?Cardiovascular:  ?   Rate and Rhythm: Normal rate and regular rhythm.  ?Pulmonary:  ?   Effort: Pulmonary effort is normal.  ?   Breath sounds: Normal breath sounds. No wheezing, rhonchi or rales.  ?Neurological:  ?   Mental Status: She is alert.  ?Psychiatric:     ?   Mood and Affect: Mood normal.     ?   Behavior: Behavior normal.  ? ? ?Lab Results  ?Component Value Date  ? WBC 9.7 03/27/2021  ? HGB 12.8 03/27/2021  ? HCT 38.5 03/27/2021  ? PLT 363 03/27/2021  ? GLUCOSE 102 (H) 03/27/2021  ? CHOL 168 11/29/2020  ? TRIG 92 11/29/2020  ? HDL 68 11/29/2020  ? [place] 83 11/29/2020  ? ALT 13 03/27/2021  ? AST 12 03/27/2021  ?  NA 139 03/27/2021  ? K 5.3 (H) 03/27/2021  ? CL 102 03/27/2021  ? CREATININE 0.75 03/27/2021  ? BUN 23 03/27/2021  ? CO2 24 03/27/2021  ? TSH 1.735 11/07/2017  ? INR 1.7 (H) 04/08/2008  ? HGBA1C 6.1 (H) 03/15/2021  ? MICROALBUR 1.0 11/22/2013  ? ? ? ?Assessment & Plan:  ? ?Problem List Items Addressed This Visit   ? ?  ? Endocrine  ? Type 2 diabetes mellitus ([place])  ? Relevant Orders  ? CMP14+EGFR  ?  ? Other  ? Hyperlipidemia, unspecified  ? Relevant Orders  ? Lipid panel  ? Night sweats - Primary  ?  Patient endorsing night sweats over the past year.  Minimal weight loss.  She has no other associated symptoms.  Proceeding with labs for further evaluation. ?  ?  ? Relevant Orders  ? CBC with Differential  ? TSH  ? C-reactive protein  ? ? ?Thersa Salt DO ?[place] ? ?

## 2021-05-08 DIAGNOSIS — H34832 Tributary (branch) retinal vein occlusion, left eye, with macular edema: Secondary | ICD-10-CM | POA: Diagnosis not present

## 2021-05-08 LAB — CBC WITH DIFFERENTIAL/PLATELET
Basophils Absolute: 0 10*3/uL (ref 0.0–0.2)
Basos: 1 %
EOS (ABSOLUTE): 0.1 10*3/uL (ref 0.0–0.4)
Eos: 1 %
Hematocrit: 38.9 % (ref 34.0–46.6)
Hemoglobin: 12.9 g/dL (ref 11.1–15.9)
Immature Grans (Abs): 0 10*3/uL (ref 0.0–0.1)
Immature Granulocytes: 0 %
Lymphocytes Absolute: 1.9 10*3/uL (ref 0.7–3.1)
Lymphs: 24 %
MCH: 28.9 pg (ref 26.6–33.0)
MCHC: 33.2 g/dL (ref 31.5–35.7)
MCV: 87 fL (ref 79–97)
Monocytes Absolute: 0.6 10*3/uL (ref 0.1–0.9)
Monocytes: 7 %
Neutrophils Absolute: 5.4 10*3/uL (ref 1.4–7.0)
Neutrophils: 67 %
Platelets: 247 10*3/uL (ref 150–450)
RBC: 4.46 x10E6/uL (ref 3.77–5.28)
RDW: 15.6 % — ABNORMAL HIGH (ref 11.7–15.4)
WBC: 8 10*3/uL (ref 3.4–10.8)

## 2021-05-08 LAB — CMP14+EGFR
ALT: 15 IU/L (ref 0–32)
AST: 16 IU/L (ref 0–40)
Albumin/Globulin Ratio: 2.4 — ABNORMAL HIGH (ref 1.2–2.2)
Albumin: 4.7 g/dL — ABNORMAL HIGH (ref 3.6–4.6)
Alkaline Phosphatase: 77 IU/L (ref 44–121)
BUN/Creatinine Ratio: 49 — ABNORMAL HIGH (ref 12–28)
BUN: 33 mg/dL — ABNORMAL HIGH (ref 8–27)
Bilirubin Total: 0.4 mg/dL (ref 0.0–1.2)
CO2: 25 mmol/L (ref 20–29)
Calcium: 10.1 mg/dL (ref 8.7–10.3)
Chloride: 102 mmol/L (ref 96–106)
Creatinine, Ser: 0.67 mg/dL (ref 0.57–1.00)
Globulin, Total: 2 g/dL (ref 1.5–4.5)
Glucose: 115 mg/dL — ABNORMAL HIGH (ref 70–99)
Potassium: 5 mmol/L (ref 3.5–5.2)
Sodium: 140 mmol/L (ref 134–144)
Total Protein: 6.7 g/dL (ref 6.0–8.5)
eGFR: 86 mL/min/{1.73_m2} (ref >59)

## 2021-05-08 LAB — LIPID PANEL
Chol/HDL Ratio: 2.5 ratio (ref 0.0–4.4)
Cholesterol, Total: 179 mg/dL (ref 100–199)
HDL: 73 mg/dL (ref >39)
LDL Chol Calc (NIH): 89 mg/dL (ref 0–99)
Triglycerides: 95 mg/dL (ref 0–149)
VLDL Cholesterol Cal: 17 mg/dL (ref 5–40)

## 2021-05-08 LAB — C-REACTIVE PROTEIN: CRP: <1 mg/L (ref 0–10)

## 2021-05-08 LAB — TSH: TSH: 1.76 u[IU]/mL (ref 0.450–4.500)

## 2021-05-15 ENCOUNTER — Ambulatory Visit (INDEPENDENT_AMBULATORY_CARE_PROVIDER_SITE_OTHER): Payer: Medicare Other

## 2021-05-15 ENCOUNTER — Other Ambulatory Visit: Payer: Self-pay

## 2021-05-15 VITALS — Ht 64.0 in | Wt 133.0 lb

## 2021-05-15 DIAGNOSIS — Z0000 Encounter for general adult medical examination without abnormal findings: Secondary | ICD-10-CM

## 2021-05-15 NOTE — Progress Notes (Signed)
? ?Subjective:  ? Deborah Gray is a 88 y.o. female who presents for Medicare Annual (Subsequent) preventive examination. ?Virtual Visit via Telephone Note ? ?I connected with  Deborah Gray on 05/15/21 at  3:15 PM EDT by telephone and verified that I am speaking with the correct person using two identifiers. ? ?Location: ?Patient: HOME ?Provider: [facility] ?Persons participating in the virtual visit: patient/Nurse Health Advisor ?  ?I discussed the limitations, risks, security and privacy concerns of performing an evaluation and management service by telephone and the availability of in person appointments. The patient expressed understanding and agreed to proceed. ? ?Interactive audio and video telecommunications were attempted between this nurse and patient, however failed, due to patient having technical difficulties OR patient did not have access to video capability.  We continued and completed visit with audio only. ? ?Some vital signs may be absent or patient reported.  ? ?Deborah Driver, LPN ? ?Review of Systems    ? ?Cardiac Risk Factors include: advanced age (>6men, >78 women);diabetes mellitus;hypertension;sedentary lifestyle ? ?   ?Objective:  ?  ?Today's Vitals  ? 05/15/21 1522  ?Weight: 133 lb (60.3 kg)  ?Height: $RemoveB'5\' 4"'ogGzJWdR$  (1.626 m)  ? ?Body mass index is 22.83 kg/m?. ? ? ?  05/15/2021  ?  3:37 PM 03/29/2021  ?  9:01 AM 03/27/2021  ?  8:18 AM 03/16/2021  ?  3:22 AM 03/15/2021  ?  4:18 PM 05/25/2019  ? 12:04 PM 06/23/2018  ?  6:25 AM  ?Advanced Directives  ?Does Patient Have a Medical Advance Directive? Yes Yes Yes Yes No Yes Yes  ?Type of Paramedic of [place];Living will  Living will Living will  Living will;Healthcare Power of [place];Living will  ?Does patient want to make changes to medical advance directive?   No - Patient declined No - Patient declined   No - Patient declined  ?Copy of [place] in Chart? No - copy requested       No - copy requested  ?Would patient like information on creating a medical advance directive?   No - Patient declined No - Patient declined No - Patient declined    ? ? ?Current Medications (verified) ?Outpatient Encounter Medications as of 05/15/2021  ?Medication Sig  ? acetaminophen (TYLENOL) 500 MG tablet Take 500 mg by mouth at bedtime.  ? aspirin EC 81 MG tablet Take 81 mg by mouth at bedtime.  ? Azelastine-Fluticasone 137-50 MCG/ACT SUSP Place 1 spray into the nose every 12 (twelve) hours.  ? blood glucose meter kit and supplies KIT Dispense based on  insurance preference. Tests once a day E11.9  ? Carboxymethylcellulose Sodium (THERATEARS) 0.25 % SOLN Place 1 drop into both eyes 2 (two) times daily.  ? cholecalciferol (VITAMIN D3) 25 MCG (1000 UNIT) tablet Take 1,000 Units by mouth daily.  ? clidinium-chlordiazePOXIDE (LIBRAX) 5-2.5 MG capsule Take 1 capsule by mouth 4 (four) times daily as needed (for esophageal symptoms). (Patient taking differently: Take 1 capsule by mouth daily as needed (for esophageal symptoms).)  ? clobetasol (TEMOVATE) 0.05 % external solution 1 application daily as needed (itching).  ? ezetimibe (ZETIA) 10 MG tablet TAKE 1 TABLET BY MOUTH EVERY DAY (Patient taking differently: Take 10 mg by mouth at bedtime.)  ? hyoscyamine (LEVSIN SL) 0.125 MG SL tablet USE 1 TABLET UNDER THE TONGUE BEFORE MEALS AND AT BEDTIME AS NEEDED FOR FLARES IN SYMPTOMS, diarrhea/abdominal cramping (Patient taking differently: Take 0.125 mg  by mouth daily as needed for cramping.)  ? ketoconazole (NIZORAL) 2 % cream Apply 1 application topically daily as needed for irritation (to affected area. external use only). (Patient taking differently: Apply 1 application. topically daily.)  ? losartan (COZAAR) 50 MG tablet Take 1 tablet (50 mg total) by mouth daily.  ? meclizine (ANTIVERT) 25 MG tablet TAKE 1 TABLET BY MOUTH 3 TIMES A DAY AS NEEDED (Patient taking differently: Take 25 mg by mouth daily as needed for  dizziness.)  ? metFORMIN (GLUCOPHAGE) 500 MG tablet TAKE ONE TABLET BY MOUTH EVERY MORNING ONE AT LUNCH AND 2 AT BEDTIME (Patient taking differently: Take 500-1,000 mg by mouth See admin instructions. TAKE ONE TABLET BY MOUTH EVERY MORNING ONE AT LUNCH AND 2 AT BEDTIME)  ? methenamine (HIPREX) 1 g tablet Take 1 tablet (1 g total) by mouth 2 (two) times daily.  ? mirabegron ER (MYRBETRIQ) 50 MG TB24 tablet Take 1 tablet (50 mg total) by mouth daily.  ? Multiple Vitamins-Minerals (PRESERVISION AREDS 2) CAPS Take 1 capsule by mouth 2 (two) times daily.   ? nateglinide (STARLIX) 120 MG tablet TAKE 1 TABLET BY MOUTH 3 TIMES A DAY BEFORE MEALS (Patient taking differently: Take 120 mg by mouth 3 (three) times daily with meals.)  ? omega-3 acid ethyl esters (LOVAZA) 1 g capsule Take 2 g by mouth daily.  ? omeprazole (PRILOSEC) 20 MG capsule TAKE 1 CAPSULE BY MOUTH EVERY DAY  ? Pancrelipase, Lip-Prot-Amyl, (CREON) 24000-76000 units CPEP TAKE 3 CAPSULES THREE TIMES DAILY WITH MEALS AND ONE CAPSULE WITH SNACK TWICE DAILY  ? traMADol (ULTRAM) 50 MG tablet Take 1 tablet (50 mg total) by mouth every 6 (six) hours as needed.  ? Vitamin Mixture (ESTER-C) 500-60 MG TABS Take 1 tablet by mouth daily.   ? [DISCONTINUED] ipratropium (ATROVENT) 0.06 % nasal spray SMARTSIG:1-2 Spray(s) Both Nares Twice Daily PRN  ? ?No facility-administered encounter medications on file as of 05/15/2021.  ? ? ?Allergies (verified) ?Adhesive [tape], Estrogens, and Sulfonamide derivatives  ? ?History: ?Past Medical History:  ?Diagnosis Date  ? Arthritis   ? Benign neoplasm of colon   ? Constipation   ? Diaphragmatic hernia without mention of obstruction or gangrene   ? Diverticulosis of colon (without mention of hemorrhage)   ? Dysphagia, pharyngoesophageal phase   ? Esophageal reflux   ? Essential hypertension   ? Heart murmur   ? Hemorrhoids   ? Hyperlipidemia   ? Internal hemorrhoids without mention of complication   ? Left wrist fracture   ? PONV  (postoperative nausea and vomiting)   ? Stricture and stenosis of esophagus   ? Type 2 diabetes mellitus ([place])   ? Unspecified gastritis and gastroduodenitis without mention of hemorrhage   ? ?Past Surgical History:  ?Procedure Laterality Date  ? ABDOMINAL HYSTERECTOMY    ? BACTERIAL OVERGROWTH TEST N/A 09/06/2015  ? Procedure: BACTERIAL OVERGROWTH TEST;  Surgeon: Daneil Dolin, MD;  Location: [facility];  Service: Endoscopy;  Laterality: N/A;  800  ? BREAST LUMPECTOMY Right   ? benign  ? CATARACT EXTRACTION Bilateral 2006  ? Implants in both, surgeries done 6 weeks apart  ? CHOLECYSTECTOMY    ? COLONOSCOPY  03/08/02  ? [place]: diverticulosis, internal hemorrhoids, adenomatous colon polyp  ? COLONOSCOPY  01/23/04  ? [place]: diverticulosis, internal hemorrhoids  ? COLONOSCOPY  09/25/05  ? [place]: diverticulosis  ? COLONOSCOPY  07/05/09  ? [place]: severe diverticulosis  ? COLONOSCOPY  01/21/11  ? [name]: severe  diverticulosis in sigmoid to desc colon, int hemorrhoids, follow up TCS in 5 years  ? COLONOSCOPY N/A 04/10/2016  ? [name]: diverticulosis  ? CYSTOSCOPY W/ URETERAL STENT PLACEMENT Right 03/15/2021  ? Procedure: CYSTOSCOPY WITH RETROGRADE PYELOGRAM/URETERAL STENT PLACEMENT;  Surgeon: Janith Lima, MD;  Location: [facility];  Service: Urology;  Laterality: Right;  ? CYSTOSCOPY WITH RETROGRADE PYELOGRAM, URETEROSCOPY AND STENT PLACEMENT Right 03/29/2021  ? Procedure: CYSTOSCOPY WITH RETROGRADE PYELOGRAM, URETEROSCOPY AND STENT EXCHANGE;  Surgeon: Cleon Gustin, MD;  Location: [facility];  Service: Urology;  Laterality: Right;  ? DILATION AND CURETTAGE OF UTERUS    ? ESOPHAGEAL MANOMETRY  03/21/08  ? [place]: findings c/w [place] Esophagus  ? ESOPHAGOGASTRODUODENOSCOPY  03/08/02  ? [place]: esophageal stricture, chronic gerd, s/p dilation.   ? ESOPHAGOGASTRODUODENOSCOPY  01/23/04  ? [place]: esophageal stricture, gastritis, hiatal hernia  ? ESOPHAGOGASTRODUODENOSCOPY  09/25/05  ? [place]: gastritis,  benign bx, no H.pylori  ? ESOPHAGOGASTRODUODENOSCOPY  07/05/09  ? [place]: gastropathy, benign small bowel bx and gastric bx  ? ESOPHAGOGASTRODUODENOSCOPY N/A 05/15/2015  ? Dr. Gala Romney- diffuse moderate inflammat

## 2021-05-15 NOTE — Patient Instructions (Signed)
Ms. Gates , ?Thank you for taking time to come for your Medicare Wellness Visit. I appreciate your ongoing commitment to your health goals. Please review the following plan we discussed and let me know if I can assist you in the future.  ? ?Screening recommendations/referrals: ?Colonoscopy: No longer required due to age.  ?Mammogram: No longer required due to age. ?Bone Density: Done 01/08/2016 Repeat every 2 years ? ?Recommended yearly ophthalmology/optometry visit for glaucoma screening and checkup ?Recommended yearly dental visit for hygiene and checkup ? ?Vaccinations: ?Influenza vaccine: Done 12/07/20 Repeat annually ? ?Pneumococcal vaccine: Done 11/22/2013 and 12/08/2003 ?Tdap vaccine: Done 07/21/2000 Repeat in 10 years ? ?Shingles vaccine: Done 08/10/2019 and 07/07/2019   ?Covid-19:Done 04/01/2019 and 04/30/2019. ? ?Advanced directives: Please bring a copy of your health care power of attorney and living will to the office to be added to your chart at your convenience. ? ? ?Conditions/risks identified: KEEP UP THE GOOD WORK!! ? ?Next appointment: Follow up in one year for your annual wellness visit 2024. ? ? ?Preventive Care 85 Years and Older, Female ?Preventive care refers to lifestyle choices and visits with your health care provider that can promote health and wellness. ?What does preventive care include? ?A yearly physical exam. This is also called an annual well check. ?Dental exams once or twice a year. ?Routine eye exams. Ask your health care provider how often you should have your eyes checked. ?Personal lifestyle choices, including: ?Daily care of your teeth and gums. ?Regular physical activity. ?Eating a healthy diet. ?Avoiding tobacco and drug use. ?Limiting alcohol use. ?Practicing safe sex. ?Taking low-dose aspirin every day. ?Taking vitamin and mineral supplements as recommended by your health care provider. ?What happens during an annual well check? ?The services and screenings done by your health  care provider during your annual well check will depend on your age, overall health, lifestyle risk factors, and family history of disease. ?Counseling  ?Your health care provider may ask you questions about your: ?Alcohol use. ?Tobacco use. ?Drug use. ?Emotional well-being. ?Home and relationship well-being. ?Sexual activity. ?Eating habits. ?History of falls. ?Memory and ability to understand (cognition). ?Work and work Statistician. ?Reproductive health. ?Screening  ?You may have the following tests or measurements: ?Height, weight, and BMI. ?Blood pressure. ?Lipid and cholesterol levels. These may be checked every 5 years, or more frequently if you are over 61 years old. ?Skin check. ?Lung cancer screening. You may have this screening every year starting at age 14 if you have a 30-pack-year history of smoking and currently smoke or have quit within the past 15 years. ?Fecal occult blood test (FOBT) of the stool. You may have this test every year starting at age 51. ?Flexible sigmoidoscopy or colonoscopy. You may have a sigmoidoscopy every 5 years or a colonoscopy every 10 years starting at age 66. ?Hepatitis C blood test. ?Hepatitis B blood test. ?Sexually transmitted disease (STD) testing. ?Diabetes screening. This is done by checking your blood sugar (glucose) after you have not eaten for a while (fasting). You may have this done every 1-3 years. ?Bone density scan. This is done to screen for osteoporosis. You may have this done starting at age 33. ?Mammogram. This may be done every 1-2 years. Talk to your health care provider about how often you should have regular mammograms. ?Talk with your health care provider about your test results, treatment options, and if necessary, the need for more tests. ?Vaccines  ?Your health care provider may recommend certain vaccines, such as: ?Influenza vaccine.  This is recommended every year. ?Tetanus, diphtheria, and acellular pertussis (Tdap, Td) vaccine. You may need a Td  booster every 10 years. ?Zoster vaccine. You may need this after age 40. ?Pneumococcal 13-valent conjugate (PCV13) vaccine. One dose is recommended after age 51. ?Pneumococcal polysaccharide (PPSV23) vaccine. One dose is recommended after age 28. ?Talk to your health care provider about which screenings and vaccines you need and how often you need them. ?This information is not intended to replace advice given to you by your health care provider. Make sure you discuss any questions you have with your health care provider. ?Document Released: 03/03/2015 Document Revised: 10/25/2015 Document Reviewed: 12/06/2014 ?Elsevier Interactive Patient Education ? 2017 [place]. ? ?Fall Prevention in the Home ?Falls can cause injuries. They can happen to people of all ages. There are many things you can do to make your home safe and to help prevent falls. ?What can I do on the outside of my home? ?Regularly fix the edges of walkways and driveways and fix any cracks. ?Remove anything that might make you trip as you walk through a door, such as a raised step or threshold. ?Trim any bushes or trees on the path to your home. ?Use bright outdoor lighting. ?Clear any walking paths of anything that might make someone trip, such as rocks or tools. ?Regularly check to see if handrails are loose or broken. Make sure that both sides of any steps have handrails. ?Any raised decks and porches should have guardrails on the edges. ?Have any leaves, snow, or ice cleared regularly. ?Use sand or salt on walking paths during winter. ?Clean up any spills in your garage right away. This includes oil or grease spills. ?What can I do in the bathroom? ?Use night lights. ?Install grab bars by the toilet and in the tub and shower. Do not use towel bars as grab bars. ?Use non-skid mats or decals in the tub or shower. ?If you need to sit down in the shower, use a plastic, non-slip stool. ?Keep the floor dry. Clean up any water that spills on the floor  as soon as it happens. ?Remove soap buildup in the tub or shower regularly. ?Attach bath mats securely with double-sided non-slip rug tape. ?Do not have throw rugs and other things on the floor that can make you trip. ?What can I do in the bedroom? ?Use night lights. ?Make sure that you have a light by your bed that is easy to reach. ?Do not use any sheets or blankets that are too big for your bed. They should not hang down onto the floor. ?Have a firm chair that has side arms. You can use this for support while you get dressed. ?Do not have throw rugs and other things on the floor that can make you trip. ?What can I do in the kitchen? ?Clean up any spills right away. ?Avoid walking on wet floors. ?Keep items that you use a lot in easy-to-reach places. ?If you need to reach something above you, use a strong step stool that has a grab bar. ?Keep electrical cords out of the way. ?Do not use floor polish or wax that makes floors slippery. If you must use wax, use non-skid floor wax. ?Do not have throw rugs and other things on the floor that can make you trip. ?What can I do with my stairs? ?Do not leave any items on the stairs. ?Make sure that there are handrails on both sides of the stairs and use them. Fix handrails  that are broken or loose. Make sure that handrails are as long as the stairways. ?Check any carpeting to make sure that it is firmly attached to the stairs. Fix any carpet that is loose or worn. ?Avoid having throw rugs at the top or bottom of the stairs. If you do have throw rugs, attach them to the floor with carpet tape. ?Make sure that you have a light switch at the top of the stairs and the bottom of the stairs. If you do not have them, ask someone to add them for you. ?What else can I do to help prevent falls? ?Wear shoes that: ?Do not have high heels. ?Have rubber bottoms. ?Are comfortable and fit you well. ?Are closed at the toe. Do not wear sandals. ?If you use a stepladder: ?Make sure that it is  fully opened. Do not climb a closed stepladder. ?Make sure that both sides of the stepladder are locked into place. ?Ask someone to hold it for you, if possible. ?Clearly mark and make sure that you

## 2021-05-17 ENCOUNTER — Ambulatory Visit: Payer: Medicare Other | Admitting: Physician Assistant

## 2021-05-22 DIAGNOSIS — B351 Tinea unguium: Secondary | ICD-10-CM | POA: Diagnosis not present

## 2021-05-22 DIAGNOSIS — E1142 Type 2 diabetes mellitus with diabetic polyneuropathy: Secondary | ICD-10-CM | POA: Diagnosis not present

## 2021-05-25 ENCOUNTER — Other Ambulatory Visit: Payer: Self-pay | Admitting: Family Medicine

## 2021-05-28 IMAGING — MG DIGITAL SCREENING BILAT W/ TOMO W/ CAD
6 of 10 series · 6 of 30 positions shown · non-contrast
Comparison: Previous exam(s).

CLINICAL DATA: Screening.

EXAM:
DIGITAL SCREENING BILATERAL MAMMOGRAM WITH TOMO AND CAD

[L MLO synth-2D]
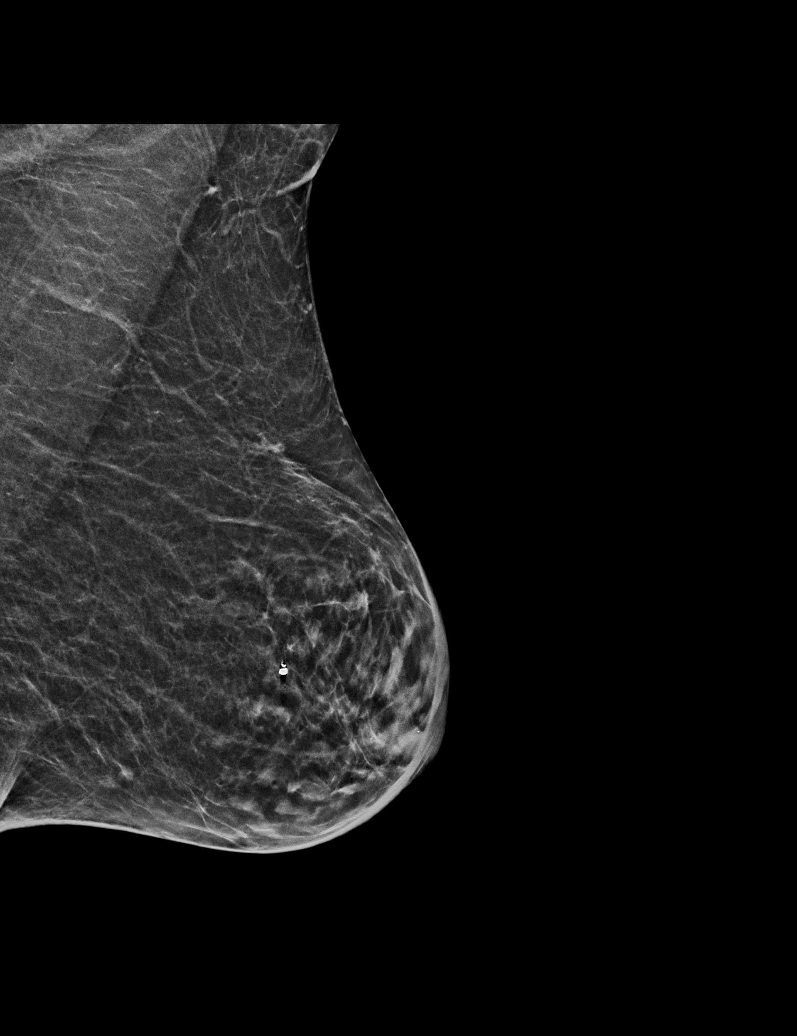

[R MLO synth-2D (1 of 2)]
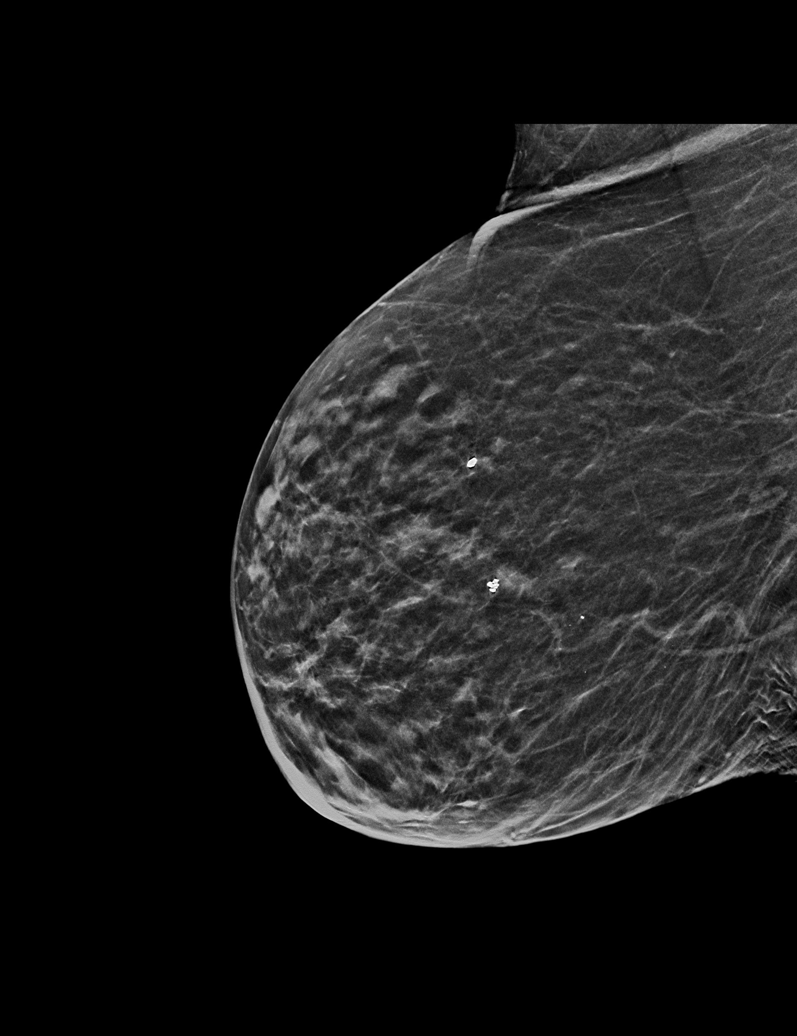

[R CC synth-2D]
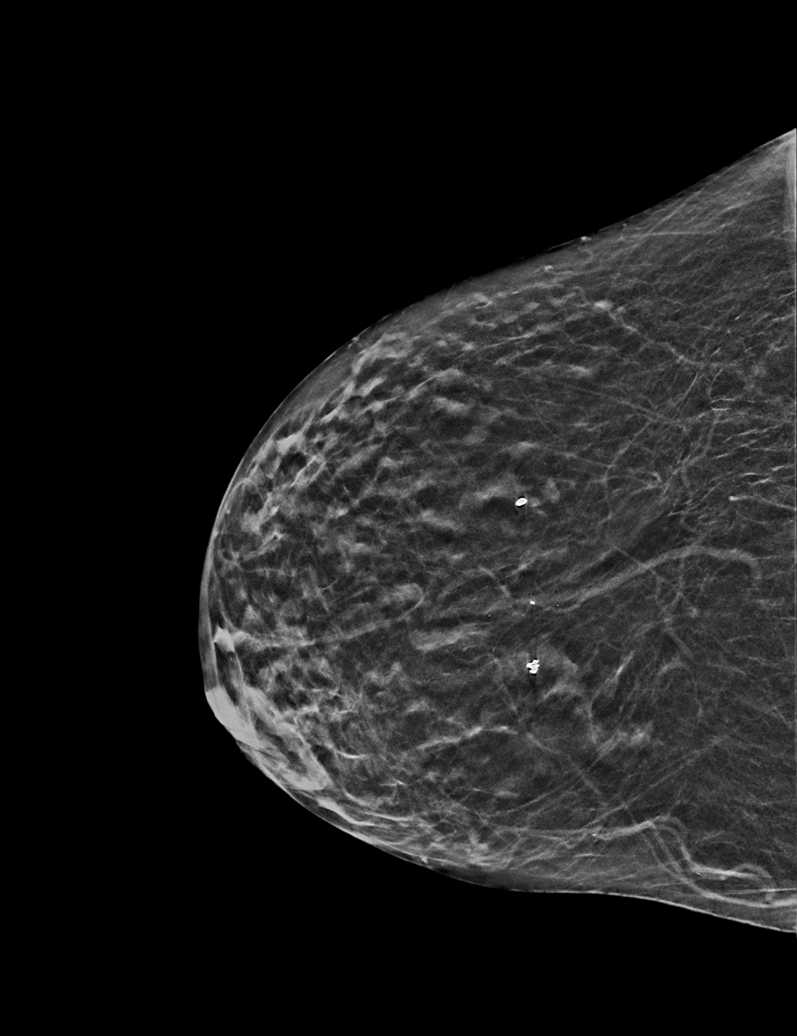

[L CC synth-2D]
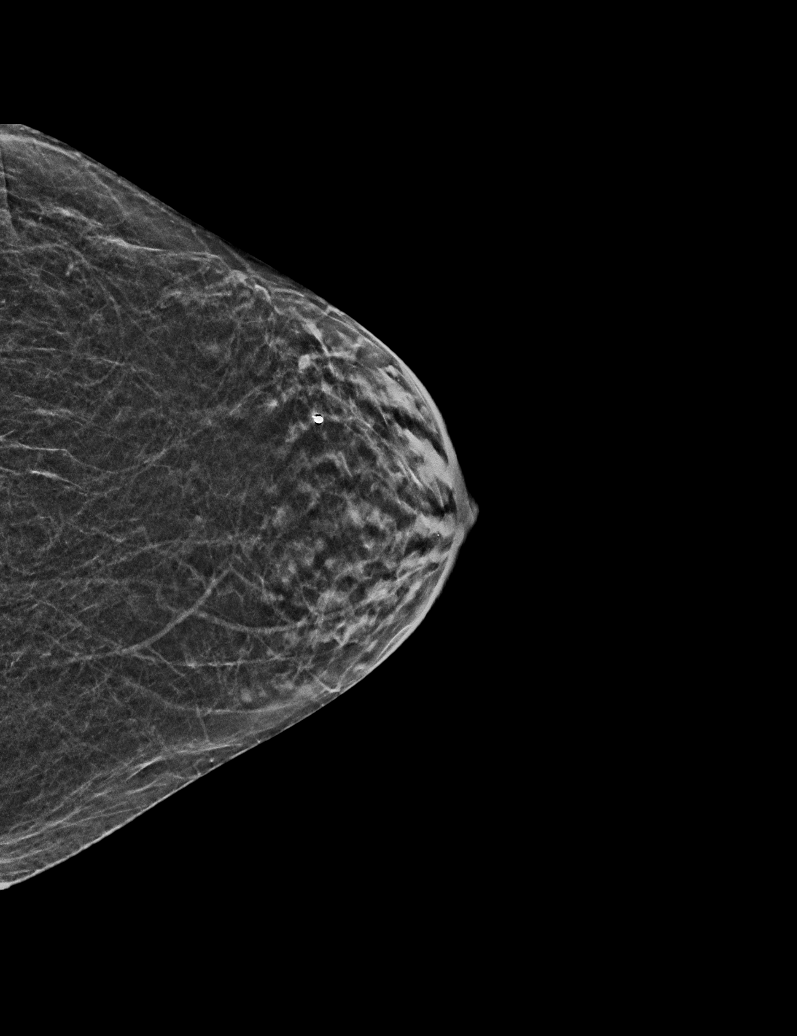

[R MLO synth-2D (2 of 2)]
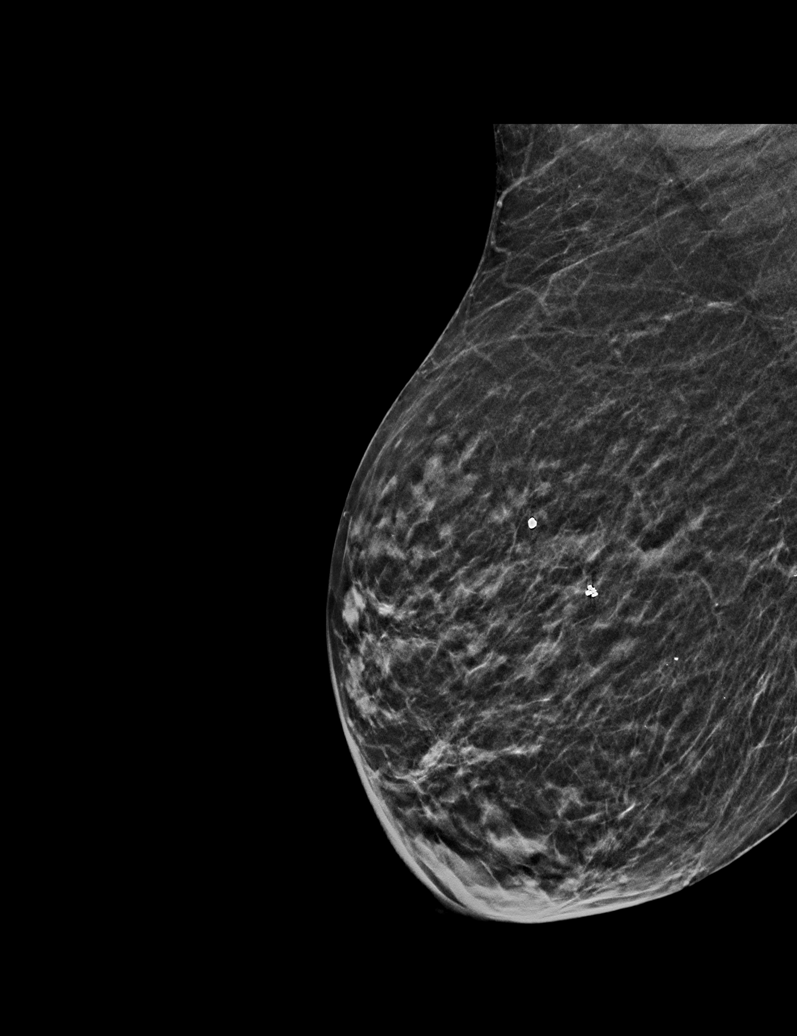

[R MLO tomo · tomo slice 19/38.0]
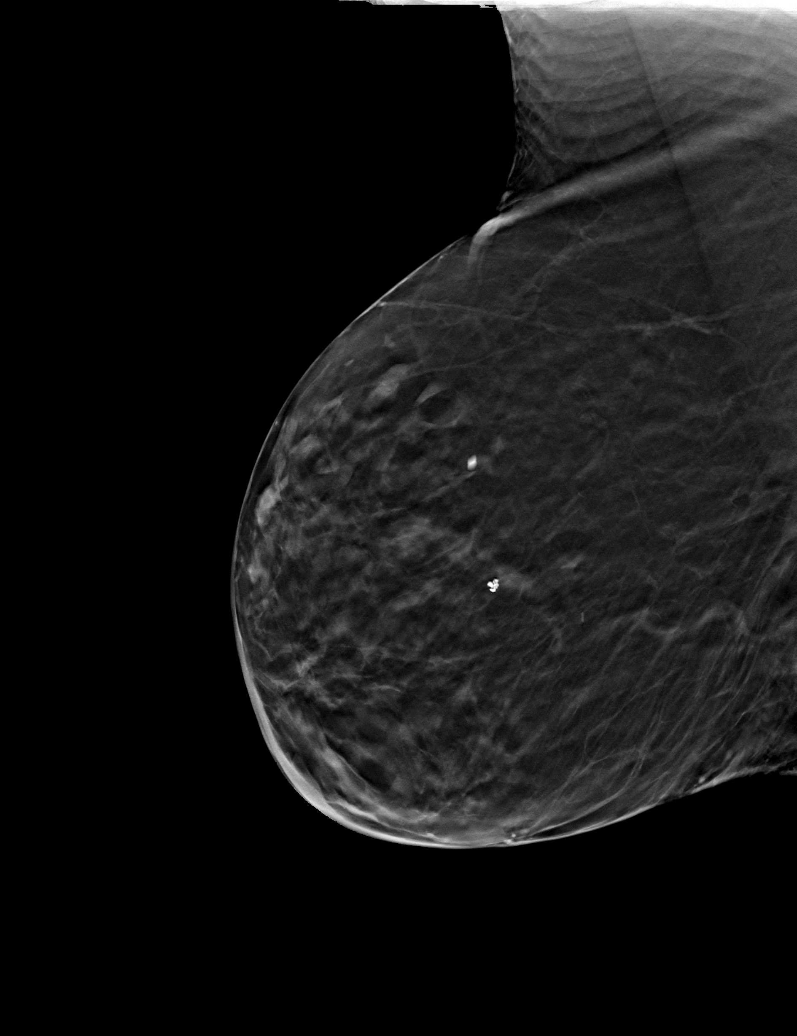

[6 of 30 positions shown; findings below may reference images not displayed]

ACR Breast Density Category b: There are scattered areas of
fibroglandular density.
FINDINGS: There are no findings suspicious for malignancy. Images were
processed with CAD.
IMPRESSION: No mammographic evidence of malignancy. A result letter of this
screening mammogram will be mailed directly to the patient.

RECOMMENDATION:
Screening mammogram in one year. (Code:[id number])

BI-RADS CATEGORY  1: Negative.

## 2021-05-30 ENCOUNTER — Telehealth: Payer: Self-pay

## 2021-05-30 ENCOUNTER — Telehealth: Payer: Self-pay | Admitting: Internal Medicine

## 2021-05-30 ENCOUNTER — Ambulatory Visit (INDEPENDENT_AMBULATORY_CARE_PROVIDER_SITE_OTHER): Payer: Medicare Other | Admitting: Physician Assistant

## 2021-05-30 ENCOUNTER — Other Ambulatory Visit: Payer: Self-pay

## 2021-05-30 VITALS — BP 149/73 | HR 93 | Ht 64.0 in | Wt 133.0 lb

## 2021-05-30 DIAGNOSIS — A09 Infectious gastroenteritis and colitis, unspecified: Secondary | ICD-10-CM

## 2021-05-30 DIAGNOSIS — R197 Diarrhea, unspecified: Secondary | ICD-10-CM

## 2021-05-30 DIAGNOSIS — R8271 Bacteriuria: Secondary | ICD-10-CM

## 2021-05-30 DIAGNOSIS — R300 Dysuria: Secondary | ICD-10-CM | POA: Diagnosis not present

## 2021-05-30 DIAGNOSIS — R350 Frequency of micturition: Secondary | ICD-10-CM

## 2021-05-30 LAB — URINALYSIS, ROUTINE W REFLEX MICROSCOPIC
Bilirubin, UA: NEGATIVE
Glucose, UA: NEGATIVE
Ketones, UA: NEGATIVE
Nitrite, UA: NEGATIVE
Protein,UA: NEGATIVE
RBC, UA: NEGATIVE
Specific Gravity, UA: 1.015 (ref 1.005–1.030)
Urobilinogen, Ur: 0.2 mg/dL (ref 0.2–1.0)
pH, UA: 6.5 (ref 5.0–7.5)

## 2021-05-30 LAB — MICROSCOPIC EXAMINATION
RBC, Urine: NONE SEEN /hpf (ref 0–2)
Renal Epithel, UA: NONE SEEN /hpf

## 2021-05-30 MED ORDER — NITROFURANTOIN MONOHYD MACRO 100 MG PO CAPS: 100.0000 mg | ORAL_CAPSULE | Freq: Two times a day (BID) | ORAL | 0 refills | Status: AC

## 2021-05-30 NOTE — Progress Notes (Deleted)
? ?Assessment: ?1. Urine frequency   ? ? ?Plan: ?*** ? ?Chief Complaint: ?No chief complaint on file. ? ? ?HPI: ?Deborah Gray is a 88 y.o. female who presents for continued evaluation of ***. ? ? ?Portions of the above documentation were copied from a prior visit for review purposes only. ? ?Allergies: ?Allergies  ?Allergen Reactions  ? Adhesive [Tape] Other (See Comments)  ?  Reaction:  Blisters   ? Estrogens Other (See Comments)  ?  Reaction:  Migraines   ? Sulfonamide Derivatives Rash  ? ? ?PMH: ?Past Medical History:  ?Diagnosis Date  ? Arthritis   ? Benign neoplasm of colon   ? Constipation   ? Diaphragmatic hernia without mention of obstruction or gangrene   ? Diverticulosis of colon (without mention of hemorrhage)   ? Dysphagia, pharyngoesophageal phase   ? Esophageal reflux   ? Essential hypertension   ? Heart murmur   ? Hemorrhoids   ? Hyperlipidemia   ? Internal hemorrhoids without mention of complication   ? Left wrist fracture   ? PONV (postoperative nausea and vomiting)   ? Stricture and stenosis of esophagus   ? Type 2 diabetes mellitus ([place])   ? Unspecified gastritis and gastroduodenitis without mention of hemorrhage   ? ? ?PSH: ?Past Surgical History:  ?Procedure Laterality Date  ? ABDOMINAL HYSTERECTOMY    ? BACTERIAL OVERGROWTH TEST N/A 09/06/2015  ? Procedure: BACTERIAL OVERGROWTH TEST;  Surgeon: Daneil Dolin, MD;  [facility];  Service: Endoscopy;  Laterality: N/A;  800  ? BREAST LUMPECTOMY Right   ? benign  ? CATARACT EXTRACTION Bilateral 2006  ? Implants in both, surgeries done 6 weeks apart  ? CHOLECYSTECTOMY    ? COLONOSCOPY  03/08/02  ? [place]: diverticulosis, internal hemorrhoids, adenomatous colon polyp  ? COLONOSCOPY  01/23/04  ? [place]: diverticulosis, internal hemorrhoids  ? COLONOSCOPY  09/25/05  ? [place]: diverticulosis  ? COLONOSCOPY  07/05/09  ? [place]: severe diverticulosis  ? COLONOSCOPY  01/21/11  ? [place]: severe diverticulosis in sigmoid to desc  colon, int hemorrhoids, follow up TCS in 5 years  ? COLONOSCOPY N/A 04/10/2016  ? [name]: diverticulosis  ? CYSTOSCOPY W/ URETERAL STENT PLACEMENT Right 03/15/2021  ? Procedure: CYSTOSCOPY WITH RETROGRADE PYELOGRAM/URETERAL STENT PLACEMENT;  Surgeon: Janith Lima, MD;  [facility];  Service: Urology;  Laterality: Right;  ? CYSTOSCOPY WITH RETROGRADE PYELOGRAM, URETEROSCOPY AND STENT PLACEMENT Right 03/29/2021  ? Procedure: CYSTOSCOPY WITH RETROGRADE PYELOGRAM, URETEROSCOPY AND STENT EXCHANGE;  Surgeon: Cleon Gustin, MD;  [facility];  Service: Urology;  Laterality: Right;  ? DILATION AND CURETTAGE OF UTERUS    ? ESOPHAGEAL MANOMETRY  03/21/08  ? Patterson: findings c/w Nutcracker Esophagus  ? ESOPHAGOGASTRODUODENOSCOPY  03/08/02  ? [place]: esophageal stricture, chronic gerd, s/p dilation.   ? ESOPHAGOGASTRODUODENOSCOPY  01/23/04  ? [place]: esophageal stricture, gastritis, hiatal hernia  ? ESOPHAGOGASTRODUODENOSCOPY  09/25/05  ? [place]: gastritis, benign bx, no H.pylori  ? ESOPHAGOGASTRODUODENOSCOPY  07/05/09  ? [place]: gastropathy, benign small bowel bx and gastric bx  ? ESOPHAGOGASTRODUODENOSCOPY N/A 05/15/2015  ? Dr. Gala Romney- diffuse moderate inflammation characterized by congestion (edema), erythema, and linear erosions was found in the entire examined stomach. bx= reactive gastropathy  ? FOOT SURGERY Left   ? HEMORRHOID BANDING  2017  ? Dr.[name]  ? HOLMIUM LASER APPLICATION Right 06/23/2561  ? Procedure: HOLMIUM LASER APPLICATION;  Surgeon: Cleon Gustin, MD;  [facility];  Service: Urology;  Laterality: Right;  ?  LAPAROSCOPIC VAGINAL HYSTERECTOMY    ? ORIF WRIST FRACTURE Left 06/23/2018  ? Procedure: OPEN REDUCTION INTERNAL FIXATION (ORIF) LEFT WRIST FRACTURE;  Surgeon: Renette Butters, MD;  Location: [facility];  Service: Orthopedics;  Laterality: Left;  regional arm block  ? RECTOCELE REPAIR    ? TOTAL KNEE ARTHROPLASTY Right   ? ? ?SH: ?Social History  ? ?Tobacco Use   ? Smoking status: Never  ? Smokeless tobacco: Never  ? Tobacco comments:  ?  Never smoked  ?Vaping Use  ? Vaping Use: Never used  ?Substance Use Topics  ? Alcohol use: No  ?  Alcohol/week: 0.0 standard drinks  ? Drug use: No  ? ? ?ROS: ?Constitutional:  Negative for fever, chills, weight loss ?CV: Negative for chest pain ?Respiratory:  Negative for shortness of breath, wheezing, sleep apnea, frequent cough ?GI:  Negative for nausea, vomiting, bloody stool, GERD ? ?PE: ?BP (!) 149/73   Pulse 93   Ht '5\' 4"'$  (1.626 m)   Wt 133 lb (60.3 kg)   BMI 22.83 kg/m?  ?GENERAL APPEARANCE:  Well appearing, well developed, well nourished, NAD ?HEENT:  Atraumatic, normocephalic ?NECK:  Supple. Trachea midline ?ABDOMEN:  Soft, non-tender, no masses ?EXTREMITIES:  Moves all extremities well, without clubbing, cyanosis, or edema ?NEUROLOGIC:  Alert and oriented x 3, normal gait, CN II-XII grossly intact ?MENTAL STATUS:  appropriate ?BACK:  Non-tender to palpation, No CVAT ?SKIN:  Warm, dry, and intact ? ? ?Results: ?Laboratory Data: ?Lab Results  ?Component Value Date  ? WBC 8.0 05/07/2021  ? HGB 12.9 05/07/2021  ? HCT 38.9 05/07/2021  ? MCV 87 05/07/2021  ? PLT 247 05/07/2021  ? ? ?Lab Results  ?Component Value Date  ? CREATININE 0.67 05/07/2021  ? ? ?No results found for: PSA ? ?No results found for: TESTOSTERONE ? ?Lab Results  ?Component Value Date  ? HGBA1C 6.1 (H) 03/15/2021  ? ? ?Urinalysis ?   ?Component Value Date/Time  ? [place] 03/15/2021 1740  ? APPEARANCEUR Clear 04/30/2021 1449  ? LABSPEC 1.015 03/15/2021 1740  ? PHURINE 5.0 03/15/2021 1740  ? GLUCOSEU Negative 04/30/2021 1449  ? [place] NEGATIVE 03/15/2021 1740  ? BILIRUBINUR Negative 04/30/2021 1449  ? [place] NEGATIVE 03/15/2021 1740  ? PROTEINUR Negative 04/30/2021 1449  ? PROTEINUR 30 (A) 03/15/2021 1740  ? UROBILINOGEN 0.2 08/18/2019 1326  ? UROBILINOGEN 0.2 04/08/2008 1454  ? NITRITE Negative 04/30/2021 1449  ? NITRITE NEGATIVE 03/15/2021 1740  ?  LEUKOCYTESUR Negative 04/30/2021 1449  ? LEUKOCYTESUR LARGE (A) 03/15/2021 1740  ? ? ?Lab Results  ?Component Value Date  ? LABMICR Comment 04/30/2021  ? WBCUA 6-10 (A) 04/25/2021  ? LABEPIT 0-10 04/25/2021  ? MUCUS Present 03/23/2021  ? BACTERIA Few 04/25/2021  ? ? ?Pertinent Imaging: ?*** ?No results found for this or any previous visit. ? ?No results found for this or any previous visit. ? ?No results found for this or any previous visit. ? ?No results found for this or any previous visit. ? ?Results for orders placed during the hospital encounter of 04/11/21 ? ?Ultrasound renal complete ? ?Narrative ?CLINICAL DATA:  Nephrolithiasis.  Recent urologic procedures. ? ?EXAM: ?RENAL / URINARY TRACT ULTRASOUND COMPLETE ? ?COMPARISON:  CT [facility], 03/15/2021. US Renal, 10/28/2008. Intraoperative ?fluoroscopy, 03/16/2021. ? ?FINDINGS: ?Suboptimal evaluation, secondary to shadowing from overlying bowel ?gas. ? ?Right Kidney: ? ?Renal measurements: 12.4 x 4.7 x 5.4 cm = volume: 164 mL. Mild ?diffuse renal cortical thinning. Echogenicity within normal limits. ?Mild pelvicaliectasis.  No hydronephrosis. ? ?Exophytic renal cortical cyst measuring up to 3.7 cm ? ?Left Kidney: ? ?Renal measurements: 10.4 x 6.5 x 5.2 cm = volume: 183 mL. ?Echogenicity within normal limits. No hydronephrosis. ? ?Exophytic renal cysts, measuring up to 3.3 and 2.5 cm. ? ?Bladder: ? ?Appears normal for degree of bladder distention. Urinary jets are ?visualized. Postvoid imaging was not obtained. ? ?Other: ? ?None. ? ?IMPRESSION: ?1. Mild RIGHT pelvicaliectasis, without discrete hydronephrosis, ?after ureteral stent placement. ?2. Senescent renal changes with mild RIGHT cortical thinning and ?bilateral renal cysts, as above. ? ? ?Electronically Signed ?By: Michaelle Birks M.D. ?On: 04/13/2021 11:51 ? ?No results found for this or any previous visit. ? ?No results found for this or any previous visit. ? ?No results found for this or any previous visit. ? ?No  results found for this or any previous visit (from the past 24 hour(s)).  ?

## 2021-05-30 NOTE — Telephone Encounter (Signed)
Pt needs to speak to nurse about her diarrhea and wanting to do a stool test. She is aware of her OV on 4/27. [phone] ?

## 2021-05-30 NOTE — Telephone Encounter (Signed)
Spoke with the pt and she is taking her Creon and Levsin. Her diarrhea is worse she states that's why she wanted orders for stool test and was there anything else she can take Rx strength for her diarrhea. ?

## 2021-05-30 NOTE — Telephone Encounter (Signed)
Per chart review, I see she has chronic intermittent diarrhea that is thought to be multifactorial from IBS and/or pancreatic exocrine insufficiency. Is she taking Creon as prescribed and Levsin as needed? Is the diarrhea worse than her baseline? If so, it would be reasonable to check her stool for c diff as she was on Augmentin in February. ?

## 2021-05-30 NOTE — Telephone Encounter (Signed)
Noted  

## 2021-05-30 NOTE — Telephone Encounter (Signed)
Pt will come by the office today to pick up her lab forms to carry with her to the lab and (she was informed not to take anything else for her diarrhea) until we have ruled out infection. The pt expressed understanding  ?

## 2021-05-30 NOTE — Telephone Encounter (Signed)
I spoke with the pt and she is having random days of having diarrhea up to 6 times a day. On the days when its not harsh it  is BM's 2 a day or her bigger days like Sunday past 6 times. She has taken Imodium this morning because she has a appt @ 2:30. Please advise the pt wants to have a stool test done ?

## 2021-05-30 NOTE — Progress Notes (Signed)
? ?Assessment: ?1. Urine frequency   ?2. Dysuria   ?3. Bacteriuria   ? ? ?Plan: ?Based on previous cultures and patient's tolerance to medications, Macrobid for 7 days prescribed.  Urine sent for culture.  Will adjust therapy pending culture results if indicated.  She will keep her scheduled follow-up with Dr. Alyson Ingles. ? ?Chief Complaint: ?No chief complaint on file. ? ? ?HPI: ?Deborah Gray is a 88 y.o. female who presents for continued evaluation of 1 week history of mild dysuria and increasing urinary frequency.  No gross hematuria, fever, nausea.  She has had significant bouts of diarrhea over the past few weeks which is being worked up with her primary care provider.  She feels her diarrhea has contributed to possibly causing another UTI.  She is otherwise continues to do well and is scheduled for follow-up treatments for her IC.Marland Kitchen ? ?UA = 11-30 WBCs no RBCs few bacteria nitrite negative ? ?Portions of the above documentation were copied from a prior visit for review purposes only. ? ?Allergies: ?Allergies  ?Allergen Reactions  ? Adhesive [Tape] Other (See Comments)  ?  Reaction:  Blisters   ? Estrogens Other (See Comments)  ?  Reaction:  Migraines   ? Sulfonamide Derivatives Rash  ? ? ?PMH: ?Past Medical History:  ?Diagnosis Date  ? Arthritis   ? Benign neoplasm of colon   ? Constipation   ? Diaphragmatic hernia without mention of obstruction or gangrene   ? Diverticulosis of colon (without mention of hemorrhage)   ? Dysphagia, pharyngoesophageal phase   ? Esophageal reflux   ? Essential hypertension   ? Heart murmur   ? Hemorrhoids   ? Hyperlipidemia   ? Internal hemorrhoids without mention of complication   ? Left wrist fracture   ? PONV (postoperative nausea and vomiting)   ? Stricture and stenosis of esophagus   ? Type 2 diabetes mellitus ([place])   ? Unspecified gastritis and gastroduodenitis without mention of hemorrhage   ? ? ?PSH: ?Past Surgical History:  ?Procedure Laterality Date  ? ABDOMINAL  HYSTERECTOMY    ? BACTERIAL OVERGROWTH TEST N/A 09/06/2015  ? Procedure: BACTERIAL OVERGROWTH TEST;  Surgeon: Daneil Dolin, MD;  [facility];  Service: Endoscopy;  Laterality: N/A;  800  ? BREAST LUMPECTOMY Right   ? benign  ? CATARACT EXTRACTION Bilateral 2006  ? Implants in both, surgeries done 6 weeks apart  ? CHOLECYSTECTOMY    ? COLONOSCOPY  03/08/02  ? [place]: diverticulosis, internal hemorrhoids, adenomatous colon polyp  ? COLONOSCOPY  01/23/04  ? Patterson: diverticulosis, internal hemorrhoids  ? COLONOSCOPY  09/25/05  ? Patterson: diverticulosis  ? COLONOSCOPY  07/05/09  ? Patterson: severe diverticulosis  ? COLONOSCOPY  01/21/11  ? Patterson: severe diverticulosis in sigmoid to desc colon, int hemorrhoids, follow up TCS in 5 years  ? COLONOSCOPY N/A 04/10/2016  ? [name]: diverticulosis  ? CYSTOSCOPY W/ URETERAL STENT PLACEMENT Right 03/15/2021  ? Procedure: CYSTOSCOPY WITH RETROGRADE PYELOGRAM/URETERAL STENT PLACEMENT;  Surgeon: Janith Lima, MD;  [facility];  Service: Urology;  Laterality: Right;  ? CYSTOSCOPY WITH RETROGRADE PYELOGRAM, URETEROSCOPY AND STENT PLACEMENT Right 03/29/2021  ? Procedure: CYSTOSCOPY WITH RETROGRADE PYELOGRAM, URETEROSCOPY AND STENT EXCHANGE;  Surgeon: Cleon Gustin, MD;  [facility];  Service: Urology;  Laterality: Right;  ? DILATION AND CURETTAGE OF UTERUS    ? ESOPHAGEAL MANOMETRY  03/21/08  ? Patterson: findings c/w [place]acker Esophagus  ? ESOPHAGOGASTRODUODENOSCOPY  03/08/02  ? Patterson: esophageal stricture,  chronic gerd, s/p dilation.   ? ESOPHAGOGASTRODUODENOSCOPY  01/23/04  ? Patterson: esophageal stricture, gastritis, hiatal hernia  ? ESOPHAGOGASTRODUODENOSCOPY  09/25/05  ? Patterson: gastritis, benign bx, no H.pylori  ? ESOPHAGOGASTRODUODENOSCOPY  07/05/09  ? Patterson: gastropathy, benign small bowel bx and gastric bx  ? ESOPHAGOGASTRODUODENOSCOPY N/A 05/15/2015  ? Dr. Gala Romney- diffuse moderate inflammation characterized by congestion (edema),  erythema, and linear erosions was found in the entire examined stomach. bx= reactive gastropathy  ? FOOT SURGERY Left   ? HEMORRHOID BANDING  2017  ? Dr.[name]  ? HOLMIUM LASER APPLICATION Right 0/02/6008  ? Procedure: HOLMIUM LASER APPLICATION;  Surgeon: Cleon Gustin, MD;  [facility];  Service: Urology;  Laterality: Right;  ? LAPAROSCOPIC VAGINAL HYSTERECTOMY    ? ORIF WRIST FRACTURE Left 06/23/2018  ? Procedure: OPEN REDUCTION INTERNAL FIXATION (ORIF) LEFT WRIST FRACTURE;  Surgeon: Renette Butters, MD;  Location: [place] [facility];  Service: Orthopedics;  Laterality: Left;  regional arm block  ? RECTOCELE REPAIR    ? TOTAL KNEE ARTHROPLASTY Right   ? ? ?SH: ?Social History  ? ?Tobacco Use  ? Smoking status: Never  ? Smokeless tobacco: Never  ? Tobacco comments:  ?  Never smoked  ?Vaping Use  ? Vaping Use: Never used  ?Substance Use Topics  ? Alcohol use: No  ?  Alcohol/week: 0.0 standard drinks  ? Drug use: No  ? ? ?ROS: ?Constitutional:  Negative for fever, chills, weight loss ?CV: Negative for chest pain ?Respiratory:  Negative for shortness of breath, wheezing, sleep apnea, frequent cough ?GI:  Negative for nausea, vomiting, bloody stool, GERD ? ?PE: ?BP (!) 149/73   Pulse 93   Ht '5\' 4"'$  (1.626 m)   Wt 133 lb (60.3 kg)   BMI 22.83 kg/m?  ?GENERAL APPEARANCE:  Well appearing, well developed, well nourished, NAD ?HEENT:  Atraumatic, normocephalic ?NECK:  Supple. Trachea midline ?ABDOMEN:  Soft, non-tender, no masses ?NEUROLOGIC:  Alert and oriented x 3, slow, but independent gait ?MENTAL STATUS:  appropriate ?BACK:  No CVAT, tender right SI joint noted ?SKIN:  Warm, dry, and intact ? ? ?Results: ?Laboratory Data: ?Lab Results  ?Component Value Date  ? WBC 8.0 05/07/2021  ? HGB 12.9 05/07/2021  ? HCT 38.9 05/07/2021  ? MCV 87 05/07/2021  ? PLT 247 05/07/2021  ? ? ?Lab Results  ?Component Value Date  ? CREATININE 0.67 05/07/2021  ? ? ?Lab Results  ?Component Value Date  ? HGBA1C 6.1 (H)  03/15/2021  ? ? ?Urinalysis ?   ?Component Value Date/Time  ? [place] 03/15/2021 1740  ? APPEARANCEUR Clear 04/30/2021 1449  ? LABSPEC 1.015 03/15/2021 1740  ? PHURINE 5.0 03/15/2021 1740  ? GLUCOSEU Negative 04/30/2021 1449  ? [place] NEGATIVE 03/15/2021 1740  ? BILIRUBINUR Negative 04/30/2021 1449  ? [place] NEGATIVE 03/15/2021 1740  ? PROTEINUR Negative 04/30/2021 1449  ? PROTEINUR 30 (A) 03/15/2021 1740  ? UROBILINOGEN 0.2 08/18/2019 1326  ? UROBILINOGEN 0.2 04/08/2008 1454  ? NITRITE Negative 04/30/2021 1449  ? NITRITE NEGATIVE 03/15/2021 1740  ? LEUKOCYTESUR Negative 04/30/2021 1449  ? LEUKOCYTESUR LARGE (A) 03/15/2021 1740  ? ? ?Lab Results  ?Component Value Date  ? LABMICR Comment 04/30/2021  ? WBCUA 6-10 (A) 04/25/2021  ? LABEPIT 0-10 04/25/2021  ? MUCUS Present 03/23/2021  ? BACTERIA Few 04/25/2021  ? ? ?Pertinent Imaging: ? ?No results found for this or any previous visit. ? ?No results found for this or any previous visit. ? ?No results  found for this or any previous visit. ? ?No results found for this or any previous visit. ? ?Results for orders placed during the hospital encounter of 04/11/21 ? ?Ultrasound renal complete ? ?Narrative ?CLINICAL DATA:  Nephrolithiasis.  Recent urologic procedures. ? ?EXAM: ?RENAL / URINARY TRACT ULTRASOUND COMPLETE ? ?COMPARISON:  CT [facility], 03/15/2021. US Renal, 10/28/2008. Intraoperative ?fluoroscopy, 03/16/2021. ? ?FINDINGS: ?Suboptimal evaluation, secondary to shadowing from overlying bowel ?gas. ? ?Right Kidney: ? ?Renal measurements: 12.4 x 4.7 x 5.4 cm = volume: 164 mL. Mild ?diffuse renal cortical thinning. Echogenicity within normal limits. ?Mild pelvicaliectasis. No hydronephrosis. ? ?Exophytic renal cortical cyst measuring up to 3.7 cm ? ?Left Kidney: ? ?Renal measurements: 10.4 x 6.5 x 5.2 cm = volume: 183 mL. ?Echogenicity within normal limits. No hydronephrosis. ? ?Exophytic renal cysts, measuring up to 3.3 and 2.5 cm. ? ?Bladder: ? ?Appears normal  for degree of bladder distention. Urinary jets are ?visualized. Postvoid imaging was not obtained. ? ?Other: ? ?None. ? ?IMPRESSION: ?1. Mild RIGHT pelvicaliectasis, without discrete hydronephrosis, ?after ureteral stent pl

## 2021-05-30 NOTE — Telephone Encounter (Signed)
error 

## 2021-05-30 NOTE — Telephone Encounter (Signed)
Patient called complaining of UTI symptoms. Pt placed on schedule today to see PA. ?

## 2021-05-30 NOTE — Telephone Encounter (Signed)
Wouldn't recommend anything else for the diarrhea until we have ruled out infection. Please arrange for C diff GDH and toxin A/B as well as GI pathogen panel.  ?

## 2021-06-02 LAB — URINE CULTURE

## 2021-06-04 ENCOUNTER — Telehealth: Payer: Self-pay

## 2021-06-04 NOTE — Telephone Encounter (Signed)
-----   Message from Reynaldo Minium, [place] sent at 06/04/2021  8:41 AM EDT ----- ?Please let Ms Zuniga know her cx grew an insignificant number of bacteria, but the bacteria present is sensitive to the Macrobid she was Rx.  ?----- Message ----- ?From: Interface, [facility] Lab Results In ?Sent: 05/30/2021   5:31 PM EDT ?To: Berneice Heinrich [place], PA-C ? ? ?

## 2021-06-04 NOTE — Telephone Encounter (Signed)
Left detailed message on patient voicemail. 

## 2021-06-06 ENCOUNTER — Telehealth: Payer: Self-pay | Admitting: Internal Medicine

## 2021-06-06 NOTE — Telephone Encounter (Signed)
Please call patient, she has questions about a stool sample kit and can not reach anyone at [facility]  ?

## 2021-06-07 ENCOUNTER — Telehealth: Payer: Self-pay | Admitting: Internal Medicine

## 2021-06-07 NOTE — Telephone Encounter (Signed)
See other phone note

## 2021-06-07 NOTE — Telephone Encounter (Signed)
Please call patient about her stool sample kit    ?

## 2021-06-07 NOTE — Telephone Encounter (Signed)
Returned the pt's call and LMOVM. 

## 2021-06-08 NOTE — Telephone Encounter (Signed)
Phoned the pt and was advised by her that she had finally talked to [facility] and they answered her questions ?

## 2021-06-13 ENCOUNTER — Ambulatory Visit (INDEPENDENT_AMBULATORY_CARE_PROVIDER_SITE_OTHER): Payer: Medicare Other | Admitting: Physician Assistant

## 2021-06-13 VITALS — BP 123/70 | HR 84 | Ht 64.0 in | Wt 133.0 lb

## 2021-06-13 DIAGNOSIS — N3010 Interstitial cystitis (chronic) without hematuria: Secondary | ICD-10-CM | POA: Diagnosis not present

## 2021-06-13 LAB — URINALYSIS, ROUTINE W REFLEX MICROSCOPIC
Bilirubin, UA: NEGATIVE
Glucose, UA: NEGATIVE
Ketones, UA: NEGATIVE
Nitrite, UA: NEGATIVE
Protein,UA: NEGATIVE
RBC, UA: NEGATIVE
Specific Gravity, UA: 1.02 (ref 1.005–1.030)
Urobilinogen, Ur: 0.2 mg/dL (ref 0.2–1.0)
pH, UA: 6 (ref 5.0–7.5)

## 2021-06-13 LAB — MICROSCOPIC EXAMINATION
Bacteria, UA: NONE SEEN
RBC, Urine: NONE SEEN /hpf (ref 0–2)

## 2021-06-13 MED ORDER — DIMETHYL SULFOXIDE 50 % IS SOLN
50.0000 mL | Freq: Once | INTRAVESICAL | Status: AC
  Administered 2021-06-13: 50 mL via URETHRAL

## 2021-06-13 MED ORDER — SODIUM BICARBONATE 8.4 % IV SOLN
50.0000 meq | Freq: Once | INTRAVENOUS | Status: AC
  Administered 2021-06-13: 50 meq

## 2021-06-13 MED ORDER — LIDOCAINE HCL 2 % IJ SOLN
10.0000 mL | Freq: Once | INTRAMUSCULAR | Status: AC
  Administered 2021-06-13: 200 mg

## 2021-06-13 MED ORDER — HYDROCORTISONE SOD SUC (PF) 100 MG IJ SOLR
100.0000 mg | Freq: Once | INTRAMUSCULAR | Status: AC
  Administered 2021-06-13: 100 mg

## 2021-06-13 NOTE — Progress Notes (Signed)
Bladder Instillation DMSO ? ?Due to IC patient is present today for a Bladder Instillation of DMSO treatment. Patient was cleaned and prepped in a sterile fashion with betadine.  A 18FR catheter was inserted, urine return was noted 35m, urine was yellow in color.  DMSO treatment was instilled into the bladder. The catheter was then removed. Patient tolerated well, no complications were noted pt was instructed to hold the instillation for 20 minutes and then will void it out. Pt voiced understanding.  ? ? ?Preformed by: KLevi Aland CMA ? ?Follow up/ Additional notes: Follow up as scheduled.   ?

## 2021-06-14 ENCOUNTER — Ambulatory Visit (INDEPENDENT_AMBULATORY_CARE_PROVIDER_SITE_OTHER): Payer: Medicare Other | Admitting: Gastroenterology

## 2021-06-14 ENCOUNTER — Ambulatory Visit: Payer: Medicare Other

## 2021-06-14 ENCOUNTER — Encounter: Payer: Self-pay | Admitting: Gastroenterology

## 2021-06-14 VITALS — BP 132/62 | HR 65 | Temp 97.5°F | Ht 64.0 in | Wt 134.2 lb

## 2021-06-14 DIAGNOSIS — K642 Third degree hemorrhoids: Secondary | ICD-10-CM | POA: Diagnosis not present

## 2021-06-14 NOTE — Patient Instructions (Signed)
Please continue to avoid straining. ? ?You should limit your toilet time to 2-3 minutes at the most.  ? ? ?Please call me with any concerns or issues! ? ?I will see you in follow-up for additional banding in several weeks. ? ? ?I enjoyed seeing you again today! As you know, I value our relationship and want to provide genuine, compassionate, and quality care. I welcome your feedback. If you receive a survey regarding your visit,  I greatly appreciate you taking time to fill this out. See you next time! ? ?Annitta Needs, PhD, ANP-BC ?[facility]  ? ? ? ? ? ? ?

## 2021-06-14 NOTE — Progress Notes (Signed)
? ? ?  [place] BANDING PROCEDURE NOTE ? ?Deborah Gray is an 88 y.o. female presenting today for consideration of hemorrhoid banding. Last colonoscopy  2018 with diverticulosis. She has a history of adenomatous colon polyps. She has undergone hemorrhoid banding in March/May 2021 by Dr. Gala Romney. She had grade 3 hemorrhoids on anoscopy in Dec 2022 in left lateral and right anterior position predominantly. She has had banding of left lateral.  ? ?The patient presents with symptomatic grade 3 hemorrhoids, unresponsive to maximal medical therapy, requesting rubber band ligation of his hemorrhoidal disease. All risks, benefits, and alternative forms of therapy were described and informed consent was obtained. ? ? ?The decision was made to band the right anterior internal hemorrhoid, and the [place] was used to perform band ligation without complication. Digital anorectal examination was then performed to assure proper positioning of the band, and to adjust the banded tissue as required. The patient was discharged home without pain or other issues. Dietary and behavioral recommendations were given, along with follow-up instructions. The patient will return in several weeks for followup and possible additional banding as required. ? ?No complications were encountered and the patient tolerated the procedure well.  ? ?Annitta Needs, PhD, ANP-BC ?[facility]  ? ?

## 2021-06-18 ENCOUNTER — Telehealth: Payer: Self-pay | Admitting: Internal Medicine

## 2021-06-18 ENCOUNTER — Telehealth: Payer: Self-pay | Admitting: *Deleted

## 2021-06-18 NOTE — Telephone Encounter (Signed)
I returned the pt's call and she advised me that the Levsin was 59.00 for 23 pills and she can get 90 pills for 20.00. She asked if you would send the Rx in for Qty #90.  ?

## 2021-06-18 NOTE — Telephone Encounter (Signed)
Pt has questions about her Levsin prescription. She said it was too expensive and asked to get it in a 90 day supply. [phone] ?

## 2021-06-18 NOTE — Telephone Encounter (Signed)
Patient stated she needs a refill of her Starlix to [facility]- she would like to stay on this med originally started by Dr Emilee Hero for her sluggish pancreas. She would like a 90 day supply ?

## 2021-06-19 ENCOUNTER — Other Ambulatory Visit: Payer: Self-pay | Admitting: Family Medicine

## 2021-06-19 DIAGNOSIS — E1100 Type 2 diabetes mellitus with hyperosmolarity without nonketotic hyperglycemic-hyperosmolar coma (NKHHC): Secondary | ICD-10-CM

## 2021-06-19 MED ORDER — HYOSCYAMINE SULFATE 0.125 MG SL SUBL
0.1250 mg | SUBLINGUAL_TABLET | Freq: Every day | SUBLINGUAL | 2 refills | Status: DC | PRN
Start: 2021-06-19 — End: 2021-11-07

## 2021-06-19 MED ORDER — NATEGLINIDE 120 MG PO TABS: 120.0000 mg | ORAL_TABLET | Freq: Three times a day (TID) | ORAL | 1 refills | Status: DC

## 2021-06-19 NOTE — Telephone Encounter (Signed)
Left message to return call 

## 2021-06-19 NOTE — Addendum Note (Signed)
Addended by: Annitta Needs on: 06/19/2021 09:12 AM ? ? Modules accepted: Orders ? ?

## 2021-06-19 NOTE — Telephone Encounter (Signed)
Sent in #90. Thanks! ?

## 2021-06-19 NOTE — Telephone Encounter (Signed)
Cook, Jayce G, DO   ? ?Rx sent.   ? ?

## 2021-06-19 NOTE — Telephone Encounter (Signed)
Phoned and advised the pt of the qty amount of Rx being sent in ?

## 2021-06-19 NOTE — Telephone Encounter (Signed)
Patient notified

## 2021-06-20 DIAGNOSIS — Z1283 Encounter for screening for malignant neoplasm of skin: Secondary | ICD-10-CM | POA: Diagnosis not present

## 2021-06-20 DIAGNOSIS — L218 Other seborrheic dermatitis: Secondary | ICD-10-CM | POA: Diagnosis not present

## 2021-06-20 DIAGNOSIS — L570 Actinic keratosis: Secondary | ICD-10-CM | POA: Diagnosis not present

## 2021-06-26 DIAGNOSIS — H34832 Tributary (branch) retinal vein occlusion, left eye, with macular edema: Secondary | ICD-10-CM | POA: Diagnosis not present

## 2021-06-26 LAB — GASTROINTESTINAL PATHOGEN PNL
CampyloBacter Group: NOT DETECTED
Norovirus GI/GII: NOT DETECTED
Rotavirus A: NOT DETECTED
Salmonella species: NOT DETECTED
Shiga Toxin 1: NOT DETECTED
Shiga Toxin 2: NOT DETECTED
Shigella Species: NOT DETECTED
Vibrio Group: NOT DETECTED
Yersinia enterocolitica: NOT DETECTED

## 2021-06-26 LAB — CRYPTOSPORIDIUM ANTIGEN, EIA
Specimen Quality:: ADEQUATE
micro Number:: 13334282

## 2021-06-26 LAB — GIARDIA ANTIGEN
MICRO NUMBER:: 13339308
RESULT:: NOT DETECTED
SPECIMEN QUALITY:: ADEQUATE

## 2021-06-26 LAB — C. DIFFICILE GDH AND TOXIN A/B
GDH ANTIGEN: NOT DETECTED
MICRO NUMBER:: 13335434
TOXIN A AND B: NOT DETECTED

## 2021-06-28 ENCOUNTER — Encounter: Payer: Self-pay | Admitting: Gastroenterology

## 2021-06-28 ENCOUNTER — Ambulatory Visit (INDEPENDENT_AMBULATORY_CARE_PROVIDER_SITE_OTHER): Payer: Medicare Other | Admitting: Gastroenterology

## 2021-06-28 VITALS — BP 122/62 | HR 89 | Temp 98.2°F | Ht 64.0 in | Wt 133.4 lb

## 2021-06-28 DIAGNOSIS — K642 Third degree hemorrhoids: Secondary | ICD-10-CM

## 2021-06-28 NOTE — Progress Notes (Signed)
? ? ?  [place] BANDING PROCEDURE NOTE ? ?Deborah Gray is an 88 y.o. female presenting today for consideration of hemorrhoid banding. Last colonoscopy 2018 with diverticulosis. She has a history of adenomatous colon polyps. She has undergone hemorrhoid banding in March/May 2021 by Dr. Gala Romney. She has had left lateral banding and right anterior. Improvement noted overall. Still with pressure and low-volume bleeding.  ? ? ?The patient presents with symptomatic grade 3 hemorrhoids, unresponsive to maximal medical therapy, requesting rubber band ligation of his/her hemorrhoidal disease. All risks, benefits, and alternative forms of therapy were described and informed consent was obtained. ? ?The decision was made to band neutrally, and the [place] was used to perform band ligation without complication. Digital anorectal examination was then performed to assure proper positioning of the band, and to adjust the banded tissue as required. The patient was discharged home without pain or other issues. Dietary and behavioral recommendations were given, along with follow-up instructions. The patient will return to the office in July for routine visit. She may need additional banding and will call if necessary.  ? ?No complications were encountered and the patient tolerated the procedure well.  ? ?Annitta Needs, PhD, ANP-BC ?[facility]  ? ?

## 2021-06-28 NOTE — Patient Instructions (Signed)
Please continue to avoid straining. ? ?You should limit your toilet time to 2-3 minutes at the most.  ? ?Continue to avoid constipation. ? ?Please call me with any concerns or issues! ? ?We may need to do another banding; if you find you still have symptoms, please call! ? ?Otherwise, [name] will see you in July! ? ?I enjoyed seeing you again today! As you know, I value our relationship and want to provide genuine, compassionate, and quality care. I welcome your feedback. If you receive a survey regarding your visit,  I greatly appreciate you taking time to fill this out. See you next time! ? ?Annitta Needs, PhD, ANP-BC ?[facility]  ? ? ? ? ? ? ? ? ? ?

## 2021-07-09 NOTE — Telephone Encounter (Signed)
Pt LMOVM regarding being in terrible pain and burning in the abdomen from one hip to the other hip. She stated she has taken Mylanta and its not helping, she has a heating pad on her abdomen as well. Burning from throat to stomach. Will not take Omeprazole on a daily basis because it bothers her mentally. I asked her had she taken her Levsin and she said only if she cramps. Pt has had diarrhea x 2 weeks. Runny no form to it. She has taken Imodium for it.  Pt  complains of pain while you were doing her banding and think you may hit a ulcer or nerve. Pt states that her pain is leveled off since the heating pad its just sore. The pt states she is just trying to figure out since Easter why these episodes come and go. Please advise.

## 2021-07-10 NOTE — Telephone Encounter (Signed)
Phoned the pt back and she is not willing to trial pantoprazole. She would like to speak to someone (Dr's) about the side affects of the PPI meds. She states that before she takes anything different she wants to speak with the Dr. She advised that [name] knows if she took this in the past and what it did to her memory.

## 2021-07-10 NOTE — Telephone Encounter (Signed)
Please put [place] on Deborah Gray's schedule for tomorrow @ 11:30 am. Pt agreed to this appt.

## 2021-07-10 NOTE — Telephone Encounter (Signed)
Phoned and LMOVM for the pt to return call 

## 2021-07-10 NOTE — Telephone Encounter (Signed)
This isn't related to the banding as she has had this issue prior.   If not taking omeprazole daily, would she be willing to trial pantoprazole? I can send this in to her pharmacy. It sounds like she may need a regular office visit to be seen.   [name] has been following for the chronic issues and I just saw for banding. Regardless, let's find a spot for her in next 2 weeks.

## 2021-07-10 NOTE — Telephone Encounter (Addendum)
Patient needs an office visit tomorrow. Deborah Gray said she can do an 1130. If patient can't do that, need to find something else this week.

## 2021-07-11 ENCOUNTER — Encounter: Payer: Self-pay | Admitting: Gastroenterology

## 2021-07-11 ENCOUNTER — Ambulatory Visit (HOSPITAL_COMMUNITY)
Admission: RE | Admit: 2021-07-11 | Discharge: 2021-07-11 | Disposition: A | Discharge status: Home / Self Care | Payer: Medicare Other | Source: Ambulatory Visit | Attending: Urology | Admitting: Urology

## 2021-07-11 ENCOUNTER — Ambulatory Visit (INDEPENDENT_AMBULATORY_CARE_PROVIDER_SITE_OTHER): Payer: Medicare Other | Admitting: Gastroenterology

## 2021-07-11 VITALS — BP 124/62 | HR 76 | Temp 97.8°F | Ht 64.0 in | Wt 134.6 lb

## 2021-07-11 DIAGNOSIS — K219 Gastro-esophageal reflux disease without esophagitis: Secondary | ICD-10-CM | POA: Diagnosis not present

## 2021-07-11 DIAGNOSIS — N200 Calculus of kidney: Secondary | ICD-10-CM | POA: Diagnosis not present

## 2021-07-11 DIAGNOSIS — K580 Irritable bowel syndrome with diarrhea: Secondary | ICD-10-CM | POA: Diagnosis not present

## 2021-07-11 DIAGNOSIS — K224 Dyskinesia of esophagus: Secondary | ICD-10-CM | POA: Diagnosis not present

## 2021-07-11 DIAGNOSIS — R6881 Early satiety: Secondary | ICD-10-CM | POA: Insufficient documentation

## 2021-07-11 DIAGNOSIS — N281 Cyst of kidney, acquired: Secondary | ICD-10-CM | POA: Diagnosis not present

## 2021-07-11 DIAGNOSIS — K861 Other chronic pancreatitis: Secondary | ICD-10-CM

## 2021-07-11 DIAGNOSIS — Z87442 Personal history of urinary calculi: Secondary | ICD-10-CM | POA: Diagnosis not present

## 2021-07-11 MED ORDER — FAMOTIDINE 40 MG PO TABS: ORAL_TABLET | ORAL | 5 refills | Status: DC

## 2021-07-11 NOTE — Progress Notes (Signed)
GI Office Note    Referring Provider: Coral Spikes, DO Primary Care Physician:  Coral Spikes, DO  Primary Gastroenterologist: Garfield Cornea, MD   Chief Complaint   Chief Complaint  Patient presents with   Dysphagia    Feels like food is stuck in throat and feels full even though she hasn't ate much.    Diarrhea    Has episodes where she can barely make it to the bathroom. Has cramping with it.     History of Present Illness   Deborah Gray is a 88 y.o. female presenting today for urgent visit regarding abdominal pain.   She has longstanding history of GERD, intermittent diarrhea, bloating, hemorrhoids. Also with history of pancreatic exocrine insufficiency, diarrhea. Colonoscopy 03/2016 with diverticulosis. She has a history of adenomatous colon polyps. Also with nutcracker esophagus, remote esophageal strictures, esophageal spasms improved on imipramine in the past, idiopathic chronic pancreatitis on Creon, IBS.  EGD March 2017 with diffuse moderate inflammation characterized by edema, erythema, linear erosions in the entire examined stomach, biopsy showed reactive gastropathy.  Creon with meals and snacks.  If she has abdominal cramping or loose stool she will take Levsin.  Takes Hardin Negus' colon health daily.  Takes Librax for flare of esophageal spasms.  Sometimes uses Imodium if needed.  Takes omeprazole at times for heartburn but reluctant due to concern for causing memory issues.  Patient has had additional hemorrhoid banding, 3 sessions in the last 6 months.  Last banding completed Jun 28, 2021.  Today: Patient called in this week complaining of pain and burning in the abdomen across the lower abdomen, not relieved with Mylanta.  Also with burning from the throat to the stomach, takes omeprazole only when she really needs it because of it "affects her short term memory". Recent stool studies negative for Giardia antigen, cryptosporidium antigen, GI pathogen panel  negative, C. difficile GDH negative. She states she has had increased issues with her stomach this year. First started having worsening abdominal pain around 01/2021. She put up with symptoms for awhile but eventually was diagnosed with kidney stone. CT abdomen pelvis with contrast January 2023 for left lower quadrant pain, fever showed obstructing 7 mm stone in the distal right ureter with moderate to advanced right hydroureteronephrosis. She had to have cystoscopy with right ureteroscopic stone manipulation with laser lithotripsy and stent placement in 03/2021. She continues to follow with urology.   Around Easter she started having further abdominal pain, more than baseline. Bad gas pain associated with fecal urgency and incontinence. Soft/runny stools. Thought she may have had bug due to others around her sick. Weekend before Mother's day had another bought. Symptoms also associated with abdominal bloating/distention. Stomach gets tight and pushes on diaphragm. She complains of feeling full. Eats lunch at 12:30. And normally eats at 5pm but not hungry. When gets bout severe lower abdominal pain, she gest stuck in the bathroom with runny stools and urgency. Afraid to go anywhere. Has tried imodium per Dr. Gala Romney. If takes only one does ok. But if takes two, then increased gas and can't go. Sometimes takes her Levsin, and if episode is mild it will sometimes delay symptoms until she gets home. Last bout Sunday night.    She moved into [facility] in 02/2021, the independent living apartments. Food choices are not what she normally would eat. She is having to pick the best options offered that she thinks she will tolerate.   Feels food like it  stops in throat chest. Taste for hours. Spell the other day at [facility], ate Egg salad sandwich, severe burning in the chest. Ordered ice cream and tea to try and calm it down. Then got severe pain the belly, burning into chest. Took the "chalky" stuff. Put heating pad on  stomach/chest and went to sleep. When woke up she felt better.      Medications   Current Outpatient Medications  Medication Sig Dispense Refill   acetaminophen (TYLENOL) 500 MG tablet Take 500 mg by mouth at bedtime.     aspirin EC 81 MG tablet Take 81 mg by mouth at bedtime.     Azelastine-Fluticasone 137-50 MCG/ACT SUSP Place 1 spray into the nose every 12 (twelve) hours. 23 g 0   blood glucose meter kit and supplies KIT Dispense based on  insurance preference. Tests once a day E11.9 1 each 0   Carboxymethylcellulose Sodium (THERATEARS) 0.25 % SOLN Place 1 drop into both eyes 2 (two) times daily.     cholecalciferol (VITAMIN D3) 25 MCG (1000 UNIT) tablet Take 1,000 Units by mouth daily.     ezetimibe (ZETIA) 10 MG tablet TAKE 1 TABLET BY MOUTH EVERY DAY (Patient taking differently: Take 10 mg by mouth at bedtime.) 90 tablet 1          hyoscyamine (LEVSIN SL) 0.125 MG SL tablet Take 1 tablet (0.125 mg total) by mouth daily as needed for cramping. 90 tablet 2   ketoconazole (NIZORAL) 2 % cream Apply 1 application topically daily as needed for irritation (to affected area. external use only). (Patient taking differently: Apply 1 application. topically daily.) 30 g 0   losartan (COZAAR) 50 MG tablet Take 1 tablet (50 mg total) by mouth daily. 90 tablet 3   meclizine (ANTIVERT) 25 MG tablet TAKE 1 TABLET BY MOUTH 3 TIMES A DAY AS NEEDED (Patient taking differently: Take 25 mg by mouth daily as needed for dizziness.) 30 tablet 1   metFORMIN (GLUCOPHAGE) 500 MG tablet TAKE ONE TABLET BY MOUTH EVERY MORNING ONE AT LUNCH AND 2 AT BEDTIME 360 tablet 1   methenamine (HIPREX) 1 g tablet Take 1 tablet (1 g total) by mouth 2 (two) times daily. 60 tablet 11   mirabegron ER (MYRBETRIQ) 50 MG TB24 tablet Take 1 tablet (50 mg total) by mouth daily. 90 tablet 3   Multiple Vitamins-Minerals (PRESERVISION AREDS 2) CAPS Take 1 capsule by mouth 2 (two) times daily.      nateglinide (STARLIX) 120 MG tablet Take 1  tablet (120 mg total) by mouth 3 (three) times daily with meals. 270 tablet 1   omega-3 acid ethyl esters (LOVAZA) 1 g capsule Take 2 g by mouth daily.     omeprazole (PRILOSEC) 20 MG capsule TAKE 1 CAPSULE BY MOUTH EVERY DAY 90 capsule 3   Pancrelipase, Lip-Prot-Amyl, (CREON) 24000-76000 units CPEP TAKE 3 CAPSULES THREE TIMES DAILY WITH MEALS AND ONE CAPSULE WITH SNACK TWICE DAILY 1000 capsule 5   traMADol (ULTRAM) 50 MG tablet Take 1 tablet (50 mg total) by mouth every 6 (six) hours as needed. 30 tablet 0   Vitamin Mixture (ESTER-C) 500-60 MG TABS Take 1 tablet by mouth daily.      No current facility-administered medications for this visit.    Allergies   Allergies as of 07/11/2021 - Review Complete 07/11/2021  Allergen Reaction Noted   Adhesive [tape] Other (See Comments) 07/21/2016   Estrogens Other (See Comments) 10/15/2012   Sulfonamide derivatives Rash 03/01/2008  Review of Systems   General: Negative for anorexia, weight loss, fever, chills, fatigue, weakness. ENT: Negative for hoarseness, nasal congestion. See hpi CV: Negative for chest pain, angina, palpitations, dyspnea on exertion, peripheral edema.  Respiratory: Negative for dyspnea at rest, dyspnea on exertion, cough, sputum, wheezing.  GI: See history of present illness. GU:  Negative for dysuria, hematuria, urinary incontinence, urinary frequency, nocturnal urination. See hpi Endo: Negative for unusual weight change.     Physical Exam   BP 124/62 (BP Location: Right Arm, Patient Position: Sitting, Cuff Size: Normal)   Pulse 76   Temp 97.8 F (36.6 C) (Oral)   Ht _0  (1.626 m)   Wt 134 lb 9.6 oz (61.1 kg)   SpO2 100%   BMI 23.10 kg/m    General: Well-nourished, well-developed in no acute distress.  Eyes: No icterus. Mouth: Oropharyngeal mucosa moist and pink , no lesions erythema or exudate. Lungs: Clear to auscultation bilaterally.  Heart: Regular rate and rhythm, no murmurs rubs or gallops.   Abdomen: Bowel sounds are normal, nontender, nondistended, no hepatosplenomegaly or masses,  no abdominal bruits or hernia , no rebound or guarding.  Rectal: not performed  Extremities: No lower extremity edema. No clubbing or deformities. Neuro: Alert and oriented x 4   Skin: Warm and dry, no jaundice.   Psych: Alert and cooperative, normal mood and affect.  Labs   Lab Results  Component Value Date   CREATININE 0.67 05/07/2021   BUN 33 (H) 05/07/2021   NA 140 05/07/2021   K 5.0 05/07/2021   CL 102 05/07/2021   CO2 25 05/07/2021   Lab Results  Component Value Date   WBC 8.0 05/07/2021   HGB 12.9 05/07/2021   HCT 38.9 05/07/2021   MCV 87 05/07/2021   PLT 247 05/07/2021   Lab Results  Component Value Date   ALT 15 05/07/2021   AST 16 05/07/2021   ALKPHOS 77 05/07/2021   BILITOT 0.4 05/07/2021   Lab Results  Component Value Date   TSH 1.760 05/07/2021    Imaging Studies   No results found.  Assessment   88 year old lady with multiple GI issues including history of nutcracker esophagus, idiopathic chronic pancreatitis with chronic pancreatic exocrine insufficiency, IBS, hemorrhoids, GERD presenting for follow-up.  Over the past several months she has had flare of reflux symptoms, episodes of abdominal cramping associated with loose stool/fecal urgency. Recently has also noted some early satiety and swallowing concerns at times with certain foods. She takes her Creon daily but uses other GI medications sparingly (omeprazole, Levsin, Librax). Uses Levsin and Imodium with some improvement in diarrhea, and is careful with imodium as it can constipate her. Symptoms occurring about once per week. Recent stool studies negative. Recent CT imagining as outlined above. I suspect flare of her chronic symptoms (GERD, IBS) possibly exacerbated by change in diet since moving into independent living facility where her foods are prepared for her.  We discussed possibility of upper  endoscopy but she wants to try medication first. She is reluctant to use PPI regularly due to concern for memory issues.    PLAN   Start famotidine for acid reflux. Take either one whole tablet once daily OR 1/2 tablet twice daily. Be consistent over the next few weeks to see if stomach symptoms settle down. If you are feeling better, then you can try using famotidine only when needed for acid reflux, bloating, indigestion.  Continue Librax as needed for esophageal spasms. Continue Levsin for abdominal  cramping and loose stools. At onset of symptoms, go ahead and take one Levsin to see if you can stop the process. On bad days, you can take up to 3 if needed. May cause dry mouth if using regularly.  Continue Creon 3 capsules with meals and 2 with snacks. Continue probiotic daily.  Call in a couple of weeks if symptoms do not settle down.  Return to the office in six months.    Laureen Ochs. Bobby Rumpf, [place], [facility]

## 2021-07-11 NOTE — Patient Instructions (Addendum)
Start famotidine for acid reflux. Take either one whole tablet once daily OR 1/2 tablet twice daily. Be consistent over the next few weeks to see if your stomach symptoms settle down. If you are feeling better, then you can try using famotidine only when needed for acid reflux, bloating, indigestion.  Continue Librax as needed for esophageal spasms. Continue Levsin for abdominal cramping and loose stools. At onset of symptoms, go ahead and take one Levsin to see if you can stop the process. On bad days, you can take up to 3 if needed. May cause dry mouth if using regularly.  Continue Creon 3 capsules with meals and 2 with snacks. Continue probiotic daily.  Call in a couple of weeks if symptoms do not settle down.  Return to the office in six months.

## 2021-07-14 ENCOUNTER — Other Ambulatory Visit: Payer: Self-pay | Admitting: Student

## 2021-07-18 ENCOUNTER — Ambulatory Visit (INDEPENDENT_AMBULATORY_CARE_PROVIDER_SITE_OTHER): Payer: Medicare Other | Admitting: Physician Assistant

## 2021-07-18 VITALS — BP 152/72 | HR 92 | Ht 64.0 in | Wt 132.0 lb

## 2021-07-18 DIAGNOSIS — N3000 Acute cystitis without hematuria: Secondary | ICD-10-CM | POA: Diagnosis not present

## 2021-07-18 DIAGNOSIS — R8271 Bacteriuria: Secondary | ICD-10-CM

## 2021-07-18 DIAGNOSIS — N3010 Interstitial cystitis (chronic) without hematuria: Secondary | ICD-10-CM

## 2021-07-18 LAB — URINALYSIS, ROUTINE W REFLEX MICROSCOPIC
Bilirubin, UA: NEGATIVE
Glucose, UA: NEGATIVE
Ketones, UA: NEGATIVE
Nitrite, UA: POSITIVE — AB
Protein,UA: NEGATIVE
Specific Gravity, UA: 1.02 (ref 1.005–1.030)
Urobilinogen, Ur: 0.2 mg/dL (ref 0.2–1.0)
pH, UA: 6 (ref 5.0–7.5)

## 2021-07-18 LAB — MICROSCOPIC EXAMINATION
Renal Epithel, UA: NONE SEEN /hpf
WBC, UA: >30 /hpf — AB (ref 0–5)

## 2021-07-18 MED ORDER — NITROFURANTOIN MONOHYD MACRO 100 MG PO CAPS: 100.0000 mg | ORAL_CAPSULE | Freq: Two times a day (BID) | ORAL | 0 refills | Status: AC

## 2021-07-18 NOTE — Progress Notes (Unsigned)
Assessment: 1. Interstitial cystitis - Urinalysis, Routine w reflex microscopic    Plan: ***  Chief Complaint: No chief complaint on file.   HPI: Deborah Gray is a 88 y.o. female who presents for continued evaluation of ***.   Had diarrhea last week and then started UTI sxs 2 days ago. No gross hematuria. Incomplete emptying and frequency Portions of the above documentation were copied from a prior visit for review purposes only.  Allergies: Allergies  Allergen Reactions   Adhesive [Tape] Other (See Comments)    Reaction:  Blisters    Estrogens Other (See Comments)    Reaction:  Migraines    Sulfonamide Derivatives Rash    PMH: Past Medical History:  Diagnosis Date   Arthritis    Benign neoplasm of colon    Constipation    Diaphragmatic hernia without mention of obstruction or gangrene    Diverticulosis of colon (without mention of hemorrhage)    Dysphagia, pharyngoesophageal phase    Esophageal reflux    Essential hypertension    Heart murmur    Hemorrhoids    Hyperlipidemia    Internal hemorrhoids without mention of complication    Left wrist fracture    PONV (postoperative nausea and vomiting)    Stricture and stenosis of esophagus    Type 2 diabetes mellitus (HCC)    Unspecified gastritis and gastroduodenitis without mention of hemorrhage     PSH: Past Surgical History:  Procedure Laterality Date   ABDOMINAL HYSTERECTOMY     BACTERIAL OVERGROWTH TEST N/A 09/06/2015   Procedure: BACTERIAL OVERGROWTH TEST;  Surgeon: Daneil Dolin, MD;  Location: [facility];  Service: Endoscopy;  Laterality: N/A;  800   BREAST LUMPECTOMY Right    benign   CATARACT EXTRACTION Bilateral 2006   Implants in both, surgeries done 6 weeks apart   CHOLECYSTECTOMY     COLONOSCOPY  03/08/02   [name]: diverticulosis, internal hemorrhoids, adenomatous colon polyp   COLONOSCOPY  01/23/04   [name]: diverticulosis, internal hemorrhoids   COLONOSCOPY  09/25/05    [name]: diverticulosis   COLONOSCOPY  07/05/09   [name]: severe diverticulosis   COLONOSCOPY  01/21/11   [name]: severe diverticulosis in sigmoid to desc colon, int hemorrhoids, follow up TCS in 5 years   COLONOSCOPY N/A 04/10/2016   [name]: diverticulosis   CYSTOSCOPY W/ URETERAL STENT PLACEMENT Right 03/15/2021   Procedure: CYSTOSCOPY WITH RETROGRADE PYELOGRAM/URETERAL STENT PLACEMENT;  Surgeon: Janith Lima, MD;  Location: [facility];  Service: Urology;  Laterality: Right;   CYSTOSCOPY WITH RETROGRADE PYELOGRAM, URETEROSCOPY AND STENT PLACEMENT Right 03/29/2021   Procedure: CYSTOSCOPY WITH RETROGRADE PYELOGRAM, URETEROSCOPY AND STENT EXCHANGE;  Surgeon: Cleon Gustin, MD;  Location: [facility];  Service: Urology;  Laterality: Right;   DILATION AND CURETTAGE OF UTERUS     ESOPHAGEAL MANOMETRY  03/21/08   [place]: findings c/w Nutcracker Esophagus   ESOPHAGOGASTRODUODENOSCOPY  03/08/02   [name]: esophageal stricture, chronic gerd, s/p dilation.    ESOPHAGOGASTRODUODENOSCOPY  01/23/04   [name]: esophageal stricture, gastritis, hiatal hernia   ESOPHAGOGASTRODUODENOSCOPY  09/25/05   [name]: gastritis, benign bx, no H.pylori   ESOPHAGOGASTRODUODENOSCOPY  07/05/09   [name]: gastropathy, benign small bowel bx and gastric bx   ESOPHAGOGASTRODUODENOSCOPY N/A 05/15/2015   Dr. Gala Romney- diffuse moderate inflammation characterized by congestion (edema), erythema, and linear erosions was found in the entire examined stomach. bx= reactive gastropathy   FOOT SURGERY Left    HEMORRHOID BANDING  2017   Dr.[name]   HOLMIUM LASER APPLICATION Right  03/29/2021   Procedure: HOLMIUM LASER APPLICATION;  Surgeon: Cleon Gustin, MD;  Location: [facility];  Service: Urology;  Laterality: Right;   LAPAROSCOPIC VAGINAL HYSTERECTOMY     ORIF WRIST FRACTURE Left 06/23/2018   Procedure: OPEN REDUCTION INTERNAL FIXATION (ORIF) LEFT WRIST FRACTURE;  Surgeon: Renette Butters, MD;  Location: [facility];  Service: Orthopedics;  Laterality: Left;  regional arm block   RECTOCELE REPAIR     TOTAL KNEE ARTHROPLASTY Right     SH: Social History   Tobacco Use   Smoking status: Never   Smokeless tobacco: Never   Tobacco comments:    Never smoked  Vaping Use   Vaping Use: Never used  Substance Use Topics   Alcohol use: No    Alcohol/week: 0.0 standard drinks   Drug use: No    ROS: Constitutional:  Negative for fever, chills, weight loss CV: Negative for chest pain Respiratory:  Negative for shortness of breath, wheezing, sleep apnea, frequent cough GI:  Negative for nausea, vomiting, bloody stool, GERD  PE: BP (!) 152/72   Pulse 92   Ht '5\' 4"'$  (1.626 m)   Wt 132 lb (59.9 kg)   BMI 22.66 kg/m  GENERAL APPEARANCE:  Well appearing, well developed, well nourished, NAD HEENT:  Atraumatic, normocephalic NECK:  Supple. Trachea midline ABDOMEN:  Soft, non-tender, no masses EXTREMITIES:  Moves all extremities well, without clubbing, cyanosis, or edema NEUROLOGIC:  Alert and oriented x 3, normal gait, CN II-XII grossly intact MENTAL STATUS:  appropriate BACK:  Non-tender to palpation, No CVAT SKIN:  Warm, dry, and intact   Results: Laboratory Data: Lab Results  Component Value Date   WBC 8.0 05/07/2021   HGB 12.9 05/07/2021   HCT 38.9 05/07/2021   MCV 87 05/07/2021   PLT 247 05/07/2021    Lab Results  Component Value Date   CREATININE 0.67 05/07/2021    No results found for: PSA  No results found for: TESTOSTERONE  Lab Results  Component Value Date   HGBA1C 6.1 (H) 03/15/2021    Urinalysis    Component Value Date/Time   COLORURINE YELLOW 03/15/2021 1740   APPEARANCEUR Clear 06/13/2021 1636   LABSPEC 1.015 03/15/2021 1740   PHURINE 5.0 03/15/2021 1740   GLUCOSEU Negative 06/13/2021 1636   HGBUR NEGATIVE 03/15/2021 1740   BILIRUBINUR Negative 06/13/2021 1636   KETONESUR NEGATIVE 03/15/2021 1740   PROTEINUR Negative 06/13/2021 1636   PROTEINUR 30  (A) 03/15/2021 1740   UROBILINOGEN 0.2 08/18/2019 1326   UROBILINOGEN 0.2 04/08/2008 1454   NITRITE Negative 06/13/2021 1636   NITRITE NEGATIVE 03/15/2021 1740   LEUKOCYTESUR Trace (A) 06/13/2021 1636   LEUKOCYTESUR LARGE (A) 03/15/2021 1740    Lab Results  Component Value Date   LABMICR See below: 06/13/2021   WBCUA 6-10 (A) 06/13/2021   LABEPIT 0-10 06/13/2021   MUCUS Present 03/23/2021   BACTERIA None seen 06/13/2021    Pertinent Imaging: *** No results found for this or any previous visit.  No results found for this or any previous visit.  No results found for this or any previous visit.  No results found for this or any previous visit.  Results for orders placed during the hospital encounter of 07/11/21  US RENAL  Narrative CLINICAL DATA:  History of nephrolithiasis.  Follow-up exam.  EXAM: RENAL / URINARY TRACT ULTRASOUND COMPLETE  COMPARISON:  Ultrasound renal 04/11/2021  FINDINGS: Right Kidney:  Renal measurements: 12.5 x 5.3 x 4.8 cm = volume: 166.2  mL. Echogenicity within normal limits. No mass or hydronephrosis visualized. Superior pole cyst measuring up to 3.5 cm.  Left Kidney:  Renal measurements: 11.3 x 5.7 x 5.0 cm = volume: 169.3 mL. Echogenicity within normal limits. No mass or hydronephrosis visualized. There are multiple cysts measuring up to 3 cm within the superior pole. There are at least 2 echogenic shadowing foci favored to represent stones measuring up to 5 mm.  Bladder:  Appears normal for degree of bladder distention.  Other:  None.  IMPRESSION: No hydronephrosis.  Probable left nephrolithiasis.   Electronically Signed By: Lovey Newcomer M.D. On: 07/12/2021 08:42  No results found for this or any previous visit.  No results found for this or any previous visit.  No results found for this or any previous visit.  No results found for this or any previous visit (from the past 24 hour(s)).

## 2021-07-21 LAB — URINE CULTURE

## 2021-07-24 ENCOUNTER — Other Ambulatory Visit: Payer: Self-pay | Admitting: Urology

## 2021-07-24 DIAGNOSIS — N3010 Interstitial cystitis (chronic) without hematuria: Secondary | ICD-10-CM

## 2021-07-25 ENCOUNTER — Telehealth: Payer: Self-pay

## 2021-07-25 ENCOUNTER — Ambulatory Visit (INDEPENDENT_AMBULATORY_CARE_PROVIDER_SITE_OTHER): Payer: Medicare Other | Admitting: Physician Assistant

## 2021-07-25 VITALS — BP 168/74 | HR 98 | Ht 64.0 in | Wt 132.0 lb

## 2021-07-25 DIAGNOSIS — N3010 Interstitial cystitis (chronic) without hematuria: Secondary | ICD-10-CM

## 2021-07-25 DIAGNOSIS — Z87440 Personal history of urinary (tract) infections: Secondary | ICD-10-CM | POA: Diagnosis not present

## 2021-07-25 LAB — URINALYSIS, ROUTINE W REFLEX MICROSCOPIC
Bilirubin, UA: NEGATIVE
Glucose, UA: NEGATIVE
Nitrite, UA: NEGATIVE
Protein,UA: NEGATIVE
RBC, UA: NEGATIVE
Specific Gravity, UA: 1.02 (ref 1.005–1.030)
Urobilinogen, Ur: 0.2 mg/dL (ref 0.2–1.0)
pH, UA: 6 (ref 5.0–7.5)

## 2021-07-25 LAB — MICROSCOPIC EXAMINATION
RBC, Urine: NONE SEEN /hpf (ref 0–2)
Renal Epithel, UA: NONE SEEN /hpf

## 2021-07-25 MED ORDER — LIDOCAINE HCL 2 % IJ SOLN
10.0000 mL | Freq: Once | INTRAMUSCULAR | Status: AC
  Administered 2021-07-25: 200 mg

## 2021-07-25 MED ORDER — SODIUM BICARBONATE 8.4 % IV SOLN
50.0000 meq | Freq: Once | INTRAVENOUS | Status: AC
  Administered 2021-07-25: 50 meq

## 2021-07-25 MED ORDER — DIMETHYL SULFOXIDE 50 % IS SOLN
50.0000 mL | Freq: Once | INTRAVESICAL | Status: AC
  Administered 2021-07-25: 50 mL via URETHRAL

## 2021-07-25 MED ORDER — HYDROCORTISONE SOD SUC (PF) 100 MG IJ SOLR
100.0000 mg | Freq: Once | INTRAMUSCULAR | Status: AC
  Administered 2021-07-25: 100 mg

## 2021-07-25 NOTE — Progress Notes (Signed)
Bladder Instillation DMSO  Due to Insterstitial cystitis patient is present today for a Bladder Instillation of DMSO treatment. Patient was cleaned and prepped in a sterile fashion with betadine.  A 18FR catheter was inserted, urine return was noted 30m, urine was yellow in color.  DMSO treatment was instilled into the bladder. The catheter was then removed. Patient tolerated well, no complications were noted pt was instructed to hold the instillation for 20 minutes and then will void it out. Pt voiced understanding.    Preformed by: KLevi Aland CMA  Follow up/ Additional notes: Follow up as scheduled.

## 2021-07-25 NOTE — Telephone Encounter (Signed)
Documentation from [facility] needing Dx codes for this pt's labs on Jun 28, 2021. On your desk in yellow folder

## 2021-07-26 ENCOUNTER — Ambulatory Visit: Payer: Medicare Other | Admitting: Physician Assistant

## 2021-07-27 NOTE — Progress Notes (Signed)
Assessment: 1. Interstitial cystitis - Urinalysis, Routine w reflex microscopic - dimethyl sulfoxide 50 % solution 50 mL - sodium bicarbonate injection 50 mEq - lidocaine (XYLOCAINE) 2 % (with pres) injection 200 mg - hydrocortisone sodium succinate (SOLU-CORTEF) 100 MG injection 100 mg  2. History of recurrent UTIs    Plan: DMSO treatment instilled today and she will keep appointment next month for next scheduled treatment with UA.  Chief Complaint: No chief complaint on file.   HPI: Deborah Gray is a 88 y.o. female who presents for continued evaluation of IC with scheduled DMSO.  She recently had to delay treatment due to UTI symptoms.  Culture on 07/18/2021 grew greater than 100,000 colonies of E. coli.  Sensitive to [place] which she has completed.  UTI symptoms have resolved.  UA = 0-5 WBCs, few bacteria, nitrite negative   Portions of the above documentation were copied from a prior visit for review purposes only.  Allergies: Allergies  Allergen Reactions   Adhesive [Tape] Other (See Comments)    Reaction:  Blisters    Estrogens Other (See Comments)    Reaction:  Migraines    Sulfonamide Derivatives Rash    PMH: Past Medical History:  Diagnosis Date   Arthritis    Benign neoplasm of colon    Constipation    Diaphragmatic hernia without mention of obstruction or gangrene    Diverticulosis of colon (without mention of hemorrhage)    Dysphagia, pharyngoesophageal phase    Esophageal reflux    Essential hypertension    Heart murmur    Hemorrhoids    Hyperlipidemia    Internal hemorrhoids without mention of complication    Left wrist fracture    PONV (postoperative nausea and vomiting)    Stricture and stenosis of esophagus    Type 2 diabetes mellitus (HCC)    Unspecified gastritis and gastroduodenitis without mention of hemorrhage     PSH: Past Surgical History:  Procedure Laterality Date   ABDOMINAL HYSTERECTOMY     BACTERIAL OVERGROWTH TEST  N/A 09/06/2015   Procedure: BACTERIAL OVERGROWTH TEST;  Surgeon: Daneil Dolin, MD;  [facility];  Service: Endoscopy;  Laterality: N/A;  800   BREAST LUMPECTOMY Right    benign   CATARACT EXTRACTION Bilateral 2006   Implants in both, surgeries done 6 weeks apart   CHOLECYSTECTOMY     COLONOSCOPY  03/08/02   [name]: diverticulosis, internal hemorrhoids, adenomatous colon polyp   COLONOSCOPY  01/23/04   [name]: diverticulosis, internal hemorrhoids   COLONOSCOPY  09/25/05   [name]: diverticulosis   COLONOSCOPY  07/05/09   [name]: severe diverticulosis   COLONOSCOPY  01/21/11   [name]: severe diverticulosis in sigmoid to desc colon, int hemorrhoids, follow up TCS in 5 years   COLONOSCOPY N/A 04/10/2016   [name]: diverticulosis   CYSTOSCOPY W/ URETERAL STENT PLACEMENT Right 03/15/2021   Procedure: CYSTOSCOPY WITH RETROGRADE PYELOGRAM/URETERAL STENT PLACEMENT;  Surgeon: Janith Lima, MD;  Location: [facility];  Service: Urology;  Laterality: Right;   CYSTOSCOPY WITH RETROGRADE PYELOGRAM, URETEROSCOPY AND STENT PLACEMENT Right 03/29/2021   Procedure: CYSTOSCOPY WITH RETROGRADE PYELOGRAM, URETEROSCOPY AND STENT EXCHANGE;  Surgeon: Cleon Gustin, MD;  Location: [facility];  Service: Urology;  Laterality: Right;   DILATION AND CURETTAGE OF UTERUS     ESOPHAGEAL MANOMETRY  03/21/08   Patterson: findings c/w [place]acker Esophagus   ESOPHAGOGASTRODUODENOSCOPY  03/08/02   [name]: esophageal stricture, chronic gerd, s/p dilation.    ESOPHAGOGASTRODUODENOSCOPY  01/23/04   [name]:  esophageal stricture, gastritis, hiatal hernia   ESOPHAGOGASTRODUODENOSCOPY  09/25/05   [name]: gastritis, benign bx, no H.pylori   ESOPHAGOGASTRODUODENOSCOPY  07/05/09   [name]: gastropathy, benign small bowel bx and gastric bx   ESOPHAGOGASTRODUODENOSCOPY N/A 05/15/2015   Dr. Gala Romney- diffuse moderate inflammation characterized by congestion (edema), erythema, and linear erosions was found in the  entire examined stomach. bx= reactive gastropathy   FOOT SURGERY Left    HEMORRHOID BANDING  2017   Dr.[name]   HOLMIUM LASER APPLICATION Right 06/25/8500   Procedure: HOLMIUM LASER APPLICATION;  Surgeon: Cleon Gustin, MD;  Location: [facility];  Service: Urology;  Laterality: Right;   LAPAROSCOPIC VAGINAL HYSTERECTOMY     ORIF WRIST FRACTURE Left 06/23/2018   Procedure: OPEN REDUCTION INTERNAL FIXATION (ORIF) LEFT WRIST FRACTURE;  Surgeon: Renette Butters, MD;  Location: [facility];  Service: Orthopedics;  Laterality: Left;  regional arm block   RECTOCELE REPAIR     TOTAL KNEE ARTHROPLASTY Right     SH: Social History   Tobacco Use   Smoking status: Never   Smokeless tobacco: Never   Tobacco comments:    Never smoked  Vaping Use   Vaping Use: Never used  Substance Use Topics   Alcohol use: No    Alcohol/week: 0.0 standard drinks of alcohol   Drug use: No    ROS: See HPI  PE: BP (!) 168/74   Pulse 98   Ht '5\' 4"'$  (1.626 m)   Wt 132 lb (59.9 kg)   BMI 22.66 kg/m  GENERAL APPEARANCE:  Well appearing, well developed, well nourished, NAD HEENT:  Atraumatic, normocephalic NECK:  Supple. Trachea midline ABDOMEN:  Soft, non-tender, no masses MENTAL STATUS:  appropriate BACK:  Non-tender to palpation, No CVAT SKIN:  Warm, dry, and intact   Results: Laboratory Data: Lab Results  Component Value Date   WBC 8.0 05/07/2021   HGB 12.9 05/07/2021   HCT 38.9 05/07/2021   MCV 87 05/07/2021   PLT 247 05/07/2021    Lab Results  Component Value Date   CREATININE 0.67 05/07/2021    Lab Results  Component Value Date   HGBA1C 6.1 (H) 03/15/2021    Urinalysis    Component Value Date/Time   COLORURINE YELLOW 03/15/2021 1740   APPEARANCEUR Hazy (A) 07/25/2021 1544   LABSPEC 1.015 03/15/2021 1740   PHURINE 5.0 03/15/2021 1740   GLUCOSEU Negative 07/25/2021 1544   HGBUR NEGATIVE 03/15/2021 1740   BILIRUBINUR Negative 07/25/2021 [place] 03/15/2021 1740   PROTEINUR Negative 07/25/2021 1544   PROTEINUR 30 (A) 03/15/2021 1740   UROBILINOGEN 0.2 08/18/2019 1326   UROBILINOGEN 0.2 04/08/2008 1454   NITRITE Negative 07/25/2021 1544   NITRITE NEGATIVE 03/15/2021 1740   LEUKOCYTESUR Trace (A) 07/25/2021 1544   LEUKOCYTESUR LARGE (A) 03/15/2021 1740    Lab Results  Component Value Date   LABMICR See below: 07/25/2021   WBCUA 0-5 07/25/2021   LABEPIT 0-10 07/25/2021   MUCUS Present 07/25/2021   BACTERIA Few 07/25/2021    Pertinent Imaging:  No results found for this or any previous visit.  No results found for this or any previous visit.  No results found for this or any previous visit.  No results found for this or any previous visit.  Results for orders placed during the hospital encounter of 07/11/21  US RENAL  Narrative CLINICAL DATA:  History of nephrolithiasis.  Follow-up exam.  EXAM: RENAL / URINARY TRACT ULTRASOUND COMPLETE  COMPARISON:  Ultrasound  renal 04/11/2021  FINDINGS: Right Kidney:  Renal measurements: 12.5 x 5.3 x 4.8 cm = volume: 166.2 mL. Echogenicity within normal limits. No mass or hydronephrosis visualized. Superior pole cyst measuring up to 3.5 cm.  Left Kidney:  Renal measurements: 11.3 x 5.7 x 5.0 cm = volume: 169.3 mL. Echogenicity within normal limits. No mass or hydronephrosis visualized. There are multiple cysts measuring up to 3 cm within the superior pole. There are at least 2 echogenic shadowing foci favored to represent stones measuring up to 5 mm.  Bladder:  Appears normal for degree of bladder distention.  Other:  None.  IMPRESSION: No hydronephrosis.  Probable left nephrolithiasis.   Electronically Signed By: Lovey Newcomer M.D. On: 07/12/2021 08:42  No results found for this or any previous visit.  No results found for this or any previous visit.  No results found for this or any previous visit.  No results found for this or any  previous visit (from the past 24 hour(s)).

## 2021-07-31 DIAGNOSIS — E1142 Type 2 diabetes mellitus with diabetic polyneuropathy: Secondary | ICD-10-CM | POA: Diagnosis not present

## 2021-07-31 DIAGNOSIS — B351 Tinea unguium: Secondary | ICD-10-CM | POA: Diagnosis not present

## 2021-07-31 DIAGNOSIS — L84 Corns and callosities: Secondary | ICD-10-CM | POA: Diagnosis not present

## 2021-08-14 DIAGNOSIS — H34832 Tributary (branch) retinal vein occlusion, left eye, with macular edema: Secondary | ICD-10-CM | POA: Diagnosis not present

## 2021-08-20 ENCOUNTER — Other Ambulatory Visit: Payer: Self-pay | Admitting: Gastroenterology

## 2021-08-29 ENCOUNTER — Ambulatory Visit: Payer: Medicare Other | Admitting: Urology

## 2021-08-29 ENCOUNTER — Ambulatory Visit (INDEPENDENT_AMBULATORY_CARE_PROVIDER_SITE_OTHER): Payer: Medicare Other | Admitting: Gastroenterology

## 2021-08-29 ENCOUNTER — Ambulatory Visit (INDEPENDENT_AMBULATORY_CARE_PROVIDER_SITE_OTHER): Payer: Medicare Other | Admitting: Physician Assistant

## 2021-08-29 ENCOUNTER — Encounter: Payer: Self-pay | Admitting: Gastroenterology

## 2021-08-29 VITALS — BP 159/76 | HR 77 | Temp 97.7°F | Resp 18

## 2021-08-29 VITALS — BP 158/68 | HR 97 | Temp 97.7°F | Ht 64.0 in | Wt 137.4 lb

## 2021-08-29 DIAGNOSIS — Z87440 Personal history of urinary (tract) infections: Secondary | ICD-10-CM

## 2021-08-29 DIAGNOSIS — R197 Diarrhea, unspecified: Secondary | ICD-10-CM | POA: Diagnosis not present

## 2021-08-29 DIAGNOSIS — K219 Gastro-esophageal reflux disease without esophagitis: Secondary | ICD-10-CM | POA: Diagnosis not present

## 2021-08-29 DIAGNOSIS — R300 Dysuria: Secondary | ICD-10-CM | POA: Diagnosis not present

## 2021-08-29 DIAGNOSIS — N3001 Acute cystitis with hematuria: Secondary | ICD-10-CM | POA: Diagnosis not present

## 2021-08-29 LAB — URINALYSIS, ROUTINE W REFLEX MICROSCOPIC
Bilirubin, UA: NEGATIVE
Glucose, UA: NEGATIVE
Ketones, UA: NEGATIVE
Nitrite, UA: POSITIVE — AB
Specific Gravity, UA: 1.02 (ref 1.005–1.030)
Urobilinogen, Ur: 0.2 mg/dL (ref 0.2–1.0)
pH, UA: 5.5 (ref 5.0–7.5)

## 2021-08-29 LAB — MICROSCOPIC EXAMINATION
Epithelial Cells (non renal): NONE SEEN /hpf (ref 0–10)
Renal Epithel, UA: NONE SEEN /hpf
WBC, UA: >30 /hpf — AB (ref 0–5)

## 2021-08-29 MED ORDER — FAMOTIDINE 20 MG PO TABS: ORAL_TABLET | ORAL | 5 refills | Status: DC

## 2021-08-29 MED ORDER — NITROFURANTOIN MONOHYD MACRO 100 MG PO CAPS: 100.0000 mg | ORAL_CAPSULE | Freq: Two times a day (BID) | ORAL | 0 refills | Status: AC

## 2021-08-29 NOTE — Progress Notes (Unsigned)
Assessment: 1. Dysuria - Urinalysis, Routine w reflex microscopic - Urine Culture  2. Acute cystitis with hematuria - Urine Culture  3. History of recurrent UTIs - Urine Culture    Plan: Macrobid sent to pharmacy and cx ordered. Will adjust therapy if indicated by results. She will resume methenamine twice daily after completion of antibiotic course.  Keep scheduled follow-up as previously planned.  Continue DMSO installations after current UTI treated.  Chief Complaint: No chief complaint on file.   HPI: Deborah Gray is a 88 y.o. female who presents for continued evaluation of recurrent UTIs.  She reports a 5-day history of vague increase in frequency and urgency with a 1 day history of burning, frequency, significant urgency.  No fever, chills.  She has had some nausea but no vomiting.  No abdominal pain.  She does elicit low back discomfort.  Patient remains on methenamine twice daily. UA= greater than 30 WBCs, 11-30 RBCs, many bacteria, nitrite positive  07/25/21 Deborah Gray is a 88 y.o. female who presents for continued evaluation of IC with scheduled DMSO.  She recently had to delay treatment due to UTI symptoms.  Culture on 07/18/2021 grew greater than 100,000 colonies of E. coli.  Sensitive to [place] which she has completed.  UTI symptoms have resolved.   UA = 0-5 WBCs, few bacteria, nitrite negative   Portions of the above documentation were copied from a prior visit for review purposes only.  Allergies: Allergies  Allergen Reactions   Adhesive [Tape] Other (See Comments)    Reaction:  Blisters    Estrogens Other (See Comments)    Reaction:  Migraines    Sulfonamide Derivatives Rash    PMH: Past Medical History:  Diagnosis Date   Arthritis    Benign neoplasm of colon    Constipation    Diaphragmatic hernia without mention of obstruction or gangrene    Diverticulosis of colon (without mention of hemorrhage)    Dysphagia, pharyngoesophageal  phase    Esophageal reflux    Essential hypertension    Heart murmur    Hemorrhoids    Hyperlipidemia    Internal hemorrhoids without mention of complication    Left wrist fracture    PONV (postoperative nausea and vomiting)    Stricture and stenosis of esophagus    Type 2 diabetes mellitus (HCC)    Unspecified gastritis and gastroduodenitis without mention of hemorrhage     PSH: Past Surgical History:  Procedure Laterality Date   ABDOMINAL HYSTERECTOMY     BACTERIAL OVERGROWTH TEST N/A 09/06/2015   Procedure: BACTERIAL OVERGROWTH TEST;  Surgeon: Daneil Dolin, MD;  [facility];  Service: Endoscopy;  Laterality: N/A;  800   BREAST LUMPECTOMY Right    benign   CATARACT EXTRACTION Bilateral 2006   Implants in both, surgeries done 6 weeks apart   CHOLECYSTECTOMY     COLONOSCOPY  03/08/02   [name]: diverticulosis, internal hemorrhoids, adenomatous colon polyp   COLONOSCOPY  01/23/04   [name]: diverticulosis, internal hemorrhoids   COLONOSCOPY  09/25/05   [name]: diverticulosis   COLONOSCOPY  07/05/09   [name]: severe diverticulosis   COLONOSCOPY  01/21/11   [name]: severe diverticulosis in sigmoid to desc colon, int hemorrhoids, follow up TCS in 5 years   COLONOSCOPY N/A 04/10/2016   [name]: diverticulosis   CYSTOSCOPY W/ URETERAL STENT PLACEMENT Right 03/15/2021   Procedure: CYSTOSCOPY WITH RETROGRADE PYELOGRAM/URETERAL STENT PLACEMENT;  Surgeon: Janith Lima, MD;  [facility];  Service: Urology;  Laterality: Right;   CYSTOSCOPY WITH RETROGRADE PYELOGRAM, URETEROSCOPY AND STENT PLACEMENT Right 03/29/2021   Procedure: CYSTOSCOPY WITH RETROGRADE PYELOGRAM, URETEROSCOPY AND STENT EXCHANGE;  Surgeon: Cleon Gustin, MD;  Location: [facility];  Service: Urology;  Laterality: Right;   DILATION AND CURETTAGE OF UTERUS     ESOPHAGEAL MANOMETRY  03/21/08   [place]terson: findings c/w [place] Esophagus   ESOPHAGOGASTRODUODENOSCOPY  03/08/02   [name]: esophageal  stricture, chronic gerd, s/p dilation.    ESOPHAGOGASTRODUODENOSCOPY  01/23/04   [name]: esophageal stricture, gastritis, hiatal hernia   ESOPHAGOGASTRODUODENOSCOPY  09/25/05   [name]: gastritis, benign bx, no H.pylori   ESOPHAGOGASTRODUODENOSCOPY  07/05/09   [name]: gastropathy, benign small bowel bx and gastric bx   ESOPHAGOGASTRODUODENOSCOPY N/A 05/15/2015   Dr. Gala Romney- diffuse moderate inflammation characterized by congestion (edema), erythema, and linear erosions was found in the entire examined stomach. bx= reactive gastropathy   FOOT SURGERY Left    HEMORRHOID BANDING  2017   Dr.[name]   HOLMIUM LASER APPLICATION Right 03/24/2681   Procedure: HOLMIUM LASER APPLICATION;  Surgeon: Cleon Gustin, MD;  [facility];  Service: Urology;  Laterality: Right;   LAPAROSCOPIC VAGINAL HYSTERECTOMY     ORIF WRIST FRACTURE Left 06/23/2018   Procedure: OPEN REDUCTION INTERNAL FIXATION (ORIF) LEFT WRIST FRACTURE;  Surgeon: Renette Butters, MD;  Location: [facility];  Service: Orthopedics;  Laterality: Left;  regional arm block   RECTOCELE REPAIR     TOTAL KNEE ARTHROPLASTY Right     SH: Social History   Tobacco Use   Smoking status: Never   Smokeless tobacco: Never   Tobacco comments:    Never smoked  Vaping Use   Vaping Use: Never used  Substance Use Topics   Alcohol use: No    Alcohol/week: 0.0 standard drinks of alcohol   Drug use: No    ROS: All other review of systems were reviewed and are negative except what is noted above in HPI  PE: BP (!) 159/76   Pulse 77   Temp 97.7 F (36.5 C)   Resp 18  GENERAL APPEARANCE:  Well appearing, well developed, well nourished, NAD HEENT:  Atraumatic, normocephalic NECK:  Supple. Trachea midline ABDOMEN:  Soft, non-tender, no masses EXTREMITIES:  Moves all extremities well NEUROLOGIC:  Alert and oriented x 3 MENTAL STATUS:  appropriate BACK:  Non-tender to palpation, No CVAT SKIN:  Warm, dry, and  intact   Results: Laboratory Data: Lab Results  Component Value Date   WBC 8.0 05/07/2021   HGB 12.9 05/07/2021   HCT 38.9 05/07/2021   MCV 87 05/07/2021   PLT 247 05/07/2021    Lab Results  Component Value Date   CREATININE 0.67 05/07/2021    No results found for: "PSA"  No results found for: "TESTOSTERONE"  Lab Results  Component Value Date   HGBA1C 6.1 (H) 03/15/2021    Urinalysis    Component Value Date/Time   COLORURINE YELLOW 03/15/2021 1740   APPEARANCEUR Cloudy (A) 08/29/2021 1153   LABSPEC 1.015 03/15/2021 1740   PHURINE 5.0 03/15/2021 1740   GLUCOSEU Negative 08/29/2021 1153   HGBUR NEGATIVE 03/15/2021 1740   BILIRUBINUR Negative 08/29/2021 1153   KETONESUR NEGATIVE 03/15/2021 1740   PROTEINUR 2+ (A) 08/29/2021 1153   PROTEINUR 30 (A) 03/15/2021 1740   UROBILINOGEN 0.2 08/18/2019 1326   UROBILINOGEN 0.2 04/08/2008 1454   NITRITE Positive (A) 08/29/2021 1153   NITRITE NEGATIVE 03/15/2021 1740   LEUKOCYTESUR 3+ (A) 08/29/2021 1153   LEUKOCYTESUR LARGE (  A) 03/15/2021 1740    Lab Results  Component Value Date   LABMICR See below: 08/29/2021   WBCUA >30 (A) 08/29/2021   LABEPIT None seen 08/29/2021   MUCUS Present 08/29/2021   BACTERIA Many (A) 08/29/2021    Pertinent Imaging: No results found for this or any previous visit.  No results found for this or any previous visit.  No results found for this or any previous visit.  No results found for this or any previous visit.  Results for orders placed during the hospital encounter of 07/11/21  US RENAL  Narrative CLINICAL DATA:  History of nephrolithiasis.  Follow-up exam.  EXAM: RENAL / URINARY TRACT ULTRASOUND COMPLETE  COMPARISON:  Ultrasound renal 04/11/2021  FINDINGS: Right Kidney:  Renal measurements: 12.5 x 5.3 x 4.8 cm = volume: 166.2 mL. Echogenicity within normal limits. No mass or hydronephrosis visualized. Superior pole cyst measuring up to 3.5 cm.  Left  Kidney:  Renal measurements: 11.3 x 5.7 x 5.0 cm = volume: 169.3 mL. Echogenicity within normal limits. No mass or hydronephrosis visualized. There are multiple cysts measuring up to 3 cm within the superior pole. There are at least 2 echogenic shadowing foci favored to represent stones measuring up to 5 mm.  Bladder:  Appears normal for degree of bladder distention.  Other:  None.  IMPRESSION: No hydronephrosis.  Probable left nephrolithiasis.   Electronically Signed By: Lovey Newcomer M.D. On: 07/12/2021 08:42  No results found for this or any previous visit.  No results found for this or any previous visit.  No results found for this or any previous visit.  Results for orders placed or performed in visit on 08/29/21 (from the past 24 hour(s))  Microscopic Examination   Collection Time: 08/29/21 11:53 AM   Urine  Result Value Ref Range   WBC, UA >30 (A) 0 - 5 /hpf   RBC, Urine 11-30 (A) 0 - 2 /hpf   Epithelial Cells (non renal) None seen 0 - 10 /hpf   Renal Epithel, UA None seen None seen /hpf   Mucus, UA Present Not Estab.   Bacteria, UA Many (A) None seen/Few  Urinalysis, Routine w reflex microscopic   Collection Time: 08/29/21 11:53 AM  Result Value Ref Range   Specific Gravity, UA 1.020 1.005 - 1.030   pH, UA 5.5 5.0 - 7.5   Color, UA Amber (A) Yellow   Appearance Ur Cloudy (A) Clear   Leukocytes,UA 3+ (A) Negative   Protein,UA 2+ (A) Negative/Trace   Glucose, UA Negative Negative   Ketones, UA Negative Negative   RBC, UA 3+ (A) Negative   Bilirubin, UA Negative Negative   Urobilinogen, Ur 0.2 0.2 - 1.0 mg/dL   Nitrite, UA Positive (A) Negative   Microscopic Examination See below:

## 2021-08-29 NOTE — Progress Notes (Signed)
GI Office Note    Referring Provider: Coral Spikes, DO Primary Care Physician:  Coral Spikes, DO  Primary Gastroenterologist: Garfield Cornea, MD  Chief Complaint   Chief Complaint  Patient presents with   Diarrhea    History of Present Illness   Deborah Gray is a 88 y.o. female presenting today for further evaluation of diarrhea.  She was last seen in May.  Longstanding history of GERD, intermittent diarrhea, bloating, hemorrhoids, idiopathic chronic pancreatitis with pancreatic exocrine insufficiency, diarrhea, nutcracker's esophagus.  Since her last office visit, she has had a couple of mild episodes of abdominal cramping, was able to stop with Levsin.  July 1 she woke up with several bouts of diarrhea thought it might be something she ate.  She had significant diarrhea and abdominal cramping July 5, Levsin did not stop her symptoms.  She ultimately ended up taking 2 Imodium's because she had plans with family to celebrate her birthday.  In retrospect she believes some of her diarrhea and on the fifth was related to what she had ate for the holidays.  She had more starches than she normally did and also ate 2 hotdogs.  Unfortunately when she has bouts of diarrhea she tends to get a UTI.  Yesterday she started having urinary frequency, she went by her urologist office today and was diagnosed with a UTI, culture pending.  Started on antibiotics.  She is having increased issues with burning in her chest.  Some mild sensation of food sitting in her throat area.  Since her last visit she has been taking Pepcid 40 mg every morning but found it difficult to take a half a tablet at bedtime because the tablet is so small she has trouble cutting it.  She prefer to avoid PPI therapy because of concern for causing memory issues.  Over the last couple of days her bowel movements have returned to her normal.  Stools are firmer.  No melena or rectal bleeding.  Please see my detailed note from  Jul 11, 2021 for pertinent past history.  Medications   Current Outpatient Medications  Medication Sig Dispense Refill   acetaminophen (TYLENOL) 500 MG tablet Take 500 mg by mouth at bedtime.     aspirin EC 81 MG tablet Take 81 mg by mouth at bedtime.     Azelastine-Fluticasone 137-50 MCG/ACT SUSP Place 1 spray into the nose every 12 (twelve) hours. 23 g 0   blood glucose meter kit and supplies KIT Dispense based on  insurance preference. Tests once a day E11.9 1 each 0   Carboxymethylcellulose Sodium (THERATEARS) 0.25 % SOLN Place 1 drop into both eyes 2 (two) times daily.     cholecalciferol (VITAMIN D3) 25 MCG (1000 UNIT) tablet Take 1,000 Units by mouth daily.     ezetimibe (ZETIA) 10 MG tablet TAKE 1 TABLET BY MOUTH EVERY DAY 90 tablet 1   famotidine (PEPCID) 40 MG tablet Take 62m once daily OR 1/2 tablet twice daily. 30 tablet 5   hyoscyamine (LEVSIN SL) 0.125 MG SL tablet Take 1 tablet (0.125 mg total) by mouth daily as needed for cramping. 90 tablet 2   ketoconazole (NIZORAL) 2 % cream Apply 1 application topically daily as needed for irritation (to affected area. external use only). (Patient taking differently: Apply 1 application  topically daily.) 30 g 0   losartan (COZAAR) 50 MG tablet Take 1 tablet (50 mg total) by mouth daily. 90 tablet 3   meclizine (ANTIVERT)  25 MG tablet TAKE 1 TABLET BY MOUTH 3 TIMES A DAY AS NEEDED (Patient taking differently: Take 25 mg by mouth daily as needed for dizziness.) 30 tablet 1   metFORMIN (GLUCOPHAGE) 500 MG tablet TAKE ONE TABLET BY MOUTH EVERY MORNING ONE AT LUNCH AND 2 AT BEDTIME 360 tablet 1   methenamine (HIPREX) 1 g tablet TAKE 1 TABLET BY MOUTH 2 TIMES DAILY. 180 tablet 3   mirabegron ER (MYRBETRIQ) 50 MG TB24 tablet Take 1 tablet (50 mg total) by mouth daily. 90 tablet 3   Multiple Vitamins-Minerals (PRESERVISION AREDS 2) CAPS Take 1 capsule by mouth 2 (two) times daily.      nateglinide (STARLIX) 120 MG tablet Take 1 tablet (120 mg  total) by mouth 3 (three) times daily with meals. 270 tablet 1   nitrofurantoin, macrocrystal-monohydrate, (MACROBID) 100 MG capsule Take 1 capsule (100 mg total) by mouth 2 (two) times daily for 7 days. 14 capsule 0   omega-3 acid ethyl esters (LOVAZA) 1 g capsule Take 2 g by mouth daily.     Pancrelipase, Lip-Prot-Amyl, (CREON) 24000-76000 units CPEP TAKE 3 CAPSULES THREE TIMES DAILY WITH MEALS AND ONE CAPSULE WITH SNACK TWICE DAILY 1000 capsule 5   traMADol (ULTRAM) 50 MG tablet Take 1 tablet (50 mg total) by mouth every 6 (six) hours as needed. 30 tablet 0   Vitamin Mixture (ESTER-C) 500-60 MG TABS Take 1 tablet by mouth daily.      No current facility-administered medications for this visit.    Allergies   Allergies as of 08/29/2021 - Review Complete 08/29/2021  Allergen Reaction Noted   Adhesive [tape] Other (See Comments) 07/21/2016   Estrogens Other (See Comments) 10/15/2012   Sulfonamide derivatives Rash 03/01/2008      Review of Systems   General: Negative for anorexia, weight loss, fever, chills, fatigue, weakness. ENT: Negative for hoarseness,  nasal congestion.  See HPI CV: Negative for chest pain, angina, palpitations, dyspnea on exertion, peripheral edema.  Respiratory: Negative for dyspnea at rest, dyspnea on exertion, cough, sputum, wheezing.  GI: See history of present illness. GU:  Negative for dysuria, hematuria, urinary incontinence, urinary frequency, nocturnal urination.  Endo: Negative for unusual weight change.     Physical Exam   BP (!) 158/68 (BP Location: Left Arm, Patient Position: Sitting, Cuff Size: Normal)   Pulse 97   Temp 97.7 F (36.5 C) (Temporal)   Ht _0  (1.626 m)   Wt 137 lb 6.4 oz (62.3 kg)   SpO2 97%   BMI 23.58 kg/m    General: Well-nourished, well-developed in no acute distress.  Eyes: No icterus. Mouth: Oropharyngeal mucosa moist and pink , no lesions erythema or exudate. Lungs: Clear to auscultation bilaterally.  Heart:  Regular rate and rhythm, no murmurs rubs or gallops.  Abdomen: Bowel sounds are normal, nontender, nondistended, no hepatosplenomegaly or masses,  no abdominal bruits or hernia , no rebound or guarding.  Rectal: Not performed Extremities: No lower extremity edema. No clubbing or deformities. Neuro: Alert and oriented x 4   Skin: Warm and dry, no jaundice.   Psych: Alert and cooperative, normal mood and affect.  Labs   Lab Results  Component Value Date   CREATININE 0.67 05/07/2021   BUN 33 (H) 05/07/2021   NA 140 05/07/2021   K 5.0 05/07/2021   CL 102 05/07/2021   CO2 25 05/07/2021   Lab Results  Component Value Date   WBC 8.0 05/07/2021   HGB 12.9 05/07/2021  HCT 38.9 05/07/2021   MCV 87 05/07/2021   PLT 247 05/07/2021   Lab Results  Component Value Date   ALT 15 05/07/2021   AST 16 05/07/2021   ALKPHOS 77 05/07/2021   BILITOT 0.4 05/07/2021    Imaging Studies   No results found.  Assessment   88 year old lady with multiple GI issues including history of nutcracker esophagus, idiopathic chronic pancreatitis with chronic pancreatic exocrine insufficiency, IBS, hemorrhoids, GERD presenting for urgent follow-up.  Since her last visit she had done well with regards to abdominal cramping and loose urgent stool up until last week.  She believes her symptoms were exacerbated by increased portions of foods that she typically tries to avoid because of GI symptoms.  Fortunately she is feeling better at this point.  Levsin seems to control her mild abdominal cramping/loose stools episodes but when she has a severe episode, she utilizes Imodium with relief.  She continues to take Creon probiotics daily.  I suspect her recent flare of diarrhea have been related to IBS, dietary changes more than anything.  She did have stool studies back in May when she presented with similar symptoms, findings unremarkable.  She also has had CT abdomen pelvis earlier this year with findings of  obstructing kidney stone which she has completed treatment with urology.  She has recurrent UTIs requiring repeated antibiotics.  If she has persisting diarrhea, would consider checking for C. difficile again.  Continues to have significant heartburn at times.  She prefers to avoid PPI therapy for concerns regarding potential side effects.  Would likely do better if she is able to take famotidine twice daily, will reduce her dose so that she can dose twice daily.  Rarely needs Librax for esophageal spasms.  Last EGD in 2017, no indication for repeat EGD at this time but if we are unable to control her symptoms with change in medical therapy, would revisit the possibility.    PLAN   Famotidine 20 mg before breakfast and 20 mg before evening meal. Continue Librax as needed for esophageal spasms. Continue Levsin for abdominal cramping/loose stools.  At onset of symptoms take 1.  May increase up to 3 times per day if needed. Continue Creon 3 capsules with meals and 2 with snacks. Continue probiotic daily. Current office visit in 3 months but she will let me know if her symptoms do not settle down over the next couple of weeks.  Laureen Ochs. Bobby Rumpf, [place], [facility]

## 2021-08-29 NOTE — Patient Instructions (Addendum)
Change famotidine to '20mg'$  twice daily, once before breakfast and once before evening meal. New RX sent to [facility].  Continue Librax as needed for esophageal spasms. Continue Levsin for abdominal cramping and loose stools. At onset of symptoms, go ahead and take one Levsin to see if you can stop the process. On bad days, you can take up to 3 if needed. May cause dry mouth if using regularly.  Continue Creon 3 capsules with meals and 2 with snacks. Continue probiotic daily.  Keep me posted regarding your esophageal burning and diarrhea especially if they do not settle down.  Return office visit in 3 months.

## 2021-08-30 ENCOUNTER — Ambulatory Visit: Payer: Medicare Other | Admitting: Physician Assistant

## 2021-08-30 ENCOUNTER — Encounter: Payer: Self-pay | Admitting: Physician Assistant

## 2021-09-03 ENCOUNTER — Ambulatory Visit (INDEPENDENT_AMBULATORY_CARE_PROVIDER_SITE_OTHER): Payer: Medicare Other | Admitting: Urology

## 2021-09-03 VITALS — BP 112/64 | HR 84

## 2021-09-03 DIAGNOSIS — N3010 Interstitial cystitis (chronic) without hematuria: Secondary | ICD-10-CM

## 2021-09-03 LAB — URINALYSIS, ROUTINE W REFLEX MICROSCOPIC
Bilirubin, UA: NEGATIVE
Glucose, UA: NEGATIVE
Leukocytes,UA: NEGATIVE
Nitrite, UA: NEGATIVE
RBC, UA: NEGATIVE
Specific Gravity, UA: 1.02 (ref 1.005–1.030)
Urobilinogen, Ur: 0.2 mg/dL (ref 0.2–1.0)
pH, UA: 5.5 (ref 5.0–7.5)

## 2021-09-03 LAB — URINE CULTURE

## 2021-09-03 MED ORDER — DIMETHYL SULFOXIDE 50 % IS SOLN
50.0000 mL | Freq: Once | INTRAVESICAL | Status: AC
  Administered 2021-09-03: 50 mL via URETHRAL

## 2021-09-03 MED ORDER — SODIUM BICARBONATE 8.4 % IV SOLN
50.0000 meq | Freq: Once | INTRAVENOUS | Status: AC
  Administered 2021-09-03: 50 meq

## 2021-09-03 MED ORDER — HYDROCORTISONE SOD SUC (PF) 100 MG IJ SOLR
100.0000 mg | Freq: Once | INTRAMUSCULAR | Status: AC
  Administered 2021-09-03: 100 mg

## 2021-09-03 MED ORDER — LIDOCAINE HCL 2 % IJ SOLN
10.0000 mL | Freq: Once | INTRAMUSCULAR | Status: AC
  Administered 2021-09-03: 200 mg

## 2021-09-03 NOTE — Progress Notes (Signed)
Bladder Instillation DMSO  Due to IC patient is present today for a Bladder Instillation of DMSO treatment. Patient was cleaned and prepped in a sterile fashion with betadine.  A 18 FR indwelling catheter was inserted, urine return was noted 22m, urine was yellow in color.  DMSO treatment was instilled into the bladder. The catheter was then removed. Patient tolerated well, no complications were noted treatment was held in bladder for 20 minutes, bladder was drained from treatment without difficulty and catheter remove without difficulty.  Preformed by: SMarisue Brooklyn CMA, Assisted by AEstill Bamberg RN  Follow up/ Additional notes: Keep schedule appt.

## 2021-09-05 ENCOUNTER — Telehealth: Payer: Self-pay | Admitting: Physician Assistant

## 2021-09-05 NOTE — Telephone Encounter (Signed)
Cx results discussed and pt has resumed Methenamine after completing Rx for Macrobid

## 2021-09-07 ENCOUNTER — Other Ambulatory Visit: Payer: Self-pay | Admitting: Family Medicine

## 2021-09-07 DIAGNOSIS — E1100 Type 2 diabetes mellitus with hyperosmolarity without nonketotic hyperglycemic-hyperosmolar coma (NKHHC): Secondary | ICD-10-CM

## 2021-09-09 ENCOUNTER — Ambulatory Visit
Admission: EM | Admit: 2021-09-09 | Discharge: 2021-09-09 | Disposition: A | Discharge status: Home / Self Care | Payer: Medicare Other | Attending: Nurse Practitioner | Admitting: Nurse Practitioner

## 2021-09-09 ENCOUNTER — Encounter: Payer: Self-pay | Admitting: Emergency Medicine

## 2021-09-09 ENCOUNTER — Ambulatory Visit (INDEPENDENT_AMBULATORY_CARE_PROVIDER_SITE_OTHER): Payer: Medicare Other

## 2021-09-09 DIAGNOSIS — M545 Low back pain, unspecified: Secondary | ICD-10-CM

## 2021-09-09 DIAGNOSIS — M419 Scoliosis, unspecified: Secondary | ICD-10-CM

## 2021-09-09 DIAGNOSIS — R350 Frequency of micturition: Secondary | ICD-10-CM | POA: Diagnosis not present

## 2021-09-09 LAB — POCT URINALYSIS DIP (MANUAL ENTRY)
Bilirubin, UA: NEGATIVE
Blood, UA: NEGATIVE
Glucose, UA: NEGATIVE mg/dL
Ketones, POC UA: NEGATIVE mg/dL
Nitrite, UA: NEGATIVE
Protein Ur, POC: NEGATIVE mg/dL
Spec Grav, UA: 1.015 (ref 1.010–1.025)
Urobilinogen, UA: 0.2 E.U./dL
pH, UA: 5.5 (ref 5.0–8.0)

## 2021-09-09 MED ORDER — TRAMADOL HCL 50 MG PO TABS: 50.0000 mg | ORAL_TABLET | Freq: Two times a day (BID) | ORAL | 0 refills | Status: DC | PRN

## 2021-09-09 MED ORDER — ACETAMINOPHEN ER 650 MG PO TBCR: 650.0000 mg | EXTENDED_RELEASE_TABLET | Freq: Three times a day (TID) | ORAL | 0 refills | Status: DC | PRN

## 2021-09-09 NOTE — ED Triage Notes (Signed)
Lower back pain and pain in right upper leg since Thursday.  Has been taking tylenol for pain with some relief.  States she has had urinary frequency.

## 2021-09-09 NOTE — ED Provider Notes (Signed)
[facility]    CSN: [id number] Arrival date & time: 09/09/21  1334      History   Chief Complaint No chief complaint on file.   HPI [place] is a 88 y.o. female.   The history is provided by the patient.   Patient presents for complaints of chronic low back pain.  Patient states symptoms worsened over the past several days.  She also complains of pain in the right thigh.  She states that she did have a fall in 2020 and has had chronic back issues since that time.  She states that she has been prescribed tramadol in the past by Dr. Gerda Diss and has also had steroid injections in the past years ago.  She complains of urinary frequency, but she also has a history of interstitial cystitis.  She is currently under the care of urology.  Patient states she just completed a course of antibiotics as she had a urinary tract infection over the the last week.  She denies numbness, tingling, radiation of pain, loss of bowel or bladder function.  States she has been taking Tylenol for her symptoms with minimal relief.  Patient also states that she has been going to see the chiropractor for readjustment of her back.  She states she missed a recent appointment and got off track with her usual schedule.  She states that she usually has improvement of her pain after seeing the chiropractor, but after this last visit, her symptoms appear to have worsened. Past Medical History:  Diagnosis Date   Arthritis    Benign neoplasm of colon    Constipation    Diaphragmatic hernia without mention of obstruction or gangrene    Diverticulosis of colon (without mention of hemorrhage)    Dysphagia, pharyngoesophageal phase    Esophageal reflux    Essential hypertension    Heart murmur    Hemorrhoids    Hyperlipidemia    Internal hemorrhoids without mention of complication    Left wrist fracture    PONV (postoperative nausea and vomiting)    Stricture and stenosis of esophagus    Type 2  diabetes mellitus (HCC)    Unspecified gastritis and gastroduodenitis without mention of hemorrhage     Patient Active Problem List   Diagnosis Date Noted   Early satiety 07/11/2021   Night sweats 05/07/2021   Nephrolithiasis 03/15/2021   Prolapsed internal hemorrhoids, grade 3 01/23/2021   IBS (irritable bowel syndrome) 01/30/2015   Type 2 diabetes mellitus (HCC) 12/22/2014   Hyperlipidemia, unspecified 12/22/2014   Essential hypertension 11/25/2013   Osteopenia 10/23/2012   Chronic pancreatitis (HCC) 02/26/2011   GERD (gastroesophageal reflux disease) 01/18/2011   Esophageal spasm 11/29/2010   DJD (degenerative joint disease) 11/29/2010   Diarrhea 06/29/2009   Hx of adenomatous colonic polyps 06/29/2009    Past Surgical History:  Procedure Laterality Date   ABDOMINAL HYSTERECTOMY     BACTERIAL OVERGROWTH TEST N/A 09/06/2015   Procedure: BACTERIAL OVERGROWTH TEST;  Surgeon: Corbin Ade, MD;  [facility]: [facility];  Service: Endoscopy;  Laterality: N/A;  800   BREAST LUMPECTOMY Right    benign   CATARACT EXTRACTION Bilateral 2006   Implants in both, surgeries done 6 weeks apart   CHOLECYSTECTOMY     COLONOSCOPY  03/08/02   Jarold Motto: diverticulosis, internal hemorrhoids, adenomatous colon polyp   COLONOSCOPY  01/23/04   Jarold Motto: diverticulosis, internal hemorrhoids   COLONOSCOPY  09/25/05   Patterson: diverticulosis   COLONOSCOPY  07/05/09  Patterson: severe diverticulosis   COLONOSCOPY  01/21/11   [name]: severe diverticulosis in sigmoid to desc colon, int hemorrhoids, follow up TCS in 5 years   COLONOSCOPY N/A 04/10/2016   [name]: diverticulosis   CYSTOSCOPY W/ URETERAL STENT PLACEMENT Right 03/15/2021   Procedure: CYSTOSCOPY WITH RETROGRADE PYELOGRAM/URETERAL STENT PLACEMENT;  Surgeon: Janith Lima, MD;  Location: [facility];  Service: Urology;  Laterality: Right;   CYSTOSCOPY WITH RETROGRADE PYELOGRAM, URETEROSCOPY AND STENT PLACEMENT Right 03/29/2021   Procedure:  CYSTOSCOPY WITH RETROGRADE PYELOGRAM, URETEROSCOPY AND STENT EXCHANGE;  Surgeon: Cleon Gustin, MD;  Location: [facility];  Service: Urology;  Laterality: Right;   DILATION AND CURETTAGE OF UTERUS     ESOPHAGEAL MANOMETRY  03/21/08   Patterson: findings c/w [place] Esophagus   ESOPHAGOGASTRODUODENOSCOPY  03/08/02   [name]: esophageal stricture, chronic gerd, s/p dilation.    ESOPHAGOGASTRODUODENOSCOPY  01/23/04   [name]: esophageal stricture, gastritis, hiatal hernia   ESOPHAGOGASTRODUODENOSCOPY  09/25/05   [name]: gastritis, benign bx, no H.pylori   ESOPHAGOGASTRODUODENOSCOPY  07/05/09   [name]: gastropathy, benign small bowel bx and gastric bx   ESOPHAGOGASTRODUODENOSCOPY N/A 05/15/2015   Dr. Gala Romney- diffuse moderate inflammation characterized by congestion (edema), erythema, and linear erosions was found in the entire examined stomach. bx= reactive gastropathy   FOOT SURGERY Left    HEMORRHOID BANDING  2017   Dr.[name]   HOLMIUM LASER APPLICATION Right 03/25/537   Procedure: HOLMIUM LASER APPLICATION;  Surgeon: Cleon Gustin, MD;  Location: [facility];  Service: Urology;  Laterality: Right;   LAPAROSCOPIC VAGINAL HYSTERECTOMY     ORIF WRIST FRACTURE Left 06/23/2018   Procedure: OPEN REDUCTION INTERNAL FIXATION (ORIF) LEFT WRIST FRACTURE;  Surgeon: Renette Butters, MD;  Location: [facility];  Service: Orthopedics;  Laterality: Left;  regional arm block   RECTOCELE REPAIR     TOTAL KNEE ARTHROPLASTY Right     OB History     Gravida  4   Para  3   Term  3   Preterm      AB  1   Living  3      SAB  1   IAB      Ectopic      Multiple      Live Births               Home Medications    Prior to Admission medications   Medication Sig Start Date End Date Taking? Authorizing Provider  acetaminophen (TYLENOL 8 HOUR) 650 MG CR tablet Take 1 tablet (650 mg total) by mouth every 8 (eight) hours as needed for pain. 09/09/21  Yes Albie Arizpe-Warren,  Alda Lea, NP  traMADol (ULTRAM) 50 MG tablet Take 1 tablet (50 mg total) by mouth every 12 (twelve) hours as needed for severe pain. 09/09/21  Yes Jacquline Terrill-Warren, Alda Lea, NP  aspirin EC 81 MG tablet Take 81 mg by mouth at bedtime.    [provider]  Azelastine-Fluticasone 137-50 MCG/ACT SUSP Place 1 spray into the nose every 12 (twelve) hours. 04/10/21   Coral Spikes, DO  blood glucose meter kit and supplies KIT Dispense based on  insurance preference. Tests once a day E11.9 09/25/18   [name], MD  Carboxymethylcellulose Sodium (THERATEARS) 0.25 % SOLN Place 1 drop into both eyes 2 (two) times daily.    [provider]  cholecalciferol (VITAMIN D3) 25 MCG (1000 UNIT) tablet Take 1,000 Units by mouth daily.    [provider]  ezetimibe (  ZETIA) 10 MG tablet TAKE 1 TABLET BY MOUTH EVERY DAY 07/17/21   Strader, [place] M, PA-C  famotidine (PEPCID) 20 MG tablet Take $RemoveBef'20mg'nGBsOZLNPo$  before breakfast and $RemoveBefor'20mg'SnPboXWMjBUs$  before supper. 08/29/21   Mahala Menghini, PA-C  hyoscyamine (LEVSIN SL) 0.125 MG SL tablet Take 1 tablet (0.125 mg total) by mouth daily as needed for cramping. 06/19/21   Annitta Needs, NP  ketoconazole (NIZORAL) 2 % cream Apply 1 application topically daily as needed for irritation (to affected area. external use only). Patient taking differently: Apply 1 application  topically daily. 10/27/20   Mahala Menghini, PA-C  losartan (COZAAR) 50 MG tablet Take 1 tablet (50 mg total) by mouth daily. 09/11/20   Verta Ellen., NP  meclizine (ANTIVERT) 25 MG tablet TAKE 1 TABLET BY MOUTH 3 TIMES A DAY AS NEEDED Patient taking differently: Take 25 mg by mouth daily as needed for dizziness. 06/08/20   Nilda Simmer, NP  metFORMIN (GLUCOPHAGE) 500 MG tablet TAKE ONE TABLET BY MOUTH EVERY MORNING ONE AT LUNCH AND 2 AT BEDTIME 05/28/21   Cook, Magnolia G, DO  methenamine (HIPREX) 1 g tablet TAKE 1 TABLET BY MOUTH 2 TIMES DAILY. 07/25/21   McKenzie, Candee Furbish, MD  mirabegron ER  (MYRBETRIQ) 50 MG TB24 tablet Take 1 tablet (50 mg total) by mouth daily. 10/25/20   McKenzie, Candee Furbish, MD  Multiple Vitamins-Minerals (PRESERVISION AREDS 2) CAPS Take 1 capsule by mouth 2 (two) times daily.     [provider]  nateglinide (STARLIX) 120 MG tablet TAKE 1 TABLET (120 MG TOTAL) BY MOUTH 3 (THREE) TIMES DAILY WITH MEALS 09/07/21   Cook, Upland G, DO  omega-3 acid ethyl esters (LOVAZA) 1 g capsule Take 2 g by mouth daily.    [provider]  Pancrelipase, Lip-Prot-Amyl, (CREON) 24000-76000 units CPEP TAKE 3 CAPSULES THREE TIMES DAILY WITH MEALS AND ONE CAPSULE WITH SNACK TWICE DAILY 04/05/21   Mahala Menghini, PA-C  Vitamin Mixture (ESTER-C) 500-60 MG TABS Take 1 tablet by mouth daily.     [provider]    Family History Family History  Problem Relation Age of Onset   Ovarian cancer Mother    Diabetes Father    Heart disease Father    Breast cancer Sister    Liver cancer Sister    Colon cancer Cousin        Paternal side   Diabetes Maternal Aunt    Liver disease Cousin        Maternal side, never drank    Social History Social History   Tobacco Use   Smoking status: Never   Smokeless tobacco: Never   Tobacco comments:    Never smoked  Vaping Use   Vaping Use: Never used  Substance Use Topics   Alcohol use: No    Alcohol/week: 0.0 standard drinks of alcohol   Drug use: No     Allergies   Adhesive [tape], Estrogens, and Sulfonamide derivatives   Review of Systems Review of Systems Per HPI  Physical Exam Triage Vital Signs ED Triage Vitals  Enc Vitals Group     BP 09/09/21 1342 (!) 147/65     Pulse Rate 09/09/21 1342 88     Resp 09/09/21 1342 18     Temp 09/09/21 1342 97.9 F (36.6 C)     Temp Source 09/09/21 1342 Oral     SpO2 09/09/21 1342 95 %     Weight --      Height --  Head Circumference --      Peak Flow --      Pain Score 09/09/21 1344 2     Pain Loc --      Pain Edu? --      Excl. in [place]? --    No  data found.  Updated Vital Signs BP (!) 147/65 (BP Location: Right Arm)   Pulse 88   Temp 97.9 F (36.6 C) (Oral)   Resp 18   SpO2 95%   Visual Acuity Right Eye Distance:   Left Eye Distance:   Bilateral Distance:    Right Eye Near:   Left Eye Near:    Bilateral Near:     Physical Exam Vitals reviewed.  Constitutional:      General: She is not in acute distress.    Appearance: She is well-developed.  Eyes:     Extraocular Movements: Extraocular movements intact.     Conjunctiva/sclera: Conjunctivae normal.     Pupils: Pupils are equal, round, and reactive to light.  Cardiovascular:     Rate and Rhythm: Normal rate and regular rhythm.     Pulses: Normal pulses.     Heart sounds: Normal heart sounds.  Pulmonary:     Effort: Pulmonary effort is normal.     Breath sounds: Normal breath sounds.  Abdominal:     General: Bowel sounds are normal. There is no distension.     Palpations: Abdomen is soft.     Tenderness: There is no abdominal tenderness. There is no right CVA tenderness, left CVA tenderness, guarding or rebound.  Genitourinary:    Vagina: Normal. No vaginal discharge.  Musculoskeletal:     Cervical back: Normal range of motion.     Lumbar back: Spasms and tenderness present. No swelling, deformity or bony tenderness. Normal range of motion. Negative right straight leg raise test and negative left straight leg raise test.  Skin:    General: Skin is warm and dry.     Findings: No erythema or rash.  Neurological:     General: No focal deficit present.     Mental Status: She is alert and oriented to person, place, and time.     Cranial Nerves: No cranial nerve deficit.  Psychiatric:        Mood and Affect: Mood normal.        Behavior: Behavior normal.      UC Treatments / Results  Labs (all labs ordered are listed, but only abnormal results are displayed) Labs Reviewed  POCT URINALYSIS DIP (MANUAL ENTRY) - Abnormal; Notable for the following  components:      Result Value   Leukocytes, UA Trace (*)    All other components within normal limits  URINE CULTURE    EKG   Radiology DG Lumbar Spine Complete  Result Date: 09/09/2021 CLINICAL DATA:  Chronic low back pain. Scoliosis. EXAM: LUMBAR SPINE - COMPLETE 4 VIEW COMPARISON:  CT of the abdomen and pelvis 03/17/2021 FINDINGS: Five non rib-bearing lumbar type vertebral bodies are present. Rightward curvature is centered at L4-5 with slight right lateral listhesis. Asymmetric endplate sclerosis and facet disease is present on the left. No acute fractures are present. Atherosclerotic calcifications are again noted in the aorta. IMPRESSION: 1. Stable rightward curvature of the lumbar spine. 2. Asymmetric left-sided degenerative changes at L4-5. 3. No acute abnormality. Electronically Signed   By: San Morelle M.D.   On: 09/09/2021 14:24    Procedures Procedures (including critical care time)  Medications Ordered in  UC Medications - No data to display  Initial Impression / Assessment and Plan / UC Course  I have reviewed the triage vital signs and the nursing notes.  Pertinent labs & imaging results that were available during my care of the patient were reviewed by me and considered in my medical decision making (see chart for details).  Patient presents for complaints of low back pain that is a chronic condition, but has worsened over the past several days.  She states symptoms have improved over the past 24 hours.  She has been taking Tylenol for her symptoms.  Patient has no red flag symptoms on her exam.  She does have spasm and tenderness in the bilateral lower back.  Her x-rays show any acute fractures or dislocation, x-ray does show degenerative changes at L4-L5.  Her urinalysis shows trace leukocytes, urine culture is pending as patient just completed course of antibiotics for urinary tract infection.  Will treat patient symptomatically with tramadol.  Will also have her  follow-up with her primary care physician for further evaluation.  Discussed with patient that she may need to consider following up with orthopedics as well.  Patient was provided information for [facility] [place] and for [facility] in [place].  Strict indications of when to go to the emergency department were also provided to the patient..    Final Clinical Impressions(s) / UC Diagnoses   Final diagnoses:  Low back pain without sciatica, unspecified back pain laterality, unspecified chronicity  Urinary frequency     Discharge Instructions      There are no new fractures or dislocation on your x-rays today. Take medication as prescribed.  I have prescribed Tylenol arthritis strength to see if this helps with your symptoms.  Take the tramadol for more severe pain. As discussed, you may find it beneficial to follow-up with orthopedics for further evaluation of your back pain.  He can follow-up with [facility] [place] at [phone] or [facility] in [place] at [phone]. Go to the emergency department immediately if you develop weakness in your legs or feet, loss of bowel or bladder function, inability to walk, or other concerns. Follow-up with your primary care physician if symptoms do not improve.     ED Prescriptions     Medication Sig Dispense Auth. Provider   traMADol (ULTRAM) 50 MG tablet Take 1 tablet (50 mg total) by mouth every 12 (twelve) hours as needed for severe pain. 10 tablet Gean Laursen-Warren, Alda Lea, NP   acetaminophen (TYLENOL 8 HOUR) 650 MG CR tablet Take 1 tablet (650 mg total) by mouth every 8 (eight) hours as needed for pain. 30 tablet Deklin Bieler-Warren, Alda Lea, NP      PDMP not reviewed this encounter.   Tish Men, NP 09/09/21 1438

## 2021-09-09 NOTE — Discharge Instructions (Addendum)
There are no new fractures or dislocation on your x-rays today. Take medication as prescribed.  I have prescribed Tylenol arthritis strength to see if this helps with your symptoms.  Take the tramadol for more severe pain. As discussed, you may find it beneficial to follow-up with orthopedics for further evaluation of your back pain.  He can follow-up with [facility] [place] at [phone] or [facility] in [place] at [phone]. Go to the emergency department immediately if you develop weakness in your legs or feet, loss of bowel or bladder function, inability to walk, or other concerns. Follow-up with your primary care physician if symptoms do not improve.

## 2021-09-10 ENCOUNTER — Ambulatory Visit (INDEPENDENT_AMBULATORY_CARE_PROVIDER_SITE_OTHER): Payer: Medicare Other | Admitting: Family Medicine

## 2021-09-10 VITALS — BP 123/75 | HR 75 | Temp 97.9°F | Wt 136.4 lb

## 2021-09-10 DIAGNOSIS — M5441 Lumbago with sciatica, right side: Secondary | ICD-10-CM | POA: Insufficient documentation

## 2021-09-10 LAB — URINE CULTURE: Culture: <10000 — AB

## 2021-09-10 MED ORDER — BACLOFEN 10 MG PO TABS: 5.0000 mg | ORAL_TABLET | Freq: Two times a day (BID) | ORAL | 0 refills | Status: DC | PRN

## 2021-09-10 NOTE — Assessment & Plan Note (Signed)
Adding baclofen.  Advised patient she can increase tramadol if needed.  Patient would like to see orthopedics.  Referral placed to [facility] in [place] (patient desires).

## 2021-09-10 NOTE — Patient Instructions (Signed)
Medication as prescribed.  Referral to [facility] placed.  You can take 100 mg of Tramadol if you need it (2 tablets).  Take care  Dr. Lacinda Axon

## 2021-09-10 NOTE — Progress Notes (Signed)
Subjective:  Patient ID: Deborah Gray, female    DOB: 1933/09/20  Age: 88 y.o. MRN: [id number]  CC: Chief Complaint  Patient presents with   Back Pain    And pain in right thigh and in right knee.  Patient went to Urgent Care on 09/09/21 for the back pain. Patient says she is unable to sleep due to the back pain.    HPI:  88 year old female presents with complaints of low back pain.  Patient reports that she has had bilateral low back pain starting Thursday.  She states that it is interfering with sleep.  She also reports pain in the right thigh.  I suspect that this is from the low back.  No recent fall, trauma, injury.  She went to urgent care yesterday.  X-ray revealed degenerative changes.  She was given tramadol but states that she continues to have significant pain.  Worse when trying to lie down.  She has difficulty getting comfortable.  Seems to be better tolerated when she is up and moving about.   Patient Active Problem List   Diagnosis Date Noted   Acute bilateral low back pain with right-sided sciatica 09/10/2021   Early satiety 07/11/2021   Nephrolithiasis 03/15/2021   IBS (irritable bowel syndrome) 01/30/2015   Type 2 diabetes mellitus ([place]) 12/22/2014   Hyperlipidemia, unspecified 12/22/2014   Essential hypertension 11/25/2013   Osteopenia 10/23/2012   Chronic pancreatitis ([place]) 02/26/2011   GERD (gastroesophageal reflux disease) 01/18/2011   DJD (degenerative joint disease) 11/29/2010   Diarrhea 06/29/2009   Hx of adenomatous colonic polyps 06/29/2009    Social Hx   Social History   Socioeconomic History   Marital status: Widowed    Spouse name: Not on file   Number of children: 3   Years of education: Not on file   Highest education level: Not on file  Occupational History   Occupation: Retired  Tobacco Use   Smoking status: Never   Smokeless tobacco: Never   Tobacco comments:    Never smoked  Vaping Use   Vaping Use: Never used  Substance and  Sexual Activity   Alcohol use: No    Alcohol/week: 0.0 standard drinks of alcohol   Drug use: No   Sexual activity: Not Currently  Other Topics Concern   Not on file  Social History Narrative   Widowed since 07/2018. Married x 61 years.   3 sons. [place] and 1966.   Social Determinants of Health   Financial Resource Strain: Low Risk  (05/15/2021)   Overall Financial Resource Strain (CARDIA)    Difficulty of Paying Living Expenses: Not hard at all  Food Insecurity: No Food Insecurity (05/15/2021)   Hunger Vital Sign    Worried About Running Out of Food in the Last Year: Never true    Ran Out of Food in the Last Year: Never true  Transportation Needs: No Transportation Needs (05/15/2021)   PRAPARE - Hydrologist (Medical): No    Lack of Transportation (Non-Medical): No  Physical Activity: Insufficiently Active (05/15/2021)   Exercise Vital Sign    Days of Exercise per Week: 5 days    Minutes of Exercise per Session: 20 min  Stress: No Stress Concern Present (05/15/2021)   Edith Endave    Feeling of Stress : Not at all  Social Connections: Moderately Integrated (05/15/2021)   Social Connection and Isolation Panel [NHANES]    Frequency of  Communication with Friends and Family: More than three times a week    Frequency of Social Gatherings with Friends and Family: More than three times a week    Attends Religious Services: More than 4 times per year    Active Member of [employer] or Organizations: Yes    Attends Archivist Meetings: More than 4 times per year    Marital Status: Widowed    Review of Systems Per HPI  Objective:  BP 123/75   Pulse 75   Temp 97.9 F (36.6 C) (Oral)   Wt 136 lb 6.4 oz (61.9 kg)   SpO2 97%   BMI 23.41 kg/m      09/10/2021   11:29 AM 09/09/2021    1:42 PM 09/03/2021    3:10 PM  BP/Weight  Systolic BP 951 884 166  Diastolic BP 75 65 64  Wt. (Lbs)  136.4    BMI 23.41 kg/m2      Physical Exam Vitals and nursing note reviewed.  Constitutional:      General: She is not in acute distress.    Appearance: Normal appearance.  HENT:     Head: Normocephalic and atraumatic.  Eyes:     General:        Right eye: No discharge.        Left eye: No discharge.     Conjunctiva/sclera: Conjunctivae normal.  Cardiovascular:     Rate and Rhythm: Normal rate and regular rhythm.  Pulmonary:     Effort: Pulmonary effort is normal.     Breath sounds: Normal breath sounds. No wheezing, rhonchi or rales.  Musculoskeletal:     Comments: Tenderness over the right trochanter.  Patient also has tenderness/tightness of the paraspinal musculature bilaterally of the lumbar spine.  Neurological:     Mental Status: She is alert.  Psychiatric:        Mood and Affect: Mood normal.        Behavior: Behavior normal.     Lab Results  Component Value Date   WBC 8.0 05/07/2021   HGB 12.9 05/07/2021   HCT 38.9 05/07/2021   PLT 247 05/07/2021   GLUCOSE 115 (H) 05/07/2021   CHOL 179 05/07/2021   TRIG 95 05/07/2021   HDL 73 05/07/2021   LDLCALC 89 05/07/2021   ALT 15 05/07/2021   AST 16 05/07/2021   NA 140 05/07/2021   K 5.0 05/07/2021   CL 102 05/07/2021   CREATININE 0.67 05/07/2021   BUN 33 (H) 05/07/2021   CO2 25 05/07/2021   TSH 1.760 05/07/2021   INR 1.7 (H) 04/08/2008   HGBA1C 6.1 (H) 03/15/2021   MICROALBUR 1.0 11/22/2013     Assessment & Plan:   Problem List Items Addressed This Visit       Nervous and Auditory   Acute bilateral low back pain with right-sided sciatica - Primary    Adding baclofen.  Advised patient she can increase tramadol if needed.  Patient would like to see orthopedics.  Referral placed to [facility] in [place] (patient desires).      Relevant Medications   baclofen (LIORESAL) 10 MG tablet   Other Relevant Orders   Ambulatory referral to Orthopedic Surgery    Meds ordered this encounter  Medications    baclofen (LIORESAL) 10 MG tablet    Sig: Take 0.5-1 tablets (5-10 mg total) by mouth 2 (two) times daily as needed for muscle spasms (Back pain).    Dispense:  30 each  Refill:  [place]

## 2021-09-13 ENCOUNTER — Other Ambulatory Visit: Payer: Self-pay | Admitting: Family Medicine

## 2021-09-13 ENCOUNTER — Telehealth: Payer: Self-pay

## 2021-09-13 MED ORDER — HYDROCODONE-ACETAMINOPHEN 5-325 MG PO TABS: 1.0000 | ORAL_TABLET | Freq: Three times a day (TID) | ORAL | 0 refills | Status: DC | PRN

## 2021-09-13 NOTE — Telephone Encounter (Signed)
Pt contacted and verbalized understanding.  

## 2021-09-13 NOTE — Telephone Encounter (Signed)
Pt states she can double muscle relaxer and/or Tramadol if that is ok. Pt is also ok with having something sent in if PCP thinks that's been. Daytime pt has discomfort, pain is mostly at night. Pt sees Dr.Murphy on Monday.  [facility]

## 2021-09-13 NOTE — Telephone Encounter (Signed)
Caller name:Lennon Zack Seal   On DPR? :Yes  Call back number:[phone]  Provider they see: Lacinda Axon   Reason for call:Pt was seen this week for back pain she said it has got so bad she can not get no sleep pain in her thigh right side she is wanting to know if she needs to go on to the hospital she has appt with Dr Percell Miller in [place] she can't stand the pain that long. She said she had a bad pain attack last night

## 2021-09-13 NOTE — Telephone Encounter (Signed)
Please advise. Thank you

## 2021-09-17 ENCOUNTER — Other Ambulatory Visit: Payer: Self-pay | Admitting: Family Medicine

## 2021-09-17 DIAGNOSIS — M545 Low back pain, unspecified: Secondary | ICD-10-CM | POA: Diagnosis not present

## 2021-09-17 DIAGNOSIS — M25551 Pain in right hip: Secondary | ICD-10-CM | POA: Diagnosis not present

## 2021-09-18 ENCOUNTER — Other Ambulatory Visit: Payer: Medicare Other

## 2021-09-18 DIAGNOSIS — R300 Dysuria: Secondary | ICD-10-CM | POA: Diagnosis not present

## 2021-09-19 ENCOUNTER — Telehealth: Payer: Self-pay

## 2021-09-19 MED ORDER — NITROFURANTOIN MONOHYD MACRO 100 MG PO CAPS: 100.0000 mg | ORAL_CAPSULE | Freq: Two times a day (BID) | ORAL | 0 refills | Status: DC

## 2021-09-19 NOTE — Telephone Encounter (Signed)
Patient dropped off urine on 08/02.  Verbal from Dr. Alyson Ingles based on results, patient to take Macrobid 100 BID.  Rx sent to pharmacy and patient aware.  Patient states that she had left over Macrobid '100mg'$  from her last kidney infection.  She stated she will take that until she gets her new rx.  I advised her not to take the old rx to just begin the new antibiotic sent in.  She assured that she will not take more than the '100mg'$  BID that was prescribed.

## 2021-09-21 LAB — URINALYSIS, ROUTINE W REFLEX MICROSCOPIC
Bilirubin, UA: NEGATIVE
Ketones, UA: NEGATIVE
Nitrite, UA: POSITIVE — AB
Protein,UA: NEGATIVE
Specific Gravity, UA: 1.01 (ref 1.005–1.030)
Urobilinogen, Ur: 0.2 mg/dL (ref 0.2–1.0)
pH, UA: 5.5 (ref 5.0–7.5)

## 2021-09-21 LAB — MICROSCOPIC EXAMINATION
Epithelial Cells (non renal): >10 /hpf — AB (ref 0–10)
Renal Epithel, UA: NONE SEEN /hpf
WBC, UA: >30 /hpf — AB (ref 0–5)

## 2021-09-23 LAB — URINE CULTURE

## 2021-09-28 ENCOUNTER — Other Ambulatory Visit: Payer: Self-pay | Admitting: Family Medicine

## 2021-09-28 ENCOUNTER — Other Ambulatory Visit: Payer: Self-pay | Admitting: *Deleted

## 2021-09-28 ENCOUNTER — Telehealth: Payer: Self-pay | Admitting: *Deleted

## 2021-09-28 MED ORDER — LOSARTAN POTASSIUM 50 MG PO TABS: 50.0000 mg | ORAL_TABLET | Freq: Every day | ORAL | 3 refills | Status: DC

## 2021-09-28 MED ORDER — MECLIZINE HCL 25 MG PO TABS: 25.0000 mg | ORAL_TABLET | Freq: Three times a day (TID) | ORAL | 1 refills | Status: DC | PRN

## 2021-09-28 NOTE — Telephone Encounter (Signed)
Patient called and would like to have refill of Antivert to keep on hand in case of vertigo attack. Not currently having an attack but wants to have it on hand.

## 2021-09-28 NOTE — Telephone Encounter (Signed)
Cook, Jayce G, DO   ? ?Rx sent.   ? ?

## 2021-09-28 NOTE — Telephone Encounter (Signed)
Patient notified

## 2021-10-01 ENCOUNTER — Other Ambulatory Visit: Payer: Self-pay | Admitting: Physician Assistant

## 2021-10-01 ENCOUNTER — Ambulatory Visit: Payer: Medicare Other | Admitting: Physician Assistant

## 2021-10-01 ENCOUNTER — Telehealth: Payer: Self-pay

## 2021-10-01 ENCOUNTER — Other Ambulatory Visit: Payer: Medicare Other

## 2021-10-01 ENCOUNTER — Telehealth: Payer: Self-pay | Admitting: *Deleted

## 2021-10-01 DIAGNOSIS — Z87440 Personal history of urinary (tract) infections: Secondary | ICD-10-CM

## 2021-10-01 DIAGNOSIS — R399 Unspecified symptoms and signs involving the genitourinary system: Secondary | ICD-10-CM

## 2021-10-01 DIAGNOSIS — M5451 Vertebrogenic low back pain: Secondary | ICD-10-CM | POA: Diagnosis not present

## 2021-10-01 DIAGNOSIS — R300 Dysuria: Secondary | ICD-10-CM

## 2021-10-01 MED ORDER — METFORMIN HCL 500 MG PO TABS: ORAL_TABLET | ORAL | 1 refills | Status: DC

## 2021-10-01 MED ORDER — DOXYCYCLINE HYCLATE 100 MG PO CAPS: 100.0000 mg | ORAL_CAPSULE | Freq: Two times a day (BID) | ORAL | 0 refills | Status: DC

## 2021-10-01 NOTE — Telephone Encounter (Signed)
Patient called this am with UTI symptoms. Patient given lab appointment to leave urine specimen for today.

## 2021-10-01 NOTE — Telephone Encounter (Signed)
Patient called and notified of new prescription and to hold methenamine. Patient voiced understanding.

## 2021-10-01 NOTE — Telephone Encounter (Signed)
-----   Message from Reynaldo Minium, [place] sent at 10/01/2021  3:29 PM EDT ----- Please let the pt know her urine does look like another UTI. I sent in a new Rx for Doxycycline based on her recent cultures. She needs to hold her methenamine until we get this current infection under control. We will call her when her culture comes in in a couple of days.

## 2021-10-01 NOTE — Progress Notes (Signed)
Urine drop off >30WBC, 3-10RBC, many bacteria, nitrite positive Based on previous cxs, will start Doxycycline and culture ordered.

## 2021-10-01 NOTE — Telephone Encounter (Signed)
Patient called requesting refill of Metformin to [facility]. Prescription sent electronically to pharmacy. Patient aware.

## 2021-10-02 ENCOUNTER — Other Ambulatory Visit: Payer: Self-pay | Admitting: Physician Assistant

## 2021-10-02 DIAGNOSIS — H34832 Tributary (branch) retinal vein occlusion, left eye, with macular edema: Secondary | ICD-10-CM | POA: Diagnosis not present

## 2021-10-03 ENCOUNTER — Ambulatory Visit: Payer: Medicare Other | Admitting: Physician Assistant

## 2021-10-03 LAB — URINALYSIS, ROUTINE W REFLEX MICROSCOPIC
Bilirubin, UA: NEGATIVE
Glucose, UA: NEGATIVE
Ketones, UA: NEGATIVE
Nitrite, UA: POSITIVE — AB
Specific Gravity, UA: 1.01 (ref 1.005–1.030)
Urobilinogen, Ur: 0.2 mg/dL (ref 0.2–1.0)
pH, UA: 5.5 (ref 5.0–7.5)

## 2021-10-03 LAB — MICROSCOPIC EXAMINATION: WBC, UA: >30 /hpf — AB (ref 0–5)

## 2021-10-04 ENCOUNTER — Telehealth: Payer: Self-pay

## 2021-10-04 LAB — URINE CULTURE

## 2021-10-04 NOTE — Telephone Encounter (Signed)
Patient returned call.   Left a Voice Message.  Call patient back at [phone]  Thanks, Helene Kelp

## 2021-10-04 NOTE — Telephone Encounter (Signed)
-----   Message from Reynaldo Minium, [place] sent at 10/04/2021  8:55 AM EDT ----- [facility] let [facility] know [facility] indicates doxy will cover UTI well. ----- Message ----- From: Sherrilyn Rist, CMA Sent: 10/04/2021   8:32 AM EDT To: Berneice Heinrich [place], PA-C  Please Review. Patient has appt 08/23

## 2021-10-04 NOTE — Telephone Encounter (Signed)
Tried calling patient with no answer. Left voice message for patient to cal the office back. Will try back later.

## 2021-10-05 ENCOUNTER — Telehealth: Payer: Self-pay

## 2021-10-05 NOTE — Telephone Encounter (Signed)
-----   Message from Reynaldo Minium, [place] sent at 10/04/2021  8:55 AM EDT ----- [facility] let [facility] know [facility] indicates doxy will cover UTI well. ----- Message ----- From: Sherrilyn Rist, CMA Sent: 10/04/2021   8:32 AM EDT To: Berneice Heinrich [place], PA-C  Please Review. Patient has appt 08/23

## 2021-10-05 NOTE — Telephone Encounter (Signed)
Tried calling patient with no answer and will try back later

## 2021-10-08 ENCOUNTER — Telehealth: Payer: Self-pay

## 2021-10-08 NOTE — Telephone Encounter (Signed)
-----   Message from Reynaldo Minium, [place] sent at 10/04/2021  8:55 AM EDT ----- [facility] let [facility] know [facility] indicates doxy will cover UTI well. ----- Message ----- From: Sherrilyn Rist, CMA Sent: 10/04/2021   8:32 AM EDT To: Berneice Heinrich [place], PA-C  Please Review. Patient has appt 08/23

## 2021-10-08 NOTE — Telephone Encounter (Signed)
Made patient aware and she voice understanding. Patient will keep appt as schedule and has finished her doxy on last week Wednesday.

## 2021-10-09 DIAGNOSIS — B351 Tinea unguium: Secondary | ICD-10-CM | POA: Diagnosis not present

## 2021-10-09 DIAGNOSIS — E1142 Type 2 diabetes mellitus with diabetic polyneuropathy: Secondary | ICD-10-CM | POA: Diagnosis not present

## 2021-10-10 ENCOUNTER — Ambulatory Visit (INDEPENDENT_AMBULATORY_CARE_PROVIDER_SITE_OTHER): Payer: Medicare Other | Admitting: Physician Assistant

## 2021-10-10 VITALS — BP 156/74 | HR 98

## 2021-10-10 DIAGNOSIS — N3010 Interstitial cystitis (chronic) without hematuria: Secondary | ICD-10-CM | POA: Diagnosis not present

## 2021-10-10 MED ORDER — DIMETHYL SULFOXIDE 50 % IS SOLN
50.0000 mL | Freq: Once | INTRAVESICAL | Status: AC
  Administered 2021-10-10: 50 mL via URETHRAL

## 2021-10-10 MED ORDER — LIDOCAINE HCL 2 % IJ SOLN
10.0000 mL | Freq: Once | INTRAMUSCULAR | Status: AC
  Administered 2021-10-10: 200 mg

## 2021-10-10 MED ORDER — HYDROCORTISONE SOD SUC (PF) 100 MG IJ SOLR
100.0000 mg | Freq: Once | INTRAMUSCULAR | Status: AC
  Administered 2021-10-10: 100 mg

## 2021-10-10 MED ORDER — SODIUM BICARBONATE 8.4 % IV SOLN: 50.0000 meq | Freq: Once | INTRAVENOUS | Status: DC

## 2021-10-10 MED ORDER — SODIUM BICARBONATE 8.4 % IV SOLN
50.0000 meq | Freq: Once | INTRAVENOUS | Status: AC
  Administered 2021-10-10: 50 meq

## 2021-10-10 NOTE — Progress Notes (Unsigned)
Assessment: 1. Interstitial cystitis - dimethyl sulfoxide 50 % solution 50 mL - sodium bicarbonate injection 50 mEq - hydrocortisone sodium succinate (SOLU-CORTEF) 100 MG injection 100 mg - lidocaine (XYLOCAINE) 2 % (with pres) injection 200 mg - Urinalysis, Routine w reflex microscopic    Plan: FU as scheduled for next DMSO. Will FU if sxs of UTI return. Continue Myrbetriq  Chief Complaint: No chief complaint on file.   HPI: Deborah Gray is a 88 y.o. female who presents for continued evaluation of IC, scheduled for DMSO. No c/o since last UTI tx last week. Cx 10/01/21-E.Coli Pan sensitive, tx with Doxy. No gross hematuria UA=clear   Portions of the above documentation were copied from a prior visit for review purposes only.  Allergies: Allergies  Allergen Reactions   Adhesive [Tape] Other (See Comments)    Reaction:  Blisters    Estrogens Other (See Comments)    Reaction:  Migraines    Sulfonamide Derivatives Rash    PMH: Past Medical History:  Diagnosis Date   Arthritis    Benign neoplasm of colon    Constipation    Diaphragmatic hernia without mention of obstruction or gangrene    Diverticulosis of colon (without mention of hemorrhage)    Dysphagia, pharyngoesophageal phase    Esophageal reflux    Essential hypertension    Heart murmur    Hemorrhoids    Hyperlipidemia    Internal hemorrhoids without mention of complication    Left wrist fracture    PONV (postoperative nausea and vomiting)    Stricture and stenosis of esophagus    Type 2 diabetes mellitus (HCC)    Unspecified gastritis and gastroduodenitis without mention of hemorrhage     PSH: Past Surgical History:  Procedure Laterality Date   ABDOMINAL HYSTERECTOMY     BACTERIAL OVERGROWTH TEST N/A 09/06/2015   Procedure: BACTERIAL OVERGROWTH TEST;  Surgeon: Daneil Dolin, MD;  [facility];  Service: Endoscopy;  Laterality: N/A;  800   BREAST LUMPECTOMY Right    benign    CATARACT EXTRACTION Bilateral 2006   Implants in both, surgeries done 6 weeks apart   CHOLECYSTECTOMY     COLONOSCOPY  03/08/02   [name]: diverticulosis, internal hemorrhoids, adenomatous colon polyp   COLONOSCOPY  01/23/04   [name]: diverticulosis, internal hemorrhoids   COLONOSCOPY  09/25/05   [name]: diverticulosis   COLONOSCOPY  07/05/09   [name]: severe diverticulosis   COLONOSCOPY  01/21/11   [name]: severe diverticulosis in sigmoid to desc colon, int hemorrhoids, follow up TCS in 5 years   COLONOSCOPY N/A 04/10/2016   [name]: diverticulosis   CYSTOSCOPY W/ URETERAL STENT PLACEMENT Right 03/15/2021   Procedure: CYSTOSCOPY WITH RETROGRADE PYELOGRAM/URETERAL STENT PLACEMENT;  Surgeon: Janith Lima, MD;  Location: [facility];  Service: Urology;  Laterality: Right;   CYSTOSCOPY WITH RETROGRADE PYELOGRAM, URETEROSCOPY AND STENT PLACEMENT Right 03/29/2021   Procedure: CYSTOSCOPY WITH RETROGRADE PYELOGRAM, URETEROSCOPY AND STENT EXCHANGE;  Surgeon: Cleon Gustin, MD;  Location: [facility];  Service: Urology;  Laterality: Right;   DILATION AND CURETTAGE OF UTERUS     ESOPHAGEAL MANOMETRY  03/21/08   Patterson: findings c/w Nutcracker Esophagus   ESOPHAGOGASTRODUODENOSCOPY  03/08/02   [name]: esophageal stricture, chronic gerd, s/p dilation.    ESOPHAGOGASTRODUODENOSCOPY  01/23/04   [name]: esophageal stricture, gastritis, hiatal hernia   ESOPHAGOGASTRODUODENOSCOPY  09/25/05   [name]: gastritis, benign bx, no H.pylori   ESOPHAGOGASTRODUODENOSCOPY  07/05/09   [name]: gastropathy, benign small bowel bx and gastric bx  ESOPHAGOGASTRODUODENOSCOPY N/A 05/15/2015   Dr. Gala Romney- diffuse moderate inflammation characterized by congestion (edema), erythema, and linear erosions was found in the entire examined stomach. bx= reactive gastropathy   FOOT SURGERY Left    HEMORRHOID BANDING  2017   Dr.[name]   HOLMIUM LASER APPLICATION Right 03/25/5807   Procedure: HOLMIUM LASER APPLICATION;   Surgeon: Cleon Gustin, MD;  Location: [facility];  Service: Urology;  Laterality: Right;   LAPAROSCOPIC VAGINAL HYSTERECTOMY     ORIF WRIST FRACTURE Left 06/23/2018   Procedure: OPEN REDUCTION INTERNAL FIXATION (ORIF) LEFT WRIST FRACTURE;  Surgeon: Renette Butters, MD;  Location: [facility];  Service: Orthopedics;  Laterality: Left;  regional arm block   RECTOCELE REPAIR     TOTAL KNEE ARTHROPLASTY Right     SH: Social History   Tobacco Use   Smoking status: Never   Smokeless tobacco: Never   Tobacco comments:    Never smoked  Vaping Use   Vaping Use: Never used  Substance Use Topics   Alcohol use: No    Alcohol/week: 0.0 standard drinks of alcohol   Drug use: No    ROS: All other review of systems were reviewed and are negative except what is noted above in HPI  PE: BP (!) 156/74   Pulse 98  GENERAL APPEARANCE:  Well appearing, well developed, well nourished, NAD HEENT:  Atraumatic, normocephalic NECK:  Supple. Trachea midline Lungs: Unlabored respirations ABDOMEN:  Soft, non-tender, no masses  Results:   Urinalysis    Component Value Date/Time   COLORURINE YELLOW 03/15/2021 1740   APPEARANCEUR Cloudy (A) 10/01/2021 1050   LABSPEC 1.015 03/15/2021 1740   PHURINE 5.0 03/15/2021 1740   GLUCOSEU Negative 10/01/2021 1050   HGBUR NEGATIVE 03/15/2021 1740   BILIRUBINUR Negative 10/01/2021 1050   KETONESUR negative 09/09/2021 1356   KETONESUR NEGATIVE 03/15/2021 1740   PROTEINUR Trace (A) 10/01/2021 1050   PROTEINUR 30 (A) 03/15/2021 1740   UROBILINOGEN 0.2 09/09/2021 1356   UROBILINOGEN 0.2 04/08/2008 1454   NITRITE Positive (A) 10/01/2021 1050   NITRITE NEGATIVE 03/15/2021 1740   LEUKOCYTESUR 3+ (A) 10/01/2021 1050   LEUKOCYTESUR LARGE (A) 03/15/2021 1740    Lab Results  Component Value Date   LABMICR See below: 10/01/2021   WBCUA >30 (A) 10/01/2021   LABEPIT 0-10 10/01/2021   MUCUS Present 08/29/2021   BACTERIA Many (A) 10/01/2021     Pertinent Imaging: No results found for this or any previous visit.  No results found for this or any previous visit.  No results found for this or any previous visit.  No results found for this or any previous visit.  Results for orders placed during the hospital encounter of 07/11/21  US RENAL  Narrative CLINICAL DATA:  History of nephrolithiasis.  Follow-up exam.  EXAM: RENAL / URINARY TRACT ULTRASOUND COMPLETE  COMPARISON:  Ultrasound renal 04/11/2021  FINDINGS: Right Kidney:  Renal measurements: 12.5 x 5.3 x 4.8 cm = volume: 166.2 mL. Echogenicity within normal limits. No mass or hydronephrosis visualized. Superior pole cyst measuring up to 3.5 cm.  Left Kidney:  Renal measurements: 11.3 x 5.7 x 5.0 cm = volume: 169.3 mL. Echogenicity within normal limits. No mass or hydronephrosis visualized. There are multiple cysts measuring up to 3 cm within the superior pole. There are at least 2 echogenic shadowing foci favored to represent stones measuring up to 5 mm.  Bladder:  Appears normal for degree of bladder distention.  Other:  None.  IMPRESSION: No hydronephrosis.  Probable left nephrolithiasis.   Electronically Signed By: Lovey Newcomer M.D. On: 07/12/2021 08:42  No results found for this or any previous visit.  No results found for this or any previous visit.  No results found for this or any previous visit.  No results found for this or any previous visit (from the past 24 hour(s)).

## 2021-10-10 NOTE — Progress Notes (Unsigned)
Bladder Instillation DMSO  Due to IC patient is present today for a Bladder Instillation of DMSO treatment. Patient was cleaned and prepped in a sterile fashion with betadine.  A 20 FR catheter was inserted, urine return was noted 200 ml, urine was Yellow in color.  DMSO treatment was instilled into the bladder. The catheter was then removed. Patient tolerated well, no complications were noted pt was instructed to hold the instillation for 20 minutes and then will void it out. Pt voiced understanding.    Preformed by: Marisue Brooklyn, CMA  Follow up/ Additional notes: Follow up as schedule

## 2021-10-11 DIAGNOSIS — M5451 Vertebrogenic low back pain: Secondary | ICD-10-CM | POA: Diagnosis not present

## 2021-10-11 LAB — URINALYSIS, ROUTINE W REFLEX MICROSCOPIC
Bilirubin, UA: NEGATIVE
Glucose, UA: NEGATIVE
Ketones, UA: NEGATIVE
Nitrite, UA: NEGATIVE
Protein,UA: NEGATIVE
RBC, UA: NEGATIVE
Specific Gravity, UA: 1.015 (ref 1.005–1.030)
Urobilinogen, Ur: 0.2 mg/dL (ref 0.2–1.0)
pH, UA: 5.5 (ref 5.0–7.5)

## 2021-10-11 LAB — MICROSCOPIC EXAMINATION
Bacteria, UA: NONE SEEN
RBC, Urine: NONE SEEN /hpf (ref 0–2)
Renal Epithel, UA: NONE SEEN /hpf

## 2021-10-15 DIAGNOSIS — M5451 Vertebrogenic low back pain: Secondary | ICD-10-CM | POA: Diagnosis not present

## 2021-10-23 ENCOUNTER — Ambulatory Visit (HOSPITAL_COMMUNITY): Payer: Medicare Other

## 2021-10-24 DIAGNOSIS — M5451 Vertebrogenic low back pain: Secondary | ICD-10-CM | POA: Diagnosis not present

## 2021-10-29 DIAGNOSIS — M5451 Vertebrogenic low back pain: Secondary | ICD-10-CM | POA: Diagnosis not present

## 2021-11-05 DIAGNOSIS — M5451 Vertebrogenic low back pain: Secondary | ICD-10-CM | POA: Diagnosis not present

## 2021-11-06 ENCOUNTER — Other Ambulatory Visit: Payer: Self-pay | Admitting: Gastroenterology

## 2021-11-07 ENCOUNTER — Ambulatory Visit: Payer: Medicare Other | Admitting: Physician Assistant

## 2021-11-07 DIAGNOSIS — M25551 Pain in right hip: Secondary | ICD-10-CM | POA: Diagnosis not present

## 2021-11-08 ENCOUNTER — Ambulatory Visit (INDEPENDENT_AMBULATORY_CARE_PROVIDER_SITE_OTHER): Payer: Medicare Other | Admitting: Physician Assistant

## 2021-11-08 VITALS — BP 149/77 | HR 116

## 2021-11-08 DIAGNOSIS — N3010 Interstitial cystitis (chronic) without hematuria: Secondary | ICD-10-CM | POA: Diagnosis not present

## 2021-11-08 MED ORDER — DIMETHYL SULFOXIDE 50 % IS SOLN
50.0000 mL | Freq: Once | INTRAVESICAL | Status: AC
  Administered 2021-11-08: 50 mL via URETHRAL

## 2021-11-08 MED ORDER — HYDROCORTISONE SOD SUC (PF) 100 MG IJ SOLR
100.0000 mg | Freq: Once | INTRAMUSCULAR | Status: AC
  Administered 2021-11-08: 100 mg

## 2021-11-08 MED ORDER — LIDOCAINE HCL 2 % IJ SOLN
10.0000 mL | Freq: Once | INTRAMUSCULAR | Status: AC
  Administered 2021-11-08: 200 mg

## 2021-11-08 MED ORDER — SODIUM BICARBONATE 8.4 % IV SOLN
50.0000 meq | Freq: Once | INTRAVENOUS | Status: AC
  Administered 2021-11-08: 50 meq

## 2021-11-08 NOTE — Progress Notes (Signed)
Bladder Instillation DMSO  Due to IC patient is present today for a Bladder Instillation of DMSO treatment. Patient was cleaned and prepped in a sterile fashion with betadine.  A 18 FR catheter was inserted. DMSO treatment was instilled into the bladder. The catheter was then removed. Patient tolerated well, no complications were noted pt was instructed to hold the instillation for 20 minutes and then will void it out. Pt voiced understanding.    Preformed by: Marisue Brooklyn, CMA  Follow up/ Additional notes: Follow up as scheduled.  Patient was called and she verbalized that she voided 3 to 4 times after DMSO treatment.

## 2021-11-09 LAB — URINALYSIS, ROUTINE W REFLEX MICROSCOPIC
Bilirubin, UA: NEGATIVE
Glucose, UA: NEGATIVE
Ketones, UA: NEGATIVE
Leukocytes,UA: NEGATIVE
Nitrite, UA: NEGATIVE
Protein,UA: NEGATIVE
RBC, UA: NEGATIVE
Specific Gravity, UA: 1.015 (ref 1.005–1.030)
Urobilinogen, Ur: 0.2 mg/dL (ref 0.2–1.0)
pH, UA: 7.5 (ref 5.0–7.5)

## 2021-11-14 ENCOUNTER — Telehealth: Payer: Self-pay

## 2021-11-14 DIAGNOSIS — E785 Hyperlipidemia, unspecified: Secondary | ICD-10-CM

## 2021-11-14 DIAGNOSIS — Z79899 Other long term (current) drug therapy: Secondary | ICD-10-CM

## 2021-11-14 DIAGNOSIS — E871 Hypo-osmolality and hyponatremia: Secondary | ICD-10-CM

## 2021-11-14 DIAGNOSIS — R61 Generalized hyperhidrosis: Secondary | ICD-10-CM

## 2021-11-14 DIAGNOSIS — E1100 Type 2 diabetes mellitus with hyperosmolarity without nonketotic hyperglycemic-hyperosmolar coma (NKHHC): Secondary | ICD-10-CM

## 2021-11-14 NOTE — Progress Notes (Signed)
Assessment: 1. Interstitial cystitis - Urinalysis, Routine w reflex microscopic - sodium bicarbonate injection 50 mEq - dimethyl sulfoxide 50 % solution 50 mL - hydrocortisone sodium succinate (SOLU-CORTEF) 100 MG injection 100 mg - lidocaine (XYLOCAINE) 2 % (with pres) injection 200 mg - In and Out Cath    Plan: Keep FU for next DMSO as scheduled.  Continue Myrbetriq 50 mg daily.    Chief Complaint: No chief complaint on file.   HPI: Deborah Gray is a 88 y.o. female who presents for continued evaluation of IC with planned DMSO. No gross hematuria, dysuria, burning.. She has started having symptoms of spraying of her urine with some recent increase in frequency.  She states she has completed recent prescription of doxycycline.  No gross hematuria.  Urine clear today.   Portions of the above documentation were copied from a prior visit for review purposes only.  Allergies: Allergies  Allergen Reactions   Adhesive [Tape] Other (See Comments)    Reaction:  Blisters    Estrogens Other (See Comments)    Reaction:  Migraines    Sulfonamide Derivatives Rash    PMH: Past Medical History:  Diagnosis Date   Arthritis    Benign neoplasm of colon    Constipation    Diaphragmatic hernia without mention of obstruction or gangrene    Diverticulosis of colon (without mention of hemorrhage)    Dysphagia, pharyngoesophageal phase    Esophageal reflux    Essential hypertension    Heart murmur    Hemorrhoids    Hyperlipidemia    Internal hemorrhoids without mention of complication    Left wrist fracture    PONV (postoperative nausea and vomiting)    Stricture and stenosis of esophagus    Type 2 diabetes mellitus (HCC)    Unspecified gastritis and gastroduodenitis without mention of hemorrhage     PSH: Past Surgical History:  Procedure Laterality Date   ABDOMINAL HYSTERECTOMY     BACTERIAL OVERGROWTH TEST N/A 09/06/2015   Procedure: BACTERIAL OVERGROWTH TEST;   Surgeon: Daneil Dolin, MD;  [facility];  Service: Endoscopy;  Laterality: N/A;  800   BREAST LUMPECTOMY Right    benign   CATARACT EXTRACTION Bilateral 2006   Implants in both, surgeries done 6 weeks apart   CHOLECYSTECTOMY     COLONOSCOPY  03/08/02   [name]: diverticulosis, internal hemorrhoids, adenomatous colon polyp   COLONOSCOPY  01/23/04   [name]: diverticulosis, internal hemorrhoids   COLONOSCOPY  09/25/05   [name]: diverticulosis   COLONOSCOPY  07/05/09   [name]: severe diverticulosis   COLONOSCOPY  01/21/11   [name]: severe diverticulosis in sigmoid to desc colon, int hemorrhoids, follow up TCS in 5 years   COLONOSCOPY N/A 04/10/2016   [name]: diverticulosis   CYSTOSCOPY W/ URETERAL STENT PLACEMENT Right 03/15/2021   Procedure: CYSTOSCOPY WITH RETROGRADE PYELOGRAM/URETERAL STENT PLACEMENT;  Surgeon: Janith Lima, MD;  Location: [facility];  Service: Urology;  Laterality: Right;   CYSTOSCOPY WITH RETROGRADE PYELOGRAM, URETEROSCOPY AND STENT PLACEMENT Right 03/29/2021   Procedure: CYSTOSCOPY WITH RETROGRADE PYELOGRAM, URETEROSCOPY AND STENT EXCHANGE;  Surgeon: Cleon Gustin, MD;  Location: [facility];  Service: Urology;  Laterality: Right;   DILATION AND CURETTAGE OF UTERUS     ESOPHAGEAL MANOMETRY  03/21/08   Patterson: findings c/w Nutcracker Esophagus   ESOPHAGOGASTRODUODENOSCOPY  03/08/02   [name]: esophageal stricture, chronic gerd, s/p dilation.    ESOPHAGOGASTRODUODENOSCOPY  01/23/04   [name]: esophageal stricture, gastritis, hiatal hernia   ESOPHAGOGASTRODUODENOSCOPY  09/25/05   Patterson: gastritis, benign bx, no H.pylori   ESOPHAGOGASTRODUODENOSCOPY  07/05/09   [name]: gastropathy, benign small bowel bx and gastric bx   ESOPHAGOGASTRODUODENOSCOPY N/A 05/15/2015   Dr. Gala Romney- diffuse moderate inflammation characterized by congestion (edema), erythema, and linear erosions was found in the entire examined stomach. bx= reactive gastropathy   FOOT  SURGERY Left    HEMORRHOID BANDING  2017   Dr.[name]   HOLMIUM LASER APPLICATION Right 10/20/1192   Procedure: HOLMIUM LASER APPLICATION;  Surgeon: Cleon Gustin, MD;  Location: [facility];  Service: Urology;  Laterality: Right;   LAPAROSCOPIC VAGINAL HYSTERECTOMY     ORIF WRIST FRACTURE Left 06/23/2018   Procedure: OPEN REDUCTION INTERNAL FIXATION (ORIF) LEFT WRIST FRACTURE;  Surgeon: Renette Butters, MD;  Location: [facility];  Service: Orthopedics;  Laterality: Left;  regional arm block   RECTOCELE REPAIR     TOTAL KNEE ARTHROPLASTY Right     SH: Social History   Tobacco Use   Smoking status: Never   Smokeless tobacco: Never   Tobacco comments:    Never smoked  Vaping Use   Vaping Use: Never used  Substance Use Topics   Alcohol use: No    Alcohol/week: 0.0 standard drinks of alcohol   Drug use: No    ROS: All other review of systems were reviewed and are negative except what is noted above in HPI  PE: BP (!) 149/77   Pulse (!) 116  GENERAL APPEARANCE:  Well appearing, well developed, well nourished, NAD HEENT:  Atraumatic, normocephalic NECK:  Supple. Trachea midline ABDOMEN:  Soft, non-tender, no masses EXTREMITIES:  Moves all extremities wel MENTAL STATUS:  appropriate BACK: No CVAT SKIN:  Warm, dry, and intact   Results: Laboratory Data: Lab Results  Component Value Date   WBC 8.0 05/07/2021   HGB 12.9 05/07/2021   HCT 38.9 05/07/2021   MCV 87 05/07/2021   PLT 247 05/07/2021    Lab Results  Component Value Date   CREATININE 0.67 05/07/2021    No results found for: "PSA"  No results found for: "TESTOSTERONE"  Lab Results  Component Value Date   HGBA1C 6.1 (H) 03/15/2021    Urinalysis    Component Value Date/Time   COLORURINE YELLOW 03/15/2021 1740   APPEARANCEUR Hazy (A) 11/08/2021 1405   LABSPEC 1.015 03/15/2021 1740   PHURINE 5.0 03/15/2021 1740   GLUCOSEU Negative 11/08/2021 1405   HGBUR NEGATIVE 03/15/2021 1740    BILIRUBINUR Negative 11/08/2021 1405   KETONESUR negative 09/09/2021 1356   [place] 03/15/2021 1740   PROTEINUR Negative 11/08/2021 1405   PROTEINUR 30 (A) 03/15/2021 1740   UROBILINOGEN 0.2 09/09/2021 1356   UROBILINOGEN 0.2 04/08/2008 1454   NITRITE Negative 11/08/2021 1405   NITRITE NEGATIVE 03/15/2021 1740   LEUKOCYTESUR Negative 11/08/2021 1405   LEUKOCYTESUR LARGE (A) 03/15/2021 1740    Lab Results  Component Value Date   LABMICR Comment 11/08/2021   WBCUA 0-5 10/10/2021   LABEPIT 0-10 10/10/2021   MUCUS Present 08/29/2021   BACTERIA None seen 10/10/2021    Pertinent Imaging: No results found for this or any previous visit.  No results found for this or any previous visit.  No results found for this or any previous visit.  No results found for this or any previous visit.  Results for orders placed during the hospital encounter of 07/11/21  US RENAL  Narrative CLINICAL DATA:  History of nephrolithiasis.  Follow-up exam.  EXAM: RENAL / URINARY TRACT  ULTRASOUND COMPLETE  COMPARISON:  Ultrasound renal 04/11/2021  FINDINGS: Right Kidney:  Renal measurements: 12.5 x 5.3 x 4.8 cm = volume: 166.2 mL. Echogenicity within normal limits. No mass or hydronephrosis visualized. Superior pole cyst measuring up to 3.5 cm.  Left Kidney:  Renal measurements: 11.3 x 5.7 x 5.0 cm = volume: 169.3 mL. Echogenicity within normal limits. No mass or hydronephrosis visualized. There are multiple cysts measuring up to 3 cm within the superior pole. There are at least 2 echogenic shadowing foci favored to represent stones measuring up to 5 mm.  Bladder:  Appears normal for degree of bladder distention.  Other:  None.  IMPRESSION: No hydronephrosis.  Probable left nephrolithiasis.   Electronically Signed By: Lovey Newcomer M.D. On: 07/12/2021 08:42  No valid procedures specified. No results found for this or any previous visit.  No results found for  this or any previous visit.  No results found for this or any previous visit (from the past 24 hour(s)).

## 2021-11-14 NOTE — Telephone Encounter (Signed)
Caller name:Marquis Zack Seal   On DPR? :No  Call back number:[phone]  Provider they see: Lacinda Axon   Reason for call:Pt is calling to see if she needs to have blood work done she is wanting to get her cholesterol checked

## 2021-11-14 NOTE — Telephone Encounter (Signed)
Patient requesting labs for cholesterol , last OV July 2023 , most recent labs were tsh, lipids, cmp, cbc 04/2021

## 2021-11-15 NOTE — Telephone Encounter (Signed)
Lab orders placed and pt is aware 

## 2021-11-19 DIAGNOSIS — E785 Hyperlipidemia, unspecified: Secondary | ICD-10-CM | POA: Diagnosis not present

## 2021-11-19 DIAGNOSIS — Z79899 Other long term (current) drug therapy: Secondary | ICD-10-CM | POA: Diagnosis not present

## 2021-11-19 DIAGNOSIS — E871 Hypo-osmolality and hyponatremia: Secondary | ICD-10-CM | POA: Diagnosis not present

## 2021-11-19 DIAGNOSIS — E1100 Type 2 diabetes mellitus with hyperosmolarity without nonketotic hyperglycemic-hyperosmolar coma (NKHHC): Secondary | ICD-10-CM | POA: Diagnosis not present

## 2021-11-20 DIAGNOSIS — H34832 Tributary (branch) retinal vein occlusion, left eye, with macular edema: Secondary | ICD-10-CM | POA: Diagnosis not present

## 2021-11-20 LAB — CMP14+EGFR
ALT: 15 IU/L (ref 0–32)
AST: 13 IU/L (ref 0–40)
Albumin/Globulin Ratio: 1.8 (ref 1.2–2.2)
Albumin: 4.4 g/dL (ref 3.7–4.7)
Alkaline Phosphatase: 92 IU/L (ref 44–121)
BUN/Creatinine Ratio: 32 — ABNORMAL HIGH (ref 12–28)
BUN: 27 mg/dL (ref 8–27)
Bilirubin Total: 0.5 mg/dL (ref 0.0–1.2)
CO2: 23 mmol/L (ref 20–29)
Calcium: 10.5 mg/dL — ABNORMAL HIGH (ref 8.7–10.3)
Chloride: 103 mmol/L (ref 96–106)
Creatinine, Ser: 0.84 mg/dL (ref 0.57–1.00)
Globulin, Total: 2.4 g/dL (ref 1.5–4.5)
Glucose: 142 mg/dL — ABNORMAL HIGH (ref 70–99)
Potassium: 5.1 mmol/L (ref 3.5–5.2)
Sodium: 141 mmol/L (ref 134–144)
Total Protein: 6.8 g/dL (ref 6.0–8.5)
eGFR: 68 mL/min/{1.73_m2} (ref >59)

## 2021-11-20 LAB — CBC WITH DIFFERENTIAL/PLATELET
Basophils Absolute: 0.1 10*3/uL (ref 0.0–0.2)
Basos: 1 %
EOS (ABSOLUTE): 0.2 10*3/uL (ref 0.0–0.4)
Eos: 3 %
Hematocrit: 41.9 % (ref 34.0–46.6)
Hemoglobin: 14 g/dL (ref 11.1–15.9)
Immature Grans (Abs): 0 10*3/uL (ref 0.0–0.1)
Immature Granulocytes: 0 %
Lymphocytes Absolute: 1.7 10*3/uL (ref 0.7–3.1)
Lymphs: 25 %
MCH: 29.9 pg (ref 26.6–33.0)
MCHC: 33.4 g/dL (ref 31.5–35.7)
MCV: 89 fL (ref 79–97)
Monocytes Absolute: 0.6 10*3/uL (ref 0.1–0.9)
Monocytes: 9 %
Neutrophils Absolute: 4.1 10*3/uL (ref 1.4–7.0)
Neutrophils: 62 %
Platelets: 264 10*3/uL (ref 150–450)
RBC: 4.69 x10E6/uL (ref 3.77–5.28)
RDW: 14.1 % (ref 11.7–15.4)
WBC: 6.7 10*3/uL (ref 3.4–10.8)

## 2021-11-20 LAB — HEMOGLOBIN A1C
Est. average glucose Bld gHb Est-mCnc: 131 mg/dL
Hgb A1c MFr Bld: 6.2 % — ABNORMAL HIGH (ref 4.8–5.6)

## 2021-11-20 LAB — LIPID PANEL
Chol/HDL Ratio: 2.4 ratio (ref 0.0–4.4)
Cholesterol, Total: 167 mg/dL (ref 100–199)
HDL: 70 mg/dL (ref >39)
LDL Chol Calc (NIH): 83 mg/dL (ref 0–99)
Triglycerides: 71 mg/dL (ref 0–149)
VLDL Cholesterol Cal: 14 mg/dL (ref 5–40)

## 2021-11-20 LAB — TSH: TSH: 2.72 u[IU]/mL (ref 0.450–4.500)

## 2021-11-20 NOTE — Addendum Note (Signed)
Addended by: Dairl Ponder on: 11/20/2021 04:37 PM   Modules accepted: Orders

## 2021-11-26 ENCOUNTER — Other Ambulatory Visit: Payer: Medicare Other

## 2021-11-26 ENCOUNTER — Other Ambulatory Visit: Payer: Self-pay | Admitting: Physician Assistant

## 2021-11-26 ENCOUNTER — Telehealth: Payer: Self-pay

## 2021-11-26 DIAGNOSIS — N3010 Interstitial cystitis (chronic) without hematuria: Secondary | ICD-10-CM | POA: Diagnosis not present

## 2021-11-26 MED ORDER — NITROFURANTOIN MONOHYD MACRO 100 MG PO CAPS: 100.0000 mg | ORAL_CAPSULE | Freq: Two times a day (BID) | ORAL | 0 refills | Status: AC

## 2021-11-26 NOTE — Telephone Encounter (Signed)
Patient called complaining of UTI symptoms. Per PA ok for patient to come drop off urine specimen. Patient place on lab schedule.

## 2021-11-26 NOTE — Progress Notes (Unsigned)
Referring Provider: Tommie Sams, DO Primary Care Physician:  Tommie Sams, DO Primary GI Physician: Dr. Jena Gauss  No chief complaint on file.   HPI:   Deborah Gray is a 88 y.o. female with history of GERD, esophageal spasms, idiopathic chronic pancreatitis with chronic pancreatic exocrine insufficiency, IBS with intermittent diarrhea, hemorrhoids, presenting today for follow-up.  Last seen in our office 08/29/2021.  Reported since her last office visit couple months prior, she had had a couple episodes of abdominal cramping that she was able to stop and Levsin.  July 1 she woke up with several bouts of diarrhea and thought it might be something she ate.  Significant diarrhea and abdominal cramping July 5 with no improvement with Levsin.  In retrospect, felt this was related to things that she ate for the holidays.  She had more starches than normal and also had 2 hotdogs.  Took 2 Imodium.  She was compliant with Creon.  At the time of her office visit, stools had returned to normal.  Recently diagnosed with UTI and started on antibiotics.  Also with increased issues with burning in her chest, mild sensation of food sitting in throat area, rarely using Librax for esophageal spasms. She is taking Pepcid 40 mg in the morning.  Trouble taking half a tablet at bedtime due to size.  Prefer to avoid PPI therapy.  Overall, suspect a recent flare of diarrhea likely secondary to IBS/dietary changes.  Recommended continuing Levsin, Creon, probiotic, Librax as needed, famotidine 20 mg twice daily.  Today:   Past Medical History:  Diagnosis Date   Arthritis    Benign neoplasm of colon    Constipation    Diaphragmatic hernia without mention of obstruction or gangrene    Diverticulosis of colon (without mention of hemorrhage)    Dysphagia, pharyngoesophageal phase    Esophageal reflux    Essential hypertension    Heart murmur    Hemorrhoids    Hyperlipidemia    Internal hemorrhoids without  mention of complication    Left wrist fracture    PONV (postoperative nausea and vomiting)    Stricture and stenosis of esophagus    Type 2 diabetes mellitus (HCC)    Unspecified gastritis and gastroduodenitis without mention of hemorrhage     Past Surgical History:  Procedure Laterality Date   ABDOMINAL HYSTERECTOMY     BACTERIAL OVERGROWTH TEST N/A 09/06/2015   Procedure: BACTERIAL OVERGROWTH TEST;  Surgeon: Corbin Ade, MD;  Location: [facility];  Service: Endoscopy;  Laterality: N/A;  800   BREAST LUMPECTOMY Right    benign   CATARACT EXTRACTION Bilateral 2006   Implants in both, surgeries done 6 weeks apart   CHOLECYSTECTOMY     COLONOSCOPY  03/08/02   Jarold Motto: diverticulosis, internal hemorrhoids, adenomatous colon polyp   COLONOSCOPY  01/23/04   Jarold Motto: diverticulosis, internal hemorrhoids   COLONOSCOPY  09/25/05   Jarold Motto: diverticulosis   COLONOSCOPY  07/05/09   Jarold Motto: severe diverticulosis   COLONOSCOPY  01/21/11   Jarold Motto: severe diverticulosis in sigmoid to desc colon, int hemorrhoids, follow up TCS in 5 years   COLONOSCOPY N/A 04/10/2016   [name]: diverticulosis   CYSTOSCOPY W/ URETERAL STENT PLACEMENT Right 03/15/2021   Procedure: CYSTOSCOPY WITH RETROGRADE PYELOGRAM/URETERAL STENT PLACEMENT;  Surgeon: Jannifer Hick, MD;  Location: [facility];  Service: Urology;  Laterality: Right;   CYSTOSCOPY WITH RETROGRADE PYELOGRAM, URETEROSCOPY AND STENT PLACEMENT Right 03/29/2021   Procedure: CYSTOSCOPY WITH RETROGRADE PYELOGRAM, URETEROSCOPY AND  STENT EXCHANGE;  Surgeon: Cleon Gustin, MD;  Location: [facility];  Service: Urology;  Laterality: Right;   DILATION AND CURETTAGE OF UTERUS     ESOPHAGEAL MANOMETRY  03/21/08   [place]: findings c/w [place] Esophagus   ESOPHAGOGASTRODUODENOSCOPY  03/08/02   [name]: esophageal stricture, chronic gerd, s/p dilation.    ESOPHAGOGASTRODUODENOSCOPY  01/23/04   [name]: esophageal stricture, gastritis, hiatal hernia    ESOPHAGOGASTRODUODENOSCOPY  09/25/05   [name]: gastritis, benign bx, no H.pylori   ESOPHAGOGASTRODUODENOSCOPY  07/05/09   [name]: gastropathy, benign small bowel bx and gastric bx   ESOPHAGOGASTRODUODENOSCOPY N/A 05/15/2015   Dr. Gala Romney- diffuse moderate inflammation characterized by congestion (edema), erythema, and linear erosions was found in the entire examined stomach. bx= reactive gastropathy   FOOT SURGERY Left    HEMORRHOID BANDING  2017   Dr.[name]   HOLMIUM LASER APPLICATION Right 08/20/2200   Procedure: HOLMIUM LASER APPLICATION;  Surgeon: Cleon Gustin, MD;  Location: [facility];  Service: Urology;  Laterality: Right;   LAPAROSCOPIC VAGINAL HYSTERECTOMY     ORIF WRIST FRACTURE Left 06/23/2018   Procedure: OPEN REDUCTION INTERNAL FIXATION (ORIF) LEFT WRIST FRACTURE;  Surgeon: Renette Butters, MD;  Location: [facility];  Service: Orthopedics;  Laterality: Left;  regional arm block   RECTOCELE REPAIR     TOTAL KNEE ARTHROPLASTY Right     Current Outpatient Medications  Medication Sig Dispense Refill   acetaminophen (TYLENOL 8 HOUR) 650 MG CR tablet Take 1 tablet (650 mg total) by mouth every 8 (eight) hours as needed for pain. 30 tablet 0   aspirin EC 81 MG tablet Take 81 mg by mouth at bedtime.     Azelastine-Fluticasone 137-50 MCG/ACT SUSP Place 1 spray into the nose every 12 (twelve) hours. 23 g 0   baclofen (LIORESAL) 10 MG tablet TAKE 0.5-1 TABLETS (5-10 MG TOTAL) BY MOUTH 2 (TWO) TIMES DAILY AS NEEDED FOR MUSCLE SPASMS (BACK PAIN). (Patient not taking: Reported on 11/08/2021) 20 tablet 0   blood glucose meter kit and supplies KIT Dispense based on  insurance preference. Tests once a day E11.9 1 each 0   Carboxymethylcellulose Sodium (THERATEARS) 0.25 % SOLN Place 1 drop into both eyes 2 (two) times daily.     cholecalciferol (VITAMIN D3) 25 MCG (1000 UNIT) tablet Take 1,000 Units by mouth daily.     clidinium-chlordiazePOXIDE (LIBRAX) 5-2.5 MG capsule Take 1  capsule by mouth 3 (three) times daily as needed (for esophageal symptoms). 90 capsule 3   doxycycline (VIBRAMYCIN) 100 MG capsule TAKE 1 CAPSULE BY MOUTH EVERY 12 HOURS (Patient not taking: Reported on 11/08/2021) 14 capsule 0   ezetimibe (ZETIA) 10 MG tablet TAKE 1 TABLET BY MOUTH EVERY DAY 90 tablet 1   famotidine (PEPCID) 20 MG tablet Take $RemoveBef'20mg'ZruMyxJAPQ$  before breakfast and $RemoveBefor'20mg'wHWrZBqPmOvZ$  before supper. 60 tablet 5   HYDROcodone-acetaminophen (NORCO/VICODIN) 5-325 MG tablet Take 1 tablet by mouth every 8 (eight) hours as needed for moderate pain or severe pain. 10 tablet 0   hyoscyamine (LEVSIN SL) 0.125 MG SL tablet USE 1 TABLET UNDER THE TONGUE BEFORE MEALS AND AT BEDTIME AS NEEDED FOR FLARES IN SYMPTOMS, DIARRHEA/ABDOMINAL CRAMPING 90 tablet 3   ketoconazole (NIZORAL) 2 % cream Apply 1 application topically daily as needed for irritation (to affected area. external use only). (Patient taking differently: Apply 1 application  topically daily.) 30 g 0   losartan (COZAAR) 50 MG tablet Take 1 tablet (50 mg total) by mouth daily. 90 tablet 3  meclizine (ANTIVERT) 25 MG tablet Take 1 tablet (25 mg total) by mouth 3 (three) times daily as needed. 30 tablet 1   metFORMIN (GLUCOPHAGE) 500 MG tablet TAKE ONE TABLET BY MOUTH EVERY MORNING ONE AT LUNCH AND 2 AT BEDTIME 360 tablet 1   methenamine (HIPREX) 1 g tablet TAKE 1 TABLET BY MOUTH 2 TIMES DAILY. 180 tablet 3   mirabegron ER (MYRBETRIQ) 50 MG TB24 tablet Take 1 tablet (50 mg total) by mouth daily. 90 tablet 3   Multiple Vitamins-Minerals (PRESERVISION AREDS 2) CAPS Take 1 capsule by mouth 2 (two) times daily.      nateglinide (STARLIX) 120 MG tablet TAKE 1 TABLET (120 MG TOTAL) BY MOUTH 3 (THREE) TIMES DAILY WITH MEALS 270 tablet 1   omega-3 acid ethyl esters (LOVAZA) 1 g capsule Take 2 g by mouth daily.     Pancrelipase, Lip-Prot-Amyl, (CREON) 24000-76000 units CPEP TAKE 3 CAPSULES THREE TIMES DAILY WITH MEALS AND ONE CAPSULE WITH SNACK TWICE DAILY 1000 capsule 5    Vitamin Mixture (ESTER-C) 500-60 MG TABS Take 1 tablet by mouth daily.      No current facility-administered medications for this visit.    Allergies as of 11/28/2021 - Review Complete 11/14/2021  Allergen Reaction Noted   Adhesive [tape] Other (See Comments) 07/21/2016   Estrogens Other (See Comments) 10/15/2012   Sulfonamide derivatives Rash 03/01/2008    Family History  Problem Relation Age of Onset   Ovarian cancer Mother    Diabetes Father    Heart disease Father    Breast cancer Sister    Liver cancer Sister    Colon cancer Cousin        Paternal side   Diabetes Maternal Aunt    Liver disease Cousin        Maternal side, never drank    Social History   Socioeconomic History   Marital status: Widowed    Spouse name: Not on file   Number of children: 3   Years of education: Not on file   Highest education level: Not on file  Occupational History   Occupation: Retired  Tobacco Use   Smoking status: Never   Smokeless tobacco: Never   Tobacco comments:    Never smoked  Vaping Use   Vaping Use: Never used  Substance and Sexual Activity   Alcohol use: No    Alcohol/week: 0.0 standard drinks of alcohol   Drug use: No   Sexual activity: Not Currently  Other Topics Concern   Not on file  Social History Narrative   Widowed since 07/2018. Married x 61 years.   3 sons. Blende and 1966.   Social Determinants of Health   Financial Resource Strain: Low Risk  (05/15/2021)   Overall Financial Resource Strain (CARDIA)    Difficulty of Paying Living Expenses: Not hard at all  Food Insecurity: No Food Insecurity (05/15/2021)   Hunger Vital Sign    Worried About Running Out of Food in the Last Year: Never true    Ran Out of Food in the Last Year: Never true  Transportation Needs: No Transportation Needs (05/15/2021)   PRAPARE - Hydrologist (Medical): No    Lack of Transportation (Non-Medical): No  Physical Activity: Insufficiently Active  (05/15/2021)   Exercise Vital Sign    Days of Exercise per Week: 5 days    Minutes of Exercise per Session: 20 min  Stress: No Stress Concern Present (05/15/2021)   [employer] of  Occupational Health - Occupational Stress Questionnaire    Feeling of Stress : Not at all  Social Connections: Moderately Integrated (05/15/2021)   Social Connection and Isolation Panel [NHANES]    Frequency of Communication with Friends and Family: More than three times a week    Frequency of Social Gatherings with Friends and Family: More than three times a week    Attends Religious Services: More than 4 times per year    Active Member of [employer] or Organizations: Yes    Attends Archivist Meetings: More than 4 times per year    Marital Status: Widowed    Review of Systems: Gen: Denies fever, chills, cold or flu like symptoms, pre-syncope, or syncope.   CV: Denies chest pain, palpitations. Resp: Denies dyspnea, cough.  GI: See HPI Heme: See HPI  Physical Exam: There were no vitals taken for this visit. General:   Alert and oriented. No distress noted. Pleasant and cooperative.  Head:  Normocephalic and atraumatic. Eyes:  Conjuctiva clear without scleral icterus. Heart:  S1, S2 present without murmurs appreciated. Lungs:  Clear to auscultation bilaterally. No wheezes, rales, or rhonchi. No distress.  Abdomen:  +BS, soft, non-tender and non-distended. No rebound or guarding. No HSM or masses noted. Msk:  Symmetrical without gross deformities. Normal posture. Extremities:  Without edema. Neurologic:  Alert and  oriented x4 Psych:  Normal mood and affect.    Assessment:     Plan:  ***   Aliene Altes, PA-C [facility] [facility] 11/28/2021

## 2021-11-27 ENCOUNTER — Other Ambulatory Visit (INDEPENDENT_AMBULATORY_CARE_PROVIDER_SITE_OTHER): Payer: Medicare Other | Admitting: *Deleted

## 2021-11-27 DIAGNOSIS — Z23 Encounter for immunization: Secondary | ICD-10-CM

## 2021-11-27 LAB — MICROSCOPIC EXAMINATION: WBC, UA: >30 /hpf — AB (ref 0–5)

## 2021-11-27 LAB — URINALYSIS, ROUTINE W REFLEX MICROSCOPIC
Bilirubin, UA: NEGATIVE
Glucose, UA: NEGATIVE
Nitrite, UA: POSITIVE — AB
Specific Gravity, UA: 1.02 (ref 1.005–1.030)
Urobilinogen, Ur: 0.2 mg/dL (ref 0.2–1.0)
pH, UA: 5.5 (ref 5.0–7.5)

## 2021-11-28 ENCOUNTER — Encounter: Payer: Self-pay | Admitting: Gastroenterology

## 2021-11-28 ENCOUNTER — Telehealth: Payer: Self-pay | Admitting: *Deleted

## 2021-11-28 ENCOUNTER — Ambulatory Visit (INDEPENDENT_AMBULATORY_CARE_PROVIDER_SITE_OTHER): Payer: Medicare Other | Admitting: Gastroenterology

## 2021-11-28 ENCOUNTER — Ambulatory Visit: Payer: Medicare Other | Admitting: Gastroenterology

## 2021-11-28 VITALS — BP 161/70 | HR 92 | Temp 97.8°F | Ht 64.0 in | Wt 136.2 lb

## 2021-11-28 DIAGNOSIS — K861 Other chronic pancreatitis: Secondary | ICD-10-CM

## 2021-11-28 DIAGNOSIS — K224 Dyskinesia of esophagus: Secondary | ICD-10-CM | POA: Diagnosis not present

## 2021-11-28 DIAGNOSIS — K219 Gastro-esophageal reflux disease without esophagitis: Secondary | ICD-10-CM | POA: Diagnosis not present

## 2021-11-28 DIAGNOSIS — R197 Diarrhea, unspecified: Secondary | ICD-10-CM | POA: Diagnosis not present

## 2021-11-28 DIAGNOSIS — R1310 Dysphagia, unspecified: Secondary | ICD-10-CM

## 2021-11-28 NOTE — Patient Instructions (Addendum)
We will arrange for you to have a swallow study at [facility].   Continue taking Librax as needed for your esophageal spasms.   You can also try taking two Altoids mints taken sublingually before each meal.   Eat slowly, take small bites, chew thoroughly, drink plenty of liquids throughout meals.   Continue famotidine 20 mg twice daily.   Continue Mylanta as needed for breakthrough reflux.   Follow a GERD diet:  Avoid fried, fatty, greasy, spicy, citrus foods. Avoid caffeine and carbonated beverages. Avoid chocolate. Try eating 4-6 small meals a day rather than 3 large meals. Do not eat within 3 hours of laying down. Prop head of bed up on wood or bricks to create a 6 inch incline.  Continue Levsin as needed for abdominal cramping or loose stools.   Continue imodium as needed.   Continue Creon 3 capsules with meals and 1 with snacks.   We will see you back in about 3 months. Do not hesitate to call sooner if needed.   It was good to meet you today!   Aliene Altes, PA-C [facility]

## 2021-11-28 NOTE — Telephone Encounter (Signed)
Spoke with pt. Aware of BPE appt details.

## 2021-11-29 ENCOUNTER — Telehealth: Payer: Self-pay

## 2021-11-29 LAB — URINE CULTURE

## 2021-11-29 NOTE — Telephone Encounter (Signed)
Patient called office today to inquire about urine culture. Patient was notified of recommendations by staff. Voiced understanding.

## 2021-11-29 NOTE — Telephone Encounter (Signed)
-----   Message from Reynaldo Minium, [place] sent at 11/29/2021  2:19 PM EDT ----- Please let Ms Jepsen know her cx indicates Macrobid should cover current UTI. Once she is done, she should go back on her HS suppression dose. ----- Message ----- From: Interface, [facility] Lab Results In Sent: 11/27/2021   5:37 AM EDT To: Reynaldo Minium, PA-C

## 2021-12-01 ENCOUNTER — Other Ambulatory Visit: Payer: Self-pay | Admitting: Gastroenterology

## 2021-12-03 ENCOUNTER — Ambulatory Visit (HOSPITAL_COMMUNITY)
Admission: RE | Admit: 2021-12-03 | Discharge: 2021-12-03 | Disposition: A | Discharge status: Home / Self Care | Payer: Medicare Other | Source: Ambulatory Visit | Attending: Gastroenterology | Admitting: Gastroenterology

## 2021-12-03 DIAGNOSIS — K224 Dyskinesia of esophagus: Secondary | ICD-10-CM | POA: Diagnosis not present

## 2021-12-03 DIAGNOSIS — R1310 Dysphagia, unspecified: Secondary | ICD-10-CM | POA: Insufficient documentation

## 2021-12-04 ENCOUNTER — Encounter: Payer: Self-pay | Admitting: *Deleted

## 2021-12-06 ENCOUNTER — Ambulatory Visit (INDEPENDENT_AMBULATORY_CARE_PROVIDER_SITE_OTHER): Payer: Medicare Other | Admitting: Urology

## 2021-12-06 ENCOUNTER — Encounter: Payer: Self-pay | Admitting: Urology

## 2021-12-06 DIAGNOSIS — N3010 Interstitial cystitis (chronic) without hematuria: Secondary | ICD-10-CM | POA: Diagnosis not present

## 2021-12-06 LAB — URINALYSIS, ROUTINE W REFLEX MICROSCOPIC
Bilirubin, UA: NEGATIVE
Glucose, UA: NEGATIVE
Ketones, UA: NEGATIVE
Leukocytes,UA: NEGATIVE
Nitrite, UA: NEGATIVE
Protein,UA: NEGATIVE
RBC, UA: NEGATIVE
Specific Gravity, UA: 1.015 (ref 1.005–1.030)
Urobilinogen, Ur: 0.2 mg/dL (ref 0.2–1.0)
pH, UA: 7 (ref 5.0–7.5)

## 2021-12-06 MED ORDER — HYDROCORTISONE SOD SUC (PF) 100 MG IJ SOLR
100.0000 mg | Freq: Once | INTRAMUSCULAR | Status: AC
  Administered 2021-12-06: 100 mg

## 2021-12-06 MED ORDER — DIMETHYL SULFOXIDE 50 % IS SOLN
50.0000 mL | Freq: Once | INTRAVESICAL | Status: AC
  Administered 2021-12-06: 50 mL via URETHRAL

## 2021-12-06 MED ORDER — SODIUM BICARBONATE 8.4 % IV SOLN
50.0000 meq | Freq: Once | INTRAVENOUS | Status: AC
  Administered 2021-12-06: 50 meq

## 2021-12-06 MED ORDER — LIDOCAINE HCL 2 % IJ SOLN
10.0000 mL | Freq: Once | INTRAMUSCULAR | Status: AC
  Administered 2021-12-06: 200 mg

## 2021-12-06 NOTE — Progress Notes (Signed)
Bladder Instillation DMSO  Due to IC patient is present today for a Bladder Instillation of DMSO treatment. Patient was cleaned and prepped in a sterile fashion with betadine.  A 20FR catheter was inserted, urine return was noted 250m, urine was yellow in color.  DMSO treatment was instilled into the bladder. The catheter was then removed. Patient tolerated well, no complications were noted pt was instructed to hold the instillation for 20 minutes and then will void it out. Pt voiced understanding.    Performed by: Hope LPN  Follow up/ Additional notes: Keep scheduled NV

## 2021-12-16 ENCOUNTER — Encounter (HOSPITAL_COMMUNITY): Payer: Self-pay | Admitting: Anesthesiology

## 2021-12-17 ENCOUNTER — Encounter (HOSPITAL_COMMUNITY): Admission: RE | Payer: Self-pay | Source: Home / Self Care

## 2021-12-17 ENCOUNTER — Ambulatory Visit (HOSPITAL_COMMUNITY): Admission: RE | Admit: 2021-12-17 | Payer: Medicare Other | Source: Home / Self Care | Admitting: Internal Medicine

## 2021-12-17 ENCOUNTER — Telehealth: Payer: Self-pay | Admitting: Internal Medicine

## 2021-12-17 DIAGNOSIS — R1310 Dysphagia, unspecified: Secondary | ICD-10-CM

## 2021-12-17 SURGERY — ESOPHAGOGASTRODUODENOSCOPY (EGD) WITH PROPOFOL
Anesthesia: Monitor Anesthesia Care

## 2021-12-17 NOTE — Telephone Encounter (Signed)
Pt says she has been exposed to Deborah Gray. She want to cancel appt for today. Advised pt that Dr.[name] is booking into December, but don't have his schedule for December yet, will call when get's schedule. She prefers a Wednesday because son will have to drive her.

## 2021-12-17 NOTE — Telephone Encounter (Signed)
Pt needs to reschedule her procedure for today with Dr Gala Romney due to being exposed to covid, but tested negative. [phone]

## 2021-12-18 DIAGNOSIS — E1142 Type 2 diabetes mellitus with diabetic polyneuropathy: Secondary | ICD-10-CM | POA: Diagnosis not present

## 2021-12-18 DIAGNOSIS — L851 Acquired keratosis [keratoderma] palmaris et plantaris: Secondary | ICD-10-CM | POA: Diagnosis not present

## 2021-12-19 ENCOUNTER — Other Ambulatory Visit: Payer: Medicare Other

## 2021-12-19 DIAGNOSIS — Z87440 Personal history of urinary (tract) infections: Secondary | ICD-10-CM

## 2021-12-19 MED ORDER — CEFUROXIME AXETIL 250 MG PO TABS: 250.0000 mg | ORAL_TABLET | Freq: Two times a day (BID) | ORAL | 0 refills | Status: DC

## 2021-12-19 NOTE — Progress Notes (Unsigned)
Patient in office complaining of uti symptoms. Urine sent for culture. Patient started on Ceftin per Dr. Alyson Ingles.  Patient in office and made aware.

## 2021-12-20 LAB — URINALYSIS, ROUTINE W REFLEX MICROSCOPIC
Bilirubin, UA: NEGATIVE
Glucose, UA: NEGATIVE
Nitrite, UA: POSITIVE — AB
Specific Gravity, UA: 1.015 (ref 1.005–1.030)
Urobilinogen, Ur: 0.2 mg/dL (ref 0.2–1.0)
pH, UA: 6 (ref 5.0–7.5)

## 2021-12-20 LAB — MICROSCOPIC EXAMINATION
RBC, Urine: NONE SEEN /hpf (ref 0–2)
WBC, UA: >30 /hpf — ABNORMAL HIGH (ref 0–5)

## 2021-12-22 LAB — URINE CULTURE

## 2021-12-25 NOTE — Telephone Encounter (Signed)
Called pt. She needed an asap appt bc food is continue to bother her. Scheduled her for 11/15 at 10:30am. Aware she will get pre-op phone call prior. Discussed EGD instructions in detail with pt. She voiced understanding.she had prior instructions and just changed times/dates on it.

## 2021-12-26 DIAGNOSIS — Z1283 Encounter for screening for malignant neoplasm of skin: Secondary | ICD-10-CM | POA: Diagnosis not present

## 2021-12-26 DIAGNOSIS — L304 Erythema intertrigo: Secondary | ICD-10-CM | POA: Diagnosis not present

## 2021-12-26 DIAGNOSIS — D239 Other benign neoplasm of skin, unspecified: Secondary | ICD-10-CM | POA: Diagnosis not present

## 2021-12-26 DIAGNOSIS — L218 Other seborrheic dermatitis: Secondary | ICD-10-CM | POA: Diagnosis not present

## 2021-12-31 ENCOUNTER — Encounter (HOSPITAL_COMMUNITY)
Admission: RE | Admit: 2021-12-31 | Discharge: 2021-12-31 | Disposition: A | Discharge status: Home / Self Care | Payer: Medicare Other | Source: Ambulatory Visit | Attending: Internal Medicine | Admitting: Internal Medicine

## 2022-01-01 ENCOUNTER — Ambulatory Visit (INDEPENDENT_AMBULATORY_CARE_PROVIDER_SITE_OTHER): Payer: Medicare Other | Admitting: Urology

## 2022-01-01 ENCOUNTER — Other Ambulatory Visit: Payer: Self-pay

## 2022-01-01 ENCOUNTER — Encounter (HOSPITAL_COMMUNITY): Payer: Self-pay

## 2022-01-01 DIAGNOSIS — N3010 Interstitial cystitis (chronic) without hematuria: Secondary | ICD-10-CM | POA: Diagnosis not present

## 2022-01-01 MED ORDER — SODIUM BICARBONATE 8.4 % IV SOLN
50.0000 meq | Freq: Once | INTRAVENOUS | Status: AC
  Administered 2022-01-01: 50 meq

## 2022-01-01 MED ORDER — HYDROCORTISONE SOD SUC (PF) 100 MG IJ SOLR
100.0000 mg | Freq: Once | INTRAMUSCULAR | Status: AC
  Administered 2022-01-01: 100 mg

## 2022-01-01 MED ORDER — DIMETHYL SULFOXIDE 50 % IS SOLN
50.0000 mL | Freq: Once | INTRAVESICAL | Status: AC
  Administered 2022-01-01: 50 mL via URETHRAL

## 2022-01-01 MED ORDER — LIDOCAINE HCL 2 % IJ SOLN
10.0000 mL | Freq: Once | INTRAMUSCULAR | Status: AC
  Administered 2022-01-01: 200 mg

## 2022-01-01 NOTE — Progress Notes (Cosign Needed)
Bladder Instillation DMSO  Due to ic patient is present today for a Bladder Instillation of DMSO treatment. Patient was cleaned and prepped in a sterile fashion with betadine.  A 20FR catheter was inserted, urine return was noted 178m, urine was YELLOW in color.  DMSO treatment was instilled into the bladder. The catheter was then removed. Patient tolerated well, no complications were noted pt was instructed to hold the instillation for 20 minutes and then will void it out. Pt voiced understanding.    Performed by: Nefertari Rebman LPN  Follow up/ Additional notes: 1 month DMSO tx

## 2022-01-02 ENCOUNTER — Ambulatory Visit (HOSPITAL_COMMUNITY): Payer: Medicare Other | Admitting: Anesthesiology

## 2022-01-02 ENCOUNTER — Ambulatory Visit (HOSPITAL_BASED_OUTPATIENT_CLINIC_OR_DEPARTMENT_OTHER): Payer: Medicare Other | Admitting: Anesthesiology

## 2022-01-02 ENCOUNTER — Other Ambulatory Visit: Payer: Self-pay

## 2022-01-02 ENCOUNTER — Telehealth: Payer: Self-pay

## 2022-01-02 ENCOUNTER — Ambulatory Visit (HOSPITAL_COMMUNITY)
Admission: RE | Admit: 2022-01-02 | Discharge: 2022-01-02 | Disposition: A | Discharge status: Home / Self Care | Payer: Medicare Other | Attending: Internal Medicine | Admitting: Internal Medicine

## 2022-01-02 ENCOUNTER — Encounter (HOSPITAL_COMMUNITY): Payer: Self-pay | Admitting: Internal Medicine

## 2022-01-02 ENCOUNTER — Encounter (HOSPITAL_COMMUNITY)
Admission: RE | Disposition: A | Discharge status: Home / Self Care | Payer: Self-pay | Source: Home / Self Care | Attending: Internal Medicine

## 2022-01-02 ENCOUNTER — Ambulatory Visit: Payer: Medicare Other

## 2022-01-02 DIAGNOSIS — K449 Diaphragmatic hernia without obstruction or gangrene: Secondary | ICD-10-CM

## 2022-01-02 DIAGNOSIS — K222 Esophageal obstruction: Secondary | ICD-10-CM

## 2022-01-02 DIAGNOSIS — K219 Gastro-esophageal reflux disease without esophagitis: Secondary | ICD-10-CM | POA: Insufficient documentation

## 2022-01-02 DIAGNOSIS — K2950 Unspecified chronic gastritis without bleeding: Secondary | ICD-10-CM | POA: Diagnosis not present

## 2022-01-02 DIAGNOSIS — R1310 Dysphagia, unspecified: Secondary | ICD-10-CM | POA: Diagnosis not present

## 2022-01-02 DIAGNOSIS — I10 Essential (primary) hypertension: Secondary | ICD-10-CM | POA: Insufficient documentation

## 2022-01-02 DIAGNOSIS — K319 Disease of stomach and duodenum, unspecified: Secondary | ICD-10-CM | POA: Diagnosis not present

## 2022-01-02 DIAGNOSIS — K2210 Ulcer of esophagus without bleeding: Secondary | ICD-10-CM | POA: Diagnosis not present

## 2022-01-02 DIAGNOSIS — K2100 Gastro-esophageal reflux disease with esophagitis, without bleeding: Secondary | ICD-10-CM

## 2022-01-02 HISTORY — PX: BIOPSY: SHX5522

## 2022-01-02 HISTORY — PX: ESOPHAGOGASTRODUODENOSCOPY (EGD) WITH PROPOFOL: SHX5813

## 2022-01-02 HISTORY — PX: MALONEY DILATION: SHX5535

## 2022-01-02 LAB — URINALYSIS, ROUTINE W REFLEX MICROSCOPIC
Bilirubin, UA: NEGATIVE
Glucose, UA: NEGATIVE
Ketones, UA: NEGATIVE
Leukocytes,UA: NEGATIVE
Nitrite, UA: NEGATIVE
Protein,UA: NEGATIVE
RBC, UA: NEGATIVE
Specific Gravity, UA: 1.015 (ref 1.005–1.030)
Urobilinogen, Ur: 0.2 mg/dL (ref 0.2–1.0)
pH, UA: 6.5 (ref 5.0–7.5)

## 2022-01-02 LAB — GLUCOSE, CAPILLARY: Glucose-Capillary: 109 mg/dL — ABNORMAL HIGH (ref 70–99)

## 2022-01-02 SURGERY — ESOPHAGOGASTRODUODENOSCOPY (EGD) WITH PROPOFOL
Anesthesia: General

## 2022-01-02 MED ORDER — PHENYLEPHRINE 80 MCG/ML (10ML) SYRINGE FOR IV PUSH (FOR BLOOD PRESSURE SUPPORT)
PREFILLED_SYRINGE | INTRAVENOUS | Status: DC | PRN
  Administered 2022-01-02: 40 ug via INTRAVENOUS

## 2022-01-02 MED ORDER — PROPOFOL 10 MG/ML IV BOLUS
INTRAVENOUS | Status: DC | PRN
  Administered 2022-01-02: 30 mg via INTRAVENOUS

## 2022-01-02 MED ORDER — LACTATED RINGERS IV SOLN: INTRAVENOUS | Status: DC

## 2022-01-02 MED ORDER — LACTATED RINGERS IV SOLN: INTRAVENOUS | Status: DC | PRN

## 2022-01-02 MED ORDER — RABEPRAZOLE SODIUM 20 MG PO TBEC: 20.0000 mg | DELAYED_RELEASE_TABLET | Freq: Every day | ORAL | 11 refills | Status: DC

## 2022-01-02 MED ORDER — PROPOFOL 500 MG/50ML IV EMUL
INTRAVENOUS | Status: DC | PRN
  Administered 2022-01-02: 150 ug/kg/min via INTRAVENOUS

## 2022-01-02 NOTE — Op Note (Signed)
[facility] Patient Name: [place] Procedure Date: 01/02/2022 10:45 AM MRN: [id number] Date of Birth: 1933-02-22 Attending MD: Norvel Richards , MD, [id number] CSN: [id number] Age: 88 Admit Type: Outpatient Procedure:                Upper GI endoscopy Indications:              Dysphagia Providers:                Norvel Richards, MD, Ladoris Gene Technician,                            Technician, [place] Page Referring MD:              Medicines:                Propofol per Anesthesia Complications:            No immediate complications. Estimated Blood Loss:     Estimated blood loss was minimal. Procedure:                Pre-Anesthesia Assessment:                           - Prior to the procedure, a History and Physical                            was performed, and patient medications and                            allergies were reviewed. The patient's tolerance of                            previous anesthesia was also reviewed. The risks                            and benefits of the procedure and the sedation                            options and risks were discussed with the patient.                            All questions were answered, and informed consent                            was obtained. Prior Anticoagulants: The patient has                            taken no anticoagulant or antiplatelet agents. ASA                            Grade Assessment: III - A patient with severe                            systemic disease. After reviewing the risks and  benefits, the patient was deemed in satisfactory                            condition to undergo the procedure.                           After obtaining informed consent, the endoscope was                            passed under direct vision. Throughout the                            procedure, the patient's blood pressure, pulse, and                            oxygen  saturations were monitored continuously. The                            GIF-H190 ([id number]) scope was introduced through the                            mouth, and advanced to the second part of duodenum.                            The upper GI endoscopy was accomplished without                            difficulty. The patient tolerated the procedure                            well. Scope In: 11:15:22 AM Scope Out: 11:24:14 AM Total Procedure Duration: 0 hours 8 minutes 52 seconds  Findings:      Distal esophageal ring where with a 1.5 cm linear distal esophageal       erosion straddling it. No tumor no Barrett's epithelium seen; the       caliber of the tubular esophagus appeared somewhat narrowed. This adult       scope was advanced through it with minimal resistance. There were no       longitudinal furrows evidence of eosinophilic abscesses or feline       appearance. Gastric cavity empty. Gastric friability erosions and       erythema characterized the entire gastric mucosa. No ulcer or       infiltrating process observed. Pylorus was patent.      The duodenal bulb and second portion of the duodenum were normal. Scope       was withdrawn. A 50 French Maloney dilators passed full insertion with       mild resistance. A look back revealed the ring had been disrupted nicely       -there appeared to be 2 tandem rings just above the GE junction which       were also disrupted. There was minimal bleeding. Finally, biopsies of       mid and distal esophagus were taken for histologic study. Biopsies of       the antrum and body were also taken. Impression:               -  Distal esophageal rings?"2 tandem. Overlying                            distal esophageal erosion consistent with erosive                            reflux esophagitis. Status post dilation. Status                            post esophageal biopsy as described                           -Inflamed appearing stomach of  uncertain                            significance diffusely status post biopsy normal                            duodenal bulb and second portion of the duodenum.                           Patient not on any meaningful acid suppression                            therapy. She takes pancreatic enzymes as well.                            Concerns about dementia risk with PPIs previously.                            In this setting, the benefits of acid suppression                            therapy with a PPI far outweighs any theoretical                            risks of dementia. Moderate Sedation:      Moderate (conscious) sedation was personally administered by an       anesthesia professional. The following parameters were monitored: oxygen       saturation, heart rate, blood pressure, respiratory rate, EKG, adequacy       of pulmonary ventilation, and response to care. Recommendation:           - Patient has a contact number available for                            emergencies. The signs and symptoms of potential                            delayed complications were discussed with the                            patient. Return to normal activities tomorrow.  Written discharge instructions were provided to the                            patient.                           - Advance diet as tolerated. Begin rabeprazole to                            20 mg daily take 30 minutes before breakfast. New                            prescription provided to the pharmacy from my                            office. Follow-up on pathology.                           -Office visit with me in 6 weeks. Procedure Code(s):        --- Professional ---                           [phone], Esophagogastroduodenoscopy, flexible,                            transoral; diagnostic, including collection of                            specimen(s) by brushing or washing, when performed                             (separate procedure) Diagnosis Code(s):        --- Professional ---                           R13.10, Dysphagia, unspecified CPT copyright 2022 American Medical Association. All rights reserved. The codes documented in this report are preliminary and upon coder review may  be revised to meet current compliance requirements. Cristopher Estimable. Vidit Boissonneault, MD Norvel Richards, MD 01/02/2022 11:35:28 AM This report has been signed electronically. Number of Addenda: 0

## 2022-01-02 NOTE — Discharge Instructions (Addendum)
EGD Discharge instructions Please read the instructions outlined below and refer to this sheet in the next few weeks. These discharge instructions provide you with general information on caring for yourself after you leave the hospital. Your doctor may also give you specific instructions. While your treatment has been planned according to the most current medical practices available, unavoidable complications occasionally occur. If you have any problems or questions after discharge, please call your doctor. ACTIVITY You may resume your regular activity but move at a slower pace for the next 24 hours.  Take frequent rest periods for the next 24 hours.  Walking will help expel (get rid of) the air and reduce the bloated feeling in your abdomen.  No driving for 24 hours (because of the anesthesia (medicine) used during the test).  You may shower.  Do not sign any important legal documents or operate any machinery for 24 hours (because of the anesthesia used during the test).  NUTRITION Drink plenty of fluids.  You may resume your normal diet.  Begin with a light meal and progress to your normal diet.  Avoid alcoholic beverages for 24 hours or as instructed by your caregiver.  MEDICATIONS You may resume your normal medications unless your caregiver tells you otherwise.  WHAT YOU CAN EXPECT TODAY You may experience abdominal discomfort such as a feeling of fullness or "gas" pains.  FOLLOW-UP Your doctor will discuss the results of your test with you.  SEEK IMMEDIATE MEDICAL ATTENTION IF ANY OF THE FOLLOWING OCCUR: Excessive nausea (feeling sick to your stomach) and/or vomiting.  Severe abdominal pain and distention (swelling).  Trouble swallowing.  Temperature over 101 F (37.8 C).  Rectal bleeding or vomiting of blood.       Your esophagus was inflamed from acid reflux.  It was stretched.  Your stomach was also inflamed.  Biopsies been taken.    You need to be on a better acid blocker  medication   new prescription for Aciphex or rabeprazole 20 mg pill - 1 daily each morning 30 minutes before breakfast.  A new prescription will be   sent to your pharmacy from our office.  In 6 weeks.    At patient request, I called [place]  at [phone]  -

## 2022-01-02 NOTE — Anesthesia Postprocedure Evaluation (Signed)
Anesthesia Post Note  Patient: Deborah Gray  Procedure(s) Performed: ESOPHAGOGASTRODUODENOSCOPY (EGD) WITH PROPOFOL [place]  Patient location during evaluation: Phase II Anesthesia Type: General Level of consciousness: awake and alert Pain management: pain level controlled Vital Signs Assessment: post-procedure vital signs reviewed and stable Respiratory status: spontaneous breathing, nonlabored ventilation, respiratory function stable and patient connected to nasal cannula oxygen Cardiovascular status: blood pressure returned to baseline and stable Postop Assessment: no apparent nausea or vomiting Anesthetic complications: no   No notable events documented.   Last Vitals:  Vitals:   01/02/22 0944 01/02/22 1132  BP: 138/89 (!) 105/45  Pulse: 82 81  Resp: 15 (!) 22  Temp: 37.1 C 36.7 C  SpO2: (!) 16% (!) 20%    Last Pain:  Vitals:   01/02/22 1132  TempSrc: Oral  PainSc: 0-No pain                 Lania Zawistowski Clyde Canterbury

## 2022-01-02 NOTE — Telephone Encounter (Signed)
-----   Message from Daneil Dolin, MD sent at 01/02/2022 11:27 AM EST -----  EGD today.  Needs new prescription for Aciphex 20 mg pill.  Dispense 30 with 11 refills.  Take 1 each morning 30 minutes before breakfast.  Thanks.

## 2022-01-02 NOTE — H&P (Signed)
_0 @   Primary Care Physician:  Coral Spikes, DO Primary Gastroenterologist:  Dr. Gala Romney  Pre-Procedure History & Physical: HPI:  Deborah Gray is a 88 y.o. female here for   Further evaluation of dysphagia.  History of spastic esophageal motility disorder.  Recent BPE demonstrated what appeared to be a peptic type stricture at the GE junction.  Pill hung at this level.  Has been on no meaningful PPI therapy.  Concerns for dementia with agents in the past.  Has not tried rabeprazole.  Only on famotidine sporadically.  Past Medical History:  Diagnosis Date   Arthritis    Benign neoplasm of colon    Constipation    Diaphragmatic hernia without mention of obstruction or gangrene    Diverticulosis of colon (without mention of hemorrhage)    Dysphagia, pharyngoesophageal phase    Esophageal reflux    Essential hypertension    Heart murmur    Hemorrhoids    Hyperlipidemia    Internal hemorrhoids without mention of complication    Left wrist fracture    PONV (postoperative nausea and vomiting)    Stricture and stenosis of esophagus    Type 2 diabetes mellitus (HCC)    Unspecified gastritis and gastroduodenitis without mention of hemorrhage     Past Surgical History:  Procedure Laterality Date   ABDOMINAL HYSTERECTOMY     BACTERIAL OVERGROWTH TEST N/A 09/06/2015   Procedure: BACTERIAL OVERGROWTH TEST;  Surgeon: Daneil Dolin, MD;  Location: [facility];  Service: Endoscopy;  Laterality: N/A;  800   BREAST LUMPECTOMY Right    benign   CATARACT EXTRACTION Bilateral 2006   Implants in both, surgeries done 6 weeks apart   CHOLECYSTECTOMY     COLONOSCOPY  03/08/02   [name]: diverticulosis, internal hemorrhoids, adenomatous colon polyp   COLONOSCOPY  01/23/04   [name]: diverticulosis, internal hemorrhoids   COLONOSCOPY  09/25/05   [name]: diverticulosis   COLONOSCOPY  07/05/09   [name]: severe diverticulosis   COLONOSCOPY  01/21/11   [name]: severe  diverticulosis in sigmoid to desc colon, int hemorrhoids, follow up TCS in 5 years   COLONOSCOPY N/A 04/10/2016   Arlicia Paquette: diverticulosis   CYSTOSCOPY W/ URETERAL STENT PLACEMENT Right 03/15/2021   Procedure: CYSTOSCOPY WITH RETROGRADE PYELOGRAM/URETERAL STENT PLACEMENT;  Surgeon: Janith Lima, MD;  Location: [facility];  Service: Urology;  Laterality: Right;   CYSTOSCOPY WITH RETROGRADE PYELOGRAM, URETEROSCOPY AND STENT PLACEMENT Right 03/29/2021   Procedure: CYSTOSCOPY WITH RETROGRADE PYELOGRAM, URETEROSCOPY AND STENT EXCHANGE;  Surgeon: Cleon Gustin, MD;  Location: [facility];  Service: Urology;  Laterality: Right;   DILATION AND CURETTAGE OF UTERUS     ESOPHAGEAL MANOMETRY  03/21/08   [place]: findings c/w [place] Esophagus   ESOPHAGOGASTRODUODENOSCOPY  03/08/02   [name]: esophageal stricture, chronic gerd, s/p dilation.    ESOPHAGOGASTRODUODENOSCOPY  01/23/04   [name]: esophageal stricture, gastritis, hiatal hernia   ESOPHAGOGASTRODUODENOSCOPY  09/25/05   [name]: gastritis, benign bx, no H.pylori   ESOPHAGOGASTRODUODENOSCOPY  07/05/09   [name]: gastropathy, benign small bowel bx and gastric bx   ESOPHAGOGASTRODUODENOSCOPY N/A 05/15/2015   Dr. Gala Romney- diffuse moderate inflammation characterized by congestion (edema), erythema, and linear erosions was found in the entire examined stomach. bx= reactive gastropathy   FOOT SURGERY Left    HEMORRHOID BANDING  2017   Dr.Riyanshi Wahab   HOLMIUM LASER APPLICATION Right 05/27/6757   Procedure: HOLMIUM LASER APPLICATION;  Surgeon: Cleon Gustin, MD;  Location: [facility];  Service: Urology;  Laterality: Right;  LAPAROSCOPIC VAGINAL HYSTERECTOMY     ORIF WRIST FRACTURE Left 06/23/2018   Procedure: OPEN REDUCTION INTERNAL FIXATION (ORIF) LEFT WRIST FRACTURE;  Surgeon: Renette Butters, MD;  Location: [facility];  Service: Orthopedics;  Laterality: Left;  regional arm block   RECTOCELE REPAIR     TOTAL KNEE ARTHROPLASTY Right      Prior to Admission medications   Medication Sig Start Date End Date Taking? Authorizing Provider  acetaminophen (TYLENOL 8 HOUR) 650 MG CR tablet Take 1 tablet (650 mg total) by mouth every 8 (eight) hours as needed for pain. 09/09/21  Yes Leath-Warren, Alda Lea, NP  acetaminophen (TYLENOL) 500 MG tablet Take 500 mg by mouth at bedtime.   Yes [provider]  ascorbic acid (VITAMIN C) 500 MG tablet Take 500 mg by mouth daily.   Yes [provider]  aspirin EC 81 MG tablet Take 81 mg by mouth at bedtime.   Yes [provider]  Azelastine-Fluticasone 137-50 MCG/ACT SUSP Place 1 spray into the nose every 12 (twelve) hours. 04/10/21  Yes Cook, Jayce G, DO  Carboxymethylcellulose Sodium (THERATEARS) 0.25 % SOLN Place 1 drop into both eyes 2 (two) times daily.   Yes [provider]  cefUROXime (CEFTIN) 250 MG tablet Take 1 tablet (250 mg total) by mouth 2 (two) times daily with a meal. 12/19/21  Yes McKenzie, Candee Furbish, MD  cholecalciferol (VITAMIN D3) 25 MCG (1000 UNIT) tablet Take 1,000 Units by mouth at bedtime.   Yes [provider]  clidinium-chlordiazePOXIDE (LIBRAX) 5-2.5 MG capsule TAKE 1 CAPSULE BY MOUTH 3 (THREE) TIMES DAILY AS NEEDED (FOR ESOPHAGEAL SYMPTOMS). 12/03/21  Yes Erenest Rasher, PA-C  ezetimibe (ZETIA) 10 MG tablet TAKE 1 TABLET BY MOUTH EVERY DAY Patient taking differently: Take 10 mg by mouth at bedtime. 07/17/21  Yes Strader, Fransisco Hertz, PA-C  Guaifenesin 1200 MG TB12 Take 1,200 mg by mouth daily with supper.   Yes [provider]  HYDROcodone-acetaminophen (NORCO/VICODIN) 5-325 MG tablet Take 1 tablet by mouth every 8 (eight) hours as needed for moderate pain or severe pain. 09/13/21  Yes Cook, Jayce G, DO  hyoscyamine (LEVSIN SL) 0.125 MG SL tablet USE 1 TABLET UNDER THE TONGUE BEFORE MEALS AND AT BEDTIME AS NEEDED FOR FLARES IN SYMPTOMS, DIARRHEA/ABDOMINAL CRAMPING 11/07/21  Yes Mahala Menghini, PA-C  ketoconazole  (NIZORAL) 2 % cream Apply 1 application topically daily as needed for irritation (to affected area. external use only). Patient taking differently: Apply 1 application  topically daily. 10/27/20  Yes Mahala Menghini, PA-C  losartan (COZAAR) 50 MG tablet Take 1 tablet (50 mg total) by mouth daily. Patient taking differently: Take 50 mg by mouth at bedtime. 09/28/21  Yes Strader, [place] M, PA-C  meclizine (ANTIVERT) 25 MG tablet Take 1 tablet (25 mg total) by mouth 3 (three) times daily as needed. Patient taking differently: Take 25 mg by mouth 3 (three) times daily as needed for dizziness. 09/28/21  Yes Cook, Jayce G, DO  metFORMIN (GLUCOPHAGE) 500 MG tablet TAKE ONE TABLET BY MOUTH EVERY MORNING ONE AT LUNCH AND 2 AT BEDTIME 10/01/21  Yes Cook, Jayce G, DO  methenamine (HIPREX) 1 g tablet TAKE 1 TABLET BY MOUTH 2 TIMES DAILY. 07/25/21  Yes McKenzie, Candee Furbish, MD  mirabegron ER (MYRBETRIQ) 50 MG TB24 tablet Take 1 tablet (50 mg total) by mouth daily. 10/25/20  Yes McKenzie, Candee Furbish, MD  Multiple Vitamins-Minerals (PRESERVISION AREDS 2) CAPS Take 1 capsule by mouth 2 (  two) times daily.    Yes [provider]  nateglinide (STARLIX) 120 MG tablet TAKE 1 TABLET (120 MG TOTAL) BY MOUTH 3 (THREE) TIMES DAILY WITH MEALS 09/07/21  Yes Cook, Jayce G, DO  omega-3 acid ethyl esters (LOVAZA) 1 g capsule Take 2 g by mouth every morning.   Yes [provider]  Pancrelipase, Lip-Prot-Amyl, (CREON) 24000-76000 units CPEP TAKE 3 CAPSULES THREE TIMES DAILY WITH MEALS AND ONE CAPSULE WITH SNACK TWICE DAILY 04/05/21  Yes Mahala Menghini, PA-C  sodium chloride (OCEAN) 0.65 % SOLN nasal spray Place 1 spray into both nostrils every morning.   Yes [provider]  blood glucose meter kit and supplies KIT Dispense based on  insurance preference. Tests once a day E11.9 09/25/18   [name], MD    Allergies as of 12/25/2021 - Review Complete 12/06/2021  Allergen Reaction Noted   Adhesive [tape]  Other (See Comments) 07/21/2016   Estrogens Other (See Comments) 10/15/2012   Sulfonamide derivatives Rash 03/01/2008    Family History  Problem Relation Age of Onset   Ovarian cancer Mother    Diabetes Father    Heart disease Father    Breast cancer Sister    Liver cancer Sister    Colon cancer Cousin        Paternal side   Diabetes Maternal Aunt    Liver disease Cousin        Maternal side, never drank    Social History   Socioeconomic History   Marital status: Widowed    Spouse name: Not on file   Number of children: 3   Years of education: Not on file   Highest education level: Not on file  Occupational History   Occupation: Retired  Tobacco Use   Smoking status: Never   Smokeless tobacco: Never   Tobacco comments:    Never smoked  Vaping Use   Vaping Use: Never used  Substance and Sexual Activity   Alcohol use: No    Alcohol/week: 0.0 standard drinks of alcohol   Drug use: No   Sexual activity: Not Currently  Other Topics Concern   Not on file  Social History Narrative   Widowed since 07/2018. Married x 61 years.   3 sons. [place] and [place].   Social Determinants of Health   Financial Resource Strain: Low Risk  (05/15/2021)   Overall Financial Resource Strain (CARDIA)    Difficulty of Paying Living Expenses: Not hard at all  Food Insecurity: No Food Insecurity (05/15/2021)   Hunger Vital Sign    Worried About Running Out of Food in the Last Year: Never true    Ran Out of Food in the Last Year: Never true  Transportation Needs: No Transportation Needs (05/15/2021)   PRAPARE - Hydrologist (Medical): No    Lack of Transportation (Non-Medical): No  Physical Activity: Insufficiently Active (05/15/2021)   Exercise Vital Sign    Days of Exercise per Week: 5 days    Minutes of Exercise per Session: 20 min  Stress: No Stress Concern Present (05/15/2021)   [place]     Feeling of Stress : Not at all  Social Connections: Moderately Integrated (05/15/2021)   Social Connection and Isolation Panel [NHANES]    Frequency of Communication with Friends and Family: More than three times a week    Frequency of Social Gatherings with Friends and Family: More than three times a  week    Attends Religious Services: More than 4 times per year    Active Member of Clubs or Organizations: Yes    Attends Archivist Meetings: More than 4 times per year    Marital Status: Widowed  Intimate Partner Violence: Not At Risk (05/15/2021)   Humiliation, Afraid, Rape, and Kick questionnaire    Fear of Current or Ex-Partner: No    Emotionally Abused: No    Physically Abused: No    Sexually Abused: No    Review of Systems: See HPI, otherwise negative ROS  Physical Exam: BP 138/89   Pulse 82   Temp 98.7 F (37.1 C) (Oral)   Resp 15   SpO2 (!) 16%  General:     Frail elderly ladyNeck:  Supple; no masses or thyromegaly. No significant cervical adenopathy. Lungs:  Clear throughout to auscultation.   No wheezes, crackles, or rhonchi. No acute distress. Heart:  Regular rate and rhythm; no murmurs, clicks, rubs,  or gallops. Abdomen: Non-distended, normal bowel sounds.  Soft and nontender without appreciable mass or hepatosplenomegaly.  Pulses:  Normal pulses noted. Extremities:  Without clubbing or edema.  Impression/Plan:   88 year old lady presents with poorly controlled GERD and dysphagia, abnormal BPE.  I will offer the patient a EGD with esophageal dilation as feasible/appropriate per plan. The risks, benefits, limitations, alternatives and imponderables have been reviewed with the patient. Potential for esophageal dilation, biopsy, etc. have also been reviewed.  Questions have been answered. All parties agreeable.      Notice: This dictation was prepared with Dragon dictation along with smaller phrase technology. Any transcriptional errors that result from  this process are unintentional and may not be corrected upon review.

## 2022-01-02 NOTE — Telephone Encounter (Signed)
RX was sent to pharmacy on file.

## 2022-01-02 NOTE — Anesthesia Preprocedure Evaluation (Signed)
Anesthesia Evaluation  Patient identified by MRN, date of birth, ID band Patient awake    Reviewed: Allergy & Precautions, H&P , NPO status , Patient's Chart, lab work & pertinent test results, reviewed documented beta blocker date and time   History of Anesthesia Complications (+) PONV and history of anesthetic complications  Airway Mallampati: II  TM Distance: >3 FB Neck ROM: full    Dental no notable dental hx.    Pulmonary neg pulmonary ROS   Pulmonary exam normal breath sounds clear to auscultation       Cardiovascular Exercise Tolerance: Good hypertension, negative cardio ROS  Rhythm:regular Rate:Normal     Neuro/Psych negative neurological ROS  negative psych ROS   GI/Hepatic Neg liver ROS,GERD  ,,  Endo/Other  negative endocrine ROSdiabetes    Renal/GU negative Renal ROS  negative genitourinary   Musculoskeletal   Abdominal   Peds  Hematology negative hematology ROS (+)   Anesthesia Other Findings ECHO 03/08/20: EF 60-65%  Reproductive/Obstetrics negative OB ROS                              Anesthesia Physical Anesthesia Plan  ASA: 2  Anesthesia Plan: General   Post-op Pain Management:    Induction:   PONV Risk Score and Plan:   Airway Management Planned:   Additional Equipment:   Intra-op Plan:   Post-operative Plan:   Informed Consent: I have reviewed the patients History and Physical, chart, labs and discussed the procedure including the risks, benefits and alternatives for the proposed anesthesia with the patient or authorized representative who has indicated his/her understanding and acceptance.     Dental Advisory Given  Plan Discussed with: CRNA  Anesthesia Plan Comments:          Anesthesia Quick Evaluation

## 2022-01-02 NOTE — Transfer of Care (Signed)
Immediate Anesthesia Transfer of Care Note  Patient: [place]  Procedure(s) Performed: ESOPHAGOGASTRODUODENOSCOPY (EGD) WITH PROPOFOL [place]  Patient Location: PACU  Anesthesia Type:General  Level of Consciousness: awake  Airway & Oxygen Therapy: Patient Spontanous Breathing  Post-op Assessment: Report given to RN and Post -op Vital signs reviewed and stable  Post vital signs: Reviewed and stable  Last Vitals:  Vitals Value Taken Time  BP    Temp    Pulse    Resp    SpO2      Last Pain:  Vitals:   01/02/22 1110  TempSrc:   PainSc: 0-No pain         Complications: No notable events documented.

## 2022-01-03 LAB — SURGICAL PATHOLOGY

## 2022-01-07 ENCOUNTER — Encounter: Payer: Self-pay | Admitting: Internal Medicine

## 2022-01-09 ENCOUNTER — Encounter (HOSPITAL_COMMUNITY): Payer: Self-pay | Admitting: Internal Medicine

## 2022-01-15 DIAGNOSIS — H34832 Tributary (branch) retinal vein occlusion, left eye, with macular edema: Secondary | ICD-10-CM | POA: Diagnosis not present

## 2022-01-21 ENCOUNTER — Other Ambulatory Visit: Payer: Self-pay | Admitting: Urology

## 2022-01-21 ENCOUNTER — Other Ambulatory Visit: Payer: Medicare Other

## 2022-01-21 ENCOUNTER — Other Ambulatory Visit: Payer: Self-pay | Admitting: Student

## 2022-01-21 DIAGNOSIS — N3010 Interstitial cystitis (chronic) without hematuria: Secondary | ICD-10-CM | POA: Diagnosis not present

## 2022-01-21 DIAGNOSIS — Z87440 Personal history of urinary (tract) infections: Secondary | ICD-10-CM

## 2022-01-21 LAB — MICROSCOPIC EXAMINATION: WBC, UA: >30 /hpf — AB (ref 0–5)

## 2022-01-21 LAB — URINALYSIS, ROUTINE W REFLEX MICROSCOPIC
Bilirubin, UA: NEGATIVE
Glucose, UA: NEGATIVE
Ketones, UA: NEGATIVE
Nitrite, UA: NEGATIVE
Protein,UA: NEGATIVE
Specific Gravity, UA: 1.01 (ref 1.005–1.030)
Urobilinogen, Ur: 0.2 mg/dL (ref 0.2–1.0)
pH, UA: 6.5 (ref 5.0–7.5)

## 2022-01-21 MED ORDER — CEFUROXIME AXETIL 500 MG PO TABS: 500.0000 mg | ORAL_TABLET | Freq: Two times a day (BID) | ORAL | 0 refills | Status: AC

## 2022-01-21 NOTE — Progress Notes (Unsigned)
Patient in office complaining of uti symptoms.  Urine sent for culture. Ceftin '500mg'$  po bid x 7 days sent to pharmacy per Dr. Alyson Ingles.  Patient called and made aware.

## 2022-01-23 ENCOUNTER — Telehealth: Payer: Self-pay | Admitting: Internal Medicine

## 2022-01-23 LAB — URINE CULTURE

## 2022-01-23 NOTE — Telephone Encounter (Signed)
Patient called at 4:00 and said she had left a message but hasn't heard back from anyone.  She takes an acid blocker and recently had a bad UTI and was given an antibiotic and told to wait a few hours after taking the antibiotic before taking the acid blocker because it could affect the potency of the antibiotic.  Today she did that and has had terrible pain, she is bloated, esophagus is burning and wants to know can she take 2 of the acid blockers.  Please advise.

## 2022-01-24 NOTE — Telephone Encounter (Signed)
Pt has already been helped with this situation and it is resolved

## 2022-01-30 ENCOUNTER — Ambulatory Visit (INDEPENDENT_AMBULATORY_CARE_PROVIDER_SITE_OTHER): Payer: Medicare Other | Admitting: Urology

## 2022-01-30 DIAGNOSIS — N3010 Interstitial cystitis (chronic) without hematuria: Secondary | ICD-10-CM | POA: Diagnosis not present

## 2022-01-30 MED ORDER — DIMETHYL SULFOXIDE 50 % IS SOLN
50.0000 mL | Freq: Once | INTRAVESICAL | Status: AC
  Administered 2022-01-30: 50 mL via URETHRAL

## 2022-01-30 MED ORDER — LIDOCAINE HCL 2 % IJ SOLN
10.0000 mL | Freq: Once | INTRAMUSCULAR | Status: AC
  Administered 2022-01-30: 200 mg

## 2022-01-30 MED ORDER — HYDROCORTISONE SOD SUC (PF) 100 MG IJ SOLR
100.0000 mg | Freq: Once | INTRAMUSCULAR | Status: AC
  Administered 2022-01-30: 100 mg

## 2022-01-30 MED ORDER — SODIUM BICARBONATE 8.4 % IV SOLN
50.0000 meq | Freq: Once | INTRAVENOUS | Status: AC
  Administered 2022-01-30: 50 meq

## 2022-01-30 NOTE — Progress Notes (Signed)
Bladder Instillation DMSO  Due to Interstitial cystitis patient is present today for a Bladder Instillation of DMSO treatment. Patient was cleaned and prepped in a sterile fashion with betadine.  A 20FR catheter was inserted, urine return was noted 110m, urine was yellow in color.  DMSO treatment was instilled into the bladder. The catheter was then removed. Patient tolerated well, no complications were noted pt was instructed to hold the instillation for 20 minutes and then will void it out. Pt voiced understanding.  Gave pt samples of mybertiq '50mg'$   Preformed by: SMarisue Brooklyn CMA  Follow up/ Additional notes: Follow up as scheduled

## 2022-01-31 LAB — URINALYSIS, ROUTINE W REFLEX MICROSCOPIC
Bilirubin, UA: NEGATIVE
Glucose, UA: NEGATIVE
Ketones, UA: NEGATIVE
Nitrite, UA: NEGATIVE
Protein,UA: NEGATIVE
RBC, UA: NEGATIVE
Specific Gravity, UA: 1.02 (ref 1.005–1.030)
Urobilinogen, Ur: 0.2 mg/dL (ref 0.2–1.0)
pH, UA: 5.5 (ref 5.0–7.5)

## 2022-01-31 LAB — MICROSCOPIC EXAMINATION
Bacteria, UA: NONE SEEN
RBC, Urine: NONE SEEN /hpf (ref 0–2)

## 2022-02-15 ENCOUNTER — Ambulatory Visit
Admission: EM | Admit: 2022-02-15 | Discharge: 2022-02-15 | Disposition: A | Discharge status: Home / Self Care | Payer: Medicare Other | Attending: Family Medicine | Admitting: Family Medicine

## 2022-02-15 DIAGNOSIS — R0982 Postnasal drip: Secondary | ICD-10-CM | POA: Diagnosis not present

## 2022-02-15 DIAGNOSIS — R350 Frequency of micturition: Secondary | ICD-10-CM | POA: Diagnosis present

## 2022-02-15 DIAGNOSIS — N390 Urinary tract infection, site not specified: Secondary | ICD-10-CM | POA: Diagnosis not present

## 2022-02-15 DIAGNOSIS — I10 Essential (primary) hypertension: Secondary | ICD-10-CM | POA: Diagnosis not present

## 2022-02-15 DIAGNOSIS — J029 Acute pharyngitis, unspecified: Secondary | ICD-10-CM | POA: Diagnosis not present

## 2022-02-15 DIAGNOSIS — Z20822 Contact with and (suspected) exposure to covid-19: Secondary | ICD-10-CM | POA: Diagnosis not present

## 2022-02-15 LAB — POCT URINALYSIS DIP (MANUAL ENTRY)
Bilirubin, UA: NEGATIVE
Glucose, UA: NEGATIVE mg/dL
Ketones, POC UA: NEGATIVE mg/dL
Nitrite, UA: NEGATIVE
Protein Ur, POC: NEGATIVE mg/dL
Spec Grav, UA: 1.015 (ref 1.010–1.025)
Urobilinogen, UA: 0.2 E.U./dL
pH, UA: 6 (ref 5.0–8.0)

## 2022-02-15 MED ORDER — CEPHALEXIN 500 MG PO CAPS: 500.0000 mg | ORAL_CAPSULE | Freq: Two times a day (BID) | ORAL | 0 refills | Status: DC

## 2022-02-15 NOTE — ED Triage Notes (Signed)
Pt reports she has some frequent urination, urgency, pressure when urinating and feet swelling x 1 day. Pt takes mybetriq but it is not helping

## 2022-02-15 NOTE — Discharge Instructions (Signed)
I have sent over an antibiotic to treat for a urinary tract infection while we wait for your urine culture to confirm this and make sure were on the right medicine.  It looks like your nasal spray already has both a drying agent and the steroid component to so use this twice daily and use a decongestant such as Coricidin HBP to help further with your postnasal drainage and congestion.  Your COVID test should be available as soon as tomorrow.

## 2022-02-15 NOTE — ED Provider Notes (Signed)
[facility]    CSN: [id number] Arrival date & time: 02/15/22  1327      History   Chief Complaint Chief Complaint  Patient presents with   Urinary Tract Infection    HPI Deborah Gray is a 88 y.o. female.   Patient presenting today with 1 day history of urinary frequency, urgency, suprapubic pressure.  States she has a history of interstitial cystitis but does tend to get urinary tract infections additionally so always has to get her urine checked when she has a flareup to make sure.  Denies fever, chills, hematuria, abdominal pain, nausea vomiting or diarrhea.  Not taking anything over-the-counter for symptoms.  Does take Myrbetriq daily for urinary issues overall.  Also notes since arriving today in the clinic has had a scratchy throat and postnasal drainage.  Multiple exposures to COVID recently and wanting to be checked.  Takes Mucinex and nasal sprays twice daily and rinses her sinuses twice daily.    Past Medical History:  Diagnosis Date   Arthritis    Benign neoplasm of colon    Constipation    Diaphragmatic hernia without mention of obstruction or gangrene    Diverticulosis of colon (without mention of hemorrhage)    Dysphagia, pharyngoesophageal phase    Esophageal reflux    Essential hypertension    Heart murmur    Hemorrhoids    Hyperlipidemia    Internal hemorrhoids without mention of complication    Left wrist fracture    PONV (postoperative nausea and vomiting)    Stricture and stenosis of esophagus    Type 2 diabetes mellitus ([place])    Unspecified gastritis and gastroduodenitis without mention of hemorrhage     Patient Active Problem List   Diagnosis Date Noted   Acute bilateral low back pain with right-sided sciatica 09/10/2021   Early satiety 07/11/2021   Nephrolithiasis 03/15/2021   IBS (irritable bowel syndrome) 01/30/2015   Type 2 diabetes mellitus ([place]) 12/22/2014   Hyperlipidemia, unspecified 12/22/2014   Essential  hypertension 11/25/2013   Osteopenia 10/23/2012   Chronic pancreatitis ([place]) 02/26/2011   Gastroesophageal reflux disease 01/18/2011   Esophageal spasm 11/29/2010   DJD (degenerative joint disease) 11/29/2010   Diarrhea 06/29/2009   Hx of adenomatous colonic polyps 06/29/2009    Past Surgical History:  Procedure Laterality Date   ABDOMINAL HYSTERECTOMY     BACTERIAL OVERGROWTH TEST N/A 09/06/2015   Procedure: BACTERIAL OVERGROWTH TEST;  Surgeon: Daneil Dolin, MD;  [facility];  Service: Endoscopy;  Laterality: N/A;  800   BIOPSY  01/02/2022   Procedure: BIOPSY;  Surgeon: Daneil Dolin, MD;  [facility];  Service: Endoscopy;;   BREAST LUMPECTOMY Right    benign   CATARACT EXTRACTION Bilateral 2006   Implants in both, surgeries done 6 weeks apart   CHOLECYSTECTOMY     COLONOSCOPY  03/08/02   [name]: diverticulosis, internal hemorrhoids, adenomatous colon polyp   COLONOSCOPY  01/23/04   [name]: diverticulosis, internal hemorrhoids   COLONOSCOPY  09/25/05   [name]: diverticulosis   COLONOSCOPY  07/05/09   [name]: severe diverticulosis   COLONOSCOPY  01/21/11   [name]: severe diverticulosis in sigmoid to desc colon, int hemorrhoids, follow up TCS in 5 years   COLONOSCOPY N/A 04/10/2016   [name]: diverticulosis   CYSTOSCOPY W/ URETERAL STENT PLACEMENT Right 03/15/2021   Procedure: CYSTOSCOPY WITH RETROGRADE PYELOGRAM/URETERAL STENT PLACEMENT;  Surgeon: Janith Lima, MD;  Location: [facility];  Service: Urology;  Laterality: Right;  CYSTOSCOPY WITH RETROGRADE PYELOGRAM, URETEROSCOPY AND STENT PLACEMENT Right 03/29/2021   Procedure: CYSTOSCOPY WITH RETROGRADE PYELOGRAM, URETEROSCOPY AND STENT EXCHANGE;  Surgeon: Cleon Gustin, MD;  Location: [facility];  Service: Urology;  Laterality: Right;   DILATION AND CURETTAGE OF UTERUS     ESOPHAGEAL MANOMETRY  03/21/08   [place]: findings c/w [place] Esophagus   ESOPHAGOGASTRODUODENOSCOPY  03/08/02    [name]: esophageal stricture, chronic gerd, s/p dilation.    ESOPHAGOGASTRODUODENOSCOPY  01/23/04   [name]: esophageal stricture, gastritis, hiatal hernia   ESOPHAGOGASTRODUODENOSCOPY  09/25/05   [name]: gastritis, benign bx, no H.pylori   ESOPHAGOGASTRODUODENOSCOPY  07/05/09   [name]: gastropathy, benign small bowel bx and gastric bx   ESOPHAGOGASTRODUODENOSCOPY N/A 05/15/2015   Dr. Gala Romney- diffuse moderate inflammation characterized by congestion (edema), erythema, and linear erosions was found in the entire examined stomach. bx= reactive gastropathy   ESOPHAGOGASTRODUODENOSCOPY (EGD) WITH PROPOFOL N/A 01/02/2022   Procedure: ESOPHAGOGASTRODUODENOSCOPY (EGD) WITH PROPOFOL;  Surgeon: Daneil Dolin, MD;  [facility];  Service: Endoscopy;  Laterality: N/A;  10:30am, asa 2   FOOT SURGERY Left    HEMORRHOID BANDING  2017   Dr.[name]   HOLMIUM LASER APPLICATION Right 09/19/599   Procedure: HOLMIUM LASER APPLICATION;  Surgeon: Cleon Gustin, MD;  Location: [facility];  Service: Urology;  Laterality: Right;   LAPAROSCOPIC VAGINAL HYSTERECTOMY     MALONEY DILATION N/A 01/02/2022   Procedure: Venia Minks DILATION;  Surgeon: Daneil Dolin, MD;  [facility];  Service: Endoscopy;  Laterality: N/A;   ORIF WRIST FRACTURE Left 06/23/2018   Procedure: OPEN REDUCTION INTERNAL FIXATION (ORIF) LEFT WRIST FRACTURE;  Surgeon: Renette Butters, MD;  Location: [facility];  Service: Orthopedics;  Laterality: Left;  regional arm block   RECTOCELE REPAIR     TOTAL KNEE ARTHROPLASTY Right     OB History     Gravida  4   Para  3   Term  3   Preterm      AB  1   Living  3      SAB  1   IAB      Ectopic      Multiple      Live Births               Home Medications    Prior to Admission medications   Medication Sig Start Date End Date Taking? Authorizing Provider  cephALEXin (KEFLEX) 500 MG capsule Take 1 capsule (500 mg total) by mouth 2  (two) times daily. 02/15/22  Yes Volney American, PA-C  RABEprazole (ACIPHEX) 20 MG tablet Take 1 tablet (20 mg total) by mouth daily. 01/02/22   [name], Cristopher Estimable, MD  acetaminophen (TYLENOL 8 HOUR) 650 MG CR tablet Take 1 tablet (650 mg total) by mouth every 8 (eight) hours as needed for pain. 09/09/21   Leath-Warren, Alda Lea, NP  acetaminophen (TYLENOL) 500 MG tablet Take 500 mg by mouth at bedtime.    [provider]  ascorbic acid (VITAMIN C) 500 MG tablet Take 500 mg by mouth daily.    [provider]  aspirin EC 81 MG tablet Take 81 mg by mouth at bedtime.    [provider]  Azelastine-Fluticasone 137-50 MCG/ACT SUSP Place 1 spray into the nose every 12 (twelve) hours. 04/10/21   Coral Spikes, DO  blood glucose meter kit and supplies KIT Dispense based on  insurance preference. Tests once a day E11.9 09/25/18   Luking,  Grace Bushy, MD  Carboxymethylcellulose Sodium (THERATEARS) 0.25 % SOLN Place 1 drop into both eyes 2 (two) times daily.    [provider]  cefUROXime (CEFTIN) 250 MG tablet Take 1 tablet (250 mg total) by mouth 2 (two) times daily with a meal. 12/19/21   McKenzie, Candee Furbish, MD  cholecalciferol (VITAMIN D3) 25 MCG (1000 UNIT) tablet Take 1,000 Units by mouth at bedtime.    [provider]  clidinium-chlordiazePOXIDE (LIBRAX) 5-2.5 MG capsule TAKE 1 CAPSULE BY MOUTH 3 (THREE) TIMES DAILY AS NEEDED (FOR ESOPHAGEAL SYMPTOMS). 12/03/21   Erenest Rasher, PA-C  ezetimibe (ZETIA) 10 MG tablet TAKE 1 TABLET BY MOUTH EVERY DAY 01/21/22   Ahmed Prima, Fransisco Hertz, PA-C  Guaifenesin 1200 MG TB12 Take 1,200 mg by mouth daily with supper.    [provider]  HYDROcodone-acetaminophen (NORCO/VICODIN) 5-325 MG tablet Take 1 tablet by mouth every 8 (eight) hours as needed for moderate pain or severe pain. 09/13/21   Coral Spikes, DO  hyoscyamine (LEVSIN SL) 0.125 MG SL tablet USE 1 TABLET UNDER THE TONGUE BEFORE MEALS AND AT BEDTIME AS  NEEDED FOR FLARES IN SYMPTOMS, DIARRHEA/ABDOMINAL CRAMPING 11/07/21   Mahala Menghini, PA-C  ketoconazole (NIZORAL) 2 % cream Apply 1 application topically daily as needed for irritation (to affected area. external use only). Patient taking differently: Apply 1 application  topically daily. 10/27/20   Mahala Menghini, PA-C  losartan (COZAAR) 50 MG tablet Take 1 tablet (50 mg total) by mouth daily. Patient taking differently: Take 50 mg by mouth at bedtime. 09/28/21   Strader, Fransisco Hertz, PA-C  meclizine (ANTIVERT) 25 MG tablet Take 1 tablet (25 mg total) by mouth 3 (three) times daily as needed. Patient taking differently: Take 25 mg by mouth 3 (three) times daily as needed for dizziness. 09/28/21   Coral Spikes, DO  metFORMIN (GLUCOPHAGE) 500 MG tablet TAKE ONE TABLET BY MOUTH EVERY MORNING ONE AT LUNCH AND 2 AT BEDTIME 10/01/21   Cook, [place] G, DO  methenamine (HIPREX) 1 g tablet TAKE 1 TABLET BY MOUTH 2 TIMES DAILY. 07/25/21   McKenzie, Candee Furbish, MD  Multiple Vitamins-Minerals (PRESERVISION AREDS 2) CAPS Take 1 capsule by mouth 2 (two) times daily.     [provider]  MYRBETRIQ 50 MG TB24 tablet TAKE 1 TABLET BY MOUTH EVERY DAY 01/29/22   McKenzie, Candee Furbish, MD  nateglinide (STARLIX) 120 MG tablet TAKE 1 TABLET (120 MG TOTAL) BY MOUTH 3 (THREE) TIMES DAILY WITH MEALS 09/07/21   Cook, [place] G, DO  omega-3 acid ethyl esters (LOVAZA) 1 g capsule Take 2 g by mouth every morning.    [provider]  Pancrelipase, Lip-Prot-Amyl, (CREON) 24000-76000 units CPEP TAKE 3 CAPSULES THREE TIMES DAILY WITH MEALS AND ONE CAPSULE WITH SNACK TWICE DAILY 04/05/21   Mahala Menghini, PA-C  sodium chloride (OCEAN) 0.65 % SOLN nasal spray Place 1 spray into both nostrils every morning.    [provider]    Family History Family History  Problem Relation Age of Onset   Ovarian cancer Mother    Diabetes Father    Heart disease Father    Breast cancer Sister    Liver cancer Sister    Colon  cancer Cousin        Paternal side   Diabetes Maternal Aunt    Liver disease Cousin        Maternal side, never drank    Social History Social History   Tobacco  Use   Smoking status: Never   Smokeless tobacco: Never   Tobacco comments:    Never smoked  Vaping Use   Vaping Use: Never used  Substance Use Topics   Alcohol use: No    Alcohol/week: 0.0 standard drinks of alcohol   Drug use: No     Allergies   Adhesive [tape], Estrogens, and Sulfonamide derivatives   Review of Systems Review of Systems PER HPI  Physical Exam Triage Vital Signs ED Triage Vitals  Enc Vitals Group     BP 02/15/22 1615 (!) 185/83     Pulse Rate 02/15/22 1615 100     Resp 02/15/22 1615 18     Temp 02/15/22 1615 97.7 F (36.5 C)     Temp Source 02/15/22 1615 Oral     SpO2 02/15/22 1615 96 %     Weight --      Height --      Head Circumference --      Peak Flow --      Pain Score 02/15/22 1618 0     Pain Loc --      Pain Edu? --      Excl. in [place]? --    No data found.  Updated Vital Signs BP (!) 185/83 (BP Location: Right Arm)   Pulse 100   Temp 97.7 F (36.5 C) (Oral)   Resp 18   SpO2 96%   Visual Acuity Right Eye Distance:   Left Eye Distance:   Bilateral Distance:    Right Eye Near:   Left Eye Near:    Bilateral Near:     Physical Exam Vitals and nursing note reviewed.  Constitutional:      Appearance: Normal appearance. She is not ill-appearing.  HENT:     Head: Atraumatic.     Right Ear: Tympanic membrane normal.     Left Ear: Tympanic membrane normal.     Nose: Rhinorrhea present.     Mouth/Throat:     Mouth: Mucous membranes are moist.     Pharynx: Posterior oropharyngeal erythema present.  Eyes:     Extraocular Movements: Extraocular movements intact.     Conjunctiva/sclera: Conjunctivae normal.  Cardiovascular:     Rate and Rhythm: Normal rate and regular rhythm.     Heart sounds: Normal heart sounds.  Pulmonary:     Effort: Pulmonary effort is  normal.     Breath sounds: Normal breath sounds.  Abdominal:     General: Bowel sounds are normal. There is no distension.     Palpations: Abdomen is soft.     Tenderness: There is no abdominal tenderness. There is no guarding.  Musculoskeletal:        General: Normal range of motion.     Cervical back: Normal range of motion and neck supple.  Skin:    General: Skin is warm and dry.  Neurological:     Mental Status: She is alert and oriented to person, place, and time.  Psychiatric:        Mood and Affect: Mood normal.        Thought Content: Thought content normal.        Judgment: Judgment normal.      UC Treatments / Results  Labs (all labs ordered are listed, but only abnormal results are displayed) Labs Reviewed  POCT URINALYSIS DIP (MANUAL ENTRY) - Abnormal; Notable for the following components:      Result Value   Clarity, UA cloudy (*)  Blood, UA trace-intact (*)    Leukocytes, UA Trace (*)    All other components within normal limits  URINE CULTURE  SARS CORONAVIRUS 2 (TAT 6-24 HRS)    EKG   Radiology No results found.  Procedures Procedures (including critical care time)  Medications Ordered in UC Medications - No data to display  Initial Impression / Assessment and Plan / UC Course  I have reviewed the triage vital signs and the nursing notes.  Pertinent labs & imaging results that were available during my care of the patient were reviewed by me and considered in my medical decision making (see chart for details).     Hypertensive in triage, otherwise vital signs reassuring.  Urinalysis today with trace leuks, will cover for a urinary tract infection with Keflex while awaiting urine culture results.  Regarding her new upper respiratory symptoms, COVID testing pending, continue Astelin and Flonase nasal sprays, Coricidin HBP recommended and discussed other supportive home care.  Return for worsening symptoms.  Final Clinical Impressions(s) / UC  Diagnoses   Final diagnoses:  Acute lower UTI  Sore throat  Post-nasal drainage     Discharge Instructions      I have sent over an antibiotic to treat for a urinary tract infection while we wait for your urine culture to confirm this and make sure were on the right medicine.  It looks like your nasal spray already has both a drying agent and the steroid component to so use this twice daily and use a decongestant such as Coricidin HBP to help further with your postnasal drainage and congestion.  Your COVID test should be available as soon as tomorrow.    ED Prescriptions     Medication Sig Dispense Auth. Provider   cephALEXin (KEFLEX) 500 MG capsule Take 1 capsule (500 mg total) by mouth 2 (two) times daily. 10 capsule Volney American, [place]      PDMP not reviewed this encounter.   Volney American, [place] 02/15/22 1752

## 2022-02-16 LAB — SARS CORONAVIRUS 2 (TAT 6-24 HRS): SARS Coronavirus 2: NEGATIVE

## 2022-02-16 LAB — URINE CULTURE: Culture: <10000 — AB

## 2022-02-19 ENCOUNTER — Telehealth: Payer: Self-pay

## 2022-02-19 DIAGNOSIS — R109 Unspecified abdominal pain: Secondary | ICD-10-CM

## 2022-02-19 NOTE — Telephone Encounter (Signed)
Patient called with complaints of hematuria, flank pain. Recently seen at urgent care- negative culture. Urgency present as well. Reviewed with Dr. Alyson Ingles, CT stone ordered.  Patient called and voiced understanding.

## 2022-02-21 ENCOUNTER — Ambulatory Visit (INDEPENDENT_AMBULATORY_CARE_PROVIDER_SITE_OTHER): Payer: Medicare Other | Admitting: Family Medicine

## 2022-02-21 ENCOUNTER — Ambulatory Visit (HOSPITAL_COMMUNITY)
Admission: RE | Admit: 2022-02-21 | Discharge: 2022-02-21 | Disposition: A | Discharge status: Home / Self Care | Payer: Medicare Other | Source: Ambulatory Visit | Attending: Urology | Admitting: Urology

## 2022-02-21 ENCOUNTER — Encounter: Payer: Self-pay | Admitting: Family Medicine

## 2022-02-21 VITALS — BP 141/82 | HR 91 | Temp 98.1°F | Wt 140.4 lb

## 2022-02-21 DIAGNOSIS — K3189 Other diseases of stomach and duodenum: Secondary | ICD-10-CM | POA: Diagnosis not present

## 2022-02-21 DIAGNOSIS — R109 Unspecified abdominal pain: Secondary | ICD-10-CM | POA: Diagnosis not present

## 2022-02-21 DIAGNOSIS — Z87440 Personal history of urinary (tract) infections: Secondary | ICD-10-CM | POA: Insufficient documentation

## 2022-02-21 DIAGNOSIS — N3289 Other specified disorders of bladder: Secondary | ICD-10-CM | POA: Diagnosis not present

## 2022-02-21 DIAGNOSIS — N281 Cyst of kidney, acquired: Secondary | ICD-10-CM | POA: Diagnosis not present

## 2022-02-21 DIAGNOSIS — K5730 Diverticulosis of large intestine without perforation or abscess without bleeding: Secondary | ICD-10-CM | POA: Diagnosis not present

## 2022-02-21 DIAGNOSIS — R3129 Other microscopic hematuria: Secondary | ICD-10-CM | POA: Diagnosis not present

## 2022-02-21 LAB — POCT URINALYSIS DIP (CLINITEK)
Spec Grav, UA: 1.02 (ref 1.010–1.025)
pH, UA: 6 (ref 5.0–8.0)

## 2022-02-21 NOTE — Assessment & Plan Note (Signed)
Culture returned negative.  Urinalysis is clear here today.  Supportive care.  Follow-up with urology as needed.

## 2022-02-21 NOTE — Progress Notes (Signed)
Subjective:  Patient ID: Deborah Gray, female    DOB: 1933-11-27  Age: 89 y.o. MRN: [id number]  CC: Chief Complaint  Patient presents with   Follow-up    Pt arrives for follow up from Urgent Care visit on 02/15/22. Pt had hematuria. Pt does have CT renal study scheduled for today at 4:30 (ordered by urology). Pt reports having symptoms of UTI still. Pt unable to give urine sample but is having urgency/frequency     HPI:  89 year old female presents for follow-up from recent urgent care visit.  Patient recently seen at urgent care for UTI.  She has completed the antibiotics.  Culture was negative.  It revealed no significant growth.  He still having some lower urinary symptoms.  She states that she is having some bladder spasms and frequency.  Follows with urology.  Patient Active Problem List   Diagnosis Date Noted   History of UTI 02/21/2022   Nephrolithiasis 03/15/2021   IBS (irritable bowel syndrome) 01/30/2015   Type 2 diabetes mellitus ([place]) 12/22/2014   Hyperlipidemia, unspecified 12/22/2014   Essential hypertension 11/25/2013   Osteopenia 10/23/2012   Chronic pancreatitis ([place]) 02/26/2011   Gastroesophageal reflux disease 01/18/2011   Esophageal spasm 11/29/2010   DJD (degenerative joint disease) 11/29/2010   Hx of adenomatous colonic polyps 06/29/2009    Social Hx   Social History   Socioeconomic History   Marital status: Widowed    Spouse name: Not on file   Number of children: 3   Years of education: Not on file   Highest education level: Not on file  Occupational History   Occupation: Retired  Tobacco Use   Smoking status: Never   Smokeless tobacco: Never   Tobacco comments:    Never smoked  Vaping Use   Vaping Use: Never used  Substance and Sexual Activity   Alcohol use: No    Alcohol/week: 0.0 standard drinks of alcohol   Drug use: No   Sexual activity: Not Currently  Other Topics Concern   Not on file  Social History Narrative   Widowed  since 07/2018. Married x 61 years.   3 sons. [place] and 1966.   Social Determinants of Health   Financial Resource Strain: Low Risk  (05/15/2021)   Overall Financial Resource Strain (CARDIA)    Difficulty of Paying Living Expenses: Not hard at all  Food Insecurity: No Food Insecurity (05/15/2021)   Hunger Vital Sign    Worried About Running Out of Food in the Last Year: Never true    Ran Out of Food in the Last Year: Never true  Transportation Needs: No Transportation Needs (05/15/2021)   PRAPARE - Hydrologist (Medical): No    Lack of Transportation (Non-Medical): No  Physical Activity: Insufficiently Active (05/15/2021)   Exercise Vital Sign    Days of Exercise per Week: 5 days    Minutes of Exercise per Session: 20 min  Stress: No Stress Concern Present (05/15/2021)   [place]    Feeling of Stress : Not at all  Social Connections: Moderately Integrated (05/15/2021)   Social Connection and Isolation Panel [NHANES]    Frequency of Communication with Friends and Family: More than three times a week    Frequency of Social Gatherings with Friends and Family: More than three times a week    Attends Religious Services: More than 4 times per year    Active Member of Clubs or  Organizations: Yes    Attends Music therapist: More than 4 times per year    Marital Status: Widowed    Review of Systems Per HPI  Objective:  BP (!) 141/82   Pulse 91   Temp 98.1 F (36.7 C)   Wt 140 lb 6.4 oz (63.7 kg)   SpO2 98%   BMI 24.10 kg/m      02/21/2022   11:26 AM 02/15/2022    4:15 PM 01/02/2022   11:32 AM  BP/Weight  Systolic BP 539 767 341  Diastolic BP 82 83 45  Wt. (Lbs) 140.4    BMI 24.1 kg/m2      Physical Exam Vitals and nursing note reviewed.  Constitutional:      General: She is not in acute distress.    Appearance: Normal appearance.  HENT:     Head:  Normocephalic and atraumatic.  Cardiovascular:     Rate and Rhythm: Normal rate and regular rhythm.  Pulmonary:     Effort: Pulmonary effort is normal.     Breath sounds: Normal breath sounds. No wheezing, rhonchi or rales.  Abdominal:     General: There is no distension.     Palpations: Abdomen is soft.     Tenderness: There is no abdominal tenderness.  Neurological:     Mental Status: She is alert.     Lab Results  Component Value Date   WBC 6.7 11/19/2021   HGB 14.0 11/19/2021   HCT 41.9 11/19/2021   PLT 264 11/19/2021   GLUCOSE 142 (H) 11/19/2021   CHOL 167 11/19/2021   TRIG 71 11/19/2021   HDL 70 11/19/2021   LDLCALC 83 11/19/2021   ALT 15 11/19/2021   AST 13 11/19/2021   NA 141 11/19/2021   K 5.1 11/19/2021   CL 103 11/19/2021   CREATININE 0.84 11/19/2021   BUN 27 11/19/2021   CO2 23 11/19/2021   TSH 2.720 11/19/2021   INR 1.7 (H) 04/08/2008   HGBA1C 6.2 (H) 11/19/2021   MICROALBUR 1.0 11/22/2013     Assessment & Plan:   Problem List Items Addressed This Visit       Genitourinary   History of UTI - Primary    Culture returned negative.  Urinalysis is clear here today.  Supportive care.  Follow-up with urology as needed.      Other Visit Diagnoses     Serum calcium elevated       Relevant Orders   PTH, Intact and Calcium   Bladder spasm       Relevant Orders   POCT URINALYSIS DIP (CLINITEK) (Completed)      Teodor Prater Lacinda Axon DO [place]

## 2022-02-21 NOTE — Addendum Note (Signed)
Addended by: Coral Spikes on: 02/21/2022 01:11 PM   Modules accepted: Level of Service

## 2022-02-21 NOTE — Patient Instructions (Signed)
Lab today to recheck calcium  Urine was clear and recent culture was negative.  BP looks fine.  Follow up in 3 months.  Take care  Dr. Lacinda Axon

## 2022-02-22 LAB — PTH, INTACT AND CALCIUM
Calcium: 10.3 mg/dL (ref 8.7–10.3)
PTH: 23 pg/mL (ref 15–65)

## 2022-02-25 ENCOUNTER — Ambulatory Visit (HOSPITAL_COMMUNITY)
Admission: RE | Admit: 2022-02-25 | Discharge: 2022-02-25 | Disposition: A | Discharge status: Home / Self Care | Payer: Medicare Other | Source: Ambulatory Visit | Attending: Family Medicine | Admitting: Family Medicine

## 2022-02-25 ENCOUNTER — Telehealth: Payer: Self-pay

## 2022-02-25 ENCOUNTER — Other Ambulatory Visit: Payer: Self-pay

## 2022-02-25 DIAGNOSIS — J189 Pneumonia, unspecified organism: Secondary | ICD-10-CM | POA: Diagnosis not present

## 2022-02-25 DIAGNOSIS — R942 Abnormal results of pulmonary function studies: Secondary | ICD-10-CM | POA: Insufficient documentation

## 2022-02-25 DIAGNOSIS — R918 Other nonspecific abnormal finding of lung field: Secondary | ICD-10-CM | POA: Diagnosis not present

## 2022-02-25 NOTE — Telephone Encounter (Signed)
Spoke with patient and x ray orders placed per drs orders, patient made aware.

## 2022-02-25 NOTE — Progress Notes (Signed)
Pt. Called and made aware.

## 2022-02-25 NOTE — Telephone Encounter (Signed)
Patient calls and states had a recent CT scan of her kidneys by urologist and was advised to run this by her pcp due to the results that say possible pneumonia and possible bowel obstruction. She is not having any symptoms of either. She sees gastroenterologist and has been taking aciphex daily for prevention of diarrhea and a nasal spray for allergies. Denies any abdominal pain or cough or congestion , please advise.

## 2022-02-25 NOTE — Telephone Encounter (Signed)
Patient call into and voiced she is having frequently, and bladder spam. Had Dr. Alyson Ingles review over CT scan. Made patient aware that she possibly have pneumonia and possible small bowel obstruction. she should contact he PCP or go to the ER. Patient voiced understanding

## 2022-02-26 ENCOUNTER — Other Ambulatory Visit: Payer: Self-pay | Admitting: Family Medicine

## 2022-02-26 ENCOUNTER — Ambulatory Visit: Payer: Medicare Other | Admitting: Gastroenterology

## 2022-02-26 MED ORDER — AZITHROMYCIN 250 MG PO TABS: ORAL_TABLET | ORAL | 0 refills | Status: AC

## 2022-02-26 MED ORDER — AMOXICILLIN-POT CLAVULANATE 875-125 MG PO TABS: 1.0000 | ORAL_TABLET | Freq: Two times a day (BID) | ORAL | 0 refills | Status: DC

## 2022-02-26 NOTE — Progress Notes (Signed)
Results discussed with patient. Patient advised per Dr Lacinda Axon: Odette Horns consistent with Pneumonia. Recommend treatment.  Is patient having regular bowel movements? CT suggested ileus.  Patient verbalized understanding and stated she has no symptoms at oll of cough or pneumonia but ok with treatment at [facility] in [place].  Patient states bowels ok of she gets enough bulk- Patient has office visit scheduled tomorrow am.

## 2022-02-27 ENCOUNTER — Telehealth: Payer: Self-pay | Admitting: *Deleted

## 2022-02-27 ENCOUNTER — Ambulatory Visit (INDEPENDENT_AMBULATORY_CARE_PROVIDER_SITE_OTHER): Payer: Medicare Other | Admitting: Gastroenterology

## 2022-02-27 ENCOUNTER — Ambulatory Visit (INDEPENDENT_AMBULATORY_CARE_PROVIDER_SITE_OTHER): Payer: Medicare Other | Admitting: Family Medicine

## 2022-02-27 ENCOUNTER — Encounter: Payer: Self-pay | Admitting: Gastroenterology

## 2022-02-27 VITALS — BP 118/68 | HR 95 | Temp 97.6°F | Ht 64.0 in | Wt 136.6 lb

## 2022-02-27 VITALS — BP 108/56 | HR 98 | Temp 97.7°F | Ht 64.0 in | Wt 138.0 lb

## 2022-02-27 DIAGNOSIS — K56600 Partial intestinal obstruction, unspecified as to cause: Secondary | ICD-10-CM | POA: Diagnosis not present

## 2022-02-27 DIAGNOSIS — R933 Abnormal findings on diagnostic imaging of other parts of digestive tract: Secondary | ICD-10-CM | POA: Diagnosis not present

## 2022-02-27 DIAGNOSIS — J189 Pneumonia, unspecified organism: Secondary | ICD-10-CM | POA: Diagnosis not present

## 2022-02-27 DIAGNOSIS — K567 Ileus, unspecified: Secondary | ICD-10-CM | POA: Diagnosis not present

## 2022-02-27 NOTE — Telephone Encounter (Signed)
Pt informed that CT is scheduled for 03/04/22, need to arrive at 11:15 am to check in and nothing to eat 4 hours prior. Pt verbalized understanding

## 2022-02-27 NOTE — Assessment & Plan Note (Signed)
Ileus vs SBO vs constipation. Discussed case with Dr. Constance Haw, General surgery. She recommend repeat CT w oral and IV contrast. Possible colonoscopy. I have relayed recommendations to GI.  ER precautions given.

## 2022-02-27 NOTE — Progress Notes (Unsigned)
GI Office Note    Referring Provider: Coral Spikes, DO Primary Care Physician:  Coral Spikes, DO  Primary Gastroenterologist: Garfield Cornea, MD   Chief Complaint   Chief Complaint  Patient presents with   Follow-up    States a blockage was noted on recent imaging that she had done by urology    History of Present Illness   Deborah Gray is a 89 y.o. female presenting today for follow up, recently had abnormal CT imaging suggesting blockage. She has history of GERD, esophageal spasms/nutcracker esophagus, idiopathic chronic pancreatitis with chronic pancreatic exocrine insufficiency, IBS with intermittent diarrhea, hemorrhoids. She tries to avoid PPIs due to concerns for causing memory issues. After last EGD, has been on aciphex '20mg'$  daily.  Overall has been stable from a GI standpoint. Her reflux is well controlled. She started back on aciophex after EGD findings in 12/2021. She has not had any recent issues with esophageal spasms. Appetite has been good. She has used Levsin intermittently for abdominal cramping/diarrhea. Last significant issue 12/2021. She has been using imodium a bit more over the holidays to try and prevent diarrhea as she has been going out more. Taking one on days she goes out but two if she has had loose stools.   Recently had CT renal protocol for microscopic hematuria. On that study she was noted to have right lower lobe and middle lobe airspace opacities concerning for PNA or atelectasis. She had moderately dilated small bowel loops concerning for ileus or distal small bowel obstruction. Follow up chest xray concerning for PNA of right lung base.   She was started on Augmentin. She states other than sinus infection she has had no cough or sob. Stools more regular on aciphex. She has noted stools smaller in caliber at times. No melena, brbpr. No n/v.      CT renal stone 02/21/22:  IMPRESSION: -Right lower lobe and middle lobe airspace opacities are  noted concerning for pneumonia or atelectasis. -Moderately dilated small bowel loops are noted concerning for ileus or distal small bowel obstruction. -Sigmoid diverticulosis is noted without inflammation. Stool is noted throughout the colon. -Aortic Atherosclerosis (ICD10-I70.0).  EGD 12/2021: -Distal esophageal rings,2 tandem. Overlying distal esophageal erosion consistent with erosive reflux esophagitis. Status post dilation.  -Status post esophageal biopsy as described -Inflamed appearing stomach of uncertain significance diffusely status post biopsy normal duodenal bulb and second portion of the duodenum. -Patient not on any meaningful acid suppression therapy. She takes pancreatic enzymes as well. Concerns about dementia risk with PPIs previously. In this setting, the benefits of acid suppression therapy with a PPI far outweighs any theoretical risks of dementia.    Medications   Current Outpatient Medications  Medication Sig Dispense Refill   acetaminophen (TYLENOL 8 HOUR) 650 MG CR tablet Take 1 tablet (650 mg total) by mouth every 8 (eight) hours as needed for pain. 30 tablet 0   acetaminophen (TYLENOL) 500 MG tablet Take 500 mg by mouth at bedtime.     amoxicillin-clavulanate (AUGMENTIN) 875-125 MG tablet Take 1 tablet by mouth 2 (two) times daily. 14 tablet 0   ascorbic acid (VITAMIN C) 500 MG tablet Take 500 mg by mouth daily.     aspirin EC 81 MG tablet Take 81 mg by mouth at bedtime.     Azelastine-Fluticasone 137-50 MCG/ACT SUSP Place 1 spray into the nose every 12 (twelve) hours. 23 g 0   azithromycin (ZITHROMAX) 250 MG tablet Take 2 tablets on day  1, then 1 tablet daily on days 2 through 5 6 tablet 0   blood glucose meter kit and supplies KIT Dispense based on  insurance preference. Tests once a day E11.9 1 each 0   Carboxymethylcellulose Sodium (THERATEARS) 0.25 % SOLN Place 1 drop into both eyes 2 (two) times daily.     cholecalciferol (VITAMIN D3) 25 MCG (1000  UNIT) tablet Take 1,000 Units by mouth at bedtime.     clidinium-chlordiazePOXIDE (LIBRAX) 5-2.5 MG capsule TAKE 1 CAPSULE BY MOUTH 3 (THREE) TIMES DAILY AS NEEDED (FOR ESOPHAGEAL SYMPTOMS). 270 capsule 0   ezetimibe (ZETIA) 10 MG tablet TAKE 1 TABLET BY MOUTH EVERY DAY 90 tablet 1   Guaifenesin 1200 MG TB12 Take 1,200 mg by mouth daily with supper.     hyoscyamine (LEVSIN SL) 0.125 MG SL tablet USE 1 TABLET UNDER THE TONGUE BEFORE MEALS AND AT BEDTIME AS NEEDED FOR FLARES IN SYMPTOMS, DIARRHEA/ABDOMINAL CRAMPING 90 tablet 3   ketoconazole (NIZORAL) 2 % cream Apply 1 application topically daily as needed for irritation (to affected area. external use only). (Patient taking differently: Apply 1 application  topically daily.) 30 g 0   losartan (COZAAR) 50 MG tablet Take 1 tablet (50 mg total) by mouth daily. (Patient taking differently: Take 50 mg by mouth at bedtime.) 90 tablet 3   meclizine (ANTIVERT) 25 MG tablet Take 1 tablet (25 mg total) by mouth 3 (three) times daily as needed. (Patient taking differently: Take 25 mg by mouth 3 (three) times daily as needed for dizziness.) 30 tablet 1   metFORMIN (GLUCOPHAGE) 500 MG tablet TAKE ONE TABLET BY MOUTH EVERY MORNING ONE AT LUNCH AND 2 AT BEDTIME 360 tablet 1   methenamine (HIPREX) 1 g tablet TAKE 1 TABLET BY MOUTH 2 TIMES DAILY. 180 tablet 3   Multiple Vitamins-Minerals (PRESERVISION AREDS 2) CAPS Take 1 capsule by mouth 2 (two) times daily.      MYRBETRIQ 50 MG TB24 tablet TAKE 1 TABLET BY MOUTH EVERY DAY 90 tablet 3   nateglinide (STARLIX) 120 MG tablet TAKE 1 TABLET (120 MG TOTAL) BY MOUTH 3 (THREE) TIMES DAILY WITH MEALS 270 tablet 1   omega-3 acid ethyl esters (LOVAZA) 1 g capsule Take 2 g by mouth every morning.     Pancrelipase, Lip-Prot-Amyl, (CREON) 24000-76000 units CPEP TAKE 3 CAPSULES THREE TIMES DAILY WITH MEALS AND ONE CAPSULE WITH SNACK TWICE DAILY 1000 capsule 5   RABEprazole (ACIPHEX) 20 MG tablet Take 1 tablet (20 mg total) by mouth  daily. 30 tablet 11   sodium chloride (OCEAN) 0.65 % SOLN nasal spray Place 1 spray into both nostrils every morning.     No current facility-administered medications for this visit.    Allergies   Allergies as of 02/27/2022 - Review Complete 02/27/2022  Allergen Reaction Noted   Adhesive [tape] Other (See Comments) 07/21/2016   Estrogens Other (See Comments) 10/15/2012   Sulfonamide derivatives Rash 03/01/2008     Past Medical History   Past Medical History:  Diagnosis Date   Arthritis    Benign neoplasm of colon    Constipation    Diaphragmatic hernia without mention of obstruction or gangrene    Diverticulosis of colon (without mention of hemorrhage)    Dysphagia, pharyngoesophageal phase    Esophageal reflux    Essential hypertension    Heart murmur    Hemorrhoids    Hyperlipidemia    Internal hemorrhoids without mention of complication    Left wrist fracture  PONV (postoperative nausea and vomiting)    Stricture and stenosis of esophagus    Type 2 diabetes mellitus (HCC)    Unspecified gastritis and gastroduodenitis without mention of hemorrhage     Past Surgical History   Past Surgical History:  Procedure Laterality Date   ABDOMINAL HYSTERECTOMY     BACTERIAL OVERGROWTH TEST N/A 09/06/2015   Procedure: BACTERIAL OVERGROWTH TEST;  Surgeon: Daneil Dolin, MD;  [facility];  Service: Endoscopy;  Laterality: N/A;  800   BIOPSY  01/02/2022   Procedure: BIOPSY;  Surgeon: Daneil Dolin, MD;  [facility]: [facility];  Service: Endoscopy;;   BREAST LUMPECTOMY Right    benign   CATARACT EXTRACTION Bilateral 2006   Implants in both, surgeries done 6 weeks apart   CHOLECYSTECTOMY     COLONOSCOPY  03/08/02   [name]: diverticulosis, internal hemorrhoids, adenomatous colon polyp   COLONOSCOPY  01/23/04   [name]: diverticulosis, internal hemorrhoids   COLONOSCOPY  09/25/05   [name]: diverticulosis   COLONOSCOPY  07/05/09   [name]: severe  diverticulosis   COLONOSCOPY  01/21/11   [name]: severe diverticulosis in sigmoid to desc colon, int hemorrhoids, follow up TCS in 5 years   COLONOSCOPY N/A 04/10/2016   [name]: diverticulosis   CYSTOSCOPY W/ URETERAL STENT PLACEMENT Right 03/15/2021   Procedure: CYSTOSCOPY WITH RETROGRADE PYELOGRAM/URETERAL STENT PLACEMENT;  Surgeon: Janith Lima, MD;  Location: [facility];  Service: Urology;  Laterality: Right;   CYSTOSCOPY WITH RETROGRADE PYELOGRAM, URETEROSCOPY AND STENT PLACEMENT Right 03/29/2021   Procedure: CYSTOSCOPY WITH RETROGRADE PYELOGRAM, URETEROSCOPY AND STENT EXCHANGE;  Surgeon: Cleon Gustin, MD;  Location: [facility];  Service: Urology;  Laterality: Right;   DILATION AND CURETTAGE OF UTERUS     ESOPHAGEAL MANOMETRY  03/21/08   [place]: findings c/w Nutcracker Esophagus   ESOPHAGOGASTRODUODENOSCOPY  03/08/02   [name]: esophageal stricture, chronic gerd, s/p dilation.    ESOPHAGOGASTRODUODENOSCOPY  01/23/04   [name]: esophageal stricture, gastritis, hiatal hernia   ESOPHAGOGASTRODUODENOSCOPY  09/25/05   [name]: gastritis, benign bx, no H.pylori   ESOPHAGOGASTRODUODENOSCOPY  07/05/09   [name]: gastropathy, benign small bowel bx and gastric bx   ESOPHAGOGASTRODUODENOSCOPY N/A 05/15/2015   Dr. Gala Romney- diffuse moderate inflammation characterized by congestion (edema), erythema, and linear erosions was found in the entire examined stomach. bx= reactive gastropathy   ESOPHAGOGASTRODUODENOSCOPY (EGD) WITH PROPOFOL N/A 01/02/2022   Procedure: ESOPHAGOGASTRODUODENOSCOPY (EGD) WITH PROPOFOL;  Surgeon: Daneil Dolin, MD;  [facility];  Service: Endoscopy;  Laterality: N/A;  10:30am, asa 2   FOOT SURGERY Left    HEMORRHOID BANDING  2017   Dr.[name]   HOLMIUM LASER APPLICATION Right 10/25/2834   Procedure: HOLMIUM LASER APPLICATION;  Surgeon: Cleon Gustin, MD;  Location: [facility];  Service: Urology;  Laterality: Right;   LAPAROSCOPIC VAGINAL HYSTERECTOMY     MALONEY  DILATION N/A 01/02/2022   Procedure: Venia Minks DILATION;  Surgeon: Daneil Dolin, MD;  [facility];  Service: Endoscopy;  Laterality: N/A;   ORIF WRIST FRACTURE Left 06/23/2018   Procedure: OPEN REDUCTION INTERNAL FIXATION (ORIF) LEFT WRIST FRACTURE;  Surgeon: Renette Butters, MD;  Location: [place];  Service: Orthopedics;  Laterality: Left;  regional arm block   RECTOCELE REPAIR     TOTAL KNEE ARTHROPLASTY Right     Past Family History   Family History  Problem Relation Age of Onset   Ovarian cancer Mother    Diabetes Father    Heart disease Father  Breast cancer Sister    Liver cancer Sister    Colon cancer Cousin        Paternal side   Diabetes Maternal Aunt    Liver disease Cousin        Maternal side, never drank    Past Social History   Social History   Socioeconomic History   Marital status: Widowed    Spouse name: Not on file   Number of children: 3   Years of education: Not on file   Highest education level: Not on file  Occupational History   Occupation: Retired  Tobacco Use   Smoking status: Never   Smokeless tobacco: Never   Tobacco comments:    Never smoked  Vaping Use   Vaping Use: Never used  Substance and Sexual Activity   Alcohol use: No    Alcohol/week: 0.0 standard drinks of alcohol   Drug use: No   Sexual activity: Not Currently  Other Topics Concern   Not on file  Social History Narrative   Widowed since 07/2018. Married x 61 years.   3 sons. [place] and 1966.   Social Determinants of Health   Financial Resource Strain: Low Risk  (05/15/2021)   Overall Financial Resource Strain (CARDIA)    Difficulty of Paying Living Expenses: Not hard at all  Food Insecurity: No Food Insecurity (05/15/2021)   Hunger Vital Sign    Worried About Running Out of Food in the Last Year: Never true    Ran Out of Food in the Last Year: Never true  Transportation Needs: No Transportation Needs (05/15/2021)   PRAPARE -  Hydrologist (Medical): No    Lack of Transportation (Non-Medical): No  Physical Activity: Insufficiently Active (05/15/2021)   Exercise Vital Sign    Days of Exercise per Week: 5 days    Minutes of Exercise per Session: 20 min  Stress: No Stress Concern Present (05/15/2021)   [place]    Feeling of Stress : Not at all  Social Connections: Moderately Integrated (05/15/2021)   Social Connection and Isolation Panel [NHANES]    Frequency of Communication with Friends and Family: More than three times a week    Frequency of Social Gatherings with Friends and Family: More than three times a week    Attends Religious Services: More than 4 times per year    Active Member of [employer] or Organizations: Yes    Attends Archivist Meetings: More than 4 times per year    Marital Status: Widowed  Intimate Partner Violence: Not At Risk (05/15/2021)   Humiliation, Afraid, Rape, and Kick questionnaire    Fear of Current or Ex-Partner: No    Emotionally Abused: No    Physically Abused: No    Sexually Abused: No    Review of Systems   General: Negative for anorexia, weight loss, fever, chills, fatigue, weakness. ENT: Negative for hoarseness, difficulty swallowing , nasal congestion. See hpi CV: Negative for chest pain, angina, palpitations, dyspnea on exertion, peripheral edema.  Respiratory: Negative for dyspnea at rest, dyspnea on exertion, cough, sputum, wheezing.  GI: See history of present illness. GU:  Negative for dysuria, hematuria, urinary incontinence, urinary frequency, nocturnal urination.  Endo: Negative for unusual weight change.     Physical Exam   BP (!) 108/56 (BP Location: Right Arm, Patient Position: Sitting, Cuff Size: Normal)   Pulse 98   Temp 97.7 F (36.5 C) (Oral)  Ht '5\' 4"'$  (1.626 m)   Wt 138 lb (62.6 kg)   SpO2 97%   BMI 23.69 kg/m    General: Well-nourished,  well-developed in no acute distress.  Eyes: No icterus. Mouth: Oropharyngeal mucosa moist and pink   Abdomen: Bowel sounds are normal, nontender, nondistended, no hepatosplenomegaly or masses,  no abdominal bruits or hernia , no rebound or guarding.  Rectal: not performed Extremities: No lower extremity edema. No clubbing or deformities. Neuro: Alert and oriented x 4   Skin: Warm and dry, no jaundice.   Psych: Alert and cooperative, normal mood and affect.  Labs   Lab Results  Component Value Date   CREATININE 0.84 11/19/2021   BUN 27 11/19/2021   NA 141 11/19/2021   K 5.1 11/19/2021   CL 103 11/19/2021   CO2 23 11/19/2021   Lab Results  Component Value Date   WBC 6.7 11/19/2021   HGB 14.0 11/19/2021   HCT 41.9 11/19/2021   MCV 89 11/19/2021   PLT 264 11/19/2021   Lab Results  Component Value Date   ALT 15 11/19/2021   AST 13 11/19/2021   ALKPHOS 92 11/19/2021   BILITOT 0.5 11/19/2021    Imaging Studies   DG Chest 2 View  Result Date: 02/26/2022 CLINICAL DATA:  Abnormality of the lung base seen on recent CT scan. EXAM: CHEST - 2 VIEW COMPARISON:  CT urogram February 21, 2022 FINDINGS: The heart size and mediastinal contours are within normal limits. Patchy consolidation of right lung base is identified correlating to recent CT finding. The left lung is clear. The visualized skeletal structures are stable. IMPRESSION: Patchy consolidation/pneumonia of right lung base correlating to recent CT finding. Electronically Signed   By: Abelardo Diesel M.D.   On: 02/26/2022 13:21   CT RENAL STONE STUDY  Result Date: 02/21/2022 CLINICAL DATA:  Low back pain, microscopic hematuria. EXAM: CT ABDOMEN AND PELVIS WITHOUT CONTRAST TECHNIQUE: Multidetector CT imaging of the abdomen and pelvis was performed following the standard protocol without IV contrast. RADIATION DOSE REDUCTION: This exam was performed according to the departmental dose-optimization program which includes automated  exposure control, adjustment of the mA and/or kV according to patient size and/or use of iterative reconstruction technique. COMPARISON:  March 15, 2021. FINDINGS: Lower chest: Right lower lobe and middle lobe opacities are noted concerning for pneumonia or atelectasis. Hepatobiliary: No focal liver abnormality is seen. Status post cholecystectomy. No biliary dilatation. Pancreas: Pancreatic atrophy is noted. No acute abnormality is noted. Spleen: Normal in size without focal abnormality. Adrenals/Urinary Tract: Adrenal glands appear normal. Bilateral renal cysts are noted. No hydronephrosis or renal obstruction is noted. Urinary bladder is unremarkable. Stomach/Bowel: Stomach is unremarkable. Moderately dilated small bowel loops are noted concerning for ileus or distal small bowel obstruction. Sigmoid diverticulosis is noted without inflammation. Stool is noted throughout the colon. Vascular/Lymphatic: Aortic atherosclerosis. No enlarged abdominal or pelvic lymph nodes. Reproductive: Status post hysterectomy. No adnexal masses. Other: No abdominal wall hernia or abnormality. No abdominopelvic ascites. Musculoskeletal: Multilevel degenerative changes are noted. Old L1 compression fracture is noted. No acute osseous abnormality is noted. IMPRESSION: Right lower lobe and middle lobe airspace opacities are noted concerning for pneumonia or atelectasis. Moderately dilated small bowel loops are noted concerning for ileus or distal small bowel obstruction. Sigmoid diverticulosis is noted without inflammation. Stool is noted throughout the colon. Aortic Atherosclerosis (ICD10-I70.0). Electronically Signed   By: Marijo Conception M.D.   On: 02/21/2022 16:47    Assessment  GERD: doing well on aciphex.   Esophageal spasms/nutcracker esophagus: doing well. Using librax prn, rarely needs.   IBS-D: has used increased amounts of imodium over the holidays to prevent diarrhea. Using levsin as well for cramps/loose  stool/urgency. Recommend using Levsin sparingly right now. Hold off on imodium given current CT findings.   Chronic pancreatitis/EPI: doing well on Creon.   Abnormal small bowel on CT: ?ileus vs obstruction based on dilated small bowel loops. Patient denies symptoms suggestive of bowel obstructions. Dr. Thersa Salt discussed CT findings with Dr. Curlene Labrum (general surgery) who is advising CT A/P with oral and IV contrast. May require updated colonoscopy based on findings, change in stool caliber. Last colonoscopy 2018.    PLAN   CT A/P with contrast. Use Levsin sparingly. Avoid imodium for now. Continue aciphex '20mg'$  daily. Call if n/v, constipation.  Laureen Ochs. Bobby Rumpf, [place], [facility]

## 2022-02-27 NOTE — Progress Notes (Addendum)
Subjective:  Patient ID: Deborah Gray, female    DOB: February 28, 1933  Age: 89 y.o. MRN: [id number]  CC: Chief Complaint  Patient presents with   Follow-up    Pneumonia and intestinal issues    HPI:  89 year old female with the below mentioned medical problems presents for evaluation of the above.  Recent CT scan by Urology revealed the following: Right lower lobe and middle lobe airspace opacities are noted concerning for pneumonia or atelectasis.  Moderately dilated small bowel loops are noted concerning for ileus or distal small bowel obstruction. Sigmoid diverticulosis is noted without inflammation. Stool is noted throughout the colon.  Patient presents today for assessment. She is not having any respiratory symptoms. Chest xray was done and RLL infiltrate. No fever.  She reports chronic GI issues due to IBS. Has had smaller stool caliber in the past 1-2 weeks. No significant abdominal pain. No nausea or vomiting. No hematochezia or melena.  Has GI follow up this afternoon.    Patient Active Problem List   Diagnosis Date Noted   CAP (community acquired pneumonia) 02/27/2022   Ileus ([place]) 02/27/2022   History of UTI 02/21/2022   Nephrolithiasis 03/15/2021   IBS (irritable bowel syndrome) 01/30/2015   Type 2 diabetes mellitus ([place]) 12/22/2014   Hyperlipidemia, unspecified 12/22/2014   Essential hypertension 11/25/2013   Osteopenia 10/23/2012   Chronic pancreatitis ([place]) 02/26/2011   Gastroesophageal reflux disease 01/18/2011   Esophageal spasm 11/29/2010   DJD (degenerative joint disease) 11/29/2010   Hx of adenomatous colonic polyps 06/29/2009    Social Hx   Social History   Socioeconomic History   Marital status: Widowed    Spouse name: Not on file   Number of children: 3   Years of education: Not on file   Highest education level: Not on file  Occupational History   Occupation: Retired  Tobacco Use   Smoking status: Never   Smokeless tobacco: Never    Tobacco comments:    Never smoked  Vaping Use   Vaping Use: Never used  Substance and Sexual Activity   Alcohol use: No    Alcohol/week: 0.0 standard drinks of alcohol   Drug use: No   Sexual activity: Not Currently  Other Topics Concern   Not on file  Social History Narrative   Widowed since 07/2018. Married x 61 years.   3 sons. [place] and 1966.   Social Determinants of Health   Financial Resource Strain: Low Risk  (05/15/2021)   Overall Financial Resource Strain (CARDIA)    Difficulty of Paying Living Expenses: Not hard at all  Food Insecurity: No Food Insecurity (05/15/2021)   Hunger Vital Sign    Worried About Running Out of Food in the Last Year: Never true    Ran Out of Food in the Last Year: Never true  Transportation Needs: No Transportation Needs (05/15/2021)   PRAPARE - Hydrologist (Medical): No    Lack of Transportation (Non-Medical): No  Physical Activity: Insufficiently Active (05/15/2021)   Exercise Vital Sign    Days of Exercise per Week: 5 days    Minutes of Exercise per Session: 20 min  Stress: No Stress Concern Present (05/15/2021)   [place]    Feeling of Stress : Not at all  Social Connections: Moderately Integrated (05/15/2021)   Social Connection and Isolation Panel [NHANES]    Frequency of Communication with Friends and Family: More than  three times a week    Frequency of Social Gatherings with Friends and Family: More than three times a week    Attends Religious Services: More than 4 times per year    Active Member of Clubs or Organizations: Yes    Attends Archivist Meetings: More than 4 times per year    Marital Status: Widowed    Review of Systems Per HPI  Objective:  BP 118/68   Pulse 95   Temp 97.6 F (36.4 C) (Oral)   Ht '5\' 4"'$  (1.626 m)   Wt 136 lb 9.6 oz (62 kg)   SpO2 97%   BMI 23.45 kg/m      02/27/2022   11:18 AM  02/21/2022   11:26 AM 02/15/2022    4:15 PM  BP/Weight  Systolic BP 062 694 854  Diastolic BP 68 82 83  Wt. (Lbs) 136.6 140.4   BMI 23.45 kg/m2 24.1 kg/m2     Physical Exam Vitals and nursing note reviewed.  Constitutional:      General: She is not in acute distress.    Appearance: Normal appearance.  HENT:     Head: Normocephalic and atraumatic.  Cardiovascular:     Rate and Rhythm: Normal rate and regular rhythm.  Pulmonary:     Effort: Pulmonary effort is normal.     Breath sounds: Normal breath sounds. No wheezing, rhonchi or rales.  Abdominal:     General: There is no distension.     Palpations: Abdomen is soft.     Tenderness: There is no abdominal tenderness.  Neurological:     Mental Status: She is alert.     Lab Results  Component Value Date   WBC 6.7 11/19/2021   HGB 14.0 11/19/2021   HCT 41.9 11/19/2021   PLT 264 11/19/2021   GLUCOSE 142 (H) 11/19/2021   CHOL 167 11/19/2021   TRIG 71 11/19/2021   HDL 70 11/19/2021   LDLCALC 83 11/19/2021   ALT 15 11/19/2021   AST 13 11/19/2021   NA 141 11/19/2021   K 5.1 11/19/2021   CL 103 11/19/2021   CREATININE 0.84 11/19/2021   BUN 27 11/19/2021   CO2 23 11/19/2021   TSH 2.720 11/19/2021   INR 1.7 (H) 04/08/2008   HGBA1C 6.2 (H) 11/19/2021   MICROALBUR 1.0 11/22/2013     Assessment & Plan:   Problem List Items Addressed This Visit       Respiratory   CAP (community acquired pneumonia) - Primary    Given age and co-morbidities, treating with Augmentin and Azithromycin. This was sent last night.        Digestive   Ileus ([place])    Ileus vs SBO vs constipation. Discussed case with Dr. Constance Haw, General surgery. She recommend repeat CT w oral and IV contrast. Possible colonoscopy. I have relayed recommendations to GI.  ER precautions given.      Follow-up:  Return in about 1 week (around 03/06/2022).  [place]

## 2022-02-27 NOTE — Patient Instructions (Signed)
Antibiotics as prescribed.  I am going to reach out to general surgery and GI.  Follow up next week.  Take care  Dr. Lacinda Axon

## 2022-02-27 NOTE — Assessment & Plan Note (Signed)
Given age and co-morbidities, treating with Augmentin and Azithromycin. This was sent last night.

## 2022-02-27 NOTE — Patient Instructions (Signed)
CT scan as planned.  Call if you have nausea, vomiting, or constipation.  You can use Levsin sparingly for cramps or diarrhea. Try to avoid imodium for now.  Continue rabeprazole '20mg'$  daily for acid reflux.

## 2022-02-28 ENCOUNTER — Ambulatory Visit (INDEPENDENT_AMBULATORY_CARE_PROVIDER_SITE_OTHER): Payer: Medicare Other | Admitting: Urology

## 2022-02-28 DIAGNOSIS — N3010 Interstitial cystitis (chronic) without hematuria: Secondary | ICD-10-CM | POA: Diagnosis not present

## 2022-02-28 LAB — MICROSCOPIC EXAMINATION
Bacteria, UA: NONE SEEN
RBC, Urine: NONE SEEN /hpf (ref 0–2)

## 2022-02-28 LAB — URINALYSIS, ROUTINE W REFLEX MICROSCOPIC
Bilirubin, UA: NEGATIVE
Glucose, UA: NEGATIVE
Ketones, UA: NEGATIVE
Nitrite, UA: NEGATIVE
Protein,UA: NEGATIVE
RBC, UA: NEGATIVE
Specific Gravity, UA: 1.01 (ref 1.005–1.030)
Urobilinogen, Ur: 0.2 mg/dL (ref 0.2–1.0)
pH, UA: 6 (ref 5.0–7.5)

## 2022-02-28 MED ORDER — SODIUM BICARBONATE 8.4 % IV SOLN
50.0000 meq | Freq: Once | INTRAVENOUS | Status: AC
  Administered 2022-02-28: 50 meq

## 2022-02-28 MED ORDER — LIDOCAINE HCL 2 % IJ SOLN
10.0000 mL | Freq: Once | INTRAMUSCULAR | Status: AC
  Administered 2022-02-28: 200 mg

## 2022-02-28 MED ORDER — HYDROCORTISONE SOD SUC (PF) 100 MG IJ SOLR
100.0000 mg | Freq: Once | INTRAMUSCULAR | Status: AC
  Administered 2022-02-28: 100 mg

## 2022-02-28 MED ORDER — DIMETHYL SULFOXIDE 50 % IS SOLN
50.0000 mL | Freq: Once | INTRAVESICAL | Status: AC
  Administered 2022-02-28: 50 mL via URETHRAL

## 2022-02-28 NOTE — Telephone Encounter (Signed)
PA for [employer] pending. MBO#[id number] faxed clinicals

## 2022-02-28 NOTE — Progress Notes (Signed)
Bladder Instillation DMSO  Due to interstitial cystitis patient is present today for a Bladder Instillation of DMSO treatment. Patient was cleaned and prepped in a sterile fashion with betadine.  A 20FR catheter was inserted, urine return was noted 147m, urine was yellow in color.  DMSO treatment was instilled into the bladder. The catheter was then removed. Patient tolerated well, no complications were noted pt was instructed to hold the instillation for 20 minutes and then will void it out. Pt voiced understanding.    Preformed by: KLevi Aland CMA  Follow up/ Additional notes: Follow up as scheduled.

## 2022-02-28 NOTE — Telephone Encounter (Signed)
[place] PA # [id number], PA pending

## 2022-03-01 ENCOUNTER — Telehealth: Payer: Self-pay

## 2022-03-01 NOTE — Telephone Encounter (Signed)
Patient has been made aware per provider recommendations.

## 2022-03-01 NOTE — Telephone Encounter (Signed)
Please advise patient has picked up both antibiotics and is making sure she is supposed to take azithromycin and amoxicillin both, please advise.

## 2022-03-01 NOTE — Telephone Encounter (Signed)
[employer] PA: Order ID: [id number]       Authorized  Approval Valid Through: 02/28/2022 - 03/29/2022

## 2022-03-04 ENCOUNTER — Telehealth: Payer: Self-pay | Admitting: *Deleted

## 2022-03-04 ENCOUNTER — Encounter: Payer: Self-pay | Admitting: *Deleted

## 2022-03-04 ENCOUNTER — Ambulatory Visit (HOSPITAL_COMMUNITY): Payer: Medicare Other

## 2022-03-04 NOTE — Telephone Encounter (Signed)
Please remember she was ASAP CT when you go to reschedule.

## 2022-03-04 NOTE — Telephone Encounter (Signed)
Had to cancel CT that was scheduled for today, due to trying to get a PA from [facility]. It was approved but the NPI number for [facility] [place] is wrong. Tried calling [employer] and they are closed due to it being a holiday. I went in and entered the information manually, so hopefully we can get this fixed and get her rescheduled.  FYI

## 2022-03-05 DIAGNOSIS — E1142 Type 2 diabetes mellitus with diabetic polyneuropathy: Secondary | ICD-10-CM | POA: Diagnosis not present

## 2022-03-05 DIAGNOSIS — M79673 Pain in unspecified foot: Secondary | ICD-10-CM | POA: Diagnosis not present

## 2022-03-05 DIAGNOSIS — L851 Acquired keratosis [keratoderma] palmaris et plantaris: Secondary | ICD-10-CM | POA: Diagnosis not present

## 2022-03-05 DIAGNOSIS — B351 Tinea unguium: Secondary | ICD-10-CM | POA: Diagnosis not present

## 2022-03-07 ENCOUNTER — Encounter: Payer: Self-pay | Admitting: *Deleted

## 2022-03-07 ENCOUNTER — Ambulatory Visit (INDEPENDENT_AMBULATORY_CARE_PROVIDER_SITE_OTHER): Payer: Medicare Other | Admitting: Family Medicine

## 2022-03-07 VITALS — BP 139/76 | HR 94 | Temp 97.5°F | Ht 64.0 in | Wt 139.0 lb

## 2022-03-07 DIAGNOSIS — J189 Pneumonia, unspecified organism: Secondary | ICD-10-CM | POA: Diagnosis not present

## 2022-03-07 DIAGNOSIS — K5669 Other partial intestinal obstruction: Secondary | ICD-10-CM

## 2022-03-07 NOTE — Telephone Encounter (Signed)
Thank you.   Sent to Dr. Lacinda Axon for [place].

## 2022-03-07 NOTE — Progress Notes (Signed)
Subjective:  Patient ID: Deborah Gray, female    DOB: 23-Jul-1933  Age: 89 y.o. MRN: [id number]  CC: Chief Complaint  Patient presents with   acquired pneumonia follow up     Exp some sob with rushing     HPI:  89 year old female presents for follow-up.  Patient has completed her antibiotics for community-acquired pneumonia.  She states that she feels well at this time.  It has been recommended that she have a CT abdomen pelvis with contrast for further evaluation given recent abnormal CT findings concerning for bowel obstruction.  Patient has had a change in the caliber of her stool.  She is still passing stool.  No abdominal pain at this time.  CT has not been done as patient states that her insurance did not approve it.  We will work on this today.  Patient Active Problem List   Diagnosis Date Noted   Other partial intestinal obstruction ([place]) 03/07/2022   CAP (community acquired pneumonia) 02/27/2022   Abnormal CT scan, small bowel 02/27/2022   History of UTI 02/21/2022   Nephrolithiasis 03/15/2021   IBS (irritable bowel syndrome) 01/30/2015   Type 2 diabetes mellitus ([place]) 12/22/2014   Hyperlipidemia, unspecified 12/22/2014   Essential hypertension 11/25/2013   Osteopenia 10/23/2012   Chronic pancreatitis ([place]) 02/26/2011   Gastroesophageal reflux disease 01/18/2011   Esophageal spasm 11/29/2010   DJD (degenerative joint disease) 11/29/2010   Hx of adenomatous colonic polyps 06/29/2009    Social Hx   Social History   Socioeconomic History   Marital status: Widowed    Spouse name: Not on file   Number of children: 3   Years of education: Not on file   Highest education level: Not on file  Occupational History   Occupation: Retired  Tobacco Use   Smoking status: Never   Smokeless tobacco: Never   Tobacco comments:    Never smoked  Vaping Use   Vaping Use: Never used  Substance and Sexual Activity   Alcohol use: No    Alcohol/week: 0.0 standard drinks  of alcohol   Drug use: No   Sexual activity: Not Currently  Other Topics Concern   Not on file  Social History Narrative   Widowed since 07/2018. Married x 61 years.   3 sons. Saxton and 1966.   Social Determinants of Health   Financial Resource Strain: Low Risk  (05/15/2021)   Overall Financial Resource Strain (CARDIA)    Difficulty of Paying Living Expenses: Not hard at all  Food Insecurity: No Food Insecurity (05/15/2021)   Hunger Vital Sign    Worried About Running Out of Food in the Last Year: Never true    Ran Out of Food in the Last Year: Never true  Transportation Needs: No Transportation Needs (05/15/2021)   PRAPARE - Hydrologist (Medical): No    Lack of Transportation (Non-Medical): No  Physical Activity: Insufficiently Active (05/15/2021)   Exercise Vital Sign    Days of Exercise per Week: 5 days    Minutes of Exercise per Session: 20 min  Stress: No Stress Concern Present (05/15/2021)   [place]    Feeling of Stress : Not at all  Social Connections: Moderately Integrated (05/15/2021)   Social Connection and Isolation Panel [NHANES]    Frequency of Communication with Friends and Family: More than three times a week    Frequency of Social Gatherings with Friends and Family:  More than three times a week    Attends Religious Services: More than 4 times per year    Active Member of Clubs or Organizations: Yes    Attends Archivist Meetings: More than 4 times per year    Marital Status: Widowed    Review of Systems  Constitutional: Negative.   Gastrointestinal:  Negative for abdominal pain.   Objective:  BP 139/76   Pulse 94   Temp (!) 97.5 F (36.4 C)   Ht '5\' 4"'$  (1.626 m)   Wt 139 lb (63 kg)   SpO2 98%   BMI 23.86 kg/m      03/07/2022   11:21 AM 03/07/2022   11:11 AM 02/27/2022    2:30 PM  BP/Weight  Systolic BP 599 774 142  Diastolic BP 76 79 56   Wt. (Lbs)  139 138  BMI  23.86 kg/m2 23.69 kg/m2    Physical Exam Vitals and nursing note reviewed.  Constitutional:      General: She is not in acute distress.    Appearance: Normal appearance.  Cardiovascular:     Rate and Rhythm: Normal rate and regular rhythm.  Pulmonary:     Effort: Pulmonary effort is normal.     Breath sounds: Normal breath sounds. No wheezing, rhonchi or rales.  Abdominal:     General: There is no distension.     Palpations: Abdomen is soft.     Tenderness: There is no abdominal tenderness.  Neurological:     Mental Status: She is alert.  Psychiatric:        Mood and Affect: Mood normal.        Behavior: Behavior normal.     Lab Results  Component Value Date   WBC 6.7 11/19/2021   HGB 14.0 11/19/2021   HCT 41.9 11/19/2021   PLT 264 11/19/2021   GLUCOSE 142 (H) 11/19/2021   CHOL 167 11/19/2021   TRIG 71 11/19/2021   HDL 70 11/19/2021   LDLCALC 83 11/19/2021   ALT 15 11/19/2021   AST 13 11/19/2021   NA 141 11/19/2021   K 5.1 11/19/2021   CL 103 11/19/2021   CREATININE 0.84 11/19/2021   BUN 27 11/19/2021   CO2 23 11/19/2021   TSH 2.720 11/19/2021   INR 1.7 (H) 04/08/2008   HGBA1C 6.2 (H) 11/19/2021   MICROALBUR 1.0 11/22/2013     Assessment & Plan:   Problem List Items Addressed This Visit       Respiratory   CAP (community acquired pneumonia)    Doing well.  Has completed course of antibiotics.        Digestive   Other partial intestinal obstruction ([place]) - Primary    CT scan reordered.  Our staff is working on getting this approved and scheduled.      Relevant Orders   CT Abdomen Pelvis W Contrast    Follow-up: Pending CT results.  [place]

## 2022-03-07 NOTE — Assessment & Plan Note (Signed)
CT scan reordered.  Our staff is working on getting this approved and scheduled.

## 2022-03-07 NOTE — Telephone Encounter (Signed)
[facility] has the same NPI as [facility]

## 2022-03-07 NOTE — Telephone Encounter (Signed)
Deborah Gray/Deborah Gray, PCP reached out regarding pending CT approval. Patient seen by him again today and CT needed ASAP. There office started to work on approval too but may not be effective given our pending approval.   Can you please call insurance regarding this? We need CT asap.

## 2022-03-07 NOTE — Telephone Encounter (Signed)
Can we verify that NPI want be an issue now that CT is being done at another facility.

## 2022-03-07 NOTE — Telephone Encounter (Signed)
[facility] PA: Order ID: [id number]       Authorized  Approval Valid Through: 03/07/2022 - 04/05/2022

## 2022-03-07 NOTE — Patient Instructions (Signed)
We will call about the CT.  I will call with the results.  Take care  Dr. Lacinda Axon

## 2022-03-07 NOTE — Telephone Encounter (Signed)
Called [facility] and spoke with nurse reviewer. The prior PA had wrong npi/tax ID # reason appt cancelled. I was able to get PA approved. Auth# [id number], DOS 03/07/22-04/05/22. Confirmed the NPI for facility was correct.  Called pt, made aware approval received. She did not want to go today. I advised will see what they have for tomorrow.   When I called to get CT scheduled was advised Tammy already called and got on for Tuesday. Brett Fairy

## 2022-03-07 NOTE — Telephone Encounter (Signed)
Noted. PA still pending

## 2022-03-07 NOTE — Telephone Encounter (Signed)
Pt was scheduled for 03/12/22 at [facility] but states that she could not do that day because she had an appointment for her eyes. Called back and rescheduled pt for [facility] at 10 am on 03/10/21, nothing to eat or drink 4 hours prior but to drink a bottle of water on the way to have CT done. Pt was informed and verbalized understanding.

## 2022-03-07 NOTE — Assessment & Plan Note (Signed)
Doing well.  Has completed course of antibiotics.

## 2022-03-08 ENCOUNTER — Telehealth: Payer: Self-pay | Admitting: *Deleted

## 2022-03-08 NOTE — Telephone Encounter (Signed)
[employer] PA: Lanelle Bal # [id number], DOS:02/28/22-08/29/22

## 2022-03-10 ENCOUNTER — Ambulatory Visit (HOSPITAL_BASED_OUTPATIENT_CLINIC_OR_DEPARTMENT_OTHER)
Admission: RE | Admit: 2022-03-10 | Discharge: 2022-03-10 | Disposition: A | Discharge status: Home / Self Care | Payer: Medicare Other | Source: Ambulatory Visit | Attending: Family Medicine | Admitting: Family Medicine

## 2022-03-10 DIAGNOSIS — K56600 Partial intestinal obstruction, unspecified as to cause: Secondary | ICD-10-CM | POA: Insufficient documentation

## 2022-03-10 LAB — POCT I-STAT CREATININE: Creatinine, Ser: 0.6 mg/dL (ref 0.44–1.00)

## 2022-03-10 MED ORDER — IOHEXOL 300 MG/ML  SOLN
100.0000 mL | Freq: Once | INTRAMUSCULAR | Status: AC | PRN
  Administered 2022-03-10: 80 mL via INTRAVENOUS

## 2022-03-11 ENCOUNTER — Telehealth: Payer: Self-pay

## 2022-03-11 NOTE — Telephone Encounter (Signed)
Noted. Result note to follow.

## 2022-03-11 NOTE — Telephone Encounter (Signed)
[facility] called with report on pt's recent CT.

## 2022-03-12 ENCOUNTER — Inpatient Hospital Stay (HOSPITAL_COMMUNITY): Admission: RE | Admit: 2022-03-12 | Payer: 59 | Source: Ambulatory Visit

## 2022-03-12 DIAGNOSIS — H34832 Tributary (branch) retinal vein occlusion, left eye, with macular edema: Secondary | ICD-10-CM | POA: Diagnosis not present

## 2022-03-13 ENCOUNTER — Telehealth: Payer: Self-pay

## 2022-03-13 NOTE — Telephone Encounter (Signed)
Please let patient know the delay is because I am waiting for Dr. Gala Romney to take a look at CT too.   Overall CT is improved with no significant dilation of small or large bowels. There is moderate amount of stool load present. She has pulmonary nodule and persistent findings in the right lower lung that she was recently treated for when they suspected pneumonia. Radiology is recommending chest CT but will want her PCP to follow that.   Once I hear back from Dr. Gala Romney, we will be in touch. Hopefully by tomorrow.

## 2022-03-13 NOTE — Telephone Encounter (Signed)
Pt called wanting to know the results to her recent CT.

## 2022-03-14 ENCOUNTER — Telehealth: Payer: Self-pay

## 2022-03-14 NOTE — Telephone Encounter (Signed)
Patient called and stated that the CT Abdomen Pelvis w/contrast showed she had a Pulmonary nodule within the left lung. Patient was informed to let her PCP know.

## 2022-03-14 NOTE — Telephone Encounter (Signed)
Pt was made aware and verbalized understanding.  

## 2022-03-15 ENCOUNTER — Telehealth: Payer: Self-pay | Admitting: Family Medicine

## 2022-03-15 ENCOUNTER — Other Ambulatory Visit: Payer: Self-pay | Admitting: Family Medicine

## 2022-03-15 DIAGNOSIS — R911 Solitary pulmonary nodule: Secondary | ICD-10-CM

## 2022-03-15 DIAGNOSIS — R9389 Abnormal findings on diagnostic imaging of other specified body structures: Secondary | ICD-10-CM

## 2022-03-15 NOTE — Telephone Encounter (Signed)
Please let pt know that Dr. Gala Romney in agreement.   From a GI standpoint things actually look pretty good on the CT. No evidence of bowel obstruction. She has moderate stool burden so we need to make sure she is moving her bowels regularly. Try to limit imodium, she recently increased use over the holidays.   She will need to follow up with PCP for pulmonary nodule in left upper loabe and persistent airspace disease in lingula and right lower lobe.   Have her follow up with Dr. Gala Romney in 4 months.

## 2022-03-15 NOTE — Telephone Encounter (Signed)
Patient wanting to know if you receive scan on her intestine. Please advise

## 2022-03-18 NOTE — Telephone Encounter (Signed)
Pt was made aware and verbalized understanding.  Amanda: please schedule a follow up with Dr. Gala Romney in 4 mths.

## 2022-03-20 ENCOUNTER — Ambulatory Visit (HOSPITAL_COMMUNITY)
Admission: RE | Admit: 2022-03-20 | Discharge: 2022-03-20 | Disposition: A | Discharge status: Home / Self Care | Payer: Medicare Other | Source: Ambulatory Visit | Attending: Family Medicine | Admitting: Family Medicine

## 2022-03-20 DIAGNOSIS — R911 Solitary pulmonary nodule: Secondary | ICD-10-CM | POA: Insufficient documentation

## 2022-03-20 DIAGNOSIS — R918 Other nonspecific abnormal finding of lung field: Secondary | ICD-10-CM | POA: Diagnosis not present

## 2022-03-20 NOTE — Addendum Note (Signed)
Addended by: Dairl Ponder on: 03/20/2022 04:45 PM   Modules accepted: Orders

## 2022-03-21 ENCOUNTER — Encounter: Payer: Self-pay | Admitting: Internal Medicine

## 2022-03-21 ENCOUNTER — Ambulatory Visit (INDEPENDENT_AMBULATORY_CARE_PROVIDER_SITE_OTHER): Payer: Medicare Other | Admitting: Internal Medicine

## 2022-03-21 ENCOUNTER — Other Ambulatory Visit: Payer: Self-pay | Admitting: Family Medicine

## 2022-03-21 VITALS — BP 138/80 | HR 90 | Temp 98.2°F | Ht 64.0 in | Wt 138.2 lb

## 2022-03-21 DIAGNOSIS — R059 Cough, unspecified: Secondary | ICD-10-CM

## 2022-03-21 DIAGNOSIS — R918 Other nonspecific abnormal finding of lung field: Secondary | ICD-10-CM | POA: Insufficient documentation

## 2022-03-21 MED ORDER — METHYLPREDNISOLONE ACETATE 80 MG/ML IJ SUSP: 120.0000 mg | Freq: Once | INTRAMUSCULAR | Status: DC

## 2022-03-21 NOTE — Progress Notes (Signed)
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      History of Present Illness  03/21/2022  Pulmonary/ 1st office eval/ Mikayla Chiusano / [place] [facility]  Chief Complaint  Patient presents with   Consult    Ct scan abnormal   Dyspnea:  [place]/ no cooking / limited by back but still doing [employer] / [employer]c parking depends on whether back bothering her  Cough: none  but always botherd by nasal congestion f/u by Benjamine Mola  Sleep: occ night sweats x 2 year / no wt loss or resp cc noct  SABA use: none  02: none   No obvious day to day or daytime pattern/variability or assoc excess/ purulent sputum or mucus plugs or hemoptysis or cp or chest tightness, subjective wheeze or overt sinus or hb symptoms.   sleeping without nocturnal  or early am exacerbation  of respiratory  c/o's or need for noct saba. Also denies any obvious fluctuation of symptoms with weather or environmental changes or other aggravating or alleviating factors except as outlined above   No unusual exposure hx or h/o childhood pna/ asthma or knowledge of premature birth.  Current Allergies, Complete Past Medical History, Past Surgical History, Family History, and Social History were reviewed in [employer] record.  ROS  The following are not active complaints unless bolded Hoarseness, sore throat, dysphagia, dental problems, itching, sneezing,  nasal congestion or discharge of excess mucus or purulent secretions, ear ache,   fever, chills, unintended wt loss or wt gain, classically pleuritic or exertional cp,  orthopnea pnd or arm/hand swelling  or leg swelling, presyncope, palpitations, abdominal pain, anorexia, nausea, vomiting, diarrhea  or change in bowel habits or change in  bladder habits, change in stools or change in urine, dysuria, hematuria,  rash, arthralgias, visual complaints, headache, numbness, weakness or ataxia or problems with walking or coordination,  change in mood or  memory.                Past Medical History:  Diagnosis Date   Arthritis    Benign neoplasm of colon    Constipation    Diaphragmatic hernia without mention of obstruction or gangrene    Diverticulosis of colon (without mention of hemorrhage)    Dysphagia, pharyngoesophageal phase    Esophageal reflux    Essential hypertension    Heart murmur    Hemorrhoids    Hyperlipidemia    Internal hemorrhoids without mention of complication    Left wrist fracture    PONV (postoperative nausea and vomiting)    Stricture and stenosis of esophagus    Type 2 diabetes mellitus (HCC)    Unspecified gastritis and gastroduodenitis without mention of hemorrhage     Outpatient Medications Prior to Visit  Medication Sig Dispense Refill   acetaminophen (TYLENOL 8 HOUR) 650 MG CR tablet Take 1 tablet (650 mg total) by mouth every 8 (eight) hours as needed for pain. 30 tablet 0   acetaminophen (TYLENOL) 500 MG tablet Take 500 mg by mouth at bedtime.     amoxicillin-clavulanate (AUGMENTIN) 875-125 MG tablet Take 1 tablet by mouth 2 (two) times daily. 14 tablet 0   ascorbic acid (VITAMIN C) 500 MG tablet Take 500 mg by  mouth daily.     aspirin EC 81 MG tablet Take 81 mg by mouth at bedtime.     Azelastine-Fluticasone 137-50 MCG/ACT SUSP Place 1 spray into the nose every 12 (twelve) hours. 23 g 0   blood glucose meter kit and supplies KIT Dispense based on  insurance preference. Tests once a day E11.9 1 each 0   Carboxymethylcellulose Sodium (THERATEARS) 0.25 % SOLN Place 1 drop into both eyes 2 (two) times daily.     cholecalciferol (VITAMIN D3) 25 MCG (1000 UNIT) tablet Take 1,000 Units by mouth at bedtime.     clidinium-chlordiazePOXIDE (LIBRAX) 5-2.5 MG capsule TAKE 1 CAPSULE BY MOUTH 3  (THREE) TIMES DAILY AS NEEDED (FOR ESOPHAGEAL SYMPTOMS). 270 capsule 0   ezetimibe (ZETIA) 10 MG tablet TAKE 1 TABLET BY MOUTH EVERY DAY 90 tablet 1   Guaifenesin 1200 MG TB12 Take 1,200 mg by mouth daily with supper.     hyoscyamine (LEVSIN SL) 0.125 MG SL tablet USE 1 TABLET UNDER THE TONGUE BEFORE MEALS AND AT BEDTIME AS NEEDED FOR FLARES IN SYMPTOMS, DIARRHEA/ABDOMINAL CRAMPING 90 tablet 3   ketoconazole (NIZORAL) 2 % cream Apply 1 application topically daily as needed for irritation (to affected area. external use only). (Patient taking differently: Apply 1 application  topically daily.) 30 g 0   losartan (COZAAR) 50 MG tablet Take 1 tablet (50 mg total) by mouth daily. (Patient taking differently: Take 50 mg by mouth at bedtime.) 90 tablet 3   meclizine (ANTIVERT) 25 MG tablet Take 1 tablet (25 mg total) by mouth 3 (three) times daily as needed. (Patient taking differently: Take 25 mg by mouth 3 (three) times daily as needed for dizziness.) 30 tablet 1   metFORMIN (GLUCOPHAGE) 500 MG tablet TAKE ONE TABLET BY MOUTH EVERY MORNING ONE AT LUNCH AND 2 AT BEDTIME 360 tablet 1   methenamine (HIPREX) 1 g tablet TAKE 1 TABLET BY MOUTH 2 TIMES DAILY. 180 tablet 3   Multiple Vitamins-Minerals (PRESERVISION AREDS 2) CAPS Take 1 capsule by mouth 2 (two) times daily.      MYRBETRIQ 50 MG TB24 tablet TAKE 1 TABLET BY MOUTH EVERY DAY 90 tablet 3   nateglinide (STARLIX) 120 MG tablet TAKE 1 TABLET (120 MG TOTAL) BY MOUTH 3 (THREE) TIMES DAILY WITH MEALS 270 tablet 1   omega-3 acid ethyl esters (LOVAZA) 1 g capsule Take 2 g by mouth every morning.     Pancrelipase, Lip-Prot-Amyl, (CREON) 24000-76000 units CPEP TAKE 3 CAPSULES THREE TIMES DAILY WITH MEALS AND ONE CAPSULE WITH SNACK TWICE DAILY 1000 capsule 5   RABEprazole (ACIPHEX) 20 MG tablet Take 1 tablet (20 mg total) by mouth daily. 30 tablet 11   sodium chloride (OCEAN) 0.65 % SOLN nasal spray Place 1 spray into both nostrils every morning.     No  facility-administered medications prior to visit.     Objective:     BP 138/80   Pulse 90   Temp 98.2 F (36.8 C)   Ht 5' 4"  (1.626 m)   Wt 138 lb 3.2 oz (62.7 kg)   SpO2 95% Comment: ra  BMI 23.72 kg/m   SpO2: 95 % (ra)  Pleasantly verbose in a circumferential speech pattern in nad   HEENT : Oropharynx  clear      Nasal turbinates min non-specific turbinate edema   NECK :  without  apparent JVD/ palpable Nodes/TM    LUNGS: no acc muscle use,  Nl contour chest which is clear to A and  P bilaterally without cough on insp or exp maneuvers   CV:  RRR  no s3 or murmur or increase in P2, and no edema   ABD:  soft and nontender    MS:  slow stooped over slt awkward gait ext warm without deformities Or obvious joint restrictions  calf tenderness, cyanosis or clubbing    SKIN: warm and dry without lesions    NEURO:  alert, approp, nl sensorium with  no motor or cerebellar deficits apparent.    I personally reviewed images and agree with radiology impression as follows:   Chest CT chest s contrast 03/20/22 1. 15 x 12 mm irregular lesion involving the posterior aspect of the left upper lobe abutting the major fissure. This does contain calcifications and certainly could be a benign lesion such as granuloma or scarring change. 2. Patchy areas of tree-in-bud type nodularity suggesting chronic inflammation or atypical infection such as MAC. 3. More focal airspace consolidation and bronchiectasis in the right middle lobe may be related to post pneumonic changes. 4. More extensive nodular infiltrate in the right lower lobe with some filling of the lower lobe bronchi which could suggest aspiration. 5. Recommend a post treatment follow-up chest CT in 3 months. 6. Cirrhotic changes involving the liver but no hepatic lesions. 7. Aortic atherosclerosis.   Aortic Atherosclerosis (ICD10-I70.0).    Assessment   Multiple pulmonary nodules determined by computed tomography of  lung See incidental CT chest 03/20/22 during w/u for hematuria -  03/21/2022  ESR - 03/21/2022  Quant TB  - 03/21/2022 cbc with diff   MAI likely here and this  is an extremely common benign condition in the elderly and does not warrant aggressive eval/ rx at this point unless there is a clinical correlation suggesting unaddressed pulmonary infection (purulent sputum, night sweats, unintended wt loss, doe) or evolution of  obvious changes on plain cxr (as opposed to serial CT, which is way over sensitive to make clinical decisions re intervention and treatment in the elderly, who tend to tolerate both dx and treatment poorly) .   Rec f/u ct at 3 months but if no clinical decline or other evidence of ongoing infection rec f/u with simple cxr/ ov q 6 m thereafter  Discussed in detail all the  indications, usual  risks and alternatives  relative to the benefits with patient who agrees to proceed with w/u as outlined.            Each maintenance medication was reviewed in detail including emphasizing most importantly the difference between maintenance and prns and under what circumstances the prns are to be triggered using an action plan format where appropriate.  Total time for H and P, chart review, counseling, and generating customized AVS unique to this office visit / same day charting = 34 min new pt eval           Christinia Gully, MD 03/21/2022

## 2022-03-21 NOTE — Assessment & Plan Note (Signed)
See incidental CT chest 03/20/22 during w/u for hematuria -  03/21/2022  ESR - 03/21/2022  Quant TB  - 03/21/2022 cbc with diff   MAI likely here and this  is an extremely common benign condition in the elderly and does not warrant aggressive eval/ rx at this point unless there is a clinical correlation suggesting unaddressed pulmonary infection (purulent sputum, night sweats, unintended wt loss, doe) or evolution of  obvious changes on plain cxr (as opposed to serial CT, which is way over sensitive to make clinical decisions re intervention and treatment in the elderly, who tend to tolerate both dx and treatment poorly) .   Rec f/u ct at 3 months but if no clinical decline or other evidence of ongoing infection rec f/u with simple cxr/ ov q 6 m thereafter  Discussed in detail all the  indications, usual  risks and alternatives  relative to the benefits with patient who agrees to proceed with w/u as outlined.            Each maintenance medication was reviewed in detail including emphasizing most importantly the difference between maintenance and prns and under what circumstances the prns are to be triggered using an action plan format where appropriate.  Total time for H and P, chart review, counseling, and generating customized AVS unique to this office visit / same day charting = 34 min new pt eval

## 2022-03-21 NOTE — Patient Instructions (Addendum)
You multiple nodules that the other with largest calcified typical of a benign process   To be sure, we will repeat your CT no contrast  in 3 months and see you back afterwords    Please remember to go to the lab department   for your tests - we will call you with the results when they are available.      Please schedule a follow up visit in 3 months but call sooner if needed

## 2022-03-26 LAB — CBC WITH DIFFERENTIAL/PLATELET
Basophils Absolute: 0 10*3/uL (ref 0.0–0.2)
Basos: 1 %
EOS (ABSOLUTE): 0.1 10*3/uL (ref 0.0–0.4)
Eos: 2 %
Hematocrit: 38.2 % (ref 34.0–46.6)
Hemoglobin: 13 g/dL (ref 11.1–15.9)
Immature Grans (Abs): 0 10*3/uL (ref 0.0–0.1)
Immature Granulocytes: 0 %
Lymphocytes Absolute: 1.6 10*3/uL (ref 0.7–3.1)
Lymphs: 21 %
MCH: 29.3 pg (ref 26.6–33.0)
MCHC: 34 g/dL (ref 31.5–35.7)
MCV: 86 fL (ref 79–97)
Monocytes Absolute: 0.6 10*3/uL (ref 0.1–0.9)
Monocytes: 9 %
Neutrophils Absolute: 5.1 10*3/uL (ref 1.4–7.0)
Neutrophils: 67 %
Platelets: 228 10*3/uL (ref 150–450)
RBC: 4.44 x10E6/uL (ref 3.77–5.28)
RDW: 13.3 % (ref 11.7–15.4)
WBC: 7.5 10*3/uL (ref 3.4–10.8)

## 2022-03-26 LAB — QUANTIFERON-TB GOLD PLUS
QuantiFERON Mitogen Value: >10 IU/mL
QuantiFERON Nil Value: 0 IU/mL
QuantiFERON TB1 Ag Value: 0 IU/mL
QuantiFERON TB2 Ag Value: 0 IU/mL
QuantiFERON-TB Gold Plus: NEGATIVE

## 2022-03-26 LAB — SEDIMENTATION RATE: Sed Rate: 9 mm/hr (ref 0–40)

## 2022-03-28 ENCOUNTER — Ambulatory Visit (INDEPENDENT_AMBULATORY_CARE_PROVIDER_SITE_OTHER): Payer: Medicare Other | Admitting: Urology

## 2022-03-28 ENCOUNTER — Telehealth: Payer: Self-pay

## 2022-03-28 DIAGNOSIS — N3010 Interstitial cystitis (chronic) without hematuria: Secondary | ICD-10-CM

## 2022-03-28 LAB — URINALYSIS, ROUTINE W REFLEX MICROSCOPIC
Bilirubin, UA: NEGATIVE
Glucose, UA: NEGATIVE
Ketones, UA: NEGATIVE
Nitrite, UA: NEGATIVE
Protein,UA: NEGATIVE
RBC, UA: NEGATIVE
Specific Gravity, UA: 1.01 (ref 1.005–1.030)
Urobilinogen, Ur: 0.2 mg/dL (ref 0.2–1.0)
pH, UA: 5.5 (ref 5.0–7.5)

## 2022-03-28 LAB — MICROSCOPIC EXAMINATION: Bacteria, UA: NONE SEEN

## 2022-03-28 MED ORDER — NITROFURANTOIN MONOHYD MACRO 100 MG PO CAPS: 100.0000 mg | ORAL_CAPSULE | Freq: Two times a day (BID) | ORAL | 0 refills | Status: DC

## 2022-03-28 NOTE — Telephone Encounter (Signed)
Patient is aware that Macrobid 100 mg 2 x BID for 7 days was sent to her pharmacy and her urine will be sent for culture. Patient voiced understanding

## 2022-03-28 NOTE — Telephone Encounter (Signed)
Patient called advising she is having dysuria and wanted to ensure the does not have an infection prior to her treatment Monday.

## 2022-03-28 NOTE — Progress Notes (Signed)
Patient came in to do a urine drop off because she express she was burning and want to make sure everything was go to so she can proceed with her treatment on Monday.

## 2022-03-30 LAB — URINE CULTURE

## 2022-04-01 ENCOUNTER — Telehealth: Payer: Self-pay

## 2022-04-01 ENCOUNTER — Ambulatory Visit (INDEPENDENT_AMBULATORY_CARE_PROVIDER_SITE_OTHER): Payer: Medicare Other | Admitting: Urology

## 2022-04-01 DIAGNOSIS — N3010 Interstitial cystitis (chronic) without hematuria: Secondary | ICD-10-CM

## 2022-04-01 MED ORDER — SODIUM BICARBONATE 8.4 % IV SOLN
50.0000 meq | Freq: Once | INTRAVENOUS | Status: AC
  Administered 2022-04-01: 50 meq

## 2022-04-01 MED ORDER — HYDROCORTISONE SOD SUC (PF) 100 MG IJ SOLR
100.0000 mg | Freq: Once | INTRAMUSCULAR | Status: AC
  Administered 2022-04-01: 100 mg

## 2022-04-01 MED ORDER — DIMETHYL SULFOXIDE 50 % IS SOLN
50.0000 mL | Freq: Once | INTRAVESICAL | Status: AC
  Administered 2022-04-01: 50 mL via URETHRAL

## 2022-04-01 MED ORDER — LIDOCAINE HCL 1 % IJ SOLN
20.0000 mL | Freq: Once | INTRAMUSCULAR | Status: AC
  Administered 2022-04-01: 20 mL

## 2022-04-01 NOTE — Telephone Encounter (Signed)
Patient was made aware in person. Patient voiced understanding

## 2022-04-01 NOTE — Progress Notes (Unsigned)
Bladder Instillation DMSO  Due to interstitial cystitis patient is present today for a Bladder Instillation of DMSO treatment. Patient was cleaned and prepped in a sterile fashion with betadine.  A 20FR catheter was inserted, urine return was noted 70m, urine was yellow in color.  DMSO treatment was instilled into the bladder. The catheter was then removed. Patient tolerated well, no complications were noted pt was instructed to hold the instillation for 20 minutes and then will void it out. Pt voiced understanding.    Preformed by: KMauricia Area CMA  Follow up/ Additional notes: Follow up as scheduled.

## 2022-04-01 NOTE — Telephone Encounter (Signed)
-----   Message from Irine Seal, MD sent at 04/01/2022  2:40 PM EST ----- Small growth of Mx species is consistent with a contaminant and no further treatment is indicated.  ----- Message ----- From: Sherrilyn Rist, CMA Sent: 04/01/2022   9:59 AM EST To: Irine Seal, MD  Please review

## 2022-04-02 LAB — URINALYSIS, ROUTINE W REFLEX MICROSCOPIC
Bilirubin, UA: NEGATIVE
Glucose, UA: NEGATIVE
Leukocytes,UA: NEGATIVE
Nitrite, UA: NEGATIVE
RBC, UA: NEGATIVE
Specific Gravity, UA: 1.02 (ref 1.005–1.030)
Urobilinogen, Ur: 0.2 mg/dL (ref 0.2–1.0)
pH, UA: 5 (ref 5.0–7.5)

## 2022-04-04 ENCOUNTER — Ambulatory Visit (INDEPENDENT_AMBULATORY_CARE_PROVIDER_SITE_OTHER): Payer: Medicare Other | Admitting: Family Medicine

## 2022-04-04 VITALS — BP 130/82 | Ht 64.0 in | Wt 138.6 lb

## 2022-04-04 DIAGNOSIS — E1100 Type 2 diabetes mellitus with hyperosmolarity without nonketotic hyperglycemic-hyperosmolar coma (NKHHC): Secondary | ICD-10-CM

## 2022-04-04 DIAGNOSIS — E119 Type 2 diabetes mellitus without complications: Secondary | ICD-10-CM

## 2022-04-04 DIAGNOSIS — R61 Generalized hyperhidrosis: Secondary | ICD-10-CM

## 2022-04-04 MED ORDER — NATEGLINIDE 60 MG PO TABS: 60.0000 mg | ORAL_TABLET | Freq: Three times a day (TID) | ORAL | 1 refills | Status: DC

## 2022-04-04 NOTE — Assessment & Plan Note (Signed)
Uncertain etiology.  Patient has had recent CT imaging of the chest as well as the abdomen pelvis.  There have been no findings suggestive of malignancy.  No weight loss.  I will reach out to pulmonology to get input regarding recent lung findings.

## 2022-04-04 NOTE — Patient Instructions (Signed)
I have decreased the Starlix.  Continue the metformin.  Take care  Dr. Lacinda Axon

## 2022-04-04 NOTE — Assessment & Plan Note (Addendum)
Based on history, it appears that the patient experienced hypoglycemia.  I suspect that this is due to Starlix. Decreasing Starlix.  Recommended discontinuation but patient is quite reluctant regarding this.  A1c today.

## 2022-04-04 NOTE — Progress Notes (Signed)
Subjective:  Patient ID: Deborah Gray, female    DOB: 1934/01/28  Age: 89 y.o. MRN: [id number]  CC: Chief Complaint  Patient presents with   Blood Sugar Problem    Patient states she has had 2 spells in the last week where her sugar dropped on her She broke out in bad sweats and got hot and felt bad weak shaky and heart racing Patient states she has been having problems with sugars running up and down    HPI:  89 year old female presents for evaluation of the above.  Patient has underlying type 2 diabetes.  She is currently on metformin and Starlix.  She has been on Starlix for many years.  I previously recommended possible decrease or discontinuation given her age.  She states that recently she had 2 episodes where she broke out in sweat and felt hot and shaky.  She states that her heart was racing.  Her blood pressure was taken and it was normal.  She believes that she was experiencing hypoglycemia.  Patient also reports ongoing night sweats.  She has had these for quite a long time.  She reported these to me in March of last year.  At that time she stated that they had been going on for over a year.  She has not lost any weight.  Her weight in March of last year was 133 pounds.  Prior laboratory workup was unremarkable.  Patient Active Problem List   Diagnosis Date Noted   Night sweats 04/04/2022   Multiple pulmonary nodules determined by computed tomography of lung 03/21/2022   Other partial intestinal obstruction ([place]) 03/07/2022   History of UTI 02/21/2022   Nephrolithiasis 03/15/2021   IBS (irritable bowel syndrome) 01/30/2015   Type 2 diabetes mellitus ([place]) 12/22/2014   Hyperlipidemia, unspecified 12/22/2014   Essential hypertension 11/25/2013   Osteopenia 10/23/2012   Chronic pancreatitis ([place]) 02/26/2011   Gastroesophageal reflux disease 01/18/2011   DJD (degenerative joint disease) 11/29/2010   Hx of adenomatous colonic polyps 06/29/2009    Social Hx   Social  History   Socioeconomic History   Marital status: Widowed    Spouse name: Not on file   Number of children: 3   Years of education: Not on file   Highest education level: Not on file  Occupational History   Occupation: Retired  Tobacco Use   Smoking status: Never   Smokeless tobacco: Never   Tobacco comments:    Never smoked  Vaping Use   Vaping Use: Never used  Substance and Sexual Activity   Alcohol use: No    Alcohol/week: 0.0 standard drinks of alcohol   Drug use: No   Sexual activity: Not Currently  Other Topics Concern   Not on file  Social History Narrative   Widowed since 07/2018. Married x 61 years.   3 sons. [place] and 1966.   Social Determinants of Health   Financial Resource Strain: Low Risk  (05/15/2021)   Overall Financial Resource Strain (CARDIA)    Difficulty of Paying Living Expenses: Not hard at all  Food Insecurity: No Food Insecurity (05/15/2021)   Hunger Vital Sign    Worried About Running Out of Food in the Last Year: Never true    Ran Out of Food in the Last Year: Never true  Transportation Needs: No Transportation Needs (05/15/2021)   PRAPARE - Hydrologist (Medical): No    Lack of Transportation (Non-Medical): No  Physical Activity: Insufficiently Active (  05/15/2021)   Exercise Vital Sign    Days of Exercise per Week: 5 days    Minutes of Exercise per Session: 20 min  Stress: No Stress Concern Present (05/15/2021)   [place]    Feeling of Stress : Not at all  Social Connections: Moderately Integrated (05/15/2021)   Social Connection and Isolation Panel [NHANES]    Frequency of Communication with Friends and Family: More than three times a week    Frequency of Social Gatherings with Friends and Family: More than three times a week    Attends Religious Services: More than 4 times per year    Active Member of [employer] or Organizations: Yes    Attends  Archivist Meetings: More than 4 times per year    Marital Status: Widowed    Review of Systems Per HPI  Objective:  BP 130/82   Ht 5' 4"$  (1.626 m)   Wt 138 lb 9.6 oz (62.9 kg)   BMI 23.79 kg/m      04/04/2022   11:24 AM 04/04/2022   11:21 AM 03/21/2022    2:02 PM  BP/Weight  Systolic BP [id number]  Diastolic BP 82 82 80  Wt. (Lbs)  138.6 138.2  BMI  23.79 kg/m2 23.72 kg/m2    Physical Exam Vitals and nursing note reviewed.  Constitutional:      General: She is not in acute distress.    Appearance: Normal appearance.  HENT:     Head: Normocephalic and atraumatic.  Cardiovascular:     Rate and Rhythm: Normal rate and regular rhythm.  Pulmonary:     Effort: Pulmonary effort is normal. No respiratory distress.  Abdominal:     Palpations: Abdomen is soft.     Tenderness: There is no abdominal tenderness.  Neurological:     Mental Status: She is alert.  Psychiatric:        Mood and Affect: Mood normal.        Behavior: Behavior normal.     Lab Results  Component Value Date   WBC 7.5 03/21/2022   HGB 13.0 03/21/2022   HCT 38.2 03/21/2022   PLT 228 03/21/2022   GLUCOSE 142 (H) 11/19/2021   CHOL 167 11/19/2021   TRIG 71 11/19/2021   HDL 70 11/19/2021   LDLCALC 83 11/19/2021   ALT 15 11/19/2021   AST 13 11/19/2021   NA 141 11/19/2021   K 5.1 11/19/2021   CL 103 11/19/2021   CREATININE 0.60 03/10/2022   BUN 27 11/19/2021   CO2 23 11/19/2021   TSH 2.720 11/19/2021   INR 1.7 (H) 04/08/2008   HGBA1C 6.2 (H) 11/19/2021   MICROALBUR 1.0 11/22/2013     Assessment & Plan:   Problem List Items Addressed This Visit       Endocrine   Type 2 diabetes mellitus ([place]) - Primary    Based on history, it appears that the patient experienced hypoglycemia.  I suspect that this is due to Starlix. Decreasing Starlix.  Recommended discontinuation but patient is quite reluctant regarding this.  A1c today.      Relevant Medications   nateglinide (STARLIX) 60  MG tablet   Other Relevant Orders   Hemoglobin A1c     Other   Night sweats    Uncertain etiology.  Patient has had recent CT imaging of the chest as well as the abdomen pelvis.  There have been no findings suggestive of malignancy.  No  weight loss.  I will reach out to pulmonology to get input regarding recent lung findings.       Meds ordered this encounter  Medications   nateglinide (STARLIX) 60 MG tablet    Sig: Take 1 tablet (60 mg total) by mouth 3 (three) times daily with meals.    Dispense:  270 tablet    Refill:  1    Follow-up:  Return in about 3 months (around 07/03/2022).  [place]

## 2022-04-05 ENCOUNTER — Telehealth: Payer: Self-pay

## 2022-04-05 DIAGNOSIS — E119 Type 2 diabetes mellitus without complications: Secondary | ICD-10-CM

## 2022-04-05 DIAGNOSIS — E785 Hyperlipidemia, unspecified: Secondary | ICD-10-CM

## 2022-04-05 DIAGNOSIS — Z79899 Other long term (current) drug therapy: Secondary | ICD-10-CM

## 2022-04-05 NOTE — Telephone Encounter (Signed)
Blood work ordered in EPIC. Patient notified. 

## 2022-04-05 NOTE — Telephone Encounter (Signed)
Pt has appt in April with Nurse Health Advisor. Appt with PCP in May. Please advise. Thank you

## 2022-04-05 NOTE — Telephone Encounter (Signed)
Coral Spikes, DO      04/05/22  1:03 PM Please order labs: CBC, CMP, Lipid, A1C, Urine Microalbumin.

## 2022-04-05 NOTE — Telephone Encounter (Signed)
Pt is calling she said she does not have very good veins and wants all blood work done at same time she has wellness check in April so she wants all blood work ordered so she can get done all at the same time.  Call back [facility]

## 2022-04-08 DIAGNOSIS — Z79899 Other long term (current) drug therapy: Secondary | ICD-10-CM | POA: Diagnosis not present

## 2022-04-08 DIAGNOSIS — E119 Type 2 diabetes mellitus without complications: Secondary | ICD-10-CM | POA: Diagnosis not present

## 2022-04-08 DIAGNOSIS — E785 Hyperlipidemia, unspecified: Secondary | ICD-10-CM | POA: Diagnosis not present

## 2022-04-09 LAB — CBC WITH DIFFERENTIAL/PLATELET
Basophils Absolute: 0 10*3/uL (ref 0.0–0.2)
Basos: 0 %
EOS (ABSOLUTE): 0.2 10*3/uL (ref 0.0–0.4)
Eos: 3 %
Hematocrit: 39.9 % (ref 34.0–46.6)
Hemoglobin: 13.2 g/dL (ref 11.1–15.9)
Immature Grans (Abs): 0 10*3/uL (ref 0.0–0.1)
Immature Granulocytes: 0 %
Lymphocytes Absolute: 1.7 10*3/uL (ref 0.7–3.1)
Lymphs: 25 %
MCH: 28.7 pg (ref 26.6–33.0)
MCHC: 33.1 g/dL (ref 31.5–35.7)
MCV: 87 fL (ref 79–97)
Monocytes Absolute: 0.6 10*3/uL (ref 0.1–0.9)
Monocytes: 9 %
Neutrophils Absolute: 4.2 10*3/uL (ref 1.4–7.0)
Neutrophils: 63 %
Platelets: 249 10*3/uL (ref 150–450)
RBC: 4.6 x10E6/uL (ref 3.77–5.28)
RDW: 13.6 % (ref 11.7–15.4)
WBC: 6.7 10*3/uL (ref 3.4–10.8)

## 2022-04-09 LAB — CMP14+EGFR
ALT: 16 IU/L (ref 0–32)
AST: 19 IU/L (ref 0–40)
Albumin/Globulin Ratio: 2.3 — ABNORMAL HIGH (ref 1.2–2.2)
Albumin: 4.8 g/dL — ABNORMAL HIGH (ref 3.7–4.7)
Alkaline Phosphatase: 91 IU/L (ref 44–121)
BUN/Creatinine Ratio: 46 — ABNORMAL HIGH (ref 12–28)
BUN: 32 mg/dL — ABNORMAL HIGH (ref 8–27)
Bilirubin Total: 0.4 mg/dL (ref 0.0–1.2)
CO2: 24 mmol/L (ref 20–29)
Calcium: 10.4 mg/dL — ABNORMAL HIGH (ref 8.7–10.3)
Chloride: 103 mmol/L (ref 96–106)
Creatinine, Ser: 0.7 mg/dL (ref 0.57–1.00)
Globulin, Total: 2.1 g/dL (ref 1.5–4.5)
Glucose: 139 mg/dL — ABNORMAL HIGH (ref 70–99)
Potassium: 5.1 mmol/L (ref 3.5–5.2)
Sodium: 141 mmol/L (ref 134–144)
Total Protein: 6.9 g/dL (ref 6.0–8.5)
eGFR: 84 mL/min/{1.73_m2} (ref >59)

## 2022-04-09 LAB — LIPID PANEL
Chol/HDL Ratio: 2.4 ratio (ref 0.0–4.4)
Cholesterol, Total: 173 mg/dL (ref 100–199)
HDL: 71 mg/dL (ref >39)
LDL Chol Calc (NIH): 89 mg/dL (ref 0–99)
Triglycerides: 67 mg/dL (ref 0–149)
VLDL Cholesterol Cal: 13 mg/dL (ref 5–40)

## 2022-04-09 LAB — MICROALBUMIN / CREATININE URINE RATIO
Creatinine, Urine: 60.7 mg/dL
Microalb/Creat Ratio: 25 mg/g creat (ref 0–29)
Microalbumin, Urine: 14.9 ug/mL

## 2022-04-09 LAB — HEMOGLOBIN A1C
Est. average glucose Bld gHb Est-mCnc: 137 mg/dL
Hgb A1c MFr Bld: 6.4 % — ABNORMAL HIGH (ref 4.8–5.6)

## 2022-04-11 ENCOUNTER — Ambulatory Visit: Payer: 59 | Admitting: Family Medicine

## 2022-04-12 ENCOUNTER — Ambulatory Visit: Payer: 59 | Attending: Cardiology | Admitting: Cardiology

## 2022-04-12 ENCOUNTER — Encounter: Payer: Self-pay | Admitting: Cardiology

## 2022-04-12 VITALS — BP 119/62 | HR 94 | Ht 64.0 in | Wt 139.2 lb

## 2022-04-12 DIAGNOSIS — I10 Essential (primary) hypertension: Secondary | ICD-10-CM

## 2022-04-12 DIAGNOSIS — I471 Supraventricular tachycardia, unspecified: Secondary | ICD-10-CM

## 2022-04-12 DIAGNOSIS — E782 Mixed hyperlipidemia: Secondary | ICD-10-CM | POA: Diagnosis not present

## 2022-04-12 NOTE — Patient Instructions (Signed)
Medication Instructions:  Your physician recommends that you continue on your current medications as directed. Please refer to the Current Medication list given to you today.   Labwork: None today  Testing/Procedures: None today  Follow-Up: 1 year  Any Other Special Instructions Will Be Listed Below (If Applicable).  If you need a refill on your cardiac medications before your next appointment, please call your pharmacy.  

## 2022-04-12 NOTE — Progress Notes (Signed)
Cardiology Office Note  Date: 04/12/2022   ID: Deborah Gray, DOB Nov 30, 1933, MRN [id number]  PCP:  Coral Spikes, DO  Cardiologist:  Rozann Lesches, MD Electrophysiologist:  None   Chief Complaint  Patient presents with   Cardiac follow-up    History of Present Illness: Deborah Gray is an 89 y.o. female last seen in January 2023 by Ms. Bonnell Public PA-C and scheduled to follow-up with me (I last saw her in 2015).  She is here for a routine visit.  States that she has been at [facility] independent living here in [place] over the last year.  Reports no palpitations or chest discomfort.  States that she does use a stationary bicycle and remains comfortable with her ADLs.  No recent falls.  She sees Dr. Lacinda Axon for primary care.  I reviewed her medications which are noted below.  ECG today shows sinus rhythm with prolonged PR interval and decreased R wave progression.  I reviewed her most recent lab work.  Past Medical History:  Diagnosis Date   Arthritis    Benign neoplasm of colon    Constipation    Diaphragmatic hernia without mention of obstruction or gangrene    Diverticulosis of colon (without mention of hemorrhage)    Dysphagia, pharyngoesophageal phase    Esophageal reflux    Essential hypertension    Gastritis    Heart murmur    Hemorrhoids    Hyperlipidemia    Internal hemorrhoids without mention of complication    Interstitial cystitis    Left wrist fracture    PONV (postoperative nausea and vomiting)    PSVT (paroxysmal supraventricular tachycardia)    Stricture and stenosis of esophagus    Type 2 diabetes mellitus (HCC)     Current Outpatient Medications  Medication Sig Dispense Refill   acetaminophen (TYLENOL) 500 MG tablet Take 500 mg by mouth at bedtime.     ascorbic acid (VITAMIN C) 500 MG tablet Take 500 mg by mouth daily.     aspirin EC 81 MG tablet Take 81 mg by mouth at bedtime.     Azelastine-Fluticasone 137-50 MCG/ACT SUSP Place 1 spray  into the nose every 12 (twelve) hours. 23 g 0   blood glucose meter kit and supplies KIT Dispense based on  insurance preference. Tests once a day E11.9 1 each 0   Carboxymethylcellulose Sodium (THERATEARS) 0.25 % SOLN Place 1 drop into both eyes 2 (two) times daily.     cholecalciferol (VITAMIN D3) 25 MCG (1000 UNIT) tablet Take 1,000 Units by mouth at bedtime.     clidinium-chlordiazePOXIDE (LIBRAX) 5-2.5 MG capsule TAKE 1 CAPSULE BY MOUTH 3 (THREE) TIMES DAILY AS NEEDED (FOR ESOPHAGEAL SYMPTOMS). 270 capsule 0   ezetimibe (ZETIA) 10 MG tablet TAKE 1 TABLET BY MOUTH EVERY DAY 90 tablet 1   Guaifenesin 1200 MG TB12 Take 1,200 mg by mouth daily with supper.     losartan (COZAAR) 50 MG tablet Take 1 tablet (50 mg total) by mouth daily. 90 tablet 3   meclizine (ANTIVERT) 25 MG tablet Take 1 tablet (25 mg total) by mouth 3 (three) times daily as needed. 30 tablet 1   metFORMIN (GLUCOPHAGE) 500 MG tablet TAKE ONE TABLET BY MOUTH EVERY MORNING ONE AT LUNCH AND 2 AT BEDTIME 360 tablet 1   methenamine (HIPREX) 1 g tablet TAKE 1 TABLET BY MOUTH 2 TIMES DAILY. 180 tablet 3   Multiple Vitamins-Minerals (PRESERVISION AREDS 2) CAPS Take 1 capsule by mouth 2 (two) times  daily.      MYRBETRIQ 50 MG TB24 tablet TAKE 1 TABLET BY MOUTH EVERY DAY 90 tablet 3   nateglinide (STARLIX) 60 MG tablet Take 60 mg by mouth 2 (two) times daily with a meal.     omega-3 acid ethyl esters (LOVAZA) 1 g capsule Take 2 g by mouth every morning.     Pancrelipase, Lip-Prot-Amyl, (CREON) 24000-76000 units CPEP TAKE 3 CAPSULES THREE TIMES DAILY WITH MEALS AND ONE CAPSULE WITH SNACK TWICE DAILY 1000 capsule 5   RABEprazole (ACIPHEX) 20 MG tablet Take 1 tablet (20 mg total) by mouth daily. 30 tablet 11   sodium chloride (OCEAN) 0.65 % SOLN nasal spray Place 1 spray into both nostrils every morning.     No current facility-administered medications for this visit.   Allergies:  Adhesive [tape], Estrogens, and Sulfonamide derivatives    ROS: No palpitations or syncope.  Physical Exam: VS:  BP 119/62   Pulse 94   Ht '5\' 4"'$  (1.626 m)   Wt 139 lb 3.2 oz (63.1 kg)   SpO2 97%   BMI 23.89 kg/m , BMI Body mass index is 23.89 kg/m.  Wt Readings from Last 3 Encounters:  04/12/22 139 lb 3.2 oz (63.1 kg)  04/04/22 138 lb 9.6 oz (62.9 kg)  03/21/22 138 lb 3.2 oz (62.7 kg)    General: Patient appears comfortable at rest. HEENT: Conjunctiva and lids normal. Neck: Supple, no elevated JVP or carotid bruits. Lungs: Clear to auscultation, nonlabored breathing at rest. Cardiac: Regular rate and rhythm, no S3, 1/6 systolic murmur. Extremities: Venous varicosities.  ECG:  An ECG dated 09/11/2020 was personally reviewed today and demonstrated:  Sinus rhythm with prolonged PR interval and PVC.  Recent Labwork: 11/19/2021: TSH 2.720 04/08/2022: ALT 16; AST 19; BUN 32; Creatinine, Ser 0.70; Hemoglobin 13.2; Platelets 249; Potassium 5.1; Sodium 141     Component Value Date/Time   CHOL 173 04/08/2022 1024   TRIG 67 04/08/2022 1024   HDL 71 04/08/2022 1024   CHOLHDL 2.4 04/08/2022 1024   CHOLHDL 3.6 11/22/2013 0852   VLDL 29 11/22/2013 0852   LDLCALC 89 04/08/2022 1024    Other Studies Reviewed Today:  Echocardiogram 03/08/2020:  1. Left ventricular ejection fraction, by estimation, is 60 to 65%. The  left ventricle has normal function. The left ventricle has no regional  wall motion abnormalities. There is mild left ventricular hypertrophy.  Left ventricular diastolic parameters  are consistent with Grade I diastolic dysfunction (impaired relaxation).  Elevated left atrial pressure. The average left ventricular global  longitudinal strain is -17.6 %. The global longitudinal strain is normal.   2. Right ventricular systolic function is normal. The right ventricular  size is normal.   3. Left atrial size was moderately dilated.   4. The mitral valve is normal in structure. Trivial mitral valve  regurgitation. No evidence of  mitral stenosis.   5. The aortic valve is tricuspid. There is mild calcification of the  aortic valve. There is mild thickening of the aortic valve. Aortic valve  regurgitation is not visualized. No aortic stenosis is present.   6. The inferior vena cava is normal in size with greater than 50%  respiratory variability, suggesting right atrial pressure of 3 mmHg.   Assessment and Plan:  1.  PSVT by history, no active palpitations reported and currently not on any AV nodal blockers.  ECG shows sinus rhythm today.  Will continue with observation for now.  2.  Mixed hyperlipidemia, currently on Lovaza  and Zetia with follow-up by Dr. Lacinda Axon.  Recent triglycerides 67 and HDL 89.  I did not make any changes.  3.  Essential hypertension, blood pressure is normal today.  She remains on losartan.  Medication Adjustments/Labs and Tests Ordered: Current medicines are reviewed at length with the patient today.  Concerns regarding medicines are outlined above.   Tests Ordered: Orders Placed This Encounter  Procedures   EKG 12-Lead    Medication Changes: No orders of the defined types were placed in this encounter.   Disposition:  Follow up  1 year.  Signed, Satira Sark, MD, [facility] 04/12/2022 2:21 PM    [place] at [facility] [address] [place], [place] [address] Phone: [phone]; Fax: [phone]

## 2022-04-14 ENCOUNTER — Other Ambulatory Visit: Payer: Self-pay | Admitting: Family Medicine

## 2022-04-17 ENCOUNTER — Telehealth: Payer: Self-pay | Admitting: Family Medicine

## 2022-04-17 ENCOUNTER — Other Ambulatory Visit: Payer: Self-pay

## 2022-04-17 MED ORDER — METFORMIN HCL 500 MG PO TABS: ORAL_TABLET | ORAL | 1 refills | Status: DC

## 2022-04-17 NOTE — Telephone Encounter (Signed)
Refill on metFORMIN (GLUCOPHAGE) 500 MG tablet  with 90 day supply sent to [facility] [place]

## 2022-04-24 ENCOUNTER — Ambulatory Visit: Payer: 59

## 2022-05-01 ENCOUNTER — Ambulatory Visit (INDEPENDENT_AMBULATORY_CARE_PROVIDER_SITE_OTHER): Payer: Medicare Other | Admitting: Urology

## 2022-05-01 DIAGNOSIS — N3010 Interstitial cystitis (chronic) without hematuria: Secondary | ICD-10-CM | POA: Diagnosis not present

## 2022-05-01 MED ORDER — LIDOCAINE HCL 1 % IJ SOLN
10.0000 mL | Freq: Once | INTRAMUSCULAR | Status: AC
  Administered 2022-05-01: 10 mL

## 2022-05-01 MED ORDER — LIDOCAINE HCL 2 % IJ SOLN: 10.0000 mL | Freq: Once | INTRAMUSCULAR | Status: DC

## 2022-05-01 MED ORDER — HYDROCORTISONE SOD SUC (PF) 100 MG IJ SOLR
100.0000 mg | Freq: Once | INTRAMUSCULAR | Status: AC
  Administered 2022-05-01: 100 mg

## 2022-05-01 MED ORDER — DIMETHYL SULFOXIDE 50 % IS SOLN
50.0000 mL | Freq: Once | INTRAVESICAL | Status: AC
  Administered 2022-05-01: 50 mL via URETHRAL

## 2022-05-01 MED ORDER — SODIUM BICARBONATE 8.4 % IV SOLN
50.0000 meq | Freq: Once | INTRAVENOUS | Status: AC
  Administered 2022-05-01: 50 meq

## 2022-05-01 NOTE — Progress Notes (Signed)
Bladder Instillation DMSO  Due to Interstitial Cystitis patient is present today for a Bladder Instillation of DMSO treatment. Patient was cleaned and prepped in a sterile fashion with betadine.  A 20 FR catheter was inserted, urine return was noted 20 ml, urine was yellow in color.  DMSO treatment was instilled into the bladder. The catheter was then removed. Patient tolerated well, no complications were noted pt was instructed to hold the instillation for 20 minutes and then will void it out. Pt voiced understanding.    Preformed by: Marisue Brooklyn, CMA  Follow up/ Additional notes: Keep [place]

## 2022-05-02 LAB — URINALYSIS, ROUTINE W REFLEX MICROSCOPIC
Bilirubin, UA: NEGATIVE
Glucose, UA: NEGATIVE
Ketones, UA: NEGATIVE
Nitrite, UA: NEGATIVE
Protein,UA: NEGATIVE
Specific Gravity, UA: 1.02 (ref 1.005–1.030)
Urobilinogen, Ur: 0.2 mg/dL (ref 0.2–1.0)
pH, UA: 6.5 (ref 5.0–7.5)

## 2022-05-02 LAB — MICROSCOPIC EXAMINATION: Bacteria, UA: NONE SEEN

## 2022-05-07 DIAGNOSIS — H353132 Nonexudative age-related macular degeneration, bilateral, intermediate dry stage: Secondary | ICD-10-CM | POA: Diagnosis not present

## 2022-05-07 DIAGNOSIS — Z961 Presence of intraocular lens: Secondary | ICD-10-CM | POA: Diagnosis not present

## 2022-05-07 DIAGNOSIS — H34832 Tributary (branch) retinal vein occlusion, left eye, with macular edema: Secondary | ICD-10-CM | POA: Diagnosis not present

## 2022-05-07 DIAGNOSIS — E119 Type 2 diabetes mellitus without complications: Secondary | ICD-10-CM | POA: Diagnosis not present

## 2022-05-14 DIAGNOSIS — E1142 Type 2 diabetes mellitus with diabetic polyneuropathy: Secondary | ICD-10-CM | POA: Diagnosis not present

## 2022-05-14 DIAGNOSIS — B351 Tinea unguium: Secondary | ICD-10-CM | POA: Diagnosis not present

## 2022-05-23 ENCOUNTER — Ambulatory Visit (INDEPENDENT_AMBULATORY_CARE_PROVIDER_SITE_OTHER): Payer: Medicare Other | Admitting: Urology

## 2022-05-23 DIAGNOSIS — N3010 Interstitial cystitis (chronic) without hematuria: Secondary | ICD-10-CM | POA: Diagnosis not present

## 2022-05-23 MED ORDER — LIDOCAINE HCL 2 % IJ SOLN
10.0000 mL | Freq: Once | INTRAMUSCULAR | Status: AC
Start: 2022-05-23 — End: 2022-05-23
  Administered 2022-05-23: 200 mg

## 2022-05-23 MED ORDER — HYDROCORTISONE SOD SUC (PF) 100 MG IJ SOLR
100.0000 mg | Freq: Once | INTRAMUSCULAR | Status: AC
Start: 2022-05-23 — End: 2022-05-23
  Administered 2022-05-23: 100 mg

## 2022-05-23 MED ORDER — DIMETHYL SULFOXIDE 50 % IS SOLN
50.0000 mL | Freq: Once | INTRAVESICAL | Status: AC
  Administered 2022-05-23: 50 mL via URETHRAL

## 2022-05-23 MED ORDER — SODIUM BICARBONATE 8.4 % IV SOLN
50.0000 meq | Freq: Once | INTRAVENOUS | Status: AC
  Administered 2022-05-23: 50 meq

## 2022-05-23 NOTE — Progress Notes (Cosign Needed Addendum)
Bladder Instillation DMSO  Due to Interstitial Cystitis patient is present today for a Bladder Instillation of DMSO treatment. Patient was cleaned and prepped in a sterile fashion with betadine.  A 20FR catheter was inserted, urine return was noted 240ml, urine was yellow in color.  DMSO treatment was instilled into the bladder. The catheter was then removed. Patient tolerated well, no complications were noted pt was instructed to hold the instillation for 20 minutes and then will void it out. Pt voiced understanding.    Preformed by: Marisue Brooklyn, CMA  Follow up/ Additional notes: Keep scheduled nurse visit

## 2022-05-24 LAB — URINALYSIS, ROUTINE W REFLEX MICROSCOPIC
Bilirubin, UA: NEGATIVE
Glucose, UA: NEGATIVE
Ketones, UA: NEGATIVE
Nitrite, UA: NEGATIVE
Protein,UA: NEGATIVE
RBC, UA: NEGATIVE
Specific Gravity, UA: 1.015 (ref 1.005–1.030)
Urobilinogen, Ur: 0.2 mg/dL (ref 0.2–1.0)
pH, UA: 7 (ref 5.0–7.5)

## 2022-05-25 ENCOUNTER — Other Ambulatory Visit: Payer: Self-pay | Admitting: Gastroenterology

## 2022-05-25 DIAGNOSIS — K861 Other chronic pancreatitis: Secondary | ICD-10-CM

## 2022-05-30 ENCOUNTER — Ambulatory Visit: Payer: 59

## 2022-05-31 ENCOUNTER — Ambulatory Visit (INDEPENDENT_AMBULATORY_CARE_PROVIDER_SITE_OTHER): Payer: Medicare Other

## 2022-05-31 VITALS — Ht 64.0 in | Wt 139.0 lb

## 2022-05-31 DIAGNOSIS — Z0000 Encounter for general adult medical examination without abnormal findings: Secondary | ICD-10-CM

## 2022-05-31 NOTE — Progress Notes (Signed)
Subjective:   Deborah Gray is a 89 y.o. female who presents for Medicare Annual (Subsequent) preventive examination.  I connected with  [place] on 05/31/22 by a audio enabled telemedicine application and verified that I am speaking with the correct person using two identifiers.  Patient Location: Home  Provider Location: Office/Clinic  I discussed the limitations of evaluation and management by telemedicine. The patient expressed understanding and agreed to proceed.  Review of Systems     Cardiac Risk Factors include: advanced age (>1men, >65 women);diabetes mellitus;dyslipidemia;hypertension     Objective:    Today's Vitals   05/31/22 1455  Weight: 139 lb (63 kg)  Height: 5\' 4"  (1.626 m)   Body mass index is 23.86 kg/m.     05/31/2022    2:58 PM 01/02/2022    9:39 AM 01/01/2022    9:20 AM 05/15/2021    3:37 PM 03/29/2021    9:01 AM 03/27/2021    8:18 AM 03/16/2021    3:22 AM  Advanced Directives  Does Patient Have a Medical Advance Directive? Yes No No Yes Yes Yes Yes  Type of Advance Directive Living will;Healthcare Power of [employer] Power of [place];Living will  Living will Living will  Does patient want to make changes to medical advance directive? No - Patient declined     No - Patient declined No - Patient declined  Copy of Healthcare Power of Attorney in Chart? No - copy requested   No - copy requested     Would patient like information on creating a medical advance directive?   No - Patient declined   No - Patient declined No - Patient declined    Current Medications (verified) Outpatient Encounter Medications as of 05/31/2022  Medication Sig   acetaminophen (TYLENOL) 500 MG tablet Take 500 mg by mouth at bedtime.   ascorbic acid (VITAMIN C) 500 MG tablet Take 500 mg by mouth daily.   aspirin EC 81 MG tablet Take 81 mg by mouth at bedtime.   Azelastine-Fluticasone 137-50 MCG/ACT SUSP Place 1 spray into the nose every 12 (twelve)  hours.   blood glucose meter kit and supplies KIT Dispense based on  insurance preference. Tests once a day E11.9   Carboxymethylcellulose Sodium (THERATEARS) 0.25 % SOLN Place 1 drop into both eyes 2 (two) times daily.   cholecalciferol (VITAMIN D3) 25 MCG (1000 UNIT) tablet Take 1,000 Units by mouth at bedtime.   clidinium-chlordiazePOXIDE (LIBRAX) 5-2.5 MG capsule TAKE 1 CAPSULE BY MOUTH 3 (THREE) TIMES DAILY AS NEEDED (FOR ESOPHAGEAL SYMPTOMS).   ezetimibe (ZETIA) 10 MG tablet TAKE 1 TABLET BY MOUTH EVERY DAY   Guaifenesin 1200 MG TB12 Take 1,200 mg by mouth daily with supper.   losartan (COZAAR) 50 MG tablet Take 1 tablet (50 mg total) by mouth daily.   meclizine (ANTIVERT) 25 MG tablet Take 1 tablet (25 mg total) by mouth 3 (three) times daily as needed.   metFORMIN (GLUCOPHAGE) 500 MG tablet TAKE ONE TABLET BY MOUTH EVERY MORNING ONE AT LUNCH AND 2 AT BEDTIME   methenamine (HIPREX) 1 g tablet TAKE 1 TABLET BY MOUTH 2 TIMES DAILY.   Multiple Vitamins-Minerals (PRESERVISION AREDS 2) CAPS Take 1 capsule by mouth 2 (two) times daily.    MYRBETRIQ 50 MG TB24 tablet TAKE 1 TABLET BY MOUTH EVERY DAY   nateglinide (STARLIX) 60 MG tablet Take 60 mg by mouth 2 (two) times daily with a meal.   omega-3 acid ethyl esters (LOVAZA)  1 g capsule Take 2 g by mouth every morning.   Pancrelipase, Lip-Prot-Amyl, (CREON) 24000-76000 units CPEP TAKE 3 CAPSULES THREE TIMES DAILY WITH MEALS AND ONE CAPSULE WITH SNACK TWICE DAILY   RABEprazole (ACIPHEX) 20 MG tablet Take 1 tablet (20 mg total) by mouth daily.   sodium chloride (OCEAN) 0.65 % SOLN nasal spray Place 1 spray into both nostrils every morning.   No facility-administered encounter medications on file as of 05/31/2022.    Allergies (verified) Adhesive [tape], Estrogens, and Sulfonamide derivatives   History: Past Medical History:  Diagnosis Date   Arthritis    Benign neoplasm of colon    Constipation    Diaphragmatic hernia without mention of  obstruction or gangrene    Diverticulosis of colon (without mention of hemorrhage)    Dysphagia, pharyngoesophageal phase    Esophageal reflux    Essential hypertension    Gastritis    Heart murmur    Hemorrhoids    Hyperlipidemia    Internal hemorrhoids without mention of complication    Interstitial cystitis    Left wrist fracture    PONV (postoperative nausea and vomiting)    PSVT (paroxysmal supraventricular tachycardia)    Stricture and stenosis of esophagus    Type 2 diabetes mellitus    Past Surgical History:  Procedure Laterality Date   ABDOMINAL HYSTERECTOMY     BACTERIAL OVERGROWTH TEST N/A 09/06/2015   Procedure: BACTERIAL OVERGROWTH TEST;  Surgeon: Corbin Ade, MD;  [facility];  Service: Endoscopy;  Laterality: N/A;  800   BIOPSY  01/02/2022   Procedure: BIOPSY;  Surgeon: Corbin Ade, MD;  [facility];  Service: Endoscopy;;   BREAST LUMPECTOMY Right    benign   CATARACT EXTRACTION Bilateral 2006   Implants in both, surgeries done 6 weeks apart   CHOLECYSTECTOMY     COLONOSCOPY  03/08/02   Jarold Motto: diverticulosis, internal hemorrhoids, adenomatous colon polyp   COLONOSCOPY  01/23/04   Jarold Motto: diverticulosis, internal hemorrhoids   COLONOSCOPY  09/25/05   Jarold Motto: diverticulosis   COLONOSCOPY  07/05/09   Jarold Motto: severe diverticulosis   COLONOSCOPY  01/21/11   Jarold Motto: severe diverticulosis in sigmoid to desc colon, int hemorrhoids, follow up TCS in 5 years   COLONOSCOPY N/A 04/10/2016   [name]: diverticulosis   CYSTOSCOPY W/ URETERAL STENT PLACEMENT Right 03/15/2021   Procedure: CYSTOSCOPY WITH RETROGRADE PYELOGRAM/URETERAL STENT PLACEMENT;  Surgeon: Jannifer Hick, MD;  Location: [facility];  Service: Urology;  Laterality: Right;   CYSTOSCOPY WITH RETROGRADE PYELOGRAM, URETEROSCOPY AND STENT PLACEMENT Right 03/29/2021   Procedure: CYSTOSCOPY WITH RETROGRADE PYELOGRAM, URETEROSCOPY AND STENT EXCHANGE;  Surgeon: Malen Gauze, MD;   Location: [facility];  Service: Urology;  Laterality: Right;   DILATION AND CURETTAGE OF UTERUS     ESOPHAGEAL MANOMETRY  03/21/08   [place]: findings c/w [place] Esophagus   ESOPHAGOGASTRODUODENOSCOPY  03/08/02   Jarold Motto: esophageal stricture, chronic gerd, s/p dilation.    ESOPHAGOGASTRODUODENOSCOPY  01/23/04   Jarold Motto: esophageal stricture, gastritis, hiatal hernia   ESOPHAGOGASTRODUODENOSCOPY  09/25/05   Jarold Motto: gastritis, benign bx, no H.pylori   ESOPHAGOGASTRODUODENOSCOPY  07/05/09   Jarold Motto: gastropathy, benign small bowel bx and gastric bx   ESOPHAGOGASTRODUODENOSCOPY N/A 05/15/2015   Dr. Jena Gauss- diffuse moderate inflammation characterized by congestion (edema), erythema, and linear erosions was found in the entire examined stomach. bx= reactive gastropathy   ESOPHAGOGASTRODUODENOSCOPY (EGD) WITH PROPOFOL N/A 01/02/2022   Procedure: ESOPHAGOGASTRODUODENOSCOPY (EGD) WITH PROPOFOL;  Surgeon: Corbin Ade, MD;  [facility];  Service: Endoscopy;  Laterality: N/A;  10:30am, asa 2   FOOT SURGERY Left    HEMORRHOID BANDING  2017   Dr.[name]   HOLMIUM LASER APPLICATION Right 03/29/2021   Procedure: HOLMIUM LASER APPLICATION;  Surgeon: Malen Gauze, MD;  Location: [facility];  Service: Urology;  Laterality: Right;   LAPAROSCOPIC VAGINAL HYSTERECTOMY     MALONEY DILATION N/A 01/02/2022   Procedure: Elease Hashimoto DILATION;  Surgeon: Corbin Ade, MD;  [facility];  Service: Endoscopy;  Laterality: N/A;   ORIF WRIST FRACTURE Left 06/23/2018   Procedure: OPEN REDUCTION INTERNAL FIXATION (ORIF) LEFT WRIST FRACTURE;  Surgeon: Sheral Apley, MD;  Location: [facility];  Service: Orthopedics;  Laterality: Left;  regional arm block   RECTOCELE REPAIR     TOTAL KNEE ARTHROPLASTY Right    Family History  Problem Relation Age of Onset   Ovarian cancer Mother    Diabetes Father    Heart disease Father    Breast cancer Sister    Liver cancer Sister     Colon cancer Cousin        Paternal side   Diabetes Maternal Aunt    Liver disease Cousin        Maternal side, never drank   Social History   Socioeconomic History   Marital status: Widowed    Spouse name: Not on file   Number of children: 3   Years of education: Not on file   Highest education level: Not on file  Occupational History   Occupation: Retired  Tobacco Use   Smoking status: Never   Smokeless tobacco: Never   Tobacco comments:    Never smoked  Vaping Use   Vaping Use: Never used  Substance and Sexual Activity   Alcohol use: No    Alcohol/week: 0.0 standard drinks of alcohol   Drug use: No   Sexual activity: Not Currently  Other Topics Concern   Not on file  Social History Narrative   Widowed since 07/2018. Married x 61 years.   3 sons. 5, 1963 and 1966.   Social Determinants of Health   Financial Resource Strain: Low Risk  (05/31/2022)   Overall Financial Resource Strain (CARDIA)    Difficulty of Paying Living Expenses: Not hard at all  Food Insecurity: No Food Insecurity (05/31/2022)   Hunger Vital Sign    Worried About Running Out of Food in the Last Year: Never true    Ran Out of Food in the Last Year: Never true  Transportation Needs: No Transportation Needs (05/31/2022)   PRAPARE - Administrator, Civil Service (Medical): No    Lack of Transportation (Non-Medical): No  Physical Activity: Sufficiently Active (05/31/2022)   Exercise Vital Sign    Days of Exercise per Week: 5 days    Minutes of Exercise per Session: 30 min  Stress: No Stress Concern Present (05/31/2022)   [employer] of Occupational Health - Occupational Stress Questionnaire    Feeling of Stress : Not at all  Social Connections: Moderately Integrated (05/31/2022)   Social Connection and Isolation Panel [NHANES]    Frequency of Communication with Friends and Family: More than three times a week    Frequency of Social Gatherings with Friends and Family: More than  three times a week    Attends Religious Services: More than 4 times per year    Active Member of [employer] or Organizations: Yes    Attends Banker  Meetings: More than 4 times per year    Marital Status: Widowed    Tobacco Counseling Counseling given: Not Answered Tobacco comments: Never smoked   Clinical Intake:  Pre-visit preparation completed: Yes  Pain : No/denies pain  Diabetes: Yes CBG done?: No Did pt. bring in CBG monitor from home?: No  How often do you need to have someone help you when you read instructions, pamphlets, or other written materials from your doctor or pharmacy?: 1 - Never  Diabetic?Yes   Nutrition Risk Assessment:  Has the patient had any N/V/D within the last 2 months?  No  Does the patient have any non-healing wounds?  No  Has the patient had any unintentional weight loss or weight gain?  No   Diabetes:  Is the patient diabetic?  Yes  If diabetic, was a CBG obtained today?  No  Did the patient bring in their glucometer from home?  No  How often do you monitor your CBG's? daily.   Financial Strains and Diabetes Management:  Are you having any financial strains with the device, your supplies or your medication? No .  Does the patient want to be seen by Chronic Care Management for management of their diabetes?  No  Would the patient like to be referred to a Nutritionist or for Diabetic Management?  No   Diabetic Exams:  Diabetic Eye Exam: Completed records requested Diabetic Foot Exam: Overdue, Pt has been advised about the importance in completing this exam. Pt is scheduled for diabetic foot exam on next office visit.   Interpreter Needed?: No  Information entered by :: Kandis Fantasia LPN   Activities of Daily Living    05/31/2022    2:58 PM 01/01/2022    9:21 AM  In your present state of health, do you have any difficulty performing the following activities:  Hearing? 0   Vision? 0   Difficulty concentrating or making  decisions? 0   Walking or climbing stairs? 0   Dressing or bathing? 0   Doing errands, shopping? 0 0  Preparing Food and eating ? N   Using the Toilet? N   In the past six months, have you accidently leaked urine? N   Do you have problems with loss of bowel control? N   Managing your Medications? N   Managing your Finances? N   Housekeeping or managing your Housekeeping? N     Patient Care Team: Tommie Sams, DO as PCP - General (Family Medicine) Jonelle Sidle, MD as PCP - Cardiology (Cardiology) Jena Gauss Gerrit Friends, MD as Consulting Physician (Gastroenterology) Nyoka Cowden, MD as Consulting Physician (Pulmonary Disease) Pa, [facility])  Indicate any recent Medical Services you may have received from other than [facility] providers in the past year (date may be approximate).     Assessment:   This is a routine wellness examination for [place].  Hearing/Vision screen Hearing Screening - Comments:: Denies hearing difficulties   Vision Screening - Comments:: Wears rx glasses - up to date with routine eye exams with [facility]    Dietary issues and exercise activities discussed: Current Exercise Habits: Home exercise routine, Type of exercise: walking, Time (Minutes): 30, Frequency (Times/Week): 3, Weekly Exercise (Minutes/Week): 90, Intensity: Mild   Goals Addressed   None   Depression Screen    05/31/2022    2:56 PM 03/07/2022   11:17 AM 02/21/2022   11:20 AM 05/15/2021    3:30 PM 12/07/2020   10:09 AM 02/04/2020  3:05 PM 07/05/2019   10:33 AM  PHQ 2/9 Scores  PHQ - 2 Score 0 0 0 0 0 0 0  PHQ- 9 Score  0         Fall Risk    05/31/2022    2:58 PM 03/07/2022   11:17 AM 02/21/2022   11:20 AM 05/15/2021    3:38 PM 12/07/2020   10:09 AM  Fall Risk   Falls in the past year? 0 0 0 0 0  Number falls in past yr: 0 0 0 0 0  Injury with Fall? 0 0 0 0 0  Risk for fall due to : No Fall Risks No Fall Risks Impaired balance/gait Impaired  vision;Impaired balance/gait No Fall Risks  Follow up Falls prevention discussed;Education provided;Falls evaluation completed Falls evaluation completed Falls evaluation completed;Education provided Falls prevention discussed Falls evaluation completed    FALL RISK PREVENTION PERTAINING TO THE HOME:  Any stairs in or around the home? No  If so, are there any without handrails? No  Home free of loose throw rugs in walkways, pet beds, electrical cords, etc? Yes  Adequate lighting in your home to reduce risk of falls? Yes   ASSISTIVE DEVICES UTILIZED TO PREVENT FALLS:  Life alert? No  Use of a cane, walker or w/c? No  Grab bars in the bathroom? Yes  Shower chair or bench in shower? No  Elevated toilet seat or a handicapped toilet? Yes   TIMED UP AND GO:  Was the test performed? No . Telephonic visit   Cognitive Function:        05/31/2022    2:58 PM 12/07/2020   10:14 AM  6CIT Screen  What Year? 0 points 0 points  What month? 0 points 0 points  What time? 0 points 0 points  Count back from 20 0 points 0 points  Months in reverse 0 points 0 points  Repeat phrase 0 points 0 points  Total Score 0 points 0 points    Immunizations Immunization History  Administered Date(s) Administered   Fluad Quad(high Dose 65+) 12/07/2020, 11/27/2021   Influenza, High Dose Seasonal PF 12/10/2019   Influenza,inj,Quad PF,6+ Mos 11/22/2013, 11/30/2014, 12/01/2015, 11/11/2016, 01/08/2018, 12/12/2018   Influenza-Unspecified 12/18/2010, 11/25/2011, 11/23/2012   Moderna Sars-Covid-2 Vaccination 04/01/2019, 04/30/2019   Pneumococcal Conjugate-13 11/22/2013   Pneumococcal Polysaccharide-23 12/08/2003   Td 08/01/1993, 07/21/2000   Zoster Recombinat (Shingrix) 07/07/2019, 08/10/2019    TDAP status: Due, Education has been provided regarding the importance of this vaccine. Advised may receive this vaccine at local pharmacy or Health Dept. Aware to provide a copy of the vaccination record if  obtained from local pharmacy or Health Dept. Verbalized acceptance and understanding.  Flu Vaccine status: Up to date  Pneumococcal vaccine status: Up to date  Covid-19 vaccine status: Information provided on how to obtain vaccines.   Qualifies for Shingles Vaccine? Yes   Zostavax completed No   Shingrix Completed?: Yes  Screening Tests Health Maintenance  Topic Date Due   DTaP/Tdap/Td (3 - Tdap) 07/22/2010   FOOT EXAM  02/03/2021   OPHTHALMOLOGY EXAM  12/04/2021   INFLUENZA VACCINE  09/19/2022   HEMOGLOBIN A1C  10/07/2022   Medicare Annual Wellness (AWV)  05/31/2023   Pneumonia Vaccine 71+ Years old  Completed   DEXA SCAN  Completed   Zoster Vaccines- Shingrix  Completed   HPV VACCINES  Aged Out   COVID-19 Vaccine  Discontinued    Health Maintenance  Health Maintenance Due  Topic Date Due  DTaP/Tdap/Td (3 - Tdap) 07/22/2010   FOOT EXAM  02/03/2021   OPHTHALMOLOGY EXAM  12/04/2021    Colorectal cancer screening: No longer required.   Mammogram status: No longer required due to age.  Bone Density status: Completed 01/08/2016. Results reflect: Bone density results: OSTEOPENIA. Repeat every 2 years.  Lung Cancer Screening: (Low Dose CT Chest recommended if Age 74-80 years, 30 pack-year currently smoking OR have quit w/in 15years.) does not qualify.   Lung Cancer Screening Referral: n/a  Additional Screening:  Hepatitis C Screening: does not qualify;   Vision Screening: Recommended annual ophthalmology exams for early detection of glaucoma and other disorders of the eye. Is the patient up to date with their annual eye exam?  Yes  Who is the provider or what is the name of the office in which the patient attends annual eye exams? [facility] If pt is not established with a provider, would they like to be referred to a provider to establish care? No .   Dental Screening: Recommended annual dental exams for proper oral hygiene  Community Resource Referral /  Chronic Care Management: CRR required this visit?  No   CCM required this visit?  No      Plan:     I have personally reviewed and noted the following in the patient's chart:   Medical and social history Use of alcohol, tobacco or illicit drugs  Current medications and supplements including opioid prescriptions. Patient is not currently taking opioid prescriptions. Functional ability and status Nutritional status Physical activity Advanced directives List of other physicians Hospitalizations, surgeries, and ER visits in previous 12 months Vitals Screenings to include cognitive, depression, and falls Referrals and appointments  In addition, I have reviewed and discussed with patient certain preventive protocols, quality metrics, and best practice recommendations. A written personalized care plan for preventive services as well as general preventive health recommendations were provided to patient.     Durwin Nora, [place]   1/61/0960   Due to this being a virtual visit, the after visit summary with patients personalized plan was offered to patient via mail or my-chart.  per request, patient was mailed a copy of AVS  Nurse Notes: No concerns

## 2022-05-31 NOTE — Patient Instructions (Signed)
Ms. Deborah Gray , Thank you for taking time to come for your Medicare Wellness Visit. I appreciate your ongoing commitment to your health goals. Please review the following plan we discussed and let me know if I can assist you in the future.   These are the goals we discussed:  Goals      Exercise 3x per week (30 min per time)     Continue to exercise and stay healthy.        This is a list of the screening recommended for you and due dates:  Health Maintenance  Topic Date Due   DTaP/Tdap/Td vaccine (3 - Tdap) 07/22/2010   Complete foot exam   02/03/2021   Eye exam for diabetics  12/04/2021   Flu Shot  09/19/2022   Hemoglobin A1C  10/07/2022   Medicare Annual Wellness Visit  05/31/2023   Pneumonia Vaccine  Completed   DEXA scan (bone density measurement)  Completed   Zoster (Shingles) Vaccine  Completed   HPV Vaccine  Aged Out   COVID-19 Vaccine  Discontinued    Advanced directives: Please bring a copy of your health care power of attorney and living will to the office to be added to your chart at your convenience.   Conditions/risks identified: Aim for 30 minutes of exercise or brisk walking, 6-8 glasses of water, and 5 servings of fruits and vegetables each day.   Next appointment: Follow up in one year for your annual wellness visit    Preventive Care 65 Years and Older, Female Preventive care refers to lifestyle choices and visits with your health care provider that can promote health and wellness. What does preventive care include? A yearly physical exam. This is also called an annual well check. Dental exams once or twice a year. Routine eye exams. Ask your health care provider how often you should have your eyes checked. Personal lifestyle choices, including: Daily care of your teeth and gums. Regular physical activity. Eating a healthy diet. Avoiding tobacco and drug use. Limiting alcohol use. Practicing safe sex. Taking low-dose aspirin every day. Taking vitamin  and mineral supplements as recommended by your health care provider. What happens during an annual well check? The services and screenings done by your health care provider during your annual well check will depend on your age, overall health, lifestyle risk factors, and family history of disease. Counseling  Your health care provider may ask you questions about your: Alcohol use. Tobacco use. Drug use. Emotional well-being. Home and relationship well-being. Sexual activity. Eating habits. History of falls. Memory and ability to understand (cognition). Work and work Astronomer. Reproductive health. Screening  You may have the following tests or measurements: Height, weight, and BMI. Blood pressure. Lipid and cholesterol levels. These may be checked every 5 years, or more frequently if you are over 64 years old. Skin check. Lung cancer screening. You may have this screening every year starting at age 76 if you have a 30-pack-year history of smoking and currently smoke or have quit within the past 15 years. Fecal occult blood test (FOBT) of the stool. You may have this test every year starting at age 58. Flexible sigmoidoscopy or colonoscopy. You may have a sigmoidoscopy every 5 years or a colonoscopy every 10 years starting at age 79. Hepatitis C blood test. Hepatitis B blood test. Sexually transmitted disease (STD) testing. Diabetes screening. This is done by checking your blood sugar (glucose) after you have not eaten for a while (fasting). You may have this done  every 1-3 years. Bone density scan. This is done to screen for osteoporosis. You may have this done starting at age 22. Mammogram. This may be done every 1-2 years. Talk to your health care provider about how often you should have regular mammograms. Talk with your health care provider about your test results, treatment options, and if necessary, the need for more tests. Vaccines  Your health care provider may recommend  certain vaccines, such as: Influenza vaccine. This is recommended every year. Tetanus, diphtheria, and acellular pertussis (Tdap, Td) vaccine. You may need a Td booster every 10 years. Zoster vaccine. You may need this after age 16. Pneumococcal 13-valent conjugate (PCV13) vaccine. One dose is recommended after age 57. Pneumococcal polysaccharide (PPSV23) vaccine. One dose is recommended after age 13. Talk to your health care provider about which screenings and vaccines you need and how often you need them. This information is not intended to replace advice given to you by your health care provider. Make sure you discuss any questions you have with your health care provider. Document Released: 03/03/2015 Document Revised: 10/25/2015 Document Reviewed: 12/06/2014 Elsevier Interactive Patient Education  2017 [employer] Prevention in the Home Falls can cause injuries. They can happen to people of all ages. There are many things you can do to make your home safe and to help prevent falls. What can I do on the outside of my home? Regularly fix the edges of walkways and driveways and fix any cracks. Remove anything that might make you trip as you walk through a door, such as a raised step or threshold. Trim any bushes or trees on the path to your home. Use bright outdoor lighting. Clear any walking paths of anything that might make someone trip, such as rocks or tools. Regularly check to see if handrails are loose or broken. Make sure that both sides of any steps have handrails. Any raised decks and porches should have guardrails on the edges. Have any leaves, snow, or ice cleared regularly. Use sand or salt on walking paths during winter. Clean up any spills in your garage right away. This includes oil or grease spills. What can I do in the bathroom? Use night lights. Install grab bars by the toilet and in the tub and shower. Do not use towel bars as grab bars. Use non-skid mats or  decals in the tub or shower. If you need to sit down in the shower, use a plastic, non-slip stool. Keep the floor dry. Clean up any water that spills on the floor as soon as it happens. Remove soap buildup in the tub or shower regularly. Attach bath mats securely with double-sided non-slip rug tape. Do not have throw rugs and other things on the floor that can make you trip. What can I do in the bedroom? Use night lights. Make sure that you have a light by your bed that is easy to reach. Do not use any sheets or blankets that are too big for your bed. They should not hang down onto the floor. Have a firm chair that has side arms. You can use this for support while you get dressed. Do not have throw rugs and other things on the floor that can make you trip. What can I do in the kitchen? Clean up any spills right away. Avoid walking on wet floors. Keep items that you use a lot in easy-to-reach places. If you need to reach something above you, use a strong step stool that has  a grab bar. Keep electrical cords out of the way. Do not use floor polish or wax that makes floors slippery. If you must use wax, use non-skid floor wax. Do not have throw rugs and other things on the floor that can make you trip. What can I do with my stairs? Do not leave any items on the stairs. Make sure that there are handrails on both sides of the stairs and use them. Fix handrails that are broken or loose. Make sure that handrails are as long as the stairways. Check any carpeting to make sure that it is firmly attached to the stairs. Fix any carpet that is loose or worn. Avoid having throw rugs at the top or bottom of the stairs. If you do have throw rugs, attach them to the floor with carpet tape. Make sure that you have a light switch at the top of the stairs and the bottom of the stairs. If you do not have them, ask someone to add them for you. What else can I do to help prevent falls? Wear shoes that: Do not  have high heels. Have rubber bottoms. Are comfortable and fit you well. Are closed at the toe. Do not wear sandals. If you use a stepladder: Make sure that it is fully opened. Do not climb a closed stepladder. Make sure that both sides of the stepladder are locked into place. Ask someone to hold it for you, if possible. Clearly mark and make sure that you can see: Any grab bars or handrails. First and last steps. Where the edge of each step is. Use tools that help you move around (mobility aids) if they are needed. These include: Canes. Walkers. Scooters. Crutches. Turn on the lights when you go into a dark area. Replace any light bulbs as soon as they burn out. Set up your furniture so you have a clear path. Avoid moving your furniture around. If any of your floors are uneven, fix them. If there are any pets around you, be aware of where they are. Review your medicines with your doctor. Some medicines can make you feel dizzy. This can increase your chance of falling. Ask your doctor what other things that you can do to help prevent falls. This information is not intended to replace advice given to you by your health care provider. Make sure you discuss any questions you have with your health care provider. Document Released: 12/01/2008 Document Revised: 07/13/2015 Document Reviewed: 03/11/2014 Elsevier Interactive Patient Education  2017 [employer].

## 2022-06-03 NOTE — Progress Notes (Unsigned)
GI Office Note    Referring Provider: Tommie Sams, DO Primary Care Physician:  Tommie Sams, DO  Primary Gastroenterologist: Roetta Sessions, MD   Chief Complaint   No chief complaint on file.   History of Present Illness   Deborah Gray is a 89 y.o. female presenting today for ***,recently had abnormal CT imaging suggesting blockage. She has history of GERD, esophageal spasms/nutcracker esophagus, idiopathic chronic pancreatitis with chronic pancreatic exocrine insufficiency, IBS with intermittent diarrhea, hemorrhoids. She tries to avoid PPIs due to concerns for causing memory issues. After last EGD, has been on aciphex 20mg  daily.    CT A/P with contrast 02/2022: IMPRESSION: 1. No acute findings within the abdomen or pelvis. 2. Interval improvement in previous dilated fluid-filled small bowel loops. No pathologic dilatation of the large or small bowel loops on today's examination. 3. Moderate stool burden noted within the colon. Correlate for any clinical signs or symptoms of constipation. 4. Pulmonary nodule within the left upper lobe abutting the oblique fissure measures 1.4 cm. This is indeterminate. Advise dedicated CT of the chest to assess for additional nodules. 5. Persistent airspace disease within the lingula and right lower lobe. This can be further addressed on follow-up CT. 6. Small cystocele and small rectocele. 7. Unchanged superior endplate deformity involving the L1 vertebral body compared with 03/15/2021. 8.  Aortic Atherosclerosis (ICD10-I70.0).     Typically uses levsin, over holidays last year used more imodium but then ***    CT renal stone 02/21/22:  IMPRESSION: -Right lower lobe and middle lobe airspace opacities are noted concerning for pneumonia or atelectasis. -Moderately dilated small bowel loops are noted concerning for ileus or distal small bowel obstruction. -Sigmoid diverticulosis is noted without inflammation. Stool is  noted throughout the colon. -Aortic Atherosclerosis (ICD10-I70.0).   EGD 12/2021: -Distal esophageal rings,2 tandem. Overlying distal esophageal erosion consistent with erosive reflux esophagitis. Status post dilation.  -Status post esophageal biopsy as described -Inflamed appearing stomach of uncertain significance diffusely status post biopsy normal duodenal bulb and second portion of the duodenum. -Patient not on any meaningful acid suppression therapy. She takes pancreatic enzymes as well. Concerns about dementia risk with PPIs previously. In this setting, the benefits of acid suppression therapy with a PPI far outweighs any theoretical risks of dementia.                Medications   Current Outpatient Medications  Medication Sig Dispense Refill   acetaminophen (TYLENOL) 500 MG tablet Take 500 mg by mouth at bedtime.     ascorbic acid (VITAMIN C) 500 MG tablet Take 500 mg by mouth daily.     aspirin EC 81 MG tablet Take 81 mg by mouth at bedtime.     Azelastine-Fluticasone 137-50 MCG/ACT SUSP Place 1 spray into the nose every 12 (twelve) hours. 23 g 0   blood glucose meter kit and supplies KIT Dispense based on  insurance preference. Tests once a day E11.9 1 each 0   Carboxymethylcellulose Sodium (THERATEARS) 0.25 % SOLN Place 1 drop into both eyes 2 (two) times daily.     cholecalciferol (VITAMIN D3) 25 MCG (1000 UNIT) tablet Take 1,000 Units by mouth at bedtime.     clidinium-chlordiazePOXIDE (LIBRAX) 5-2.5 MG capsule TAKE 1 CAPSULE BY MOUTH 3 (THREE) TIMES DAILY AS NEEDED (FOR ESOPHAGEAL SYMPTOMS). 270 capsule 0   ezetimibe (ZETIA) 10 MG tablet TAKE 1 TABLET BY MOUTH EVERY DAY 90 tablet 1   Guaifenesin 1200 MG TB12 Take  1,200 mg by mouth daily with supper.     losartan (COZAAR) 50 MG tablet Take 1 tablet (50 mg total) by mouth daily. 90 tablet 3   meclizine (ANTIVERT) 25 MG tablet Take 1 tablet (25 mg total) by mouth 3 (three) times daily as needed. 30 tablet 1    metFORMIN (GLUCOPHAGE) 500 MG tablet TAKE ONE TABLET BY MOUTH EVERY MORNING ONE AT LUNCH AND 2 AT BEDTIME 360 tablet 1   methenamine (HIPREX) 1 g tablet TAKE 1 TABLET BY MOUTH 2 TIMES DAILY. 180 tablet 3   Multiple Vitamins-Minerals (PRESERVISION AREDS 2) CAPS Take 1 capsule by mouth 2 (two) times daily.      MYRBETRIQ 50 MG TB24 tablet TAKE 1 TABLET BY MOUTH EVERY DAY 90 tablet 3   nateglinide (STARLIX) 60 MG tablet Take 60 mg by mouth 2 (two) times daily with a meal.     omega-3 acid ethyl esters (LOVAZA) 1 g capsule Take 2 g by mouth every morning.     Pancrelipase, Lip-Prot-Amyl, (CREON) 24000-76000 units CPEP TAKE 3 CAPSULES THREE TIMES DAILY WITH MEALS AND ONE CAPSULE WITH SNACK TWICE DAILY 1000 capsule 5   RABEprazole (ACIPHEX) 20 MG tablet Take 1 tablet (20 mg total) by mouth daily. 30 tablet 11   sodium chloride (OCEAN) 0.65 % SOLN nasal spray Place 1 spray into both nostrils every morning.     No current facility-administered medications for this visit.    Allergies   Allergies as of 06/04/2022 - Review Complete 05/31/2022  Allergen Reaction Noted   Adhesive [tape] Other (See Comments) 07/21/2016   Estrogens Other (See Comments) 10/15/2012   Sulfonamide derivatives Rash 03/01/2008     Past Medical History   Past Medical History:  Diagnosis Date   Arthritis    Benign neoplasm of colon    Constipation    Diaphragmatic hernia without mention of obstruction or gangrene    Diverticulosis of colon (without mention of hemorrhage)    Dysphagia, pharyngoesophageal phase    Esophageal reflux    Essential hypertension    Gastritis    Heart murmur    Hemorrhoids    Hyperlipidemia    Internal hemorrhoids without mention of complication    Interstitial cystitis    Left wrist fracture    PONV (postoperative nausea and vomiting)    PSVT (paroxysmal supraventricular tachycardia)    Stricture and stenosis of esophagus    Type 2 diabetes mellitus     Past Surgical History    Past Surgical History:  Procedure Laterality Date   ABDOMINAL HYSTERECTOMY     BACTERIAL OVERGROWTH TEST N/A 09/06/2015   Procedure: BACTERIAL OVERGROWTH TEST;  Surgeon: Corbin Ade, MD;  [facility]: [facility];  Service: Endoscopy;  Laterality: N/A;  800   BIOPSY  01/02/2022   Procedure: BIOPSY;  Surgeon: Corbin Ade, MD;  Location: [facility];  Service: Endoscopy;;   BREAST LUMPECTOMY Right    benign   CATARACT EXTRACTION Bilateral 2006   Implants in both, surgeries done 6 weeks apart   CHOLECYSTECTOMY     COLONOSCOPY  03/08/02   Jarold Motto: diverticulosis, internal hemorrhoids, adenomatous colon polyp   COLONOSCOPY  01/23/04   Jarold Motto: diverticulosis, internal hemorrhoids   COLONOSCOPY  09/25/05   Jarold Motto: diverticulosis   COLONOSCOPY  07/05/09   Jarold Motto: severe diverticulosis   COLONOSCOPY  01/21/11   Jarold Motto: severe diverticulosis in sigmoid to desc colon, int hemorrhoids, follow up TCS in 5 years   COLONOSCOPY N/A 04/10/2016  [name]: diverticulosis   CYSTOSCOPY W/ URETERAL STENT PLACEMENT Right 03/15/2021   Procedure: CYSTOSCOPY WITH RETROGRADE PYELOGRAM/URETERAL STENT PLACEMENT;  Surgeon: Jannifer Hick, MD;  Location: [facility];  Service: Urology;  Laterality: Right;   CYSTOSCOPY WITH RETROGRADE PYELOGRAM, URETEROSCOPY AND STENT PLACEMENT Right 03/29/2021   Procedure: CYSTOSCOPY WITH RETROGRADE PYELOGRAM, URETEROSCOPY AND STENT EXCHANGE;  Surgeon: Malen Gauze, MD;  Location: [facility];  Service: Urology;  Laterality: Right;   DILATION AND CURETTAGE OF UTERUS     ESOPHAGEAL MANOMETRY  03/21/08   Patterson: findings c/w Nutcracker Esophagus   ESOPHAGOGASTRODUODENOSCOPY  03/08/02   Jarold Motto: esophageal stricture, chronic gerd, s/p dilation.    ESOPHAGOGASTRODUODENOSCOPY  01/23/04   Jarold Motto: esophageal stricture, gastritis, hiatal hernia   ESOPHAGOGASTRODUODENOSCOPY  09/25/05   Jarold Motto: gastritis, benign bx, no H.pylori   ESOPHAGOGASTRODUODENOSCOPY  07/05/09    Jarold Motto: gastropathy, benign small bowel bx and gastric bx   ESOPHAGOGASTRODUODENOSCOPY N/A 05/15/2015   Dr. Jena Gauss- diffuse moderate inflammation characterized by congestion (edema), erythema, and linear erosions was found in the entire examined stomach. bx= reactive gastropathy   ESOPHAGOGASTRODUODENOSCOPY (EGD) WITH PROPOFOL N/A 01/02/2022   Procedure: ESOPHAGOGASTRODUODENOSCOPY (EGD) WITH PROPOFOL;  Surgeon: Corbin Ade, MD;  [facility]: [facility];  Service: Endoscopy;  Laterality: N/A;  10:30am, asa 2   FOOT SURGERY Left    HEMORRHOID BANDING  2017   Dr.[name]   HOLMIUM LASER APPLICATION Right 03/29/2021   Procedure: HOLMIUM LASER APPLICATION;  Surgeon: Malen Gauze, MD;  Location: [facility];  Service: Urology;  Laterality: Right;   LAPAROSCOPIC VAGINAL HYSTERECTOMY     MALONEY DILATION N/A 01/02/2022   Procedure: Elease Hashimoto DILATION;  Surgeon: Corbin Ade, MD;  Location: [facility];  Service: Endoscopy;  Laterality: N/A;   ORIF WRIST FRACTURE Left 06/23/2018   Procedure: OPEN REDUCTION INTERNAL FIXATION (ORIF) LEFT WRIST FRACTURE;  Surgeon: Sheral Apley, MD;  Location: [facility];  Service: Orthopedics;  Laterality: Left;  regional arm block   RECTOCELE REPAIR     TOTAL KNEE ARTHROPLASTY Right     Past Family History   Family History  Problem Relation Age of Onset   Ovarian cancer Mother    Diabetes Father    Heart disease Father    Breast cancer Sister    Liver cancer Sister    Colon cancer Cousin        Paternal side   Diabetes Maternal Aunt    Liver disease Cousin        Maternal side, never drank    Past Social History   Social History   Socioeconomic History   Marital status: Widowed    Spouse name: Not on file   Number of children: 3   Years of education: Not on file   Highest education level: Not on file  Occupational History   Occupation: Retired  Tobacco Use   Smoking status: Never   Smokeless tobacco: Never   Tobacco  comments:    Never smoked  Vaping Use   Vaping Use: Never used  Substance and Sexual Activity   Alcohol use: No    Alcohol/week: 0.0 standard drinks of alcohol   Drug use: No   Sexual activity: Not Currently  Other Topics Concern   Not on file  Social History Narrative   Widowed since 07/2018. Married x 61 years.   3 sons. 8, 1963 and 1966.   Social Determinants of Health   Financial Resource Strain: Low Risk  (05/31/2022)   Overall  Financial Resource Strain (CARDIA)    Difficulty of Paying Living Expenses: Not hard at all  Food Insecurity: No Food Insecurity (05/31/2022)   Hunger Vital Sign    Worried About Running Out of Food in the Last Year: Never true    Ran Out of Food in the Last Year: Never true  Transportation Needs: No Transportation Needs (05/31/2022)   PRAPARE - Administrator, Civil Service (Medical): No    Lack of Transportation (Non-Medical): No  Physical Activity: Sufficiently Active (05/31/2022)   Exercise Vital Sign    Days of Exercise per Week: 5 days    Minutes of Exercise per Session: 30 min  Stress: No Stress Concern Present (05/31/2022)   [employer] of Occupational Health - Occupational Stress Questionnaire    Feeling of Stress : Not at all  Social Connections: Moderately Integrated (05/31/2022)   Social Connection and Isolation Panel [NHANES]    Frequency of Communication with Friends and Family: More than three times a week    Frequency of Social Gatherings with Friends and Family: More than three times a week    Attends Religious Services: More than 4 times per year    Active Member of [employer] or Organizations: Yes    Attends Banker Meetings: More than 4 times per year    Marital Status: Widowed  Intimate Partner Violence: Not At Risk (05/31/2022)   Humiliation, Afraid, Rape, and Kick questionnaire    Fear of Current or Ex-Partner: No    Emotionally Abused: No    Physically Abused: No    Sexually Abused: No     Review of Systems   General: Negative for anorexia, weight loss, fever, chills, fatigue, weakness. ENT: Negative for hoarseness, difficulty swallowing , nasal congestion. CV: Negative for chest pain, angina, palpitations, dyspnea on exertion, peripheral edema.  Respiratory: Negative for dyspnea at rest, dyspnea on exertion, cough, sputum, wheezing.  GI: See history of present illness. GU:  Negative for dysuria, hematuria, urinary incontinence, urinary frequency, nocturnal urination.  Endo: Negative for unusual weight change.     Physical Exam   There were no vitals taken for this visit.   General: Well-nourished, well-developed in no acute distress.  Eyes: No icterus. Mouth: Oropharyngeal mucosa moist and pink , no lesions erythema or exudate. Lungs: Clear to auscultation bilaterally.  Heart: Regular rate and rhythm, no murmurs rubs or gallops.  Abdomen: Bowel sounds are normal, nontender, nondistended, no hepatosplenomegaly or masses,  no abdominal bruits or hernia , no rebound or guarding.  Rectal: ***  Extremities: No lower extremity edema. No clubbing or deformities. Neuro: Alert and oriented x 4   Skin: Warm and dry, no jaundice.   Psych: Alert and cooperative, normal mood and affect.  Labs   Lab Results  Component Value Date   HGBA1C 6.4 (H) 04/08/2022   Lab Results  Component Value Date   CREATININE 0.70 04/08/2022   BUN 32 (H) 04/08/2022   NA 141 04/08/2022   K 5.1 04/08/2022   CL 103 04/08/2022   CO2 24 04/08/2022   Lab Results  Component Value Date   ALT 16 04/08/2022   AST 19 04/08/2022   ALKPHOS 91 04/08/2022   BILITOT 0.4 04/08/2022   Lab Results  Component Value Date   WBC 6.7 04/08/2022   HGB 13.2 04/08/2022   HCT 39.9 04/08/2022   MCV 87 04/08/2022   PLT 249 04/08/2022   Lab Results  Component Value Date   TSH 2.720 11/19/2021  Imaging Studies   No results found.  Assessment    GERD: doing well on aciphex.    Esophageal  spasms/nutcracker esophagus: doing well. Using librax prn, rarely needs.    IBS-D: has used increased amounts of imodium over the holidays to prevent diarrhea. Using levsin as well for cramps/loose stool/urgency. Recommend using Levsin sparingly right now. Hold off on imodium given current CT findings.    Chronic pancreatitis/EPI: doing well on Creon.    Abnormal small bowel on CT: ?ileus vs obstruction based on dilated small bowel loops. Patient denies symptoms suggestive of bowel obstructions. Dr. Everlene Other discussed CT findings with Dr. Algis Greenhouse (general surgery) who is advising CT A/P with oral and IV contrast. May require updated colonoscopy based on findings, change in stool caliber. Last colonoscopy 2018.      PLAN   ***   Leanna Battles. Melvyn Neth, MHS, PA-C [facility]

## 2022-06-04 ENCOUNTER — Encounter: Payer: Self-pay | Admitting: Gastroenterology

## 2022-06-04 ENCOUNTER — Telehealth: Payer: Self-pay | Admitting: Gastroenterology

## 2022-06-04 ENCOUNTER — Ambulatory Visit (INDEPENDENT_AMBULATORY_CARE_PROVIDER_SITE_OTHER): Payer: Medicare Other | Admitting: Gastroenterology

## 2022-06-04 VITALS — BP 105/68 | HR 84 | Temp 97.7°F | Ht 64.0 in | Wt 137.8 lb

## 2022-06-04 DIAGNOSIS — K580 Irritable bowel syndrome with diarrhea: Secondary | ICD-10-CM | POA: Diagnosis not present

## 2022-06-04 NOTE — Patient Instructions (Signed)
Resume Levsin (hyoscyamine) under the tongue up to twice daily for abdominal discomfort and stool urgency. Please let me know if your symptoms do not settle down. I will review your prior hemorrhoid banding with Lewie Loron and let you know if you should come back for possible repeat banding.

## 2022-06-04 NOTE — Telephone Encounter (Signed)
Please let patient know that Tobi Bastos said she could provide additional hemorrhoid banding. If she is interested, please bring her back on Anna's schedule for hemorrhoid banding.   She also has an appt with Dr. Jena Gauss 06/25/22 that she can cancel or keep depending on if she wants a follow up of issues we discussed today.

## 2022-06-04 NOTE — Telephone Encounter (Signed)
Lmom for pt to return my call.  

## 2022-06-05 NOTE — Telephone Encounter (Signed)
Pt was made aware and verbalized understanding.  

## 2022-06-19 ENCOUNTER — Ambulatory Visit (HOSPITAL_COMMUNITY)
Admission: RE | Admit: 2022-06-19 | Discharge: 2022-06-19 | Disposition: A | Discharge status: Home / Self Care | Payer: Medicare Other | Source: Ambulatory Visit | Attending: Internal Medicine | Admitting: Internal Medicine

## 2022-06-19 DIAGNOSIS — R918 Other nonspecific abnormal finding of lung field: Secondary | ICD-10-CM

## 2022-06-19 DIAGNOSIS — R911 Solitary pulmonary nodule: Secondary | ICD-10-CM | POA: Diagnosis not present

## 2022-06-19 DIAGNOSIS — I700 Atherosclerosis of aorta: Secondary | ICD-10-CM | POA: Diagnosis not present

## 2022-06-19 DIAGNOSIS — J479 Bronchiectasis, uncomplicated: Secondary | ICD-10-CM | POA: Diagnosis not present

## 2022-06-20 ENCOUNTER — Telehealth: Payer: Self-pay

## 2022-06-20 NOTE — Telephone Encounter (Signed)
I contacted Deborah Gray to move up her appointment. She advised she wished to speak with you regarding her bladder spasms and her feeling of not emptying her bladder. She advised that she has been having issues for the last 3 months and wanted to know if looking in her bladder would be an option.

## 2022-06-21 ENCOUNTER — Ambulatory Visit: Payer: 59 | Admitting: Urology

## 2022-06-21 ENCOUNTER — Ambulatory Visit (INDEPENDENT_AMBULATORY_CARE_PROVIDER_SITE_OTHER): Payer: Medicare Other

## 2022-06-21 DIAGNOSIS — N3010 Interstitial cystitis (chronic) without hematuria: Secondary | ICD-10-CM | POA: Diagnosis not present

## 2022-06-21 LAB — MICROSCOPIC EXAMINATION
Bacteria, UA: NONE SEEN
WBC, UA: >30 /hpf — AB (ref 0–5)

## 2022-06-21 LAB — URINALYSIS, ROUTINE W REFLEX MICROSCOPIC
Bilirubin, UA: NEGATIVE
Glucose, UA: NEGATIVE
Ketones, UA: NEGATIVE
Nitrite, UA: NEGATIVE
Protein,UA: NEGATIVE
Specific Gravity, UA: 1.015 (ref 1.005–1.030)
Urobilinogen, Ur: 0.2 mg/dL (ref 0.2–1.0)
pH, UA: 6 (ref 5.0–7.5)

## 2022-06-21 MED ORDER — SODIUM BICARBONATE 8.4 % IV SOLN
50.0000 meq | Freq: Once | INTRAVENOUS | Status: AC
Start: 2022-06-21 — End: 2022-06-21
  Administered 2022-06-21: 50 meq

## 2022-06-21 MED ORDER — DIMETHYL SULFOXIDE 50 % IS SOLN
50.0000 mL | Freq: Once | INTRAVESICAL | Status: AC
Start: 2022-06-21 — End: 2022-06-21
  Administered 2022-06-21: 50 mL via URETHRAL

## 2022-06-21 MED ORDER — HYDROCORTISONE SOD SUC (PF) 100 MG IJ SOLR
100.0000 mg | Freq: Once | INTRAMUSCULAR | Status: AC
Start: 2022-06-21 — End: 2022-06-21
  Administered 2022-06-21: 100 mg

## 2022-06-21 MED ORDER — LIDOCAINE HCL 2 % IJ SOLN
10.0000 mL | Freq: Once | INTRAMUSCULAR | Status: AC
Start: 2022-06-21 — End: 2022-06-21
  Administered 2022-06-21: 200 mg

## 2022-06-21 NOTE — Progress Notes (Signed)
Bladder Instillation DMSO  Due to Interstitial Cystitis patient is present today for a Bladder Instillation of DMSO treatment. Patient was cleaned and prepped in a sterile fashion with betadine.  A 20 FR catheter was inserted, urine return was noted 100 ml, urine was yellow in color.  DMSO treatment was instilled into the bladder. The catheter was then removed. Patient tolerated well, no complications were noted pt was instructed to hold the instillation for 20 minutes and then will void it out. Pt voiced understanding.    Preformed by: Kennyth Lose, CMA  Follow up/ Additional notes: Keep scheduled nurse visit

## 2022-06-21 NOTE — Telephone Encounter (Signed)
Patient seen in office today and scheduled to f/u with NP

## 2022-06-24 ENCOUNTER — Ambulatory Visit: Payer: 59

## 2022-06-24 NOTE — Progress Notes (Unsigned)
Deborah Gray, female    DOB: 28-Nov-1933    MRN: [id number]   Brief patient profile:  22 yowf never smoker  retired Geophysicist/field seismologist referred to pulmonary clinic in [place]  03/21/2022 by Dr Everlene Other  for incidental lung nodules on w/u for hematuria.   Remembers bad bronchitis age 89 had to miss school    History of Present Illness  03/21/2022  Pulmonary/ 1st office eval/ Deborah Gray / Sales executive Complaint  Patient presents with   Consult    Ct scan abnormal   Dyspnea:  [place]/ [place] cooking / limited by back but still doing [employer] / hc parking depends on whether back bothering her  Cough: none  but always botherd by nasal congestion f/u by [employer]  Sleep: occ night sweats x 2 year / no wt loss or resp cc noct  SABA use: none  02: none  Rec You multiple nodules that the other with largest calcified typical of a benign process  To be sure, we will repeat your CT no contrast  in 3 months and see you back afterwords   - 03/21/2022  ESR 9 - 03/21/2022  Quant TB neg - 03/21/2022 cbc with diff   Eos 0.1  06/25/2022  f/u ov/[place] office/Rane Dumm re: MPNs  maint on dymista / no other resp meds  Chief Complaint  Patient presents with   Follow-up    Pt f/u states that her breathing is fine  Dyspnea:  about the same / still doing grocery store  Cough: none unless active nasal drainage Sleeping: occ sweats head but not whole bed  SABA use: none 02: none  No longer losing wt    No obvious day to day or daytime variability or assoc excess/ purulent sputum or mucus plugs or hemoptysis or cp or chest tightness, subjective wheeze or overt sinus or hb symptoms.   Sleeping  without nocturnal  or early am exacerbation  of respiratory  c/o's or need for noct saba. Also denies any obvious fluctuation of symptoms with weather or environmental changes or other aggravating or alleviating factors except as outlined above   No unusual exposure hx or h/o childhood pna/  asthma or knowledge of premature birth.  Current Allergies, Complete Past Medical History, Past Surgical History, Family History, and Social History were reviewed in [employer] record.  ROS  The following are not active complaints unless bolded Hoarseness, sore throat, dysphagia, dental problems, itching, sneezing,  nasal congestion or discharge of excess mucus or purulent secretions, ear ache,   fever, chills, sweats, unintended wt loss or wt gain, classically pleuritic or exertional cp,  orthopnea pnd or arm/hand swelling  or leg swelling, presyncope, palpitations, abdominal pain, anorexia, nausea, vomiting, diarrhea  or change in bowel habits or change in bladder habits, change in stools or change in urine, dysuria, hematuria,  rash, arthralgias, visual complaints, headache, numbness, weakness or ataxia or problems with walking or coordination,  change in mood or  memory.        Current Meds  Medication Sig   acetaminophen (TYLENOL) 500 MG tablet Take 500 mg by mouth at bedtime.   ascorbic acid (VITAMIN C) 500 MG tablet Take 500 mg by mouth daily.   aspirin EC 81 MG tablet Take 81 mg by mouth at bedtime.   Azelastine-Fluticasone 137-50 MCG/ACT SUSP Place 1 spray into the nose every 12 (twelve) hours.   blood glucose meter kit and supplies KIT Dispense  based on  insurance preference. Tests once a day E11.9   Carboxymethylcellulose Sodium (THERATEARS) 0.25 % SOLN Place 1 drop into both eyes 2 (two) times daily.   cholecalciferol (VITAMIN D3) 25 MCG (1000 UNIT) tablet Take 1,000 Units by mouth at bedtime.   clidinium-chlordiazePOXIDE (LIBRAX) 5-2.5 MG capsule TAKE 1 CAPSULE BY MOUTH 3 (THREE) TIMES DAILY AS NEEDED (FOR ESOPHAGEAL SYMPTOMS).   ezetimibe (ZETIA) 10 MG tablet TAKE 1 TABLET BY MOUTH EVERY DAY   Guaifenesin 1200 MG TB12 Take 1,200 mg by mouth daily with supper.   Hyoscyamine Sulfate SL 0.125 MG SUBL    losartan (COZAAR) 50 MG tablet Take 1 tablet (50 mg total)  by mouth daily.   meclizine (ANTIVERT) 25 MG tablet Take 1 tablet (25 mg total) by mouth 3 (three) times daily as needed.   metFORMIN (GLUCOPHAGE) 500 MG tablet TAKE ONE TABLET BY MOUTH EVERY MORNING ONE AT LUNCH AND 2 AT BEDTIME   methenamine (HIPREX) 1 g tablet TAKE 1 TABLET BY MOUTH 2 TIMES DAILY.   Multiple Vitamins-Minerals (PRESERVISION AREDS 2) CAPS Take 1 capsule by mouth 2 (two) times daily.    MYRBETRIQ 50 MG TB24 tablet TAKE 1 TABLET BY MOUTH EVERY DAY   nateglinide (STARLIX) 60 MG tablet Take 60 mg by mouth 2 (two) times daily with a meal.   omega-3 acid ethyl esters (LOVAZA) 1 g capsule Take 2 g by mouth every morning.   Pancrelipase, Lip-Prot-Amyl, (CREON) 24000-76000 units CPEP TAKE 3 CAPSULES THREE TIMES DAILY WITH MEALS AND ONE CAPSULE WITH SNACK TWICE DAILY   RABEprazole (ACIPHEX) 20 MG tablet Take 1 tablet (20 mg total) by mouth daily.   sodium chloride (OCEAN) 0.65 % SOLN nasal spray Place 1 spray into both nostrils every morning.                  Past Medical History:  Diagnosis Date   Arthritis    Benign neoplasm of colon    Constipation    Diaphragmatic hernia without mention of obstruction or gangrene    Diverticulosis of colon (without mention of hemorrhage)    Dysphagia, pharyngoesophageal phase    Esophageal reflux    Essential hypertension    Heart murmur    Hemorrhoids    Hyperlipidemia    Internal hemorrhoids without mention of complication    Left wrist fracture    PONV (postoperative nausea and vomiting)    Stricture and stenosis of esophagus    Type 2 diabetes mellitus (HCC)    Unspecified gastritis and gastroduodenitis without mention of hemorrhage        Objective:       Wt Readings from Last 3 Encounters:  06/25/22 141 lb 9.6 oz (64.2 kg)  06/04/22 137 lb 12.8 oz (62.5 kg)  05/31/22 139 lb (63 kg)    Vital signs reviewed  06/25/2022  - Note at rest 02 sats  96% on RA   General appearance:    pleasant extremely verbose amb wm nad     HEENT : Oropharynx  clear        NECK :  without  apparent JVD/ palpable Nodes/TM    LUNGS: no acc muscle use,  Kyphotic  contour chest which is clear to A and P bilaterally without cough on insp or exp maneuvers   CV:  RRR  no s3 or murmur or increase in P2, and no edema   ABD:  soft and nontender with nl inspiratory excursion in the supine position. No bruits  or organomegaly appreciated   MS:   slow stooped over slt awkward gaitext warm without deformities Or obvious joint restrictions  calf tenderness, cyanosis or clubbing    SKIN: warm and dry without lesions    NEURO:  alert, approp, nl sensorium with  no motor or cerebellar deficits apparent.         I personally reviewed images and agree with radiology impression as follows:   Chest CT  06/19/22 1. Interval improvement in the patchy and nodular airspace disease in the posterior right lower lobe without resolution. Imaging features remain suspicious for infectious/inflammatory etiology and associated component of scarring not excluded. 2. The 1.5 x 1.2 cm posterior left upper lobe nodule seen previously measures 1.3 x 0.9 cm today. This is likely infectious/inflammatory given the interval improvement. Continued surveillance warranted. 3. Stable bronchiectasis with volume loss and consolidative opacity in the posterior right middle lobe. 4. Stable L1 compression deformity. 5. Coronary artery calcification. 6.  Aortic Atherosclerosis (ICD10-I70.0).  CXR PA and Lateral:   06/25/2022 :    I personally reviewed images and impression is as follows:     No obvious changes vs priors c/w bronchiectasis   Assessment

## 2022-06-25 ENCOUNTER — Ambulatory Visit (HOSPITAL_COMMUNITY)
Admission: RE | Admit: 2022-06-25 | Discharge: 2022-06-25 | Disposition: A | Discharge status: Home / Self Care | Payer: Medicare Other | Source: Ambulatory Visit | Attending: Internal Medicine | Admitting: Internal Medicine

## 2022-06-25 ENCOUNTER — Ambulatory Visit (INDEPENDENT_AMBULATORY_CARE_PROVIDER_SITE_OTHER): Payer: Medicare Other | Admitting: Internal Medicine

## 2022-06-25 ENCOUNTER — Ambulatory Visit: Payer: 59 | Admitting: Internal Medicine

## 2022-06-25 ENCOUNTER — Encounter: Payer: Self-pay | Admitting: Internal Medicine

## 2022-06-25 VITALS — BP 143/70 | HR 90 | Ht 64.0 in | Wt 141.6 lb

## 2022-06-25 DIAGNOSIS — R918 Other nonspecific abnormal finding of lung field: Secondary | ICD-10-CM

## 2022-06-25 DIAGNOSIS — J479 Bronchiectasis, uncomplicated: Secondary | ICD-10-CM | POA: Diagnosis not present

## 2022-06-25 DIAGNOSIS — J449 Chronic obstructive pulmonary disease, unspecified: Secondary | ICD-10-CM | POA: Diagnosis not present

## 2022-06-25 DIAGNOSIS — J439 Emphysema, unspecified: Secondary | ICD-10-CM | POA: Diagnosis not present

## 2022-06-25 DIAGNOSIS — R911 Solitary pulmonary nodule: Secondary | ICD-10-CM | POA: Diagnosis not present

## 2022-06-25 NOTE — Patient Instructions (Signed)
Please remember to go to the  x-ray department  @  [facility] for your tests - we will call you with the results when they are available      Please schedule a follow up visit in 6 months but call sooner if needed with cxr on return as well

## 2022-06-25 NOTE — Assessment & Plan Note (Signed)
See incidental CT chest 03/20/22 during w/u for hematuria -  03/21/2022  ESR 9 - 03/21/2022  Quant TB neg - 03/21/2022 cbc with diff   Eos 0.1 -  Chest CT  06/19/22 improved MPN/ wax and wane and assoc bronchiectasis   Likely MAI  with assoc bronchiectasis/ nodules which  is an extremely common benign condition in the elderly and does not warrant aggressive eval/ rx at this point unless there is a clinical correlation suggesting unaddressed pulmonary infection (purulent sputum, night sweats, unintended wt loss, doe) or evolution of  obvious changes on plain cxr (as opposed to serial CT, which is way over sensitive to make clinical decisions re intervention and treatment in the elderly, who tend to tolerate both dx and treatment poorly) .   Cxr baseline today and repeat in 6 m, sooner if needed with sputum samples if practical before considering any empirical rx for atypical tb or other opportunistic organisms  Discussed in detail all the  indications, usual  risks and alternatives  relative to the benefits with patient who agrees to proceed with conservative f/u as outlined           Each maintenance medication was reviewed in detail including emphasizing most importantly the difference between maintenance and prns and under what circumstances the prns are to be triggered using an action plan format where appropriate.  Total time for H and P, chart review, counseling, reviewing nasal device(s) and generating customized AVS unique to this office visit / same day charting = 24 min

## 2022-06-27 ENCOUNTER — Encounter: Payer: Self-pay | Admitting: Gastroenterology

## 2022-06-27 ENCOUNTER — Telehealth: Payer: Self-pay | Admitting: Internal Medicine

## 2022-06-27 ENCOUNTER — Ambulatory Visit (INDEPENDENT_AMBULATORY_CARE_PROVIDER_SITE_OTHER): Payer: Medicare Other | Admitting: Gastroenterology

## 2022-06-27 VITALS — BP 125/75 | HR 85 | Temp 97.8°F | Ht 64.0 in | Wt 139.6 lb

## 2022-06-27 DIAGNOSIS — K642 Third degree hemorrhoids: Secondary | ICD-10-CM

## 2022-06-27 NOTE — Telephone Encounter (Signed)
Deborah Gray states CT scan has been denied. Denied due to not medically necessary. Deborah Gray phone number is [phone].

## 2022-06-27 NOTE — Progress Notes (Signed)
    [facility] BANDING PROCEDURE NOTE  Deborah Gray is an 89 y.o. female presenting today for consideration of hemorrhoid banding. Last colonoscopy 2018 with diverticulosis. Prior banding in March/May 2021 by Dr. Jena Gauss with left lateral and right anterior. She has had neutral banding last year. Noting prolapse, pressure.    The patient presents with symptomatic grade 3 hemorrhoids, unresponsive to maximal medical therapy, requesting rubber band ligation of her hemorrhoidal disease. All risks, benefits, and alternative forms of therapy were described and informed consent was obtained.  In the left lateral decubitus position, anoscopic examination revealed grade 3 hemorrhoids in the right anterior position predominantly.   The decision was made to band the right anterior internal hemorrhoid, and the [facility] O'Regan System was used to perform band ligation without complication. Digital anorectal examination was then performed to assure proper positioning of the band, and to adjust the banded tissue as required. The patient was discharged home without pain or other issues. Dietary and behavioral recommendations were given, along with follow-up instructions. The patient will return in several weeks for followup and possible additional banding as required.  No complications were encountered and the patient tolerated the procedure well.   Gelene Mink, PhD, ANP-BC [facility]

## 2022-06-27 NOTE — Patient Instructions (Signed)
  Please avoid straining.  You should limit your toilet time to 2-3 minutes at the most.   I recommend Benefiber 2 teaspoons each morning in the beverage of your choice!  Please call me with any concerns or issues!  I will see you in follow-up for additional banding in several weeks.    I enjoyed seeing you again today! At our first visit, I mentioned how I value our relationship and want to provide genuine, compassionate, and quality care. You may receive a survey regarding your visit with me, and I welcome your feedback! Thanks so much for taking the time to complete this. I look forward to seeing you again.   Natividad Schlosser W. Anokhi Shannon, PhD, ANP-BC [facility]      

## 2022-06-28 NOTE — Telephone Encounter (Signed)
Benna Dunks can you assist with this? Thanks

## 2022-07-02 DIAGNOSIS — H34832 Tributary (branch) retinal vein occlusion, left eye, with macular edema: Secondary | ICD-10-CM | POA: Diagnosis not present

## 2022-07-02 DIAGNOSIS — Z961 Presence of intraocular lens: Secondary | ICD-10-CM | POA: Diagnosis not present

## 2022-07-02 DIAGNOSIS — H353132 Nonexudative age-related macular degeneration, bilateral, intermediate dry stage: Secondary | ICD-10-CM | POA: Diagnosis not present

## 2022-07-02 DIAGNOSIS — E119 Type 2 diabetes mellitus without complications: Secondary | ICD-10-CM | POA: Diagnosis not present

## 2022-07-02 LAB — HM DIABETES EYE EXAM

## 2022-07-03 ENCOUNTER — Ambulatory Visit (INDEPENDENT_AMBULATORY_CARE_PROVIDER_SITE_OTHER): Payer: Medicare Other | Admitting: Family Medicine

## 2022-07-03 ENCOUNTER — Encounter: Payer: Self-pay | Admitting: Family Medicine

## 2022-07-03 VITALS — BP 131/71 | HR 84 | Temp 97.3°F | Ht 64.0 in | Wt 140.0 lb

## 2022-07-03 DIAGNOSIS — I10 Essential (primary) hypertension: Secondary | ICD-10-CM

## 2022-07-03 DIAGNOSIS — E785 Hyperlipidemia, unspecified: Secondary | ICD-10-CM

## 2022-07-03 DIAGNOSIS — E1100 Type 2 diabetes mellitus with hyperosmolarity without nonketotic hyperglycemic-hyperosmolar coma (NKHHC): Secondary | ICD-10-CM | POA: Diagnosis not present

## 2022-07-03 DIAGNOSIS — Z7984 Long term (current) use of oral hypoglycemic drugs: Secondary | ICD-10-CM

## 2022-07-03 DIAGNOSIS — M810 Age-related osteoporosis without current pathological fracture: Secondary | ICD-10-CM | POA: Diagnosis not present

## 2022-07-03 DIAGNOSIS — K580 Irritable bowel syndrome with diarrhea: Secondary | ICD-10-CM | POA: Diagnosis not present

## 2022-07-03 NOTE — Telephone Encounter (Signed)
I will forward to one of the PCC's who do insurance [employer] to check into this

## 2022-07-03 NOTE — Patient Instructions (Signed)
Consider Bone density scan.  Follow up in 6 months.  Take care  Dr. Adriana Simas

## 2022-07-05 DIAGNOSIS — M810 Age-related osteoporosis without current pathological fracture: Secondary | ICD-10-CM | POA: Insufficient documentation

## 2022-07-05 NOTE — Assessment & Plan Note (Signed)
Stable. Continue Losartan. 

## 2022-07-05 NOTE — Assessment & Plan Note (Signed)
Patient wants to wait on Dexa. Will order at follow up.

## 2022-07-05 NOTE — Assessment & Plan Note (Signed)
Stable. Continue Starlix and Metformin.

## 2022-07-05 NOTE — Progress Notes (Signed)
Subjective:  Patient ID: Deborah Gray, female    DOB: [birth year]  Age: 89 y.o. MRN: [id number]  CC: Chief Complaint  Patient presents with   Diabetes    Follow up    HPI:  89 year old female presents for follow-up.  Patient overall is doing well at this time.  Patient is having some low back pain.  Prior CT imaging revealed L1 compression deformity. She needs Dexa scan. Will discuss this today.   HTN is stable on losartan.  DM-2 is well controlled on Starlix and Metformin.  Patient has a lot of GI issues. Follows closely with GI. Stable.   Lipids fairly well controlled on Zetia.  Patient Active Problem List   Diagnosis Date Noted   Osteoporosis 07/05/2022   Multiple pulmonary nodules determined by computed tomography of lung 03/21/2022   History of UTI 02/21/2022   Nephrolithiasis 03/15/2021   Prolapsed internal hemorrhoids, grade 3 01/23/2021   IBS (irritable bowel syndrome) 01/30/2015   Type 2 diabetes mellitus (HCC) 12/22/2014   Hyperlipidemia, unspecified 12/22/2014   Essential hypertension 11/25/2013   Chronic pancreatitis (HCC) 02/26/2011   Gastroesophageal reflux disease 01/18/2011   DJD (degenerative joint disease) 11/29/2010    Social Hx   Social History   Socioeconomic History   Marital status: Widowed    Spouse name: Not on file   Number of children: 3   Years of education: Not on file   Highest education level: Not on file  Occupational History   Occupation: Retired  Tobacco Use   Smoking status: Never   Smokeless tobacco: Never   Tobacco comments:    Never smoked  Vaping Use   Vaping Use: Never used  Substance and Sexual Activity   Alcohol use: No    Alcohol/week: 0.0 standard drinks of alcohol   Drug use: No   Sexual activity: Not Currently  Other Topics Concern   Not on file  Social History Narrative   Widowed since 07/2018. Married x 61 years.   3 sons. 65, 1963 and 1966.   Social Determinants of Health   Financial  Resource Strain: Low Risk  (05/31/2022)   Overall Financial Resource Strain (CARDIA)    Difficulty of Paying Living Expenses: Not hard at all  Food Insecurity: No Food Insecurity (05/31/2022)   Hunger Vital Sign    Worried About Running Out of Food in the Last Year: Never true    Ran Out of Food in the Last Year: Never true  Transportation Needs: No Transportation Needs (05/31/2022)   PRAPARE - Administrator, Civil Service (Medical): No    Lack of Transportation (Non-Medical): No  Physical Activity: Sufficiently Active (05/31/2022)   Exercise Vital Sign    Days of Exercise per Week: 5 days    Minutes of Exercise per Session: 30 min  Stress: No Stress Concern Present (05/31/2022)   [employer] of Occupational Health - Occupational Stress Questionnaire    Feeling of Stress : Not at all  Social Connections: Moderately Integrated (05/31/2022)   Social Connection and Isolation Panel [NHANES]    Frequency of Communication with Friends and Family: More than three times a week    Frequency of Social Gatherings with Friends and Family: More than three times a week    Attends Religious Services: More than 4 times per year    Active Member of [employer] or Organizations: Yes    Attends Banker Meetings: More than 4 times per year    Marital Status:  Widowed    Review of Systems Per HPI  Objective:  BP 131/71   Pulse 84   Temp (!) 97.3 F (36.3 C)   Ht 5\' 4"  (1.626 m)   Wt 140 lb (63.5 kg)   SpO2 100%   BMI 24.03 kg/m      07/03/2022   10:22 AM 06/27/2022   11:52 AM 06/27/2022   11:46 AM  BP/Weight  Systolic BP 131 125 155  Diastolic BP 71 75 84  Wt. (Lbs) 140  139.6  BMI 24.03 kg/m2  23.96 kg/m2    Physical Exam Vitals and nursing note reviewed.  Constitutional:      General: She is not in acute distress.    Appearance: Normal appearance.  HENT:     Head: Normocephalic and atraumatic.  Eyes:     General:        Right eye: No discharge.        Left  eye: No discharge.     Conjunctiva/sclera: Conjunctivae normal.  Cardiovascular:     Rate and Rhythm: Normal rate and regular rhythm.  Pulmonary:     Effort: Pulmonary effort is normal.     Breath sounds: Normal breath sounds.  Abdominal:     General: There is no distension.     Palpations: Abdomen is soft.     Tenderness: There is no abdominal tenderness.  Neurological:     Mental Status: She is alert.  Psychiatric:        Mood and Affect: Mood normal.        Behavior: Behavior normal.     Lab Results  Component Value Date   WBC 6.7 04/08/2022   HGB 13.2 04/08/2022   HCT 39.9 04/08/2022   PLT 249 04/08/2022   GLUCOSE 139 (H) 04/08/2022   CHOL 173 04/08/2022   TRIG 67 04/08/2022   HDL 71 04/08/2022   LDLCALC 89 04/08/2022   ALT 16 04/08/2022   AST 19 04/08/2022   NA 141 04/08/2022   K 5.1 04/08/2022   CL 103 04/08/2022   CREATININE 0.70 04/08/2022   BUN 32 (H) 04/08/2022   CO2 24 04/08/2022   TSH 2.720 11/19/2021   INR 1.7 (H) 04/08/2008   HGBA1C 6.4 (H) 04/08/2022   MICROALBUR 1.0 11/22/2013     Assessment & Plan:   Problem List Items Addressed This Visit       Cardiovascular and Mediastinum   Essential hypertension - Primary    Stable. Continue Losartan.        Digestive   IBS (irritable bowel syndrome)    Stable.        Endocrine   Type 2 diabetes mellitus (HCC)    Stable. Continue Starlix and Metformin.        Musculoskeletal and Integument   Osteoporosis    Patient wants to wait on Dexa. Will order at follow up.        Other   Hyperlipidemia, unspecified    Continue Zetia.        Follow-up:  6 months  Damien Cisar Adriana Simas DO [facility]

## 2022-07-05 NOTE — Assessment & Plan Note (Signed)
Stable

## 2022-07-05 NOTE — Assessment & Plan Note (Signed)
Continue Zetia. °

## 2022-07-10 ENCOUNTER — Ambulatory Visit: Payer: Medicare Other

## 2022-07-10 ENCOUNTER — Telehealth: Payer: Self-pay

## 2022-07-10 DIAGNOSIS — R300 Dysuria: Secondary | ICD-10-CM

## 2022-07-10 DIAGNOSIS — R109 Unspecified abdominal pain: Secondary | ICD-10-CM | POA: Diagnosis not present

## 2022-07-10 DIAGNOSIS — Z87440 Personal history of urinary (tract) infections: Secondary | ICD-10-CM

## 2022-07-10 DIAGNOSIS — N3010 Interstitial cystitis (chronic) without hematuria: Secondary | ICD-10-CM | POA: Diagnosis not present

## 2022-07-10 LAB — MICROSCOPIC EXAMINATION: WBC, UA: >30 /hpf — AB (ref 0–5)

## 2022-07-10 LAB — URINALYSIS, ROUTINE W REFLEX MICROSCOPIC
Bilirubin, UA: NEGATIVE
Glucose, UA: NEGATIVE
Ketones, UA: NEGATIVE
Nitrite, UA: NEGATIVE
Specific Gravity, UA: 1.02 (ref 1.005–1.030)
Urobilinogen, Ur: 0.2 mg/dL (ref 0.2–1.0)
pH, UA: 6 (ref 5.0–7.5)

## 2022-07-10 MED ORDER — NITROFURANTOIN MONOHYD MACRO 100 MG PO CAPS
100.0000 mg | ORAL_CAPSULE | Freq: Two times a day (BID) | ORAL | 0 refills | Status: DC
Start: 2022-07-10 — End: 2022-10-22

## 2022-07-10 NOTE — Telephone Encounter (Signed)
Patient is aware of Dr. Ronne Binning recommendation, . Abt Macrobid 100 mg bid X 7 sent to  pharmacy to cover infection. Patient voiced understanding

## 2022-07-10 NOTE — Progress Notes (Signed)
Patient presents today with complaints of  frequent, urgency, and bladder spasms.  UA and Culture done today.  Dr. Ronne Binning reviewed results and treatment started, abt Macrobid 100 mg bid X 7. Patient aware of MD recommendations and that we will reach out with culture results.      [id number], CMA

## 2022-07-10 NOTE — Telephone Encounter (Signed)
Patient called requesting to drop off a urine specimen today. She advised she is having frequency and urgency and multiple bladder spasms. She has a DSMO treatment on 5/29 and wants to ensure she doe not have an infection prior to her treatment.

## 2022-07-10 NOTE — Telephone Encounter (Signed)
Patient is scheduled for urine drop off 05/23

## 2022-07-11 ENCOUNTER — Ambulatory Visit: Payer: Medicare Other

## 2022-07-11 ENCOUNTER — Telehealth: Payer: Self-pay

## 2022-07-11 NOTE — Telephone Encounter (Signed)
Does she want to skip her 05/29 appointment?

## 2022-07-11 NOTE — Telephone Encounter (Signed)
Patient called to check if it was ok to wait until 08-14-22 appointment.  Please advise.

## 2022-07-12 LAB — URINE CULTURE

## 2022-07-16 ENCOUNTER — Other Ambulatory Visit: Payer: Self-pay | Admitting: Student

## 2022-07-16 ENCOUNTER — Encounter: Payer: Self-pay | Admitting: *Deleted

## 2022-07-17 ENCOUNTER — Other Ambulatory Visit: Payer: Self-pay | Admitting: Gastroenterology

## 2022-07-17 ENCOUNTER — Ambulatory Visit (INDEPENDENT_AMBULATORY_CARE_PROVIDER_SITE_OTHER): Payer: Medicare Other

## 2022-07-17 ENCOUNTER — Other Ambulatory Visit: Payer: Self-pay | Admitting: Family Medicine

## 2022-07-17 DIAGNOSIS — N3010 Interstitial cystitis (chronic) without hematuria: Secondary | ICD-10-CM | POA: Diagnosis not present

## 2022-07-17 DIAGNOSIS — E1100 Type 2 diabetes mellitus with hyperosmolarity without nonketotic hyperglycemic-hyperosmolar coma (NKHHC): Secondary | ICD-10-CM

## 2022-07-17 MED ORDER — DIMETHYL SULFOXIDE 50 % IS SOLN
50.0000 mL | Freq: Once | INTRAVESICAL | Status: AC
Start: 2022-07-17 — End: 2022-07-17
  Administered 2022-07-17: 50 mL via URETHRAL

## 2022-07-17 MED ORDER — PHENAZOPYRIDINE HCL 100 MG PO TABS
100.0000 mg | ORAL_TABLET | Freq: Three times a day (TID) | ORAL | 0 refills | Status: DC | PRN
Start: 2022-07-17 — End: 2022-08-06

## 2022-07-17 MED ORDER — SODIUM BICARBONATE 8.4 % IV SOLN
50.0000 meq | Freq: Once | INTRAVENOUS | Status: AC
Start: 2022-07-17 — End: 2022-07-17
  Administered 2022-07-17: 50 meq

## 2022-07-17 MED ORDER — LIDOCAINE HCL 2 % IJ SOLN
10.0000 mL | Freq: Once | INTRAMUSCULAR | Status: AC
Start: 2022-07-17 — End: 2022-07-17
  Administered 2022-07-17: 200 mg

## 2022-07-17 MED ORDER — HYDROCORTISONE SOD SUC (PF) 100 MG IJ SOLR
100.0000 mg | Freq: Once | INTRAMUSCULAR | Status: AC
Start: 2022-07-17 — End: 2022-07-17
  Administered 2022-07-17: 100 mg

## 2022-07-17 NOTE — Progress Notes (Signed)
Bladder Instillation DMSO  Due to Interstitial Cystitis  patient is present today for a Bladder Instillation of DMSO treatment. Patient was cleaned and prepped in a sterile fashion with betadine.  A 22 FR catheter was inserted, urine return was noted 150 ml, urine was yellow  in color.  DMSO treatment was instilled into the bladder. The catheter was then removed. after 20 minutes in office. Patient tolerated well, no complications were noted pt was instructed to hold the instillation for 20 minutes and then will void it out. Pt voiced understanding.    Preformed by: Kennyth Lose, CMA  Follow up/ Additional notes: Keep scheduled

## 2022-07-18 ENCOUNTER — Ambulatory Visit (INDEPENDENT_AMBULATORY_CARE_PROVIDER_SITE_OTHER): Payer: Medicare Other | Admitting: Gastroenterology

## 2022-07-18 ENCOUNTER — Encounter: Payer: Self-pay | Admitting: Gastroenterology

## 2022-07-18 VITALS — BP 132/73 | HR 87 | Temp 98.0°F | Ht 64.0 in | Wt 138.2 lb

## 2022-07-18 DIAGNOSIS — K642 Third degree hemorrhoids: Secondary | ICD-10-CM | POA: Diagnosis not present

## 2022-07-18 LAB — URINALYSIS, ROUTINE W REFLEX MICROSCOPIC
Bilirubin, UA: NEGATIVE
Glucose, UA: NEGATIVE
Ketones, UA: NEGATIVE
Nitrite, UA: NEGATIVE
Protein,UA: NEGATIVE
RBC, UA: NEGATIVE
Specific Gravity, UA: 1.02 (ref 1.005–1.030)
Urobilinogen, Ur: 0.2 mg/dL (ref 0.2–1.0)
pH, UA: 5.5 (ref 5.0–7.5)

## 2022-07-18 LAB — MICROSCOPIC EXAMINATION: Bacteria, UA: NONE SEEN

## 2022-07-18 NOTE — Progress Notes (Signed)
    [facility] BANDING PROCEDURE NOTE  Deborah Gray is an 89 y.o. female presenting today for consideration of hemorrhoid banding. Last colonoscopy  2018 with diverticulosis. Prior banding in March/May 2021 by Dr. Jena Gauss with left lateral and right anterior. She has had neutral banding last year. Noting prolapse, pressure. Anoscopy in May 2024 with Grade 3 in right anterior predominantly. She has had banding of right anterior thus far. Improvement noted.    The patient presents with symptomatic grade 3 hemorrhoids, unresponsive to maximal medical therapy, requesting rubber band ligation of her hemorrhoidal disease. All risks, benefits, and alternative forms of therapy were described and informed consent was obtained.  The decision was made to band the left lateral internal hemorrhoid, and the [facility] O'Regan System was used to perform band ligation without complication. Digital anorectal examination was then performed to assure proper positioning of the band, and to adjust the banded tissue as required. The patient was discharged home without pain or other issues. Dietary and behavioral recommendations were given, along with follow-up instructions. The patient will return in several weeks for followup and possible additional banding as required.  No complications were encountered and the patient tolerated the procedure well.   Gelene Mink, PhD, ANP-BC [facility]

## 2022-07-18 NOTE — Patient Instructions (Signed)
  Please avoid straining.  You should limit your toilet time to 2-3 minutes at the most.   I recommend Benefiber 2 teaspoons each morning in the beverage of your choice!  Please call me with any concerns or issues!  I will see you in follow-up for additional banding in several weeks.   I enjoyed seeing you again today! I value our relationship and want to provide genuine, compassionate, and quality care. You may receive a survey regarding your visit with me, and I welcome your feedback! Thanks so much for taking the time to complete this. I look forward to seeing you again.      Mckynzie Liwanag W. Willma Obando, PhD, ANP-BC [facility]       

## 2022-07-25 ENCOUNTER — Telehealth: Payer: Self-pay

## 2022-07-25 DIAGNOSIS — E1142 Type 2 diabetes mellitus with diabetic polyneuropathy: Secondary | ICD-10-CM | POA: Diagnosis not present

## 2022-07-25 DIAGNOSIS — L84 Corns and callosities: Secondary | ICD-10-CM | POA: Diagnosis not present

## 2022-07-25 DIAGNOSIS — B351 Tinea unguium: Secondary | ICD-10-CM | POA: Diagnosis not present

## 2022-07-25 NOTE — Telephone Encounter (Signed)
Patient called and advised they needed a refill on medication below. She advised her bladder spasms are getting worse and she wanted to ensure she was ok to take more of this medication. She advised she will be at another appointment today at 3:00 PM and wont be home to take any calls.    Medication: phenazopyridine (PYRIDIUM) 100 MG tablet     Pharmacy: [id number] - [place], [place] - [address] AT [facility]   Thank you

## 2022-07-25 NOTE — Telephone Encounter (Signed)
Spoke with patient and she states she still has a few pyridium left to take. Patient will come drop off urine specimen in the morning to rule out infection.

## 2022-07-26 ENCOUNTER — Ambulatory Visit: Payer: Medicare Other

## 2022-07-29 DIAGNOSIS — L280 Lichen simplex chronicus: Secondary | ICD-10-CM | POA: Diagnosis not present

## 2022-07-30 ENCOUNTER — Telehealth: Payer: Self-pay

## 2022-07-30 NOTE — Telephone Encounter (Signed)
Pt phoned this morning stating that every since the last banding (last week) she has had pale mucus with BM. She has trouble with BM's now, more oozing of the pale mucus. Last night bright red blood (went from a lot to a little). Pt taking her benefiber but when she has a BM its very little and she can't clean up enough because of the mucus just continuing to become more of a problem. Pt wants to know that she has another banding coming up and wants to know if you want to see her or call something in. Stated she is very [employer], [employer] of having another done. Please advise.

## 2022-07-30 NOTE — Telephone Encounter (Signed)
Phoned and LMOVM for the pt stating per Tobi Bastos to bump up the pt's appt sooner than July.   Mandy please schedule the pt earlier than July. She is having issues regarding her hemorrhoids

## 2022-07-30 NOTE — Telephone Encounter (Signed)
I think another banding would be helpful. Would be best to see her and we can bump up that appt in July to sooner.

## 2022-07-30 NOTE — Telephone Encounter (Signed)
noted 

## 2022-07-31 ENCOUNTER — Encounter: Payer: Medicare Other | Admitting: Gastroenterology

## 2022-08-01 ENCOUNTER — Telehealth: Payer: Self-pay

## 2022-08-01 ENCOUNTER — Encounter: Payer: Medicare Other | Admitting: Gastroenterology

## 2022-08-01 ENCOUNTER — Ambulatory Visit (INDEPENDENT_AMBULATORY_CARE_PROVIDER_SITE_OTHER): Payer: Medicare Other

## 2022-08-01 DIAGNOSIS — N390 Urinary tract infection, site not specified: Secondary | ICD-10-CM | POA: Diagnosis not present

## 2022-08-01 DIAGNOSIS — N3010 Interstitial cystitis (chronic) without hematuria: Secondary | ICD-10-CM | POA: Diagnosis not present

## 2022-08-01 LAB — URINALYSIS, ROUTINE W REFLEX MICROSCOPIC
Bilirubin, UA: NEGATIVE
Glucose, UA: NEGATIVE
Ketones, UA: NEGATIVE
Leukocytes,UA: NEGATIVE
Nitrite, UA: NEGATIVE
Protein,UA: NEGATIVE
RBC, UA: NEGATIVE
Specific Gravity, UA: <1.005 — ABNORMAL LOW (ref 1.005–1.030)
Urobilinogen, Ur: 0.2 mg/dL (ref 0.2–1.0)
pH, UA: 6 (ref 5.0–7.5)

## 2022-08-01 NOTE — Progress Notes (Addendum)
Patient presents today with complaints of  UTI. Per patient requested UA and Culture done today.  Dr. Annabell Howells reviewed results and no treatment needed .  Patient aware of MD recommendations and that we will reach out with culture results.       [id number], CMA

## 2022-08-01 NOTE — Telephone Encounter (Signed)
Tried calling patient with no answer, left voiced message for return call. 

## 2022-08-01 NOTE — Telephone Encounter (Signed)
Patient has DSMO treatment on 6/19 she is concerned with a possible UTI. She requested to drop off a specimen. She was unable to come last Friday due to another appointment.

## 2022-08-02 ENCOUNTER — Other Ambulatory Visit: Payer: Self-pay | Admitting: Urology

## 2022-08-02 ENCOUNTER — Telehealth: Payer: Self-pay

## 2022-08-02 DIAGNOSIS — N3010 Interstitial cystitis (chronic) without hematuria: Secondary | ICD-10-CM

## 2022-08-02 NOTE — Telephone Encounter (Signed)
Patient is out and needing a refill of: phenazopyridine (PYRIDIUM) 100 MG tablet   Pharmacy: [id number] - [place], [place] - [address] AT [facility] Phone: [phone]  Fax: [phone]

## 2022-08-03 LAB — URINE CULTURE

## 2022-08-07 ENCOUNTER — Other Ambulatory Visit: Payer: Self-pay

## 2022-08-07 ENCOUNTER — Ambulatory Visit (INDEPENDENT_AMBULATORY_CARE_PROVIDER_SITE_OTHER): Payer: Medicare Other

## 2022-08-07 DIAGNOSIS — N3010 Interstitial cystitis (chronic) without hematuria: Secondary | ICD-10-CM

## 2022-08-07 MED ORDER — DIMETHYL SULFOXIDE 50 % IS SOLN
50.0000 mL | Freq: Once | INTRAVESICAL | Status: AC
Start: 2022-08-07 — End: 2022-08-07
  Administered 2022-08-07: 50 mL via URETHRAL

## 2022-08-07 MED ORDER — LIDOCAINE HCL (PF) 2 % IJ SOLN
10.0000 mL | Freq: Once | INTRAMUSCULAR | Status: DC
Start: 2022-08-07 — End: 2022-08-07

## 2022-08-07 MED ORDER — HYDROCORTISONE SOD SUC (PF) 100 MG IJ SOLR
100.0000 mg | Freq: Once | INTRAMUSCULAR | Status: AC
Start: 2022-08-07 — End: 2022-08-07
  Administered 2022-08-07: 100 mg

## 2022-08-07 MED ORDER — LIDOCAINE HCL 1 % IJ SOLN
10.0000 mL | Freq: Once | INTRAMUSCULAR | Status: DC
Start: 2022-08-07 — End: 2022-08-07

## 2022-08-07 MED ORDER — LIDOCAINE HCL URETHRAL/MUCOSAL 2 % EX GEL
1.0000 | Freq: Once | CUTANEOUS | Status: DC
Start: 2022-08-07 — End: 2022-08-07

## 2022-08-07 MED ORDER — SODIUM BICARBONATE 8.4 % IV SOLN
10.0000 mL | Freq: Once | INTRAVENOUS | Status: AC
Start: 2022-08-07 — End: 2022-08-07
  Administered 2022-08-07: 10 mL via INTRAVENOUS

## 2022-08-07 NOTE — Progress Notes (Signed)
Bladder Instillation DMSO  Due to Interstitial cystitis  patient is present today for a Bladder Instillation of DMSO treatment. Patient was cleaned and prepped in a sterile fashion with betadine.  A 20FR catheter was inserted, urine return was noted , urine was yellow in color.  DMSO treatment was instilled into the bladder. The catheter was then removed. Patient tolerated well, no complications were noted pt was instructed to hold the instillation for 20 minutes and then will void it out. Pt voiced understanding.    Preformed by: Cira Rue, RMA/Bernetta Sutley, CMA  Follow up/ Additional notes: keep follow up

## 2022-08-08 ENCOUNTER — Ambulatory Visit: Payer: Medicare Other

## 2022-08-08 DIAGNOSIS — Z23 Encounter for immunization: Secondary | ICD-10-CM

## 2022-08-08 MED ORDER — LIDOCAINE HCL (PF) 2 % IJ SOLN
50.0000 mL | Freq: Once | INTRAMUSCULAR | Status: DC
Start: 2022-08-08 — End: 2022-09-11

## 2022-08-08 MED ORDER — LIDOCAINE HCL 2 % IJ SOLN
60.0000 mL | Freq: Once | INTRAMUSCULAR | Status: AC
Start: 2022-08-08 — End: 2022-08-08
  Administered 2022-08-08: 1200 mg

## 2022-08-08 NOTE — Addendum Note (Signed)
Addended by: Christoper Fabian R on: 08/08/2022 03:31 PM   Modules accepted: Orders, Level of Service

## 2022-08-12 ENCOUNTER — Telehealth: Payer: Self-pay

## 2022-08-12 NOTE — Telephone Encounter (Signed)
Please let her know I will address this when I see her at office visit and we will have plenty of time to discuss and come up with a plan.

## 2022-08-12 NOTE — Telephone Encounter (Signed)
Pt is scheduled for a appt tomorrow to address her oozing. Now pt wants to speak with you regarding her urinating at the same time with even not taking her benefiber. Pt not sure if her banding tomorrow will interfere with a procedure with the urologist she has Wednesday. I could not give her advice on her procedure on Wednesday and she is not sure a banding will/will not help her or interfere. Pt wants to speak with you personally she states regarding this situation (her words).

## 2022-08-12 NOTE — Telephone Encounter (Signed)
Phoned and advised the pt of the recommendations for tomorrow. Pt expressed understanding and agreed

## 2022-08-13 ENCOUNTER — Encounter: Payer: Self-pay | Admitting: Gastroenterology

## 2022-08-13 ENCOUNTER — Ambulatory Visit (INDEPENDENT_AMBULATORY_CARE_PROVIDER_SITE_OTHER): Payer: Medicare Other | Admitting: Gastroenterology

## 2022-08-13 VITALS — BP 129/68 | HR 77 | Temp 97.5°F | Ht 64.0 in | Wt 141.6 lb

## 2022-08-13 DIAGNOSIS — K642 Third degree hemorrhoids: Secondary | ICD-10-CM

## 2022-08-13 NOTE — Patient Instructions (Signed)
We will see you in 3 months!   Please call us if further seepage!   Have a wonderful [birth year] in [birth year]!  I enjoyed seeing you again today! I value our relationship and want to provide genuine, compassionate, and quality care. You may receive a survey regarding your visit with me, and I welcome your feedback! Thanks so much for taking the time to complete this. I look forward to seeing you again.      Gelene Mink, PhD, ANP-BC [facility]

## 2022-08-13 NOTE — Progress Notes (Signed)
    [facility] BANDING PROCEDURE NOTE  Deborah Gray is an 89 y.o. female presenting today for consideration of hemorrhoid banding. Last colonoscopy  2018 with diverticulosis. Prior banding in March/May 2021 by Dr. Jena Gauss with left lateral and right anterior. She has had neutral banding last year. Anoscopy in May 2024 with Grade 3 in right anterior predominantly. She has had banding of right anterior and left lateral. Patient scheduled for cystoscopy tomorrow. Has bladder spasms. Every time has to urinate, has to have a BM. Feels like she has to have a BM all the time. Has persistent fecal seepage. When stopped fiber, seemed to slow the seepage down Saturday and Sunday.    The patient presents with symptomatic grade 3 hemorrhoids, unresponsive to maximal medical therapy, requesting rubber band ligation of her hemorrhoidal disease. All risks, benefits, and alternative forms of therapy were described and informed consent was obtained.  The decision was made to band neutrally, and the [facility] System was used to perform band ligation without complication. Digital anorectal examination was then performed to assure proper positioning of the band, and to adjust the banded tissue as required. The patient was discharged home without pain or other issues. Dietary and behavioral recommendations were given, along with follow-up instructions. The patient will return in 3 months.   No complications were encountered and the patient tolerated the procedure well.   Gelene Mink, PhD, ANP-BC [facility]

## 2022-08-14 ENCOUNTER — Ambulatory Visit (INDEPENDENT_AMBULATORY_CARE_PROVIDER_SITE_OTHER): Payer: Medicare Other | Admitting: Urology

## 2022-08-14 VITALS — BP 160/81 | HR 83

## 2022-08-14 DIAGNOSIS — R3989 Other symptoms and signs involving the genitourinary system: Secondary | ICD-10-CM | POA: Diagnosis not present

## 2022-08-14 DIAGNOSIS — N3010 Interstitial cystitis (chronic) without hematuria: Secondary | ICD-10-CM | POA: Diagnosis not present

## 2022-08-14 LAB — URINALYSIS, ROUTINE W REFLEX MICROSCOPIC
Bilirubin, UA: NEGATIVE
Glucose, UA: NEGATIVE
Ketones, UA: NEGATIVE
Nitrite, UA: NEGATIVE
Protein,UA: NEGATIVE
RBC, UA: NEGATIVE
Specific Gravity, UA: 1.015 (ref 1.005–1.030)
Urobilinogen, Ur: 0.2 mg/dL (ref 0.2–1.0)
pH, UA: 6 (ref 5.0–7.5)

## 2022-08-14 LAB — MICROSCOPIC EXAMINATION: Bacteria, UA: NONE SEEN

## 2022-08-14 MED ORDER — CIPROFLOXACIN HCL 500 MG PO TABS
500.0000 mg | ORAL_TABLET | Freq: Once | ORAL | Status: DC
Start: 2022-08-14 — End: 2022-10-22

## 2022-08-14 MED ORDER — ALFUZOSIN HCL ER 10 MG PO TB24
10.0000 mg | ORAL_TABLET | Freq: Every day | ORAL | 0 refills | Status: DC
Start: 2022-08-14 — End: 2022-09-10

## 2022-08-18 ENCOUNTER — Encounter: Payer: Self-pay | Admitting: Urology

## 2022-08-18 NOTE — Progress Notes (Signed)
   08/14/2022  CC: supapubic pain   HPI: [name] is a 88yo here for cystoscopy for worsening bladder pain Blood pressure (!) 160/81, pulse 83. NED. A&Ox3.   No respiratory distress   Abd soft, NT, ND Normal external genitalia with patent urethral meatus  Cystoscopy Procedure Note  Patient identification was confirmed, informed consent was obtained, and patient was prepped using Betadine solution.  Lidocaine jelly was administered per urethral meatus.    Procedure: - Flexible cystoscope introduced, without any difficulty.   - Thorough search of the bladder revealed:    normal urethral meatus    normal urothelium    no stones    no ulcers     no tumors    no urethral polyps    no trabeculation  - Ureteral orifices were normal in position and appearance.  Post-Procedure: - Patient tolerated the procedure well  Assessment/ Plan: Uroxatral 10mg  qhs   No follow-ups on file.  Wilkie Aye, MD

## 2022-08-20 DIAGNOSIS — H34832 Tributary (branch) retinal vein occlusion, left eye, with macular edema: Secondary | ICD-10-CM | POA: Diagnosis not present

## 2022-08-27 ENCOUNTER — Encounter: Payer: Medicare Other | Admitting: Gastroenterology

## 2022-09-04 ENCOUNTER — Telehealth: Payer: Self-pay

## 2022-09-04 NOTE — Telephone Encounter (Signed)
Error

## 2022-09-04 NOTE — Telephone Encounter (Signed)
Yes- he chiropractor Dr Magnus Sinning mentioned it

## 2022-09-04 NOTE — Telephone Encounter (Signed)
Tommie Sams, DO     Is she referring to stem wave therapy?

## 2022-09-04 NOTE — Telephone Encounter (Signed)
PT is calling wanted to ask if Dr Adriana Simas has heard of stem wave? Pt is concerned when  Dr Tobi Bastos put ger on this medication Pt is wanting to make sure this medication is ok and any advise to be given on ths mediatiom  [place] -[phone]

## 2022-09-05 ENCOUNTER — Other Ambulatory Visit: Payer: Self-pay | Admitting: Urology

## 2022-09-06 NOTE — Telephone Encounter (Signed)
Called pt no answer, left message for callback. Nurse Note: Therapy is Safe per Dr Adriana Simas

## 2022-09-06 NOTE — Telephone Encounter (Signed)
Tommie Sams, DO     That therapy is safe.

## 2022-09-10 ENCOUNTER — Ambulatory Visit: Payer: Medicare Other

## 2022-09-11 ENCOUNTER — Ambulatory Visit (INDEPENDENT_AMBULATORY_CARE_PROVIDER_SITE_OTHER): Payer: Medicare Other

## 2022-09-11 DIAGNOSIS — N3010 Interstitial cystitis (chronic) without hematuria: Secondary | ICD-10-CM

## 2022-09-11 MED ORDER — HYDROCORTISONE SOD SUC (PF) 100 MG IJ SOLR
100.0000 mg | Freq: Once | INTRAMUSCULAR | Status: AC
Start: 2022-09-11 — End: 2022-09-11
  Administered 2022-09-11: 100 mg

## 2022-09-11 MED ORDER — SODIUM BICARBONATE 8.4 % IV SOLN
50.0000 meq | Freq: Once | INTRAVENOUS | Status: AC
Start: 2022-09-11 — End: 2022-09-11
  Administered 2022-09-11: 50 meq

## 2022-09-11 MED ORDER — LIDOCAINE HCL 2 % IJ SOLN
10.0000 mL | Freq: Once | INTRAMUSCULAR | Status: AC
Start: 2022-09-11 — End: 2022-09-11
  Administered 2022-09-11: 200 mg

## 2022-09-11 MED ORDER — DIMETHYL SULFOXIDE 50 % IS SOLN
50.0000 mL | Freq: Once | INTRAVESICAL | Status: AC
Start: 2022-09-11 — End: 2022-09-11
  Administered 2022-09-11: 50 mL via URETHRAL

## 2022-09-11 NOTE — Progress Notes (Signed)
Bladder Instillation DMSO  Due to Interstitial cystitis  patient is present today for a Bladder Instillation of DMSO treatment. Patient was cleaned and prepped in a sterile fashion with betadine.  A 20 FR catheter was inserted, urine return was noted 100 ml, urine was yellow in color.  DMSO treatment was instilled into the bladder. The catheter was then removed. Patient tolerated well, no complications were noted pt was instructed to hold the instillation for 20 minutes and then will void it out. Pt voiced understanding.    Preformed by: Kennyth Lose, CMA  Follow up/ Additional notes: keep scheduled follow up

## 2022-09-12 ENCOUNTER — Telehealth: Payer: Self-pay

## 2022-09-12 NOTE — Telephone Encounter (Signed)
Patient called to see if she was supposed to take the uroxatral 10mg , the pharmacy called her with a new rx.  Informed patient MD sent a rx for 90 tablets with 1 refill on 07/23.  I informed patent to continue taking until her follow up with NP on 08/15.  Patient voiced understanding.

## 2022-09-16 ENCOUNTER — Telehealth: Payer: Self-pay

## 2022-09-16 ENCOUNTER — Other Ambulatory Visit: Payer: Self-pay | Admitting: Urology

## 2022-09-16 DIAGNOSIS — N3010 Interstitial cystitis (chronic) without hematuria: Secondary | ICD-10-CM

## 2022-09-16 NOTE — Telephone Encounter (Signed)
Spoke with patient and answered medication questions and patient voiced understanding.

## 2022-09-16 NOTE — Telephone Encounter (Signed)
Patient left a voice message.  Having spasms. Needing to speak with nurse about 2 medications prescribed.   Please advise as soon as possible.

## 2022-09-17 DIAGNOSIS — H34832 Tributary (branch) retinal vein occlusion, left eye, with macular edema: Secondary | ICD-10-CM | POA: Diagnosis not present

## 2022-09-24 ENCOUNTER — Ambulatory Visit: Payer: Medicare Other | Admitting: Urology

## 2022-09-25 DIAGNOSIS — J310 Chronic rhinitis: Secondary | ICD-10-CM | POA: Diagnosis not present

## 2022-09-25 DIAGNOSIS — R0982 Postnasal drip: Secondary | ICD-10-CM | POA: Diagnosis not present

## 2022-09-25 DIAGNOSIS — J342 Deviated nasal septum: Secondary | ICD-10-CM | POA: Diagnosis not present

## 2022-09-25 DIAGNOSIS — H903 Sensorineural hearing loss, bilateral: Secondary | ICD-10-CM | POA: Diagnosis not present

## 2022-09-25 DIAGNOSIS — H6121 Impacted cerumen, right ear: Secondary | ICD-10-CM | POA: Diagnosis not present

## 2022-09-25 DIAGNOSIS — J343 Hypertrophy of nasal turbinates: Secondary | ICD-10-CM | POA: Diagnosis not present

## 2022-09-27 ENCOUNTER — Other Ambulatory Visit: Payer: Self-pay | Admitting: Student

## 2022-10-03 ENCOUNTER — Ambulatory Visit: Payer: Medicare Other | Admitting: Urology

## 2022-10-03 DIAGNOSIS — E1142 Type 2 diabetes mellitus with diabetic polyneuropathy: Secondary | ICD-10-CM | POA: Diagnosis not present

## 2022-10-03 DIAGNOSIS — B351 Tinea unguium: Secondary | ICD-10-CM | POA: Diagnosis not present

## 2022-10-03 DIAGNOSIS — L84 Corns and callosities: Secondary | ICD-10-CM | POA: Diagnosis not present

## 2022-10-03 DIAGNOSIS — M79673 Pain in unspecified foot: Secondary | ICD-10-CM | POA: Diagnosis not present

## 2022-10-09 ENCOUNTER — Ambulatory Visit (INDEPENDENT_AMBULATORY_CARE_PROVIDER_SITE_OTHER): Payer: Medicare Other

## 2022-10-09 DIAGNOSIS — N200 Calculus of kidney: Secondary | ICD-10-CM

## 2022-10-09 DIAGNOSIS — N3010 Interstitial cystitis (chronic) without hematuria: Secondary | ICD-10-CM

## 2022-10-09 MED ORDER — SODIUM BICARBONATE 8.4 % IV SOLN
50.0000 meq | Freq: Once | INTRAVENOUS | Status: AC
Start: 2022-10-09 — End: 2022-10-09
  Administered 2022-10-09: 50 meq

## 2022-10-09 MED ORDER — LIDOCAINE HCL 2 % IJ SOLN
10.0000 mL | Freq: Once | INTRAMUSCULAR | Status: AC
Start: 2022-10-09 — End: 2022-10-09
  Administered 2022-10-09: 200 mg

## 2022-10-09 MED ORDER — DIMETHYL SULFOXIDE 50 % IS SOLN
50.0000 mL | Freq: Once | INTRAVESICAL | Status: AC
Start: 2022-10-09 — End: 2022-10-09
  Administered 2022-10-09: 50 mL via URETHRAL

## 2022-10-09 MED ORDER — HYDROCORTISONE SOD SUC (PF) 100 MG IJ SOLR
100.0000 mg | Freq: Once | INTRAMUSCULAR | Status: AC
Start: 2022-10-09 — End: 2022-10-09
  Administered 2022-10-09: 100 mg

## 2022-10-09 NOTE — Progress Notes (Signed)
Bladder Instillation DMSO  Due to Interstitial cystitis  patient is present today for a Bladder Instillation of DMSO treatment. Patient was cleaned and prepped in a sterile fashion with betadine.  A 20FR catheter was inserted, urine return was noted , urine was yellow in color.  DMSO treatment was instilled into the bladder. The catheter was then removed. Patient tolerated well, no complications were noted pt was instructed to hold the instillation for 20 minutes and then will void it out. Pt voiced understanding.    Preformed by: Kennyth Lose, CMA  Follow up/ Additional notes: [id number], CMA

## 2022-10-09 NOTE — Addendum Note (Signed)
Addended by: Christoper Fabian R on: 10/09/2022 04:38 PM   Modules accepted: Orders

## 2022-10-10 ENCOUNTER — Ambulatory Visit (HOSPITAL_COMMUNITY)
Admission: RE | Admit: 2022-10-10 | Discharge: 2022-10-10 | Disposition: A | Discharge status: Home / Self Care | Payer: Medicare Other | Source: Ambulatory Visit | Attending: Urology | Admitting: Urology

## 2022-10-10 DIAGNOSIS — N200 Calculus of kidney: Secondary | ICD-10-CM | POA: Diagnosis not present

## 2022-10-10 DIAGNOSIS — N3010 Interstitial cystitis (chronic) without hematuria: Secondary | ICD-10-CM | POA: Insufficient documentation

## 2022-10-10 DIAGNOSIS — K5900 Constipation, unspecified: Secondary | ICD-10-CM | POA: Diagnosis not present

## 2022-10-10 DIAGNOSIS — R109 Unspecified abdominal pain: Secondary | ICD-10-CM | POA: Diagnosis not present

## 2022-10-10 LAB — URINALYSIS, ROUTINE W REFLEX MICROSCOPIC
Bilirubin, UA: NEGATIVE
Glucose, UA: NEGATIVE
Ketones, UA: NEGATIVE
Nitrite, UA: NEGATIVE
Protein,UA: NEGATIVE
RBC, UA: NEGATIVE
Specific Gravity, UA: 1.02 (ref 1.005–1.030)
Urobilinogen, Ur: 0.2 mg/dL (ref 0.2–1.0)
pH, UA: 6 (ref 5.0–7.5)

## 2022-10-10 LAB — MICROSCOPIC EXAMINATION: Bacteria, UA: NONE SEEN

## 2022-10-15 ENCOUNTER — Telehealth: Payer: Self-pay | Admitting: Urology

## 2022-10-15 DIAGNOSIS — H34832 Tributary (branch) retinal vein occlusion, left eye, with macular edema: Secondary | ICD-10-CM | POA: Diagnosis not present

## 2022-10-15 NOTE — Telephone Encounter (Signed)
Message left to return call to office.

## 2022-10-15 NOTE — Telephone Encounter (Signed)
Patient called she is still having a lot of pain in her back, did you get the results from her xray, she had it done last Thursday .

## 2022-10-16 NOTE — Telephone Encounter (Signed)
Message left stating per Dr. Ronne Binning no stones was seen on [facility]. Requested call back to office.

## 2022-10-16 NOTE — Telephone Encounter (Signed)
Patient called again for xray results she will be home until 145pm

## 2022-10-17 NOTE — Telephone Encounter (Signed)
Patient called back she got Hope's message, thank you for taking the time to call her back and give her the results.

## 2022-10-21 NOTE — H&P (View-Only) (Signed)
GI Office Note    Referring Provider: Tommie Sams, DO Primary Care Physician:  Tommie Sams, DO  Primary Gastroenterologist: Roetta Sessions, MD   Chief Complaint   Chief Complaint  Patient presents with   Fecal Smearing    Has been having issues with fecal smearing since having her hemorrhoids banded.     History of Present Illness   Deborah Gray is a 89 y.o. female presenting today for fecal smearing. She has history of GERD, esophageal spasms/nutcracker esophagus, idiopathic chronic pancreatitis with chronic pancreatic exocrine insufficiency, IBS with intermittent diarrhea, hemorrhoids. She tries to avoid PPIs due to concerns for causing memory issues. After last EGD, has been on aciphex 20mg  daily. Issues with abdominal pain and concern for bowel obstruction on CT in 02/2022, advised to hold off Levsin and imodium at that time. Had noted increased use to control bowels around the holidays. F/u CT looked good. Suggested she continue levsin prn but avoid imoidum use.   Last seen in 07/2022 for hemorrhoid banding. Band placed neutrally. She had left lateral internal hemorrhoid banding and right anterior internal hemorrhoid banding in 06/2022.   Today: presents with complains of fecal incontinence. She states symptoms present since recent hemorrhoid banding. She states she was put back on benefiber while having her banding but she has since quit. She started herself back on [facility]. She felt like Benfiber was making it harder to clean up after BMs. Stools more messy. In the past she was taken off Benefiber due to diarrhea. She also notes that she saw scant amount of pink blood per rectum for several weeks to couple of months after her banding.   She is now having incomplete stools. She states her stools are Bristol 1. After having a BM, she will start to wipe but then start oozing more balls of stool. She is folding toilet paper and placing in her panty liner to  catch the stool. She is rarely taking Levsin. She is requesting refill of her Librax today, rarely uses, used 70 in past one year, she uses this for esophageal issues.    EGD 12/2021: -Distal esophageal rings,2 tandem. Overlying distal esophageal erosion consistent with erosive reflux esophagitis. Status post dilation.  -Status post esophageal biopsy as described -Inflamed appearing stomach of uncertain significance diffusely status post biopsy normal duodenal bulb and second portion of the duodenum. -Patient not on any meaningful acid suppression therapy. She takes pancreatic enzymes as well. Concerns about dementia risk with PPIs previously. In this setting, the benefits of acid suppression therapy with a PPI far outweighs any theoretical risks of dementia.  Colonoscopy 03/2016: -diverticulosis  Medications   Current Outpatient Medications  Medication Sig Dispense Refill   acetaminophen (TYLENOL) 500 MG tablet Take 500 mg by mouth at bedtime.     alfuzosin (UROXATRAL) 10 MG 24 hr tablet TAKE 1 TABLET (10 MG TOTAL) BY MOUTH DAILY WITH BREAKFAST. 90 tablet 1   ascorbic acid (VITAMIN C) 500 MG tablet Take 500 mg by mouth daily.     aspirin EC 81 MG tablet Take 81 mg by mouth at bedtime.     Carboxymethylcellulose Sodium (THERATEARS) 0.25 % SOLN Place 1 drop into both eyes 2 (two) times daily.     cholecalciferol (VITAMIN D3) 25 MCG (1000 UNIT) tablet Take 1,000 Units by mouth at bedtime.     clidinium-chlordiazePOXIDE (LIBRAX) 5-2.5 MG capsule TAKE 1 CAPSULE BY MOUTH 3 (THREE) TIMES DAILY AS NEEDED (FOR ESOPHAGEAL SYMPTOMS).  270 capsule 0   ezetimibe (ZETIA) 10 MG tablet TAKE 1 TABLET BY MOUTH EVERY DAY 90 tablet 1   Guaifenesin 1200 MG TB12 Take 1,200 mg by mouth daily with supper.     ipratropium (ATROVENT) 0.06 % nasal spray SMARTSIG:2 Spray(s) Both Nares Twice Daily PRN     losartan (COZAAR) 50 MG tablet TAKE 1 TABLET BY MOUTH EVERY DAY 90 tablet 3   meclizine (ANTIVERT) 25 MG tablet  Take 1 tablet (25 mg total) by mouth 3 (three) times daily as needed. 30 tablet 1   metFORMIN (GLUCOPHAGE) 500 MG tablet TAKE ONE TABLET BY MOUTH EVERY MORNING ONE AT LUNCH AND 2 AT BEDTIME 360 tablet 1   methenamine (HIPREX) 1 g tablet TAKE 1 TABLET BY MOUTH TWICE A DAY 180 tablet 3   Multiple Vitamins-Minerals (PRESERVISION AREDS 2) CAPS Take 1 capsule by mouth 2 (two) times daily.      MYRBETRIQ 50 MG TB24 tablet TAKE 1 TABLET BY MOUTH EVERY DAY 90 tablet 3   nateglinide (STARLIX) 60 MG tablet Take 60 mg by mouth 2 (two) times daily with a meal.     omega-3 acid ethyl esters (LOVAZA) 1 g capsule Take 2 g by mouth every morning.     Pancrelipase, Lip-Prot-Amyl, (CREON) 24000-76000 units CPEP TAKE 3 CAPSULES THREE TIMES DAILY WITH MEALS AND ONE CAPSULE WITH SNACK TWICE DAILY 1000 capsule 5   RABEprazole (ACIPHEX) 20 MG tablet Take 1 tablet (20 mg total) by mouth daily. 30 tablet 11   hyoscyamine (LEVSIN SL) 0.125 MG SL tablet USE 1 TABLET UNDER THE TONGUE BEFORE MEALS AND AT BEDTIME AS NEEDED FOR FLARES IN SYMPTOMS, DIARRHEA/ABDOMINAL CRAMPING (Patient not taking: Reported on 10/22/2022) 270 tablet 1   No current facility-administered medications for this visit.    Allergies   Allergies as of 10/22/2022 - Review Complete 10/22/2022  Allergen Reaction Noted   Adhesive [tape] Other (See Comments) 07/21/2016   Estrogens Other (See Comments) 10/15/2012   Sulfonamide derivatives Rash 03/01/2008     Past Medical History   Past Medical History:  Diagnosis Date   Arthritis    Benign neoplasm of colon    Constipation    Diaphragmatic hernia without mention of obstruction or gangrene    Diverticulosis of colon (without mention of hemorrhage)    Dysphagia, pharyngoesophageal phase    Esophageal reflux    Essential hypertension    Gastritis    Heart murmur    Hemorrhoids    Hyperlipidemia    Internal hemorrhoids without mention of complication    Interstitial cystitis    Left wrist  fracture    PONV (postoperative nausea and vomiting)    PSVT (paroxysmal supraventricular tachycardia)    Stricture and stenosis of esophagus    Type 2 diabetes mellitus (HCC)     Past Surgical History   Past Surgical History:  Procedure Laterality Date   ABDOMINAL HYSTERECTOMY     BACTERIAL OVERGROWTH TEST N/A 09/06/2015   Procedure: BACTERIAL OVERGROWTH TEST;  Surgeon: Corbin Ade, MD;  [facility]: [facility];  Service: Endoscopy;  Laterality: N/A;  800   BIOPSY  01/02/2022   Procedure: BIOPSY;  Surgeon: Corbin Ade, MD;  Location: [facility];  Service: Endoscopy;;   BREAST LUMPECTOMY Right    benign   CATARACT EXTRACTION Bilateral 2006   Implants in both, surgeries done 6 weeks apart   CHOLECYSTECTOMY     COLONOSCOPY  03/08/02   Jarold Motto: diverticulosis, internal hemorrhoids, adenomatous colon polyp  COLONOSCOPY  01/23/04   Patterson: diverticulosis, internal hemorrhoids   COLONOSCOPY  09/25/05   Jarold Motto: diverticulosis   COLONOSCOPY  07/05/09   Jarold Motto: severe diverticulosis   COLONOSCOPY  01/21/11   Jarold Motto: severe diverticulosis in sigmoid to desc colon, int hemorrhoids, follow up TCS in 5 years   COLONOSCOPY N/A 04/10/2016   [name]: diverticulosis   CYSTOSCOPY W/ URETERAL STENT PLACEMENT Right 03/15/2021   Procedure: CYSTOSCOPY WITH RETROGRADE PYELOGRAM/URETERAL STENT PLACEMENT;  Surgeon: Jannifer Hick, MD;  Location: [facility];  Service: Urology;  Laterality: Right;   CYSTOSCOPY WITH RETROGRADE PYELOGRAM, URETEROSCOPY AND STENT PLACEMENT Right 03/29/2021   Procedure: CYSTOSCOPY WITH RETROGRADE PYELOGRAM, URETEROSCOPY AND STENT EXCHANGE;  Surgeon: Malen Gauze, MD;  Location: [facility];  Service: Urology;  Laterality: Right;   DILATION AND CURETTAGE OF UTERUS     ESOPHAGEAL MANOMETRY  03/21/08   Patterson: findings c/w Nutcracker Esophagus   ESOPHAGOGASTRODUODENOSCOPY  03/08/02   Jarold Motto: esophageal stricture, chronic gerd, s/p dilation.     ESOPHAGOGASTRODUODENOSCOPY  01/23/04   Jarold Motto: esophageal stricture, gastritis, hiatal hernia   ESOPHAGOGASTRODUODENOSCOPY  09/25/05   Jarold Motto: gastritis, benign bx, no H.pylori   ESOPHAGOGASTRODUODENOSCOPY  07/05/09   Jarold Motto: gastropathy, benign small bowel bx and gastric bx   ESOPHAGOGASTRODUODENOSCOPY N/A 05/15/2015   Dr. Jena Gauss- diffuse moderate inflammation characterized by congestion (edema), erythema, and linear erosions was found in the entire examined stomach. bx= reactive gastropathy   ESOPHAGOGASTRODUODENOSCOPY (EGD) WITH PROPOFOL N/A 01/02/2022   Procedure: ESOPHAGOGASTRODUODENOSCOPY (EGD) WITH PROPOFOL;  Surgeon: Corbin Ade, MD;  [facility];  Service: Endoscopy;  Laterality: N/A;  10:30am, asa 2   FOOT SURGERY Left    HEMORRHOID BANDING  2017   Dr.[name]   HOLMIUM LASER APPLICATION Right 03/29/2021   Procedure: HOLMIUM LASER APPLICATION;  Surgeon: Malen Gauze, MD;  Location: [facility];  Service: Urology;  Laterality: Right;   LAPAROSCOPIC VAGINAL HYSTERECTOMY     MALONEY DILATION N/A 01/02/2022   Procedure: Elease Hashimoto DILATION;  Surgeon: Corbin Ade, MD;  [facility]: [facility];  Service: Endoscopy;  Laterality: N/A;   ORIF WRIST FRACTURE Left 06/23/2018   Procedure: OPEN REDUCTION INTERNAL FIXATION (ORIF) LEFT WRIST FRACTURE;  Surgeon: Sheral Apley, MD;  Location: [facility];  Service: Orthopedics;  Laterality: Left;  regional arm block   RECTOCELE REPAIR     TOTAL KNEE ARTHROPLASTY Right     Past Family History   Family History  Problem Relation Age of Onset   Ovarian cancer Mother    Diabetes Father    Heart disease Father    Breast cancer Sister    Liver cancer Sister    Colon cancer Cousin        Paternal side   Diabetes Maternal Aunt    Liver disease Cousin        Maternal side, never drank    Past Social History   Social History   Socioeconomic History   Marital status: Widowed    Spouse name: Not on file    Number of children: 3   Years of education: Not on file   Highest education level: Not on file  Occupational History   Occupation: Retired  Tobacco Use   Smoking status: Never   Smokeless tobacco: Never   Tobacco comments:    Never smoked  Vaping Use   Vaping status: Never Used  Substance and Sexual Activity   Alcohol use: No    Alcohol/week: 0.0 standard drinks of alcohol  Drug use: No   Sexual activity: Not Currently  Other Topics Concern   Not on file  Social History Narrative   Widowed since 07/2018. Married x 61 years.   3 sons. 22, 1963 and 1966.   Social Determinants of Health   Financial Resource Strain: Low Risk  (05/31/2022)   Overall Financial Resource Strain (CARDIA)    Difficulty of Paying Living Expenses: Not hard at all  Food Insecurity: No Food Insecurity (05/31/2022)   Hunger Vital Sign    Worried About Running Out of Food in the Last Year: Never true    Ran Out of Food in the Last Year: Never true  Transportation Needs: No Transportation Needs (05/31/2022)   PRAPARE - Administrator, Civil Service (Medical): No    Lack of Transportation (Non-Medical): No  Physical Activity: Sufficiently Active (05/31/2022)   Exercise Vital Sign    Days of Exercise per Week: 5 days    Minutes of Exercise per Session: 30 min  Stress: No Stress Concern Present (05/31/2022)   [employer] of Occupational Health - Occupational Stress Questionnaire    Feeling of Stress : Not at all  Social Connections: Moderately Integrated (05/31/2022)   Social Connection and Isolation Panel [NHANES]    Frequency of Communication with Friends and Family: More than three times a week    Frequency of Social Gatherings with Friends and Family: More than three times a week    Attends Religious Services: More than 4 times per year    Active Member of [employer] or Organizations: Yes    Attends Banker Meetings: More than 4 times per year    Marital Status: Widowed   Intimate Partner Violence: Not At Risk (05/31/2022)   Humiliation, Afraid, Rape, and Kick questionnaire    Fear of Current or Ex-Partner: No    Emotionally Abused: No    Physically Abused: No    Sexually Abused: No    Review of Systems   General: Negative for anorexia, weight loss, fever, chills, fatigue, weakness. ENT: Negative for hoarseness, difficulty swallowing , nasal congestion. CV: Negative for chest pain, angina, palpitations, dyspnea on exertion, peripheral edema.  Respiratory: Negative for dyspnea at rest, dyspnea on exertion, cough, sputum, wheezing.  GI: See history of present illness. GU:  Negative for dysuria, hematuria, urinary incontinence, urinary frequency, nocturnal urination.  Endo: Negative for unusual weight change.     Physical Exam   BP 137/73 (BP Location: Right Arm, Patient Position: Sitting, Cuff Size: Normal)   Pulse 98   Temp 97.8 F (36.6 C) (Oral)   Ht 5\' 4"  (1.626 m)   Wt 142 lb 6.4 oz (64.6 kg)   SpO2 97%   BMI 24.44 kg/m    General: Well-nourished, well-developed in no acute distress.  Eyes: No icterus. Mouth: Oropharyngeal mucosa moist and pink   Lungs: Clear to auscultation bilaterally.  Heart: Regular rate and rhythm, no murmurs rubs or gallops.  Abdomen: Bowel sounds are normal, nontender, nondistended, no hepatosplenomegaly or masses,  no abdominal bruits or hernia , no rebound or guarding.  Rectal: nontender exam. Anteriorly on DRE questionable polypoid mass palpated vs palpation of pelvic organ Extremities: No lower extremity edema. No clubbing or deformities. Neuro: Alert and oriented x 4   Skin: Warm and dry, no jaundice.   Psych: Alert and cooperative, normal mood and affect.  Labs   Lab Results  Component Value Date   NA 141 04/08/2022   CL 103 04/08/2022  K 5.1 04/08/2022   CO2 24 04/08/2022   BUN 32 (H) 04/08/2022   CREATININE 0.70 04/08/2022   EGFR 84 04/08/2022   CALCIUM 10.4 (H) 04/08/2022   PHOS 2.7  03/16/2021   ALBUMIN 4.8 (H) 04/08/2022   GLUCOSE 139 (H) 04/08/2022   Lab Results  Component Value Date   ALT 16 04/08/2022   AST 19 04/08/2022   ALKPHOS 91 04/08/2022   BILITOT 0.4 04/08/2022     Lab Results  Component Value Date   WBC 6.7 04/08/2022   HGB 13.2 04/08/2022   HCT 39.9 04/08/2022   MCV 87 04/08/2022   PLT 249 04/08/2022    Imaging Studies   DG Abd 1 View  Result Date: 10/17/2022 CLINICAL DATA:  Abdominal pain. EXAM: ABDOMEN - 1 VIEW COMPARISON:  None Available. FINDINGS: The bowel gas pattern is normal without gaseous distention. No radio-opaque calculi or other significant radiographic abnormality are seen. Increased stool noted consistent with constipation. IMPRESSION: Constipation. Electronically Signed   By: Layla Maw M.D.   On: 10/17/2022 15:45    Assessment   *Fecal smearing/incomplete passage of stool *H/O prolapsed grade 3 internal hemorrhoids s/p banding *Rectal bleeding *Possible rectal polyp palpated   Patient had not mentioned previously, after one of her banding sessions, she had significant pain for several days. At this time she is having incontinence of Bristol 1 stools, feels like stools never completely pass, as demonstrated by further stools passing while she tries to clean up. Unaware of passage of Bristol 1 stools throughout the day. DRE with ?palpation polypoid tissue as outlined. Given ongoing bleeding, incontinence, DRE exam would advise direct visualization with flex sig.    PLAN   Add back benefiber but try reduced dose initially 1 teaspoon to 2 teaspoons daily.  Continue probiotics. Continue other GI meds as outlined. Flex sig with Dr. Jena Gauss. ASA 2.  I have discussed the risks, alternatives, benefits with regards to but not limited to the risk of reaction to medication, bleeding, infection, perforation and the patient is agreeable to proceed. Written consent to be obtained.     Leanna Battles. Melvyn Neth, MHS, PA-C [facility]

## 2022-10-21 NOTE — Progress Notes (Signed)
GI Office Note    Referring Provider: Tommie Sams, DO Primary Care Physician:  Tommie Sams, DO  Primary Gastroenterologist: Roetta Sessions, MD   Chief Complaint   Chief Complaint  Patient presents with   Fecal Smearing    Has been having issues with fecal smearing since having her hemorrhoids banded.     History of Present Illness   Deborah Gray is a 89 y.o. female presenting today for fecal smearing. She has history of GERD, esophageal spasms/nutcracker esophagus, idiopathic chronic pancreatitis with chronic pancreatic exocrine insufficiency, IBS with intermittent diarrhea, hemorrhoids. She tries to avoid PPIs due to concerns for causing memory issues. After last EGD, has been on aciphex 20mg  daily. Issues with abdominal pain and concern for bowel obstruction on CT in 02/2022, advised to hold off Levsin and imodium at that time. Had noted increased use to control bowels around the holidays. F/u CT looked good. Suggested she continue levsin prn but avoid imoidum use.   Last seen in 07/2022 for hemorrhoid banding. Band placed neutrally. She had left lateral internal hemorrhoid banding and right anterior internal hemorrhoid banding in 06/2022.   Today: presents with complains of fecal incontinence. She states symptoms present since recent hemorrhoid banding. She states she was put back on benefiber while having her banding but she has since quit. She started herself back on [facility]. She felt like Benfiber was making it harder to clean up after BMs. Stools more messy. In the past she was taken off Benefiber due to diarrhea. She also notes that she saw scant amount of pink blood per rectum for several weeks to couple of months after her banding.   She is now having incomplete stools. She states her stools are Bristol 1. After having a BM, she will start to wipe but then start oozing more balls of stool. She is folding toilet paper and placing in her panty liner to  catch the stool. She is rarely taking Levsin. She is requesting refill of her Librax today, rarely uses, used 70 in past one year, she uses this for esophageal issues.    EGD 12/2021: -Distal esophageal rings,2 tandem. Overlying distal esophageal erosion consistent with erosive reflux esophagitis. Status post dilation.  -Status post esophageal biopsy as described -Inflamed appearing stomach of uncertain significance diffusely status post biopsy normal duodenal bulb and second portion of the duodenum. -Patient not on any meaningful acid suppression therapy. She takes pancreatic enzymes as well. Concerns about dementia risk with PPIs previously. In this setting, the benefits of acid suppression therapy with a PPI far outweighs any theoretical risks of dementia.  Colonoscopy 03/2016: -diverticulosis  Medications   Current Outpatient Medications  Medication Sig Dispense Refill   acetaminophen (TYLENOL) 500 MG tablet Take 500 mg by mouth at bedtime.     alfuzosin (UROXATRAL) 10 MG 24 hr tablet TAKE 1 TABLET (10 MG TOTAL) BY MOUTH DAILY WITH BREAKFAST. 90 tablet 1   ascorbic acid (VITAMIN C) 500 MG tablet Take 500 mg by mouth daily.     aspirin EC 81 MG tablet Take 81 mg by mouth at bedtime.     Carboxymethylcellulose Sodium (THERATEARS) 0.25 % SOLN Place 1 drop into both eyes 2 (two) times daily.     cholecalciferol (VITAMIN D3) 25 MCG (1000 UNIT) tablet Take 1,000 Units by mouth at bedtime.     clidinium-chlordiazePOXIDE (LIBRAX) 5-2.5 MG capsule TAKE 1 CAPSULE BY MOUTH 3 (THREE) TIMES DAILY AS NEEDED (FOR ESOPHAGEAL SYMPTOMS).  270 capsule 0   ezetimibe (ZETIA) 10 MG tablet TAKE 1 TABLET BY MOUTH EVERY DAY 90 tablet 1   Guaifenesin 1200 MG TB12 Take 1,200 mg by mouth daily with supper.     ipratropium (ATROVENT) 0.06 % nasal spray SMARTSIG:2 Spray(s) Both Nares Twice Daily PRN     losartan (COZAAR) 50 MG tablet TAKE 1 TABLET BY MOUTH EVERY DAY 90 tablet 3   meclizine (ANTIVERT) 25 MG tablet  Take 1 tablet (25 mg total) by mouth 3 (three) times daily as needed. 30 tablet 1   metFORMIN (GLUCOPHAGE) 500 MG tablet TAKE ONE TABLET BY MOUTH EVERY MORNING ONE AT LUNCH AND 2 AT BEDTIME 360 tablet 1   methenamine (HIPREX) 1 g tablet TAKE 1 TABLET BY MOUTH TWICE A DAY 180 tablet 3   Multiple Vitamins-Minerals (PRESERVISION AREDS 2) CAPS Take 1 capsule by mouth 2 (two) times daily.      MYRBETRIQ 50 MG TB24 tablet TAKE 1 TABLET BY MOUTH EVERY DAY 90 tablet 3   nateglinide (STARLIX) 60 MG tablet Take 60 mg by mouth 2 (two) times daily with a meal.     omega-3 acid ethyl esters (LOVAZA) 1 g capsule Take 2 g by mouth every morning.     Pancrelipase, Lip-Prot-Amyl, (CREON) 24000-76000 units CPEP TAKE 3 CAPSULES THREE TIMES DAILY WITH MEALS AND ONE CAPSULE WITH SNACK TWICE DAILY 1000 capsule 5   RABEprazole (ACIPHEX) 20 MG tablet Take 1 tablet (20 mg total) by mouth daily. 30 tablet 11   hyoscyamine (LEVSIN SL) 0.125 MG SL tablet USE 1 TABLET UNDER THE TONGUE BEFORE MEALS AND AT BEDTIME AS NEEDED FOR FLARES IN SYMPTOMS, DIARRHEA/ABDOMINAL CRAMPING (Patient not taking: Reported on 10/22/2022) 270 tablet 1   No current facility-administered medications for this visit.    Allergies   Allergies as of 10/22/2022 - Review Complete 10/22/2022  Allergen Reaction Noted   Adhesive [tape] Other (See Comments) 07/21/2016   Estrogens Other (See Comments) 10/15/2012   Sulfonamide derivatives Rash 03/01/2008     Past Medical History   Past Medical History:  Diagnosis Date   Arthritis    Benign neoplasm of colon    Constipation    Diaphragmatic hernia without mention of obstruction or gangrene    Diverticulosis of colon (without mention of hemorrhage)    Dysphagia, pharyngoesophageal phase    Esophageal reflux    Essential hypertension    Gastritis    Heart murmur    Hemorrhoids    Hyperlipidemia    Internal hemorrhoids without mention of complication    Interstitial cystitis    Left wrist  fracture    PONV (postoperative nausea and vomiting)    PSVT (paroxysmal supraventricular tachycardia)    Stricture and stenosis of esophagus    Type 2 diabetes mellitus (HCC)     Past Surgical History   Past Surgical History:  Procedure Laterality Date   ABDOMINAL HYSTERECTOMY     BACTERIAL OVERGROWTH TEST N/A 09/06/2015   Procedure: BACTERIAL OVERGROWTH TEST;  Surgeon: Corbin Ade, MD;  [facility]: [facility];  Service: Endoscopy;  Laterality: N/A;  800   BIOPSY  01/02/2022   Procedure: BIOPSY;  Surgeon: Corbin Ade, MD;  [facility]: [facility];  Service: Endoscopy;;   BREAST LUMPECTOMY Right    benign   CATARACT EXTRACTION Bilateral 2006   Implants in both, surgeries done 6 weeks apart   CHOLECYSTECTOMY     COLONOSCOPY  03/08/02   Jarold Motto: diverticulosis, internal hemorrhoids, adenomatous colon polyp  COLONOSCOPY  01/23/04   Patterson: diverticulosis, internal hemorrhoids   COLONOSCOPY  09/25/05   Jarold Motto: diverticulosis   COLONOSCOPY  07/05/09   Jarold Motto: severe diverticulosis   COLONOSCOPY  01/21/11   Jarold Motto: severe diverticulosis in sigmoid to desc colon, int hemorrhoids, follow up TCS in 5 years   COLONOSCOPY N/A 04/10/2016   [name]: diverticulosis   CYSTOSCOPY W/ URETERAL STENT PLACEMENT Right 03/15/2021   Procedure: CYSTOSCOPY WITH RETROGRADE PYELOGRAM/URETERAL STENT PLACEMENT;  Surgeon: Jannifer Hick, MD;  Location: [facility];  Service: Urology;  Laterality: Right;   CYSTOSCOPY WITH RETROGRADE PYELOGRAM, URETEROSCOPY AND STENT PLACEMENT Right 03/29/2021   Procedure: CYSTOSCOPY WITH RETROGRADE PYELOGRAM, URETEROSCOPY AND STENT EXCHANGE;  Surgeon: Malen Gauze, MD;  Location: [facility];  Service: Urology;  Laterality: Right;   DILATION AND CURETTAGE OF UTERUS     ESOPHAGEAL MANOMETRY  03/21/08   Patterson: findings c/w Nutcracker Esophagus   ESOPHAGOGASTRODUODENOSCOPY  03/08/02   Jarold Motto: esophageal stricture, chronic gerd, s/p dilation.     ESOPHAGOGASTRODUODENOSCOPY  01/23/04   Jarold Motto: esophageal stricture, gastritis, hiatal hernia   ESOPHAGOGASTRODUODENOSCOPY  09/25/05   Jarold Motto: gastritis, benign bx, no H.pylori   ESOPHAGOGASTRODUODENOSCOPY  07/05/09   Jarold Motto: gastropathy, benign small bowel bx and gastric bx   ESOPHAGOGASTRODUODENOSCOPY N/A 05/15/2015   Dr. Jena Gauss- diffuse moderate inflammation characterized by congestion (edema), erythema, and linear erosions was found in the entire examined stomach. bx= reactive gastropathy   ESOPHAGOGASTRODUODENOSCOPY (EGD) WITH PROPOFOL N/A 01/02/2022   Procedure: ESOPHAGOGASTRODUODENOSCOPY (EGD) WITH PROPOFOL;  Surgeon: Corbin Ade, MD;  [facility];  Service: Endoscopy;  Laterality: N/A;  10:30am, asa 2   FOOT SURGERY Left    HEMORRHOID BANDING  2017   Dr.[name]   HOLMIUM LASER APPLICATION Right 03/29/2021   Procedure: HOLMIUM LASER APPLICATION;  Surgeon: Malen Gauze, MD;  Location: [facility];  Service: Urology;  Laterality: Right;   LAPAROSCOPIC VAGINAL HYSTERECTOMY     MALONEY DILATION N/A 01/02/2022   Procedure: Elease Hashimoto DILATION;  Surgeon: Corbin Ade, MD;  [facility];  Service: Endoscopy;  Laterality: N/A;   ORIF WRIST FRACTURE Left 06/23/2018   Procedure: OPEN REDUCTION INTERNAL FIXATION (ORIF) LEFT WRIST FRACTURE;  Surgeon: Sheral Apley, MD;  Location: [facility];  Service: Orthopedics;  Laterality: Left;  regional arm block   RECTOCELE REPAIR     TOTAL KNEE ARTHROPLASTY Right     Past Family History   Family History  Problem Relation Age of Onset   Ovarian cancer Mother    Diabetes Father    Heart disease Father    Breast cancer Sister    Liver cancer Sister    Colon cancer Cousin        Paternal side   Diabetes Maternal Aunt    Liver disease Cousin        Maternal side, never drank    Past Social History   Social History   Socioeconomic History   Marital status: Widowed    Spouse name: Not on file    Number of children: 3   Years of education: Not on file   Highest education level: Not on file  Occupational History   Occupation: Retired  Tobacco Use   Smoking status: Never   Smokeless tobacco: Never   Tobacco comments:    Never smoked  Vaping Use   Vaping status: Never Used  Substance and Sexual Activity   Alcohol use: No    Alcohol/week: 0.0 standard drinks of alcohol  Drug use: No   Sexual activity: Not Currently  Other Topics Concern   Not on file  Social History Narrative   Widowed since 07/2018. Married x 61 years.   3 sons. 22, 1963 and 1966.   Social Determinants of Health   Financial Resource Strain: Low Risk  (05/31/2022)   Overall Financial Resource Strain (CARDIA)    Difficulty of Paying Living Expenses: Not hard at all  Food Insecurity: No Food Insecurity (05/31/2022)   Hunger Vital Sign    Worried About Running Out of Food in the Last Year: Never true    Ran Out of Food in the Last Year: Never true  Transportation Needs: No Transportation Needs (05/31/2022)   PRAPARE - Administrator, Civil Service (Medical): No    Lack of Transportation (Non-Medical): No  Physical Activity: Sufficiently Active (05/31/2022)   Exercise Vital Sign    Days of Exercise per Week: 5 days    Minutes of Exercise per Session: 30 min  Stress: No Stress Concern Present (05/31/2022)   [employer] of Occupational Health - Occupational Stress Questionnaire    Feeling of Stress : Not at all  Social Connections: Moderately Integrated (05/31/2022)   Social Connection and Isolation Panel [NHANES]    Frequency of Communication with Friends and Family: More than three times a week    Frequency of Social Gatherings with Friends and Family: More than three times a week    Attends Religious Services: More than 4 times per year    Active Member of [employer] or Organizations: Yes    Attends Banker Meetings: More than 4 times per year    Marital Status: Widowed   Intimate Partner Violence: Not At Risk (05/31/2022)   Humiliation, Afraid, Rape, and Kick questionnaire    Fear of Current or Ex-Partner: No    Emotionally Abused: No    Physically Abused: No    Sexually Abused: No    Review of Systems   General: Negative for anorexia, weight loss, fever, chills, fatigue, weakness. ENT: Negative for hoarseness, difficulty swallowing , nasal congestion. CV: Negative for chest pain, angina, palpitations, dyspnea on exertion, peripheral edema.  Respiratory: Negative for dyspnea at rest, dyspnea on exertion, cough, sputum, wheezing.  GI: See history of present illness. GU:  Negative for dysuria, hematuria, urinary incontinence, urinary frequency, nocturnal urination.  Endo: Negative for unusual weight change.     Physical Exam   BP 137/73 (BP Location: Right Arm, Patient Position: Sitting, Cuff Size: Normal)   Pulse 98   Temp 97.8 F (36.6 C) (Oral)   Ht 5\' 4"  (1.626 m)   Wt 142 lb 6.4 oz (64.6 kg)   SpO2 97%   BMI 24.44 kg/m    General: Well-nourished, well-developed in no acute distress.  Eyes: No icterus. Mouth: Oropharyngeal mucosa moist and pink   Lungs: Clear to auscultation bilaterally.  Heart: Regular rate and rhythm, no murmurs rubs or gallops.  Abdomen: Bowel sounds are normal, nontender, nondistended, no hepatosplenomegaly or masses,  no abdominal bruits or hernia , no rebound or guarding.  Rectal: nontender exam. Anteriorly on DRE questionable polypoid mass palpated vs palpation of pelvic organ Extremities: No lower extremity edema. No clubbing or deformities. Neuro: Alert and oriented x 4   Skin: Warm and dry, no jaundice.   Psych: Alert and cooperative, normal mood and affect.  Labs   Lab Results  Component Value Date   NA 141 04/08/2022   CL 103 04/08/2022  K 5.1 04/08/2022   CO2 24 04/08/2022   BUN 32 (H) 04/08/2022   CREATININE 0.70 04/08/2022   EGFR 84 04/08/2022   CALCIUM 10.4 (H) 04/08/2022   PHOS 2.7  03/16/2021   ALBUMIN 4.8 (H) 04/08/2022   GLUCOSE 139 (H) 04/08/2022   Lab Results  Component Value Date   ALT 16 04/08/2022   AST 19 04/08/2022   ALKPHOS 91 04/08/2022   BILITOT 0.4 04/08/2022     Lab Results  Component Value Date   WBC 6.7 04/08/2022   HGB 13.2 04/08/2022   HCT 39.9 04/08/2022   MCV 87 04/08/2022   PLT 249 04/08/2022    Imaging Studies   DG Abd 1 View  Result Date: 10/17/2022 CLINICAL DATA:  Abdominal pain. EXAM: ABDOMEN - 1 VIEW COMPARISON:  None Available. FINDINGS: The bowel gas pattern is normal without gaseous distention. No radio-opaque calculi or other significant radiographic abnormality are seen. Increased stool noted consistent with constipation. IMPRESSION: Constipation. Electronically Signed   By: Layla Maw M.D.   On: 10/17/2022 15:45    Assessment   *Fecal smearing/incomplete passage of stool *H/O prolapsed grade 3 internal hemorrhoids s/p banding *Rectal bleeding *Possible rectal polyp palpated   Patient had not mentioned previously, after one of her banding sessions, she had significant pain for several days. At this time she is having incontinence of Bristol 1 stools, feels like stools never completely pass, as demonstrated by further stools passing while she tries to clean up. Unaware of passage of Bristol 1 stools throughout the day. DRE with ?palpation polypoid tissue as outlined. Given ongoing bleeding, incontinence, DRE exam would advise direct visualization with flex sig.    PLAN   Add back benefiber but try reduced dose initially 1 teaspoon to 2 teaspoons daily.  Continue probiotics. Continue other GI meds as outlined. Flex sig with Dr. Jena Gauss. ASA 2.  I have discussed the risks, alternatives, benefits with regards to but not limited to the risk of reaction to medication, bleeding, infection, perforation and the patient is agreeable to proceed. Written consent to be obtained.     Leanna Battles. Melvyn Neth, MHS, PA-C [facility]

## 2022-10-22 ENCOUNTER — Encounter: Payer: Self-pay | Admitting: Gastroenterology

## 2022-10-22 ENCOUNTER — Other Ambulatory Visit: Payer: Self-pay | Admitting: Family Medicine

## 2022-10-22 ENCOUNTER — Ambulatory Visit (INDEPENDENT_AMBULATORY_CARE_PROVIDER_SITE_OTHER): Payer: Medicare Other | Admitting: Gastroenterology

## 2022-10-22 VITALS — BP 137/73 | HR 98 | Temp 97.8°F | Ht 64.0 in | Wt 142.4 lb

## 2022-10-22 DIAGNOSIS — K642 Third degree hemorrhoids: Secondary | ICD-10-CM

## 2022-10-22 DIAGNOSIS — R150 Incomplete defecation: Secondary | ICD-10-CM | POA: Insufficient documentation

## 2022-10-22 DIAGNOSIS — K621 Rectal polyp: Secondary | ICD-10-CM | POA: Insufficient documentation

## 2022-10-22 DIAGNOSIS — K625 Hemorrhage of anus and rectum: Secondary | ICD-10-CM | POA: Diagnosis not present

## 2022-10-22 DIAGNOSIS — R151 Fecal smearing: Secondary | ICD-10-CM | POA: Diagnosis not present

## 2022-10-22 MED ORDER — CILIDINIUM-CHLORDIAZEPOXIDE 2.5-5 MG PO CAPS: 1.0000 | ORAL_CAPSULE | Freq: Three times a day (TID) | ORAL | 1 refills | Status: DC | PRN

## 2022-10-22 NOTE — Patient Instructions (Signed)
Add back benefiber 1 to 2 teaspoons daily to try and regulate stools and help with complete stools.  Continue [employer] colon product, please call and let us know what is in it, I believe it is a probiotic. Continue your other stomach and acid blocker medications as before.  I have refilled your Librax. We will call and get you scheduled for a flexible sigmoidoscopy in the near future.

## 2022-10-23 ENCOUNTER — Telehealth: Payer: Self-pay

## 2022-10-23 ENCOUNTER — Encounter: Payer: Self-pay | Admitting: *Deleted

## 2022-10-23 NOTE — Telephone Encounter (Signed)
Pt called stating that she had a severe bout of abdominal pain and bloating after eating lunch today. Pt states it is the worse the pain has ever been. Pt states that she took some mylanta and a pill under her tongue because she thought she was going to have diarrhea but ended up not having any. Pt did note seeing some bright red blood when she had a bm today. Pt is wanting to know if things need to be rethought as far as what was discussed during her ov. Please advise.

## 2022-10-23 NOTE — Telephone Encounter (Signed)
Tammy, please check in with the patient Thursday morning to see how she is doing. If she is having persistent or significant abdominal pain, please let me know.

## 2022-10-24 ENCOUNTER — Encounter: Payer: Self-pay | Admitting: Urology

## 2022-10-24 ENCOUNTER — Ambulatory Visit (INDEPENDENT_AMBULATORY_CARE_PROVIDER_SITE_OTHER): Payer: Medicare Other | Admitting: Urology

## 2022-10-24 VITALS — BP 145/69 | HR 94 | Temp 98.4°F

## 2022-10-24 DIAGNOSIS — N281 Cyst of kidney, acquired: Secondary | ICD-10-CM | POA: Diagnosis not present

## 2022-10-24 DIAGNOSIS — N3010 Interstitial cystitis (chronic) without hematuria: Secondary | ICD-10-CM | POA: Diagnosis not present

## 2022-10-24 DIAGNOSIS — Z87440 Personal history of urinary (tract) infections: Secondary | ICD-10-CM | POA: Diagnosis not present

## 2022-10-24 DIAGNOSIS — N200 Calculus of kidney: Secondary | ICD-10-CM | POA: Diagnosis not present

## 2022-10-24 MED ORDER — ALFUZOSIN HCL ER 10 MG PO TB24
10.0000 mg | ORAL_TABLET | Freq: Every day | ORAL | 3 refills | Status: DC
Start: 2022-10-24 — End: 2022-12-23

## 2022-10-24 MED ORDER — MIRABEGRON ER 50 MG PO TB24: 50.0000 mg | ORAL_TABLET | Freq: Every day | ORAL | 3 refills | Status: DC

## 2022-10-24 NOTE — Patient Instructions (Signed)
American [employer] Association (AUA) -  Diagnosis and Treatment of Interstitial Cystitis/Bladder Pain Syndrome (2022): [email or web address] pain-syndrome-(2022)  The exact cause of Interstitial Cystitis (IC) (aka Bladder Pain Syndrome) is unknown, however theories include: Dysfunction of inner bladder lining known as the glycosaminoglycan (GAG) layer. Intrinsic abnormality of the urine. Histamine-related bladder inflammation/ irritation. Neuropathic pain/ nerve up-regulation, which results in inappropriately elevated pain signaling from the nerves in/around bladder to the brain. IC is not related to any infectious process or autoimmune conditions.  Commonly related problems for patients with IC: High-tone pelvic floor dysfunction (pelvic floor muscle tightness). Can cause pelvic pain, difficulty urinating, difficulty having bowel movements, pain with intercourse. Neuropathic pain / Nerve up-regulation. Results in inappropriately elevated pain signaling from the nerves in/around bladder to the brain. Several commonly co-occurring conditions include IBS, fibromyalgia, CFIDS, migraines, TMJ syndrome, and seasonal environmental allergies. IC flare ups can mimic the symptoms of UTI with no true bacterial infection being present. Any acute exacerbation of symptoms warrants evaluation with a urine culture.  Common triggers for IC flare ups: Certain beverages and foods which irritate the bladder This can be very patient specific See IC diet for list of common dietary irritants Physical stress Emotional / psychological stress Activities that put pressure on the pelvic area/ perineum (such as sexual intercourse, long car rides, etc) Seasonal environmental allergens  IC treatment options (for daily/ routine use): 1. Patient efforts to minimize life stressors: a. Educate employer/ support people about  condition and related care needs b. Practice self care b. May benefit from mental health treatment with primary care provider or psychiatrist/ psychologist / therapist 2. For dysfunction of inner bladder lining (glycosaminoglycan (GAG) layer): a. Prescription medications: - Oral medication: Pentosan polysulfate (Elmiron) > May be cost prohibitive; not covered well by some insurance providers. Can be ordered via a compounding pharmacy as an alternative. > Requires 6 months of daily use (3 times per day) to achieve full efficacy and to assess symptomatic response. > Requires routine annual eye exam. b. Medications instilled directly into the bladder through a catheter: - Pentosan polysulfate (Elmiron) - Heparin 3. For intrinsic abnormality of the urine: Identification and avoidance of dietary bladder irritants (commonly includes caffeine, alcohol, citric acid, spicy foods, acidic foods). For histamine-related bladder inflammation/ irritation: Antihistamine medication(s): Minimizes the uptake of histamine molecules Over-the-counter medications: Zyrtec (Cetirizine) and Xyzal (Levocetirizine) are preferred Prescription medications: Hydroxyzine (Vistaril / Atarax) Mast cell stabilizer medication:  Montelukast (Singulair): Inhibits leukotriene receptors present in the bladder, thus preventing the activation of mast cells (which release pro-inflammatory mediators) For neuropathic pain/ nerve up-regulation, which results in inappropriately elevated pain signaling from the nerves in/around bladder to the brain: Prescription medications:  Such as: Amitriptyline (Elavil), Nortriptyline (Pamelor), Trazodone, Cymbalta (Duloxetine), Gabapentin (Neurontin), Lyrica (Pregabalin)  IC treatment options for flare ups (for on-demand use): Urinary analgesics for burning / painful urination Over-the-counter medications: Phenazopyradine (Pyridium). Use should be limited to no more than 3 days consecutively due to  risk for methemoglobinemia and damage to liver & bone health. Prescription medications: Uribel, Urogesic blue, Uro-MP, Urelle, etc. Can be take every 6 hours as needed May be cost prohibitive; not covered well by some insurance providers Bladder instillations: Placement of a numbing medication (such as Lidocaine + sodium bicarbonate or Bupivacaine) through a catheter into the bladder. Can be done in-office at a nurse visit or at home Note: These medications are currently in short supply nationwide and may be difficult to obtain until the shortage improves. Can add on an  over-the-counter antihistamine or TUMS (if not already taking)  Management options for high-tone pelvic floor dysfunction secondary to IC: Stretching, relaxation techniques (such as deep breathing/ meditation), yoga, avoidance of Kegels or other activities which strain or tighten pelvic floor muscles Pelvic floor physical therapy Oral muscle relaxer medications such as Baclofen (Lioresal), Methocarbamol (Robaxin), Tizanadine (Zanaflex), Cyclobenzaprine (Flexeril) Vaginal or rectal Diazepam (Valium) suppositories Trigger point injections Pelvic nerve blocks  Additional IC treatment options: Some patients find benefit from use of baking soda to reduce the acidity of the urine. Can dissolve half a teaspoon in a full glass of water and drink twice a day. Increased water intake can dilute urine concentration - may help by reducing urine acidity.  Supplements such as Bladder Renew, CystoProtek, Cysta-Q, Prelief, Freeze-dried aloe vera, Marshmallow root, etc. These have not been evaluated by the FDA. Bladder Renew, CystoProtek, Cysta-Q: Sometimes used as an alternative to Elmiron. Prelief is an over the counter medication available at most pharmacies. Prelief works to remove acid from highly acidic foods such as tomato sauce, orange juice, coffee, and wine. Prelief can be added directly to these foods and helps to reduce bladder pain  and urinary urgency. Cystoscopy with hydrodistention  a. This is an outpatient surgical procedure done in the OR under anesthesia for diagnostic benefit and  potential therapeutic benefit. It helps clinicians rule out other conditions, assess/ treat pain triggers such as Hunner's ulcers in the bladder, and evaluate bladder capacity (size). There is approximately 60% chance of symptomatic improvement with hydrodistention; the response can be quite variable. Rare surgical interventions Suprapubic catheter placement Bladder removal (cystectomy) with urinary diversion (such as ileal conduit, [place] pouch, or neobladder creation)     Common triggers for IC flare ups: Certain beverages and foods which irritate the bladder This can be very patient specific See IC diet for list of common dietary irritants Physical stress Emotional / psychological stress Activities that put pressure on the pelvic area I perineum (such as sexual intercourse, long car rides, etc) Seasonal environmental allergens Urinary tract infection (UTI). You can call the urology office [phone]) to request lab order for urine testing if desired. May require antibiotic therapy if deemed appropriate based on urine culture result. Post-infectious (post-UTI) bladder inflammation. Can take several days or weeks to resolve.  IC treatment options for flare ups (for on-demand use):  Urinary analgesics for burning / painful urination Over-the-counter medications:  Phenazopyradine (Pyridium). Commonly known under the AZO brand name. Use should be limited to no more than 3 days consecutively due to risk for methemoglobinemia and damage to liver & bone health. Prescription medications: Uribel, Urogesic blue, Uro-MP, Urelle, etc. Can be take every 6 hours as needed May be cost prohibitive; not covered well by some insurance providers Antihistamine use to minimize histamine-mediated bladder inflammation / pain If taking  Hydroxyzine (a prescription antihistamine), may temporarily increase dose at bedtime as directed by provider. Can add on an over-the-counter antihistamine (Zyrtec or Xyzal preferred). Bladder instillations: Placement of a numbing medication (such as Lidocaine + sodium bicarbonate or Bupivacaine) through a catheter into the bladder.  b. Can be done in-office at a nurse visit or at home.  c. Note: These medications are currently in short supply nationwide and may be difficult to obtain until the shortage improves.  Other: If symptoms fail to improve in a timely fashion, could also consider cystoscopy with hydrodistention for potential diagnostic and therapeutic benefit.      INTERSTITIAL CYSTITIS DIET  Many people with Interstitial Cystitis  find that simple changes in their diet can help to control IC symptoms and avoid IC flare-ups. Typically, avoiding foods high in acid and potassium, as well as beverages containing caffeine and alcohol, is a good idea. This helpful guide can help you make "IC-Smart" meal choices. Keep it handy for easy reference when dining out or when preparing meals at home.  FRUITS: Allowable - Blueberries, Melons (except cantaloupe), Min[place]id Acid-Reduced Orange Juice (this can be diluted using water or Prelief* to prevent a flare-up) and Pears.  Avoidable -All other Fruits and Juices.  VEGETABLES:  Allowable - Homegrown Tomatoes, [place], Alfalfa, Beet, Broccoli, Bok Choy, [address], [place], [place], [place], [place], [place], [place], [place], [place], [place] and [employer].  Avoidable - Store-bought Tomatoes, Onions, Tofu, Soybeans, Lima beans, Fava Beans, Asparagus, Beet Greens, [place], [place], [place], [place], [place], [place], Mushrooms, [place], [place], [place], [place], [employer] and Rhubarb.  MEATS/FISH: Allowable - Beef, Chicken, [place], [place], [address], [address], [place], [place], [place], [place], [place], [place], [place], [place], [place], [place] and Shrimp.  Avoidable - Chicken Liver, Corned Beef, Pickled Herring and Fresh Herring, Aged, [address], Cured, Processed or Smoked Meats/Fish, [place], [place], [address], [place], [place], [place], [place], [place], [place], [place] and meats that contain nitrates or nitrites.  CARBS/GRAINS:  Allowable - Pasta, Rice, Potatoes, White Bread, Bread made of Rice, Millet, Quinoa, Buckwheat, [place], Refined, Cooked or Ready to Eat Cereal.  Avoidable - Rye Bread, Sourdough Breads and Fresh Baked Yeast Products  FATS:    DAIRY: Allowable - Butter, Margarine, Vegetable Oils, Almonds, Cashews and Pine Nuts. Avoidable - Any other Nuts, Mayonnaise, Salad Dressing. Allowable - Milk, American Cheese, [address], Cream Cheese, String Cheese, [place], Ricotta Cheese, Frozen Yogurt and White Chocolate.   Avoidable - Yogurt, Goat's Milk, Chocolate Milk, Sour Cream, Aged Cheeses, Chocolate, [place], Camembert, Cheddar, [address], [place], [place], [place], [place], [place], [place], [place], [place] and [place].  SOUPS: Allowable - Soups made with allowed Vegetables, Cream of Broccoli, Water Cress Soup, Cream of Cauliflower Soup.  Avoidable -Any packaged Soups.  SEASONINGS: Allowable - Garlic and other seasonings not listed below.  Avoid - Miso, Soy Sauce, Vinegar and any Spicy Foods (especially [place], [place], [place] and [place] foods)  BEVERAGES:  Allowable - Bottled or Spring Water, Decaffeinated, Acid-free or Low Acid Coffee or Tea, Alfalfa Leaf Tea, Sun Tea, Kava Instant Coffee, Peppermint Tea, Golden Seal Tea, Flat Soda and Low Acid Wines.  Avoid -Alcoholic Beverages (except Low Acid Wines), Carbonated Drinks such as Soda, Coffee and Tea (except what is listed above), Fruit Juices and Chamomile Tea.  PRESERVATIVE: Avoid - Benzol Alcohol, Citric Acid, Monosodium Glutamate (MSG), Aspartame, Saccharin and foods containing Artificial Ingredients/Colors.

## 2022-10-24 NOTE — Progress Notes (Signed)
Name: Deborah Gray DOB: 07/28/1933 MRN: [id number]  History of Present Illness: Deborah Gray is a 89 y.o. female who presents today for follow up visit at [facility] [place]. Interstitial cystitis with urinary frequency and urgency. Recurrent UTI. Thought to be secondary to prior kidney stone per patient. Last known positive urine culture was 12/19/2021 (E. Coli).  Kidney stones. - Underwent right ureteroscopic stone manipulation 03/29/2021. Bilateral kidney cysts. Small cystocele and small rectocele.  - Per CT abdomen/pelvis w/ contrast on 03/10/2022.   At last visit with Dr. Ronne Binning on 08/14/2022: - Seen for increased bladder pain. - Cystoscopic evaluation was unremarkable.  - The plan was to start Uroxatral 10 mg nightly.  Since last visit: > 10/10/2022: Abdominal xray showed no radiopaque kidney stones. Increased stool noted consistent with constipation.   Today: She reports that sometimes she has a difficult time differentiating her bladder pain versus bowel symptoms. She was seen by GI on 10/22/2022 and is being scheduled for flexible sigmoidoscopy; she states her LUTS have been a little worse since rectal exam at that visit.  In general she describes her bladder pain as intermittent. She reports urinary urgency, frequency, nocturia, and urge incontinence 1-2x/week; wears pads. Reports occasional dysuria, urinary hesitancy, straining to void, and sensations of incomplete emptying.  Denies any recent stone episodes.  She denies acute flank or abdominal pain.  Current treatment regimen includes: Myrbetriq 50 mg daily bladder instillations PRN with DMSO Hiprex 1 g 2x/day for UTI prevention Uroxatral 10 mg nightly - Pyridium PRN  Previous treatments have included: Zyrtec  Baclofen  Gabapentin  Imipramine Urelle Gemtesa Levsin PRN   Fall Screening: Do you usually have a device to assist in your mobility? No   Medications: Current Outpatient Medications   Medication Sig Dispense Refill   acetaminophen (TYLENOL) 500 MG tablet Take 500 mg by mouth at bedtime.     ascorbic acid (VITAMIN C) 500 MG tablet Take 500 mg by mouth daily.     aspirin EC 81 MG tablet Take 81 mg by mouth at bedtime.     Carboxymethylcellulose Sodium (THERATEARS) 0.25 % SOLN Place 1 drop into both eyes 2 (two) times daily.     cholecalciferol (VITAMIN D3) 25 MCG (1000 UNIT) tablet Take 1,000 Units by mouth at bedtime.     clidinium-chlordiazePOXIDE (LIBRAX) 5-2.5 MG capsule Take 1 capsule by mouth 3 (three) times daily as needed (for esophageal symptoms). 90 capsule 1   ezetimibe (ZETIA) 10 MG tablet TAKE 1 TABLET BY MOUTH EVERY DAY 90 tablet 1   Guaifenesin 1200 MG TB12 Take 1,200 mg by mouth daily with supper.     hyoscyamine (LEVSIN SL) 0.125 MG SL tablet USE 1 TABLET UNDER THE TONGUE BEFORE MEALS AND AT BEDTIME AS NEEDED FOR FLARES IN SYMPTOMS, DIARRHEA/ABDOMINAL CRAMPING 270 tablet 1   ipratropium (ATROVENT) 0.06 % nasal spray SMARTSIG:2 Spray(s) Both Nares Twice Daily PRN     losartan (COZAAR) 50 MG tablet TAKE 1 TABLET BY MOUTH EVERY DAY 90 tablet 3   meclizine (ANTIVERT) 25 MG tablet TAKE 1 TABLET BY MOUTH THREE TIMES A DAY AS NEEDED 30 tablet 1   metFORMIN (GLUCOPHAGE) 500 MG tablet TAKE ONE TABLET BY MOUTH EVERY MORNING ONE AT LUNCH AND 2 AT BEDTIME 360 tablet 1   methenamine (HIPREX) 1 g tablet TAKE 1 TABLET BY MOUTH TWICE A DAY 180 tablet 3   Multiple Vitamins-Minerals (PRESERVISION AREDS 2) CAPS Take 1 capsule by mouth 2 (two) times daily.  nateglinide (STARLIX) 60 MG tablet Take 60 mg by mouth 2 (two) times daily with a meal.     omega-3 acid ethyl esters (LOVAZA) 1 g capsule Take 2 g by mouth every morning.     Pancrelipase, Lip-Prot-Amyl, (CREON) 24000-76000 units CPEP TAKE 3 CAPSULES THREE TIMES DAILY WITH MEALS AND ONE CAPSULE WITH SNACK TWICE DAILY 1000 capsule 5   RABEprazole (ACIPHEX) 20 MG tablet Take 1 tablet (20 mg total) by mouth daily. 30 tablet  11   alfuzosin (UROXATRAL) 10 MG 24 hr tablet Take 1 tablet (10 mg total) by mouth daily with breakfast. 90 tablet 3   mirabegron ER (MYRBETRIQ) 50 MG TB24 tablet Take 1 tablet (50 mg total) by mouth daily. 90 tablet 3   No current facility-administered medications for this visit.    Allergies: Allergies  Allergen Reactions   Adhesive [Tape] Other (See Comments)     Blisters    Estrogens Other (See Comments)    Migraines    Sulfonamide Derivatives Rash    Past Medical History:  Diagnosis Date   Arthritis    Benign neoplasm of colon    Constipation    Diaphragmatic hernia without mention of obstruction or gangrene    Diverticulosis of colon (without mention of hemorrhage)    Dysphagia, pharyngoesophageal phase    Esophageal reflux    Essential hypertension    Gastritis    Heart murmur    Hemorrhoids    Hyperlipidemia    Internal hemorrhoids without mention of complication    Interstitial cystitis    Left wrist fracture    PONV (postoperative nausea and vomiting)    PSVT (paroxysmal supraventricular tachycardia)    Stricture and stenosis of esophagus    Type 2 diabetes mellitus (HCC)    Past Surgical History:  Procedure Laterality Date   ABDOMINAL HYSTERECTOMY     BACTERIAL OVERGROWTH TEST N/A 09/06/2015   Procedure: BACTERIAL OVERGROWTH TEST;  Surgeon: Corbin Ade, MD;  Location: [facility];  Service: Endoscopy;  Laterality: N/A;  800   BIOPSY  01/02/2022   Procedure: BIOPSY;  Surgeon: Corbin Ade, MD;  Location: [facility];  Service: Endoscopy;;   BREAST LUMPECTOMY Right    benign   CATARACT EXTRACTION Bilateral 2006   Implants in both, surgeries done 6 weeks apart   CHOLECYSTECTOMY     COLONOSCOPY  03/08/02   Jarold Motto: diverticulosis, internal hemorrhoids, adenomatous colon polyp   COLONOSCOPY  01/23/04   Jarold Motto: diverticulosis, internal hemorrhoids   COLONOSCOPY  09/25/05   Jarold Motto: diverticulosis   COLONOSCOPY  07/05/09   Jarold Motto: severe  diverticulosis   COLONOSCOPY  01/21/11   Jarold Motto: severe diverticulosis in sigmoid to desc colon, int hemorrhoids, follow up TCS in 5 years   COLONOSCOPY N/A 04/10/2016   [name]: diverticulosis   CYSTOSCOPY W/ URETERAL STENT PLACEMENT Right 03/15/2021   Procedure: CYSTOSCOPY WITH RETROGRADE PYELOGRAM/URETERAL STENT PLACEMENT;  Surgeon: Jannifer Hick, MD;  Location: [facility];  Service: Urology;  Laterality: Right;   CYSTOSCOPY WITH RETROGRADE PYELOGRAM, URETEROSCOPY AND STENT PLACEMENT Right 03/29/2021   Procedure: CYSTOSCOPY WITH RETROGRADE PYELOGRAM, URETEROSCOPY AND STENT EXCHANGE;  Surgeon: Malen Gauze, MD;  Location: [facility];  Service: Urology;  Laterality: Right;   DILATION AND CURETTAGE OF UTERUS     ESOPHAGEAL MANOMETRY  03/21/08   Patterson: findings c/w [place]acker Esophagus   ESOPHAGOGASTRODUODENOSCOPY  03/08/02   Jarold Motto: esophageal stricture, chronic gerd, s/p dilation.    ESOPHAGOGASTRODUODENOSCOPY  01/23/04   Jarold Motto: esophageal stricture,  gastritis, hiatal hernia   ESOPHAGOGASTRODUODENOSCOPY  09/25/05   Jarold Motto: gastritis, benign bx, no H.pylori   ESOPHAGOGASTRODUODENOSCOPY  07/05/09   Jarold Motto: gastropathy, benign small bowel bx and gastric bx   ESOPHAGOGASTRODUODENOSCOPY N/A 05/15/2015   Dr. Jena Gauss- diffuse moderate inflammation characterized by congestion (edema), erythema, and linear erosions was found in the entire examined stomach. bx= reactive gastropathy   ESOPHAGOGASTRODUODENOSCOPY (EGD) WITH PROPOFOL N/A 01/02/2022   Procedure: ESOPHAGOGASTRODUODENOSCOPY (EGD) WITH PROPOFOL;  Surgeon: Corbin Ade, MD;  [facility];  Service: Endoscopy;  Laterality: N/A;  10:30am, asa 2   FOOT SURGERY Left    HEMORRHOID BANDING  2017   Dr.[name]   HOLMIUM LASER APPLICATION Right 03/29/2021   Procedure: HOLMIUM LASER APPLICATION;  Surgeon: Malen Gauze, MD;  Location: [facility];  Service: Urology;  Laterality: Right;   LAPAROSCOPIC VAGINAL HYSTERECTOMY     MALONEY  DILATION N/A 01/02/2022   Procedure: Elease Hashimoto DILATION;  Surgeon: Corbin Ade, MD;  Location: [facility];  Service: Endoscopy;  Laterality: N/A;   ORIF WRIST FRACTURE Left 06/23/2018   Procedure: OPEN REDUCTION INTERNAL FIXATION (ORIF) LEFT WRIST FRACTURE;  Surgeon: Sheral Apley, MD;  Location:  SURGERY CENTER;  Service: Orthopedics;  Laterality: Left;  regional arm block   RECTOCELE REPAIR     TOTAL KNEE ARTHROPLASTY Right    Family History  Problem Relation Age of Onset   Ovarian cancer Mother    Diabetes Father    Heart disease Father    Breast cancer Sister    Liver cancer Sister    Colon cancer Cousin        Paternal side   Diabetes Maternal Aunt    Liver disease Cousin        Maternal side, never drank   Social History   Socioeconomic History   Marital status: Widowed    Spouse name: Not on file   Number of children: 3   Years of education: Not on file   Highest education level: Not on file  Occupational History   Occupation: Retired  Tobacco Use   Smoking status: Never   Smokeless tobacco: Never   Tobacco comments:    Never smoked  Vaping Use   Vaping status: Never Used  Substance and Sexual Activity   Alcohol use: No    Alcohol/week: 0.0 standard drinks of alcohol   Drug use: No   Sexual activity: Not Currently  Other Topics Concern   Not on file  Social History Narrative   Widowed since 07/2018. Married x 61 years.   3 sons. 12, 1963 and 1966.   Social Determinants of Health   Financial Resource Strain: Low Risk  (05/31/2022)   Overall Financial Resource Strain (CARDIA)    Difficulty of Paying Living Expenses: Not hard at all  Food Insecurity: No Food Insecurity (05/31/2022)   Hunger Vital Sign    Worried About Running Out of Food in the Last Year: Never true    Ran Out of Food in the Last Year: Never true  Transportation Needs: No Transportation Needs (05/31/2022)   PRAPARE - Administrator, Civil Service (Medical): No     Lack of Transportation (Non-Medical): No  Physical Activity: Sufficiently Active (05/31/2022)   Exercise Vital Sign    Days of Exercise per Week: 5 days    Minutes of Exercise per Session: 30 min  Stress: No Stress Concern Present (05/31/2022)   [employer] of Occupational Health - Occupational Stress Questionnaire  Feeling of Stress : Not at all  Social Connections: Moderately Integrated (05/31/2022)   Social Connection and Isolation Panel [NHANES]    Frequency of Communication with Friends and Family: More than three times a week    Frequency of Social Gatherings with Friends and Family: More than three times a week    Attends Religious Services: More than 4 times per year    Active Member of [employer] or Organizations: Yes    Attends Banker Meetings: More than 4 times per year    Marital Status: Widowed  Intimate Partner Violence: Not At Risk (05/31/2022)   Humiliation, Afraid, Rape, and Kick questionnaire    Fear of Current or Ex-Partner: No    Emotionally Abused: No    Physically Abused: No    Sexually Abused: No    SUBJECTIVE  Review of Systems Constitutional: Patient denies any unintentional weight loss or change in strength lntegumentary: Patient denies any rashes or pruritus Cardiovascular: Patient denies chest pain or syncope Respiratory: Patient denies shortness of breath Gastrointestinal: Patient reports constipation, fecal smearing, intermittent abdominal cramping Musculoskeletal: Patient denies muscle cramps or weakness Neurologic: Patient denies convulsions or seizures Psychiatric: Patient denies memory problems Allergic/Immunologic: Patient denies recent allergic reaction(s) Hematologic/Lymphatic: Patient denies bleeding tendencies Endocrine: Patient denies heat/cold intolerance  GU: As per HPI.  OBJECTIVE Vitals:   10/24/22 1546  BP: (!) 145/69  Pulse: 94  Temp: 98.4 F (36.9 C)   There is no height or weight on file to calculate  BMI.  Physical Examination  Constitutional: No obvious distress; patient is non-toxic appearing  Cardiovascular: No visible lower extremity edema.  Respiratory: The patient does not have audible wheezing/stridor; respirations do not appear labored  Gastrointestinal: Abdomen non-distended Musculoskeletal: Normal ROM of UEs  Skin: No obvious rashes/open sores  Neurologic: CN 2-12 grossly intact Psychiatric: Answered questions appropriately with normal affect  Hematologic/Lymphatic/Immunologic: No obvious bruises or sites of spontaneous bleeding  UA: 6-10 WBC/hpf, bacteria (few)  ASSESSMENT Interstitial cystitis - Plan: Urinalysis, Routine w reflex microscopic, alfuzosin (UROXATRAL) 10 MG 24 hr tablet, mirabegron ER (MYRBETRIQ) 50 MG TB24 tablet  Nephrolithiasis - Plan: Urinalysis, Routine w reflex microscopic, DG Abd 1 View  History of recurrent UTIs  Bilateral renal cysts  She is mostly struggling with GI issues at this time and has follow up planned for that. No acute findings or concerns at this time from GU standpoint. We reviewed recent imaging - no stones per KUB. Will continue current medications; refills sent for Myrbetriq and Uroxatral.  We discussed importance of limiting Pyridium (phenazopyridine) use to 3 days consecutively due to risk for liver injury, methemoglobinemia, and damage to bone health.  Will plan for follow up in 6 months with KUB for stone surveillance or sooner if needed; will plan for that to be with Dr. Ronne Binning due to patient complexity. She may also follow up every 1-4 weeks as needed for nurse visit bladder instillations.   Pt verbalized understanding and agreement. All questions were answered.  PLAN Advised the following: Continue Myrbetriq 50 mg daily for urinary urgency / frequency / urge incontinence. 2. Continue Uroxatral 10 mg nightly for bladder spasms.  3. Continue Hiprex 1 g 2x/day for UTI prevention. 4. Continue DMSO bladder instillations  PRN for bladder pain. 5. Pyridium PRN for dysuria. 6. Return in about 6 months (around 04/23/2023) for KUB, UA, PVR, & f/u with Dr. Ronne Binning.  Orders Placed This Encounter  Procedures   DG Abd 1 View    Standing  Status:   Future    Standing Expiration Date:   10/24/2023    Order Specific Question:   Reason for Exam (SYMPTOM  OR DIAGNOSIS REQUIRED)    Answer:   kidney stone    Order Specific Question:   Preferred imaging location?    Answer:   [facility]   Urinalysis, Routine w reflex microscopic    It has been explained that the patient is to follow regularly with their PCP in addition to all other providers involved in their care and to follow instructions provided by these respective offices. Patient advised to contact urology clinic if any urologic-pertaining questions, concerns, new symptoms or problems arise in the interim period.  Patient Instructions  American Urological Association (AUA) -  Diagnosis and Treatment of Interstitial Cystitis/Bladder Pain Syndrome (2022): [email or web address] pain-syndrome-(2022)  The exact cause of Interstitial Cystitis (IC) (aka Bladder Pain Syndrome) is unknown, however theories include: Dysfunction of inner bladder lining known as the glycosaminoglycan (GAG) layer. Intrinsic abnormality of the urine. Histamine-related bladder inflammation/ irritation. Neuropathic pain/ nerve up-regulation, which results in inappropriately elevated pain signaling from the nerves in/around bladder to the brain. IC is not related to any infectious process or autoimmune conditions.  Commonly related problems for patients with IC: High-tone pelvic floor dysfunction (pelvic floor muscle tightness). Can cause pelvic pain, difficulty urinating, difficulty having bowel movements, pain with intercourse. Neuropathic pain / Nerve up-regulation. Results in inappropriately  elevated pain signaling from the nerves in/around bladder to the brain. Several commonly co-occurring conditions include IBS, fibromyalgia, CFIDS, migraines, TMJ syndrome, and seasonal environmental allergies. IC flare ups can mimic the symptoms of UTI with no true bacterial infection being present. Any acute exacerbation of symptoms warrants evaluation with a urine culture.  Common triggers for IC flare ups: Certain beverages and foods which irritate the bladder This can be very patient specific See IC diet for list of common dietary irritants Physical stress Emotional / psychological stress Activities that put pressure on the pelvic area/ perineum (such as sexual intercourse, long car rides, etc) Seasonal environmental allergens  IC treatment options (for daily/ routine use): 1. Patient efforts to minimize life stressors: a. Educate employer/ support people about condition and related care needs b. Practice self care b. May benefit from mental health treatment with primary care provider or psychiatrist/ psychologist / therapist 2. For dysfunction of inner bladder lining (glycosaminoglycan (GAG) layer): a. Prescription medications: - Oral medication: Pentosan polysulfate (Elmiron) > May be cost prohibitive; not covered well by some insurance providers. Can be ordered via a compounding pharmacy as an alternative. > Requires 6 months of daily use (3 times per day) to achieve full efficacy and to assess symptomatic response. > Requires routine annual eye exam. b. Medications instilled directly into the bladder through a catheter: - Pentosan polysulfate (Elmiron) - Heparin 3. For intrinsic abnormality of the urine: Identification and avoidance of dietary bladder irritants (commonly includes caffeine, alcohol, citric acid, spicy foods, acidic foods). For histamine-related bladder inflammation/ irritation: Antihistamine medication(s): Minimizes the uptake of histamine  molecules Over-the-counter medications: Zyrtec (Cetirizine) and Xyzal (Levocetirizine) are preferred Prescription medications: Hydroxyzine (Vistaril / Atarax) Mast cell stabilizer medication:  Montelukast (Singulair): Inhibits leukotriene receptors present in the bladder, thus preventing the activation of mast cells (which release pro-inflammatory mediators) For neuropathic pain/ nerve up-regulation, which results in inappropriately elevated pain signaling from the nerves in/around bladder to the brain: Prescription medications:  Such as: Amitriptyline (Elavil), Nortriptyline (Pamelor), Trazodone, Cymbalta (Duloxetine), Gabapentin (Neurontin), Lyrica (Pregabalin)  IC  treatment options for flare ups (for on-demand use): Urinary analgesics for burning / painful urination Over-the-counter medications: Phenazopyradine (Pyridium). Use should be limited to no more than 3 days consecutively due to risk for methemoglobinemia and damage to liver & bone health. Prescription medications: Uribel, Urogesic blue, Uro-MP, Urelle, etc. Can be take every 6 hours as needed May be cost prohibitive; not covered well by some insurance providers Bladder instillations: Placement of a numbing medication (such as Lidocaine + sodium bicarbonate or Bupivacaine) through a catheter into the bladder. Can be done in-office at a nurse visit or at home Note: These medications are currently in short supply nationwide and may be difficult to obtain until the shortage improves. Can add on an over-the-counter antihistamine or TUMS (if not already taking)  Management options for high-tone pelvic floor dysfunction secondary to IC: Stretching, relaxation techniques (such as deep breathing/ meditation), yoga, avoidance of Kegels or other activities which strain or tighten pelvic floor muscles Pelvic floor physical therapy Oral muscle relaxer medications such as Baclofen (Lioresal), Methocarbamol (Robaxin), Tizanadine (Zanaflex),  Cyclobenzaprine (Flexeril) Vaginal or rectal Diazepam (Valium) suppositories Trigger point injections Pelvic nerve blocks  Additional IC treatment options: Some patients find benefit from use of baking soda to reduce the acidity of the urine. Can dissolve half a teaspoon in a full glass of water and drink twice a day. Increased water intake can dilute urine concentration - may help by reducing urine acidity.  Supplements such as Bladder Renew, CystoProtek, Cysta-Q, Prelief, Freeze-dried aloe vera, Marshmallow root, etc. These have not been evaluated by the FDA. Bladder Renew, CystoProtek, Cysta-Q: Sometimes used as an alternative to Elmiron. Prelief is an over the counter medication available at most pharmacies. Prelief works to remove acid from highly acidic foods such as tomato sauce, orange juice, coffee, and wine. Prelief can be added directly to these foods and helps to reduce bladder pain and urinary urgency. Cystoscopy with hydrodistention  a. This is an outpatient surgical procedure done in the OR under anesthesia for diagnostic benefit and   potential therapeutic benefit. It helps clinicians rule out other conditions, assess/ treat pain triggers such as Hunner's ulcers in the bladder, and evaluate bladder capacity (size). There is approximately 60% chance of symptomatic improvement with hydrodistention; the response can be quite variable. Rare surgical interventions Suprapubic catheter placement Bladder removal (cystectomy) with urinary diversion (such as ileal conduit, [place] pouch, or neobladder creation)     Common triggers for IC flare ups: Certain beverages and foods which irritate the bladder This can be very patient specific See IC diet for list of common dietary irritants Physical stress Emotional / psychological stress Activities that put pressure on the pelvic area I perineum (such as sexual intercourse, long car rides, etc) Seasonal environmental allergens Urinary  tract infection (UTI). You can call the urology office [phone]) to request lab order for urine testing if desired. May require antibiotic therapy if deemed appropriate based on urine culture result. Post-infectious (post-UTI) bladder inflammation. Can take several days or weeks to resolve.  IC treatment options for flare ups (for on-demand use):  Urinary analgesics for burning / painful urination Over-the-counter medications:  Phenazopyradine (Pyridium). Commonly known under the AZO brand name. Use should be limited to no more than 3 days consecutively due to risk for methemoglobinemia and damage to liver & bone health. Prescription medications: Uribel, Urogesic blue, Uro-MP, Urelle, etc. Can be take every 6 hours as needed May be cost prohibitive; not covered well by some insurance providers Antihistamine use  to minimize histamine-mediated bladder inflammation / pain If taking Hydroxyzine (a prescription antihistamine), may temporarily increase dose at bedtime as directed by provider. Can add on an over-the-counter antihistamine (Zyrtec or Xyzal preferred). Bladder instillations: Placement of a numbing medication (such as Lidocaine + sodium bicarbonate or Bupivacaine) through a catheter into the bladder.  b. Can be done in-office at a nurse visit or at home.   c. Note: These medications are currently in short supply nationwide and may be difficult to obtain until the shortage improves.  Other: If symptoms fail to improve in a timely fashion, could also consider cystoscopy with hydrodistention for potential diagnostic and therapeutic benefit.      INTERSTITIAL CYSTITIS DIET  Many people with Interstitial Cystitis find that simple changes in their diet can help to control IC symptoms and avoid IC flare-ups. Typically, avoiding foods high in acid and potassium, as well as beverages containing caffeine and alcohol, is a good idea. This helpful guide can help you make "IC-Smart"  meal choices. Keep it handy for easy reference when dining out or when preparing meals at home.  FRUITS: Allowable - Blueberries, Melons (except cantaloupe), Min[place]id Acid-Reduced Orange Juice (this can be diluted using water or Prelief* to prevent a flare-up) and Pears.  Avoidable -All other Fruits and Juices.  VEGETABLES:  Allowable - Homegrown Tomatoes, [place], Alfalfa, Beet, Broccoli, Bok Choy, [address], [place], [place], [place], [place], [place], [place], [place], [place], Mustard [place] and [employer].  Avoidable - Store-bought Tomatoes, Onions, Tofu, Soybeans, Lima beans, Fava Beans, Asparagus, Beet Greens, [place], [place], [place], [place], [place], [place], Mushrooms, [place], [place], [place], [place], [employer] and Rhubarb.  MEATS/FISH: Allowable - Beef, Chicken, [place], [place], [address], [address], [place], [place], [place], [place], [place], [place], [place], [place], [place], [place] and Shrimp.  Avoidable - Chicken Liver, Corned Beef, Pickled Herring and Fresh Herring, Aged, [address], Cured, Processed or Smoked Meats/Fish, [place], [place], [address], [place], [employer], [place], [place], [place], [place], [place] and meats that contain nitrates or nitrites.  CARBS/GRAINS:  Allowable - Pasta, Rice, Potatoes, White Bread, Bread made of Rice, Millet, Quinoa, Buckwheat, [place], Refined, Cooked or Ready to Eat Cereal.  Avoidable - Rye Bread, Sourdough Breads and Fresh Baked Yeast Products  FATS:    DAIRY: Allowable - Butter, Margarine, Vegetable Oils, Almonds, Cashews and Pine Nuts. Avoidable - Any other Nuts, Mayonnaise, Salad Dressing. Allowable - Milk, American Cheese, [address], Cream Cheese, String Cheese, [place], Ricotta Cheese, Frozen Yogurt and White Chocolate.   Avoidable - Yogurt, Goat's Milk, Chocolate Milk, Sour Cream, Aged Cheeses, Chocolate,  [place], Camembert, Cheddar, [address], [place], [place], [place], [place], [place], [place], [place], [place] and  [place].  SOUPS: Allowable - Soups made with allowed Vegetables, Cream of Broccoli, Water Cress Soup, Cream of Cauliflower Soup.  Avoidable -Any packaged Soups.   SEASONINGS: Allowable - Garlic and other seasonings not listed below.  Avoid - Miso, Soy Sauce, Vinegar and any Spicy Foods (especially [place], [place], [place] and [place] foods)  BEVERAGES:  Allowable - Bottled or Spring Water, Decaffeinated, Acid-free or Low Acid Coffee or Tea, Alfalfa Leaf Tea, Sun Tea, Kava Instant Coffee, Peppermint Tea, Golden Seal Tea, Flat Soda and Low Acid Wines.  Avoid -Alcoholic Beverages (except Low Acid Wines), Carbonated Drinks such as Soda, Coffee and Tea (except what is listed above), Fruit Juices and Chamomile Tea.  PRESERVATIVE: Avoid - Benzol Alcohol, Citric Acid, Monosodium Glutamate (MSG), Aspartame, Saccharin and foods containing Artificial Ingredients/Colors.   Electronically signed by:  Donnita Falls, MSN, FNP-C, CUNP 10/24/2022 4:29  PM

## 2022-10-24 NOTE — Telephone Encounter (Signed)
Spoke with pt and she is doing much better this morning. States that she is having no pain today.

## 2022-10-25 LAB — URINALYSIS, ROUTINE W REFLEX MICROSCOPIC
Bilirubin, UA: NEGATIVE
Glucose, UA: NEGATIVE
Ketones, UA: NEGATIVE
Nitrite, UA: NEGATIVE
Protein,UA: NEGATIVE
RBC, UA: NEGATIVE
Specific Gravity, UA: 1.01 (ref 1.005–1.030)
Urobilinogen, Ur: 0.2 mg/dL (ref 0.2–1.0)
pH, UA: 7 (ref 5.0–7.5)

## 2022-10-25 LAB — MICROSCOPIC EXAMINATION

## 2022-10-29 ENCOUNTER — Telehealth: Payer: Self-pay | Admitting: Urology

## 2022-10-29 ENCOUNTER — Telehealth: Payer: Self-pay

## 2022-10-29 NOTE — Telephone Encounter (Signed)
Pt called stating that she had a rectocele repair in the past and was told that she has a lot of scar tissue growing around her colon and bladder and is wondering if the scar tissue could be causing some of her problems. Please advise. Pt also dropped off the bottle of supplements that she has been taking that she stated you wanted to know the name of (bottle is in your office)

## 2022-10-29 NOTE — Telephone Encounter (Signed)
Patient wanted to discuss concerns with provider, she was added to sarah's schedule for 10/30/2022 at 0900

## 2022-10-29 NOTE — Progress Notes (Unsigned)
Name: Deborah Gray DOB: Jan 22, 1934 MRN: [id number]  History of Present Illness: Deborah Gray is a 89 y.o. female who presents today for follow up visit at [facility] [place]. 1. Interstitial cystitis with urinary frequency and urgency. 2. Recurrent UTI.  - Thought to be secondary to prior kidney stone per patient. Last known positive urine culture was 12/19/2021 (E. Coli).  - 08/14/2022: Cystoscopic evaluation by Dr. Ronne Binning was unremarkable. 3. Kidney stones. - Underwent right ureteroscopic stone manipulation 03/29/2021. > 10/10/2022: Abdominal xray showed no radiopaque kidney stones.  4. Bilateral kidney cysts. 5. Small cystocele and small rectocele. - Per CT abdomen/pelvis w/ contrast on 03/10/2022.    At last visit on 10/24/2022: - Mostly struggling with GI issues; has follow up planned for that.  - No acute GU concerns. - The plan was: 1. Continue Myrbetriq 50 mg daily for urinary urgency / frequency / urge incontinence. 2. Continue Uroxatral 10 mg nightly for bladder spasms.  3. Continue Hiprex 1 g 2x/day for UTI prevention. 4. Continue DMSO bladder instillations PRN for bladder pain. 5. Pyridium PRN for dysuria. 6. Return in about 6 months (around 04/23/2023) for KUB, UA, PVR, & f/u with Dr. Ronne Binning.  Today: She reports history of rectocele repair when she was 89 years old and she is concerned that scar tissue in that area may be blocking something - she is under the impression from another provider that the scar tissue continues to grow over time.   She denies any change in her GU symptoms since last office visit 6 days ago, which were reviewed together today. She reports significant frustration about her multiple complex medical problems such as back pain, hip pain, eye problems, bowel problems, etc.  Current treatment regimen includes: Myrbetriq 50 mg daily bladder instillations PRN with DMSO Hiprex 1 g 2x/day for UTI prevention Uroxatral 10 mg  nightly Pyridium PRN Levsin PRN   Previous treatments have included: Zyrtec  Baclofen Gabapentin Imipramine Urelle Gemtesa pelvic floor physical therapy: not particularly helpful in the past   Fall Screening: Do you usually have a device to assist in your mobility? No   Medications: Current Outpatient Medications  Medication Sig Dispense Refill   acetaminophen (TYLENOL) 500 MG tablet Take 500 mg by mouth at bedtime.     alfuzosin (UROXATRAL) 10 MG 24 hr tablet Take 1 tablet (10 mg total) by mouth daily with breakfast. 90 tablet 3   ascorbic acid (VITAMIN C) 500 MG tablet Take 500 mg by mouth daily.     aspirin EC 81 MG tablet Take 81 mg by mouth at bedtime.     baclofen (LIORESAL) 10 MG tablet Take 1 tablet (10 mg total) by mouth at bedtime. 30 tablet 5   Carboxymethylcellulose Sodium (THERATEARS) 0.25 % SOLN Place 1 drop into both eyes 2 (two) times daily.     cholecalciferol (VITAMIN D3) 25 MCG (1000 UNIT) tablet Take 1,000 Units by mouth at bedtime.     clidinium-chlordiazePOXIDE (LIBRAX) 5-2.5 MG capsule Take 1 capsule by mouth 3 (three) times daily as needed (for esophageal symptoms). 90 capsule 1   ezetimibe (ZETIA) 10 MG tablet TAKE 1 TABLET BY MOUTH EVERY DAY 90 tablet 1   Guaifenesin 1200 MG TB12 Take 1,200 mg by mouth daily with supper.     hyoscyamine (LEVSIN SL) 0.125 MG SL tablet USE 1 TABLET UNDER THE TONGUE BEFORE MEALS AND AT BEDTIME AS NEEDED FOR FLARES IN SYMPTOMS, DIARRHEA/ABDOMINAL CRAMPING 270 tablet 1   ipratropium (ATROVENT) 0.06 %  nasal spray SMARTSIG:2 Spray(s) Both Nares Twice Daily PRN     losartan (COZAAR) 50 MG tablet TAKE 1 TABLET BY MOUTH EVERY DAY 90 tablet 3   meclizine (ANTIVERT) 25 MG tablet TAKE 1 TABLET BY MOUTH THREE TIMES A DAY AS NEEDED 30 tablet 1   metFORMIN (GLUCOPHAGE) 500 MG tablet TAKE ONE TABLET BY MOUTH EVERY MORNING ONE AT LUNCH AND 2 AT BEDTIME 360 tablet 1   methenamine (HIPREX) 1 g tablet TAKE 1 TABLET BY MOUTH TWICE A DAY 180  tablet 3   mirabegron ER (MYRBETRIQ) 50 MG TB24 tablet Take 1 tablet (50 mg total) by mouth daily. 90 tablet 3   Multiple Vitamins-Minerals (PRESERVISION AREDS 2) CAPS Take 1 capsule by mouth 2 (two) times daily.      nateglinide (STARLIX) 60 MG tablet Take 60 mg by mouth 2 (two) times daily with a meal.     omega-3 acid ethyl esters (LOVAZA) 1 g capsule Take 2 g by mouth every morning.     Pancrelipase, Lip-Prot-Amyl, (CREON) 24000-76000 units CPEP TAKE 3 CAPSULES THREE TIMES DAILY WITH MEALS AND ONE CAPSULE WITH SNACK TWICE DAILY 1000 capsule 5   RABEprazole (ACIPHEX) 20 MG tablet Take 1 tablet (20 mg total) by mouth daily. 30 tablet 11   No current facility-administered medications for this visit.    Allergies: Allergies  Allergen Reactions   Adhesive [Tape] Other (See Comments)     Blisters    Estrogens Other (See Comments)    Migraines    Sulfonamide Derivatives Rash    Past Medical History:  Diagnosis Date   Arthritis    Benign neoplasm of colon    Constipation    Diaphragmatic hernia without mention of obstruction or gangrene    Diverticulosis of colon (without mention of hemorrhage)    Dysphagia, pharyngoesophageal phase    Esophageal reflux    Essential hypertension    Gastritis    Heart murmur    Hemorrhoids    Hyperlipidemia    Internal hemorrhoids without mention of complication    Interstitial cystitis    Left wrist fracture    PONV (postoperative nausea and vomiting)    PSVT (paroxysmal supraventricular tachycardia)    Stricture and stenosis of esophagus    Type 2 diabetes mellitus (HCC)    Past Surgical History:  Procedure Laterality Date   ABDOMINAL HYSTERECTOMY     BACTERIAL OVERGROWTH TEST N/A 09/06/2015   Procedure: BACTERIAL OVERGROWTH TEST;  Surgeon: Corbin Ade, MD;  [facility]: [facility];  Service: Endoscopy;  Laterality: N/A;  800   BIOPSY  01/02/2022   Procedure: BIOPSY;  Surgeon: Corbin Ade, MD;  Location: [facility];  Service:  Endoscopy;;   BREAST LUMPECTOMY Right    benign   CATARACT EXTRACTION Bilateral 2006   Implants in both, surgeries done 6 weeks apart   CHOLECYSTECTOMY     COLONOSCOPY  03/08/02   Jarold Motto: diverticulosis, internal hemorrhoids, adenomatous colon polyp   COLONOSCOPY  01/23/04   Jarold Motto: diverticulosis, internal hemorrhoids   COLONOSCOPY  09/25/05   Jarold Motto: diverticulosis   COLONOSCOPY  07/05/09   Jarold Motto: severe diverticulosis   COLONOSCOPY  01/21/11   Jarold Motto: severe diverticulosis in sigmoid to desc colon, int hemorrhoids, follow up TCS in 5 years   COLONOSCOPY N/A 04/10/2016   [name]: diverticulosis   CYSTOSCOPY W/ URETERAL STENT PLACEMENT Right 03/15/2021   Procedure: CYSTOSCOPY WITH RETROGRADE PYELOGRAM/URETERAL STENT PLACEMENT;  Surgeon: Jannifer Hick, MD;  Location: [facility];  Service:  Urology;  Laterality: Right;   CYSTOSCOPY WITH RETROGRADE PYELOGRAM, URETEROSCOPY AND STENT PLACEMENT Right 03/29/2021   Procedure: CYSTOSCOPY WITH RETROGRADE PYELOGRAM, URETEROSCOPY AND STENT EXCHANGE;  Surgeon: Malen Gauze, MD;  Location: [facility];  Service: Urology;  Laterality: Right;   DILATION AND CURETTAGE OF UTERUS     ESOPHAGEAL MANOMETRY  03/21/08   Patterson: findings c/w [place]acker Esophagus   ESOPHAGOGASTRODUODENOSCOPY  03/08/02   Jarold Motto: esophageal stricture, chronic gerd, s/p dilation.    ESOPHAGOGASTRODUODENOSCOPY  01/23/04   Jarold Motto: esophageal stricture, gastritis, hiatal hernia   ESOPHAGOGASTRODUODENOSCOPY  09/25/05   Jarold Motto: gastritis, benign bx, no H.pylori   ESOPHAGOGASTRODUODENOSCOPY  07/05/09   Jarold Motto: gastropathy, benign small bowel bx and gastric bx   ESOPHAGOGASTRODUODENOSCOPY N/A 05/15/2015   Dr. Jena Gauss- diffuse moderate inflammation characterized by congestion (edema), erythema, and linear erosions was found in the entire examined stomach. bx= reactive gastropathy   ESOPHAGOGASTRODUODENOSCOPY (EGD) WITH PROPOFOL N/A 01/02/2022   Procedure:  ESOPHAGOGASTRODUODENOSCOPY (EGD) WITH PROPOFOL;  Surgeon: Corbin Ade, MD;  [facility];  Service: Endoscopy;  Laterality: N/A;  10:30am, asa 2   FOOT SURGERY Left    HEMORRHOID BANDING  2017   Dr.[name]   HOLMIUM LASER APPLICATION Right 03/29/2021   Procedure: HOLMIUM LASER APPLICATION;  Surgeon: Malen Gauze, MD;  Location: [facility];  Service: Urology;  Laterality: Right;   LAPAROSCOPIC VAGINAL HYSTERECTOMY     MALONEY DILATION N/A 01/02/2022   Procedure: Elease Hashimoto DILATION;  Surgeon: Corbin Ade, MD;  [facility]: [facility];  Service: Endoscopy;  Laterality: N/A;   ORIF WRIST FRACTURE Left 06/23/2018   Procedure: OPEN REDUCTION INTERNAL FIXATION (ORIF) LEFT WRIST FRACTURE;  Surgeon: Sheral Apley, MD;  Location: [facility];  Service: Orthopedics;  Laterality: Left;  regional arm block   RECTOCELE REPAIR     TOTAL KNEE ARTHROPLASTY Right    Family History  Problem Relation Age of Onset   Ovarian cancer Mother    Diabetes Father    Heart disease Father    Breast cancer Sister    Liver cancer Sister    Colon cancer Cousin        Paternal side   Diabetes Maternal Aunt    Liver disease Cousin        Maternal side, never drank   Social History   Socioeconomic History   Marital status: Widowed    Spouse name: Not on file   Number of children: 3   Years of education: Not on file   Highest education level: Not on file  Occupational History   Occupation: Retired  Tobacco Use   Smoking status: Never   Smokeless tobacco: Never   Tobacco comments:    Never smoked  Vaping Use   Vaping status: Never Used  Substance and Sexual Activity   Alcohol use: No    Alcohol/week: 0.0 standard drinks of alcohol   Drug use: No   Sexual activity: Not Currently  Other Topics Concern   Not on file  Social History Narrative   Widowed since 07/2018. Married x 61 years.   3 sons. 84, 1963 and 1966.   Social Determinants of Health   Financial Resource  Strain: Low Risk  (05/31/2022)   Overall Financial Resource Strain (CARDIA)    Difficulty of Paying Living Expenses: Not hard at all  Food Insecurity: No Food Insecurity (05/31/2022)   Hunger Vital Sign    Worried About Running Out of Food in the Last Year: Never true  Ran Out of Food in the Last Year: Never true  Transportation Needs: No Transportation Needs (05/31/2022)   PRAPARE - Administrator, Civil Service (Medical): No    Lack of Transportation (Non-Medical): No  Physical Activity: Sufficiently Active (05/31/2022)   Exercise Vital Sign    Days of Exercise per Week: 5 days    Minutes of Exercise per Session: 30 min  Stress: No Stress Concern Present (05/31/2022)   [employer] of Occupational Health - Occupational Stress Questionnaire    Feeling of Stress : Not at all  Social Connections: Moderately Integrated (05/31/2022)   Social Connection and Isolation Panel [NHANES]    Frequency of Communication with Friends and Family: More than three times a week    Frequency of Social Gatherings with Friends and Family: More than three times a week    Attends Religious Services: More than 4 times per year    Active Member of [employer] or Organizations: Yes    Attends Banker Meetings: More than 4 times per year    Marital Status: Widowed  Intimate Partner Violence: Not At Risk (05/31/2022)   Humiliation, Afraid, Rape, and Kick questionnaire    Fear of Current or Ex-Partner: No    Emotionally Abused: No    Physically Abused: No    Sexually Abused: No    SUBJECTIVE  Review of Systems Constitutional: Patient denies any unintentional weight loss or change in strength lntegumentary: Patient denies any rashes or pruritus Eyes: Patient reports eye problems Cardiovascular: Patient denies chest pain or syncope Respiratory: Patient denies shortness of breath Gastrointestinal: As per HPI  Musculoskeletal: Patient reports chronic low back pain & bilateral hip  pain Neurologic: Patient denies convulsions or seizures Psychiatric: Patient reports stress & frustration; denies anxiety or depression Allergic/Immunologic: Patient denies recent allergic reaction(s) Hematologic/Lymphatic: Patient denies bleeding tendencies Endocrine: Patient denies heat/cold intolerance  GU: As per HPI.  OBJECTIVE Vitals:   10/30/22 0915  BP: (!) 157/79  Pulse: 82  Temp: 97.7 F (36.5 C)   There is no height or weight on file to calculate BMI.  Physical Examination  Constitutional: No obvious distress; patient is non-toxic appearing  Cardiovascular: No visible lower extremity edema.  Respiratory: The patient does not have audible wheezing/stridor; respirations do not appear labored  Gastrointestinal: Abdomen non-distended Musculoskeletal: Normal ROM of UEs  Skin: No obvious rashes/open sores  Neurologic: CN 2-12 grossly intact Psychiatric: Answered questions appropriately with normal affect  Hematologic/Lymphatic/Immunologic: No obvious bruises or sites of spontaneous bleeding  UA: 11-30 WBC/hpf, 0-2 RBC/hpf, no bacteria PVR: 0 ml  ASSESSMENT Interstitial cystitis - Plan: Urinalysis, Routine w reflex microscopic, BLADDER SCAN AMB NON-IMAGING, baclofen (LIORESAL) 10 MG tablet  High-tone pelvic floor dysfunction in female - Plan: baclofen (LIORESAL) 10 MG tablet  We discussed her complex medical history and how the burden of multiple physical, socioeconomic, and environmental factors and negative effect one's mental health, which can then subsequently negatively impact physical health in multiple ways due to activation of the sympathetic nervous system resulting in elevated cortisol (which is pro-inflammatory, can impair immune function, etc.), adrenaline (which can worsen insomnia), etc. We discussed how that can correlate with impaired pain management. She was advised to consider discussing her mental health with her PCP due to her report of persistent stress  and frustration.  She was reassured that scar tissue does not grow continuously over time. We discussed however that the presence of scarring / adhesions can be a potential contributing factor for lower  abdominal / pelvic pain. We discussed high co-occurrence rate of high-tone pelvic floor dysfunction with IC and possibly related to prior pelvic surgical history including her rectocele repair. We reviewed the diagnosis of high-tone pelvic floor dysfunction and how it can contribute to chronic pelvic pain, voiding dysfunction, defecatory dysfunction (such as her chronic constipation with hemorrhoids, which may be secondary to straining). We discussed management options including relaxation techniques, muscle relaxers, and/or pelvic floor physical therapy. Patient elected to start Baclofen 10 mg nightly. We discussed side effects including sedation, which actually may be helpful in her case due to reported insomnia. We discussed how pelvic floor physical therapy may be helpful via modalities for improved tissue mobilization, myofascial release, etc. She declined PT at this time (didn't think it helped much previously).   We reviewed her current IC treatment regimen. She is taking OAB-type medication for urgency/frequency and receives bladder instillations PRN; not taking any daily neuropathic pain medications or antihistamines at this time, which are commonly used for IC management. Discussed option to start Amitriptyline 25 mg nightly or Hydroxyzine 25 mg nightly - ultimately she elected to hold off on those but may consider starting one or both of those if symptoms fail to improve with Baclofen alone.  Offered follow up in 6-12 weeks for symptom recheck; she elected to keep follow up as scheduled with Dr. Ronne Binning on 05/07/2023 for now. Next nurse visit for [facility] bladder instillation is scheduled for Monday 11/11/2022.  Pt verbalized understanding and agreement. All questions were answered.   PLAN Advised  the following: Start Baclofen 10 mg nightly for pelvic floor muscle relaxation. Continue all other medications as prescribed. 3. Return for as previously scheduled.  Orders Placed This Encounter  Procedures   Urinalysis, Routine w reflex microscopic   BLADDER SCAN AMB NON-IMAGING    It has been explained that the patient is to follow regularly with their PCP in addition to all other providers involved in their care and to follow instructions provided by these respective offices. Patient advised to contact urology clinic if any urologic-pertaining questions, concerns, new symptoms or problems arise in the interim period.  There are no Patient Instructions on file for this visit.  Electronically signed by:  Donnita Falls, MSN, FNP-C, CUNP 10/30/2022 10:46 AM

## 2022-10-29 NOTE — Telephone Encounter (Signed)
Patient called she is having bladder spasms, burning and frequent urination , she wants to drop of a urine sample..  Please call

## 2022-10-30 ENCOUNTER — Encounter: Payer: Self-pay | Admitting: Urology

## 2022-10-30 ENCOUNTER — Ambulatory Visit (INDEPENDENT_AMBULATORY_CARE_PROVIDER_SITE_OTHER): Payer: Medicare Other | Admitting: Urology

## 2022-10-30 VITALS — BP 157/79 | HR 82 | Temp 97.7°F

## 2022-10-30 DIAGNOSIS — N3010 Interstitial cystitis (chronic) without hematuria: Secondary | ICD-10-CM

## 2022-10-30 DIAGNOSIS — M6289 Other specified disorders of muscle: Secondary | ICD-10-CM

## 2022-10-30 LAB — URINALYSIS, ROUTINE W REFLEX MICROSCOPIC
Bilirubin, UA: NEGATIVE
Glucose, UA: NEGATIVE
Ketones, UA: NEGATIVE
Nitrite, UA: NEGATIVE
Protein,UA: NEGATIVE
RBC, UA: NEGATIVE
Specific Gravity, UA: 1.015 (ref 1.005–1.030)
Urobilinogen, Ur: 0.2 mg/dL (ref 0.2–1.0)
pH, UA: 6 (ref 5.0–7.5)

## 2022-10-30 LAB — MICROSCOPIC EXAMINATION: Bacteria, UA: NONE SEEN

## 2022-10-30 LAB — BLADDER SCAN AMB NON-IMAGING: Scan Result: 0

## 2022-10-30 MED ORDER — BACLOFEN 10 MG PO TABS
10.0000 mg | ORAL_TABLET | Freq: Every evening | ORAL | 5 refills | Status: DC
Start: 2022-10-30 — End: 2022-12-23

## 2022-10-31 NOTE — Telephone Encounter (Signed)
noted 

## 2022-10-31 NOTE — Telephone Encounter (Signed)
Supplement is [employer]' Colon Health probiotic with lactobacillus and Bifidobacterium. Continue daily.  She probably does have scar tissue but unlikely to be source of fecal incontinence. It would be really hard to prove one way or the other as typically does not show up on imaging. Recommend sigmoidoscopy as planned.

## 2022-11-01 NOTE — Telephone Encounter (Signed)
Pt was made aware and verbalized understanding.

## 2022-11-11 ENCOUNTER — Ambulatory Visit (INDEPENDENT_AMBULATORY_CARE_PROVIDER_SITE_OTHER): Payer: Medicare Other

## 2022-11-11 DIAGNOSIS — N3010 Interstitial cystitis (chronic) without hematuria: Secondary | ICD-10-CM

## 2022-11-11 MED ORDER — LIDOCAINE HCL 2 % IJ SOLN
10.0000 mL | Freq: Once | INTRAMUSCULAR | Status: AC
Start: 2022-11-11 — End: 2022-11-11
  Administered 2022-11-11: 200 mg

## 2022-11-11 MED ORDER — SODIUM BICARBONATE 8.4 % IV SOLN
50.0000 meq | Freq: Once | INTRAVENOUS | Status: AC
Start: 2022-11-11 — End: 2022-11-11
  Administered 2022-11-11: 50 meq

## 2022-11-11 MED ORDER — HYDROCORTISONE SOD SUC (PF) 100 MG IJ SOLR
100.0000 mg | Freq: Once | INTRAMUSCULAR | Status: AC
Start: 2022-11-11 — End: 2022-11-11
  Administered 2022-11-11: 100 mg

## 2022-11-11 MED ORDER — DIMETHYL SULFOXIDE 50 % IS SOLN
50.0000 mL | Freq: Once | INTRAVESICAL | Status: AC
Start: 2022-11-11 — End: 2022-11-11
  Administered 2022-11-11: 50 mL via URETHRAL

## 2022-11-11 NOTE — Progress Notes (Signed)
Bladder Instillation DMSO  Due to Interstitial cystitis  patient is present today for a Bladder Instillation of DMSO treatment. Patient was cleaned and prepped in a sterile fashion with betadine.  A 20FR catheter was inserted, urine return was noted , urine was yellow in color.  DMSO treatment was instilled into the bladder. The catheter was then removed. Patient tolerated well, no complications were noted pt was instructed to hold the instillation for 20 minutes and then will void it out. Pt voiced understanding.    Preformed by: Kennyth Lose, CMA  Follow up/ Additional notes: N/A

## 2022-11-18 ENCOUNTER — Encounter (HOSPITAL_COMMUNITY)
Admission: RE | Disposition: A | Discharge status: Home / Self Care | Payer: Self-pay | Source: Ambulatory Visit | Attending: Internal Medicine

## 2022-11-18 ENCOUNTER — Ambulatory Visit (HOSPITAL_COMMUNITY): Payer: Medicare Other | Admitting: Anesthesiology

## 2022-11-18 ENCOUNTER — Ambulatory Visit (HOSPITAL_COMMUNITY)
Admission: RE | Admit: 2022-11-18 | Discharge: 2022-11-18 | Disposition: A | Discharge status: Home / Self Care | Payer: Medicare Other | Source: Ambulatory Visit | Attending: Internal Medicine | Admitting: Internal Medicine

## 2022-11-18 ENCOUNTER — Encounter (HOSPITAL_COMMUNITY): Payer: Self-pay | Admitting: Internal Medicine

## 2022-11-18 ENCOUNTER — Other Ambulatory Visit: Payer: Self-pay

## 2022-11-18 DIAGNOSIS — K641 Second degree hemorrhoids: Secondary | ICD-10-CM | POA: Diagnosis not present

## 2022-11-18 DIAGNOSIS — Z7984 Long term (current) use of oral hypoglycemic drugs: Secondary | ICD-10-CM

## 2022-11-18 DIAGNOSIS — K589 Irritable bowel syndrome without diarrhea: Secondary | ICD-10-CM | POA: Diagnosis not present

## 2022-11-18 DIAGNOSIS — K6289 Other specified diseases of anus and rectum: Secondary | ICD-10-CM | POA: Diagnosis not present

## 2022-11-18 DIAGNOSIS — K219 Gastro-esophageal reflux disease without esophagitis: Secondary | ICD-10-CM | POA: Insufficient documentation

## 2022-11-18 DIAGNOSIS — K625 Hemorrhage of anus and rectum: Secondary | ICD-10-CM

## 2022-11-18 DIAGNOSIS — E119 Type 2 diabetes mellitus without complications: Secondary | ICD-10-CM

## 2022-11-18 DIAGNOSIS — G709 Myoneural disorder, unspecified: Secondary | ICD-10-CM | POA: Diagnosis not present

## 2022-11-18 DIAGNOSIS — Z8249 Family history of ischemic heart disease and other diseases of the circulatory system: Secondary | ICD-10-CM | POA: Diagnosis not present

## 2022-11-18 DIAGNOSIS — K861 Other chronic pancreatitis: Secondary | ICD-10-CM | POA: Diagnosis not present

## 2022-11-18 DIAGNOSIS — R151 Fecal smearing: Secondary | ICD-10-CM | POA: Insufficient documentation

## 2022-11-18 DIAGNOSIS — Z833 Family history of diabetes mellitus: Secondary | ICD-10-CM | POA: Insufficient documentation

## 2022-11-18 DIAGNOSIS — I10 Essential (primary) hypertension: Secondary | ICD-10-CM | POA: Diagnosis not present

## 2022-11-18 DIAGNOSIS — K621 Rectal polyp: Secondary | ICD-10-CM

## 2022-11-18 DIAGNOSIS — K5730 Diverticulosis of large intestine without perforation or abscess without bleeding: Secondary | ICD-10-CM | POA: Diagnosis not present

## 2022-11-18 DIAGNOSIS — Z9889 Other specified postprocedural states: Secondary | ICD-10-CM | POA: Diagnosis not present

## 2022-11-18 HISTORY — PX: BIOPSY: SHX5522

## 2022-11-18 HISTORY — PX: FLEXIBLE SIGMOIDOSCOPY: SHX5431

## 2022-11-18 LAB — GLUCOSE, CAPILLARY: Glucose-Capillary: 132 mg/dL — ABNORMAL HIGH (ref 70–99)

## 2022-11-18 SURGERY — SIGMOIDOSCOPY, FLEXIBLE
Anesthesia: General

## 2022-11-18 MED ORDER — PROPOFOL 10 MG/ML IV BOLUS
INTRAVENOUS | Status: DC | PRN
  Administered 2022-11-18 (×2): 50 mg via INTRAVENOUS

## 2022-11-18 MED ORDER — LIDOCAINE HCL (CARDIAC) PF 100 MG/5ML IV SOSY
PREFILLED_SYRINGE | INTRAVENOUS | Status: DC | PRN
  Administered 2022-11-18: 50 mg via INTRAVENOUS

## 2022-11-18 MED ORDER — LACTATED RINGERS IV SOLN: INTRAVENOUS | Status: DC

## 2022-11-18 NOTE — Anesthesia Preprocedure Evaluation (Signed)
Anesthesia Evaluation  Patient identified by MRN, date of birth, ID band Patient awake    Reviewed: Allergy & Precautions, H&P , NPO status , Patient's Chart, lab work & pertinent test results, reviewed documented beta blocker date and time   History of Anesthesia Complications (+) PONV and history of anesthetic complications  Airway Mallampati: II  TM Distance: >3 FB Neck ROM: full    Dental no notable dental hx.    Pulmonary neg pulmonary ROS   Pulmonary exam normal breath sounds clear to auscultation       Cardiovascular Exercise Tolerance: Good hypertension, negative cardio ROS + Valvular Problems/Murmurs  Rhythm:regular Rate:Normal     Neuro/Psych  Neuromuscular disease negative neurological ROS  negative psych ROS   GI/Hepatic negative GI ROS, Neg liver ROS,GERD  ,,  Endo/Other  negative endocrine ROSdiabetes    Renal/GU Renal diseasenegative Renal ROS  negative genitourinary   Musculoskeletal   Abdominal   Peds  Hematology negative hematology ROS (+)   Anesthesia Other Findings   Reproductive/Obstetrics negative OB ROS                             Anesthesia Physical Anesthesia Plan  ASA: 3  Anesthesia Plan: General   Post-op Pain Management:    Induction:   PONV Risk Score and Plan: Propofol infusion  Airway Management Planned:   Additional Equipment:   Intra-op Plan:   Post-operative Plan:   Informed Consent: I have reviewed the patients History and Physical, chart, labs and discussed the procedure including the risks, benefits and alternatives for the proposed anesthesia with the patient or authorized representative who has indicated his/her understanding and acceptance.     Dental Advisory Given  Plan Discussed with: CRNA  Anesthesia Plan Comments:        Anesthesia Quick Evaluation

## 2022-11-18 NOTE — Anesthesia Postprocedure Evaluation (Signed)
Anesthesia Post Note  Patient: Deborah Gray  Procedure(s) Performed: FLEXIBLE SIGMOIDOSCOPY BIOPSY  Patient location during evaluation: PACU Anesthesia Type: General Level of consciousness: awake and alert Pain management: pain level controlled Vital Signs Assessment: post-procedure vital signs reviewed and stable Respiratory status: spontaneous breathing, nonlabored ventilation, respiratory function stable and patient connected to nasal cannula oxygen Cardiovascular status: blood pressure returned to baseline and stable Postop Assessment: no apparent nausea or vomiting Anesthetic complications: no   There were no known notable events for this encounter.   Last Vitals:  Vitals:   11/18/22 0935 11/18/22 1042  BP: (!) 150/61 (!) 112/49  Pulse: 82 82  Resp: 17 17  Temp: 36.6 C 36.5 C  SpO2: 98% 98%    Last Pain:  Vitals:   11/18/22 1042  TempSrc: Axillary  PainSc: 0-No pain                 Kemoni Quesenberry L Ily Denno

## 2022-11-18 NOTE — Interval H&P Note (Signed)
History and Physical Interval Note:  11/18/2022 10:13 AM  Deborah Gray  has presented today for surgery, with the diagnosis of rectal bleeding,rectal polyp.  The various methods of treatment have been discussed with the patient and family. After consideration of risks, benefits and other options for treatment, the patient has consented to  Procedure(s) with comments: FLEXIBLE SIGMOIDOSCOPY (N/A) - 10:00 am, asa 2 as a surgical intervention.  The patient's history has been reviewed, patient examined, no change in status, stable for surgery.  I have reviewed the patient's chart and labs.  Questions were answered to the patient's satisfaction.     Eula Listen    Essentially no change since she was seen in the office.  Sigmoidoscopy per plan.The risks, benefits, limitations, alternatives and imponderables have been reviewed with the patient. Questions have been answered. All parties are agreeable.

## 2022-11-18 NOTE — Discharge Instructions (Addendum)
    Sigmoidoscopy Discharge Instructions  Read the instructions outlined below and refer to this sheet in the next few weeks. These discharge instructions provide you with general information on caring for yourself after you leave the hospital. Your doctor may also give you specific instructions. While your treatment has been planned according to the most current medical practices available, unavoidable complications occasionally occur. If you have any problems or questions after discharge, call Dr. Jena Gauss at [phone]. ACTIVITY You may resume your regular activity, but move at a slower pace for the next 24 hours.  Take frequent rest periods for the next 24 hours.  Walking will help get rid of the air and reduce the bloated feeling in your belly (abdomen).  No driving for 24 hours (because of the medicine (anesthesia) used during the test).   Do not sign any important legal documents or operate any machinery for 24 hours (because of the anesthesia used during the test).  NUTRITION Drink plenty of fluids.  You may resume your normal diet as instructed by your doctor.  Begin with a light meal and progress to your normal diet. Heavy or fried foods are harder to digest and may make you feel sick to your stomach (nauseated).  Avoid alcoholic beverages for 24 hours or as instructed.  MEDICATIONS You may resume your normal medications unless your doctor tells you otherwise.  WHAT YOU CAN EXPECT TODAY Some feelings of bloating in the abdomen.  Passage of more gas than usual.  Spotting of blood in your stool or on the toilet paper.  IF YOU HAD POLYPS REMOVED DURING THE COLONOSCOPY: No aspirin products for 7 days or as instructed.  No alcohol for 7 days or as instructed.  Eat a soft diet for the next 24 hours.  FINDING OUT THE RESULTS OF YOUR TEST Not all test results are available during your visit. If your test results are not back during the visit, make an appointment with your caregiver to find out  the results. Do not assume everything is normal if you have not heard from your caregiver or the medical facility. It is important for you to follow up on all of your test results.  SEEK IMMEDIATE MEDICAL ATTENTION IF: You have more than a spotting of blood in your stool.  Your belly is swollen (abdominal distention).  You are nauseated or vomiting.  You have a temperature over 101.  You have abdominal pain or discomfort that is severe or gets worse throughout the day.     scar in rectum likely due to prior hemorrhoid banding.   No polyp or tumor seen.  Biopsies taken.   Office visit with [place] again 6 to 8 weeks Lewie Loron) OFFICE TO CALL WITH APPOINTMENT   no change in medical regimen at this time.  Further recommendations to follow pending review of pathology report   at patient request, I called Rosalita Carey at [phone] 2-reviewed findings and recommendations

## 2022-11-18 NOTE — Transfer of Care (Signed)
Immediate Anesthesia Transfer of Care Note  Patient: Deborah Gray  Procedure(s) Performed: FLEXIBLE SIGMOIDOSCOPY  Patient Location: Short Stay  Anesthesia Type:General  Level of Consciousness: awake, alert , oriented, and patient cooperative  Airway & Oxygen Therapy: Patient Spontanous Breathing  Post-op Assessment: Report given to RN, Post -op Vital signs reviewed and stable, and Patient moving all extremities X 4  Post vital signs: Reviewed and stable  Last Vitals:  Vitals Value Taken Time  BP    Temp    Pulse    Resp    SpO2      Last Pain:  Vitals:   11/18/22 1024  TempSrc:   PainSc: 0-No pain      Patients Stated Pain Goal: 7 (11/18/22 1024)  Complications: No notable events documented.

## 2022-11-18 NOTE — Op Note (Signed)
[facility] Patient Name: [place] Procedure Date: 11/18/2022 10:01 AM MRN: [id number] Date of Birth: 1934-01-08 Attending MD: Gennette Pac , MD, [id number] CSN: [id number] Age: 89 Admit Type: Outpatient Procedure:                Flexible Sigmoidoscopy Indications:              Abnormal DRE with change in bowel habits                           Rectal hemorrhage Providers:                Gennette Pac, MD, Nena Polio, RN, Lafonda Mosses, Technician Referring MD:              Medicines:                Propofol per Anesthesia Complications:            No immediate complications. Estimated Blood Loss:     Estimated blood loss was minimal. Estimated blood                            loss was minimal. Procedure:                Pre-Anesthesia Assessment:                           - Prior to the procedure, a History and Physical                            was performed, and patient medications and                            allergies were reviewed. The patient's tolerance of                            previous anesthesia was also reviewed. The risks                            and benefits of the procedure and the sedation                            options and risks were discussed with the patient.                            All questions were answered, and informed consent                            was obtained. Prior Anticoagulants: The patient has                            taken no anticoagulant or antiplatelet agents. ASA  Grade Assessment: II - A patient with mild systemic                            disease. After reviewing the risks and benefits,                            the patient was deemed in satisfactory condition to                            undergo the procedure.                           After obtaining informed consent, the scope was                            passed under direct  vision. The [id number])                            scope was introduced through the anus and advanced                            to the the sigmoid colon. The flexible                            sigmoidoscopy was accomplished without difficulty.                            The patient tolerated the procedure well. The                            quality of the bowel preparation was adequate. Scope In: 10:29:24 AM Scope Out: 10:36:47 AM Total Procedure Duration: 0 hours 7 minutes 23 seconds  Findings:      Digital rectal examination did reveal some semicircumferential       nodularity on palpation. Endoscopic findings apparent scar involving       approximately a third of the circumferential distal rectum 2 cm up from       the anal verge. More likely indicative of prior placement of 3       hemorrhoid bands. Less likely stercoral ulceration no neoplasm/polyp.       Scope advanced to the mid sigmoid where I ran into formed stool. There       were distal sigmoid diverticula present. Retroflexion view of the anal       verge demonstrated no abnormalities. If scars were site of prior band       placement they appear to have been placed a little bit proximal to the       hemorrhoids. Patient appeared to have a minimal grade 2 hemorrhoids.       Biopsy of the rectal scar and rectal mucosa were taken separately Impression:               Rectal scar as described. Status post biopsy.                            Minimal grade 2 hemorrhoids. Distal sigmoid  diverticulosis.                           - No specimens collected. Moderate Sedation:      Moderate (conscious) sedation was personally administered by an       anesthesia professional. The following parameters were monitored: oxygen       saturation, heart rate, blood pressure, respiratory rate, EKG, adequacy       of pulmonary ventilation, and response to care. Recommendation:           - Discharge patient to  home (ambulatory). Follow-up                            on pathology. Office visit with [place] in 2 months. Procedure Code(s):        --- Professional ---                           [phone], Sigmoidoscopy, flexible; diagnostic,                            including collection of specimen(s) by brushing or                            washing, when performed (separate procedure) Diagnosis Code(s):        --- Professional ---                           K62.5, Hemorrhage of anus and rectum CPT copyright 2022 American Medical Association. All rights reserved. The codes documented in this report are preliminary and upon coder review may  be revised to meet current compliance requirements. Gerrit Friends. Karene Bracken, MD Gennette Pac, MD 11/18/2022 11:07:15 AM This report has been signed electronically. Number of Addenda: 0

## 2022-11-20 ENCOUNTER — Ambulatory Visit: Payer: Medicare Other

## 2022-11-20 ENCOUNTER — Telehealth: Payer: Self-pay

## 2022-11-20 DIAGNOSIS — Z23 Encounter for immunization: Secondary | ICD-10-CM

## 2022-11-20 LAB — SURGICAL PATHOLOGY

## 2022-11-20 NOTE — Telephone Encounter (Signed)
Pt called stating that she has been having some abd burning and bloating off and on that started Sunday. Pt states that mylanta and tums has helped calm it. Pt states that she also passed a bunch of hard balls of stool last night and then this morning woke up with diarrhea. Pt states that she hasn't had any diarrhea this afternoon but that she has had a dull pain in her abdomen. Pt is wanting to know what you think she should do.

## 2022-11-20 NOTE — Telephone Encounter (Signed)
She had flex sig on Monday. I don't think she should have much discomfort from the rectal biopsies but I will forward to dr. Jena Gauss so he is aware incase other advise to be given.  I would suggest she try to increase her benefiber to twice a day. Sounds like she is having some constipation with possible overflow diarrhea given hard balls. If that does not help, then she may require low dose miralax (1/2 capful daily).

## 2022-11-21 NOTE — Telephone Encounter (Signed)
Pt states that she hasn't had any abdominal pain or diarrhea so far today, she states that she is sore across her midriff but that she has been able to eat breakfast and lunch without any bouts of abdominal pain or diarrhea so far. Pt is scheduled for a f/u on 11/04

## 2022-11-21 NOTE — Telephone Encounter (Signed)
Noted  

## 2022-11-24 ENCOUNTER — Other Ambulatory Visit: Payer: Self-pay | Admitting: Family Medicine

## 2022-11-24 DIAGNOSIS — E1100 Type 2 diabetes mellitus with hyperosmolarity without nonketotic hyperglycemic-hyperosmolar coma (NKHHC): Secondary | ICD-10-CM

## 2022-11-25 ENCOUNTER — Encounter: Payer: Self-pay | Admitting: Internal Medicine

## 2022-11-25 ENCOUNTER — Encounter (HOSPITAL_COMMUNITY): Payer: Self-pay | Admitting: Internal Medicine

## 2022-11-26 DIAGNOSIS — H34832 Tributary (branch) retinal vein occlusion, left eye, with macular edema: Secondary | ICD-10-CM | POA: Diagnosis not present

## 2022-11-27 ENCOUNTER — Telehealth: Payer: Self-pay

## 2022-11-27 NOTE — Telephone Encounter (Signed)
Pt called stating that she started having diarrhea again Sat and has had to take a levsin sun, mon, two yesterday and one so far today. Pt states that she bought some fibercon and on the bottle it states that it also has probiotic in it and she wanted to also make sure she wasn't taking too much probiotic since she was also taking a probiotic as well as the fibercon. Pt is wanting to know what to do about the diarrhea. Pt states that she is trying no to take imodium. Please advise.

## 2022-11-28 NOTE — Telephone Encounter (Signed)
I would suggest reducing the benefiber dose by half of what she is currently taking.   Ok to use levsin 2-3 times per day as needed. Hopefully stools will settle back down.   I don't think she is getting too much probiotics

## 2022-11-28 NOTE — Telephone Encounter (Signed)
Tammy, please ask patient:  -how many stools a day she has been having with the diarrhea?  -Is she taking the Fibercon INSTEAD of the benefiber we discussed at her ov? Previously she said the benefiber has made her have diarrhea but I had put her back on it at last ov because she was not having good complete stools. If she feels the diarrhea got worse with benefiber, she can hold benefiber.  -any recent antibiotics?

## 2022-11-28 NOTE — Telephone Encounter (Signed)
Pt states she was mostly having them at night and it would be 3 or 4 a night. Pt states that she misspoke that it is benefiber not fibercon that she is taking. Pt states that she is feeling a little better today. She hasn't been on any recent antibiotics and doesn't feel like it got worse when she started the benefiber.

## 2022-11-28 NOTE — Telephone Encounter (Signed)
Pt was made aware and verbalized understanding.  

## 2022-12-04 ENCOUNTER — Ambulatory Visit (INDEPENDENT_AMBULATORY_CARE_PROVIDER_SITE_OTHER): Payer: Medicare Other | Admitting: Family Medicine

## 2022-12-04 VITALS — BP 130/76 | HR 85 | Ht 64.0 in | Wt 141.2 lb

## 2022-12-04 DIAGNOSIS — M25562 Pain in left knee: Secondary | ICD-10-CM

## 2022-12-04 MED ORDER — TRAMADOL HCL 50 MG PO TABS: 50.0000 mg | ORAL_TABLET | Freq: Two times a day (BID) | ORAL | 0 refills | Status: AC | PRN

## 2022-12-04 NOTE — Patient Instructions (Signed)
Referral is in.  [facility] OFFICE [address] [place], [place] [address] [phone]

## 2022-12-05 DIAGNOSIS — M25562 Pain in left knee: Secondary | ICD-10-CM | POA: Insufficient documentation

## 2022-12-05 NOTE — Assessment & Plan Note (Signed)
Has known underlying osteoarthritis.  Tramadol as directed.  Referring to orthopedics per patient request.

## 2022-12-05 NOTE — Progress Notes (Signed)
Subjective:  Patient ID: Deborah Gray, female    DOB: May 10, 1933  Age: 89 y.o. MRN: [id number]  CC:  Left knee pain   HPI:  89 year old female presents with left knee pain.  Patient reports that she has had injections in this knee previously.  She states that she has recently been experiencing severe left knee pain and decreased range of motion.  Patient has taken some over-the-counter medication with improvement.  She has been seen by orthopedics at [facility] previously.  She would like to stay local.  They have an office in the evening.  She would like a referral.  No recent fall, trauma, injury.  Patient Active Problem List   Diagnosis Date Noted   Acute pain of left knee 12/05/2022   Interstitial cystitis 10/24/2022   History of recurrent UTIs 10/24/2022   Bilateral renal cysts 10/24/2022   Osteoporosis 07/05/2022   Multiple pulmonary nodules determined by computed tomography of lung 03/21/2022   Nephrolithiasis 03/15/2021   Prolapsed internal hemorrhoids, grade 3 01/23/2021   Spinal stenosis of lumbar region 12/02/2017   Scoliosis deformity of spine 12/02/2017   Osteoarthritis of left knee 12/02/2017   Degeneration of lumbar intervertebral disc 11/18/2017   Hemorrhoid 04/18/2015   IBS (irritable bowel syndrome) 01/30/2015   Type 2 diabetes mellitus (HCC) 12/22/2014   Hyperlipidemia, unspecified 12/22/2014   Essential hypertension 11/25/2013   Diverticulosis 01/27/2012   H/O esophageal spasm 01/27/2012   Constipation 01/27/2012   Chronic pancreatitis (HCC) 02/26/2011   Gastroesophageal reflux disease 01/18/2011   DJD (degenerative joint disease) 11/29/2010    Social Hx   Social History   Socioeconomic History   Marital status: Widowed    Spouse name: Not on file   Number of children: 3   Years of education: Not on file   Highest education level: Not on file  Occupational History   Occupation: Retired  Tobacco Use   Smoking status: Never   Smokeless  tobacco: Never   Tobacco comments:    Never smoked  Vaping Use   Vaping status: Never Used  Substance and Sexual Activity   Alcohol use: No    Alcohol/week: 0.0 standard drinks of alcohol   Drug use: No   Sexual activity: Not Currently  Other Topics Concern   Not on file  Social History Narrative   Widowed since 07/2018. Married x 61 years.   3 sons. 52, 1963 and 1966.   Social Determinants of Health   Financial Resource Strain: Low Risk  (05/31/2022)   Overall Financial Resource Strain (CARDIA)    Difficulty of Paying Living Expenses: Not hard at all  Food Insecurity: No Food Insecurity (05/31/2022)   Hunger Vital Sign    Worried About Running Out of Food in the Last Year: Never true    Ran Out of Food in the Last Year: Never true  Transportation Needs: No Transportation Needs (05/31/2022)   PRAPARE - Administrator, Civil Service (Medical): No    Lack of Transportation (Non-Medical): No  Physical Activity: Sufficiently Active (05/31/2022)   Exercise Vital Sign    Days of Exercise per Week: 5 days    Minutes of Exercise per Session: 30 min  Stress: No Stress Concern Present (05/31/2022)   [employer] of Occupational Health - Occupational Stress Questionnaire    Feeling of Stress : Not at all  Social Connections: Moderately Integrated (05/31/2022)   Social Connection and Isolation Panel [NHANES]    Frequency of Communication with Friends  and Family: More than three times a week    Frequency of Social Gatherings with Friends and Family: More than three times a week    Attends Religious Services: More than 4 times per year    Active Member of Clubs or Organizations: Yes    Attends Banker Meetings: More than 4 times per year    Marital Status: Widowed    Review of Systems Per HPI  Objective:  BP 130/76   Pulse 85   Ht 5\' 4"  (1.626 m)   Wt 141 lb 3.2 oz (64 kg)   SpO2 98%   BMI 24.24 kg/m      12/04/2022   11:09 AM 11/18/2022   10:42  AM 11/18/2022    9:35 AM  BP/Weight  Systolic BP 130 112 150  Diastolic BP 76 49 61  Wt. (Lbs) 141.2    BMI 24.24 kg/m2      Physical Exam Constitutional:      General: She is not in acute distress.    Appearance: Normal appearance.  HENT:     Head: Normocephalic and atraumatic.  Pulmonary:     Effort: Pulmonary effort is normal. No respiratory distress.  Musculoskeletal:     Comments: Left knee -no current joint line tenderness.  Mild swelling.  Neurological:     Mental Status: She is alert.  Psychiatric:        Mood and Affect: Mood normal.        Behavior: Behavior normal.     Lab Results  Component Value Date   WBC 6.7 04/08/2022   HGB 13.2 04/08/2022   HCT 39.9 04/08/2022   PLT 249 04/08/2022   GLUCOSE 139 (H) 04/08/2022   CHOL 173 04/08/2022   TRIG 67 04/08/2022   HDL 71 04/08/2022   LDLCALC 89 04/08/2022   ALT 16 04/08/2022   AST 19 04/08/2022   NA 141 04/08/2022   K 5.1 04/08/2022   CL 103 04/08/2022   CREATININE 0.70 04/08/2022   BUN 32 (H) 04/08/2022   CO2 24 04/08/2022   TSH 2.720 11/19/2021   INR 1.7 (H) 04/08/2008   HGBA1C 6.4 (H) 04/08/2022   MICROALBUR 1.0 11/22/2013     Assessment & Plan:   Problem List Items Addressed This Visit       Other   Acute pain of left knee - Primary    Has known underlying osteoarthritis.  Tramadol as directed.  Referring to orthopedics per patient request.      Relevant Orders   Ambulatory referral to Orthopedic Surgery    Meds ordered this encounter  Medications   traMADol (ULTRAM) 50 MG tablet    Sig: Take 1 tablet (50 mg total) by mouth every 12 (twelve) hours as needed for up to 5 days.    Dispense:  10 tablet    Refill:  0   Deborah Wamser DO [facility]

## 2022-12-12 DIAGNOSIS — B351 Tinea unguium: Secondary | ICD-10-CM | POA: Diagnosis not present

## 2022-12-12 DIAGNOSIS — E1142 Type 2 diabetes mellitus with diabetic polyneuropathy: Secondary | ICD-10-CM | POA: Diagnosis not present

## 2022-12-12 DIAGNOSIS — L84 Corns and callosities: Secondary | ICD-10-CM | POA: Diagnosis not present

## 2022-12-12 DIAGNOSIS — M79673 Pain in unspecified foot: Secondary | ICD-10-CM | POA: Diagnosis not present

## 2022-12-15 ENCOUNTER — Other Ambulatory Visit: Payer: Self-pay | Admitting: Internal Medicine

## 2022-12-16 ENCOUNTER — Ambulatory Visit (INDEPENDENT_AMBULATORY_CARE_PROVIDER_SITE_OTHER): Payer: Medicare Other

## 2022-12-16 DIAGNOSIS — N3010 Interstitial cystitis (chronic) without hematuria: Secondary | ICD-10-CM | POA: Diagnosis not present

## 2022-12-16 LAB — MICROSCOPIC EXAMINATION

## 2022-12-16 LAB — URINALYSIS, ROUTINE W REFLEX MICROSCOPIC
Bilirubin, UA: NEGATIVE
Glucose, UA: NEGATIVE
Ketones, UA: NEGATIVE
Nitrite, UA: NEGATIVE
Protein,UA: NEGATIVE
RBC, UA: NEGATIVE
Specific Gravity, UA: 1.015 (ref 1.005–1.030)
Urobilinogen, Ur: 0.2 mg/dL (ref 0.2–1.0)
pH, UA: 6 (ref 5.0–7.5)

## 2022-12-16 MED ORDER — SODIUM BICARBONATE 8.4 % IV SOLN
50.0000 meq | Freq: Once | INTRAVENOUS | Status: AC
Start: 2022-12-16 — End: 2022-12-16
  Administered 2022-12-16: 50 meq

## 2022-12-16 MED ORDER — HYDROCORTISONE SOD SUC (PF) 100 MG IJ SOLR
100.0000 mg | Freq: Once | INTRAMUSCULAR | Status: AC
Start: 2022-12-16 — End: 2022-12-16
  Administered 2022-12-16: 100 mg

## 2022-12-16 MED ORDER — DIMETHYL SULFOXIDE 50 % IS SOLN
50.0000 mL | Freq: Once | INTRAVESICAL | Status: AC
Start: 2022-12-16 — End: 2022-12-16
  Administered 2022-12-16: 50 mL via URETHRAL

## 2022-12-16 MED ORDER — LIDOCAINE HCL 2 % IJ SOLN
10.0000 mL | Freq: Once | INTRAMUSCULAR | Status: AC
Start: 2022-12-16 — End: 2022-12-16
  Administered 2022-12-16: 200 mg

## 2022-12-16 NOTE — Progress Notes (Signed)
Bladder Instillation DMSO  Due to IC patient is present today for a Bladder Instillation of DMSO treatment. Patient was cleaned and prepped in a sterile fashion with betadine.  A 20FR catheter was inserted, urine return was noted , urine was YELLOW in color.  DMSO treatment was instilled into the bladder. The catheter was then removed. Patient tolerated well, no complications were noted pt was instructed to hold the instillation for 20 minutes and then will void it out. Pt voiced understanding.    Performed by: Hasten Sweitzer LPN  Follow up/ Additional notes: keep scheduled NV

## 2022-12-18 DIAGNOSIS — M25562 Pain in left knee: Secondary | ICD-10-CM | POA: Diagnosis not present

## 2022-12-20 ENCOUNTER — Telehealth: Payer: Self-pay | Admitting: Family Medicine

## 2022-12-20 DIAGNOSIS — E785 Hyperlipidemia, unspecified: Secondary | ICD-10-CM

## 2022-12-20 DIAGNOSIS — I10 Essential (primary) hypertension: Secondary | ICD-10-CM

## 2022-12-20 DIAGNOSIS — E1100 Type 2 diabetes mellitus with hyperosmolarity without nonketotic hyperglycemic-hyperosmolar coma (NKHHC): Secondary | ICD-10-CM

## 2022-12-20 NOTE — Telephone Encounter (Signed)
Patient is requesting labs before appointment on the November 18th

## 2022-12-23 ENCOUNTER — Ambulatory Visit (INDEPENDENT_AMBULATORY_CARE_PROVIDER_SITE_OTHER): Payer: Medicare Other | Admitting: Gastroenterology

## 2022-12-23 ENCOUNTER — Encounter: Payer: Self-pay | Admitting: Gastroenterology

## 2022-12-23 VITALS — BP 135/69 | HR 90 | Temp 98.7°F | Ht 63.0 in | Wt 141.2 lb

## 2022-12-23 DIAGNOSIS — K2100 Gastro-esophageal reflux disease with esophagitis, without bleeding: Secondary | ICD-10-CM | POA: Diagnosis not present

## 2022-12-23 DIAGNOSIS — K861 Other chronic pancreatitis: Secondary | ICD-10-CM | POA: Diagnosis not present

## 2022-12-23 DIAGNOSIS — K589 Irritable bowel syndrome without diarrhea: Secondary | ICD-10-CM | POA: Diagnosis not present

## 2022-12-23 MED ORDER — FAMOTIDINE 20 MG PO TABS: 20.0000 mg | ORAL_TABLET | Freq: Every day | ORAL | 1 refills | Status: DC

## 2022-12-23 NOTE — Patient Instructions (Addendum)
Take famotidine 20mg  once daily at supper over the next few weeks to see if this helps your evening reflux symptoms, feeling of food sitting in your chest. If no improvement, then you will need a barium xray of your esophagus. Please let me know.  Our goal would be for you to come off famotidine within the next 3 months.  Continue your other medications and supplements as before.  Return to the office as needed.

## 2022-12-23 NOTE — Progress Notes (Signed)
GI Office Note    Referring Provider: Tommie Sams, DO Primary Care Physician:  Tommie Sams, DO  Primary Gastroenterologist: Roetta Sessions, MD   Chief Complaint   Chief Complaint  Patient presents with   Follow-up    Follow up on fecal smearing. She thinks things are better    History of Present Illness   Deborah Gray is a 89 y.o. female presenting today for follow up. Last seen 10/2022. She has history of GERD, esophageal spasms/nutcracker esophagus, idiopathic chronic pancreatitis with chronic pancreatic exocrine insufficiency, IBS with intermittent diarrhea, hemorrhoids. She tries to avoid PPIs due to concerns for causing memory issues. After last EGD, has been on aciphex 20mg  daily. Issues with abdominal pain and concern for bowel obstruction on CT in 02/2022, advised to hold off Levsin and imodium at that time. Had noted increased use to control bowels around the holidays. F/u CT looked good. Suggested she continue levsin prn but avoid imoidum use. She has had hemorrhoid banding neutrally and left lateral and anterior internal hemorrhoids several months ago.  At last ov she was having issues with fecal smearing/incomplete passage of stool. On DRE, palpable rectal abnormality. She completed flex sig which showed semicircumferential nodularity on palpation. Rectal scar involving approximately a third of the circumferential distal rectum 3cm up from anal verge. Most likely due to prior 3 hemorrhoid bands. Scars a little bit proximal to the hemorrhoids. Noted to have minimal grade 2 hemorrhoids. Rectal scar and mucosa biopsied, benign.  Today: fecal smearing better. Benefiber 1 teaspoon daily. Probiotic daily. Stools more formed. Will have to go 2-3 times before gets empty. But overall better. No melena, brbpr. No abdominal pain. Having a lot of ugi symptoms lately. Feels like food stopped in center of chest worse in the evening. Bending over stuff comes up. Belching up sour  stuff. Feels like she needs something added at night for reflux etc. Complains of early satiety. Biggest meal is lunch, usually not hungry at dinner. Not happy with food offerings at the assisted living facility.    Flex sig 10/2022: -rectal scar s/p bx, benign -minimal grade 2 hemorrhoids -distal sigmoid diverticulosis  EGD 12/2021: -Distal esophageal rings,2 tandem. Overlying distal esophageal erosion consistent with erosive reflux esophagitis. Status post dilation.  -Status post esophageal biopsy as described -Inflamed appearing stomach of uncertain significance diffusely status post biopsy normal duodenal bulb and second portion of the duodenum. -Patient not on any meaningful acid suppression therapy. She takes pancreatic enzymes as well. Concerns about dementia risk with PPIs previously. In this setting, the benefits of acid suppression therapy with a PPI far outweighs any theoretical risks of dementia.   Colonoscopy 03/2016: -diverticulosis   Medications   Current Outpatient Medications  Medication Sig Dispense Refill   acetaminophen (TYLENOL) 500 MG tablet Take 500 mg by mouth at bedtime.     ascorbic acid (VITAMIN C) 500 MG tablet Take 500 mg by mouth daily.     aspirin EC 81 MG tablet Take 81 mg by mouth at bedtime.     Carboxymethylcellulose Sodium (THERATEARS) 0.25 % SOLN Place 1 drop into both eyes 2 (two) times daily.     cholecalciferol (VITAMIN D3) 25 MCG (1000 UNIT) tablet Take 1,000 Units by mouth at bedtime.     clidinium-chlordiazePOXIDE (LIBRAX) 5-2.5 MG capsule Take 1 capsule by mouth 3 (three) times daily as needed (for esophageal symptoms). 90 capsule 1   ezetimibe (ZETIA) 10 MG tablet TAKE 1 TABLET BY MOUTH  EVERY DAY 90 tablet 1   Guaifenesin 1200 MG TB12 Take 1,200 mg by mouth daily with supper.     hyoscyamine (LEVSIN SL) 0.125 MG SL tablet USE 1 TABLET UNDER THE TONGUE BEFORE MEALS AND AT BEDTIME AS NEEDED FOR FLARES IN SYMPTOMS, DIARRHEA/ABDOMINAL CRAMPING  270 tablet 1   ipratropium (ATROVENT) 0.06 % nasal spray SMARTSIG:2 Spray(s) Both Nares Twice Daily PRN     losartan (COZAAR) 50 MG tablet TAKE 1 TABLET BY MOUTH EVERY DAY 90 tablet 3   meclizine (ANTIVERT) 25 MG tablet TAKE 1 TABLET BY MOUTH THREE TIMES A DAY AS NEEDED 30 tablet 1   metFORMIN (GLUCOPHAGE) 500 MG tablet TAKE ONE TABLET BY MOUTH EVERY MORNING ONE AT LUNCH AND 2 AT BEDTIME 360 tablet 1   methenamine (HIPREX) 1 g tablet TAKE 1 TABLET BY MOUTH TWICE A DAY 180 tablet 3   mirabegron ER (MYRBETRIQ) 50 MG TB24 tablet Take 1 tablet (50 mg total) by mouth daily. 90 tablet 3   Multiple Vitamins-Minerals (PRESERVISION AREDS 2) CAPS Take 1 capsule by mouth 2 (two) times daily.      nateglinide (STARLIX) 60 MG tablet TAKE 1 TABLET (60 MG TOTAL) BY MOUTH 3 (THREE) TIMES DAILY WITH MEALS. 270 tablet 1   omega-3 acid ethyl esters (LOVAZA) 1 g capsule Take 2 g by mouth every morning.     Pancrelipase, Lip-Prot-Amyl, (CREON) 24000-76000 units CPEP TAKE 3 CAPSULES THREE TIMES DAILY WITH MEALS AND ONE CAPSULE WITH SNACK TWICE DAILY 1000 capsule 5   RABEprazole (ACIPHEX) 20 MG tablet TAKE 1 TABLET BY MOUTH EVERY DAY 90 tablet 3   alfuzosin (UROXATRAL) 10 MG 24 hr tablet Take 1 tablet (10 mg total) by mouth daily with breakfast. (Patient not taking: Reported on 12/04/2022) 90 tablet 3   baclofen (LIORESAL) 10 MG tablet Take 1 tablet (10 mg total) by mouth at bedtime. (Patient not taking: Reported on 12/04/2022) 30 tablet 5   No current facility-administered medications for this visit.    Allergies   Allergies as of 12/23/2022 - Review Complete 12/23/2022  Allergen Reaction Noted   Adhesive [tape] Other (See Comments) 07/21/2016   Estrogens Other (See Comments) 10/15/2012   Sulfonamide derivatives Rash 03/01/2008       Review of Systems   General: Negative for anorexia, weight loss, fever, chills, fatigue, weakness. ENT: Negative for hoarseness, difficulty swallowing, nasal congestion. CV:  Negative for chest pain, angina, palpitations, dyspnea on exertion, peripheral edema.  Respiratory: Negative for dyspnea at rest, dyspnea on exertion, cough, sputum, wheezing.  GI: See history of present illness. GU:  Negative for dysuria, hematuria, urinary incontinence, urinary frequency, nocturnal urination.  Endo: Negative for unusual weight change.     Physical Exam   BP 135/69   Pulse 90   Temp 98.7 F (37.1 C)   Ht 5\' 3"  (1.6 m)   Wt 141 lb 3.2 oz (64 kg)   BMI 25.01 kg/m    General: Well-nourished, well-developed in no acute distress.  Eyes: No icterus. Mouth: Oropharyngeal mucosa moist and pink   Abdomen: Bowel sounds are normal, nontender, nondistended, no hepatosplenomegaly or masses,  no abdominal bruits or hernia , no rebound or guarding.  Rectal: not performed  Extremities: No lower extremity edema. No clubbing or deformities. Neuro: Alert and oriented x 4   Skin: Warm and dry, no jaundice.   Psych: Alert and cooperative, normal mood and affect.  Labs   Lab Results  Component Value Date   HGBA1C 6.4 (H)  04/08/2022   Lab Results  Component Value Date   NA 141 04/08/2022   CL 103 04/08/2022   K 5.1 04/08/2022   CO2 24 04/08/2022   BUN 32 (H) 04/08/2022   CREATININE 0.70 04/08/2022   EGFR 84 04/08/2022   CALCIUM 10.4 (H) 04/08/2022   PHOS 2.7 03/16/2021   ALBUMIN 4.8 (H) 04/08/2022   GLUCOSE 139 (H) 04/08/2022   Lab Results  Component Value Date   ALT 16 04/08/2022   AST 19 04/08/2022   ALKPHOS 91 04/08/2022   BILITOT 0.4 04/08/2022   Lab Results  Component Value Date   WBC 6.7 04/08/2022   HGB 13.2 04/08/2022   HCT 39.9 04/08/2022   MCV 87 04/08/2022   PLT 249 04/08/2022    Imaging Studies   No results found.  Assessment/Plan:   GERD/indigestion/?dysphagia -feels like food sitting in chest, mostly in evenings, ?delayed gastric emptying, reflux sx, or esophageal dysphagia. EGD on file as outlined -continue rabeprazole 20mg  daily  before breakfast -add famotidine 20mg  in evening for short term, if no improvement in symptoms, then consider barium pill esophagram to make sure no recurrent strictures. Patient will let us know if no improvement  Fecal smearing -improved with benefiber -s/p hemorrhoid banding with minimal persistent hemorrhoids noted on recent flex sig  She will continue her maintenance medications and supplements for other GI issues ie EPI, IBS, esophageal spasms as before.   Return ov as needed.      Leanna Battles. Melvyn Neth, MHS, PA-C [facility]

## 2022-12-23 NOTE — Telephone Encounter (Signed)
Called and spoke to patient that labs have been ordered.

## 2022-12-23 NOTE — Telephone Encounter (Signed)
Cook, Jayce G, DO     CBC, CMP, lipid, A1c, urine microalbumin.

## 2022-12-26 ENCOUNTER — Ambulatory Visit (INDEPENDENT_AMBULATORY_CARE_PROVIDER_SITE_OTHER): Payer: Medicare Other | Admitting: Internal Medicine

## 2022-12-26 ENCOUNTER — Encounter: Payer: Self-pay | Admitting: Internal Medicine

## 2022-12-26 ENCOUNTER — Ambulatory Visit (HOSPITAL_COMMUNITY)
Admission: RE | Admit: 2022-12-26 | Discharge: 2022-12-26 | Disposition: A | Discharge status: Home / Self Care | Payer: Medicare Other | Source: Ambulatory Visit | Attending: Internal Medicine | Admitting: Internal Medicine

## 2022-12-26 VITALS — BP 157/67 | HR 93 | Ht 63.0 in | Wt 141.0 lb

## 2022-12-26 DIAGNOSIS — R918 Other nonspecific abnormal finding of lung field: Secondary | ICD-10-CM | POA: Diagnosis not present

## 2022-12-26 DIAGNOSIS — R058 Other specified cough: Secondary | ICD-10-CM

## 2022-12-26 DIAGNOSIS — R911 Solitary pulmonary nodule: Secondary | ICD-10-CM | POA: Diagnosis not present

## 2022-12-26 DIAGNOSIS — D71 Functional disorders of polymorphonuclear neutrophils: Secondary | ICD-10-CM | POA: Diagnosis not present

## 2022-12-26 DIAGNOSIS — I700 Atherosclerosis of aorta: Secondary | ICD-10-CM | POA: Diagnosis not present

## 2022-12-26 NOTE — Assessment & Plan Note (Addendum)
Referred to [facility] 12/26/2022 >>>   Persistent with globus sensation and has had larynscopy per Suszanne Conners several years ago but he declined to repeat study as doesn't have the equipment so referred to [facility]   Discussed in detail all the  indications, usual  risks and alternatives  relative to the benefits with patient who agrees to proceed with w/u as outlined.

## 2022-12-26 NOTE — Progress Notes (Signed)
Deborah Gray, female    DOB: 08-Jul-1933    MRN: [id number]   Brief patient profile:  89 yowf never smoker  retired Geophysicist/field seismologist referred to pulmonary clinic in [place]  03/21/2022 by Dr Everlene Other  for incidental lung nodules on w/u for hematuria.   Remembers bad bronchitis age 89 had to miss school    History of Present Illness  03/21/2022  Pulmonary/ 1st office eval/ Brieana Shimmin / Sales executive Complaint  Patient presents with   Consult    Ct scan abnormal   Dyspnea:  [facility] [place]/ no cooking / limited by back but still doing [employer] / hc parking depends on whether back bothering her  Cough: none  but always botherd by nasal congestion f/u by [employer]  Sleep: occ night sweats x 2 year / no wt loss or resp cc noct  SABA use: none  02: none  Rec You multiple nodules that the other with largest calcified typical of a benign process  To be sure, we will repeat your CT no contrast  in 3 months and see you back afterwords   - 03/21/2022  ESR 9 - 03/21/2022  Quant TB neg - 03/21/2022 cbc with diff   Eos 0.1  06/25/2022  f/u ov/[place] office/[place]ka re: MPNs  maint on dymista / no other resp meds  Chief Complaint  Patient presents with   Follow-up    Pt f/u states that her breathing is fine  Dyspnea:  about the same / still doing grocery store  Cough: none unless active nasal drainage Sleeping: occ sweats head but not whole bed  SABA use: none 02: none  No longer losing wt  RecPlease schedule a follow up visit in 6 months but call sooner if needed with cxr on return as well     12/26/2022  f/u ov/[place] office/Montasia Chisenhall re: MPNs  maint on max gerd rx    Chief Complaint  Patient presents with   Cough  Dyspnea:  [employer] pushing cart  Cough: sensation of globus daytime better p breakfast / [place] neg eval   Sleeping: elevated mattress on thick quilts s    resp cc  SABA use: none  02: none     No obvious day to day or daytime variability or assoc  excess/ purulent sputum or mucus plugs or hemoptysis or cp or chest tightness, subjective wheeze or overt sinus or hb symptoms.    Also denies any obvious fluctuation of symptoms with weather or environmental changes or other aggravating or alleviating factors except as outlined above   No unusual exposure hx or h/o childhood pna/ asthma or knowledge of premature birth.  Current Allergies, Complete Past Medical History, Past Surgical History, Family History, and Social History were reviewed in [employer] record.  ROS  The following are not active complaints unless bolded Hoarseness, sore throat, dysphagia= globus , dental problems, itching, sneezing,  nasal congestion or discharge of excess mucus or purulent secretions, ear ache,   fever, chills, sweats, unintended wt loss or wt gain, classically pleuritic or exertional cp,  orthopnea pnd or arm/hand swelling  or leg swelling, presyncope, palpitations, abdominal pain, anorexia, nausea, vomiting, diarrhea  or change in bowel habits or change in bladder habits, change in stools or change in urine, dysuria, hematuria,  rash, arthralgias, visual complaints, headache, numbness, weakness or ataxia or problems with walking or coordination,  change in mood or  memory.  Current Meds  Medication Sig   acetaminophen (TYLENOL) 500 MG tablet Take 500 mg by mouth at bedtime.   ascorbic acid (VITAMIN C) 500 MG tablet Take 500 mg by mouth daily.   aspirin EC 81 MG tablet Take 81 mg by mouth at bedtime.   Carboxymethylcellulose Sodium (THERATEARS) 0.25 % SOLN Place 1 drop into both eyes 2 (two) times daily.   cholecalciferol (VITAMIN D3) 25 MCG (1000 UNIT) tablet Take 1,000 Units by mouth at bedtime.   clidinium-chlordiazePOXIDE (LIBRAX) 5-2.5 MG capsule Take 1 capsule by mouth 3 (three) times daily as needed (for esophageal symptoms).   ezetimibe (ZETIA) 10 MG tablet TAKE 1 TABLET BY MOUTH EVERY DAY   famotidine (PEPCID) 20 MG  tablet Take 1 tablet (20 mg total) by mouth daily with supper.   Guaifenesin 1200 MG TB12 Take 1,200 mg by mouth daily with supper.   hyoscyamine (LEVSIN SL) 0.125 MG SL tablet USE 1 TABLET UNDER THE TONGUE BEFORE MEALS AND AT BEDTIME AS NEEDED FOR FLARES IN SYMPTOMS, DIARRHEA/ABDOMINAL CRAMPING   ipratropium (ATROVENT) 0.06 % nasal spray SMARTSIG:2 Spray(s) Both Nares Twice Daily PRN   losartan (COZAAR) 50 MG tablet TAKE 1 TABLET BY MOUTH EVERY DAY   meclizine (ANTIVERT) 25 MG tablet TAKE 1 TABLET BY MOUTH THREE TIMES A DAY AS NEEDED   metFORMIN (GLUCOPHAGE) 500 MG tablet TAKE ONE TABLET BY MOUTH EVERY MORNING ONE AT LUNCH AND 2 AT BEDTIME   methenamine (HIPREX) 1 g tablet TAKE 1 TABLET BY MOUTH TWICE A DAY   mirabegron ER (MYRBETRIQ) 50 MG TB24 tablet Take 1 tablet (50 mg total) by mouth daily.   Multiple Vitamins-Minerals (PRESERVISION AREDS 2) CAPS Take 1 capsule by mouth 2 (two) times daily.    nateglinide (STARLIX) 60 MG tablet TAKE 1 TABLET (60 MG TOTAL) BY MOUTH 3 (THREE) TIMES DAILY WITH MEALS.   omega-3 acid ethyl esters (LOVAZA) 1 g capsule Take 2 g by mouth every morning.   Pancrelipase, Lip-Prot-Amyl, (CREON) 24000-76000 units CPEP TAKE 3 CAPSULES THREE TIMES DAILY WITH MEALS AND ONE CAPSULE WITH SNACK TWICE DAILY   RABEprazole (ACIPHEX) 20 MG tablet TAKE 1 TABLET BY MOUTH EVERY DAY                 Past Medical History:  Diagnosis Date   Arthritis    Benign neoplasm of colon    Constipation    Diaphragmatic hernia without mention of obstruction or gangrene    Diverticulosis of colon (without mention of hemorrhage)    Dysphagia, pharyngoesophageal phase    Esophageal reflux    Essential hypertension    Heart murmur    Hemorrhoids    Hyperlipidemia    Internal hemorrhoids without mention of complication    Left wrist fracture    PONV (postoperative nausea and vomiting)    Stricture and stenosis of esophagus    Type 2 diabetes mellitus (HCC)    Unspecified gastritis  and gastroduodenitis without mention of hemorrhage        Objective:    wts   12/26/2022        141   06/25/22 141 lb 9.6 oz (64.2 kg)  06/04/22 137 lb 12.8 oz (62.5 kg)  05/31/22 139 lb (63 kg)    Vital signs reviewed  12/26/2022  - Note at rest 02 sats  97% on RA   General appearance:    pleasant amb wf freq throat clearing    HEENT : Oropharynx  clear/ no pnds/ mucosa dry  Nasal turbinates nl    NECK :  without  apparent JVD/ palpable Nodes/TM    LUNGS: no acc muscle use, Severely kyphotic contour chest  with minimal insp crackles in bases without cough on insp or exp maneuvers   CV:  RRR  no s3 or murmur or increase in P2, and no edema   ABD:  soft and nontender with nl inspiratory excursion in the supine position. No bruits or organomegaly appreciated   MS:  stooped over gait s assistance, ext warm without deformities Or obvious joint restrictions  calf tenderness, cyanosis or clubbing    SKIN: warm and dry without lesions    NEURO:  alert, approp, nl sensorium with  no motor or cerebellar deficits apparent.     CXR PA and Lateral:   12/26/2022 :    I personally reviewed images and impression is as follows:      No acute cardiopulmonary findings       Assessment

## 2022-12-26 NOTE — Assessment & Plan Note (Addendum)
See incidental CT chest 03/20/22 during w/u for hematuria -  03/21/2022  ESR 9 - 03/21/2022  Quant TB neg - 03/21/2022 cbc with diff   Eos 0.1 -  Chest CT  06/19/22 improved MPN/ wax and wane and assoc bronchiectasis   No radiographic or clinical progression   Continue conservative f/u q 6 m, call sooner prn   Discussed in detail all the  indications, usual  risks and alternatives  relative to the benefits with patient who agrees to proceed with conservative f/u as outlined           Each maintenance medication was reviewed in detail including emphasizing most importantly the difference between maintenance and prns and under what circumstances the prns are to be triggered using an action plan format where appropriate.  Total time for H and P, chart review, counseling,   and generating customized AVS unique to this office visit / same day charting  > 30 min

## 2022-12-26 NOTE — Patient Instructions (Signed)
GERD (REFLUX)  is an extremely common cause of respiratory symptoms just like yours , many times with no obvious heartburn at all.    It can be treated with medication, but also with lifestyle changes including elevation of the head of your bed (ideally with 6 -8inch blocks under the headboard of your bed),  Smoking cessation, avoidance of late meals, excessive alcohol, and avoid fatty foods, chocolate, peppermint, colas, red wine, and acidic juices such as orange juice.  NO MINT OR MENTHOL PRODUCTS SO NO COUGH DROPS  USE SUGARLESS CANDY INSTEAD (Jolley ranchers or Stover's or Life Savers) or even ice chips will also do - the key is to swallow to prevent all throat clearing. NO OIL BASED VITAMINS - use powdered substitutes.  Avoid fish oil when coughing.   My office will be contacting you by phone for referral to [facility]   - if you don't hear back from my office within one week please call us back or notify us thru MyChart and we'll address it right away.   Please remember to go to the  x-ray department  @  [facility] for your tests - we will call you with the results when they are available     Please schedule a follow up visit in 6  months but call sooner if needed

## 2022-12-28 ENCOUNTER — Encounter: Payer: Self-pay | Admitting: Internal Medicine

## 2022-12-30 DIAGNOSIS — E1100 Type 2 diabetes mellitus with hyperosmolarity without nonketotic hyperglycemic-hyperosmolar coma (NKHHC): Secondary | ICD-10-CM | POA: Diagnosis not present

## 2022-12-30 DIAGNOSIS — E785 Hyperlipidemia, unspecified: Secondary | ICD-10-CM | POA: Diagnosis not present

## 2022-12-30 DIAGNOSIS — I10 Essential (primary) hypertension: Secondary | ICD-10-CM | POA: Diagnosis not present

## 2022-12-31 ENCOUNTER — Encounter (INDEPENDENT_AMBULATORY_CARE_PROVIDER_SITE_OTHER): Payer: Self-pay | Admitting: Otolaryngology

## 2022-12-31 LAB — CBC WITH DIFFERENTIAL/PLATELET
Basophils Absolute: 0 10*3/uL (ref 0.0–0.2)
Basos: 0 %
EOS (ABSOLUTE): 0.3 10*3/uL (ref 0.0–0.4)
Eos: 3 %
Hematocrit: 44.5 % (ref 34.0–46.6)
Hemoglobin: 14.5 g/dL (ref 11.1–15.9)
Immature Grans (Abs): 0 10*3/uL (ref 0.0–0.1)
Immature Granulocytes: 1 %
Lymphocytes Absolute: 1.9 10*3/uL (ref 0.7–3.1)
Lymphs: 22 %
MCH: 28.8 pg (ref 26.6–33.0)
MCHC: 32.6 g/dL (ref 31.5–35.7)
MCV: 89 fL (ref 79–97)
Monocytes Absolute: 0.9 10*3/uL (ref 0.1–0.9)
Monocytes: 10 %
Neutrophils Absolute: 5.6 10*3/uL (ref 1.4–7.0)
Neutrophils: 64 %
Platelets: 293 10*3/uL (ref 150–450)
RBC: 5.03 x10E6/uL (ref 3.77–5.28)
RDW: 13.9 % (ref 11.7–15.4)
WBC: 8.8 10*3/uL (ref 3.4–10.8)

## 2022-12-31 LAB — COMPREHENSIVE METABOLIC PANEL
ALT: 13 [IU]/L (ref 0–32)
AST: 15 [IU]/L (ref 0–40)
Albumin: 4.5 g/dL (ref 3.7–4.7)
Alkaline Phosphatase: 109 [IU]/L (ref 44–121)
BUN/Creatinine Ratio: 27 (ref 12–28)
BUN: 24 mg/dL (ref 8–27)
Bilirubin Total: 0.4 mg/dL (ref 0.0–1.2)
CO2: 24 mmol/L (ref 20–29)
Calcium: 10.6 mg/dL — ABNORMAL HIGH (ref 8.7–10.3)
Chloride: 104 mmol/L (ref 96–106)
Creatinine, Ser: 0.89 mg/dL (ref 0.57–1.00)
Globulin, Total: 2.7 g/dL (ref 1.5–4.5)
Glucose: 151 mg/dL — ABNORMAL HIGH (ref 70–99)
Potassium: 5.2 mmol/L (ref 3.5–5.2)
Sodium: 144 mmol/L (ref 134–144)
Total Protein: 7.2 g/dL (ref 6.0–8.5)
eGFR: 63 mL/min/{1.73_m2} (ref >59)

## 2022-12-31 LAB — LIPID PANEL
Chol/HDL Ratio: 2.6 ratio (ref 0.0–4.4)
Cholesterol, Total: 184 mg/dL (ref 100–199)
HDL: 71 mg/dL (ref >39)
LDL Chol Calc (NIH): 98 mg/dL (ref 0–99)
Triglycerides: 85 mg/dL (ref 0–149)
VLDL Cholesterol Cal: 15 mg/dL (ref 5–40)

## 2022-12-31 LAB — MICROALBUMIN / CREATININE URINE RATIO
Creatinine, Urine: 64.7 mg/dL
Microalb/Creat Ratio: 24 mg/g{creat} (ref 0–29)
Microalbumin, Urine: 15.4 ug/mL

## 2022-12-31 LAB — HEMOGLOBIN A1C
Est. average glucose Bld gHb Est-mCnc: 146 mg/dL
Hgb A1c MFr Bld: 6.7 % — ABNORMAL HIGH (ref 4.8–5.6)

## 2023-01-06 ENCOUNTER — Encounter: Payer: Self-pay | Admitting: Family Medicine

## 2023-01-06 ENCOUNTER — Ambulatory Visit (INDEPENDENT_AMBULATORY_CARE_PROVIDER_SITE_OTHER): Payer: Medicare Other | Admitting: Family Medicine

## 2023-01-06 VITALS — BP 119/57 | HR 63 | Temp 97.9°F | Ht 63.0 in | Wt 141.0 lb

## 2023-01-06 DIAGNOSIS — E119 Type 2 diabetes mellitus without complications: Secondary | ICD-10-CM

## 2023-01-06 DIAGNOSIS — Z7984 Long term (current) use of oral hypoglycemic drugs: Secondary | ICD-10-CM

## 2023-01-06 DIAGNOSIS — K580 Irritable bowel syndrome with diarrhea: Secondary | ICD-10-CM

## 2023-01-06 DIAGNOSIS — I10 Essential (primary) hypertension: Secondary | ICD-10-CM

## 2023-01-06 DIAGNOSIS — E1159 Type 2 diabetes mellitus with other circulatory complications: Secondary | ICD-10-CM

## 2023-01-06 MED ORDER — NATEGLINIDE 60 MG PO TABS: 60.0000 mg | ORAL_TABLET | Freq: Three times a day (TID) | ORAL | 3 refills | Status: AC

## 2023-01-06 NOTE — Assessment & Plan Note (Signed)
Stable

## 2023-01-06 NOTE — Progress Notes (Signed)
Subjective:  Patient ID: Deborah Gray, female    DOB: 09-May-1933  Age: 89 y.o. MRN: [id number]  CC:   Chief Complaint  Patient presents with   Hypertension   left knee sharp pains    Tinnutis intremittent    HPI:  89 year old female with the below mentioned medical problems presents for follow up.  Continues to have GI issues but these are chronic and stable.  Follows with GI.  Hypertension well-controlled on losartan.  Patient worried about her A1c.  A1c 6.7.  This is quite good control especially for woman her age.  She would like to continue her Starlix.  Needs refill.  She is also on metformin.  Patient also reports that she is dealing with left knee pain.  Has been seen by orthopedics and has had an injection.  Additionally, patient states that she has been bothered by varicose veins recently.  Patient Active Problem List   Diagnosis Date Noted   Upper airway cough syndrome 12/26/2022   Interstitial cystitis 10/24/2022   History of recurrent UTIs 10/24/2022   Bilateral renal cysts 10/24/2022   Osteoporosis 07/05/2022   Multiple pulmonary nodules determined by computed tomography of lung 03/21/2022   Nephrolithiasis 03/15/2021   Prolapsed internal hemorrhoids, grade 3 01/23/2021   Spinal stenosis of lumbar region 12/02/2017   Osteoarthritis of left knee 12/02/2017   Degeneration of lumbar intervertebral disc 11/18/2017   IBS (irritable bowel syndrome) 01/30/2015   Type 2 diabetes mellitus (HCC) 12/22/2014   Hyperlipidemia, unspecified 12/22/2014   Essential hypertension 11/25/2013   H/O esophageal spasm 01/27/2012   Constipation 01/27/2012   Chronic pancreatitis (HCC) 02/26/2011   Gastroesophageal reflux disease 01/18/2011    Social Hx   Social History   Socioeconomic History   Marital status: Widowed    Spouse name: Not on file   Number of children: 3   Years of education: Not on file   Highest education level: Not on file  Occupational History    Occupation: Retired  Tobacco Use   Smoking status: Never   Smokeless tobacco: Never   Tobacco comments:    Never smoked  Vaping Use   Vaping status: Never Used  Substance and Sexual Activity   Alcohol use: No    Alcohol/week: 0.0 standard drinks of alcohol   Drug use: No   Sexual activity: Not Currently  Other Topics Concern   Not on file  Social History Narrative   Widowed since 07/2018. Married x 61 years.   3 sons. 37, 1963 and 1966.   Social Determinants of Health   Financial Resource Strain: Low Risk  (05/31/2022)   Overall Financial Resource Strain (CARDIA)    Difficulty of Paying Living Expenses: Not hard at all  Food Insecurity: No Food Insecurity (05/31/2022)   Hunger Vital Sign    Worried About Running Out of Food in the Last Year: Never true    Ran Out of Food in the Last Year: Never true  Transportation Needs: No Transportation Needs (05/31/2022)   PRAPARE - Administrator, Civil Service (Medical): No    Lack of Transportation (Non-Medical): No  Physical Activity: Sufficiently Active (05/31/2022)   Exercise Vital Sign    Days of Exercise per Week: 5 days    Minutes of Exercise per Session: 30 min  Stress: No Stress Concern Present (05/31/2022)   [employer] of Occupational Health - Occupational Stress Questionnaire    Feeling of Stress : Not at all  Social  Connections: Moderately Integrated (05/31/2022)   Social Connection and Isolation Panel [NHANES]    Frequency of Communication with Friends and Family: More than three times a week    Frequency of Social Gatherings with Friends and Family: More than three times a week    Attends Religious Services: More than 4 times per year    Active Member of [employer] or Organizations: Yes    Attends Banker Meetings: More than 4 times per year    Marital Status: Widowed    Review of Systems  Constitutional: Negative.    Per HPI  Objective:  BP (!) 119/57   Pulse 63   Temp 97.9 F (36.6  C)   Ht 5\' 3"  (1.6 m)   Wt 141 lb (64 kg)   SpO2 97%   BMI 24.98 kg/m      01/06/2023    2:17 PM 12/26/2022    2:31 PM 12/23/2022    2:47 PM  BP/Weight  Systolic BP 119 157 135  Diastolic BP 57 67 69  Wt. (Lbs) 141 141 141.2  BMI 24.98 kg/m2 24.98 kg/m2 25.01 kg/m2    Physical Exam Vitals and nursing note reviewed.  Constitutional:      General: She is not in acute distress.    Appearance: Normal appearance.  HENT:     Head: Normocephalic and atraumatic.  Cardiovascular:     Rate and Rhythm: Normal rate and regular rhythm.  Pulmonary:     Effort: Pulmonary effort is normal.     Breath sounds: Normal breath sounds.  Skin:    Comments: Varicose veins noted on the lower extremities.  Neurological:     Mental Status: She is alert.  Psychiatric:        Mood and Affect: Mood normal.        Behavior: Behavior normal.     Lab Results  Component Value Date   WBC 8.8 12/30/2022   HGB 14.5 12/30/2022   HCT 44.5 12/30/2022   PLT 293 12/30/2022   GLUCOSE 151 (H) 12/30/2022   CHOL 184 12/30/2022   TRIG 85 12/30/2022   HDL 71 12/30/2022   LDLCALC 98 12/30/2022   ALT 13 12/30/2022   AST 15 12/30/2022   NA 144 12/30/2022   K 5.2 12/30/2022   CL 104 12/30/2022   CREATININE 0.89 12/30/2022   BUN 24 12/30/2022   CO2 24 12/30/2022   TSH 2.720 11/19/2021   INR 1.7 (H) 04/08/2008   HGBA1C 6.7 (H) 12/30/2022   MICROALBUR 1.0 11/22/2013     Assessment & Plan:   Problem List Items Addressed This Visit       Cardiovascular and Mediastinum   Essential hypertension    Stable.  Continue losartan.        Digestive   IBS (irritable bowel syndrome)    Stable.        Endocrine   Type 2 diabetes mellitus (HCC) - Primary    At goal.  Starlix refilled.      Relevant Medications   nateglinide (STARLIX) 60 MG tablet    Meds ordered this encounter  Medications   nateglinide (STARLIX) 60 MG tablet    Sig: Take 1 tablet (60 mg total) by mouth 3 (three) times daily  with meals.    Dispense:  270 tablet    Refill:  3    Follow-up:  6 months  Emira Eubanks Adriana Simas DO [facility]

## 2023-01-06 NOTE — Assessment & Plan Note (Signed)
At goal.  Starlix refilled.

## 2023-01-06 NOTE — Assessment & Plan Note (Signed)
Stable.  Continue losartan. 

## 2023-01-06 NOTE — Patient Instructions (Signed)
Follow up in 6 months.  Take care  Dr. Wren Gallaga  

## 2023-01-13 ENCOUNTER — Ambulatory Visit (INDEPENDENT_AMBULATORY_CARE_PROVIDER_SITE_OTHER): Payer: Medicare Other

## 2023-01-13 DIAGNOSIS — N3010 Interstitial cystitis (chronic) without hematuria: Secondary | ICD-10-CM | POA: Diagnosis not present

## 2023-01-13 MED ORDER — HYDROCORTISONE SOD SUC (PF) 100 MG IJ SOLR
125.0000 mg | Freq: Once | INTRAMUSCULAR | Status: DC
Start: 2023-01-13 — End: 2023-01-13

## 2023-01-13 MED ORDER — METHYLPREDNISOLONE SODIUM SUCC 125 MG IJ SOLR
125.0000 mg | Freq: Once | INTRAMUSCULAR | Status: AC
Start: 2023-01-13 — End: 2023-01-13
  Administered 2023-01-13: 125 mg via INTRAMUSCULAR

## 2023-01-13 MED ORDER — LIDOCAINE HCL (PF) 2 % IJ SOLN
10.0000 mL | Freq: Once | INTRAMUSCULAR | Status: AC
Start: 2023-01-13 — End: ?

## 2023-01-13 MED ORDER — SODIUM BICARBONATE 8.4 % IV SOLN
50.0000 meq | Freq: Once | INTRAVENOUS | Status: AC
Start: 2023-01-13 — End: 2023-01-13
  Administered 2023-01-13: 50 meq

## 2023-01-13 MED ORDER — DIMETHYL SULFOXIDE 50 % IS SOLN
50.0000 mL | Freq: Once | INTRAVESICAL | Status: AC
Start: 2023-01-13 — End: 2023-01-13
  Administered 2023-01-13: 50 mL via URETHRAL

## 2023-01-13 MED ORDER — SODIUM BICARBONATE 8.4 % IV SOLN
50.0000 meq | Freq: Once | INTRAVENOUS | Status: DC
Start: 2023-01-13 — End: 2023-01-13

## 2023-01-13 MED ORDER — LIDOCAINE HCL (PF) 2 % IJ SOLN
50.0000 mg | Freq: Once | INTRAMUSCULAR | Status: DC
Start: 2023-01-13 — End: 2023-01-13

## 2023-01-13 MED ORDER — LIDOCAINE HCL 2 % IJ SOLN
10.0000 mL | Freq: Once | INTRAMUSCULAR | Status: AC
Start: 2023-01-13 — End: 2023-01-13
  Administered 2023-01-13: 200 mg

## 2023-01-14 LAB — URINALYSIS, ROUTINE W REFLEX MICROSCOPIC
Bilirubin, UA: NEGATIVE
Glucose, UA: NEGATIVE
Ketones, UA: NEGATIVE
Nitrite, UA: NEGATIVE
Protein,UA: NEGATIVE
RBC, UA: NEGATIVE
Specific Gravity, UA: 1.015 (ref 1.005–1.030)
Urobilinogen, Ur: 0.2 mg/dL (ref 0.2–1.0)
pH, UA: 6 (ref 5.0–7.5)

## 2023-01-14 LAB — MICROSCOPIC EXAMINATION: Bacteria, UA: NONE SEEN

## 2023-01-15 NOTE — Progress Notes (Signed)
Bladder Instillation DMSO  Due to interstitial cystitis patient is present today for a Bladder Instillation of DMSO treatment. Patient was cleaned and prepped in a sterile fashion with betadine.  A 18FR catheter was inserted, urine return was noted , urine was yellow in color.  DMSO treatment was instilled into the bladder. The catheter was then removed. Patient tolerated well, no complications were noted pt was instructed to hold the instillation for 20 minutes and then will void it out. Pt voiced understanding.    Preformed by: Guss Bunde, CMA  Follow up/ Additional notes: as scheduled.

## 2023-01-21 ENCOUNTER — Other Ambulatory Visit: Payer: Self-pay | Admitting: Cardiology

## 2023-01-24 ENCOUNTER — Ambulatory Visit: Payer: Self-pay | Admitting: Family Medicine

## 2023-01-24 ENCOUNTER — Ambulatory Visit: Payer: Self-pay

## 2023-01-24 NOTE — Telephone Encounter (Signed)
  Chief Complaint: vertigo Symptoms: feels like room is whirling, difficulty changing positions. Frequency: yesterday evening, resolved at this tim Pertinent Negatives: Patient denies weakness on one side, headache, changes in speech or vision Disposition: [] ED /[x] Urgent Care (no appt availability in office) / [] Appointment(In office/virtual)/ []  [facility]/ [] Home Care/ [] Refused Recommended Disposition /[] [facility]/ []  Follow-up with PCP Additional Notes: Pt called stating she had an extreme attack of vertigo last night around 2100. States she stood up and dropped her purse and when she went to bend over to pick it up she lost balance due to "whirling" feeling. States people were present and caught her, preventing a fall to ground, and helped her sit on rollator seat. States she took two pills of her vertigo medication at that time. States vertigo was then resolved. States this morning at 0900 she took 1 pill. Per protocol, pt to be evaluated within 24 hours- next available appt with any provider 01/27/23 @ 1040. Pt scheduled with Urgent Care for today at 1845. This RN encouraged pt to have some drive her to the appt, pt verbalized understanding of plan and care advice. Alerting PCP for review.   Copied from CRM [id number]. Topic: Clinical - Medication Question >> Jan 24, 2023 10:17 AM Louie Casa B wrote: Reason for CRM: pt has vertigo and she wants to know if there is a medication that she should be taking instead of what she has been prescribed she fell yesterday and been having buzzing in her ear for a week Reason for Disposition  [1] NO dizziness now AND [2] one or more stroke risk factors (i.e., hypertension, diabetes, prior stroke/TIA/heart attack)  Answer Assessment - Initial Assessment Questions 1. DESCRIPTION: "Describe your dizziness."     Feel dizzy, "extreme attack", ears cold, room was whirling 2. VERTIGO: "Do you feel like either you or the room is spinning or  tilting?"      Feels like room is whirling 3. LIGHTHEADED: "Do you feel lightheaded?" (e.g., somewhat faint, woozy, weak upon standing)     Denies, says it is relieved after taking medication 4. SEVERITY: "How bad is it?"  "Can you walk?"   - MILD: Feels slightly dizzy and unsteady, but is walking normally.   - MODERATE: Feels unsteady when walking, but not falling; interferes with normal activities (e.g., school, work).   - SEVERE: Unable to walk without falling, or requires assistance to walk without falling.     Able to walk with slow movement, as long as she is not bending over she feels fine 5. ONSET:  "When did the dizziness begin?"     2100 last night 6. AGGRAVATING FACTORS: "Does anything make it worse?" (e.g., standing, change in head position)     Bending over, walking fast 7. CAUSE: "What do you think is causing the dizziness?"     Extreme vertigo 8. RECURRENT SYMPTOM: "Have you had dizziness before?" If Yes, ask: "When was the last time?" "What happened that time?"     Yes, 5-6 years ago 9. OTHER SYMPTOMS: "Do you have any other symptoms?" (e.g., headache, weakness, numbness, vomiting, earache)     Has buzzing in L ear, states this is chronic.  Protocols used: Dizziness - Vertigo-A-AH

## 2023-01-27 ENCOUNTER — Telehealth: Payer: Self-pay | Admitting: *Deleted

## 2023-01-27 ENCOUNTER — Other Ambulatory Visit: Payer: Self-pay | Admitting: Family Medicine

## 2023-01-27 ENCOUNTER — Encounter: Payer: Self-pay | Admitting: *Deleted

## 2023-01-27 MED ORDER — MECLIZINE HCL 25 MG PO TABS: 25.0000 mg | ORAL_TABLET | Freq: Three times a day (TID) | ORAL | 1 refills | Status: DC | PRN

## 2023-01-27 NOTE — Telephone Encounter (Signed)
Patient notified via mychart

## 2023-01-27 NOTE — Telephone Encounter (Signed)
Deborah Sams, DO     Meclizine sent.

## 2023-01-27 NOTE — Telephone Encounter (Signed)
Copied from CRM [id number]. Topic: Clinical - Medication Question >> Jan 27, 2023  3:11 PM Lovey Newcomer R wrote: Reason for CRM: Pt has a bad episode of vertigo and almost fell. Asking the doctor if she can get a fill of the meclizine (ANTIVERT) 25 MG tablet just in case she has another episode. Also wanting medical advise as to what to do. Please f/u @ [phone]

## 2023-01-28 DIAGNOSIS — H34832 Tributary (branch) retinal vein occlusion, left eye, with macular edema: Secondary | ICD-10-CM | POA: Diagnosis not present

## 2023-01-30 ENCOUNTER — Institutional Professional Consult (permissible substitution) (INDEPENDENT_AMBULATORY_CARE_PROVIDER_SITE_OTHER): Payer: Medicare Other | Admitting: Otolaryngology

## 2023-02-03 ENCOUNTER — Telehealth: Payer: Self-pay

## 2023-02-03 NOTE — Telephone Encounter (Signed)
Pt called stating that she had an episode of diarrhea and bloating this weekend. Pt states that the pill she takes under her tongue did not help. Pt is wanting to see about imaging being done after the holidays that you suggested could be done.

## 2023-02-04 NOTE — Telephone Encounter (Signed)
Deborah Gray, the only thing I documented in my last ov note was considering a barium pill esophagram if ongoing concern for dysphagia symptoms after adding famotidine just to make sure no recurrent strictures.   There could have been something else we discussed and was not documented I don't know.   She had a CT scan one year ago and had ileus issues. Is she thinking about a repeat CT? May be best to have a return ov or virtual visit to make sure we do appropriate testing.

## 2023-02-05 NOTE — Telephone Encounter (Signed)
 Pt was made aware and an appt was made.

## 2023-02-06 ENCOUNTER — Encounter (INDEPENDENT_AMBULATORY_CARE_PROVIDER_SITE_OTHER): Payer: Self-pay | Admitting: Otolaryngology

## 2023-02-06 ENCOUNTER — Institutional Professional Consult (permissible substitution) (INDEPENDENT_AMBULATORY_CARE_PROVIDER_SITE_OTHER): Payer: Medicare Other | Admitting: Otolaryngology

## 2023-02-06 ENCOUNTER — Ambulatory Visit (INDEPENDENT_AMBULATORY_CARE_PROVIDER_SITE_OTHER): Payer: Medicare Other | Admitting: Otolaryngology

## 2023-02-06 VITALS — BP 168/79 | HR 96 | Ht 64.0 in | Wt 141.0 lb

## 2023-02-06 DIAGNOSIS — R0982 Postnasal drip: Secondary | ICD-10-CM

## 2023-02-06 DIAGNOSIS — R490 Dysphonia: Secondary | ICD-10-CM

## 2023-02-06 DIAGNOSIS — J383 Other diseases of vocal cords: Secondary | ICD-10-CM

## 2023-02-06 DIAGNOSIS — J3089 Other allergic rhinitis: Secondary | ICD-10-CM

## 2023-02-06 DIAGNOSIS — R053 Chronic cough: Secondary | ICD-10-CM

## 2023-02-06 DIAGNOSIS — R1310 Dysphagia, unspecified: Secondary | ICD-10-CM | POA: Diagnosis not present

## 2023-02-06 DIAGNOSIS — R0981 Nasal congestion: Secondary | ICD-10-CM | POA: Diagnosis not present

## 2023-02-06 DIAGNOSIS — K219 Gastro-esophageal reflux disease without esophagitis: Secondary | ICD-10-CM | POA: Diagnosis not present

## 2023-02-06 MED ORDER — MOMETASONE FUROATE 50 MCG/ACT NA SUSP: 2.0000 | Freq: Every day | NASAL | 12 refills | Status: DC

## 2023-02-06 MED ORDER — IPRATROPIUM BROMIDE 0.03 % NA SOLN: 2.0000 | Freq: Two times a day (BID) | NASAL | 12 refills | Status: DC

## 2023-02-06 NOTE — Patient Instructions (Addendum)
-   start Mometasone in am and Ipratropium Bromide at night  - schedule swallow study   Lloyd Huger Med Nasal Saline Rinse   - start nasal saline rinses with [employer]le available over the counter or online to help with nasal congestion

## 2023-02-06 NOTE — Progress Notes (Signed)
ENT CONSULT:  Reason for Consult: hoarseness and post-nasal drainage/mucus in the throat   HPI: Discussed the use of AI scribe software for clinical note transcription with the patient, who gave verbal consent to proceed.  History of Present Illness   The patient is an 82 yoF, with a history of chronic nasal congestion, post-nasal drainage, presents with complaints of persistent nasal congestion and hoarseness with a sensation of mucus in he throat. They describe the nasal discharge as thick and glue-like, requiring significant effort to get it out and Mucinex to alleviate. The patient has been taking 1200mg  of Mucinex at night and 600mg  in the morning to manage the symptoms, but expresses dissatisfaction with the amount of medication required and sx improvement.   The patient also reports intermittent hoarseness, which seems to resolve spontaneously after a few hours. They have been using a steroid nasal spray, mometasone, to manage these symptoms.  In addition to the nasal symptoms, the patient has a history of a hiatal hernia and has experienced episodes of choking while eating. They have been prescribed Levsin by GI to manage these episodes. The patient also mentions a benign head tremor, which they have been managing without medication.  The patient has had previous imaging of the sinuses, but it has been several years since the last scan. They have also had a chest x-ray recently, which revealed a lung nodule/stable in size. The patient denies any cough unless there is significant nasal drainage.  The patient's symptoms have been ongoing for several years, and they express frustration with the persistent nature of their condition. They have been managing their symptoms with over-the-counter and prescription medications, but are seeking further evaluation and treatment options.   She has hx of esophageal spasm, IBS, esophageal rings, esophagitis on EGD 12/2021. Per GI office notes she also has  chronic pancreatitis and pancreatic insufficiency. On Aciphex currently (Rabeprazole)    Records Reviewed:  Office note by Dr Sherene Sires 12/26/22 Brief patient profile:  27 yowf never smoker  retired Geophysicist/field seismologist referred to pulmonary clinic in [place]  03/21/2022 by Dr Everlene Other  for incidental lung nodules on w/u for hematuria.    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  Patient presents with   Consult      Ct scan abnormal   Dyspnea:  [facility]/ no cooking / limited by back but still doing [employer] / h[employer] parking depends on whether back bothering her  Cough: none  but always botherd by nasal congestion f/u by Suszanne Conners  Sleep: occ night sweats x 2 year / no wt loss or resp cc noct  SABA use: none  02: none  Rec You multiple nodules that the other with largest calcified typical of a benign process  To be sure, we will repeat your CT no contrast  in 3 months and see you back afterwords   - 03/21/2022  ESR 9 - 03/21/2022  Quant TB neg - 03/21/2022 cbc with diff   Eos 0.1   06/25/2022  f/u ov/[place] office/Wert re: [facility]  maint on dymista / no other resp meds      Chief Complaint  Patient presents with   Follow-up      Pt f/u states that her breathing is fine  Dyspnea:  about the same / still doing  grocery store  Cough: none unless active nasal drainage Sleeping: occ sweats head but not whole bed  SABA use: none 02: none  No longer losing wt  RecPlease schedule a follow up visit in 6 months but call sooner if needed with cxr on return as well    Office visit by GI PA-C Tana Coast 12/23/22 Desma Paganini Jurado is a 89 y.o. female presenting today for follow up. Last seen 10/2022. She has history of GERD, esophageal spasms/nutcracker esophagus, idiopathic chronic pancreatitis with chronic pancreatic exocrine insufficiency, IBS with  intermittent diarrhea, hemorrhoids. She tries to avoid PPIs due to concerns for causing memory issues. After last EGD, has been on aciphex 20mg  daily. Issues with abdominal pain and concern for bowel obstruction on CT in 02/2022, advised to hold off Levsin and imodium at that time. Had noted increased use to control bowels around the holidays. F/u CT looked good. Suggested she continue levsin prn but avoid imoidum use. She has had hemorrhoid banding neutrally and left lateral and anterior internal hemorrhoids several months ago.   At last ov she was having issues with fecal smearing/incomplete passage of stool. On DRE, palpable rectal abnormality. She completed flex sig which showed semicircumferential nodularity on palpation. Rectal scar involving approximately a third of the circumferential distal rectum 3cm up from anal verge. Most likely due to prior 3 hemorrhoid bands. Scars a little bit proximal to the hemorrhoids. Noted to have minimal grade 2 hemorrhoids. Rectal scar and mucosa biopsied, benign.   Today: fecal smearing better. Benefiber 1 teaspoon daily. Probiotic daily. Stools more formed. Will have to go 2-3 times before gets empty. But overall better. No melena, brbpr. No abdominal pain. Having a lot of ugi symptoms lately. Feels like food stopped in center of chest worse in the evening. Bending over stuff comes up. Belching up sour stuff. Feels like she needs something added at night for reflux etc. Complains of early satiety. Biggest meal is lunch, usually not hungry at dinner. Not happy with food offerings at the assisted living facility.    Flex sig 10/2022: -rectal scar s/p bx, benign -minimal grade 2 hemorrhoids -distal sigmoid diverticulosis  EGD 12/2021: -Distal esophageal rings,2 tandem. Overlying distal esophageal erosion consistent with erosive reflux esophagitis. Status post dilation.  -Status post esophageal biopsy as described -Inflamed appearing stomach of uncertain significance  diffusely status post biopsy normal duodenal bulb and second portion of the duodenum. -Patient not on any meaningful acid suppression therapy. She takes pancreatic enzymes as well. Concerns about dementia risk with PPIs previously. In this setting, the benefits of acid suppression therapy with a PPI far outweighs any theoretical risks of dementia.   Colonoscopy 03/2016: -diverticulosis   Past Medical History:  Diagnosis Date   Arthritis    Benign neoplasm of colon    Constipation    Diaphragmatic hernia without mention of obstruction or gangrene    Diverticulosis of colon (without mention of hemorrhage)    Dysphagia, pharyngoesophageal phase    Esophageal reflux    Essential hypertension    Gastritis    Heart murmur    Hemorrhoids    Hyperlipidemia    Internal hemorrhoids without mention of complication    Interstitial cystitis    Left wrist fracture    PONV (postoperative nausea and vomiting)    PSVT (paroxysmal supraventricular tachycardia) (HCC)    Stricture and stenosis of esophagus    Type 2 diabetes mellitus (HCC)     Past Surgical History:  Procedure Laterality Date  ABDOMINAL HYSTERECTOMY     BACTERIAL OVERGROWTH TEST N/A 09/06/2015   Procedure: BACTERIAL OVERGROWTH TEST;  Surgeon: Corbin Ade, MD;  Location: [facility];  Service: Endoscopy;  Laterality: N/A;  800   BIOPSY  01/02/2022   Procedure: BIOPSY;  Surgeon: Corbin Ade, MD;  [facility]: [facility];  Service: Endoscopy;;   BIOPSY  11/18/2022   Procedure: BIOPSY;  Surgeon: Corbin Ade, MD;  Location: [facility];  Service: Endoscopy;;   BREAST LUMPECTOMY Right    benign   CATARACT EXTRACTION Bilateral 2006   Implants in both, surgeries done 6 weeks apart   CHOLECYSTECTOMY     COLONOSCOPY  03/08/02   Jarold Motto: diverticulosis, internal hemorrhoids, adenomatous colon polyp   COLONOSCOPY  01/23/04   Jarold Motto: diverticulosis, internal hemorrhoids   COLONOSCOPY  09/25/05   Jarold Motto:  diverticulosis   COLONOSCOPY  07/05/09   Jarold Motto: severe diverticulosis   COLONOSCOPY  01/21/11   Jarold Motto: severe diverticulosis in sigmoid to desc colon, int hemorrhoids, follow up TCS in 5 years   COLONOSCOPY N/A 04/10/2016   [name]: diverticulosis   CYSTOSCOPY W/ URETERAL STENT PLACEMENT Right 03/15/2021   Procedure: CYSTOSCOPY WITH RETROGRADE PYELOGRAM/URETERAL STENT PLACEMENT;  Surgeon: Jannifer Hick, MD;  Location: [facility];  Service: Urology;  Laterality: Right;   CYSTOSCOPY WITH RETROGRADE PYELOGRAM, URETEROSCOPY AND STENT PLACEMENT Right 03/29/2021   Procedure: CYSTOSCOPY WITH RETROGRADE PYELOGRAM, URETEROSCOPY AND STENT EXCHANGE;  Surgeon: Malen Gauze, MD;  Location: [facility];  Service: Urology;  Laterality: Right;   DILATION AND CURETTAGE OF UTERUS     ESOPHAGEAL MANOMETRY  03/21/08   Patterson: findings c/w Nutcracker Esophagus   ESOPHAGOGASTRODUODENOSCOPY  03/08/02   Jarold Motto: esophageal stricture, chronic gerd, s/p dilation.    ESOPHAGOGASTRODUODENOSCOPY  01/23/04   Jarold Motto: esophageal stricture, gastritis, hiatal hernia   ESOPHAGOGASTRODUODENOSCOPY  09/25/05   Jarold Motto: gastritis, benign bx, no H.pylori   ESOPHAGOGASTRODUODENOSCOPY  07/05/09   Jarold Motto: gastropathy, benign small bowel bx and gastric bx   ESOPHAGOGASTRODUODENOSCOPY N/A 05/15/2015   Dr. Jena Gauss- diffuse moderate inflammation characterized by congestion (edema), erythema, and linear erosions was found in the entire examined stomach. bx= reactive gastropathy   ESOPHAGOGASTRODUODENOSCOPY (EGD) WITH PROPOFOL N/A 01/02/2022   Procedure: ESOPHAGOGASTRODUODENOSCOPY (EGD) WITH PROPOFOL;  Surgeon: Corbin Ade, MD;  [facility];  Service: Endoscopy;  Laterality: N/A;  10:30am, asa 2   FLEXIBLE SIGMOIDOSCOPY N/A 11/18/2022   Procedure: FLEXIBLE SIGMOIDOSCOPY;  Surgeon: Corbin Ade, MD;  [facility]: [facility];  Service: Endoscopy;  Laterality: N/A;  10:00 am, asa 2   FOOT SURGERY Left    HEMORRHOID  BANDING  2017   Dr.[name]   HOLMIUM LASER APPLICATION Right 03/29/2021   Procedure: HOLMIUM LASER APPLICATION;  Surgeon: Malen Gauze, MD;  Location: [facility];  Service: Urology;  Laterality: Right;   LAPAROSCOPIC VAGINAL HYSTERECTOMY     MALONEY DILATION N/A 01/02/2022   Procedure: Elease Hashimoto DILATION;  Surgeon: Corbin Ade, MD;  [facility];  Service: Endoscopy;  Laterality: N/A;   ORIF WRIST FRACTURE Left 06/23/2018   Procedure: OPEN REDUCTION INTERNAL FIXATION (ORIF) LEFT WRIST FRACTURE;  Surgeon: Sheral Apley, MD;  Location: [facility];  Service: Orthopedics;  Laterality: Left;  regional arm block   RECTOCELE REPAIR     TOTAL KNEE ARTHROPLASTY Right     Family History  Problem Relation Age of Onset   Ovarian cancer Mother    Diabetes Father    Heart disease Father  Breast cancer Sister    Liver cancer Sister    Colon cancer Cousin        Paternal side   Diabetes Maternal Aunt    Liver disease Cousin        Maternal side, never drank    Social History:  reports that she has never smoked. She has never used smokeless tobacco. She reports that she does not drink alcohol and does not use drugs.  Allergies:  Allergies  Allergen Reactions   Adhesive [Tape] Other (See Comments)     Blisters    Estrogens Other (See Comments)    Migraines    Other Other (See Comments)   Sulfonamide Derivatives Rash    Medications: I have reviewed the patient's current medications.  The PMH, PSH, Medications, Allergies, and SH were reviewed and updated.  ROS: Constitutional: Negative for fever, weight loss and weight gain. Cardiovascular: Negative for chest pain and dyspnea on exertion. Respiratory: Is not experiencing shortness of breath at rest. Gastrointestinal: Negative for nausea and vomiting. Neurological: Negative for headaches. Psychiatric: The patient is not nervous/anxious  Blood pressure (!) 168/79, pulse 96, height 5\' 4"  (1.626 m), weight  141 lb (64 kg), SpO2 98%.  PHYSICAL EXAM:  Exam: General: Well-developed, well-nourished Communication and Voice: Clear pitch and clarity Respiratory Respiratory effort: Equal inspiration and expiration without stridor Cardiovascular Peripheral Vascular: Warm extremities with equal color/perfusion Eyes: No nystagmus with equal extraocular motion bilaterally Neuro/Psych/Balance: Patient oriented to person, place, and time; Appropriate mood and affect; Gait is intact with no imbalance; Cranial nerves I-XII are intact Head and Face Inspection: Normocephalic and atraumatic without mass or lesion Palpation: Facial skeleton intact without bony stepoffs Salivary Glands: No mass or tenderness Facial Strength: Facial motility symmetric and full bilaterally ENT Pinna: External ear intact and fully developed External canal: Canal is patent with intact skin Tympanic Membrane: Clear and mobile External Nose: No scar or anatomic deformity Internal Nose: Septum is deviated to the left. No polyp, or purulence. Mucosal edema and erythema present.  Bilateral inferior turbinate hypertrophy.  Lips, Teeth, and gums: Mucosa and teeth intact and viable TMJ: No pain to palpation with full mobility Oral cavity/oropharynx: No erythema or exudate, no lesions present Nasopharynx: No mass or lesion with intact mucosa scant purulent crusting  Hypopharynx: Intact mucosa with mild pooling of secretions Larynx Glottic: Full true vocal cord mobility without lesion or mass, VF atrophy glottic gap and laryngeal tremor Supraglottic: Normal appearing epiglottis and AE folds Interarytenoid Space: Moderate pachydermia&edema Subglottic Space: Patent without lesion or edema Neck Neck and Trachea: Midline trachea without mass or lesion Thyroid: No mass or nodularity Lymphatics: No lymphadenopathy  Procedure: Preoperative diagnosis: dysphonia and throat mucus  Postoperative diagnosis:   Same + VF atrophy glottic  insufficiency/laryngeal tremor + GERD LPR  Procedure: Flexible fiberoptic laryngoscopy  Surgeon: Ashok Croon, MD  Anesthesia: Topical lidocaine and Afrin Complications: None Condition is stable throughout exam  Indications and consent:  The patient presents to the clinic with dysphonia.  Indirect laryngoscopy view was incomplete. Thus it was recommended that they undergo a flexible fiberoptic laryngoscopy. All of the risks, benefits, and potential complications were reviewed with the patient preoperatively and verbal informed consent was obtained.  Procedure: The patient was seated upright in the clinic. Topical lidocaine and Afrin were applied to the nasal cavity. After adequate anesthesia had occurred, I then proceeded to pass the flexible telescope into the nasal cavity. The nasal cavity was patent without rhinorrhea or polyp. The nasopharynx was  also patent without mass or lesion. The base of tongue was visualized and was normal. There were no signs of pooling of secretions in the piriform sinuses. The true vocal folds were mobile bilaterally. There were no signs of glottic or supraglottic mucosal lesion or mass. There was moderate interarytenoid pachydermia and post cricoid edema. The telescope was then slowly withdrawn and the patient tolerated the procedure throughout.  PROCEDURE NOTE: nasal endoscopy  Preoperative diagnosis: chronic nasal congestion and post-nasal drainage symptoms  Postoperative diagnosis: same  Procedure: Diagnostic nasal endoscopy (16109)  Surgeon: Ashok Croon, M.D.  Anesthesia: Topical lidocaine and Afrin  H&P REVIEW: The patient's history and physical were reviewed today prior to procedure. All medications were reviewed and updated as well. Complications: None Condition is stable throughout exam Indications and consent: The patient presents with symptoms of chronic sinusitis not responding to previous therapies. All the risks, benefits, and  potential complications were reviewed with the patient preoperatively and informed consent was obtained. The time out was completed with confirmation of the correct procedure.   Procedure: The patient was seated upright in the clinic. Topical lidocaine and Afrin were applied to the nasal cavity. After adequate anesthesia had occurred, the rigid nasal endoscope was passed into the nasal cavity. The nasal mucosa, turbinates, septum, and sinus drainage pathways were visualized bilaterally. This revealed scant purulence mostly dry along nasopharynx There were no polyps or sites of significant inflammation. The mucosa was intact and there was no crusting present. The scope was then slowly withdrawn and the patient tolerated the procedure well. There were no complications or blood loss.    Studies Reviewed: 12/26/22 EXAM: CHEST - 2 VIEW   COMPARISON:  06/25/2022.   FINDINGS: Unremarkable cardiac silhouette. Bibasilar opacities stable consistent with scarring or subsegmental atelectasis. Normal pulmonary vasculature. No pneumothorax. Left lower lung nodule could be a calcified granuloma. Additional calcifications overlie the hila. Aorta is calcified. There are thoracic degenerative changes.   IMPRESSION: Bibasilar scarring or subsegmental atelectasis. Unchanged left lower lung nodule and evidence of old granulomatous disease.    Assessment/Plan: Encounter Diagnoses  Name Primary?   Dysphonia Yes   Age-related vocal fold atrophy    Glottic insufficiency    Chronic cough    Chronic GERD    Environmental and seasonal allergies    Post-nasal drip    Dysphagia, unspecified type    Chronic nasal congestion     Assessment and Plan Chronic hoarseness and voice changes (raspy voice) present for a long time several years. Hx of GERD and extensive GI hx summarized below:  She has hx of esophageal spasm, IBS, esophageal rings, esophagitis on EGD 12/2021. Per GI office notes she also has  chronic pancreatitis and pancreatic insufficiency. On Aciphex currently (Rabeprazole)  Also established with Pulm, Dr Sherene Sires who diagnosed her with upper airway cough syndrome. Has pulmonary nodule and granulomatous disease on chest imaging. She has chronic cough. She has post-nasal drainage.   Exam including flexible laryngoscopy with VF atrophy, laryngeal tremor, she has head tremor hx as well. She also had GERD LPR changes on exam. There was mild pooling of secretions noted. She had evidence of scant purulent crusting along posterior nasopharynx but no pus or polyps in nasal passages. She had evidence of post-nasal drainage.     -Consider voice therapy in the future. We discussed it but she would like to hold off at this time - MBS esophagram - medical management of GERD LPR and post-nasal drainage    Chronic Nasal Congestion:  Persistent despite use of saline and Flonase. Noted thick mucus along posterior nasopharyngeal wall -Nasonex 2 puffs b/l nares in am and Ipratropium Bromide 2 puffs b/l nares in pm -Initiate saline nasal rinses once or twice daily. -Consider sinus CT scan in the future to evaluate for chronic sinus disease if sx will not improve - start weaning off Mucinex, we discussed the dose is too high  Chronic dysphagia  History of choking with eating, esophageal spasms, extensive GI hx of distal esophageal rings, esophagitis, on medication for GERD and esophageal spasm. Hx of hiatal hernia -Order swallow study to evaluate for aspiration and other swallowing disorders - MBS esophagram - continue Asiphex and Levsin per GI recommendations  Follow-up after swallow study to discuss results and potential need for voice therapy.        Thank you for allowing me to participate in the care of this patient. Please do not hesitate to contact me with any questions or concerns.   Ashok Croon, MD [facility] Phone: [phone] Fax:  [phone]    02/07/2023, 7:34 AM

## 2023-02-20 DIAGNOSIS — B351 Tinea unguium: Secondary | ICD-10-CM | POA: Diagnosis not present

## 2023-02-20 DIAGNOSIS — M79675 Pain in left toe(s): Secondary | ICD-10-CM | POA: Diagnosis not present

## 2023-02-20 DIAGNOSIS — E1142 Type 2 diabetes mellitus with diabetic polyneuropathy: Secondary | ICD-10-CM | POA: Diagnosis not present

## 2023-02-24 ENCOUNTER — Ambulatory Visit: Payer: Medicare Other

## 2023-02-24 DIAGNOSIS — N3010 Interstitial cystitis (chronic) without hematuria: Secondary | ICD-10-CM | POA: Diagnosis not present

## 2023-02-24 MED ORDER — SODIUM BICARBONATE 8.4 % IV SOLN
50.0000 meq | Freq: Once | INTRAVENOUS | Status: AC
Start: 2023-02-24 — End: 2023-02-24
  Administered 2023-02-24: 50 meq

## 2023-02-24 MED ORDER — HYDROCORTISONE SOD SUC (PF) 100 MG IJ SOLR
100.0000 mg | Freq: Once | INTRAMUSCULAR | Status: DC
Start: 2023-02-24 — End: 2023-02-24

## 2023-02-24 MED ORDER — LIDOCAINE HCL 2 % IJ SOLN
10.0000 mL | Freq: Once | INTRAMUSCULAR | Status: AC
Start: 2023-02-24 — End: 2023-02-24
  Administered 2023-02-24: 200 mg

## 2023-02-24 MED ORDER — DIMETHYL SULFOXIDE 50 % IS SOLN
50.0000 mL | Freq: Once | INTRAVESICAL | Status: AC
Start: 2023-02-24 — End: 2023-02-24
  Administered 2023-02-24: 50 mL via URETHRAL

## 2023-02-24 MED ORDER — METHYLPREDNISOLONE SODIUM SUCC 125 MG IJ SOLR
125.0000 mg | Freq: Once | INTRAMUSCULAR | Status: AC
Start: 2023-02-24 — End: 2023-02-24
  Administered 2023-02-24: 125 mg via INTRAVENOUS

## 2023-02-24 NOTE — Progress Notes (Signed)
 Bladder Instillation DMSO  Due to Interstitial cystitis patient is present today for a Bladder Instillation of DMSO treatment. Patient was cleaned and prepped in a sterile fashion with betadine.  A 20FR catheter was inserted, urine return was noted 200 ml, urine was yellow in color.  DMSO treatment was instilled into the bladder. The catheter was then removed. Patient tolerated well, no complications were noted pt was instructed to hold the instillation for 20 minutes and then will void it out. Pt voiced understanding.    Preformed by: Carlos, CMA  Follow up/ Additional notes: Keep follow up and nurse visited

## 2023-02-25 ENCOUNTER — Ambulatory Visit (INDEPENDENT_AMBULATORY_CARE_PROVIDER_SITE_OTHER): Payer: Medicare Other | Admitting: Gastroenterology

## 2023-02-25 ENCOUNTER — Encounter: Payer: Self-pay | Admitting: Gastroenterology

## 2023-02-25 VITALS — BP 132/78 | HR 90 | Temp 97.6°F | Ht 64.0 in | Wt 144.6 lb

## 2023-02-25 DIAGNOSIS — K219 Gastro-esophageal reflux disease without esophagitis: Secondary | ICD-10-CM | POA: Diagnosis not present

## 2023-02-25 DIAGNOSIS — K589 Irritable bowel syndrome without diarrhea: Secondary | ICD-10-CM

## 2023-02-25 DIAGNOSIS — K861 Other chronic pancreatitis: Secondary | ICD-10-CM

## 2023-02-25 DIAGNOSIS — R151 Fecal smearing: Secondary | ICD-10-CM

## 2023-02-25 DIAGNOSIS — R140 Abdominal distension (gaseous): Secondary | ICD-10-CM

## 2023-02-25 DIAGNOSIS — R159 Full incontinence of feces: Secondary | ICD-10-CM

## 2023-02-25 NOTE — Progress Notes (Signed)
 GI Office Note    Referring Provider: Cook, Jayce G, DO Primary Care Physician:  Cook, Jayce G, DO  Primary Gastroenterologist: Ozell Hollingshead, MD   Chief Complaint   Chief Complaint  Patient presents with   Follow-up    History of Present Illness   Deborah Gray is a 89 y.o. female presenting today  for follow up. She was last seen 12/2022.   She has history of GERD, esophageal spasms/nutcracker esophagus, idiopathic chronic pancreatitis with chronic pancreatic exocrine insufficiency, IBS with intermittent diarrhea, hemorrhoids. She tries to avoid PPIs due to concerns for causing memory issues. After last EGD, has been on aciphex  20mg  daily. Issues with abdominal pain and concern for bowel obstruction on CT in 02/2022, advised to hold off Levsin  and imodium at that time. Had noted increased use to control bowels around the holidays (01/2022). F/u CT looked good. Suggested she continue levsin  prn but avoid imoidum use. She has had hemorrhoid banding neutrally and left lateral and anterior internal hemorrhoids several months ago.   At last ov she was having issues with fecal smearing/incomplete passage of stool. On DRE, palpable rectal abnormality. She completed flex sig which showed semicircumferential nodularity on palpation. Rectal scar involving approximately a third of the circumferential distal rectum 3cm up from anal verge. Most likely due to prior 3 hemorrhoid bands. Scars a little bit proximal to the hemorrhoids. Noted to have minimal grade 2 hemorrhoids. Rectal scar and mucosa biopsied, benign.  At last ov, I started her on famotidine  at supper for evening reflux and sensation of food sitting in her chest. Plans for BPE if no improvement. Since her last ov here, she has been seen by ENT, Dr. Elena Greulich, for hoarseness and postnasal drainage/mucus in the throat.  Flexible fiberoptic laryngoscopy was completed showing vocal fold atrophy, glottic insufficiency/laryngeal  tremor, GERD/LPR, evidence of mild pooling of secretions, evidence of postnasal drainage.  ENT with plans for MBS esophagram.  Plans for swallow study to evaluate for aspiration and other swallowing disorders-MBS esophagram.  Plans for follow-up pending studies.  Patient voices concerns that she has not been scheduled for any test yet.  Today: Concerned about ongoing bowel issues.  Gets concerned about having fecal urgency, loose stools especially when she goes out.  This often prompts her to take the Levsin  prior to going away from her home although she states she does not do this every time but particularly if she feels like her stomach is going to get upset.  She also takes Benefiber 1 teaspoon daily, does not feel that she can go up on the dose that she feels like this will cause more diarrhea.  She takes probiotics daily.  Typically [place] 3 to [place] 4 stools.  Lately she has been having issues where she passes [place] 1 stools when she tries to wiping it off the toilet.  She may have to sit 45 minutes at a time because the [place] 1 stools continue to come.  Will place toilet tissue around the perianal region to try to catch the stools if she has to get off the toilet.  Rarely takes an Imodium.  She notes increased issues especially with prolonged sitting, wonders if her uterine prolapse is creating more issues with defecation.  She finds adding famotidine  did seem to help some of her reflux symptoms around suppertime but she still has a sensation at times as if something is in her chest and is going to come back up.  Stools  are aggravating as it seems to be affecting her quality of life and makes her concerned about leaving her home.     Flex sig 10/2022: -rectal scar s/p bx, benign -minimal grade 2 hemorrhoids -distal sigmoid diverticulosis  EGD 12/2021: -Distal esophageal rings,2 tandem. Overlying distal esophageal erosion consistent with erosive reflux esophagitis. Status post dilation.   -Status post esophageal biopsy as described -Inflamed appearing stomach of uncertain significance diffusely status post biopsy normal duodenal bulb and second portion of the duodenum. -Patient not on any meaningful acid suppression therapy. She takes pancreatic enzymes as well. Concerns about dementia risk with PPIs previously. In this setting, the benefits of acid suppression therapy with a PPI far outweighs any theoretical risks of dementia.   Colonoscopy 03/2016: -diverticulosis     Medications   Current Outpatient Medications  Medication Sig Dispense Refill   acetaminophen  (TYLENOL ) 500 MG tablet Take 500 mg by mouth at bedtime.     ascorbic acid (VITAMIN C) 500 MG tablet Take 500 mg by mouth daily.     aspirin  EC 81 MG tablet Take 81 mg by mouth at bedtime.     Carboxymethylcellulose Sodium (THERATEARS) 0.25 % SOLN Place 1 drop into both eyes 2 (two) times daily.     cholecalciferol (VITAMIN D3) 25 MCG (1000 UNIT) tablet Take 1,000 Units by mouth at bedtime.     clidinium-chlordiazePOXIDE  (LIBRAX) 5-2.5 MG capsule Take 1 capsule by mouth 3 (three) times daily as needed (for esophageal symptoms). 90 capsule 1   ezetimibe  (ZETIA ) 10 MG tablet TAKE 1 TABLET BY MOUTH EVERY DAY 90 tablet 1   famotidine  (PEPCID ) 20 MG tablet Take 1 tablet (20 mg total) by mouth daily with supper. 90 tablet 1   Guaifenesin 1200 MG TB12 Take 1,200 mg by mouth daily with supper.     hyoscyamine  (LEVSIN  SL) 0.125 MG SL tablet USE 1 TABLET UNDER THE TONGUE BEFORE MEALS AND AT BEDTIME AS NEEDED FOR FLARES IN SYMPTOMS, DIARRHEA/ABDOMINAL CRAMPING 270 tablet 1   ipratropium (ATROVENT ) 0.03 % nasal spray Place 2 sprays into both nostrils every 12 (twelve) hours. 30 mL 12   losartan  (COZAAR ) 50 MG tablet TAKE 1 TABLET BY MOUTH EVERY DAY 90 tablet 3   meclizine  (ANTIVERT ) 25 MG tablet Take 1 tablet (25 mg total) by mouth 3 (three) times daily as needed. 30 tablet 1   metFORMIN  (GLUCOPHAGE ) 500 MG tablet TAKE ONE  TABLET BY MOUTH EVERY MORNING ONE AT LUNCH AND 2 AT BEDTIME 360 tablet 1   methenamine  (HIPREX ) 1 g tablet TAKE 1 TABLET BY MOUTH TWICE A DAY 180 tablet 3   mirabegron  ER (MYRBETRIQ ) 50 MG TB24 tablet Take 1 tablet (50 mg total) by mouth daily. 90 tablet 3   mometasone  (NASONEX ) 50 MCG/ACT nasal spray Place 2 sprays into the nose daily. 1 each 12   Multiple Vitamins-Minerals (PRESERVISION AREDS 2) CAPS Take 1 capsule by mouth 2 (two) times daily.      nateglinide  (STARLIX ) 60 MG tablet Take 1 tablet (60 mg total) by mouth 3 (three) times daily with meals. 270 tablet 3   omega-3 acid ethyl esters (LOVAZA) 1 g capsule Take 2 g by mouth every morning.     Pancrelipase , Lip-Prot-Amyl, (CREON ) 24000-76000 units CPEP TAKE 3 CAPSULES THREE TIMES DAILY WITH MEALS AND ONE CAPSULE WITH SNACK TWICE DAILY 1000 capsule 5   RABEprazole  (ACIPHEX ) 20 MG tablet TAKE 1 TABLET BY MOUTH EVERY DAY 90 tablet 3   Current Facility-Administered Medications  Medication Dose  Route Frequency Provider Last Rate Last Admin   lidocaine  HCl (PF) (XYLOCAINE ) 2 % injection 10 mL  10 mL Infiltration Once Sherrilee Belvie CROME, MD        Allergies   Allergies as of 02/25/2023 - Review Complete 02/25/2023  Allergen Reaction Noted   Adhesive [tape] Other (See Comments) 07/21/2016   Estrogens Other (See Comments) 10/15/2012   Other Other (See Comments) 09/01/2013   Sulfonamide derivatives Rash 03/01/2008       Review of Systems   General: Negative for anorexia, weight loss, fever, chills, fatigue, weakness. ENT: Negative for hoarseness, difficulty swallowing , nasal congestion. CV: Negative for chest pain, angina, palpitations, dyspnea on exertion, peripheral edema.  Respiratory: Negative for dyspnea at rest, dyspnea on exertion, cough, sputum, wheezing.  GI: See history of present illness. GU:  Negative for dysuria, hematuria, urinary incontinence, urinary frequency, nocturnal urination.  Endo: Negative for unusual weight  change.     Physical Exam   BP 132/78 (BP Location: Right Arm, Patient Position: Sitting, Cuff Size: Normal)   Pulse 90   Temp 97.6 F (36.4 C) (Oral)   Ht 5' 4 (1.626 m)   Wt 144 lb 9.6 oz (65.6 kg)   SpO2 97%   BMI 24.82 kg/m    General: Well-nourished, well-developed in no acute distress.  Eyes: No icterus. Mouth: Oropharyngeal mucosa moist and pink   Lungs: Clear to auscultation bilaterally.  Heart: Regular rate and rhythm, no murmurs rubs or gallops.  Abdomen: Bowel sounds are normal, nontender, nondistended, no hepatosplenomegaly or masses,  no abdominal bruits or hernia , no rebound or guarding.  Rectal: not performed  Extremities: No lower extremity edema. No clubbing or deformities. Neuro: Alert and oriented x 4   Skin: Warm and dry, no jaundice.   Psych: Alert and cooperative, normal mood and affect.  Labs   Lab Results  Component Value Date   HGBA1C 6.7 (H) 12/30/2022   Lab Results  Component Value Date   NA 144 12/30/2022   CL 104 12/30/2022   K 5.2 12/30/2022   CO2 24 12/30/2022   BUN 24 12/30/2022   CREATININE 0.89 12/30/2022   EGFR 63 12/30/2022   CALCIUM 10.6 (H) 12/30/2022   PHOS 2.7 03/16/2021   ALBUMIN 4.5 12/30/2022   GLUCOSE 151 (H) 12/30/2022   Lab Results  Component Value Date   ALT 13 12/30/2022   AST 15 12/30/2022   ALKPHOS 109 12/30/2022   BILITOT 0.4 12/30/2022   Lab Results  Component Value Date   WBC 8.8 12/30/2022   HGB 14.5 12/30/2022   HCT 44.5 12/30/2022   MCV 89 12/30/2022   PLT 293 12/30/2022    Imaging Studies   No results found.  Assessment/Plan:   GERD/indigestion/?dysphagia: -some improvement with adding famotidine  in evening. Also continues aciphex  daily.  -recent ENT evaluation with concern for LPR as outlined -would benefit from barium pill esophagram to rule out no esophageal strictures. Last EGD 12/2021. She has BPE ordered by ENT but has not been scheduled. It is also not clear if MBSS planned. Will  reach out to ENT.   IBS/fecal smearing/incontinence: -difficult to find perfect bowel regimen to stabilize stools. Goes from urgent/loose to [place] 1 as outlined. She is very sensitive to medications. I do feel that her issues with passing [place] 1 stools and having trouble completing the stool is due to inadequate hydration/constipation. She does not want to increase her benefiber, afraid it will cause diarrhea. Would ask her to limit  levsin  as much as possible. Can continue when needed, but not to take regularly. Increase water  intake.   Abdominal pain/bloating: -exacerbating by food intolerances/high fat foods -increase creon  to four capsules with fattier meals.    Sonny RAMAN. Ezzard, MHS, PA-C [facility]

## 2023-02-25 NOTE — Patient Instructions (Signed)
 I will reach out to your ENT provider regarding pending barium testing.  Try taking an extra Creon  when you eat foods that may have increased fat to see if this helps with the episodes of bloating/abdominal pain.  I would encourage you to drink plenty of fluid to stay hydrated. This may help with your dry stools/hard balls.  Continue to use Levsin  when needed for abdominal cramping and loose stools but try to limit so you don't swing to constipation/

## 2023-02-27 ENCOUNTER — Telehealth (HOSPITAL_COMMUNITY): Payer: Self-pay | Admitting: *Deleted

## 2023-02-27 DIAGNOSIS — K080 Exfoliation of teeth due to systemic causes: Secondary | ICD-10-CM | POA: Diagnosis not present

## 2023-02-27 NOTE — Telephone Encounter (Signed)
 Attempted to contact patient to schedule OP MBS. Left VM. RKEEL

## 2023-03-05 ENCOUNTER — Ambulatory Visit (HOSPITAL_COMMUNITY)
Admission: RE | Admit: 2023-03-05 | Discharge: 2023-03-05 | Disposition: A | Discharge status: Home / Self Care | Payer: Medicare Other | Source: Ambulatory Visit | Attending: Otolaryngology | Admitting: Otolaryngology

## 2023-03-05 DIAGNOSIS — R1310 Dysphagia, unspecified: Secondary | ICD-10-CM | POA: Insufficient documentation

## 2023-03-05 DIAGNOSIS — K219 Gastro-esophageal reflux disease without esophagitis: Secondary | ICD-10-CM | POA: Diagnosis not present

## 2023-03-07 ENCOUNTER — Telehealth (HOSPITAL_COMMUNITY): Payer: Self-pay | Admitting: *Deleted

## 2023-03-07 NOTE — Telephone Encounter (Signed)
Contacted patient to schedule her for OP MBS. Patient believes she had both test performed at [facility]. Patient to contact Dr. Irene Pap to confirm if Deborah Gray still needs the OP MBS to be completed. RKEEL

## 2023-03-11 DIAGNOSIS — H34832 Tributary (branch) retinal vein occlusion, left eye, with macular edema: Secondary | ICD-10-CM | POA: Diagnosis not present

## 2023-03-19 ENCOUNTER — Telehealth: Payer: Self-pay | Admitting: Gastroenterology

## 2023-03-19 ENCOUNTER — Telehealth (INDEPENDENT_AMBULATORY_CARE_PROVIDER_SITE_OTHER): Payer: Self-pay | Admitting: Otolaryngology

## 2023-03-19 NOTE — Telephone Encounter (Signed)
noted

## 2023-03-19 NOTE — Telephone Encounter (Signed)
Pt states that she is going to hold off at this time due to insurance changes but will call us back once it is straightened out.

## 2023-03-19 NOTE — Telephone Encounter (Signed)
Patient called in and stated that she is going to hold off on the second swallowing study due to insurance issues and not wanting any additional radiation at this time.  Will keep the follow-up appointment scheduled for February.

## 2023-03-19 NOTE — Telephone Encounter (Signed)
Lmom for pt to return call.

## 2023-03-19 NOTE — Telephone Encounter (Signed)
Please let pt know that I reviewed your esophagram ordered by ENT. The lower part of her esophageal does not work well, the muscles are not coordinated and this causes the contrast to pool/stick and sometimes move upward. The barium tablet briefly hung in the esophageal at two locations, suggestive of possible esophageal strictures.   We can offer her EGD/ED with Dr. Jena Gauss ASA 3. I know she was having sensation of stuff sitting in her chest. Some of this could be the motility issues. But if she feels it to be associated during meals, could be due to strictures and may benefit from esophageal dilation.    Day before EGD, 1/2 dose starlix at supper

## 2023-03-21 ENCOUNTER — Other Ambulatory Visit: Payer: Self-pay | Admitting: Family Medicine

## 2023-03-21 DIAGNOSIS — E119 Type 2 diabetes mellitus without complications: Secondary | ICD-10-CM

## 2023-03-24 NOTE — Telephone Encounter (Signed)
Pt called and is ready to move forward with scheduling recommended EGD

## 2023-03-24 NOTE — Telephone Encounter (Signed)
Spoke with pt and she has been scheduled for 3/5. Aware will call back with pre-op appt. Instructions sent to patient.

## 2023-03-24 NOTE — Telephone Encounter (Signed)
 noted

## 2023-03-25 NOTE — Telephone Encounter (Signed)
Spoke with pt and she is aware of pre-op appt details

## 2023-03-26 ENCOUNTER — Ambulatory Visit: Payer: Medicare Other

## 2023-03-26 DIAGNOSIS — N200 Calculus of kidney: Secondary | ICD-10-CM

## 2023-03-26 DIAGNOSIS — N3010 Interstitial cystitis (chronic) without hematuria: Secondary | ICD-10-CM

## 2023-03-26 MED ORDER — SODIUM BICARBONATE 8.4 % IV SOLN
50.0000 meq | Freq: Once | INTRAVENOUS | Status: AC
  Administered 2023-03-26: 50 meq

## 2023-03-26 MED ORDER — DIMETHYL SULFOXIDE 50 % IS SOLN
50.0000 mL | Freq: Once | INTRAVESICAL | Status: AC
  Administered 2023-03-26: 50 mL via URETHRAL

## 2023-03-26 MED ORDER — METHYLPREDNISOLONE SODIUM SUCC 125 MG IJ SOLR: 125.0000 mg | Freq: Once | INTRAMUSCULAR | 0 refills | Status: DC

## 2023-03-26 MED ORDER — METHYLPREDNISOLONE SODIUM SUCC 125 MG IJ SOLR
125.0000 mg | Freq: Once | INTRAMUSCULAR | Status: AC
  Administered 2023-03-26: 125 mg via INTRAMUSCULAR

## 2023-03-26 MED ORDER — LIDOCAINE HCL 2 % IJ SOLN
10.0000 mL | Freq: Once | INTRAMUSCULAR | Status: AC
  Administered 2023-03-26: 200 mg

## 2023-03-26 MED ORDER — METHYLPREDNISOLONE SODIUM SUCC 125 MG IJ SOLR: 125.0000 mg | Freq: Once | INTRAMUSCULAR | Status: DC

## 2023-03-26 NOTE — Progress Notes (Addendum)
 Bladder Instillation  Due to Nephrolithiasis patient is present today for a Bladder Instillation of DMSO. Patient was cleaned and prepped in a sterile fashion with betadine and lidocaine  2% jelly was instilled into the urethra.  A 14FR catheter was inserted, urine return was noted , urine was yellow in color.  50 ml was instilled into the bladder. The catheter was then removed. Patient tolerated well, no complications were noted. Patient held in bladder for 30 minutes prior to procedure starting.   Performed by: Exie DASEN. CMA  Follow up/ Additional notes: As scheduled

## 2023-03-27 LAB — URINALYSIS, ROUTINE W REFLEX MICROSCOPIC
Bilirubin, UA: NEGATIVE
Glucose, UA: NEGATIVE
Ketones, UA: NEGATIVE
Nitrite, UA: NEGATIVE
Protein,UA: NEGATIVE
RBC, UA: NEGATIVE
Specific Gravity, UA: 1.02 (ref 1.005–1.030)
Urobilinogen, Ur: 0.2 mg/dL (ref 0.2–1.0)
pH, UA: 6 (ref 5.0–7.5)

## 2023-03-27 LAB — MICROSCOPIC EXAMINATION: Bacteria, UA: NONE SEEN

## 2023-04-02 ENCOUNTER — Telehealth: Payer: Self-pay

## 2023-04-02 NOTE — Telephone Encounter (Signed)
I would recommend she see her PCP for this.

## 2023-04-02 NOTE — Telephone Encounter (Signed)
Pt was made aware and verbalized understanding.

## 2023-04-02 NOTE — Telephone Encounter (Signed)
Pt called stating that she has developed a rash around her naval that also appears to have a boil. Pt states that she is scheduled for an EGD on 3/05 and didn't know if you needed to take a look at it due to her having a hernia near her naval. Please advise.

## 2023-04-04 ENCOUNTER — Encounter: Payer: Self-pay | Admitting: Physician Assistant

## 2023-04-04 ENCOUNTER — Ambulatory Visit: Payer: Medicare Other | Admitting: Physician Assistant

## 2023-04-04 VITALS — BP 146/73 | HR 90 | Temp 97.7°F | Ht 64.0 in | Wt 142.4 lb

## 2023-04-04 DIAGNOSIS — Z711 Person with feared health complaint in whom no diagnosis is made: Secondary | ICD-10-CM | POA: Diagnosis not present

## 2023-04-04 DIAGNOSIS — Z0000 Encounter for general adult medical examination without abnormal findings: Secondary | ICD-10-CM

## 2023-04-04 NOTE — Progress Notes (Signed)
   Acute Office Visit  Subjective:     Patient ID: Deborah Gray, female    DOB: 1933-02-22, 89 y.o.   MRN: [id number]   HPI Patient is in today for concerns of redness around her navel and a "bugle" on the right side of her abdomen. Patient states she first noticed symptoms at the end of December. She relates symptoms improved, but she noticed the redness around her navel again last week. She denies drainage from her navel. She denies abdominal pain, N/V, fevers, or diarrhea/constipation.   Review of Systems  Constitutional:  Negative for fever, malaise/fatigue and weight loss.  Respiratory:  Negative for cough and shortness of breath.   Cardiovascular:  Negative for chest pain and palpitations.  Gastrointestinal:  Negative for abdominal pain, constipation, diarrhea, nausea and vomiting.  Musculoskeletal:  Negative for myalgias.  Skin:  Positive for rash. Negative for itching.        Objective:     BP (!) 146/73   Pulse 90   Temp 97.7 F (36.5 C)   Ht 5\' 4"  (1.626 m)   Wt 142 lb 6.4 oz (64.6 kg)   SpO2 98%   BMI 24.44 kg/m   Physical Exam Constitutional:      Appearance: Normal appearance.  HENT:     Head: Normocephalic.     Mouth/Throat:     Mouth: Mucous membranes are moist.     Pharynx: Oropharynx is clear.  Eyes:     Extraocular Movements: Extraocular movements intact.     Conjunctiva/sclera: Conjunctivae normal.  Cardiovascular:     Rate and Rhythm: Normal rate and regular rhythm.     Heart sounds: No murmur heard.    No gallop.  Pulmonary:     Effort: Pulmonary effort is normal.     Breath sounds: No wheezing, rhonchi or rales.  Abdominal:     General: Abdomen is flat. Bowel sounds are normal. There is no distension.     Palpations: Abdomen is soft. There is no mass.     Tenderness: There is no abdominal tenderness.     Hernia: No hernia is present.  Musculoskeletal:     Right lower leg: No edema.     Left lower leg: No edema.  Skin:    General:  Skin is warm and dry.  Neurological:     General: No focal deficit present.     Mental Status: She is alert and oriented to person, place, and time.  Psychiatric:        Mood and Affect: Mood normal.        Behavior: Behavior normal.     No results found for any visits on 04/04/23.      Assessment & Plan:  Person with feared complaint, no diagnosis made  Normal abdominal exam   Patient stable today. Normal exam, normal heart sounds, no murmurs, lungs clear to auscultation bilaterally, abdomen soft and non-tender, no masses or hernias appreciated. No erythema, discharge, or signs of infection noted. Patient reassured and advised to follow up if she experiences abdominal pain, fever, change in bowel habits, fever, drainage from navel, or for new concerns.   Handicap form filled out today for DMV.   Return if symptoms worsen or fail to improve.  Toni Amend Aniyla Harling, PA-C

## 2023-04-10 ENCOUNTER — Ambulatory Visit (INDEPENDENT_AMBULATORY_CARE_PROVIDER_SITE_OTHER): Payer: Medicare Other | Admitting: Otolaryngology

## 2023-04-11 ENCOUNTER — Telehealth (HOSPITAL_COMMUNITY): Payer: Self-pay | Admitting: *Deleted

## 2023-04-11 NOTE — Telephone Encounter (Signed)
Contacted patient about scheduling OP MBS, she has continued to delay scheduling. She requested that I cancel the order. She will need new order to schedule this test going forward. RKEEL

## 2023-04-15 ENCOUNTER — Telehealth: Payer: Self-pay | Admitting: Internal Medicine

## 2023-04-15 NOTE — Telephone Encounter (Signed)
 Pt is scheduled for procedure with RMR on 3/5. She brought in her new insurance card and they are in her chart now. She is worried it will mess up her  procedure date due to the change. Just an [place]

## 2023-04-15 NOTE — Telephone Encounter (Signed)
 Advised pt that I checked both insurances and prior authorization is not required. Pt verbalized understanding.

## 2023-04-15 NOTE — Telephone Encounter (Signed)
 LMTRC

## 2023-04-17 NOTE — Patient Instructions (Signed)
 Deborah Gray  04/17/2023     @PREFPERIOPPHARMACY @   Your procedure is scheduled on  04/23/2023.   Report to [facility] at  0930 A.M.   Call this number if you have problems the morning of surgery:  [phone]  If you experience any cold or flu symptoms such as cough, fever, chills, shortness of breath, etc. between now and your scheduled surgery, please notify us at the above number.   Remember:        DO NOT take any medications for diabetes the morning of your procedure.   Follow the diet instructions given to you by the office.   You may drink clear liquids until 0730 am on 04/23/2023.       Clear liquids allowed are:                    Water, Juice (No red color; non-citric and without pulp; diabetics please choose diet or no sugar options), Carbonated beverages (diabetics please choose diet or no sugar options), Clear Tea (No creamer, milk, or cream, including half & half and powdered creamer), Black Coffee Only (No creamer, milk or cream, including half & half and powdered creamer), and Clear Sports drink (No red color; diabetics please choose diet or no sugar options)    Take these medicines the morning of surgery with A SIP OF WATER      librax(if needed), meclizine(if needed), rabeprazole.     Do not wear jewelry, make-up or nail polish, including gel polish,  artificial nails, or any other type of covering on natural nails (fingers and  toes).  Do not wear lotions, powders, or perfumes, or deodorant.  Do not shave 48 hours prior to surgery.  Men may shave face and neck.  Do not bring valuables to the hospital.  [facility] is not responsible for any belongings or valuables.  Contacts, dentures or bridgework may not be worn into surgery.  Leave your suitcase in the car.  After surgery it may be brought to your room.  For patients admitted to the hospital, discharge time will be determined by your treatment team.  Patients discharged the day of  surgery will not be allowed to drive home and must have someone with them for 24 hours.    Special instructions:   DO NOT smoke tobacco or vape for 24 hours before your procedure.  Please read over the following fact sheets that you were given. Anesthesia Post-op Instructions and Care and Recovery After Surgery      Upper Endoscopy, Adult, Care After After the procedure, it is common to have a sore throat. It is also common to have: Mild stomach pain or discomfort. Bloating. Nausea. Follow these instructions at home: The instructions below may help you care for yourself at home. Your health care provider may give you more instructions. If you have questions, ask your health care provider. If you were given a sedative during the procedure, it can affect you for several hours. Do not drive or operate machinery until your health care provider says that it is safe. If you will be going home right after the procedure, plan to have a responsible adult: Take you home from the hospital or clinic. You will not be allowed to drive. Care for you for the time you are told. Follow instructions from your health care provider about what you may eat and drink. Return to your normal activities as told by  your health care provider. Ask your health care provider what activities are safe for you. Take over-the-counter and prescription medicines only as told by your health care provider. Contact a health care provider if you: Have a sore throat that lasts longer than one day. Have trouble swallowing. Have a fever. Get help right away if you: Vomit blood or your vomit looks like coffee grounds. Have bloody, black, or tarry stools. Have a very bad sore throat or you cannot swallow. Have difficulty breathing or very bad pain in your chest or abdomen. These symptoms may be an emergency. Get help right away. Call 911. Do not wait to see if the symptoms will go away. Do not drive yourself to the  hospital. Summary After the procedure, it is common to have a sore throat, mild stomach discomfort, bloating, and nausea. If you were given a sedative during the procedure, it can affect you for several hours. Do not drive until your health care provider says that it is safe. Follow instructions from your health care provider about what you may eat and drink. Return to your normal activities as told by your health care provider. This information is not intended to replace advice given to you by your health care provider. Make sure you discuss any questions you have with your health care provider. Document Revised: 05/16/2021 Document Reviewed: 05/16/2021 Elsevier Patient Education  2024 [employer].Esophageal Dilatation Esophageal dilatation, or dilation, is done to stretch a blocked or narrowed part of your esophagus. The esophagus is the part of your body that moves food from your mouth to your stomach. You may need to have it stretched if: You have a lot of scar tissue and it makes it hard or painful to swallow. You have cancer of the esophagus. There's a problem with how food moves through your esophagus. In some cases, you may need to have this procedure done more than once. Tell a health care provider about: Any allergies you have. All medicines you're taking, including vitamins, herbs, eye drops, creams, and over-the-counter medicines. Any problems you or family members have had with anesthesia. Any bleeding problems you have. Any surgeries you've had. Any medical conditions you have. Whether you're pregnant or may be pregnant. What are the risks? Your health care provider will talk with you about risks. These may include: Bleeding. A hole or tear in your esophagus. What happens before the procedure? When to stop eating and drinking Follow instructions from your provider about what you may eat and drink. These may include: 8 hours before your procedure Stop eating most foods.  Do not eat meat, fried foods, or fatty foods. Eat only light foods, such as toast or crackers. All liquids are okay except energy drinks and alcohol. 6 hours before your procedure Stop eating. Drink only clear liquids, such as water, clear fruit juice, black coffee, plain tea, and sports drinks. Do not drink energy drinks or alcohol. 2 hours before your procedure Stop drinking all liquids. You may be allowed to take medicines with small sips of water. If you don't follow your provider's instructions, your procedure may be delayed or canceled. Medicines Ask your provider about: Changing or stopping your regular medicines. These include any diabetes medicines or blood thinners you take. Taking medicines such as aspirin and ibuprofen. These medicines can thin your blood. Do not take them unless your provider tells you to. Taking over-the-counter medicines, vitamins, herbs, and supplements. General instructions If you'll be going home right after the procedure, plan to  have a responsible adult: Take you home from the hospital or clinic. You won't be allowed to drive. Care for you for the time you're told. What happens during the procedure? You may be given: A sedative. This helps you relax. Anesthesia. This keeps you from feeling pain. It will numb certain areas of your body. The stretching may be done with: Simple dilators. These are tools put in your esophagus to stretch it. Guide wires. These wires are put in using a tube called an endoscope. A dilator is put over the wires to stretch your esophagus. Then the wires are taken out. A balloon. The balloon is on the end of a tube. It's inflated to stretch your esophagus. The procedure may vary among providers and hospitals. What can I expect after the procedure? Your blood pressure, heart rate, breathing rate, and blood oxygen level will be monitored until you leave the hospital or clinic. Your throat may feel sore and numb. This will get  better over time. You won't be allowed to eat or drink until your throat is no longer numb. You may be able to go home when you can: Drink. Pee. Sit on the edge of the bed without nausea or dizziness. Follow these instructions at home: Activity If you were given a sedative during the procedure, it can affect you for several hours. Do not drive or operate machinery until your provider says it's safe. Return to your normal activities as told by your provider. Ask your provider what activities are safe for you. General instructions Take over-the-counter and prescription medicines only as told by your provider. Follow instructions from your provider about what you may eat and drink. Do not use any products that contain nicotine or tobacco. These products include cigarettes, chewing tobacco, and vaping devices, such as e-cigarettes. If you need help quitting, ask your provider. Keep all follow-up visits. Your provider will make sure the procedure worked. Where to find more information American Society for Gastrointestinal Endoscopy (ASGE): asge.org Contact a health care provider if: You have trouble swallowing. You have a fever. Your pain doesn't get better with medicine. Get help right away if: You have chest pain. You have trouble breathing. You vomit blood. Your poop is: Black. Tarry. Bloody. These symptoms may be an emergency. Get help right away. Call 911. Do not wait to see if the symptoms will go away. Do not drive yourself to the hospital. This information is not intended to replace advice given to you by your health care provider. Make sure you discuss any questions you have with your health care provider. Document Revised: 05/03/2022 Document Reviewed: 05/03/2022 Elsevier Patient Education  2024 Elsevier Inc.Monitored Anesthesia Care, Care After The following information offers guidance on how to care for yourself after your procedure. Your health care provider may also give  you more specific instructions. If you have problems or questions, contact your health care provider. What can I expect after the procedure? After the procedure, it is common to have: Tiredness. Little or no memory about what happened during or after the procedure. Impaired judgment when it comes to making decisions. Nausea or vomiting. Some trouble with balance. Follow these instructions at home: For the time period you were told by your health care provider:  Rest. Do not participate in activities where you could fall or become injured. Do not drive or use machinery. Do not drink alcohol. Do not take sleeping pills or medicines that cause drowsiness. Do not make important decisions or sign legal documents.  Do not take care of children on your own. Medicines Take over-the-counter and prescription medicines only as told by your health care provider. If you were prescribed antibiotics, take them as told by your health care provider. Do not stop using the antibiotic even if you start to feel better. Eating and drinking Follow instructions from your health care provider about what you may eat and drink. Drink enough fluid to keep your urine pale yellow. If you vomit: Drink clear fluids slowly and in small amounts as you are able. Clear fluids include water, ice chips, low-calorie sports drinks, and fruit juice that has water added to it (diluted fruit juice). Eat light and bland foods in small amounts as you are able. These foods include bananas, applesauce, rice, lean meats, toast, and crackers. General instructions  Have a responsible adult stay with you for the time you are told. It is important to have someone help care for you until you are awake and alert. If you have sleep apnea, surgery and some medicines can increase your risk for breathing problems. Follow instructions from your health care provider about wearing your sleep device: When you are sleeping. This includes during  daytime naps. While taking prescription pain medicines, sleeping medicines, or medicines that make you drowsy. Do not use any products that contain nicotine or tobacco. These products include cigarettes, chewing tobacco, and vaping devices, such as e-cigarettes. If you need help quitting, ask your health care provider. Contact a health care provider if: You feel nauseous or vomit every time you eat or drink. You feel light-headed. You are still sleepy or having trouble with balance after 24 hours. You get a rash. You have a fever. You have redness or swelling around the IV site. Get help right away if: You have trouble breathing. You have new confusion after you get home. These symptoms may be an emergency. Get help right away. Call 911. Do not wait to see if the symptoms will go away. Do not drive yourself to the hospital. This information is not intended to replace advice given to you by your health care provider. Make sure you discuss any questions you have with your health care provider. Document Revised: 07/02/2021 Document Reviewed: 07/02/2021 Elsevier Patient Education  2024 [employer].

## 2023-04-21 ENCOUNTER — Encounter (HOSPITAL_COMMUNITY)
Admission: RE | Admit: 2023-04-21 | Discharge: 2023-04-21 | Disposition: A | Discharge status: Home / Self Care | Payer: Medicare Other | Source: Ambulatory Visit | Attending: Internal Medicine | Admitting: Internal Medicine

## 2023-04-21 ENCOUNTER — Other Ambulatory Visit: Payer: Self-pay

## 2023-04-21 VITALS — BP 128/56 | HR 88 | Temp 98.2°F | Ht 64.0 in | Wt 142.4 lb

## 2023-04-21 DIAGNOSIS — R9431 Abnormal electrocardiogram [ECG] [EKG]: Secondary | ICD-10-CM | POA: Insufficient documentation

## 2023-04-21 DIAGNOSIS — Z01810 Encounter for preprocedural cardiovascular examination: Secondary | ICD-10-CM | POA: Diagnosis not present

## 2023-04-21 DIAGNOSIS — I10 Essential (primary) hypertension: Secondary | ICD-10-CM | POA: Insufficient documentation

## 2023-04-21 DIAGNOSIS — E119 Type 2 diabetes mellitus without complications: Secondary | ICD-10-CM | POA: Diagnosis not present

## 2023-04-21 DIAGNOSIS — Z01818 Encounter for other preprocedural examination: Secondary | ICD-10-CM | POA: Diagnosis not present

## 2023-04-23 ENCOUNTER — Ambulatory Visit (HOSPITAL_COMMUNITY)
Admission: RE | Admit: 2023-04-23 | Discharge: 2023-04-23 | Disposition: A | Discharge status: Home / Self Care | Payer: Medicare Other | Source: Ambulatory Visit | Attending: Internal Medicine | Admitting: Internal Medicine

## 2023-04-23 ENCOUNTER — Encounter (HOSPITAL_COMMUNITY)
Admission: RE | Disposition: A | Discharge status: Home / Self Care | Payer: Self-pay | Source: Ambulatory Visit | Attending: Internal Medicine

## 2023-04-23 ENCOUNTER — Ambulatory Visit (HOSPITAL_COMMUNITY): Admitting: Anesthesiology

## 2023-04-23 DIAGNOSIS — R1310 Dysphagia, unspecified: Secondary | ICD-10-CM

## 2023-04-23 DIAGNOSIS — K222 Esophageal obstruction: Secondary | ICD-10-CM | POA: Insufficient documentation

## 2023-04-23 DIAGNOSIS — E119 Type 2 diabetes mellitus without complications: Secondary | ICD-10-CM | POA: Diagnosis not present

## 2023-04-23 DIAGNOSIS — I10 Essential (primary) hypertension: Secondary | ICD-10-CM | POA: Diagnosis not present

## 2023-04-23 HISTORY — PX: ESOPHAGOGASTRODUODENOSCOPY (EGD) WITH PROPOFOL: SHX5813

## 2023-04-23 HISTORY — PX: MALONEY DILATION: SHX5535

## 2023-04-23 LAB — GLUCOSE, CAPILLARY: Glucose-Capillary: 118 mg/dL — ABNORMAL HIGH (ref 70–99)

## 2023-04-23 SURGERY — ESOPHAGOGASTRODUODENOSCOPY (EGD) WITH PROPOFOL
Anesthesia: General

## 2023-04-23 MED ORDER — LACTATED RINGERS IV SOLN: INTRAVENOUS | Status: DC

## 2023-04-23 MED ORDER — PROPOFOL 10 MG/ML IV BOLUS
INTRAVENOUS | Status: DC | PRN
  Administered 2023-04-23: 10 mg via INTRAVENOUS
  Administered 2023-04-23: 20 mg via INTRAVENOUS
  Administered 2023-04-23: 40 mg via INTRAVENOUS
  Administered 2023-04-23: 20 mg via INTRAVENOUS
  Administered 2023-04-23: 30 mg via INTRAVENOUS

## 2023-04-23 MED ORDER — LIDOCAINE HCL (CARDIAC) PF 100 MG/5ML IV SOSY
PREFILLED_SYRINGE | INTRAVENOUS | Status: DC | PRN
Start: 2023-04-23 — End: 2023-04-23
  Administered 2023-04-23: 80 mg via INTRATRACHEAL

## 2023-04-23 NOTE — Op Note (Signed)
 [facility] Patient Name: [place] Procedure Date: 04/23/2023 10:32 AM MRN: [id number] Date of Birth: [birth year] Attending MD: Gennette Pac , MD, [id number] CSN: [id number] Age: 89 Admit Type: Outpatient Procedure:                Upper GI endoscopy Indications:              Dysphagia Providers:                Gennette Pac, MD, Francoise Ceo RN, RN,                            Lennice Sites Technician, Technician Referring MD:              Medicines:                Propofol per Anesthesia Complications:            No immediate complications. Estimated Blood Loss:     Estimated blood loss was minimal. Procedure:                Pre-Anesthesia Assessment:                           - Prior to the procedure, a History and Physical                            was performed, and patient medications and                            allergies were reviewed. The patient's tolerance of                            previous anesthesia was also reviewed. The risks                            and benefits of the procedure and the sedation                            options and risks were discussed with the patient.                            All questions were answered, and informed consent                            was obtained. Prior Anticoagulants: The patient has                            taken no anticoagulant or antiplatelet agents. ASA                            Grade Assessment: III - A patient with severe                            systemic disease. After reviewing the risks and  benefits, the patient was deemed in satisfactory                            condition to undergo the procedure.                           After obtaining informed consent, the endoscope was                            passed under direct vision. Throughout the                            procedure, the patient's blood pressure, pulse, and                            oxygen  saturations were monitored continuously. The                            GIF-H190 ([id number]) scope was introduced through the                            mouth, and advanced to the second part of duodenum.                            The upper GI endoscopy was accomplished without                            difficulty. The patient tolerated the procedure                            well. Scope In: 11:03:28 AM Scope Out: 11:12:47 AM Total Procedure Duration: 0 hours 9 minutes 19 seconds  Findings:      (2) incomplete distal tandem Schatzki's rings. Otherwise, the tubular       esophagus appeared normal. Gastric cavity empty. Gastric mucosa appeared       normal. Patent pylorus.      The duodenal bulb and second portion of the duodenum were normal. The       scope was withdrawn. Dilation was performed with a Maloney dilator with       moderate resistance at 52 Fr. The dilation site was examined following       endoscope reinsertion and showed no change. Estimated blood loss: none..       Repositioned mouthpiece and went back with a 54 French Maloney dilator       this dilator was passed to full insertion with mild resistance. A look       back revealed moderate dilation. Estimated blood loss minimal. Impression:               - Distal tandem rings. Dilated.                           -Normal stomach.                           -Normal duodenum Moderate Sedation:      Moderate (conscious) sedation was personally administered  by an       anesthesia professional. The following parameters were monitored: oxygen       saturation, heart rate, blood pressure, respiratory rate, EKG, adequacy       of pulmonary ventilation, and response to care. Recommendation:           - Patient has a contact number available for                            emergencies. The signs and symptoms of potential                            delayed complications were discussed with the                            patient. Return  to normal activities tomorrow.                            Written discharge instructions were provided to the                            patient.                           - Advance diet as tolerated.                           - Continue present medications.                           - Return to my office in 6 months. Procedure Code(s):        --- Professional ---                           [phone], Esophagogastroduodenoscopy, flexible,                            transoral; diagnostic, including collection of                            specimen(s) by brushing or washing, when performed                            (separate procedure)                           43450, Dilation of esophagus, by unguided sound or                            bougie, single or multiple passes Diagnosis Code(s):        --- Professional ---                           K22.2, Esophageal obstruction                           R13.10, Dysphagia, unspecified CPT copyright 2022 American Medical Association. All  rights reserved. The codes documented in this report are preliminary and upon coder review may  be revised to meet current compliance requirements. Gerrit Friends. Terria Deschepper, MD Gennette Pac, MD 04/23/2023 11:24:46 AM This report has been signed electronically. Number of Addenda: 0

## 2023-04-23 NOTE — Discharge Instructions (Addendum)
 EGD Discharge instructions Please read the instructions outlined below and refer to this sheet in the next few weeks. These discharge instructions provide you with general information on caring for yourself after you leave the hospital. Your doctor may also give you specific instructions. While your treatment has been planned according to the most current medical practices available, unavoidable complications occasionally occur. If you have any problems or questions after discharge, please call your doctor. ACTIVITY You may resume your regular activity but move at a slower pace for the next 24 hours.  Take frequent rest periods for the next 24 hours.  Walking will help expel (get rid of) the air and reduce the bloated feeling in your abdomen.  No driving for 24 hours (because of the anesthesia (medicine) used during the test).  You may shower.  Do not sign any important legal documents or operate any machinery for 24 hours (because of the anesthesia used during the test).  NUTRITION Drink plenty of fluids.  You may resume your normal diet.  Begin with a light meal and progress to your normal diet.  Avoid alcoholic beverages for 24 hours or as instructed by your caregiver.  MEDICATIONS You may resume your normal medications unless your caregiver tells you otherwise.  WHAT YOU CAN EXPECT TODAY You may experience abdominal discomfort such as a feeling of fullness or "gas" pains.  FOLLOW-UP Your doctor will discuss the results of your test with you.  SEEK IMMEDIATE MEDICAL ATTENTION IF ANY OF THE FOLLOWING OCCUR: Excessive nausea (feeling sick to your stomach) and/or vomiting.  Severe abdominal pain and distention (swelling).  Trouble swallowing.  Temperature over 101 F (37.8 C).  Rectal bleeding or vomiting of blood.     your esophagus was dilated today.  I was able to notch up the dilation to better stretch your esophagus.  Hopefully this will help with your swallowing   plan to see  you back in the office in 6 months   at patient request, I called Felicha Frayne [phone]  -  rolled to voicemail.  Left a message.

## 2023-04-23 NOTE — Anesthesia Preprocedure Evaluation (Signed)
 Anesthesia Evaluation  Patient identified by MRN, date of birth, ID band Patient awake    Reviewed: Allergy & Precautions, H&P , NPO status , Patient's Chart, lab work & pertinent test results, reviewed documented beta blocker date and time   History of Anesthesia Complications (+) PONV and history of anesthetic complications  Airway Mallampati: II  TM Distance: >3 FB Neck ROM: full    Dental no notable dental hx.    Pulmonary neg pulmonary ROS   Pulmonary exam normal breath sounds clear to auscultation       Cardiovascular Exercise Tolerance: Good hypertension, + Valvular Problems/Murmurs  Rhythm:regular Rate:Normal     Neuro/Psych  Neuromuscular disease  negative psych ROS   GI/Hepatic Neg liver ROS,GERD  ,,  Endo/Other  diabetes    Renal/GU Renal disease  negative genitourinary   Musculoskeletal   Abdominal   Peds  Hematology negative hematology ROS (+)   Anesthesia Other Findings   Reproductive/Obstetrics negative OB ROS                             Anesthesia Physical Anesthesia Plan  ASA: 2  Anesthesia Plan: General   Post-op Pain Management:    Induction:   PONV Risk Score and Plan: Propofol infusion  Airway Management Planned:   Additional Equipment:   Intra-op Plan:   Post-operative Plan:   Informed Consent: I have reviewed the patients History and Physical, chart, labs and discussed the procedure including the risks, benefits and alternatives for the proposed anesthesia with the patient or authorized representative who has indicated his/her understanding and acceptance.     Dental Advisory Given  Plan Discussed with: CRNA  Anesthesia Plan Comments:        Anesthesia Quick Evaluation

## 2023-04-23 NOTE — H&P (Signed)
 @LOGO @   Primary Care Physician:  Tommie Sams, DO Primary Gastroenterologist:  Dr. Jena Gauss  Pre-Procedure History & Physical: HPI:  Deborah Gray is a 89 y.o. female here for  for further evaluation of esophageal dysphagia abnormal barium pill esophagram.  Contrast study recently revealed esophageal dysmotility impediment of transit across the aorta but more significant obstruction of the pill at the GE junction.    Prior EGD and esophageal dilation 2 years ago demonstrated a too distant with tandem rings.  Subjectively narrowed esophagus.  Biopsies negative for EOE.  Past Medical History:  Diagnosis Date   Arthritis    Benign neoplasm of colon    Constipation    Diaphragmatic hernia without mention of obstruction or gangrene    Diverticulosis of colon (without mention of hemorrhage)    Dysphagia, pharyngoesophageal phase    Esophageal reflux    Essential hypertension    Gastritis    Heart murmur    Hemorrhoids    Hyperlipidemia    Internal hemorrhoids without mention of complication    Interstitial cystitis    Left wrist fracture    PONV (postoperative nausea and vomiting)    PSVT (paroxysmal supraventricular tachycardia) (HCC)    Stricture and stenosis of esophagus    Type 2 diabetes mellitus (HCC)     Past Surgical History:  Procedure Laterality Date   ABDOMINAL HYSTERECTOMY     BACTERIAL OVERGROWTH TEST N/A 09/06/2015   Procedure: BACTERIAL OVERGROWTH TEST;  Surgeon: Corbin Ade, MD;  Location: [facility];  Service: Endoscopy;  Laterality: N/A;  800   BIOPSY  01/02/2022   Procedure: BIOPSY;  Surgeon: Corbin Ade, MD;  [facility]: [facility];  Service: Endoscopy;;   BIOPSY  11/18/2022   Procedure: BIOPSY;  Surgeon: Corbin Ade, MD;  [facility]: [facility];  Service: Endoscopy;;   BREAST LUMPECTOMY Right    benign   CATARACT EXTRACTION Bilateral 2006   Implants in both, surgeries done 6 weeks apart   CHOLECYSTECTOMY     COLONOSCOPY  03/08/02    Jarold Motto: diverticulosis, internal hemorrhoids, adenomatous colon polyp   COLONOSCOPY  01/23/04   Jarold Motto: diverticulosis, internal hemorrhoids   COLONOSCOPY  09/25/05   Jarold Motto: diverticulosis   COLONOSCOPY  07/05/09   Jarold Motto: severe diverticulosis   COLONOSCOPY  01/21/11   Jarold Motto: severe diverticulosis in sigmoid to desc colon, int hemorrhoids, follow up TCS in 5 years   COLONOSCOPY N/A 04/10/2016   Adelaide Pfefferkorn: diverticulosis   CYSTOSCOPY W/ URETERAL STENT PLACEMENT Right 03/15/2021   Procedure: CYSTOSCOPY WITH RETROGRADE PYELOGRAM/URETERAL STENT PLACEMENT;  Surgeon: Jannifer Hick, MD;  Location: [facility];  Service: Urology;  Laterality: Right;   CYSTOSCOPY WITH RETROGRADE PYELOGRAM, URETEROSCOPY AND STENT PLACEMENT Right 03/29/2021   Procedure: CYSTOSCOPY WITH RETROGRADE PYELOGRAM, URETEROSCOPY AND STENT EXCHANGE;  Surgeon: Malen Gauze, MD;  Location: [facility];  Service: Urology;  Laterality: Right;   DILATION AND CURETTAGE OF UTERUS     ESOPHAGEAL MANOMETRY  03/21/08   [place]: findings c/w [place] Esophagus   ESOPHAGOGASTRODUODENOSCOPY  03/08/02   Jarold Motto: esophageal stricture, chronic gerd, s/p dilation.    ESOPHAGOGASTRODUODENOSCOPY  01/23/04   Jarold Motto: esophageal stricture, gastritis, hiatal hernia   ESOPHAGOGASTRODUODENOSCOPY  09/25/05   Jarold Motto: gastritis, benign bx, no H.pylori   ESOPHAGOGASTRODUODENOSCOPY  07/05/09   Jarold Motto: gastropathy, benign small bowel bx and gastric bx   ESOPHAGOGASTRODUODENOSCOPY N/A 05/15/2015   Dr. Jena Gauss- diffuse moderate inflammation characterized by congestion (edema), erythema, and linear erosions was found in the  entire examined stomach. bx= reactive gastropathy   ESOPHAGOGASTRODUODENOSCOPY (EGD) WITH PROPOFOL N/A 01/02/2022   Procedure: ESOPHAGOGASTRODUODENOSCOPY (EGD) WITH PROPOFOL;  Surgeon: Corbin Ade, MD;  [facility];  Service: Endoscopy;  Laterality: N/A;  10:30am, asa 2   FLEXIBLE SIGMOIDOSCOPY N/A 11/18/2022    Procedure: FLEXIBLE SIGMOIDOSCOPY;  Surgeon: Corbin Ade, MD;  [facility]: [facility];  Service: Endoscopy;  Laterality: N/A;  10:00 am, asa 2   FOOT SURGERY Left    HEMORRHOID BANDING  2017   Dr.Samil Mecham   HOLMIUM LASER APPLICATION Right 03/29/2021   Procedure: HOLMIUM LASER APPLICATION;  Surgeon: Malen Gauze, MD;  Location: [facility];  Service: Urology;  Laterality: Right;   LAPAROSCOPIC VAGINAL HYSTERECTOMY     MALONEY DILATION N/A 01/02/2022   Procedure: Elease Hashimoto DILATION;  Surgeon: Corbin Ade, MD;  [facility]: [facility];  Service: Endoscopy;  Laterality: N/A;   ORIF WRIST FRACTURE Left 06/23/2018   Procedure: OPEN REDUCTION INTERNAL FIXATION (ORIF) LEFT WRIST FRACTURE;  Surgeon: Sheral Apley, MD;  Location: [facility];  Service: Orthopedics;  Laterality: Left;  regional arm block   RECTOCELE REPAIR     TOTAL KNEE ARTHROPLASTY Right     Prior to Admission medications   Medication Sig Start Date End Date Taking? Authorizing Provider  ascorbic acid (VITAMIN C) 500 MG tablet Take 500 mg by mouth daily.   Yes [provider]  aspirin EC 81 MG tablet Take 81 mg by mouth at bedtime.   Yes [provider]  cholecalciferol (VITAMIN D3) 25 MCG (1000 UNIT) tablet Take 1,000 Units by mouth at bedtime.   Yes [provider]  clidinium-chlordiazePOXIDE (LIBRAX) 5-2.5 MG capsule Take 1 capsule by mouth 3 (three) times daily as needed (for esophageal symptoms). 10/22/22  Yes Tiffany Kocher, PA-C  ezetimibe (ZETIA) 10 MG tablet TAKE 1 TABLET BY MOUTH EVERY DAY 01/21/23  Yes Jonelle Sidle, MD  famotidine (PEPCID) 20 MG tablet Take 1 tablet (20 mg total) by mouth daily with supper. 12/23/22  Yes Tiffany Kocher, PA-C  hyoscyamine (LEVSIN SL) 0.125 MG SL tablet USE 1 TABLET UNDER THE TONGUE BEFORE MEALS AND AT BEDTIME AS NEEDED FOR FLARES IN SYMPTOMS, DIARRHEA/ABDOMINAL CRAMPING 07/17/22  Yes Gelene Mink, NP  losartan (COZAAR) 50 MG tablet TAKE 1  TABLET BY MOUTH EVERY DAY 09/27/22  Yes Jonelle Sidle, MD  metFORMIN (GLUCOPHAGE) 500 MG tablet TAKE ONE TABLET BY MOUTH EVERY MORNING ONE AT LUNCH AND 2 AT BEDTIME 03/21/23  Yes Cook, Jayce G, DO  mirabegron ER (MYRBETRIQ) 50 MG TB24 tablet Take 1 tablet (50 mg total) by mouth daily. 10/24/22  Yes Donnita Falls, FNP  Multiple Vitamins-Minerals (PRESERVISION AREDS 2) CAPS Take 1 capsule by mouth 2 (two) times daily.    Yes [provider]  nateglinide (STARLIX) 60 MG tablet Take 1 tablet (60 mg total) by mouth 3 (three) times daily with meals. 01/06/23  Yes Cook, Jayce G, DO  omega-3 acid ethyl esters (LOVAZA) 1 g capsule Take 2 g by mouth every morning.   Yes [provider]  Pancrelipase, Lip-Prot-Amyl, (CREON) 24000-76000 units CPEP TAKE 3 CAPSULES THREE TIMES DAILY WITH MEALS AND ONE CAPSULE WITH SNACK TWICE DAILY 05/27/22  Yes Tiffany Kocher, PA-C  RABEprazole (ACIPHEX) 20 MG tablet TAKE 1 TABLET BY MOUTH EVERY DAY 12/16/22  Yes Tiffany Kocher, PA-C  acetaminophen (TYLENOL) 500 MG tablet Take 500 mg by mouth at bedtime.  [provider]  Carboxymethylcellulose Sodium (THERATEARS) 0.25 % SOLN Place 1 drop into both eyes 2 (two) times daily.    [provider]  Guaifenesin 1200 MG TB12 Take 1,200 mg by mouth daily with supper.    [provider]  ipratropium (ATROVENT) 0.03 % nasal spray Place 2 sprays into both nostrils every 12 (twelve) hours. 02/06/23   Ashok Croon, MD  meclizine (ANTIVERT) 25 MG tablet Take 1 tablet (25 mg total) by mouth 3 (three) times daily as needed. 01/27/23   Tommie Sams, DO  methenamine (HIPREX) 1 g tablet TAKE 1 TABLET BY MOUTH TWICE A DAY 09/17/22   McKenzie, Mardene Celeste, MD  mometasone (NASONEX) 50 MCG/ACT nasal spray Place 2 sprays into the nose daily. 02/06/23   Ashok Croon, MD    Allergies as of 03/24/2023 - Review Complete 02/25/2023  Allergen Reaction Noted   Adhesive [tape] Other (See Comments)  07/21/2016   Estrogens Other (See Comments) 10/15/2012   Other Other (See Comments) 09/01/2013   Sulfonamide derivatives Rash 03/01/2008    Family History  Problem Relation Age of Onset   Ovarian cancer Mother    Diabetes Father    Heart disease Father    Breast cancer Sister    Liver cancer Sister    Colon cancer Cousin        Paternal side   Diabetes Maternal Aunt    Liver disease Cousin        Maternal side, never drank    Social History   Socioeconomic History   Marital status: Widowed    Spouse name: Not on file   Number of children: 3   Years of education: Not on file   Highest education level: Not on file  Occupational History   Occupation: Retired  Tobacco Use   Smoking status: Never   Smokeless tobacco: Never   Tobacco comments:    Never smoked  Vaping Use   Vaping status: Never Used  Substance and Sexual Activity   Alcohol use: No    Alcohol/week: 0.0 standard drinks of alcohol   Drug use: No   Sexual activity: Not Currently  Other Topics Concern   Not on file  Social History Narrative   Widowed since 07/2018. Married x 61 years.   3 sons. 59, 1963 and 1966.   Social Drivers of Corporate investment banker Strain: Low Risk  (05/31/2022)   Overall Financial Resource Strain (CARDIA)    Difficulty of Paying Living Expenses: Not hard at all  Food Insecurity: No Food Insecurity (05/31/2022)   Hunger Vital Sign    Worried About Running Out of Food in the Last Year: Never true    Ran Out of Food in the Last Year: Never true  Transportation Needs: No Transportation Needs (05/31/2022)   PRAPARE - Administrator, Civil Service (Medical): No    Lack of Transportation (Non-Medical): No  Physical Activity: Sufficiently Active (05/31/2022)   Exercise Vital Sign    Days of Exercise per Week: 5 days    Minutes of Exercise per Session: 30 min  Stress: No Stress Concern Present (05/31/2022)   [employer] of Occupational Health - Occupational  Stress Questionnaire    Feeling of Stress : Not at all  Social Connections: Moderately Integrated (05/31/2022)   Social Connection and Isolation Panel [NHANES]    Frequency of Communication with Friends and Family: More than three times a week    Frequency of Social Gatherings with Friends and  Family: More than three times a week    Attends Religious Services: More than 4 times per year    Active Member of Clubs or Organizations: Yes    Attends Banker Meetings: More than 4 times per year    Marital Status: Widowed  Intimate Partner Violence: Not At Risk (05/31/2022)   Humiliation, Afraid, Rape, and Kick questionnaire    Fear of Current or Ex-Partner: No    Emotionally Abused: No    Physically Abused: No    Sexually Abused: No    Review of Systems: See HPI, otherwise negative ROS  Physical Exam: BP 132/72 (BP Location: Right Arm)   Pulse 85   Temp 98.7 F (37.1 C)   Resp 20   SpO2 97%  General:   Alert,  Well-developed, well-nourished, pleasant and cooperative in NAD Neck:  Supple; no masses or thyromegaly. No significant cervical adenopathy. Lungs:  Clear throughout to auscultation.   No wheezes, crackles, or rhonchi. No acute distress. Heart:  Regular rate and rhythm; no murmurs, clicks, rubs,  or gallops. Abdomen: Non-distended, normal bowel sounds.  Soft and nontender without appreciable mass or hepatosplenomegaly.   Impression/Plan:    89 year old lady with recurrent esophageal dysphagia.  Mechanical narrowing at the GE junction with esophageal dysmotility transient hold-up at the level of the aorta likely less clinically significant.  Agree with need for EGD with esophageal dilation is feasible/appropriate.  This has been offered to the patient. The risks, benefits, limitations, alternatives and imponderables have been reviewed with the patient. Questions have been answered. All parties are agreeable.       Notice: This dictation was prepared with Dragon  dictation along with smaller phrase technology. Any transcriptional errors that result from this process are unintentional and may not be corrected upon review.

## 2023-04-23 NOTE — Transfer of Care (Signed)
 Immediate Anesthesia Transfer of Care Note  Patient: [place]  Procedure(s) Performed: ESOPHAGOGASTRODUODENOSCOPY (EGD) WITH PROPOFOL DILATION, ESOPHAGUS, USING MALONEY DILATOR  Patient Location: PACU and Short Stay  Anesthesia Type:General  Level of Consciousness: awake, alert , and oriented  Airway & Oxygen Therapy: Patient Spontanous Breathing  Post-op Assessment: Report given to RN and Post -op Vital signs reviewed and stable  Post vital signs: Reviewed and stable  Last Vitals:  Vitals Value Taken Time  BP 104/65 04/23/23 1128  Temp 36.4 C 04/23/23 1126  Pulse 87 04/23/23 1128  Resp 20 04/23/23 1128  SpO2 97 % 04/23/23 1128    Last Pain:  Vitals:   04/23/23 1128  TempSrc: Oral  PainSc: 0-No pain         Complications: No notable events documented.

## 2023-04-24 ENCOUNTER — Ambulatory Visit: Payer: 59

## 2023-04-24 ENCOUNTER — Telehealth: Payer: Self-pay

## 2023-04-24 ENCOUNTER — Ambulatory Visit: Payer: Medicare Other | Admitting: Urology

## 2023-04-24 ENCOUNTER — Encounter (HOSPITAL_COMMUNITY): Payer: Self-pay | Admitting: Internal Medicine

## 2023-04-24 ENCOUNTER — Other Ambulatory Visit

## 2023-04-24 DIAGNOSIS — N3000 Acute cystitis without hematuria: Secondary | ICD-10-CM | POA: Diagnosis not present

## 2023-04-24 NOTE — Anesthesia Postprocedure Evaluation (Signed)
 Anesthesia Post Note  Patient: [name]  Procedure(s) Performed: ESOPHAGOGASTRODUODENOSCOPY (EGD) WITH PROPOFOL DILATION, ESOPHAGUS, USING MALONEY DILATOR  Patient location during evaluation: Phase II Anesthesia Type: General Level of consciousness: awake Pain management: pain level controlled Vital Signs Assessment: post-procedure vital signs reviewed and stable Respiratory status: spontaneous breathing and respiratory function stable Cardiovascular status: blood pressure returned to baseline and stable Postop Assessment: no headache and no apparent nausea or vomiting Anesthetic complications: no Comments: Late entry   No notable events documented.   Last Vitals:  Vitals:   04/23/23 1126 04/23/23 1128  BP:  104/65  Pulse:  87  Resp:  20  Temp: 36.4 C   SpO2:  97%    Last Pain:  Vitals:   04/23/23 1128  TempSrc: Oral  PainSc: 0-No pain                 Windell Norfolk

## 2023-04-24 NOTE — Progress Notes (Unsigned)
 Patient made aware of negative ua results per NP.

## 2023-04-24 NOTE — Telephone Encounter (Signed)
 Dysuria  Patient called with c/o dysuria x 2-3 days days.  Pain: burning  Severity:6/10  Associated Signs and Symptoms:  Fever: noTemp. Chills: no Hematuria: no Urgency: yes Frequency: yes Hesitancy:no Incontinence: no Nausea: no Vomiting: no  Urologic History:  Any Recent Urologic Surgeries or Procedures:no Recurrent UTI's:yes Cystitis: yes  Prostatitis:no Kidney or Bladder Stones: yes Plan: Walk-in Clinic: no Appointment w/Physician: [no Lab visit scheduled for urine drop off: Yes Advice given:  Do you take on daily medications for UTI suppression Yes  Methenamine

## 2023-04-24 NOTE — Addendum Note (Signed)
 Addended by: Ferdinand Lango on: 04/24/2023 11:59 AM   Modules accepted: Orders

## 2023-04-25 LAB — URINALYSIS, ROUTINE W REFLEX MICROSCOPIC
Bilirubin, UA: NEGATIVE
Glucose, UA: NEGATIVE
Ketones, UA: NEGATIVE
Nitrite, UA: NEGATIVE
Protein,UA: NEGATIVE
RBC, UA: NEGATIVE
Specific Gravity, UA: 1.015 (ref 1.005–1.030)
Urobilinogen, Ur: 0.2 mg/dL (ref 0.2–1.0)
pH, UA: 6.5 (ref 5.0–7.5)

## 2023-04-25 LAB — MICROSCOPIC EXAMINATION

## 2023-04-26 LAB — URINE CULTURE

## 2023-04-28 ENCOUNTER — Ambulatory Visit: Payer: 59

## 2023-04-29 DIAGNOSIS — H34832 Tributary (branch) retinal vein occlusion, left eye, with macular edema: Secondary | ICD-10-CM | POA: Diagnosis not present

## 2023-04-30 ENCOUNTER — Ambulatory Visit (INDEPENDENT_AMBULATORY_CARE_PROVIDER_SITE_OTHER)

## 2023-04-30 DIAGNOSIS — N3000 Acute cystitis without hematuria: Secondary | ICD-10-CM

## 2023-04-30 DIAGNOSIS — N3010 Interstitial cystitis (chronic) without hematuria: Secondary | ICD-10-CM | POA: Diagnosis not present

## 2023-04-30 MED ORDER — HYDROCORTISONE SOD SUC (PF) 100 MG IJ SOLR
100.0000 mg | Freq: Once | INTRAMUSCULAR | Status: AC
  Administered 2023-04-30: 100 mg

## 2023-04-30 MED ORDER — SODIUM BICARBONATE 8.4 % IV SOLN
50.0000 meq | Freq: Once | INTRAVENOUS | Status: AC
  Administered 2023-04-30: 50 meq

## 2023-04-30 MED ORDER — DIMETHYL SULFOXIDE 50 % IS SOLN
50.0000 mL | Freq: Once | INTRAVESICAL | Status: AC
  Administered 2023-04-30: 50 mL via URETHRAL

## 2023-04-30 MED ORDER — LIDOCAINE HCL 2 % IJ SOLN
10.0000 mL | Freq: Once | INTRAMUSCULAR | Status: AC
  Administered 2023-04-30: 200 mg

## 2023-04-30 NOTE — Progress Notes (Signed)
 Bladder Instillation DMSO  Due to Interstitial Cystitis patient is present today for a Bladder Instillation of DMSO treatment. Patient was cleaned and prepped in a sterile fashion with Betadinex3.  A 18 FR catheter was inserted, urine return was noted 200 ml, urine was Clear yellow In color.  DMSO treatment was instilled into the bladder and patient stay in office for 20 minutes. The catheter was then removed. Patient tolerated well, no complications were noted pt was instructed to hold the instillation for 20 minutes and then will void it out. Pt voiced understanding.    Preformed by: Kennyth Lose, CMA  Follow up/ Additional notes: Keep nurse visited

## 2023-05-01 DIAGNOSIS — E1142 Type 2 diabetes mellitus with diabetic polyneuropathy: Secondary | ICD-10-CM | POA: Diagnosis not present

## 2023-05-01 DIAGNOSIS — B351 Tinea unguium: Secondary | ICD-10-CM | POA: Diagnosis not present

## 2023-05-01 LAB — URINALYSIS, ROUTINE W REFLEX MICROSCOPIC
Bilirubin, UA: NEGATIVE
Glucose, UA: NEGATIVE
Ketones, UA: NEGATIVE
Leukocytes,UA: NEGATIVE
Nitrite, UA: NEGATIVE
Protein,UA: NEGATIVE
RBC, UA: NEGATIVE
Specific Gravity, UA: 1.02 (ref 1.005–1.030)
Urobilinogen, Ur: 0.2 mg/dL (ref 0.2–1.0)
pH, UA: 6 (ref 5.0–7.5)

## 2023-05-02 ENCOUNTER — Telehealth: Payer: Self-pay

## 2023-05-02 ENCOUNTER — Telehealth: Payer: Self-pay | Admitting: Internal Medicine

## 2023-05-02 NOTE — Telephone Encounter (Signed)
 Called and spoke with patient she just wanted to know if she need to have an x-ray prior to her appt. , she stated the only issue that she is having is the horsiness she has follow up with ENT and was told that it could be age related however it doesn't happen all the time. Pt just want to follow with you to see if she need to have x-ray done to discuss this at her appt. She stated that she is okay with waiting until after she is seen. Please advise

## 2023-05-02 NOTE — Telephone Encounter (Signed)
 Recommend trying TUMS, take per package label as needed. Continue rabeprazole and famotidine. Call if symptoms don't settle down.

## 2023-05-02 NOTE — Telephone Encounter (Signed)
 Pt was made aware and verbalized understanding.

## 2023-05-02 NOTE — Telephone Encounter (Signed)
 Pt called stating that since having her esophagus stretched she has been having issues with burning. Pt is currently taking rabeprazole 20 mg once daily and famotidine once daily at supper.

## 2023-05-02 NOTE — Telephone Encounter (Signed)
 Chart reviewed: No need for cxr

## 2023-05-02 NOTE — Telephone Encounter (Signed)
 Patient would like to know if she needs xray. Patient phone number is [phone].

## 2023-05-05 DIAGNOSIS — L218 Other seborrheic dermatitis: Secondary | ICD-10-CM | POA: Diagnosis not present

## 2023-05-05 NOTE — Telephone Encounter (Signed)
 Called and spoke with patient informed  Deborah Gray Per Dr Sherene Sires she didn't need an x-ray

## 2023-05-07 ENCOUNTER — Ambulatory Visit: Payer: Medicare Other | Admitting: Urology

## 2023-05-08 ENCOUNTER — Ambulatory Visit (INDEPENDENT_AMBULATORY_CARE_PROVIDER_SITE_OTHER): Payer: Medicare Other | Admitting: Otolaryngology

## 2023-05-08 ENCOUNTER — Encounter (INDEPENDENT_AMBULATORY_CARE_PROVIDER_SITE_OTHER): Payer: Self-pay | Admitting: Otolaryngology

## 2023-05-08 VITALS — BP 168/78 | HR 86

## 2023-05-08 DIAGNOSIS — K222 Esophageal obstruction: Secondary | ICD-10-CM

## 2023-05-08 DIAGNOSIS — J383 Other diseases of vocal cords: Secondary | ICD-10-CM

## 2023-05-08 DIAGNOSIS — R053 Chronic cough: Secondary | ICD-10-CM

## 2023-05-08 DIAGNOSIS — R0981 Nasal congestion: Secondary | ICD-10-CM | POA: Diagnosis not present

## 2023-05-08 DIAGNOSIS — K219 Gastro-esophageal reflux disease without esophagitis: Secondary | ICD-10-CM

## 2023-05-08 DIAGNOSIS — R490 Dysphonia: Secondary | ICD-10-CM

## 2023-05-08 DIAGNOSIS — R0982 Postnasal drip: Secondary | ICD-10-CM | POA: Diagnosis not present

## 2023-05-08 DIAGNOSIS — J3089 Other allergic rhinitis: Secondary | ICD-10-CM

## 2023-05-08 DIAGNOSIS — R1310 Dysphagia, unspecified: Secondary | ICD-10-CM | POA: Diagnosis not present

## 2023-05-08 MED ORDER — MOMETASONE FUROATE 50 MCG/ACT NA SUSP
2.0000 | Freq: Every day | NASAL | 12 refills | Status: DC
Start: 2023-05-08 — End: 2023-10-23

## 2023-05-08 MED ORDER — IPRATROPIUM BROMIDE 0.03 % NA SOLN: 2.0000 | Freq: Two times a day (BID) | NASAL | 12 refills | Status: DC

## 2023-05-08 NOTE — Progress Notes (Signed)
 ENT Progress Note:   Update 05/08/2023  Discussed the use of AI scribe software for clinical note transcription with the patient, who gave verbal consent to proceed.  History of Present Illness   Deborah Gray is an 89 year old female who presents with swallowing difficulties and chronic cough/nasal congestion.  She has ongoing swallowing difficulties, noting that during a recent barium swallow test, a pill became lodged in her esophagus. She describes the sensation of the pill stopping at her throat and again lower down, with the radiologist noting a 'crooked and narrowed' appearance in the distal esophagus. She underwent an EGD and esophageal dilation by Dr. Jena Gauss GI last week.  She is experiencing nasal congestion and drainage, which she manages with two nasal sprays. She notes that one nasal spray (Atrovent) provides better relief, but mentions that when she rinses her nose, the solution seems to end up in her throat. She follows the recommended technique of leaning forward while rinsing. She requests refills for her nasal sprays.  She has a history of interstitial cystitis, diagnosed when her first grandchild was thirteen, and has been managing it for over two decades. She also has a long-standing history of gastrointestinal issues, including gastritis and irritable bowel syndrome, which have required her to maintain a bland diet. She underwent gallbladder surgery in the past, which was followed by persistent gnawing pain, leading to a suspicion of pancreatic issues. She was prescribed enzyme pills by a gastroenterologist.  She mentions having had multiple medical appointments recently, with three last week and two this week, due to some being postponed because of weather conditions. She is scheduled to see her pulmonary doctor, Dr. Sherene Sires, in May.       Records Reviewed:  Initial Evaluation  Reason for Consult: hoarseness and post-nasal drainage/mucus in the throat   HPI: Discussed  the use of AI scribe software for clinical note transcription with the patient, who gave verbal consent to proceed.  History of Present Illness   The patient is an 70 yoF, with a history of chronic nasal congestion, post-nasal drainage, presents with complaints of persistent nasal congestion and hoarseness with a sensation of mucus in he throat. They describe the nasal discharge as thick and glue-like, requiring significant effort to get it out and Mucinex to alleviate. The patient has been taking 1200mg  of Mucinex at night and 600mg  in the morning to manage the symptoms, but expresses dissatisfaction with the amount of medication required and sx improvement.   The patient also reports intermittent hoarseness, which seems to resolve spontaneously after a few hours. They have been using a steroid nasal spray, mometasone, to manage these symptoms.  In addition to the nasal symptoms, the patient has a history of a hiatal hernia and has experienced episodes of choking while eating. They have been prescribed Levsin by GI to manage these episodes. The patient also mentions a benign head tremor, which they have been managing without medication.  The patient has had previous imaging of the sinuses, but it has been several years since the last scan. They have also had a chest x-ray recently, which revealed a lung nodule/stable in size. The patient denies any cough unless there is significant nasal drainage.  The patient's symptoms have been ongoing for several years, and they express frustration with the persistent nature of their condition. They have been managing their symptoms with over-the-counter and prescription medications, but are seeking further evaluation and treatment options.   She has hx of esophageal spasm, IBS,  esophageal rings, esophagitis on EGD 12/2021. Per GI office notes she also has chronic pancreatitis and pancreatic insufficiency. On Aciphex currently (Rabeprazole)    Office note by Dr  Sherene Sires 12/26/22 Brief patient profile:  30 yowf never smoker  retired Geophysicist/field seismologist referred to pulmonary clinic in [place]  03/21/2022 by Dr Everlene Other  for incidental lung nodules on w/u for hematuria.    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  Patient presents with   Consult      Ct scan abnormal   Dyspnea:  [facility]/ no cooking / limited by back but still doing [employer] / [employer] parking depends on whether back bothering her  Cough: none  but always botherd by nasal congestion f/u by [employer]  Sleep: occ night sweats x 2 year / no wt loss or resp cc noct  SABA use: none  02: none  Rec You multiple nodules that the other with largest calcified typical of a benign process  To be sure, we will repeat your CT no contrast  in 3 months and see you back afterwords   - 03/21/2022  ESR 9 - 03/21/2022  Quant TB neg - 03/21/2022 cbc with diff   Eos 0.1   06/25/2022  f/u ov/[facility] office/Wert re: MP[facility]  maint on dymista / no other resp meds      Chief Complaint  Patient presents with   Follow-up      Pt f/u states that her breathing is fine  Dyspnea:  about the same / still doing grocery store  Cough: none unless active nasal drainage Sleeping: occ sweats head but not whole bed  SABA use: none 02: none  No longer losing wt  RecPlease schedule a follow up visit in 6 months but call sooner if needed with cxr on return as well    Office visit by GI PA-C Tana Coast 12/23/22 Deborah Gray is a 89 y.o. female presenting today for follow up. Last seen 10/2022. She has history of GERD, esophageal spasms/nutcracker esophagus, idiopathic chronic pancreatitis with chronic pancreatic exocrine insufficiency, IBS with intermittent diarrhea, hemorrhoids. She tries to avoid PPIs due to concerns for causing memory issues. After last EGD, has been on  aciphex 20mg  daily. Issues with abdominal pain and concern for bowel obstruction on CT in 02/2022, advised to hold off Levsin and imodium at that time. Had noted increased use to control bowels around the holidays. F/u CT looked good. Suggested she continue levsin prn but avoid imoidum use. She has had hemorrhoid banding neutrally and left lateral and anterior internal hemorrhoids several months ago.   At last ov she was having issues with fecal smearing/incomplete passage of stool. On DRE, palpable rectal abnormality. She completed flex sig which showed semicircumferential nodularity on palpation. Rectal scar involving approximately a third of the circumferential distal rectum 3cm up from anal verge. Most likely due to prior 3 hemorrhoid bands. Scars a little bit proximal to the hemorrhoids. Noted to have minimal grade 2 hemorrhoids. Rectal scar and mucosa biopsied, benign.   Today: fecal smearing better. Benefiber 1 teaspoon daily. Probiotic daily. Stools more formed. Will have to go 2-3 times before gets empty. But overall better. No melena, brbpr. No abdominal pain. Having a lot of ugi symptoms lately. Feels like food stopped in center of chest  worse in the evening. Bending over stuff comes up. Belching up sour stuff. Feels like she needs something added at night for reflux etc. Complains of early satiety. Biggest meal is lunch, usually not hungry at dinner. Not happy with food offerings at the assisted living facility.    Flex sig 10/2022: -rectal scar s/p bx, benign -minimal grade 2 hemorrhoids -distal sigmoid diverticulosis  EGD 12/2021: -Distal esophageal rings,2 tandem. Overlying distal esophageal erosion consistent with erosive reflux esophagitis. Status post dilation.  -Status post esophageal biopsy as described -Inflamed appearing stomach of uncertain significance diffusely status post biopsy normal duodenal bulb and second portion of the duodenum. -Patient not on any meaningful acid  suppression therapy. She takes pancreatic enzymes as well. Concerns about dementia risk with PPIs previously. In this setting, the benefits of acid suppression therapy with a PPI far outweighs any theoretical risks of dementia.   Colonoscopy 03/2016: -diverticulosis   Past Medical History:  Diagnosis Date   Arthritis    Benign neoplasm of colon    Constipation    Diaphragmatic hernia without mention of obstruction or gangrene    Diverticulosis of colon (without mention of hemorrhage)    Dysphagia, pharyngoesophageal phase    Esophageal reflux    Essential hypertension    Gastritis    Heart murmur    Hemorrhoids    Hyperlipidemia    Internal hemorrhoids without mention of complication    Interstitial cystitis    Left wrist fracture    PONV (postoperative nausea and vomiting)    PSVT (paroxysmal supraventricular tachycardia) (HCC)    Stricture and stenosis of esophagus    Type 2 diabetes mellitus (HCC)     Past Surgical History:  Procedure Laterality Date   ABDOMINAL HYSTERECTOMY     BACTERIAL OVERGROWTH TEST N/A 09/06/2015   Procedure: BACTERIAL OVERGROWTH TEST;  Surgeon: Corbin Ade, MD;  Location: [facility];  Service: Endoscopy;  Laterality: N/A;  800   BIOPSY  01/02/2022   Procedure: BIOPSY;  Surgeon: Corbin Ade, MD;  [facility]: [facility];  Service: Endoscopy;;   BIOPSY  11/18/2022   Procedure: BIOPSY;  Surgeon: Corbin Ade, MD;  Location: [facility];  Service: Endoscopy;;   BREAST LUMPECTOMY Right    benign   CATARACT EXTRACTION Bilateral 2006   Implants in both, surgeries done 6 weeks apart   CHOLECYSTECTOMY     COLONOSCOPY  03/08/02   Jarold Motto: diverticulosis, internal hemorrhoids, adenomatous colon polyp   COLONOSCOPY  01/23/04   Jarold Motto: diverticulosis, internal hemorrhoids   COLONOSCOPY  09/25/05   Jarold Motto: diverticulosis   COLONOSCOPY  07/05/09   Jarold Motto: severe diverticulosis   COLONOSCOPY  01/21/11   Jarold Motto: severe diverticulosis  in sigmoid to desc colon, int hemorrhoids, follow up TCS in 5 years   COLONOSCOPY N/A 04/10/2016   [name]: diverticulosis   CYSTOSCOPY W/ URETERAL STENT PLACEMENT Right 03/15/2021   Procedure: CYSTOSCOPY WITH RETROGRADE PYELOGRAM/URETERAL STENT PLACEMENT;  Surgeon: Jannifer Hick, MD;  Location: [facility];  Service: Urology;  Laterality: Right;   CYSTOSCOPY WITH RETROGRADE PYELOGRAM, URETEROSCOPY AND STENT PLACEMENT Right 03/29/2021   Procedure: CYSTOSCOPY WITH RETROGRADE PYELOGRAM, URETEROSCOPY AND STENT EXCHANGE;  Surgeon: Malen Gauze, MD;  Location: [facility];  Service: Urology;  Laterality: Right;   DILATION AND CURETTAGE OF UTERUS     ESOPHAGEAL MANOMETRY  03/21/08   Patterson: findings c/w Nutcracker Esophagus   ESOPHAGOGASTRODUODENOSCOPY  03/08/02   Jarold Motto: esophageal stricture, chronic gerd, s/p dilation.    ESOPHAGOGASTRODUODENOSCOPY  01/23/04   Patterson: esophageal stricture, gastritis, hiatal hernia   ESOPHAGOGASTRODUODENOSCOPY  09/25/05   Jarold Motto: gastritis, benign bx, no H.pylori   ESOPHAGOGASTRODUODENOSCOPY  07/05/09   Jarold Motto: gastropathy, benign small bowel bx and gastric bx   ESOPHAGOGASTRODUODENOSCOPY N/A 05/15/2015   Dr. Jena Gauss- diffuse moderate inflammation characterized by congestion (edema), erythema, and linear erosions was found in the entire examined stomach. bx= reactive gastropathy   ESOPHAGOGASTRODUODENOSCOPY (EGD) WITH PROPOFOL N/A 01/02/2022   Procedure: ESOPHAGOGASTRODUODENOSCOPY (EGD) WITH PROPOFOL;  Surgeon: Corbin Ade, MD;  [facility];  Service: Endoscopy;  Laterality: N/A;  10:30am, asa 2   ESOPHAGOGASTRODUODENOSCOPY (EGD) WITH PROPOFOL N/A 04/23/2023   Procedure: ESOPHAGOGASTRODUODENOSCOPY (EGD) WITH PROPOFOL;  Surgeon: Corbin Ade, MD;  [facility];  Service: Endoscopy;  Laterality: N/A;  1130AM, ASA 3   FLEXIBLE SIGMOIDOSCOPY N/A 11/18/2022   Procedure: FLEXIBLE SIGMOIDOSCOPY;  Surgeon: Corbin Ade, MD;  [facility];  Service: Endoscopy;  Laterality: N/A;  10:00 am, asa 2   FOOT SURGERY Left    HEMORRHOID BANDING  2017   Dr.[name]   HOLMIUM LASER APPLICATION Right 03/29/2021   Procedure: HOLMIUM LASER APPLICATION;  Surgeon: Malen Gauze, MD;  Location: [facility];  Service: Urology;  Laterality: Right;   LAPAROSCOPIC VAGINAL HYSTERECTOMY     MALONEY DILATION N/A 01/02/2022   Procedure: Elease Hashimoto DILATION;  Surgeon: Corbin Ade, MD;  [facility];  Service: Endoscopy;  Laterality: N/A;   MALONEY DILATION N/A 04/23/2023   Procedure: DILATION, ESOPHAGUS, USING MALONEY DILATOR;  Surgeon: Corbin Ade, MD;  [facility];  Service: Endoscopy;  Laterality: N/A;   ORIF WRIST FRACTURE Left 06/23/2018   Procedure: OPEN REDUCTION INTERNAL FIXATION (ORIF) LEFT WRIST FRACTURE;  Surgeon: Sheral Apley, MD;  Location: [facility];  Service: Orthopedics;  Laterality: Left;  regional arm block   RECTOCELE REPAIR     TOTAL KNEE ARTHROPLASTY Right     Family History  Problem Relation Age of Onset   Ovarian cancer Mother    Diabetes Father    Heart disease Father    Breast cancer Sister    Liver cancer Sister    Colon cancer Cousin        Paternal side   Diabetes Maternal Aunt    Liver disease Cousin        Maternal side, never drank    Social History:  reports that she has never smoked. She has never used smokeless tobacco. She reports that she does not drink alcohol and does not use drugs.  Allergies:  Allergies  Allergen Reactions   Adhesive [Tape] Other (See Comments)     Blisters    Estrogens Other (See Comments)    Migraines    Other Other (See Comments)   Sulfonamide Derivatives Rash    Medications: I have reviewed the patient's current medications.  The PMH, PSH, Medications, Allergies, and SH were reviewed and updated.  ROS: Constitutional: Negative for fever, weight loss and weight gain. Cardiovascular: Negative for chest pain and dyspnea on  exertion. Respiratory: Is not experiencing shortness of breath at rest. Gastrointestinal: Negative for nausea and vomiting. Neurological: Negative for headaches. Psychiatric: The patient is not nervous/anxious  Blood pressure (!) 168/78, pulse 86, SpO2 95%.  PHYSICAL EXAM:  Exam: General: Well-developed, well-nourished Respiratory Respiratory effort: Equal inspiration and expiration without stridor Cardiovascular Peripheral Vascular: Warm extremities with equal color/perfusion Eyes: No nystagmus with equal extraocular motion bilaterally Neuro/Psych/Balance: Patient oriented  to person, place, and time; Appropriate mood and affect; Gait is intact with no imbalance; Cranial nerves I-XII are intact Head and Face Inspection: Normocephalic and atraumatic without mass or lesion Palpation: Facial skeleton intact without bony stepoffs Salivary Glands: No mass or tenderness Facial Strength: Facial motility symmetric and full bilaterally ENT Pinna: External ear intact and fully developed External canal: Canal is patent with intact skin Tympanic Membrane: Clear and mobile External Nose: No scar or anatomic deformity Lips, Teeth, and gums: Mucosa and teeth intact and viable TMJ: No pain to palpation with full mobility Oral cavity/oropharynx: No erythema or exudate, no lesions present Neck Neck and Trachea: Midline trachea without mass or lesion Thyroid: No mass or nodularity Lymphatics: No lymphadenopathy  Studies Reviewed: 12/26/22 EXAM: CHEST - 2 VIEW   COMPARISON:  06/25/2022.   FINDINGS: Unremarkable cardiac silhouette. Bibasilar opacities stable consistent with scarring or subsegmental atelectasis. Normal pulmonary vasculature. No pneumothorax. Left lower lung nodule could be a calcified granuloma. Additional calcifications overlie the hila. Aorta is calcified. There are thoracic degenerative changes.   IMPRESSION: Bibasilar scarring or subsegmental atelectasis. Unchanged  left lower lung nodule and evidence of old granulomatous disease.  Esophagram 03/05/23 CLINICAL DATA:  Patient with intermittent dysphagia with solid foods, bloating, intermittent choking/coughing during eating. Request for esophagram for further evaluation.   EXAM: ESOPHAGUS/BARIUM SWALLOW/TABLET STUDY   TECHNIQUE: Single contrast examination was performed using thin liquid barium. This exam was performed by Lynnette Caffey, PA-C, and was supervised and interpreted by Marliss Coots, MD.   FLUOROSCOPY: Radiation Exposure Index (as provided by the fluoroscopic device): 28.20 mGy Kerma   COMPARISON:  CT abd/pelvis 03/10/22, esophagram 12/03/21.   FINDINGS: Swallowing: Appears normal. Intermittent vestibular penetration without aspiration seen.   Pharynx: Distended upper esophagus beginning near the level of the vocal cords.   Esophagus: Moderately patulous upper esophagus to the level of the aortic notch with contrast stasis in this area. There is an area at the aortic notch which becomes significantly narrow/collapses in on itself during primary peristalsis and appears to cause brief stasis of contrast in the upper esophagus however this area becomes fully distended following bolus clearance (series #5). Moderate smooth narrowing of the gastroesophageal junction.   Esophageal motility: Severe lower esophageal dysmotility. Proximal escape waves and contrast stasis seen in the upper esophagus with each bolus. Retrograde flow of contrast up to the level of the aortic notch.   Hiatal Hernia: None.   Gastroesophageal reflux: Retrograde flow of contrast noted, no true gastroesophageal reflux seen on limited exam.   Ingested 13 mm barium tablet: Became stuck just inferior to the aortic notch - patient reported presenting symptoms during this time. The tablet passed after several more sips of water before becoming stuck again at the gastroesophageal junction. The tablet was  observed for 3 minutes in this area without passage into the stomach despite additional sips of water and barium. The patient was informed that the tablet would dissolve.   Other: None.   IMPRESSION: Single contrast esophagram significant for:   1.  Intermittent vestibular penetration without aspiration.   2.  Patulous proximal esophagus.   3. Severe lower esophageal dysmotility with proximal escape waves and contrast stasis in the upper esophagus. Retrograde flow of contrast from the lower esophagus up to the level of the aortic notch.   4. Barium tablet became stuck briefly just inferior to the aortic notch and again at the gastroesophageal junction, suggestive of possible esophageal strictures at these locations.    Assessment/Plan:  Encounter Diagnoses  Name Primary?   Esophageal stricture    Dysphagia, unspecified type Yes   Glottic insufficiency    Age-related vocal fold atrophy    Post-nasal drip    Chronic cough    Environmental and seasonal allergies    Chronic GERD    Dysphonia    Chronic nasal congestion      Assessment and Plan Chronic hoarseness and voice changes (raspy voice) present for a long time several years. Hx of GERD and extensive GI hx summarized below:  She has hx of esophageal spasm, IBS, esophageal rings, esophagitis on EGD 12/2021. Per GI office notes she also has chronic pancreatitis and pancreatic insufficiency. On Aciphex currently (Rabeprazole)  Also established with Pulm, Dr Sherene Sires who diagnosed her with upper airway cough syndrome. Has pulmonary nodule and granulomatous disease on chest imaging. She has chronic cough. She has post-nasal drainage.   Exam including flexible laryngoscopy with VF atrophy, laryngeal tremor, she has head tremor hx as well. She also had GERD LPR changes on exam. There was mild pooling of secretions noted. She had evidence of scant purulent crusting along posterior nasopharynx but no pus or polyps in nasal passages.  She had evidence of post-nasal drainage.     -Consider voice therapy in the future. We discussed it but she would like to hold off at this time - MBS esophagram - medical management of GERD LPR and post-nasal drainage    Chronic Nasal Congestion: Persistent despite use of saline and Flonase. Noted thick mucus along posterior nasopharyngeal wall -Nasonex 2 puffs b/l nares in am and Ipratropium Bromide 2 puffs b/l nares in pm -Initiate saline nasal rinses once or twice daily. -Consider sinus CT scan in the future to evaluate for chronic sinus disease if sx will not improve - start weaning off Mucinex, we discussed the dose is too high  Chronic dysphagia  History of choking with eating, esophageal spasms, extensive GI hx of distal esophageal rings, esophagitis, on medication for GERD and esophageal spasm. Hx of hiatal hernia -Order swallow study to evaluate for aspiration and other swallowing disorders - MBS esophagram - continue Asiphex and Levsin per GI recommendations  Follow-up after swallow study to discuss results and potential need for voice therapy.     Update 05/08/2023 Assessment and Plan    Chronic Nasal congestion and drainage Chronic nasal congestion and drainage with mucus. Symptoms improved with current nasal spray regimen.  - Continue current nasal sprays (Flonase in am and Atrovent in pm)  - Refilled nasal spray prescriptions - Advise on proper nasal saline rinsing technique   Esophageal dysphagia and esophageal stricture  Esophageal dysphagia with recent esophageal dilation. Barium swallow test showed pill retention and esophageal narrowing, which prompted GI visit and EGD/balloon dilation.   She did not have MBS as planned, and we discussed that if she is able to we can consider getting FEES to rule out aspiration. There was some penetration noted on her esophagram.  - she would like to think about it and will schedule FEES in 2-3 mo - placed an order for  FEES  Chronic Dysphonia   Evidence of VF atrophy glottic insufficiency on scope exam during initial evaluation. We discussed voice therapy, injection augmentation today - consider voice therapy and injection augmentation based on results of FEES  RTC 6 mo Schedule FEES        Ashok Croon, MD Otolaryngology [facility] Phone: [phone] Fax: [phone]    05/08/2023, 7:07 PM

## 2023-05-13 ENCOUNTER — Encounter: Payer: Self-pay | Admitting: Cardiology

## 2023-05-13 ENCOUNTER — Ambulatory Visit: Payer: Medicare Other | Attending: Cardiology | Admitting: Cardiology

## 2023-05-13 VITALS — BP 126/62 | HR 84 | Ht 64.0 in | Wt 142.0 lb

## 2023-05-13 DIAGNOSIS — I471 Supraventricular tachycardia, unspecified: Secondary | ICD-10-CM | POA: Diagnosis not present

## 2023-05-13 DIAGNOSIS — I10 Essential (primary) hypertension: Secondary | ICD-10-CM

## 2023-05-13 DIAGNOSIS — E782 Mixed hyperlipidemia: Secondary | ICD-10-CM

## 2023-05-13 NOTE — Patient Instructions (Signed)
 Medication Instructions:  Your physician recommends that you continue on your current medications as directed. Please refer to the Current Medication list given to you today.   Labwork: None today  Testing/Procedures: None today  Follow-Up: 1 year  Any Other Special Instructions Will Be Listed Below (If Applicable).  If you need a refill on your cardiac medications before your next appointment, please call your pharmacy.

## 2023-05-13 NOTE — Progress Notes (Signed)
    Cardiology Office Note  Date: 05/13/2023   ID: Deborah Gray, DOB 1933-05-08, MRN [id number]  History of Present Illness: Deborah Gray is an 89 y.o. female last seen in February 2024.  She is here for a routine visit.  Reports no interval palpitations or chest discomfort.  I did review the chart, she has had a number of other health concerns within the last year.  We went over her medications.  Blood pressure is well-controlled today, she remains on Cozaar 50 mg daily.  I reviewed her interval lab work which is noted below.  I reviewed her recent ECG from early March showing sinus rhythm with prolonged PR interval and poor R wave progression.  Physical Exam: VS:  BP 126/62   Pulse 84   Ht 5\' 4"  (1.626 m)   Wt 142 lb (64.4 kg)   SpO2 96%   BMI 24.37 kg/m , BMI Body mass index is 24.37 kg/m.  Wt Readings from Last 3 Encounters:  05/13/23 142 lb (64.4 kg)  04/21/23 142 lb 6.4 oz (64.6 kg)  04/04/23 142 lb 6.4 oz (64.6 kg)    General: Patient appears comfortable at rest. HEENT: Conjunctiva and lids normal. Neck: Supple, no elevated JVP or carotid bruits. Lungs: Clear to auscultation, nonlabored breathing at rest. Cardiac: Regular rate and rhythm, no S3, 1/6 systolic murmur.  ECG:  An ECG dated 04/12/2022 was personally reviewed today and demonstrated:  Sinus rhythm with prolonged PR interval and poor R wave progression.  Labwork: 12/30/2022: ALT 13; AST 15; BUN 24; Creatinine, Ser 0.89; Hemoglobin 14.5; Platelets 293; Potassium 5.2; Sodium 144     Component Value Date/Time   CHOL 184 12/30/2022 1029   TRIG 85 12/30/2022 1029   HDL 71 12/30/2022 1029   CHOLHDL 2.6 12/30/2022 1029   CHOLHDL 3.6 11/22/2013 0852   VLDL 29 11/22/2013 0852   LDLCALC 98 12/30/2022 1029   Other Studies Reviewed Today:  No interval cardiac testing for review today.  Assessment and Plan:  1.  PSVT by history, she remains clinically stable without significant palpitations and not  on AV nodal blockers.  I reviewed her recent ECG.  Continue observation.   2.  Mixed hyperlipidemia, LDL 98 and HDL 71 in November 2024.  Continue Zetia 10 mg daily and Lovaza 1 g twice daily.   3.  Primary hypertension.  Continue Cozaar 50 mg daily.  Disposition:  Follow up  1 year.  Signed, Jonelle Sidle, M.D., F.A.C.C. [facility]

## 2023-05-23 ENCOUNTER — Ambulatory Visit: Admitting: Urology

## 2023-06-02 ENCOUNTER — Ambulatory Visit: Payer: Medicare Other | Admitting: Urology

## 2023-06-02 ENCOUNTER — Ambulatory Visit

## 2023-06-03 ENCOUNTER — Encounter: Payer: Self-pay | Admitting: Urology

## 2023-06-05 ENCOUNTER — Telehealth: Payer: Self-pay

## 2023-06-05 ENCOUNTER — Ambulatory Visit (INDEPENDENT_AMBULATORY_CARE_PROVIDER_SITE_OTHER)

## 2023-06-05 DIAGNOSIS — N3010 Interstitial cystitis (chronic) without hematuria: Secondary | ICD-10-CM | POA: Diagnosis not present

## 2023-06-05 MED ORDER — SODIUM BICARBONATE 8.4 % IV SOLN
50.0000 meq | Freq: Once | INTRAVENOUS | Status: DC
Start: 2023-06-05 — End: 2023-06-05

## 2023-06-05 MED ORDER — SODIUM BICARBONATE 8.4 % IV SOLN
50.0000 meq | Freq: Once | INTRAVENOUS | Status: AC
Start: 2023-06-05 — End: 2023-06-05
  Administered 2023-06-05: 50 meq

## 2023-06-05 MED ORDER — DIMETHYL SULFOXIDE 50 % IS SOLN
50.0000 mL | Freq: Once | INTRAVESICAL | Status: AC
Start: 2023-06-05 — End: 2023-06-05
  Administered 2023-06-05: 50 mL via URETHRAL

## 2023-06-05 MED ORDER — HYDROCORTISONE SOD SUC (PF) 100 MG IJ SOLR
100.0000 mg | Freq: Once | INTRAMUSCULAR | Status: AC
  Administered 2023-06-05: 100 mg

## 2023-06-05 MED ORDER — LIDOCAINE HCL 1 % IJ SOLN
20.0000 mL | Freq: Once | INTRAMUSCULAR | Status: AC
  Administered 2023-06-05: 20 mL

## 2023-06-05 NOTE — Telephone Encounter (Signed)
 Patient has complaints of urinary retention for her DMSO treatment she was unable to void, after placing catheter 280 was drained.  She also thinks she may have passed a stone.  Do you want imaging prior to appt?

## 2023-06-05 NOTE — Progress Notes (Signed)
 Order received to complete in and out catherization for urine sample. Patient was cleaned and prepped in a sterle fashion with Betadinex3  A 20 fr catheter foley was inserted. Urine return was note 280 ml. Urine Clear yellow in color. Catheter was then removed. Patient tolerated procedure with no complications were noted.  Performed by Wyonia Hefty, CMA  Urine sent for routine urinalysis  Bladder Instillation DMSO  Due to IC patient is present today for a Bladder Instillation of DMSO treatment.  DMSO treatment was instilled into the bladder. The catheter was then removed. Patient tolerated well, no complications were noted pt was instructed to hold the instillation for 20 minutes and then will void it out. Pt voiced understanding.    Preformed by: Wyonia Hefty, CMA  Follow up/ Additional notes: Follow up as scheduled with MD

## 2023-06-06 ENCOUNTER — Ambulatory Visit: Payer: Medicare Other

## 2023-06-06 LAB — URINALYSIS, ROUTINE W REFLEX MICROSCOPIC
Bilirubin, UA: NEGATIVE
Glucose, UA: NEGATIVE
Ketones, UA: NEGATIVE
Leukocytes,UA: NEGATIVE
Nitrite, UA: NEGATIVE
Protein,UA: NEGATIVE
RBC, UA: NEGATIVE
Specific Gravity, UA: 1.01 (ref 1.005–1.030)
Urobilinogen, Ur: 0.2 mg/dL (ref 0.2–1.0)
pH, UA: 6 (ref 5.0–7.5)

## 2023-06-10 ENCOUNTER — Other Ambulatory Visit: Payer: Self-pay

## 2023-06-10 DIAGNOSIS — R109 Unspecified abdominal pain: Secondary | ICD-10-CM

## 2023-06-10 NOTE — Telephone Encounter (Signed)
 CT stone study ordered.  Patient notified via mychart.

## 2023-06-13 ENCOUNTER — Other Ambulatory Visit: Payer: Self-pay | Admitting: Cardiology

## 2023-06-14 ENCOUNTER — Other Ambulatory Visit: Payer: Self-pay | Admitting: Gastroenterology

## 2023-06-25 ENCOUNTER — Other Ambulatory Visit: Payer: Self-pay | Admitting: Gastroenterology

## 2023-06-25 DIAGNOSIS — K861 Other chronic pancreatitis: Secondary | ICD-10-CM

## 2023-06-25 NOTE — Progress Notes (Deleted)
 Deborah Gray  Deborah Gray, female    DOB: [birth year]    MRN: [id number]   Brief patient profile:  89 yowf never smoker  retired Geophysicist/field seismologist referred to pulmonary clinic in [place]  03/21/2022 by Dr Kathleen Papa  for incidental lung nodules on w/u for hematuria.   Remembers bad bronchitis age 89 had to miss school    History of Present Illness  03/21/2022  Pulmonary/ 1st office eval/ Deborah Gray / Sales executive Complaint  Patient presents with   Consult    Ct scan abnormal   Dyspnea:  [place]/ no cooking / limited by back but still doing [employer] / [employer]c parking depends on whether back bothering her  Cough: none  but always botherd by nasal congestion f/u by [employer]  Sleep: occ night sweats x 2 year / no wt loss or resp cc noct  SABA use: none  02: none  Rec You multiple nodules that the other with largest calcified typical of a benign process  To be sure, we will repeat your CT no contrast  in 3 months and see you back afterwords   - 03/21/2022  ESR 9 - 03/21/2022  Quant TB neg - 03/21/2022 cbc with diff   Eos 0.1    12/26/2022  f/u ov/[place] office/Deborah Gray re: MPNs  maint on max gerd rx    Chief Complaint  Patient presents with   Cough  Dyspnea:  [employer] pushing cart  Cough: sensation of globus daytime better p breakfast / [place] neg eval   Sleeping: elevated mattress on thick quilts s    resp cc  SABA use: none  02: none   Rec GERD diet reviewed, bed blocks rec  My office will be contacting you by phone for referral to [facility]  > seen by Soldativa 02/04/23  gerd  Exam including flexible laryngoscopy with VF atrophy, laryngeal tremor, she has head tremor hx as well. She also had GERD LPR changes on exam. There was mild pooling of secretions noted. She had evidence of scant purulent crusting along posterior nasopharynx but no pus or polyps in nasal passages. She had evidence of post-nasal drainage.     -Consider voice therapy in the future. We discussed it  but she would like to hold off at this time - MBS esophagram - medical management of GERD LPR and post-nasal drainage    Chronic Nasal Congestion: Persistent despite use of saline and Flonase . Noted thick mucus along posterior nasopharyngeal wall -Nasonex  2 puffs b/l nares in am and Ipratropium Bromide  2 puffs b/l nares in pm -Initiate saline nasal rinses once or twice daily. -Consider sinus CT scan in the future to evaluate for chronic sinus disease if sx will not improve - start weaning off Mucinex, we discussed the dose is too high   Chronic dysphagia  History of choking with eating, esophageal spasms, extensive GI hx of distal esophageal rings, esophagitis, on medication for GERD and esophageal spasm. Hx of hiatal hernia -Order swallow study to evaluate for aspiration and other swallowing disorders - MBS esophagram - continue Asiphex and Levsin per GI recommendations  03/05/23 Single contrast esophagram significant for: 1.  Intermittent vestibular penetration without aspiration. 2.  Patulous proximal esophagus. 3. Severe lower esophageal dysmotility with proximal escape waves and contrast stasis in the upper esophagus. Retrograde flow of contrast from the lower esophagus up to the level of the aortic notch. 4. Barium tablet became stuck briefly just inferior to the aortic notch  and again at the gastroesophageal junction, suggestive of possible esophageal strictures at these locations    06/26/2023  f/u ov/Deborah Gray office/Deborah Gray re: uacs/ gerd  maint on ***  No chief complaint on file.   Dyspnea:  *** Cough: *** Sleeping: ***   resp cc  SABA use: *** 02: ***  Lung cancer screening: ***   No obvious day to day or daytime variability or assoc excess/ purulent sputum or mucus plugs or hemoptysis or cp or chest tightness, subjective wheeze or overt sinus or hb symptoms.    Also denies any obvious fluctuation of symptoms with weather or environmental changes or other aggravating  or alleviating factors except as outlined above   No unusual exposure hx or h/o childhood pna/ asthma or knowledge of premature birth.  Current Allergies, Complete Past Medical History, Past Surgical History, Family History, and Social History were reviewed in [employer] record.  ROS  The following are not active complaints unless bolded Hoarseness, sore throat, dysphagia, dental problems, itching, sneezing,  nasal congestion or discharge of excess mucus or purulent secretions, ear ache,   fever, chills, sweats, unintended wt loss or wt gain, classically pleuritic or exertional cp,  orthopnea pnd or arm/hand swelling  or leg swelling, presyncope, palpitations, abdominal pain, anorexia, nausea, vomiting, diarrhea  or change in bowel habits or change in bladder habits, change in stools or change in urine, dysuria, hematuria,  rash, arthralgias, visual complaints, headache, numbness, weakness or ataxia or problems with walking or coordination,  change in mood or  memory.        No outpatient medications have been marked as taking for the 06/26/23 encounter (Appointment) with Deborah Formica, MD.   Current Facility-Administered Medications for the 06/26/23 encounter (Appointment) with Deborah Formica, MD  Medication   lidocaine  HCl (PF) (XYLOCAINE ) 2 % injection 10 mL               Past Medical History:  Diagnosis Date   Arthritis    Benign neoplasm of colon    Constipation    Diaphragmatic hernia without mention of obstruction or gangrene    Diverticulosis of colon (without mention of hemorrhage)    Dysphagia, pharyngoesophageal phase    Esophageal reflux    Essential hypertension    Heart murmur    Hemorrhoids    Hyperlipidemia    Internal hemorrhoids without mention of complication    Left wrist fracture    PONV (postoperative nausea and vomiting)    Stricture and stenosis of esophagus    Type 2 diabetes mellitus (HCC)    Unspecified gastritis and  gastroduodenitis without mention of hemorrhage        Objective:    wts   06/26/2023           ***  12/26/2022        141   06/25/22 141 lb 9.6 oz (64.2 kg)  06/04/22 137 lb 12.8 oz (62.5 kg)  05/31/22 139 lb (63 kg)    Vital signs reviewed  06/26/2023  - Note at rest 02 sats  ***% on ***   General appearance:    ***    Severely kyphotic ***        Assessment

## 2023-06-26 ENCOUNTER — Ambulatory Visit: Admitting: Internal Medicine

## 2023-06-26 ENCOUNTER — Encounter: Payer: Self-pay | Admitting: Internal Medicine

## 2023-07-01 ENCOUNTER — Ambulatory Visit (HOSPITAL_COMMUNITY)
Admission: RE | Admit: 2023-07-01 | Discharge: 2023-07-01 | Disposition: A | Discharge status: Home / Self Care | Source: Ambulatory Visit | Attending: Urology | Admitting: Urology

## 2023-07-01 DIAGNOSIS — R109 Unspecified abdominal pain: Secondary | ICD-10-CM | POA: Insufficient documentation

## 2023-07-02 ENCOUNTER — Ambulatory Visit: Payer: Self-pay

## 2023-07-05 ENCOUNTER — Ambulatory Visit
Admission: EM | Admit: 2023-07-05 | Discharge: 2023-07-05 | Disposition: A | Discharge status: Home / Self Care | Attending: Family Medicine | Admitting: Family Medicine

## 2023-07-05 ENCOUNTER — Other Ambulatory Visit: Payer: Self-pay

## 2023-07-05 ENCOUNTER — Encounter: Payer: Self-pay | Admitting: Emergency Medicine

## 2023-07-05 DIAGNOSIS — Z87440 Personal history of urinary (tract) infections: Secondary | ICD-10-CM | POA: Diagnosis not present

## 2023-07-05 DIAGNOSIS — R300 Dysuria: Secondary | ICD-10-CM | POA: Diagnosis not present

## 2023-07-05 DIAGNOSIS — U071 COVID-19: Secondary | ICD-10-CM | POA: Diagnosis not present

## 2023-07-05 LAB — POCT URINALYSIS DIP (MANUAL ENTRY)
Bilirubin, UA: NEGATIVE
Blood, UA: NEGATIVE
Glucose, UA: NEGATIVE mg/dL
Ketones, POC UA: NEGATIVE mg/dL
Nitrite, UA: NEGATIVE
Spec Grav, UA: 1.015 (ref 1.010–1.025)
Urobilinogen, UA: 0.2 U/dL
pH, UA: 6 (ref 5.0–8.0)

## 2023-07-05 LAB — POC COVID19/FLU A&B COMBO
Covid Antigen, POC: POSITIVE — AB
Influenza A Antigen, POC: NEGATIVE
Influenza B Antigen, POC: NEGATIVE

## 2023-07-05 MED ORDER — MOLNUPIRAVIR EUA 200MG CAPSULE: 4.0000 | ORAL_CAPSULE | Freq: Two times a day (BID) | ORAL | 0 refills | Status: AC

## 2023-07-05 MED ORDER — BENZONATATE 200 MG PO CAPS: 200.0000 mg | ORAL_CAPSULE | Freq: Three times a day (TID) | ORAL | 0 refills | Status: DC | PRN

## 2023-07-05 NOTE — Discharge Instructions (Addendum)
 I have sent in an antiviral medication for COVID as well as a cough medication.  Continue your nasal sprays, and you may take Coricidin HBP, plain Mucinex, nasal saline, humidifiers, Tylenol .  I have sent your urine out for culture to see if you have a UTI.  Stay well-hydrated, follow-up for worsening symptoms.

## 2023-07-05 NOTE — ED Triage Notes (Addendum)
 Pt reports was exposed to covid at nursing residence. Reports cough,nasal congestion,body aches, chills, low grade temp since yesterday.   Also reports urinary incontinence,urinary frequency since last night. States hx of uti.

## 2023-07-05 NOTE — ED Provider Notes (Signed)
 [facility]    CSN: [id number] Arrival date & time: 07/05/23  1312      History   Chief Complaint Chief Complaint  Patient presents with   Nasal Congestion    HPI Deborah Gray is a 89 y.o. female.   Patient presenting today with 1 day history of cough, congestion, body aches, chills, low-grade fever.  States she is also having some urinary incontinence and frequency since last night.  She notes she was exposed to COVID recently at her nursing residence.  Denies chest pain, shortness of breath, abdominal pain, vomiting, diarrhea.  So far taking prescribed nasal sprays and Mucinex with minimal relief.  No known history of chronic pulmonary disease.  She does have a history of interstitial cystitis and recurrent UTIs followed by urology.  She is also been dealing with kidney stones frequently recently.    Past Medical History:  Diagnosis Date   Arthritis    Benign neoplasm of colon    Constipation    Diaphragmatic hernia without mention of obstruction or gangrene    Diverticulosis of colon (without mention of hemorrhage)    Dysphagia, pharyngoesophageal phase    Esophageal reflux    Essential hypertension    Gastritis    Heart murmur    Hemorrhoids    Hyperlipidemia    Internal hemorrhoids without mention of complication    Interstitial cystitis    Left wrist fracture    PONV (postoperative nausea and vomiting)    PSVT (paroxysmal supraventricular tachycardia) (HCC)    Stricture and stenosis of esophagus    Type 2 diabetes mellitus (HCC)     Patient Active Problem List   Diagnosis Date Noted   Upper airway cough syndrome 12/26/2022   Interstitial cystitis 10/24/2022   History of recurrent UTIs 10/24/2022   Bilateral renal cysts 10/24/2022   Osteoporosis 07/05/2022   Multiple pulmonary nodules determined by computed tomography of lung 03/21/2022   Nephrolithiasis 03/15/2021   Prolapsed internal hemorrhoids, grade 3 01/23/2021   Spinal stenosis of  lumbar region 12/02/2017   Osteoarthritis of left knee 12/02/2017   Degeneration of lumbar intervertebral disc 11/18/2017   IBS (irritable bowel syndrome) 01/30/2015   Type 2 diabetes mellitus (HCC) 12/22/2014   Hyperlipidemia, unspecified 12/22/2014   Essential hypertension 11/25/2013   H/O esophageal spasm 01/27/2012   Constipation 01/27/2012   Chronic pancreatitis (HCC) 02/26/2011   Gastroesophageal reflux disease 01/18/2011    Past Surgical History:  Procedure Laterality Date   ABDOMINAL HYSTERECTOMY     BACTERIAL OVERGROWTH TEST N/A 09/06/2015   Procedure: BACTERIAL OVERGROWTH TEST;  Surgeon: Suzette Espy, MD;  Location: [facility];  Service: Endoscopy;  Laterality: N/A;  800   BIOPSY  01/02/2022   Procedure: BIOPSY;  Surgeon: Suzette Espy, MD;  Location: [facility];  Service: Endoscopy;;   BIOPSY  11/18/2022   Procedure: BIOPSY;  Surgeon: Suzette Espy, MD;  Location: [facility];  Service: Endoscopy;;   BREAST LUMPECTOMY Right    benign   CATARACT EXTRACTION Bilateral 2006   Implants in both, surgeries done 6 weeks apart   CHOLECYSTECTOMY     COLONOSCOPY  03/08/02   Adan Holms: diverticulosis, internal hemorrhoids, adenomatous colon polyp   COLONOSCOPY  01/23/04   Adan Holms: diverticulosis, internal hemorrhoids   COLONOSCOPY  09/25/05   Adan Holms: diverticulosis   COLONOSCOPY  07/05/09   Adan Holms: severe diverticulosis   COLONOSCOPY  01/21/11   Adan Holms: severe diverticulosis in sigmoid to desc colon, int hemorrhoids, follow  up TCS in 5 years   COLONOSCOPY N/A 04/10/2016   [name]: diverticulosis   CYSTOSCOPY W/ URETERAL STENT PLACEMENT Right 03/15/2021   Procedure: CYSTOSCOPY WITH RETROGRADE PYELOGRAM/URETERAL STENT PLACEMENT;  Surgeon: Lahoma Pigg, MD;  Location: [facility];  Service: Urology;  Laterality: Right;   CYSTOSCOPY WITH RETROGRADE PYELOGRAM, URETEROSCOPY AND STENT PLACEMENT Right 03/29/2021   Procedure: CYSTOSCOPY WITH RETROGRADE PYELOGRAM, URETEROSCOPY  AND STENT EXCHANGE;  Surgeon: Marco Severs, MD;  Location: [facility];  Service: Urology;  Laterality: Right;   DILATION AND CURETTAGE OF UTERUS     ESOPHAGEAL MANOMETRY  03/21/08   Patterson: findings c/w [place] Esophagus   ESOPHAGOGASTRODUODENOSCOPY  03/08/02   Adan Holms: esophageal stricture, chronic gerd, s/p dilation.    ESOPHAGOGASTRODUODENOSCOPY  01/23/04   Adan Holms: esophageal stricture, gastritis, hiatal hernia   ESOPHAGOGASTRODUODENOSCOPY  09/25/05   Adan Holms: gastritis, benign bx, no H.pylori   ESOPHAGOGASTRODUODENOSCOPY  07/05/09   Adan Holms: gastropathy, benign small bowel bx and gastric bx   ESOPHAGOGASTRODUODENOSCOPY N/A 05/15/2015   Dr. Riley Cheadle- diffuse moderate inflammation characterized by congestion (edema), erythema, and linear erosions was found in the entire examined stomach. bx= reactive gastropathy   ESOPHAGOGASTRODUODENOSCOPY (EGD) WITH PROPOFOL  N/A 01/02/2022   Procedure: ESOPHAGOGASTRODUODENOSCOPY (EGD) WITH PROPOFOL ;  Surgeon: Suzette Espy, MD;  [facility]: [facility];  Service: Endoscopy;  Laterality: N/A;  10:30am, asa 2   ESOPHAGOGASTRODUODENOSCOPY (EGD) WITH PROPOFOL  N/A 04/23/2023   Procedure: ESOPHAGOGASTRODUODENOSCOPY (EGD) WITH PROPOFOL ;  Surgeon: Suzette Espy, MD;  [facility]: [facility];  Service: Endoscopy;  Laterality: N/A;  1130AM, ASA 3   FLEXIBLE SIGMOIDOSCOPY N/A 11/18/2022   Procedure: FLEXIBLE SIGMOIDOSCOPY;  Surgeon: Suzette Espy, MD;  [facility]: [facility];  Service: Endoscopy;  Laterality: N/A;  10:00 am, asa 2   FOOT SURGERY Left    HEMORRHOID BANDING  2017   Dr.[name]   HOLMIUM LASER APPLICATION Right 03/29/2021   Procedure: HOLMIUM LASER APPLICATION;  Surgeon: Marco Severs, MD;  Location: [facility];  Service: Urology;  Laterality: Right;   LAPAROSCOPIC VAGINAL HYSTERECTOMY     MALONEY DILATION N/A 01/02/2022   Procedure: Londa Rival DILATION;  Surgeon: Suzette Espy, MD;  [facility]: [facility];  Service: Endoscopy;  Laterality:  N/A;   MALONEY DILATION N/A 04/23/2023   Procedure: DILATION, ESOPHAGUS, USING MALONEY DILATOR;  Surgeon: Suzette Espy, MD;  [facility]: [facility];  Service: Endoscopy;  Laterality: N/A;   ORIF WRIST FRACTURE Left 06/23/2018   Procedure: OPEN REDUCTION INTERNAL FIXATION (ORIF) LEFT WRIST FRACTURE;  Surgeon: Saundra Curl, MD;  Location: [facility];  Service: Orthopedics;  Laterality: Left;  regional arm block   RECTOCELE REPAIR     TOTAL KNEE ARTHROPLASTY Right     OB History     Gravida  4   Para  3   Term  3   Preterm      AB  1   Living  3      SAB  1   IAB      Ectopic      Multiple      Live Births               Home Medications    Prior to Admission medications   Medication Sig Start Date End Date Taking? Authorizing Provider  benzonatate  (TESSALON ) 200 MG capsule Take 1 capsule (200 mg total) by mouth 3 (three) times daily as needed for cough. 07/05/23  Yes Corbin Dess, PA-C  molnupiravir EUA (  LAGEVRIO) 200 mg CAPS capsule Take 4 capsules (800 mg total) by mouth 2 (two) times daily for 5 days. 07/05/23 07/10/23 Yes Corbin Dess, PA-C  acetaminophen  (TYLENOL ) 500 MG tablet Take 500 mg by mouth at bedtime.    [provider]  ascorbic acid (VITAMIN C) 500 MG tablet Take 500 mg by mouth daily.    [provider]  aspirin  EC 81 MG tablet Take 81 mg by mouth at bedtime.    [provider]  Carboxymethylcellulose Sodium (THERATEARS) 0.25 % SOLN Place 1 drop into both eyes 2 (two) times daily.    [provider]  cholecalciferol (VITAMIN D3) 25 MCG (1000 UNIT) tablet Take 1,000 Units by mouth at bedtime.    [provider]  clidinium-chlordiazePOXIDE  (LIBRAX) 5-2.5 MG capsule Take 1 capsule by mouth 3 (three) times daily as needed (for esophageal symptoms). 10/22/22   Lanney Pitts, PA-C  ezetimibe  (ZETIA ) 10 MG tablet TAKE 1 TABLET BY MOUTH EVERY DAY 06/13/23   Gerard Knight,  MD  famotidine  (PEPCID ) 20 MG tablet TAKE 1 TABLET BY MOUTH DAILY WITH SUPPER. 06/16/23   Lanney Pitts, PA-C  Guaifenesin 1200 MG TB12 Take 1,200 mg by mouth daily with supper.    [provider]  hyoscyamine  (LEVSIN SL) 0.125 MG SL tablet USE 1 TABLET UNDER THE TONGUE BEFORE MEALS AND AT BEDTIME AS NEEDED FOR FLARES IN SYMPTOMS, DIARRHEA/ABDOMINAL CRAMPING 07/17/22   Delman Ferns, NP  ipratropium (ATROVENT ) 0.03 % nasal spray Place 2 sprays into both nostrils every 12 (twelve) hours. 05/08/23   Soldatova, Liuba, MD  losartan  (COZAAR ) 50 MG tablet TAKE 1 TABLET BY MOUTH EVERY DAY 09/27/22   Gerard Knight, MD  meclizine  (ANTIVERT ) 25 MG tablet Take 1 tablet (25 mg total) by mouth 3 (three) times daily as needed. 01/27/23   Cook, Jayce G, DO  metFORMIN  (GLUCOPHAGE ) 500 MG tablet TAKE ONE TABLET BY MOUTH EVERY MORNING ONE AT LUNCH AND 2 AT BEDTIME 03/21/23   Cook, Jayce G, DO  methenamine  (HIPREX ) 1 g tablet TAKE 1 TABLET BY MOUTH TWICE A DAY 09/17/22   McKenzie, Arden Beck, MD  mirabegron  ER (MYRBETRIQ ) 50 MG TB24 tablet Take 1 tablet (50 mg total) by mouth daily. 10/24/22   Larocco, Sarah C, FNP  mometasone  (NASONEX ) 50 MCG/ACT nasal spray Place 2 sprays into the nose daily. 05/08/23   Soldatova, Liuba, MD  Multiple Vitamins-Minerals (PRESERVISION AREDS 2) CAPS Take 1 capsule by mouth 2 (two) times daily.     [provider]  nateglinide  (STARLIX ) 60 MG tablet Take 1 tablet (60 mg total) by mouth 3 (three) times daily with meals. 01/06/23   Cook, Jayce G, DO  omega-3 acid ethyl esters (LOVAZA) 1 g capsule Take 2 g by mouth every morning.    [provider]  Pancrelipase , Lip-Prot-Amyl, (CREON ) 24000-76000 units CPEP TAKE 3 CAPSULES THREE TIMES DAILY WITH MEALS AND ONE CAPSULE WITH SNACK TWICE DAILY 06/25/23   Lanney Pitts, PA-C  RABEprazole  (ACIPHEX ) 20 MG tablet TAKE 1 TABLET BY MOUTH EVERY DAY 12/16/22   Lanney Pitts, PA-C    Family History Family History  Problem  Relation Age of Onset   Ovarian cancer Mother    Diabetes Father    Heart disease Father    Breast cancer Sister    Liver cancer Sister    Colon cancer Cousin        Paternal side   Diabetes Maternal Aunt  Liver disease Cousin        Maternal side, never drank    Social History Social History   Tobacco Use   Smoking status: Never   Smokeless tobacco: Never   Tobacco comments:    Never smoked  Vaping Use   Vaping status: Never Used  Substance Use Topics   Alcohol use: No    Alcohol/week: 0.0 standard drinks of alcohol   Drug use: No     Allergies   Adhesive [tape], Estrogens, Other, and Sulfonamide derivatives   Review of Systems Review of Systems Per HPI  Physical Exam Triage Vital Signs ED Triage Vitals  Encounter Vitals Group     BP 07/05/23 1351 119/70     Systolic BP Percentile --      Diastolic BP Percentile --      Pulse Rate 07/05/23 1351 100     Resp 07/05/23 1351 20     Temp 07/05/23 1351 99.2 F (37.3 C)     Temp Source 07/05/23 1351 Oral     SpO2 07/05/23 1351 95 %     Weight --      Height --      Head Circumference --      Peak Flow --      Pain Score 07/05/23 1348 2     Pain Loc --      Pain Education --      Exclude from Growth Chart --    No data found.  Updated Vital Signs BP 119/70 (BP Location: Right Arm)   Pulse 100   Temp 99.2 F (37.3 C) (Oral)   Resp 20   SpO2 95%   Visual Acuity Right Eye Distance:   Left Eye Distance:   Bilateral Distance:    Right Eye Near:   Left Eye Near:    Bilateral Near:     Physical Exam Vitals and nursing note reviewed.  Constitutional:      Appearance: Normal appearance.  HENT:     Head: Atraumatic.     Right Ear: Tympanic membrane and external ear normal.     Left Ear: Tympanic membrane and external ear normal.     Nose: Rhinorrhea present.     Mouth/Throat:     Mouth: Mucous membranes are moist.     Pharynx: Posterior oropharyngeal erythema present.  Eyes:     Extraocular  Movements: Extraocular movements intact.     Conjunctiva/sclera: Conjunctivae normal.  Cardiovascular:     Rate and Rhythm: Normal rate and regular rhythm.     Heart sounds: Normal heart sounds.  Pulmonary:     Effort: Pulmonary effort is normal.     Breath sounds: Normal breath sounds. No wheezing or rales.  Abdominal:     General: Bowel sounds are normal. There is no distension.     Palpations: Abdomen is soft.     Tenderness: There is no abdominal tenderness. There is no right CVA tenderness, left CVA tenderness or guarding.  Musculoskeletal:        General: Normal range of motion.     Cervical back: Normal range of motion and neck supple.  Skin:    General: Skin is warm and dry.  Neurological:     Mental Status: She is alert and oriented to person, place, and time.  Psychiatric:        Mood and Affect: Mood normal.        Thought Content: Thought content normal.      UC Treatments /  Results  Labs (all labs ordered are listed, but only abnormal results are displayed) Labs Reviewed  POCT URINALYSIS DIP (MANUAL ENTRY) - Abnormal; Notable for the following components:      Result Value   Protein Ur, POC trace (*)    Leukocytes, UA Trace (*)    All other components within normal limits  POC COVID19/FLU A&B COMBO - Abnormal; Notable for the following components:   Covid Antigen, POC Positive (*)    All other components within normal limits  URINE CULTURE    EKG   Radiology No results found.  Procedures Procedures (including critical care time)  Medications Ordered in UC Medications - No data to display  Initial Impression / Assessment and Plan / UC Course  I have reviewed the triage vital signs and the nursing notes.  Pertinent labs & imaging results that were available during my care of the patient were reviewed by me and considered in my medical decision making (see chart for details).     Vitals and exam overall reassuring today with no red flag findings.   Rapid COVID-positive.  Treat with molnupiravir, Tessalon , supportive over-the-counter medications and home care.  Urinalysis with trace leuks, urine culture pending to rule out true urinary tract infection.  Push fluids, follow-up with urology as scheduled.  Final Clinical Impressions(s) / UC Diagnoses   Final diagnoses:  History of recurrent UTIs  Dysuria  COVID-19     Discharge Instructions      I have sent in an antiviral medication for COVID as well as a cough medication.  Continue your nasal sprays, and you may take Coricidin HBP, plain Mucinex, nasal saline, humidifiers, Tylenol .  I have sent your urine out for culture to see if you have a UTI.  Stay well-hydrated, follow-up for worsening symptoms.  ED Prescriptions     Medication Sig Dispense Auth. Provider   molnupiravir EUA (LAGEVRIO) 200 mg CAPS capsule Take 4 capsules (800 mg total) by mouth 2 (two) times daily for 5 days. 40 capsule Corbin Dess, PA-C   benzonatate  (TESSALON ) 200 MG capsule Take 1 capsule (200 mg total) by mouth 3 (three) times daily as needed for cough. 20 capsule Corbin Dess, [place]      PDMP not reviewed this encounter.   Corbin Dess, [place] 07/05/23 1440

## 2023-07-07 ENCOUNTER — Ambulatory Visit (HOSPITAL_COMMUNITY): Payer: Self-pay

## 2023-07-07 ENCOUNTER — Ambulatory Visit

## 2023-07-07 ENCOUNTER — Telehealth: Payer: Self-pay | Admitting: Urology

## 2023-07-07 LAB — URINE CULTURE: Culture: 90000 — AB

## 2023-07-07 MED ORDER — CEPHALEXIN 500 MG PO CAPS: 500.0000 mg | ORAL_CAPSULE | Freq: Two times a day (BID) | ORAL | 0 refills | Status: DC

## 2023-07-07 NOTE — Telephone Encounter (Signed)
 Positive urine culture, Keflex  sent

## 2023-07-07 NOTE — Telephone Encounter (Signed)
 Returned call to patient. Advised patient to try a barrier cream such as desitin to help with irritation due to incontinence episodes.  Patient will keep scheduled ov with MD on next week and f/u with DMSO tx per his advice.

## 2023-07-07 NOTE — Telephone Encounter (Signed)
 Left message she has covid and cannot make appt today. She wants a call because she is having bladder problems and is red in her vaginal area and down her leg.

## 2023-07-09 ENCOUNTER — Ambulatory Visit: Payer: Medicare Other | Admitting: Urology

## 2023-07-11 ENCOUNTER — Ambulatory Visit: Payer: Self-pay | Admitting: *Deleted

## 2023-07-11 NOTE — Telephone Encounter (Signed)
  Chief Complaint: dizziness, nasal congestion requesting advise from PCP  Symptoms: s/p covid taking keflex  3 days left to complete course. Continued nasal congestion right side nose getting blocked. Blowing nose thick "jelly like"  green mucus blood clots small. Dizziness inner ear issues and taking 1/2 dose of meclizine . Drinking plenty of fluids. Using nasal sprays. Frequency: approx 10 days  Pertinent Negatives: Patient denies fever no cough, no sinus pain no severe dizziness can walk. Disposition: [] ED /[] Urgent Care (no appt availability in office) / [] Appointment(In office/virtual)/ []  [place] [facility]/ [x] Home Care/ [] Refused Recommended Disposition /[] [facility]/ []  Follow-up with PCP Additional Notes:   Recommended to continue with nasal sprays and  antibiotics and hydration and if blood clots larger and issues with breathing issues occur,   go to UC. Please advise patient requesting recommendations from PCP. No available appt at this time.   Last UC visit Saturday 07/05/23   Reason for Disposition  [1] Sinus congestion (pressure, fullness) AND [2] present > 10 days  Answer Assessment - Initial Assessment Questions 1. LOCATION: "Where does it hurt?"      Stuffy nose one side, dizziness at times 2. ONSET: "When did the sinus pain start?"  (e.g., hours, days)      Blowing nose for thick "jelly" mucus with small blood clots  3. SEVERITY: "How bad is the pain?"   (Scale 1-10; mild, moderate or severe)   - MILD (1-3): doesn't interfere with normal activities    - MODERATE (4-7): interferes with normal activities (e.g., work or school) or awakens from sleep   - SEVERE (8-10): excruciating pain and patient unable to do any normal activities        No pain 4. RECURRENT SYMPTOM: "Have you ever had sinus problems before?" If Yes, ask: "When was the last time?" and "What happened that time?"      Yes  5. NASAL CONGESTION: "Is the nose blocked?" If Yes, ask: "Can you open  it or must you breathe through your mouth?"     Yes blowing nose for green thick mucus and bleeding with small  6. NASAL DISCHARGE: "Do you have discharge from your nose?" If so ask, "What color?"     Yes green and small amount of blood clots 7. FEVER: "Do you have a fever?" If Yes, ask: "What is it, how was it measured, and when did it start?"      na 8. OTHER SYMPTOMS: "Do you have any other symptoms?" (e.g., sore throat, cough, earache, difficulty breathing)     Dizziness , tinnitis, right side of nose blocked and blowing nose for blood thick green with small blood clots  9. PREGNANCY: "Is there any chance you are pregnant?" "When was your last menstrual period?"     na  Protocols used: Sinus Pain or Congestion-A-AH

## 2023-07-15 ENCOUNTER — Ambulatory Visit: Payer: Self-pay

## 2023-07-15 NOTE — Telephone Encounter (Signed)
 Cook, Jayce G, DO     I agree with recommendations given.

## 2023-07-15 NOTE — Telephone Encounter (Signed)
 Copied from CRM [id number]. Topic: Clinical - Red Word Triage >> Jul 15, 2023  2:01 PM [place] A wrote: [facility] that prompted transfer to Nurse Triage: Patient is having Vertigo and having dizziness. Patient had Covid last week and still has congestion in nostrils.   Chief Complaint: Nasal congestion, Post nasal drip,  Symptoms: post nasal drip, dizziness, right nasal congestion Frequency: x 10 days Pertinent Negatives: Patient denies chest pain, difficulty breathing, fever Disposition: [] ED /[] Urgent Care (no appt availability in office) / [x] Appointment(In office/virtual)/ []  [facility]/ [] Home Care/ [] Refused Recommended Disposition /[] [facility] [facility]/ []  Follow-up with PCP Additional Notes: Patient called and advised that she went through her quarantine period from having COVID.  She states that her sinuses are still so very congested. She states that she has a deviated septum and she tried using the nasal washes with no success. Patient states it is like "jelly" that she is blowing out of her nose. Patient has been using Mucinex and the nasal sprays. Patient states that her tinnitus/inner ear issues are not bothering her today but only in the past few days with certain quick movements. Patient diagnosed with COVID 19 on 5/17. She states that she finished the COVID medication.  Patient states that it is better than it was but she just isn't 100% better. Patient also states that she finished Keflex  that she was given for a UTI. She finished the Keflex  two days ago on the 24th. Patient states she didn't know if she was having some issues with seasonal allergies---she went outside today and her eyes started watering and she came right back inside.  Patient denies chest pain, difficulty breathing, fever.  Patient has an appointment tomorrow morning so she accepted an appointment on Thursday May 29th at 3:50 PM with her PCP Kathleen Papa, DO for evaluation of this.  Patient is  also advised that she can call us  back with any changes and if she decides she would like to be seen prior to this appointment---Urgent Cares are an alternative if needed. She verbalized understanding of this.  Care Advice is given as per protocol.  Caller is advised that if anything worsens to go to the Emergency Room. Caller verbalized understanding and agreed with plan.   Reason for Disposition  [1] Sinus congestion (pressure, fullness) AND [2] present > 10 days  Answer Assessment - Initial Assessment Questions 1. LOCATION: "Where does it hurt?"      Nasal area 2. ONSET: "When did the sinus pain start?"  (e.g., hours, days)      About ten days 3. SEVERITY: "How bad is the pain?"   (Scale 1-10; mild, moderate or severe)   - MILD (1-3): doesn't interfere with normal activities    - MODERATE (4-7): interferes with normal activities (e.g., work or school) or awakens from sleep   - SEVERE (8-10): excruciating pain and patient unable to do any normal activities        No pain---just irritated 4. RECURRENT SYMPTOM: "Have you ever had sinus problems before?" If Yes, ask: "When was the last time?" and "What happened that time?"      Patient diagnosed with COVID 5/17 5. NASAL CONGESTION: "Is the nose blocked?" If Yes, ask: "Can you open it or must you breathe through your mouth?"     Right side congested 6. NASAL DISCHARGE: "Do you have discharge from your nose?" If so ask, "What color?"     Right nasal congestion  7. FEVER: "Do you  have a fever?" If Yes, ask: "What is it, how was it measured, and when did it start?"      Patient denies 8. OTHER SYMPTOMS: "Do you have any other symptoms?" (e.g., sore throat, cough, earache, difficulty breathing)     Nasal drip,  Protocols used: Sinus Pain or Congestion-A-AH

## 2023-07-16 ENCOUNTER — Telehealth: Payer: Self-pay | Admitting: Urology

## 2023-07-16 ENCOUNTER — Encounter: Payer: Self-pay | Admitting: Urology

## 2023-07-16 ENCOUNTER — Ambulatory Visit (INDEPENDENT_AMBULATORY_CARE_PROVIDER_SITE_OTHER): Admitting: Urology

## 2023-07-16 VITALS — BP 152/71 | HR 98

## 2023-07-16 DIAGNOSIS — N3010 Interstitial cystitis (chronic) without hematuria: Secondary | ICD-10-CM | POA: Diagnosis not present

## 2023-07-16 DIAGNOSIS — N200 Calculus of kidney: Secondary | ICD-10-CM

## 2023-07-16 LAB — MICROSCOPIC EXAMINATION: WBC, UA: >30 /HPF — AB (ref 0–5)

## 2023-07-16 LAB — URINALYSIS, ROUTINE W REFLEX MICROSCOPIC
Bilirubin, UA: NEGATIVE
Glucose, UA: NEGATIVE
Ketones, UA: NEGATIVE
Nitrite, UA: NEGATIVE
Protein,UA: NEGATIVE
RBC, UA: NEGATIVE
Specific Gravity, UA: 1.015 (ref 1.005–1.030)
Urobilinogen, Ur: 0.2 mg/dL (ref 0.2–1.0)
pH, UA: 6 (ref 5.0–7.5)

## 2023-07-16 LAB — BLADDER SCAN AMB NON-IMAGING: Scan Result: 106

## 2023-07-16 MED ORDER — LIDOCAINE HCL 1 % IJ SOLN
20.0000 mL | Freq: Once | INTRAMUSCULAR | Status: AC
  Administered 2023-07-16: 20 mL

## 2023-07-16 MED ORDER — CIPROFLOXACIN HCL 500 MG PO TABS
500.0000 mg | ORAL_TABLET | Freq: Once | ORAL | Status: AC
  Administered 2023-07-16: 500 mg via ORAL

## 2023-07-16 MED ORDER — ALFUZOSIN HCL ER 10 MG PO TB24: 10.0000 mg | ORAL_TABLET | Freq: Every day | ORAL | 11 refills | Status: DC

## 2023-07-16 MED ORDER — MIRABEGRON ER 50 MG PO TB24: 50.0000 mg | ORAL_TABLET | Freq: Every day | ORAL | 3 refills | Status: DC

## 2023-07-16 MED ORDER — HYDROCORTISONE SOD SUC (PF) 100 MG IJ SOLR
100.0000 mg | Freq: Once | INTRAMUSCULAR | Status: AC
  Administered 2023-07-16: 100 mg

## 2023-07-16 MED ORDER — DIMETHYL SULFOXIDE 50 % IS SOLN
50.0000 mL | Freq: Once | INTRAVESICAL | Status: AC
  Administered 2023-07-16: 50 mL via URETHRAL

## 2023-07-16 MED ORDER — SODIUM BICARBONATE 8.4 % IV SOLN
50.0000 meq | Freq: Once | INTRAVENOUS | Status: AC
  Administered 2023-07-16: 50 meq

## 2023-07-16 NOTE — Telephone Encounter (Signed)
 Confused about 2 different medications. She is not sure if she needs both or one

## 2023-07-16 NOTE — Progress Notes (Signed)
 07/16/2023 10:13 AM   Deborah Gray Neither December 02, 1933 [id number]  Referring provider: Cook, Jayce G, DO [address] [place],  [place] [address]  Followup IC and nephrolithiasis   HPI: Deborah Gray is a 89yo here for followup for IC and nephrolithiasis. She had flank pain and underwent CT 07/01/23 which shows a left lower pole 1-79mm calculus. Starting in January she developed worsening lower abdominal pain and passed a small stone in January. Her lower abdominal pain did not improve after passing the stone. The pain is worse with a full bladder. It is improved with urination. No gross hematuria. She is currently on mirabegron  and stopped the uroxatral  in January 2025.    PMH: Past Medical History:  Diagnosis Date   Arthritis    Benign neoplasm of colon    Constipation    Diaphragmatic hernia without mention of obstruction or gangrene    Diverticulosis of colon (without mention of hemorrhage)    Dysphagia, pharyngoesophageal phase    Esophageal reflux    Essential hypertension    Gastritis    Heart murmur    Hemorrhoids    Hyperlipidemia    Internal hemorrhoids without mention of complication    Interstitial cystitis    Left wrist fracture    PONV (postoperative nausea and vomiting)    PSVT (paroxysmal supraventricular tachycardia) (HCC)    Stricture and stenosis of esophagus    Type 2 diabetes mellitus (HCC)     Surgical History: Past Surgical History:  Procedure Laterality Date   ABDOMINAL HYSTERECTOMY     BACTERIAL OVERGROWTH TEST N/A 09/06/2015   Procedure: BACTERIAL OVERGROWTH TEST;  Surgeon: Suzette Espy, MD;  [facility];  Service: Endoscopy;  Laterality: N/A;  800   BIOPSY  01/02/2022   Procedure: BIOPSY;  Surgeon: Suzette Espy, MD;  [facility];  Service: Endoscopy;;   BIOPSY  11/18/2022   Procedure: BIOPSY;  Surgeon: Suzette Espy, MD;  [facility];  Service: Endoscopy;;   BREAST LUMPECTOMY Right    benign    CATARACT EXTRACTION Bilateral 2006   Implants in both, surgeries done 6 weeks apart   CHOLECYSTECTOMY     COLONOSCOPY  03/08/02   Adan Holms: diverticulosis, internal hemorrhoids, adenomatous colon polyp   COLONOSCOPY  01/23/04   Adan Holms: diverticulosis, internal hemorrhoids   COLONOSCOPY  09/25/05   Adan Holms: diverticulosis   COLONOSCOPY  07/05/09   Adan Holms: severe diverticulosis   COLONOSCOPY  01/21/11   Adan Holms: severe diverticulosis in sigmoid to desc colon, int hemorrhoids, follow up TCS in 5 years   COLONOSCOPY N/A 04/10/2016   [name]: diverticulosis   CYSTOSCOPY W/ URETERAL STENT PLACEMENT Right 03/15/2021   Procedure: CYSTOSCOPY WITH RETROGRADE PYELOGRAM/URETERAL STENT PLACEMENT;  Surgeon: Lahoma Pigg, MD;  Location: [facility];  Service: Urology;  Laterality: Right;   CYSTOSCOPY WITH RETROGRADE PYELOGRAM, URETEROSCOPY AND STENT PLACEMENT Right 03/29/2021   Procedure: CYSTOSCOPY WITH RETROGRADE PYELOGRAM, URETEROSCOPY AND STENT EXCHANGE;  Surgeon: Marco Severs, MD;  Location: [facility];  Service: Urology;  Laterality: Right;   DILATION AND CURETTAGE OF UTERUS     ESOPHAGEAL MANOMETRY  03/21/08   Patterson: findings c/w [place] Esophagus   ESOPHAGOGASTRODUODENOSCOPY  03/08/02   Adan Holms: esophageal stricture, chronic gerd, s/p dilation.    ESOPHAGOGASTRODUODENOSCOPY  01/23/04   Adan Holms: esophageal stricture, gastritis, hiatal hernia   ESOPHAGOGASTRODUODENOSCOPY  09/25/05   Adan Holms: gastritis, benign bx, no H.pylori   ESOPHAGOGASTRODUODENOSCOPY  07/05/09   Adan Holms: gastropathy, benign small bowel  bx and gastric bx   ESOPHAGOGASTRODUODENOSCOPY N/A 05/15/2015   Dr. Riley Cheadle- diffuse moderate inflammation characterized by congestion (edema), erythema, and linear erosions was found in the entire examined stomach. bx= reactive gastropathy   ESOPHAGOGASTRODUODENOSCOPY (EGD) WITH PROPOFOL  N/A 01/02/2022   Procedure: ESOPHAGOGASTRODUODENOSCOPY (EGD) WITH PROPOFOL ;  Surgeon: Suzette Espy, MD;  [facility];  Service: Endoscopy;  Laterality: N/A;  10:30am, asa 2   ESOPHAGOGASTRODUODENOSCOPY (EGD) WITH PROPOFOL  N/A 04/23/2023   Procedure: ESOPHAGOGASTRODUODENOSCOPY (EGD) WITH PROPOFOL ;  Surgeon: Suzette Espy, MD;  [facility];  Service: Endoscopy;  Laterality: N/A;  1130AM, ASA 3   FLEXIBLE SIGMOIDOSCOPY N/A 11/18/2022   Procedure: FLEXIBLE SIGMOIDOSCOPY;  Surgeon: Suzette Espy, MD;  [facility];  Service: Endoscopy;  Laterality: N/A;  10:00 am, asa 2   FOOT SURGERY Left    HEMORRHOID BANDING  2017   Dr.[name]   HOLMIUM LASER APPLICATION Right 03/29/2021   Procedure: HOLMIUM LASER APPLICATION;  Surgeon: Marco Severs, MD;  Location: [facility];  Service: Urology;  Laterality: Right;   LAPAROSCOPIC VAGINAL HYSTERECTOMY     MALONEY DILATION N/A 01/02/2022   Procedure: Londa Rival DILATION;  Surgeon: Suzette Espy, MD;  [facility];  Service: Endoscopy;  Laterality: N/A;   MALONEY DILATION N/A 04/23/2023   Procedure: DILATION, ESOPHAGUS, USING MALONEY DILATOR;  Surgeon: Suzette Espy, MD;  [facility];  Service: Endoscopy;  Laterality: N/A;   ORIF WRIST FRACTURE Left 06/23/2018   Procedure: OPEN REDUCTION INTERNAL FIXATION (ORIF) LEFT WRIST FRACTURE;  Surgeon: Saundra Curl, MD;  Location: [facility];  Service: Orthopedics;  Laterality: Left;  regional arm block   RECTOCELE REPAIR     TOTAL KNEE ARTHROPLASTY Right     Home Medications:  Allergies as of 07/16/2023       Reactions   Adhesive [tape] Other (See Comments)    Blisters    Estrogens Other (See Comments)   Migraines    Other Other (See Comments)   Sulfonamide Derivatives Rash        Medication List        Accurate as of Jul 16, 2023 10:13 AM. If you have any questions, ask your nurse or doctor.          acetaminophen  500 MG tablet Commonly known as: TYLENOL  Take 500 mg by mouth at bedtime.   ascorbic acid 500 MG  tablet Commonly known as: VITAMIN C Take 500 mg by mouth daily.   aspirin  EC 81 MG tablet Take 81 mg by mouth at bedtime.   benzonatate  200 MG capsule Commonly known as: TESSALON  Take 1 capsule (200 mg total) by mouth 3 (three) times daily as needed for cough.   cephALEXin  500 MG capsule Commonly known as: KEFLEX  Take 1 capsule (500 mg total) by mouth 2 (two) times daily.   cholecalciferol 25 MCG (1000 UNIT) tablet Commonly known as: VITAMIN D3 Take 1,000 Units by mouth at bedtime.   clidinium-chlordiazePOXIDE  5-2.5 MG capsule Commonly known as: LIBRAX Take 1 capsule by mouth 3 (three) times daily as needed (for esophageal symptoms).   Creon  24000-76000 units Cpep Generic drug: Pancrelipase  (Lip-Prot-Amyl) TAKE 3 CAPSULES THREE TIMES DAILY WITH MEALS AND ONE CAPSULE WITH SNACK TWICE DAILY   ezetimibe  10 MG tablet Commonly known as: ZETIA  TAKE 1 TABLET BY MOUTH EVERY DAY   famotidine  20 MG tablet Commonly known as: PEPCID  TAKE 1 TABLET BY MOUTH DAILY WITH SUPPER.   Guaifenesin 1200  MG Tb12 Take 1,200 mg by mouth daily with supper.   hyoscyamine  0.125 MG SL tablet Commonly known as: LEVSIN SL USE 1 TABLET UNDER THE TONGUE BEFORE MEALS AND AT BEDTIME AS NEEDED FOR FLARES IN SYMPTOMS, DIARRHEA/ABDOMINAL CRAMPING   ipratropium 0.03 % nasal spray Commonly known as: ATROVENT  Place 2 sprays into both nostrils every 12 (twelve) hours.   losartan  50 MG tablet Commonly known as: COZAAR  TAKE 1 TABLET BY MOUTH EVERY DAY   meclizine  25 MG tablet Commonly known as: ANTIVERT  Take 1 tablet (25 mg total) by mouth 3 (three) times daily as needed.   metFORMIN  500 MG tablet Commonly known as: GLUCOPHAGE  TAKE ONE TABLET BY MOUTH EVERY MORNING ONE AT LUNCH AND 2 AT BEDTIME   methenamine  1 g tablet Commonly known as: HIPREX  TAKE 1 TABLET BY MOUTH TWICE A DAY   mirabegron  ER 50 MG Tb24 tablet Commonly known as: Myrbetriq  Take 1 tablet (50 mg total) by mouth daily.    mometasone  50 MCG/ACT nasal spray Commonly known as: Nasonex  Place 2 sprays into the nose daily.   nateglinide  60 MG tablet Commonly known as: STARLIX  Take 1 tablet (60 mg total) by mouth 3 (three) times daily with meals.   omega-3 acid ethyl esters 1 g capsule Commonly known as: LOVAZA Take 2 g by mouth every morning.   PreserVision AREDS 2 Caps Take 1 capsule by mouth 2 (two) times daily.   RABEprazole  20 MG tablet Commonly known as: ACIPHEX  TAKE 1 TABLET BY MOUTH EVERY DAY   Theratears 0.25 % Soln Generic drug: Carboxymethylcellulose Sodium Place 1 drop into both eyes 2 (two) times daily.        Allergies:  Allergies  Allergen Reactions   Adhesive [Tape] Other (See Comments)     Blisters    Estrogens Other (See Comments)    Migraines    Other Other (See Comments)   Sulfonamide Derivatives Rash    Family History: Family History  Problem Relation Age of Onset   Ovarian cancer Mother    Diabetes Father    Heart disease Father    Breast cancer Sister    Liver cancer Sister    Colon cancer Cousin        Paternal side   Diabetes Maternal Aunt    Liver disease Cousin        Maternal side, never drank    Social History:  reports that she has never smoked. She has never used smokeless tobacco. She reports that she does not drink alcohol and does not use drugs.  ROS: All other review of systems were reviewed and are negative except what is noted above in HPI  Physical Exam: BP (!) 152/71   Pulse 98   Constitutional:  Alert and oriented, No acute distress. HEENT: [place] AT, moist mucus membranes.  Trachea midline, no masses. Cardiovascular: No clubbing, cyanosis, or edema. Respiratory: Normal respiratory effort, no increased work of breathing. GI: Abdomen is soft, nontender, nondistended, no abdominal masses GU: No CVA tenderness.  Lymph: No cervical or inguinal lymphadenopathy. Skin: No rashes, bruises or suspicious lesions. Neurologic: Grossly intact, no  focal deficits, moving all 4 extremities. Psychiatric: Normal mood and affect.  Laboratory Data: Lab Results  Component Value Date   WBC 8.8 12/30/2022   HGB 14.5 12/30/2022   HCT 44.5 12/30/2022   MCV 89 12/30/2022   PLT 293 12/30/2022    Lab Results  Component Value Date   CREATININE 0.89 12/30/2022    No results found  for: "PSA"  No results found for: "TESTOSTERONE"  Lab Results  Component Value Date   HGBA1C 6.7 (H) 12/30/2022    Urinalysis    Component Value Date/Time   COLORURINE YELLOW 03/15/2021 1740   APPEARANCEUR Clear 06/05/2023 1538   LABSPEC 1.015 03/15/2021 1740   PHURINE 5.0 03/15/2021 1740   GLUCOSEU Negative 06/05/2023 1538   HGBUR NEGATIVE 03/15/2021 1740   BILIRUBINUR negative 07/05/2023 1403   BILIRUBINUR Negative 06/05/2023 1538   KETONESUR negative 07/05/2023 1403   KETONESUR NEGATIVE 03/15/2021 1740   PROTEINUR trace (A) 07/05/2023 1403   PROTEINUR Negative 06/05/2023 1538   PROTEINUR 30 (A) 03/15/2021 1740   UROBILINOGEN 0.2 07/05/2023 1403   UROBILINOGEN 0.2 04/08/2008 1454   NITRITE Negative 07/05/2023 1403   NITRITE Negative 06/05/2023 1538   NITRITE NEGATIVE 03/15/2021 1740   LEUKOCYTESUR Trace (A) 07/05/2023 1403   LEUKOCYTESUR Negative 06/05/2023 1538   LEUKOCYTESUR LARGE (A) 03/15/2021 1740    Lab Results  Component Value Date   LABMICR Comment 06/05/2023   WBCUA 0-5 04/24/2023   LABEPIT 0-10 04/24/2023   MUCUS Present 08/29/2021   BACTERIA Few 04/24/2023    Pertinent Imaging: CT 07/01/2023: Images reviewed and discussed with the patient  Results for orders placed in visit on 10/09/22  DG Abd 1 View  Narrative CLINICAL DATA:  Abdominal pain.  EXAM: ABDOMEN - 1 VIEW  COMPARISON:  None Available.  FINDINGS: The bowel gas pattern is normal without gaseous distention. No radio-opaque calculi or other significant radiographic abnormality are seen. Increased stool noted consistent with  constipation.  IMPRESSION: Constipation.   Electronically Signed By: Sydell Eva M.D. On: 10/17/2022 15:45  No results found for this or any previous visit.  No results found for this or any previous visit.  No results found for this or any previous visit.  Results for orders placed during the hospital encounter of 07/11/21  US  RENAL  Narrative CLINICAL DATA:  History of nephrolithiasis.  Follow-up exam.  EXAM: RENAL / URINARY TRACT ULTRASOUND COMPLETE  COMPARISON:  Ultrasound renal 04/11/2021  FINDINGS: Right Kidney:  Renal measurements: 12.5 x 5.3 x 4.8 cm = volume: 166.2 mL. Echogenicity within normal limits. No mass or hydronephrosis visualized. Superior pole cyst measuring up to 3.5 cm.  Left Kidney:  Renal measurements: 11.3 x 5.7 x 5.0 cm = volume: 169.3 mL. Echogenicity within normal limits. No mass or hydronephrosis visualized. There are multiple cysts measuring up to 3 cm within the superior pole. There are at least 2 echogenic shadowing foci favored to represent stones measuring up to 5 mm.  Bladder:  Appears normal for degree of bladder distention.  Other:  None.  IMPRESSION: No hydronephrosis.  Probable left nephrolithiasis.   Electronically Signed By: Gray Neither M.D. On: 07/12/2021 08:42  No results found for this or any previous visit.  No results found for this or any previous visit.  Results for orders placed during the hospital encounter of 07/01/23  CT RENAL STONE STUDY  Narrative EXAMINATION: CT RENAL STONE STUDY  CLINICAL INDICATION: Female, 89 years old. Abdominal/flank pain, stone suspected  TECHNIQUE: Helical CT scan examination of the abdomen and pelvis is performed from the domes of the diaphragm to the pubic symphysis. Limited evaluation of the solid organs due to lack of intravenous contrast. Unless otherwise specified, incidental thyroid , adrenal, renal lesions do not require dedicated imaging follow up.  Additionally, any mentioned pulmonary nodules do not require dedicated imaging follow-up based on the Fleischner guidelines unless otherwise specified.  Coronary calcifications are not identified unless otherwise specified.  COMPARISON: 03/10/2022  FINDINGS:  There bronchiectatic changes noted in the right middle lobe with scarring noted in the lung bases. The heart is normal in size. There are coronary calcifications. The liver appears normal. The gallbladder is surgically absent. The spleen is normal. The pancreas appears normal. The adrenals are normal. Bilateral renal cysts are noted. Subcentimeter probable hemorrhagic left renal cyst is seen. The abdominal aorta is normal in caliber. Scattered calcified atherosclerotic changes are present. The urinary bladder appears normal though note is made of pelvic floor laxity. The uterus is surgically absent. There is colonic diverticulosis. Large and small bowel loops are otherwise within normal limits. There is no free fluid or pathologic lymphadenopathy by size criteria. There is diffuse osseous demineralization. There are old compression fractures at L1 and L5. There are degenerative changes of the spine and bony pelvis. Small fat-containing left inguinal hernia noted.  IMPRESSION:  No acute findings in the abdomen or pelvis. No definite renal calculi or hydronephrosis appreciated.  Various findings as above.  DOSE REDUCTION: This exam was performed according to our departmental dose-optimization program which includes automated exposure control, adjustment of the mA and/or kV according to patient size and/or use of iterative reconstruction technique.  Electronically signed by: [place] Engel MD 07/01/2023 07:19 PM EDT RP Workstation: [id number]   Assessment & Plan:    1. Interstitial cystitis (Primary) -restart mirabegron  50mg  daily -restart uroxatral  10mg  daily - Urinalysis, Routine w reflex microscopic  2.  Nephrolithiasis -followup 6 months with renal US    No follow-ups on file.  Johnie Nailer, MD  [facility] [place]

## 2023-07-16 NOTE — Progress Notes (Signed)
 post void residual=106 ?

## 2023-07-16 NOTE — Telephone Encounter (Signed)
 Return call to patient. Patient state's she thought Dr. Claretta Croft want her to take Myrbetriq  bid. Patient is made aware according to Dr. Claretta Croft note she is to restart both Myrbetriq  50 mg once daily and Alfuzosin  10 mg at bedtime. Patient voiced understanding

## 2023-07-16 NOTE — Patient Instructions (Signed)

## 2023-07-16 NOTE — Progress Notes (Signed)
 Bladder Instillation DMSO  Due to IC patient is present today for a Bladder Instillation of DMSO treatment. Patient was cleaned and prepped in a sterile fashion with Betadinex3.  A 24FR catheter was inserted, urine return was noted , urine was Clear yellow In color.  DMSO treatment was instilled into the bladder. The catheter was then removed. Patient tolerated well, no complications were noted pt was instructed to hold the instillation for 20 minutes and then will void it out. Pt voiced understanding.    Preformed by: Wyonia Hefty, CMA  Follow up/ Additional notes: Follow up in 4 weeks and 1 year with MD

## 2023-07-16 NOTE — Addendum Note (Signed)
 Addended by: Dorma Gash on: 07/16/2023 11:11 AM   Modules accepted: Orders

## 2023-07-17 ENCOUNTER — Ambulatory Visit: Admitting: Family Medicine

## 2023-07-17 ENCOUNTER — Telehealth: Payer: Self-pay | Admitting: Family Medicine

## 2023-07-17 VITALS — BP 128/73 | HR 87 | Temp 98.1°F | Ht 64.0 in | Wt 141.0 lb

## 2023-07-17 DIAGNOSIS — J329 Chronic sinusitis, unspecified: Secondary | ICD-10-CM | POA: Diagnosis not present

## 2023-07-17 MED ORDER — AMOXICILLIN-POT CLAVULANATE 875-125 MG PO TABS: 1.0000 | ORAL_TABLET | Freq: Two times a day (BID) | ORAL | 0 refills | Status: DC

## 2023-07-17 NOTE — Telephone Encounter (Signed)
 Patient is wanting know when is she needing labs done and her next visit is due also

## 2023-07-18 ENCOUNTER — Other Ambulatory Visit: Payer: Self-pay

## 2023-07-18 DIAGNOSIS — Z79899 Other long term (current) drug therapy: Secondary | ICD-10-CM

## 2023-07-18 DIAGNOSIS — I10 Essential (primary) hypertension: Secondary | ICD-10-CM

## 2023-07-18 DIAGNOSIS — E119 Type 2 diabetes mellitus without complications: Secondary | ICD-10-CM

## 2023-07-18 DIAGNOSIS — E785 Hyperlipidemia, unspecified: Secondary | ICD-10-CM

## 2023-07-18 NOTE — Telephone Encounter (Signed)
 Spoke with pt and informed per drs notes.

## 2023-07-20 DIAGNOSIS — J329 Chronic sinusitis, unspecified: Secondary | ICD-10-CM | POA: Insufficient documentation

## 2023-07-20 NOTE — Progress Notes (Signed)
 Subjective:  Patient ID: Deborah Gray, female    DOB: Jun 05, 1933  Age: 89 y.o. MRN: [id number]  CC:   Chief Complaint  Patient presents with   post covid check-    Still has drainage in throat and hard to clear - using rinse, sprays, and mucinex   special shoes     Feet require a special made shoe - podiatry and pcp will have to fill out form - does not currently have - will drop it off     HPI:  89 year old female presents for evaluation of the above.  Patient recently diagnosed and treated for COVID (5/17). Also treated for UTI. Patient reports that she continues to have respiratory symptoms.  Reports congestion and drainage. Has a history of chronic sinusitis. Has been using sinus rinse, mucinex, and nasal sprays without improvement. No fever.  Social Hx   Social History   Socioeconomic History   Marital status: Widowed    Spouse name: Not on file   Number of children: 3   Years of education: Not on file   Highest education level: Not on file  Occupational History   Occupation: Retired  Tobacco Use   Smoking status: Never   Smokeless tobacco: Never   Tobacco comments:    Never smoked  Vaping Use   Vaping status: Never Used  Substance and Sexual Activity   Alcohol use: No    Alcohol/week: 0.0 standard drinks of alcohol   Drug use: No   Sexual activity: Not Currently  Other Topics Concern   Not on file  Social History Narrative   Widowed since 07/2018. Married x 61 years.   3 sons. 40, 1963 and 1966.   Social Drivers of Corporate investment banker Strain: Low Risk  (05/31/2022)   Overall Financial Resource Strain (CARDIA)    Difficulty of Paying Living Expenses: Not hard at all  Food Insecurity: No Food Insecurity (05/31/2022)   Hunger Vital Sign    Worried About Running Out of Food in the Last Year: Never true    Ran Out of Food in the Last Year: Never true  Transportation Needs: No Transportation Needs (05/31/2022)   PRAPARE - Doctor, general practice (Medical): No    Lack of Transportation (Non-Medical): No  Physical Activity: Sufficiently Active (05/31/2022)   Exercise Vital Sign    Days of Exercise per Week: 5 days    Minutes of Exercise per Session: 30 min  Stress: No Stress Concern Present (05/31/2022)   [employer] of Occupational Health - Occupational Stress Questionnaire    Feeling of Stress : Not at all  Social Connections: Moderately Integrated (05/31/2022)   Social Connection and Isolation Panel [NHANES]    Frequency of Communication with Friends and Family: More than three times a week    Frequency of Social Gatherings with Friends and Family: More than three times a week    Attends Religious Services: More than 4 times per year    Active Member of [employer] or Organizations: Yes    Attends Banker Meetings: More than 4 times per year    Marital Status: Widowed    Review of Systems Per HPI  Objective:  BP 128/73   Pulse 87   Temp 98.1 F (36.7 C)   Ht 5\' 4"  (1.626 m)   Wt 141 lb (64 kg)   SpO2 95%   BMI 24.20 kg/m      07/17/2023  3:38 PM 07/16/2023   10:04 AM 07/05/2023    1:51 PM  BP/Weight  Systolic BP 128 152 119  Diastolic BP 73 71 70  Wt. (Lbs) 141    BMI 24.2 kg/m2      Physical Exam Vitals and nursing note reviewed.  Constitutional:      General: She is not in acute distress.    Appearance: Normal appearance.  HENT:     Head: Normocephalic and atraumatic.  Cardiovascular:     Rate and Rhythm: Normal rate and regular rhythm.  Pulmonary:     Effort: Pulmonary effort is normal.     Breath sounds: Normal breath sounds.  Neurological:     Mental Status: She is alert.  Psychiatric:        Mood and Affect: Mood normal.        Behavior: Behavior normal.     Lab Results  Component Value Date   WBC 8.8 12/30/2022   HGB 14.5 12/30/2022   HCT 44.5 12/30/2022   PLT 293 12/30/2022   GLUCOSE 151 (H) 12/30/2022   CHOL 184 12/30/2022   TRIG 85 12/30/2022    HDL 71 12/30/2022   LDLCALC 98 12/30/2022   ALT 13 12/30/2022   AST 15 12/30/2022   NA 144 12/30/2022   K 5.2 12/30/2022   CL 104 12/30/2022   CREATININE 0.89 12/30/2022   BUN 24 12/30/2022   CO2 24 12/30/2022   TSH 2.720 11/19/2021   INR 1.7 (H) 04/08/2008   HGBA1C 6.7 (H) 12/30/2022   MICROALBUR 1.0 11/22/2013     Assessment & Plan:  Chronic sinusitis, unspecified location Assessment & Plan: Treating with Augmentin .   Other orders -     Amoxicillin -Pot Clavulanate; Take 1 tablet by mouth 2 (two) times daily.  Dispense: 14 tablet; Refill: 0   Kaled Allende DO [facility]

## 2023-07-20 NOTE — Progress Notes (Unsigned)
 [place]  Deborah Gray, female    DOB: April 17, 1933    MRN: [id number]   Brief patient profile:  4 yowf never smoker  retired Geophysicist/field seismologist referred to pulmonary clinic in [place]  03/21/2022 by Dr Kathleen Papa  for incidental lung nodules on w/u for hematuria.   Remembers bad bronchitis age 89 had to miss school    History of Present Illness  03/21/2022  Pulmonary/ 1st office eval/ [name] / Sales executive Complaint  Patient presents with   Consult    Ct scan abnormal   Dyspnea:  [place]/ [place] cooking / limited by back but still doing [employer] / hc parking depends on whether back bothering her  Cough: none  but always botherd by nasal congestion f/u by [employer]  Sleep: occ night sweats x 2 year / no wt loss or resp cc noct  SABA use: none  02: none  Rec You multiple nodules that the other with largest calcified typical of a benign process  To be sure, we will repeat your CT no contrast  in 3 months and see you back afterwords   - 03/21/2022  ESR 9 - 03/21/2022  Quant TB neg - 03/21/2022 cbc with diff   Eos 0.1    12/26/2022  f/u ov/[place] office/[name] re: MPNs  maint on max gerd rx    Chief Complaint  Patient presents with   Cough  Dyspnea:  [employer] pushing cart  Cough: sensation of globus daytime better p breakfast / [place] neg eval   Sleeping: elevated mattress on thick quilts s    resp cc  SABA use: none  02: none   Rec GERD diet reviewed, bed blocks rec  My office will be contacting you by phone for referral to [facility]  > seen by [facility] 02/04/23  gerd  Exam including flexible laryngoscopy with VF atrophy, laryngeal tremor, she has head tremor hx as well. She also had GERD LPR changes on exam. There was mild pooling of secretions noted. She had evidence of scant purulent crusting along posterior nasopharynx but no pus or polyps in nasal passages. She had evidence of post-nasal drainage.     -Consider voice therapy in the future. We discussed it  but she would like to hold off at this time - MBS esophagram - medical management of GERD LPR and post-nasal drainage    Chronic Nasal Congestion: Persistent despite use of saline and Flonase . Noted thick mucus along posterior nasopharyngeal wall -Nasonex  2 puffs b/l nares in am and Ipratropium Bromide  2 puffs b/l nares in pm -Initiate saline nasal rinses once or twice daily. -Consider sinus CT scan in the future to evaluate for chronic sinus disease if sx will not improve - start weaning off Mucinex, we discussed the dose is too high   Chronic dysphagia  History of choking with eating, esophageal spasms, extensive GI hx of distal esophageal rings, esophagitis, on medication for GERD and esophageal spasm. Hx of hiatal hernia -Order swallow study to evaluate for aspiration and other swallowing disorders - MBS esophagram - continue Asiphex and Levsin per GI recommendations  03/05/23 Single contrast esophagram significant for: 1.  Intermittent vestibular penetration without aspiration. 2.  Patulous proximal esophagus. 3. Severe lower esophageal dysmotility with proximal escape waves and contrast stasis in the upper esophagus. Retrograde flow of contrast from the lower esophagus up to the level of the aortic notch. 4. Barium tablet became stuck briefly just inferior to the aortic notch  and again at the gastroesophageal junction, suggestive of possible esophageal strictures at these locations    07/23/2023  f/u ov/[place] office/[name] re: uacs/ gerd  maint on ***  No chief complaint on file.   Dyspnea:  *** Cough: *** Sleeping: ***   resp cc  SABA use: *** 02: ***  Lung cancer screening: ***   No obvious day to day or daytime variability or assoc excess/ purulent sputum or mucus plugs or hemoptysis or cp or chest tightness, subjective wheeze or overt sinus or hb symptoms.    Also denies any obvious fluctuation of symptoms with weather or environmental changes or other aggravating  or alleviating factors except as outlined above   No unusual exposure hx or h/o childhood pna/ asthma or knowledge of premature birth.  Current Allergies, Complete Past Medical History, Past Surgical History, Family History, and Social History were reviewed in [employer] record.  ROS  The following are not active complaints unless bolded Hoarseness, sore throat, dysphagia, dental problems, itching, sneezing,  nasal congestion or discharge of excess mucus or purulent secretions, ear ache,   fever, chills, sweats, unintended wt loss or wt gain, classically pleuritic or exertional cp,  orthopnea pnd or arm/hand swelling  or leg swelling, presyncope, palpitations, abdominal pain, anorexia, nausea, vomiting, diarrhea  or change in bowel habits or change in bladder habits, change in stools or change in urine, dysuria, hematuria,  rash, arthralgias, visual complaints, headache, numbness, weakness or ataxia or problems with walking or coordination,  change in mood or  memory.        No outpatient medications have been marked as taking for the 07/23/23 encounter (Appointment) with Diamond Formica, MD.   Current Facility-Administered Medications for the 07/23/23 encounter (Appointment) with Diamond Formica, MD  Medication   lidocaine  HCl (PF) (XYLOCAINE ) 2 % injection 10 mL               Past Medical History:  Diagnosis Date   Arthritis    Benign neoplasm of colon    Constipation    Diaphragmatic hernia without mention of obstruction or gangrene    Diverticulosis of colon (without mention of hemorrhage)    Dysphagia, pharyngoesophageal phase    Esophageal reflux    Essential hypertension    Heart murmur    Hemorrhoids    Hyperlipidemia    Internal hemorrhoids without mention of complication    Left wrist fracture    PONV (postoperative nausea and vomiting)    Stricture and stenosis of esophagus    Type 2 diabetes mellitus (HCC)    Unspecified gastritis and  gastroduodenitis without mention of hemorrhage        Objective:    wts   07/23/2023           ***  12/26/2022        141   06/25/22 141 lb 9.6 oz (64.2 kg)  06/04/22 137 lb 12.8 oz (62.5 kg)  05/31/22 139 lb (63 kg)    Vital signs reviewed  07/23/2023  - Note at rest 02 sats  ***% on ***   General appearance:    ***    Severely kyphotic ***        Assessment

## 2023-07-20 NOTE — Assessment & Plan Note (Signed)
 Treating with Augmentin.

## 2023-07-21 ENCOUNTER — Other Ambulatory Visit: Payer: Self-pay | Admitting: Family Medicine

## 2023-07-21 MED ORDER — DOXYCYCLINE HYCLATE 100 MG PO TABS: 100.0000 mg | ORAL_TABLET | Freq: Two times a day (BID) | ORAL | 0 refills | Status: DC

## 2023-07-22 ENCOUNTER — Ambulatory Visit: Payer: Self-pay

## 2023-07-22 NOTE — Telephone Encounter (Signed)
 Copied from CRM [id number]. Topic: Clinical - Prescription Issue >> Jul 22, 2023  9:46 AM Kevelyn M wrote: Reason for CRM: Patient called in because she was prescribed Amoxicillin  without the coating and it burned her stomach, then was prescribed doxycycline  (VIBRA -TABS) 100 MG tablet and she can't take this because it gives her diarrhea. She needs another antibiotic. Please advise. [phone]

## 2023-07-23 ENCOUNTER — Ambulatory Visit (INDEPENDENT_AMBULATORY_CARE_PROVIDER_SITE_OTHER): Admitting: Internal Medicine

## 2023-07-23 ENCOUNTER — Ambulatory Visit (HOSPITAL_COMMUNITY)
Admission: RE | Admit: 2023-07-23 | Discharge: 2023-07-23 | Disposition: A | Discharge status: Home / Self Care | Source: Ambulatory Visit | Attending: Internal Medicine | Admitting: Internal Medicine

## 2023-07-23 ENCOUNTER — Telehealth: Payer: Self-pay

## 2023-07-23 ENCOUNTER — Encounter: Payer: Self-pay | Admitting: Internal Medicine

## 2023-07-23 VITALS — BP 128/70 | HR 87 | Ht 64.0 in | Wt 144.2 lb

## 2023-07-23 DIAGNOSIS — R058 Other specified cough: Secondary | ICD-10-CM

## 2023-07-23 DIAGNOSIS — R918 Other nonspecific abnormal finding of lung field: Secondary | ICD-10-CM

## 2023-07-23 MED ORDER — AZITHROMYCIN 250 MG PO TABS: ORAL_TABLET | ORAL | 11 refills | Status: DC

## 2023-07-23 NOTE — Assessment & Plan Note (Signed)
 See incidental CT chest 03/20/22 during w/u for hematuria -  03/21/2022  ESR 9 - 03/21/2022  Quant TB neg - 03/21/2022 cbc with diff   Eos 0.1 -  Chest CT  06/19/22 improved MPN/ wax and wane and assoc bronchiectasis  -  07/23/2023 refillable zpak for "nasty mucus"  This is an extremely common benign condition in the elderly and does not warrant aggressive eval/ rx at this point unless there is a clinical correlation suggesting unaddressed pulmonary infection (purulent sputum, night sweats, unintended wt loss, doe) or evolution of  obvious changes on plain cxr (as opposed to serial CT, which is way over sensitive to make clinical decisions re intervention and treatment in the elderly, who tend to tolerate both dx and treatment poorly) .  In fact, in study published July 2024 in [facility],  patients with bronchiectasis with vs without proven atypical TB had similar outcomes x 5 years of the study, in temis of admits/loss of lung function, exac and death.   >>> add zpak prn nasty muscus to max mucinex

## 2023-07-23 NOTE — Assessment & Plan Note (Signed)
 Referred to ent 12/26/2022 >>> [place] f/u   >> ENT rec Sinus CT/ pt declined > directed her back for f/u for any flare in rhinitis/ sinus symptoms  Each maintenance medication was reviewed in detail including emphasizing most importantly the difference between maintenance and prns and under what circumstances the prns are to be triggered using an action plan format where appropriate.  Total time for H and P, chart review, counseling,   and generating customized AVS unique to this office visit / same day charting = 

## 2023-07-23 NOTE — Patient Instructions (Addendum)
 For nasty mucus  > Zpak (refillable)   Please remember to go to the  x-ray department  @  [facility] for your tests - we will call you with the results when they are available     Please schedule a follow up visit in 12 months but call sooner if needed

## 2023-07-23 NOTE — Telephone Encounter (Signed)
 Patient is calling to letting Dr Debrah Fan no need for another antibiotic Dr Waymond Hailey she seen her lunch specialist so he went ahead and called her a z pack in.

## 2023-07-25 ENCOUNTER — Ambulatory Visit: Payer: Self-pay

## 2023-07-28 ENCOUNTER — Ambulatory Visit: Admitting: Urology

## 2023-07-29 ENCOUNTER — Ambulatory Visit: Payer: Self-pay | Admitting: Internal Medicine

## 2023-07-29 NOTE — Progress Notes (Signed)
Patient has reviewed results and recommendations via My Chart.

## 2023-07-31 LAB — CBC WITH DIFFERENTIAL/PLATELET
Basophils Absolute: 0 10*3/uL (ref 0.0–0.2)
Basos: 0 %
EOS (ABSOLUTE): 0.2 10*3/uL (ref 0.0–0.4)
Eos: 2 %
Hematocrit: 39.7 % (ref 34.0–46.6)
Hemoglobin: 12.6 g/dL (ref 11.1–15.9)
Immature Grans (Abs): 0 10*3/uL (ref 0.0–0.1)
Immature Granulocytes: 0 %
Lymphocytes Absolute: 1.7 10*3/uL (ref 0.7–3.1)
Lymphs: 24 %
MCH: 29.2 pg (ref 26.6–33.0)
MCHC: 31.7 g/dL (ref 31.5–35.7)
MCV: 92 fL (ref 79–97)
Monocytes Absolute: 0.6 10*3/uL (ref 0.1–0.9)
Monocytes: 9 %
Neutrophils Absolute: 4.4 10*3/uL (ref 1.4–7.0)
Neutrophils: 65 %
Platelets: 227 10*3/uL (ref 150–450)
RBC: 4.31 x10E6/uL (ref 3.77–5.28)
RDW: 14 % (ref 11.7–15.4)
WBC: 6.9 10*3/uL (ref 3.4–10.8)

## 2023-07-31 LAB — LIPID PANEL
Chol/HDL Ratio: 2.4 ratio (ref 0.0–4.4)
Cholesterol, Total: 166 mg/dL (ref 100–199)
HDL: 68 mg/dL (ref >39)
LDL Chol Calc (NIH): 87 mg/dL (ref 0–99)
Triglycerides: 52 mg/dL (ref 0–149)
VLDL Cholesterol Cal: 11 mg/dL (ref 5–40)

## 2023-07-31 LAB — MICROALBUMIN / CREATININE URINE RATIO
Creatinine, Urine: 85.7 mg/dL
Microalb/Creat Ratio: 27 mg/g{creat} (ref 0–29)
Microalbumin, Urine: 23.5 ug/mL

## 2023-07-31 LAB — COMPREHENSIVE METABOLIC PANEL WITH GFR
ALT: 15 IU/L (ref 0–32)
AST: 15 IU/L (ref 0–40)
Albumin: 4.5 g/dL (ref 3.7–4.7)
Alkaline Phosphatase: 101 IU/L (ref 44–121)
BUN/Creatinine Ratio: 32 — ABNORMAL HIGH (ref 12–28)
BUN: 25 mg/dL (ref 8–27)
Bilirubin Total: 0.4 mg/dL (ref 0.0–1.2)
CO2: 21 mmol/L (ref 20–29)
Calcium: 10 mg/dL (ref 8.7–10.3)
Chloride: 105 mmol/L (ref 96–106)
Creatinine, Ser: 0.79 mg/dL (ref 0.57–1.00)
Globulin, Total: 2.1 g/dL (ref 1.5–4.5)
Glucose: 116 mg/dL — ABNORMAL HIGH (ref 70–99)
Potassium: 5.1 mmol/L (ref 3.5–5.2)
Sodium: 145 mmol/L — ABNORMAL HIGH (ref 134–144)
Total Protein: 6.6 g/dL (ref 6.0–8.5)
eGFR: 72 mL/min/{1.73_m2} (ref >59)

## 2023-07-31 LAB — HEMOGLOBIN A1C
Est. average glucose Bld gHb Est-mCnc: 128 mg/dL
Hgb A1c MFr Bld: 6.1 % — ABNORMAL HIGH (ref 4.8–5.6)

## 2023-08-04 ENCOUNTER — Other Ambulatory Visit: Payer: Self-pay | Admitting: Urology

## 2023-08-04 ENCOUNTER — Ambulatory Visit: Payer: Self-pay | Admitting: Family Medicine

## 2023-08-04 DIAGNOSIS — N3010 Interstitial cystitis (chronic) without hematuria: Secondary | ICD-10-CM

## 2023-08-04 DIAGNOSIS — R350 Frequency of micturition: Secondary | ICD-10-CM

## 2023-08-04 DIAGNOSIS — R3915 Urgency of urination: Secondary | ICD-10-CM | POA: Insufficient documentation

## 2023-08-04 DIAGNOSIS — N3941 Urge incontinence: Secondary | ICD-10-CM | POA: Insufficient documentation

## 2023-08-04 HISTORY — DX: Frequency of micturition: R35.0

## 2023-08-04 MED ORDER — MIRABEGRON ER 50 MG PO TB24: 50.0000 mg | ORAL_TABLET | Freq: Every day | ORAL | 3 refills | Status: DC

## 2023-08-06 ENCOUNTER — Ambulatory Visit: Admitting: Family Medicine

## 2023-08-06 ENCOUNTER — Encounter: Payer: Self-pay | Admitting: Family Medicine

## 2023-08-06 VITALS — BP 124/74 | HR 90 | Ht 64.0 in | Wt 142.8 lb

## 2023-08-06 DIAGNOSIS — E119 Type 2 diabetes mellitus without complications: Secondary | ICD-10-CM | POA: Diagnosis not present

## 2023-08-06 DIAGNOSIS — I10 Essential (primary) hypertension: Secondary | ICD-10-CM

## 2023-08-06 DIAGNOSIS — Z7984 Long term (current) use of oral hypoglycemic drugs: Secondary | ICD-10-CM

## 2023-08-06 DIAGNOSIS — E785 Hyperlipidemia, unspecified: Secondary | ICD-10-CM

## 2023-08-06 MED ORDER — LOSARTAN POTASSIUM 50 MG PO TABS
50.0000 mg | ORAL_TABLET | Freq: Every day | ORAL | 3 refills | Status: DC
Start: 2023-08-06 — End: 2023-11-03

## 2023-08-06 NOTE — Assessment & Plan Note (Addendum)
 Well controlled. Would like to discontinue Starlix  but patient reluctant.  Continue Starlix  and Metformin . Form filled out for diabetic shoes.

## 2023-08-06 NOTE — Assessment & Plan Note (Signed)
Stable. Continue Losartan. 

## 2023-08-06 NOTE — Patient Instructions (Signed)
 Continue your medications.    Follow up in 6 months

## 2023-08-06 NOTE — Progress Notes (Signed)
 Subjective:  Patient ID: Deborah  Estil Gray, female    DOB: Jun 24, 1933  Age: 89 y.o. MRN: [id number]  CC:  Follow up   HPI:  89 year old female presents for follow up.  HTN well controlled. A1c at goal (would actually prefer A1c to be higher at her age).  Patient hesitant to discontinue Starlix . Fair control of lipids on Zetia .  Needs form filled out for Diabetic shows.  Has chronic issues with IC and GI issues, but overall doing well.   Patient Active Problem List   Diagnosis Date Noted   Urge incontinence 08/04/2023   Upper airway cough syndrome 12/26/2022   Interstitial cystitis 10/24/2022   History of recurrent UTIs 10/24/2022   Bilateral renal cysts 10/24/2022   Osteoporosis 07/05/2022   Multiple pulmonary nodules determined by computed tomography of lung 03/21/2022   Nephrolithiasis 03/15/2021   Prolapsed internal hemorrhoids, grade 3 01/23/2021   Spinal stenosis of lumbar region 12/02/2017   Osteoarthritis of left knee 12/02/2017   Degeneration of lumbar intervertebral disc 11/18/2017   IBS (irritable bowel syndrome) 01/30/2015   Type 2 diabetes mellitus (HCC) 12/22/2014   Hyperlipidemia, unspecified 12/22/2014   Essential hypertension 11/25/2013   Chronic pancreatitis (HCC) 02/26/2011   Gastroesophageal reflux disease 01/18/2011    Social Hx   Social History   Socioeconomic History   Marital status: Widowed    Spouse name: Not on file   Number of children: 3   Years of education: Not on file   Highest education level: Not on file  Occupational History   Occupation: Retired  Tobacco Use   Smoking status: Never   Smokeless tobacco: Never   Tobacco comments:    Never smoked  Vaping Use   Vaping status: Never Used  Substance and Sexual Activity   Alcohol use: No    Alcohol/week: 0.0 standard drinks of alcohol   Drug use: No   Sexual activity: Not Currently  Other Topics Concern   Not on file  Social History Narrative   Widowed since 07/2018. Married  x 61 years.   3 sons. 32, 1963 and 1966.   Social Drivers of Corporate investment banker Strain: Low Risk  (05/31/2022)   Overall Financial Resource Strain (CARDIA)    Difficulty of Paying Living Expenses: Not hard at all  Food Insecurity: No Food Insecurity (05/31/2022)   Hunger Vital Sign    Worried About Running Out of Food in the Last Year: Never true    Ran Out of Food in the Last Year: Never true  Transportation Needs: No Transportation Needs (05/31/2022)   PRAPARE - Administrator, Civil Service (Medical): No    Lack of Transportation (Non-Medical): No  Physical Activity: Sufficiently Active (05/31/2022)   Exercise Vital Sign    Days of Exercise per Week: 5 days    Minutes of Exercise per Session: 30 min  Stress: No Stress Concern Present (05/31/2022)   [employer] of Occupational Health - Occupational Stress Questionnaire    Feeling of Stress : Not at all  Social Connections: Moderately Integrated (05/31/2022)   Social Connection and Isolation Panel    Frequency of Communication with Friends and Family: More than three times a week    Frequency of Social Gatherings with Friends and Family: More than three times a week    Attends Religious Services: More than 4 times per year    Active Member of [employer] or Organizations: Yes    Attends Banker Meetings:  More than 4 times per year    Marital Status: Widowed    Review of Systems Per HPI  Objective:  BP 124/74   Pulse 90   Ht 5' 4 (1.626 m)   Wt 142 lb 12.8 oz (64.8 kg)   SpO2 95%   BMI 24.51 kg/m      08/06/2023    2:36 PM 07/23/2023   10:20 AM 07/17/2023    3:38 PM  BP/Weight  Systolic BP 124 128 128  Diastolic BP 74 70 73  Wt. (Lbs) 142.8 144.2 141  BMI 24.51 kg/m2 24.75 kg/m2 24.2 kg/m2    Physical Exam Vitals and nursing note reviewed.  Constitutional:      General: She is not in acute distress.    Appearance: Normal appearance.  HENT:     Head: Normocephalic and atraumatic.    Eyes:     General:        Right eye: No discharge.        Left eye: No discharge.     Conjunctiva/sclera: Conjunctivae normal.    Cardiovascular:     Rate and Rhythm: Normal rate and regular rhythm.  Pulmonary:     Effort: Pulmonary effort is normal.     Breath sounds: Normal breath sounds. No wheezing, rhonchi or rales.   Neurological:     Mental Status: She is alert.   Psychiatric:        Mood and Affect: Mood normal.        Behavior: Behavior normal.     Lab Results  Component Value Date   WBC 6.9 07/28/2023   HGB 12.6 07/28/2023   HCT 39.7 07/28/2023   PLT 227 07/28/2023   GLUCOSE 116 (H) 07/28/2023   CHOL 166 07/28/2023   TRIG 52 07/28/2023   HDL 68 07/28/2023   LDLCALC 87 07/28/2023   ALT 15 07/28/2023   AST 15 07/28/2023   NA 145 (H) 07/28/2023   K 5.1 07/28/2023   CL 105 07/28/2023   CREATININE 0.79 07/28/2023   BUN 25 07/28/2023   CO2 21 07/28/2023   TSH 2.720 11/19/2021   INR 1.7 (H) 04/08/2008   HGBA1C 6.1 (H) 07/28/2023   MICROALBUR 1.0 11/22/2013     Assessment & Plan:  Essential hypertension Assessment & Plan: Stable. Continue Losartan .  Orders: -     Losartan  Potassium; Take 1 tablet (50 mg total) by mouth daily.  Dispense: 90 tablet; Refill: 3  Type 2 diabetes mellitus without complication, without long-term current use of insulin  (HCC) Assessment & Plan: Well controlled. Would like to discontinue Starlix  but patient reluctant.  Continue Starlix  and Metformin . Form filled out for diabetic shoes.   Hyperlipidemia, unspecified hyperlipidemia type Assessment & Plan: Fair control (especially given age). Continue Zetia .     Follow-up:  6 months  Akeylah Hendel Debrah Fan DO [facility]

## 2023-08-06 NOTE — Assessment & Plan Note (Signed)
 Fair control (especially given age). Continue Zetia .

## 2023-08-08 ENCOUNTER — Telehealth: Payer: Self-pay | Admitting: Urology

## 2023-08-08 NOTE — Telephone Encounter (Signed)
 Dysuria  Patient called with c/o dysuria x 24 hours days.  Pain: constant, burning, and stabbing  Severity:8/10  Associated Signs and Symptoms:  Fever: noTemp. Chills: no Hematuria: no Urgency: yes Frequency: yes Hesitancy:no Incontinence: no Nausea: no Vomiting: no  Message sent to clinic staff to return call to patient to advise of plan.

## 2023-08-09 ENCOUNTER — Ambulatory Visit
Admission: EM | Admit: 2023-08-09 | Discharge: 2023-08-09 | Disposition: A | Discharge status: Home / Self Care | Attending: Internal Medicine | Admitting: Internal Medicine

## 2023-08-09 DIAGNOSIS — N3001 Acute cystitis with hematuria: Secondary | ICD-10-CM | POA: Diagnosis present

## 2023-08-09 DIAGNOSIS — N390 Urinary tract infection, site not specified: Secondary | ICD-10-CM | POA: Diagnosis present

## 2023-08-09 LAB — POCT URINALYSIS DIP (MANUAL ENTRY)
Bilirubin, UA: NEGATIVE
Glucose, UA: NEGATIVE mg/dL
Ketones, POC UA: NEGATIVE mg/dL
Nitrite, UA: POSITIVE — AB
Protein Ur, POC: 100 mg/dL — AB
Spec Grav, UA: 1.015 (ref 1.010–1.025)
Urobilinogen, UA: 0.2 U/dL
pH, UA: 6 (ref 5.0–8.0)

## 2023-08-09 MED ORDER — CEPHALEXIN 500 MG PO CAPS: 500.0000 mg | ORAL_CAPSULE | Freq: Two times a day (BID) | ORAL | 0 refills | Status: AC

## 2023-08-09 NOTE — ED Provider Notes (Signed)
 [facility]    CSN: [id number] Arrival date & time: 08/09/23  1214      History   Chief Complaint No chief complaint on file.   HPI Deborah Gray is a 89 y.o. female.   Deborah Gray is a 89 y.o. female presenting for chief complaint of bladder pressure, urinary frequency, and urinary urgency that started 2 nights ago.  History of interstitial cystitis, she receives bladder treatments for interstitial cystitis once monthly and last received this treatment 3 weeks ago.  She is concerned that she may have an acute recurrent urinary tract infection.  Denies dysuria, nausea, vomiting, diarrhea, flank pain, low back pain, fever, chills, confusion, dizziness, and headaches.  She had a kidney stone in March 2025, then had an acute urinary tract infection in May 2025 treated with Keflex .  Urine culture grew Streptococcus viridans.  Symptoms improved after taking Keflex  until 2 days ago and they returned.  Denies recent intake of urinary irritants.  She does not take an SGLT2 inhibitor.  She has taken Pyridium  prior to arrival with some relief of symptoms.     Past Medical History:  Diagnosis Date   Arthritis    Benign neoplasm of colon    Constipation    Diaphragmatic hernia without mention of obstruction or gangrene    Diverticulosis of colon (without mention of hemorrhage)    Dysphagia, pharyngoesophageal phase    Esophageal reflux    Essential hypertension    Gastritis    Heart murmur    Hemorrhoids    Hyperlipidemia    Internal hemorrhoids without mention of complication    Interstitial cystitis    Left wrist fracture    PONV (postoperative nausea and vomiting)    PSVT (paroxysmal supraventricular tachycardia) (HCC)    Stricture and stenosis of esophagus    Type 2 diabetes mellitus (HCC)    Urinary frequency 08/04/2023    Patient Active Problem List   Diagnosis Date Noted   Urge incontinence 08/04/2023   Upper airway cough syndrome 12/26/2022    Interstitial cystitis 10/24/2022   History of recurrent UTIs 10/24/2022   Bilateral renal cysts 10/24/2022   Osteoporosis 07/05/2022   Multiple pulmonary nodules determined by computed tomography of lung 03/21/2022   Nephrolithiasis 03/15/2021   Prolapsed internal hemorrhoids, grade 3 01/23/2021   Spinal stenosis of lumbar region 12/02/2017   Osteoarthritis of left knee 12/02/2017   Degeneration of lumbar intervertebral disc 11/18/2017   IBS (irritable bowel syndrome) 01/30/2015   Type 2 diabetes mellitus (HCC) 12/22/2014   Hyperlipidemia, unspecified 12/22/2014   Essential hypertension 11/25/2013   Chronic pancreatitis (HCC) 02/26/2011   Gastroesophageal reflux disease 01/18/2011    Past Surgical History:  Procedure Laterality Date   ABDOMINAL HYSTERECTOMY     BACTERIAL OVERGROWTH TEST N/A 09/06/2015   Procedure: BACTERIAL OVERGROWTH TEST;  Surgeon: Lamar CHRISTELLA Hollingshead, MD;  Location: [facility];  Service: Endoscopy;  Laterality: N/A;  800   BIOPSY  01/02/2022   Procedure: BIOPSY;  Surgeon: Hollingshead Lamar CHRISTELLA, MD;  Location: [facility];  Service: Endoscopy;;   BIOPSY  11/18/2022   Procedure: BIOPSY;  Surgeon: Hollingshead Lamar CHRISTELLA, MD;  Location: [facility];  Service: Endoscopy;;   BREAST LUMPECTOMY Right    benign   CATARACT EXTRACTION Bilateral 2006   Implants in both, surgeries done 6 weeks apart   CHOLECYSTECTOMY     COLONOSCOPY  03/08/02   Jakie: diverticulosis, internal hemorrhoids, adenomatous colon polyp   COLONOSCOPY  01/23/04  Patterson: diverticulosis, internal hemorrhoids   COLONOSCOPY  09/25/05   Jakie: diverticulosis   COLONOSCOPY  07/05/09   Jakie: severe diverticulosis   COLONOSCOPY  01/21/11   Jakie: severe diverticulosis in sigmoid to desc colon, int hemorrhoids, follow up TCS in 5 years   COLONOSCOPY N/A 04/10/2016   [name]: diverticulosis   CYSTOSCOPY W/ URETERAL STENT PLACEMENT Right 03/15/2021   Procedure: CYSTOSCOPY WITH RETROGRADE  PYELOGRAM/URETERAL STENT PLACEMENT;  Surgeon: Selma Donnice SAUNDERS, MD;  Location: [facility];  Service: Urology;  Laterality: Right;   CYSTOSCOPY WITH RETROGRADE PYELOGRAM, URETEROSCOPY AND STENT PLACEMENT Right 03/29/2021   Procedure: CYSTOSCOPY WITH RETROGRADE PYELOGRAM, URETEROSCOPY AND STENT EXCHANGE;  Surgeon: Sherrilee Belvie CROME, MD;  Location: [facility];  Service: Urology;  Laterality: Right;   DILATION AND CURETTAGE OF UTERUS     ESOPHAGEAL MANOMETRY  03/21/08   Patterson: findings c/w [place] Esophagus   ESOPHAGOGASTRODUODENOSCOPY  03/08/02   Jakie: esophageal stricture, chronic gerd, s/p dilation.    ESOPHAGOGASTRODUODENOSCOPY  01/23/04   Jakie: esophageal stricture, gastritis, hiatal hernia   ESOPHAGOGASTRODUODENOSCOPY  09/25/05   Jakie: gastritis, benign bx, no H.pylori   ESOPHAGOGASTRODUODENOSCOPY  07/05/09   Jakie: gastropathy, benign small bowel bx and gastric bx   ESOPHAGOGASTRODUODENOSCOPY N/A 05/15/2015   Dr. Shaaron- diffuse moderate inflammation characterized by congestion (edema), erythema, and linear erosions was found in the entire examined stomach. bx= reactive gastropathy   ESOPHAGOGASTRODUODENOSCOPY (EGD) WITH PROPOFOL  N/A 01/02/2022   Procedure: ESOPHAGOGASTRODUODENOSCOPY (EGD) WITH PROPOFOL ;  Surgeon: Shaaron Lamar HERO, MD;  Location: [facility];  Service: Endoscopy;  Laterality: N/A;  10:30am, asa 2   ESOPHAGOGASTRODUODENOSCOPY (EGD) WITH PROPOFOL  N/A 04/23/2023   Procedure: ESOPHAGOGASTRODUODENOSCOPY (EGD) WITH PROPOFOL ;  Surgeon: Shaaron Lamar HERO, MD;  Location: [facility];  Service: Endoscopy;  Laterality: N/A;  1130AM, ASA 3   FLEXIBLE SIGMOIDOSCOPY N/A 11/18/2022   Procedure: FLEXIBLE SIGMOIDOSCOPY;  Surgeon: Shaaron Lamar HERO, MD;  Location: [facility];  Service: Endoscopy;  Laterality: N/A;  10:00 am, asa 2   FOOT SURGERY Left    HEMORRHOID BANDING  2017   Dr.[name]   HOLMIUM LASER APPLICATION Right 03/29/2021   Procedure: HOLMIUM LASER APPLICATION;  Surgeon:  Sherrilee Belvie CROME, MD;  Location: [facility];  Service: Urology;  Laterality: Right;   LAPAROSCOPIC VAGINAL HYSTERECTOMY     MALONEY DILATION N/A 01/02/2022   Procedure: AGAPITO DILATION;  Surgeon: Shaaron Lamar HERO, MD;  Location: [facility];  Service: Endoscopy;  Laterality: N/A;   MALONEY DILATION N/A 04/23/2023   Procedure: DILATION, ESOPHAGUS, USING MALONEY DILATOR;  Surgeon: Shaaron Lamar HERO, MD;  Location: [facility];  Service: Endoscopy;  Laterality: N/A;   ORIF WRIST FRACTURE Left 06/23/2018   Procedure: OPEN REDUCTION INTERNAL FIXATION (ORIF) LEFT WRIST FRACTURE;  Surgeon: Beverley Evalene BIRCH, MD;  Location: [facility];  Service: Orthopedics;  Laterality: Left;  regional arm block   RECTOCELE REPAIR     TOTAL KNEE ARTHROPLASTY Right     OB History     Gravida  4   Para  3   Term  3   Preterm      AB  1   Living  3      SAB  1   IAB      Ectopic      Multiple      Live Births               Home Medications    Prior to Admission medications  Medication Sig Start Date End Date Taking? Authorizing Provider  cephALEXin  (KEFLEX ) 500 MG capsule Take 1 capsule (500 mg total) by mouth 2 (two) times daily for 7 days. 08/09/23 08/16/23 Yes StanhopeDorna HERO, FNP  acetaminophen  (TYLENOL ) 500 MG tablet Take 500 mg by mouth at bedtime.    [provider]  alfuzosin  (UROXATRAL ) 10 MG 24 hr tablet Take 1 tablet (10 mg total) by mouth at bedtime. 07/16/23   McKenzie, Belvie CROME, MD  ascorbic acid (VITAMIN C) 500 MG tablet Take 500 mg by mouth daily.    [provider]  aspirin  EC 81 MG tablet Take 81 mg by mouth at bedtime.    [provider]  Carboxymethylcellulose Sodium (THERATEARS) 0.25 % SOLN Place 1 drop into both eyes 2 (two) times daily.    [provider]  cholecalciferol (VITAMIN D3) 25 MCG (1000 UNIT) tablet Take 1,000 Units by mouth at bedtime.    [provider]  clidinium-chlordiazePOXIDE  (LIBRAX)  5-2.5 MG capsule Take 1 capsule by mouth 3 (three) times daily as needed (for esophageal symptoms). 10/22/22   Ezzard Sonny RAMAN, PA-C  ezetimibe  (ZETIA ) 10 MG tablet TAKE 1 TABLET BY MOUTH EVERY DAY 06/13/23   Debera Jayson MATSU, MD  famotidine  (PEPCID ) 20 MG tablet TAKE 1 TABLET BY MOUTH DAILY WITH SUPPER. 06/16/23   Ezzard Sonny RAMAN, PA-C  Guaifenesin 1200 MG TB12 Take 1,200 mg by mouth daily with supper.    [provider]  hyoscyamine  (LEVSIN  SL) 0.125 MG SL tablet USE 1 TABLET UNDER THE TONGUE BEFORE MEALS AND AT BEDTIME AS NEEDED FOR FLARES IN SYMPTOMS, DIARRHEA/ABDOMINAL CRAMPING 07/17/22   Shirlean Therisa ORN, NP  ipratropium (ATROVENT ) 0.03 % nasal spray Place 2 sprays into both nostrils every 12 (twelve) hours. 05/08/23   Soldatova, Liuba, MD  losartan  (COZAAR ) 50 MG tablet Take 1 tablet (50 mg total) by mouth daily. 08/06/23   Cook, Jayce G, DO  meclizine  (ANTIVERT ) 25 MG tablet Take 1 tablet (25 mg total) by mouth 3 (three) times daily as needed. 01/27/23   Cook, Jayce G, DO  metFORMIN  (GLUCOPHAGE ) 500 MG tablet TAKE ONE TABLET BY MOUTH EVERY MORNING ONE AT LUNCH AND 2 AT BEDTIME 03/21/23   Cook, Jayce G, DO  methenamine  (HIPREX ) 1 g tablet TAKE 1 TABLET BY MOUTH TWICE A DAY 09/17/22   McKenzie, Belvie CROME, MD  mirabegron  ER (MYRBETRIQ ) 50 MG TB24 tablet Take 1 tablet (50 mg total) by mouth daily. 08/04/23   Larocco, Sarah C, FNP  mometasone  (NASONEX ) 50 MCG/ACT nasal spray Place 2 sprays into the nose daily. 05/08/23   Soldatova, Liuba, MD  Multiple Vitamins-Minerals (PRESERVISION AREDS 2) CAPS Take 1 capsule by mouth 2 (two) times daily.     [provider]  nateglinide  (STARLIX ) 60 MG tablet Take 1 tablet (60 mg total) by mouth 3 (three) times daily with meals. 01/06/23   Cook, Jayce G, DO  omega-3 acid ethyl esters (LOVAZA) 1 g capsule Take 2 g by mouth every morning.    [provider]  Pancrelipase , Lip-Prot-Amyl, (CREON ) 24000-76000 units CPEP TAKE 3 CAPSULES THREE TIMES DAILY  WITH MEALS AND ONE CAPSULE WITH SNACK TWICE DAILY 06/25/23   Ezzard Sonny RAMAN, PA-C  RABEprazole  (ACIPHEX ) 20 MG tablet TAKE 1 TABLET BY MOUTH EVERY DAY 12/16/22   Ezzard Sonny RAMAN, PA-C    Family History Family History  Problem Relation Age of Onset   Ovarian cancer Mother    Diabetes Father    Heart  disease Father    Breast cancer Sister    Liver cancer Sister    Colon cancer Cousin        Paternal side   Diabetes Maternal Aunt    Liver disease Cousin        Maternal side, never drank    Social History Social History   Tobacco Use   Smoking status: Never   Smokeless tobacco: Never   Tobacco comments:    Never smoked  Vaping Use   Vaping status: Never Used  Substance Use Topics   Alcohol use: No    Alcohol/week: 0.0 standard drinks of alcohol   Drug use: No     Allergies   Adhesive [tape], Estrogens, Other, and Sulfonamide derivatives   Review of Systems Review of Systems Per HPI  Physical Exam Triage Vital Signs ED Triage Vitals  Encounter Vitals Group     BP 08/09/23 1313 136/79     Girls Systolic BP Percentile --      Girls Diastolic BP Percentile --      Boys Systolic BP Percentile --      Boys Diastolic BP Percentile --      Pulse Rate 08/09/23 1313 82     Resp 08/09/23 1313 18     Temp 08/09/23 1313 98 F (36.7 C)     Temp Source 08/09/23 1313 Oral     SpO2 08/09/23 1313 98 %     Weight --      Height --      Head Circumference --      Peak Flow --      Pain Score 08/09/23 1312 4     Pain Loc --      Pain Education --      Exclude from Growth Chart --    No data found.  Updated Vital Signs BP 136/79 (BP Location: Right Arm)   Pulse 82   Temp 98 F (36.7 C) (Oral)   Resp 18   SpO2 98%   Visual Acuity Right Eye Distance:   Left Eye Distance:   Bilateral Distance:    Right Eye Near:   Left Eye Near:    Bilateral Near:     Physical Exam Vitals and nursing note reviewed.  Constitutional:      Appearance: She is not ill-appearing  or toxic-appearing.  HENT:     Head: Normocephalic and atraumatic.     Right Ear: Hearing and external ear normal.     Left Ear: Hearing and external ear normal.     Nose: Nose normal.     Mouth/Throat:     Lips: Pink.     Mouth: Mucous membranes are moist.   Eyes:     General: Lids are normal. Vision grossly intact. Gaze aligned appropriately.     Extraocular Movements: Extraocular movements intact.     Conjunctiva/sclera: Conjunctivae normal.    Cardiovascular:     Rate and Rhythm: Normal rate and regular rhythm.     Heart sounds: Normal heart sounds, S1 normal and S2 normal.  Pulmonary:     Effort: Pulmonary effort is normal. No respiratory distress.     Breath sounds: Normal breath sounds and air entry.  Abdominal:     General: Bowel sounds are normal.     Palpations: Abdomen is soft.     Tenderness: There is no abdominal tenderness. There is no right CVA tenderness, left CVA tenderness or guarding.   Musculoskeletal:     Cervical back: Neck  supple.   Skin:    General: Skin is warm and dry.     Capillary Refill: Capillary refill takes less than 2 seconds.     Findings: No rash.   Neurological:     General: No focal deficit present.     Mental Status: She is alert and oriented to person, place, and time. Mental status is at baseline.     Cranial Nerves: No dysarthria or facial asymmetry.   Psychiatric:        Mood and Affect: Mood normal.        Speech: Speech normal.        Behavior: Behavior normal.        Thought Content: Thought content normal.        Judgment: Judgment normal.      UC Treatments / Results  Labs (all labs ordered are listed, but only abnormal results are displayed) Labs Reviewed  POCT URINALYSIS DIP (MANUAL ENTRY) - Abnormal; Notable for the following components:      Result Value   Color, UA straw (*)    Clarity, UA turbid (*)    Blood, UA moderate (*)    Protein Ur, POC =100 (*)    Nitrite, UA Positive (*)    Leukocytes, UA Large  (3+) (*)    All other components within normal limits  URINE CULTURE    EKG   Radiology No results found.  Procedures Procedures (including critical care time)  Medications Ordered in UC Medications - No data to display  Initial Impression / Assessment and Plan / UC Course  I have reviewed the triage vital signs and the nursing notes.  Pertinent labs & imaging results that were available during my care of the patient were reviewed by me and considered in my medical decision making (see chart for details).   1.  Acute cystitis with hematuria, recurrent UTI Evaluation suggests acute cystitis based on presentation and urinalysis findings in clinic.  Urine culture pending.  Low suspicion for acute pyelonephritis, kidney stone or infected stone.  Keflex  antibiotic ordered.  Reviewed recent blood work showing intact renal function. Appears well hydrated, therefore will defer labs/imaging.  Vitals stable.  Patient to push fluids to stay well hydrated and reduce intake of known urinary irritants.  Recommend follow-up with urologist for a visit and an evaluation to discuss preventing recurrent UTIs.  Counseled patient on potential for adverse effects with medications prescribed/recommended today, strict ER and return-to-clinic precautions discussed, patient verbalized understanding.    Final Clinical Impressions(s) / UC Diagnoses   Final diagnoses:  Acute cystitis with hematuria  Recurrent UTI     Discharge Instructions      I suspect that you have an acute urinary tract infection.  Please take Keflex  antibiotic twice daily for the next 7 days.  Follow-up with your urologist as scheduled for your next interstitial cystitis treatment.  I would like for you to schedule an appointment with the actual urology provider to discuss recurrent UTIs after your kidney stones.  Your urine culture is pending and staff will call if urine culture shows need for change in treatment plan  in the next 2 to 3 days.  If you develop any new or worsening symptoms or if your symptoms do not start to improve, please return here or follow-up with your primary care provider. If your symptoms are severe, please go to the emergency room.      ED Prescriptions     Medication Sig Dispense Auth. Provider  cephALEXin  (KEFLEX ) 500 MG capsule Take 1 capsule (500 mg total) by mouth 2 (two) times daily for 7 days. 14 capsule Enedelia Dorna HERO, FNP      PDMP not reviewed this encounter.   Enedelia Dorna HERO, [place] 08/09/23 1426

## 2023-08-09 NOTE — Discharge Instructions (Addendum)
 I suspect that you have an acute urinary tract infection.  Please take Keflex  antibiotic twice daily for the next 7 days.  Follow-up with your urologist as scheduled for your next interstitial cystitis treatment.  I would like for you to schedule an appointment with the actual urology provider to discuss recurrent UTIs after your kidney stones.  Your urine culture is pending and staff will call if urine culture shows need for change in treatment plan in the next 2 to 3 days.  If you develop any new or worsening symptoms or if your symptoms do not start to improve, please return here or follow-up with your primary care provider. If your symptoms are severe, please go to the emergency room.

## 2023-08-09 NOTE — ED Triage Notes (Signed)
 Pt reports she has bladder spasms, burning with urination, low abdominal pain,  cannot void properly, frequent urination, and urgency x 1 week.

## 2023-08-11 ENCOUNTER — Ambulatory Visit (HOSPITAL_COMMUNITY): Payer: Self-pay

## 2023-08-11 LAB — URINE CULTURE: Culture: >=100000 — AB

## 2023-08-11 NOTE — Telephone Encounter (Signed)
 Patient was made aware Yes thanks patient state's she went to urgent care and was prescribe Keflex  for infection. PT is awaiting urine culture results.

## 2023-08-13 ENCOUNTER — Ambulatory Visit

## 2023-08-13 ENCOUNTER — Telehealth: Payer: Self-pay

## 2023-08-13 DIAGNOSIS — N3010 Interstitial cystitis (chronic) without hematuria: Secondary | ICD-10-CM | POA: Diagnosis not present

## 2023-08-13 MED ORDER — DIMETHYL SULFOXIDE 50 % IS SOLN
50.0000 mL | Freq: Once | INTRAVESICAL | Status: AC
  Administered 2023-08-13: 50 mL via URETHRAL

## 2023-08-13 MED ORDER — HYDROCORTISONE SOD SUC (PF) 100 MG IJ SOLR
100.0000 mg | Freq: Once | INTRAMUSCULAR | Status: AC
  Administered 2023-08-13: 100 mg

## 2023-08-13 MED ORDER — SODIUM BICARBONATE 8.4 % IV SOLN
50.0000 meq | Freq: Once | INTRAVENOUS | Status: AC
  Administered 2023-08-13: 50 meq

## 2023-08-13 MED ORDER — LIDOCAINE HCL 2 % IJ SOLN
10.0000 mL | Freq: Once | INTRAMUSCULAR | Status: AC
  Administered 2023-08-13: 200 mg

## 2023-08-13 NOTE — Telephone Encounter (Signed)
 Patient stated after her DMSO treatment the previous treatment did not help her intersitial cystisis pain and she is worried that this treatment will not help  either pt wanted to know if there was anything else she could take for the pain pt advised that a message would be sent to provider

## 2023-08-13 NOTE — Progress Notes (Cosign Needed Addendum)
 Bladder Instillation DMSO  Due to Interstitial Cystitis patient is present today for a Bladder Instillation of DMSO treatment. Patient was cleaned and prepped in a sterile fashion with Betadinex3.  A 20FR catheter was inserted, urine return was noted , urine was Amber In color.  DMSO treatment was instilled into the bladder. The catheter was then removed after 20 minutes. was drained from patient bladder thorough catheter. 10mL was drained from balloon catheter removed intact. Patient tolerated well, no complications were noted.  Preformed by: Exie DASEN. CMA  Follow up/ Additional notes: as scheduled

## 2023-08-13 NOTE — Addendum Note (Signed)
 Addended by: SAMMIE EXIE HERO on: 08/13/2023 04:24 PM   Modules accepted: Level of Service

## 2023-08-14 LAB — URINALYSIS, ROUTINE W REFLEX MICROSCOPIC
Bilirubin, UA: NEGATIVE
Glucose, UA: NEGATIVE
Ketones, UA: NEGATIVE
Nitrite, UA: NEGATIVE
Protein,UA: NEGATIVE
RBC, UA: NEGATIVE
Specific Gravity, UA: 1.02 (ref 1.005–1.030)
Urobilinogen, Ur: 0.2 mg/dL (ref 0.2–1.0)
pH, UA: 6 (ref 5.0–7.5)

## 2023-08-14 LAB — MICROSCOPIC EXAMINATION: WBC, UA: >30 /HPF — AB (ref 0–5)

## 2023-08-22 ENCOUNTER — Encounter: Payer: Self-pay | Admitting: Emergency Medicine

## 2023-08-22 ENCOUNTER — Other Ambulatory Visit: Payer: Self-pay

## 2023-08-22 ENCOUNTER — Ambulatory Visit
Admission: EM | Admit: 2023-08-22 | Discharge: 2023-08-22 | Disposition: A | Discharge status: Home / Self Care | Source: Ambulatory Visit | Attending: Nurse Practitioner | Admitting: Nurse Practitioner

## 2023-08-22 DIAGNOSIS — N3001 Acute cystitis with hematuria: Secondary | ICD-10-CM | POA: Insufficient documentation

## 2023-08-22 DIAGNOSIS — N3000 Acute cystitis without hematuria: Secondary | ICD-10-CM | POA: Diagnosis not present

## 2023-08-22 LAB — POCT URINALYSIS DIP (MANUAL ENTRY)
Bilirubin, UA: NEGATIVE
Glucose, UA: NEGATIVE mg/dL
Ketones, POC UA: NEGATIVE mg/dL
Nitrite, UA: POSITIVE — AB
Protein Ur, POC: NEGATIVE mg/dL
Spec Grav, UA: 1.015 (ref 1.010–1.025)
Urobilinogen, UA: 0.2 U/dL
pH, UA: 6 (ref 5.0–8.0)

## 2023-08-22 MED ORDER — CEFUROXIME AXETIL 500 MG PO TABS: 500.0000 mg | ORAL_TABLET | Freq: Two times a day (BID) | ORAL | 0 refills | Status: AC

## 2023-08-22 MED ORDER — CEFTRIAXONE SODIUM 1 G IJ SOLR
1.0000 g | Freq: Once | INTRAMUSCULAR | Status: AC
  Administered 2023-08-22: 1 g via INTRAMUSCULAR

## 2023-08-22 NOTE — ED Triage Notes (Addendum)
 Pt reports urinary frequency and bladder spasms since last night. Pt reports history of similar a few weeks ago. Pt reports finished those abx on Sunday.

## 2023-08-22 NOTE — ED Provider Notes (Addendum)
 [facility]    CSN: [id number] Arrival date & time: 08/22/23  1705      History   Chief Complaint Chief Complaint  Patient presents with   Dysuria    HPI Deborah Gray is a 89 y.o. female.   What scented  The history is provided by the patient.   Patient presents for complaints of bladder pressure, urinary frequency, and urinary urgency that started over the past 24 hours.  Patient states she was treated for UTI approximately 2 weeks ago then subsequently had treatment for her interstitial cystitis.  She states while she was having the bladder treatment, the treatment did not feel the same as it usually does states that it was more painful for her, states that she notified the technologist, and the technologist advised I was in the wrong spot..  Patient states that she finished Keflex  this past week.  She is concerned that she may have an acute recurrent urinary tract infection. Denies dysuria, nausea, vomiting, diarrhea, flank pain, low back pain, fever, chills, confusion, dizziness, and headaches.  Past Medical History:  Diagnosis Date   Arthritis    Benign neoplasm of colon    Constipation    Diaphragmatic hernia without mention of obstruction or gangrene    Diverticulosis of colon (without mention of hemorrhage)    Dysphagia, pharyngoesophageal phase    Esophageal reflux    Essential hypertension    Gastritis    Heart murmur    Hemorrhoids    Hyperlipidemia    Internal hemorrhoids without mention of complication    Interstitial cystitis    Left wrist fracture    PONV (postoperative nausea and vomiting)    PSVT (paroxysmal supraventricular tachycardia) (HCC)    Stricture and stenosis of esophagus    Type 2 diabetes mellitus (HCC)    Urinary frequency 08/04/2023    Patient Active Problem List   Diagnosis Date Noted   Urge incontinence 08/04/2023   Upper airway cough syndrome 12/26/2022   Interstitial cystitis 10/24/2022   History of recurrent  UTIs 10/24/2022   Bilateral renal cysts 10/24/2022   Osteoporosis 07/05/2022   Multiple pulmonary nodules determined by computed tomography of lung 03/21/2022   Nephrolithiasis 03/15/2021   Prolapsed internal hemorrhoids, grade 3 01/23/2021   Spinal stenosis of lumbar region 12/02/2017   Osteoarthritis of left knee 12/02/2017   Degeneration of lumbar intervertebral disc 11/18/2017   IBS (irritable bowel syndrome) 01/30/2015   Type 2 diabetes mellitus (HCC) 12/22/2014   Hyperlipidemia, unspecified 12/22/2014   Essential hypertension 11/25/2013   Chronic pancreatitis (HCC) 02/26/2011   Gastroesophageal reflux disease 01/18/2011    Past Surgical History:  Procedure Laterality Date   ABDOMINAL HYSTERECTOMY     BACTERIAL OVERGROWTH TEST N/A 09/06/2015   Procedure: BACTERIAL OVERGROWTH TEST;  Surgeon: Lamar CHRISTELLA Hollingshead, MD;  Location: [facility];  Service: Endoscopy;  Laterality: N/A;  800   BIOPSY  01/02/2022   Procedure: BIOPSY;  Surgeon: Hollingshead Lamar CHRISTELLA, MD;  Location: [facility];  Service: Endoscopy;;   BIOPSY  11/18/2022   Procedure: BIOPSY;  Surgeon: Hollingshead Lamar CHRISTELLA, MD;  Location: [facility];  Service: Endoscopy;;   BREAST LUMPECTOMY Right    benign   CATARACT EXTRACTION Bilateral 2006   Implants in both, surgeries done 6 weeks apart   CHOLECYSTECTOMY     COLONOSCOPY  03/08/02   Jakie: diverticulosis, internal hemorrhoids, adenomatous colon polyp   COLONOSCOPY  01/23/04   Jakie: diverticulosis, internal hemorrhoids   COLONOSCOPY  09/25/05   Patterson: diverticulosis   COLONOSCOPY  07/05/09   Jakie: severe diverticulosis   COLONOSCOPY  01/21/11   Jakie: severe diverticulosis in sigmoid to desc colon, int hemorrhoids, follow up TCS in 5 years   COLONOSCOPY N/A 04/10/2016   [name]: diverticulosis   CYSTOSCOPY W/ URETERAL STENT PLACEMENT Right 03/15/2021   Procedure: CYSTOSCOPY WITH RETROGRADE PYELOGRAM/URETERAL STENT PLACEMENT;  Surgeon: Selma Donnice SAUNDERS, MD;   Location: [facility];  Service: Urology;  Laterality: Right;   CYSTOSCOPY WITH RETROGRADE PYELOGRAM, URETEROSCOPY AND STENT PLACEMENT Right 03/29/2021   Procedure: CYSTOSCOPY WITH RETROGRADE PYELOGRAM, URETEROSCOPY AND STENT EXCHANGE;  Surgeon: Sherrilee Belvie CROME, MD;  Location: [facility];  Service: Urology;  Laterality: Right;   DILATION AND CURETTAGE OF UTERUS     ESOPHAGEAL MANOMETRY  03/21/08   Patterson: findings c/w [place] Esophagus   ESOPHAGOGASTRODUODENOSCOPY  03/08/02   Jakie: esophageal stricture, chronic gerd, s/p dilation.    ESOPHAGOGASTRODUODENOSCOPY  01/23/04   Jakie: esophageal stricture, gastritis, hiatal hernia   ESOPHAGOGASTRODUODENOSCOPY  09/25/05   Jakie: gastritis, benign bx, no H.pylori   ESOPHAGOGASTRODUODENOSCOPY  07/05/09   Jakie: gastropathy, benign small bowel bx and gastric bx   ESOPHAGOGASTRODUODENOSCOPY N/A 05/15/2015   Dr. Shaaron- diffuse moderate inflammation characterized by congestion (edema), erythema, and linear erosions was found in the entire examined stomach. bx= reactive gastropathy   ESOPHAGOGASTRODUODENOSCOPY (EGD) WITH PROPOFOL  N/A 01/02/2022   Procedure: ESOPHAGOGASTRODUODENOSCOPY (EGD) WITH PROPOFOL ;  Surgeon: Shaaron Lamar HERO, MD;  Location: [facility];  Service: Endoscopy;  Laterality: N/A;  10:30am, asa 2   ESOPHAGOGASTRODUODENOSCOPY (EGD) WITH PROPOFOL  N/A 04/23/2023   Procedure: ESOPHAGOGASTRODUODENOSCOPY (EGD) WITH PROPOFOL ;  Surgeon: Shaaron Lamar HERO, MD;  Location: [facility];  Service: Endoscopy;  Laterality: N/A;  1130AM, ASA 3   FLEXIBLE SIGMOIDOSCOPY N/A 11/18/2022   Procedure: FLEXIBLE SIGMOIDOSCOPY;  Surgeon: Shaaron Lamar HERO, MD;  Location: [facility];  Service: Endoscopy;  Laterality: N/A;  10:00 am, asa 2   FOOT SURGERY Left    HEMORRHOID BANDING  2017   Dr.[name]   HOLMIUM LASER APPLICATION Right 03/29/2021   Procedure: HOLMIUM LASER APPLICATION;  Surgeon: Sherrilee Belvie CROME, MD;  Location: [facility];  Service: Urology;   Laterality: Right;   LAPAROSCOPIC VAGINAL HYSTERECTOMY     MALONEY DILATION N/A 01/02/2022   Procedure: AGAPITO DILATION;  Surgeon: Shaaron Lamar HERO, MD;  Location: [facility];  Service: Endoscopy;  Laterality: N/A;   MALONEY DILATION N/A 04/23/2023   Procedure: DILATION, ESOPHAGUS, USING MALONEY DILATOR;  Surgeon: Shaaron Lamar HERO, MD;  Location: [facility];  Service: Endoscopy;  Laterality: N/A;   ORIF WRIST FRACTURE Left 06/23/2018   Procedure: OPEN REDUCTION INTERNAL FIXATION (ORIF) LEFT WRIST FRACTURE;  Surgeon: Beverley Evalene BIRCH, MD;  Location: [facility];  Service: Orthopedics;  Laterality: Left;  regional arm block   RECTOCELE REPAIR     TOTAL KNEE ARTHROPLASTY Right     OB History     Gravida  4   Para  3   Term  3   Preterm      AB  1   Living  3      SAB  1   IAB      Ectopic      Multiple      Live Births               Home Medications    Prior to Admission medications   Medication Sig Start Date End Date Taking? Authorizing  Provider  acetaminophen  (TYLENOL ) 500 MG tablet Take 500 mg by mouth at bedtime.    [provider]  alfuzosin  (UROXATRAL ) 10 MG 24 hr tablet Take 1 tablet (10 mg total) by mouth at bedtime. 07/16/23   McKenzie, Belvie CROME, MD  ascorbic acid (VITAMIN C) 500 MG tablet Take 500 mg by mouth daily.    [provider]  aspirin  EC 81 MG tablet Take 81 mg by mouth at bedtime.    [provider]  Carboxymethylcellulose Sodium (THERATEARS) 0.25 % SOLN Place 1 drop into both eyes 2 (two) times daily.    [provider]  cholecalciferol (VITAMIN D3) 25 MCG (1000 UNIT) tablet Take 1,000 Units by mouth at bedtime.    [provider]  clidinium-chlordiazePOXIDE  (LIBRAX) 5-2.5 MG capsule Take 1 capsule by mouth 3 (three) times daily as needed (for esophageal symptoms). 10/22/22   Ezzard Sonny RAMAN, PA-C  ezetimibe  (ZETIA ) 10 MG tablet TAKE 1 TABLET BY MOUTH EVERY DAY 06/13/23   Debera Jayson MATSU, MD  famotidine  (PEPCID ) 20 MG tablet TAKE 1 TABLET BY MOUTH DAILY WITH SUPPER. 06/16/23   Ezzard Sonny RAMAN, PA-C  Guaifenesin 1200 MG TB12 Take 1,200 mg by mouth daily with supper.    [provider]  hyoscyamine  (LEVSIN  SL) 0.125 MG SL tablet USE 1 TABLET UNDER THE TONGUE BEFORE MEALS AND AT BEDTIME AS NEEDED FOR FLARES IN SYMPTOMS, DIARRHEA/ABDOMINAL CRAMPING 07/17/22   Shirlean Therisa ORN, NP  ipratropium (ATROVENT ) 0.03 % nasal spray Place 2 sprays into both nostrils every 12 (twelve) hours. 05/08/23   Soldatova, Liuba, MD  losartan  (COZAAR ) 50 MG tablet Take 1 tablet (50 mg total) by mouth daily. 08/06/23   Cook, Jayce G, DO  meclizine  (ANTIVERT ) 25 MG tablet Take 1 tablet (25 mg total) by mouth 3 (three) times daily as needed. 01/27/23   Cook, Jayce G, DO  metFORMIN  (GLUCOPHAGE ) 500 MG tablet TAKE ONE TABLET BY MOUTH EVERY MORNING ONE AT LUNCH AND 2 AT BEDTIME 03/21/23   Cook, Jayce G, DO  methenamine  (HIPREX ) 1 g tablet TAKE 1 TABLET BY MOUTH TWICE A DAY 09/17/22   McKenzie, Belvie CROME, MD  mirabegron  ER (MYRBETRIQ ) 50 MG TB24 tablet Take 1 tablet (50 mg total) by mouth daily. 08/04/23   Larocco, Sarah C, FNP  mometasone  (NASONEX ) 50 MCG/ACT nasal spray Place 2 sprays into the nose daily. 05/08/23   Soldatova, Liuba, MD  Multiple Vitamins-Minerals (PRESERVISION AREDS 2) CAPS Take 1 capsule by mouth 2 (two) times daily.     [provider]  nateglinide  (STARLIX ) 60 MG tablet Take 1 tablet (60 mg total) by mouth 3 (three) times daily with meals. 01/06/23   Cook, Jayce G, DO  omega-3 acid ethyl esters (LOVAZA) 1 g capsule Take 2 g by mouth every morning.    [provider]  Pancrelipase , Lip-Prot-Amyl, (CREON ) 24000-76000 units CPEP TAKE 3 CAPSULES THREE TIMES DAILY WITH MEALS AND ONE CAPSULE WITH SNACK TWICE DAILY 06/25/23   Ezzard Sonny RAMAN, PA-C  RABEprazole  (ACIPHEX ) 20 MG tablet TAKE 1 TABLET BY MOUTH EVERY DAY 12/16/22   Ezzard Sonny RAMAN, PA-C    Family History Family  History  Problem Relation Age of Onset   Ovarian cancer Mother    Diabetes Father    Heart disease Father    Breast cancer Sister    Liver cancer Sister    Colon cancer Cousin        Paternal side   Diabetes Maternal Aunt  Liver disease Cousin        Maternal side, never drank    Social History Social History   Tobacco Use   Smoking status: Never   Smokeless tobacco: Never   Tobacco comments:    Never smoked  Vaping Use   Vaping status: Never Used  Substance Use Topics   Alcohol use: No    Alcohol/week: 0.0 standard drinks of alcohol   Drug use: No     Allergies   Adhesive [tape], Estrogens, Other, and Sulfonamide derivatives   Review of Systems Review of Systems Per HPI  Physical Exam Triage Vital Signs ED Triage Vitals  Encounter Vitals Group     BP 08/22/23 1742 (!) 142/84     Girls Systolic BP Percentile --      Girls Diastolic BP Percentile --      Boys Systolic BP Percentile --      Boys Diastolic BP Percentile --      Pulse Rate 08/22/23 1742 81     Resp 08/22/23 1742 20     Temp 08/22/23 1742 98.3 F (36.8 C)     Temp Source 08/22/23 1742 Oral     SpO2 08/22/23 1742 95 %     Weight --      Height --      Head Circumference --      Peak Flow --      Pain Score 08/22/23 1740 2     Pain Loc --      Pain Education --      Exclude from Growth Chart --    No data found.  Updated Vital Signs BP (!) 142/84 (BP Location: Right Arm)   Pulse 81   Temp 98.3 F (36.8 C) (Oral)   Resp 20   SpO2 95%   Visual Acuity Right Eye Distance:   Left Eye Distance:   Bilateral Distance:    Right Eye Near:   Left Eye Near:    Bilateral Near:     Physical Exam Vitals and nursing note reviewed.  Constitutional:      General: She is not in acute distress.    Appearance: Normal appearance.  HENT:     Head: Normocephalic.  Eyes:     Extraocular Movements: Extraocular movements intact.     Pupils: Pupils are equal, round, and reactive to light.   Cardiovascular:     Rate and Rhythm: Normal rate and regular rhythm.     Pulses: Normal pulses.     Heart sounds: Normal heart sounds.  Pulmonary:     Effort: Pulmonary effort is normal. No respiratory distress.     Breath sounds: Normal breath sounds. No stridor. No wheezing, rhonchi or rales.  Abdominal:     General: Bowel sounds are normal.     Palpations: Abdomen is soft.     Tenderness: There is abdominal tenderness in the suprapubic area. There is no right CVA tenderness or left CVA tenderness.  Skin:    General: Skin is warm and dry.  Neurological:     General: No focal deficit present.     Mental Status: She is alert and oriented to person, place, and time.  Psychiatric:        Mood and Affect: Mood normal.        Behavior: Behavior normal.      UC Treatments / Results  Labs (all labs ordered are listed, but only abnormal results are displayed) Labs Reviewed  POCT URINALYSIS DIP (MANUAL ENTRY) -  Abnormal; Notable for the following components:      Result Value   Clarity, UA cloudy (*)    Blood, UA small (*)    Nitrite, UA Positive (*)    Leukocytes, UA Large (3+) (*)    All other components within normal limits  URINE CULTURE    EKG   Radiology No results found.  Procedures Procedures (including critical care time)  Medications Ordered in UC Medications  cefTRIAXone  (ROCEPHIN ) injection 1 g (1 g Intramuscular Given 08/22/23 1816)    Initial Impression / Assessment and Plan / UC Course  I have reviewed the triage vital signs and the nursing notes.  Pertinent labs & imaging results that were available during my care of the patient were reviewed by me and considered in my medical decision making (see chart for details).  Urinalysis is consistent with acute cystitis with hematuria.  Patient with ongoing urinary symptoms despite recent treatment with Keflex .  She does have positive leukocytes and nitrites along with blood, urine culture is pending.  Patient  was given an injection of ceftriaxone  1 g IM as there are no pharmacies available or open at this time due to the holiday.  Will start patient on Ceftin  500 mg twice daily for complicated UTI.  Supportive care recommendations were provided and discussed with the patient to include increasing her fluids, developing a toileting schedule, and avoiding caffeine while symptoms persist.  Patient was given strict ER follow-up precautions. Patient also advised to follow-up with urology. Patient was in agreement with this plan of care and verbalizes understanding, all questions were answered, patient stable for discharge.   Final Clinical Impressions(s) / UC Diagnoses   Final diagnoses:  Acute cystitis without hematuria   Discharge Instructions   None    ED Prescriptions   None    PDMP not reviewed this encounter.   Gilmer Etta PARAS, NP 08/22/23 RONOLD    Gilmer Etta PARAS, NP 08/22/23 [phone]

## 2023-08-22 NOTE — Discharge Instructions (Signed)
 Urinalysis shows that you have another urinary tract infection.  A urine culture has been ordered.  You will be contacted if the results show that the medication prescribed today needs to be changed. You were given an injection of ceftriaxone  1 g today.  Start the antibiotic on 08/23/2023. You may take over-the-counter Tylenol  as needed for pain, fever, or general discomfort. Make sure you are drinking plenty of water .  Recommend at least 5-7 8 ounce glasses of water  daily. Develop a toileting schedule that will allow you to urinate at least every 2 hours. Avoid caffeine such as tea, soda, or coffee while symptoms persist. Go to the emergency department immediately if you experience fever, chills, or worsening urinary symptoms. I would like for you to follow-up with your urologist next week to let them know that you have developed another urinary tract infection or experiencing symptoms. Follow-up as needed.

## 2023-08-24 LAB — URINE CULTURE: Culture: >=100000 — AB

## 2023-08-25 ENCOUNTER — Ambulatory Visit (HOSPITAL_COMMUNITY): Payer: Self-pay

## 2023-08-27 NOTE — Telephone Encounter (Signed)
 Called pt to advise her of MD McKenzie recommendation pt id not answer lvm for pt to c/b

## 2023-08-27 NOTE — Telephone Encounter (Signed)
 Pt called back and asked if she could have extra  DMSO treatments possibly once a week to see if it helps before doing surgery pt also requested that we check insurance before scheduling extra treatments pt states she stays in pain when her bladder is full particularly on her left side and believes her bladder is irritated from uti's and having stones

## 2023-08-30 ENCOUNTER — Other Ambulatory Visit: Payer: Self-pay | Admitting: Urology

## 2023-08-30 DIAGNOSIS — N3010 Interstitial cystitis (chronic) without hematuria: Secondary | ICD-10-CM

## 2023-09-02 NOTE — Telephone Encounter (Signed)
 Pt called to confirm Rx refill pt notified that MD McKenzie did refill her Rx of methenamine  pt voiced her understanding pt also notified that MD agreed to 4 consecutive treatments of DMSO 4 treatments were schedule pt stated she wanted to go ahead and schedule however depending on outside appts she may have to change pt was advised that is fine just call ahead

## 2023-09-16 ENCOUNTER — Ambulatory Visit

## 2023-09-17 ENCOUNTER — Ambulatory Visit (INDEPENDENT_AMBULATORY_CARE_PROVIDER_SITE_OTHER)

## 2023-09-17 DIAGNOSIS — N3010 Interstitial cystitis (chronic) without hematuria: Secondary | ICD-10-CM | POA: Diagnosis not present

## 2023-09-17 MED ORDER — HYDROCORTISONE SOD SUC (PF) 100 MG IJ SOLR
100.0000 mg | Freq: Once | INTRAMUSCULAR | Status: AC
  Administered 2023-09-17: 100 mg

## 2023-09-17 MED ORDER — DIMETHYL SULFOXIDE 50 % IS SOLN
50.0000 mL | Freq: Once | INTRAVESICAL | Status: AC
  Administered 2023-09-17: 50 mL via URETHRAL

## 2023-09-17 MED ORDER — SODIUM BICARBONATE 8.4 % IV SOLN
50.0000 meq | Freq: Once | INTRAVENOUS | Status: AC
  Administered 2023-09-17: 50 meq

## 2023-09-17 MED ORDER — LIDOCAINE HCL 2 % IJ SOLN
10.0000 mL | Freq: Once | INTRAMUSCULAR | Status: AC
  Administered 2023-09-17: 200 mg

## 2023-09-17 NOTE — Progress Notes (Signed)
 Bladder Instillation DMSO  Due to Interstitial cystitis  patient is present today for a Bladder Instillation of DMSO treatment. Patient was cleaned and prepped in a sterile fashion with Betadinex3.  A 24 FR catheter was inserted, urine return was noted 50ml, urine was Dark yellow In color.  DMSO treatment was instilled into the bladder. The catheter was then removed. Patient tolerated well, no complications were noted pt was instructed to hold the instillation for 20 minutes and then will void it out. Pt voiced understanding.    Preformed by: Exie DASEN. CMA  Follow up/ Additional notes: as scheduled

## 2023-09-17 NOTE — Addendum Note (Signed)
 Addended by: SAMMIE EXIE HERO on: 09/17/2023 03:48 PM   Modules accepted: Orders

## 2023-09-18 LAB — URINALYSIS, ROUTINE W REFLEX MICROSCOPIC
Bilirubin, UA: NEGATIVE
Glucose, UA: NEGATIVE
Ketones, UA: NEGATIVE
Nitrite, UA: NEGATIVE
Protein,UA: NEGATIVE
RBC, UA: NEGATIVE
Specific Gravity, UA: 1.02 (ref 1.005–1.030)
Urobilinogen, Ur: 0.2 mg/dL (ref 0.2–1.0)
pH, UA: 6 (ref 5.0–7.5)

## 2023-09-18 LAB — MICROSCOPIC EXAMINATION: RBC, Urine: NONE SEEN /HPF (ref 0–2)

## 2023-09-24 ENCOUNTER — Ambulatory Visit

## 2023-09-25 ENCOUNTER — Ambulatory Visit (INDEPENDENT_AMBULATORY_CARE_PROVIDER_SITE_OTHER): Payer: Medicare Other | Admitting: Otolaryngology

## 2023-09-25 ENCOUNTER — Ambulatory Visit

## 2023-09-26 ENCOUNTER — Ambulatory Visit

## 2023-09-28 ENCOUNTER — Other Ambulatory Visit: Payer: Self-pay | Admitting: Family Medicine

## 2023-09-28 DIAGNOSIS — E119 Type 2 diabetes mellitus without complications: Secondary | ICD-10-CM

## 2023-10-02 ENCOUNTER — Ambulatory Visit

## 2023-10-06 ENCOUNTER — Ambulatory Visit (INDEPENDENT_AMBULATORY_CARE_PROVIDER_SITE_OTHER)

## 2023-10-06 DIAGNOSIS — N3010 Interstitial cystitis (chronic) without hematuria: Secondary | ICD-10-CM

## 2023-10-06 LAB — URINALYSIS, ROUTINE W REFLEX MICROSCOPIC
Bilirubin, UA: NEGATIVE
Glucose, UA: NEGATIVE
Ketones, UA: NEGATIVE
Nitrite, UA: NEGATIVE
Protein,UA: NEGATIVE
RBC, UA: NEGATIVE
Specific Gravity, UA: 1.02 (ref 1.005–1.030)
Urobilinogen, Ur: 0.2 mg/dL (ref 0.2–1.0)
pH, UA: 6 (ref 5.0–7.5)

## 2023-10-06 LAB — MICROSCOPIC EXAMINATION: Bacteria, UA: NONE SEEN

## 2023-10-06 MED ORDER — HYDROCORTISONE SOD SUC (PF) 100 MG IJ SOLR
100.0000 mg | Freq: Once | INTRAMUSCULAR | Status: AC
  Administered 2023-10-06: 100 mg

## 2023-10-06 MED ORDER — SODIUM BICARBONATE 8.4 % IV SOLN
50.0000 meq | Freq: Once | INTRAVENOUS | Status: AC
  Administered 2023-10-06: 50 meq

## 2023-10-06 MED ORDER — DIMETHYL SULFOXIDE 50 % IS SOLN
50.0000 mL | Freq: Once | INTRAVESICAL | Status: AC
  Administered 2023-10-06: 50 mL via URETHRAL

## 2023-10-06 MED ORDER — LIDOCAINE HCL 2 % IJ SOLN
10.0000 mL | Freq: Once | INTRAMUSCULAR | Status: AC
  Administered 2023-10-06: 200 mg

## 2023-10-06 NOTE — Progress Notes (Signed)
 Bladder Instillation DMSO  Due to Interstitial Cystitis patient is present today for a Bladder Instillation of DMSO treatment. Patient was cleaned and prepped in a sterile fashion with Betadinex3.  A 20 FR catheter was inserted, urine return was noted 100 ml, urine was Clear yellow In color.  DMSO treatment was instilled into the bladder. The catheter was then removed. Patient tolerated well, no complications were noted pt was instructed to hold the instillation for 20 minutes and then will void it out. Pt voiced understanding.    Preformed by: Carlos, CMA  Follow up/ Additional notes: keep weekly DMSO

## 2023-10-08 ENCOUNTER — Ambulatory Visit

## 2023-10-14 ENCOUNTER — Other Ambulatory Visit

## 2023-10-14 ENCOUNTER — Other Ambulatory Visit: Payer: Self-pay

## 2023-10-14 ENCOUNTER — Other Ambulatory Visit: Payer: Self-pay | Admitting: Urology

## 2023-10-14 ENCOUNTER — Telehealth: Payer: Self-pay | Admitting: Urology

## 2023-10-14 ENCOUNTER — Ambulatory Visit

## 2023-10-14 VITALS — BP 146/73 | HR 90

## 2023-10-14 DIAGNOSIS — N3010 Interstitial cystitis (chronic) without hematuria: Secondary | ICD-10-CM

## 2023-10-14 DIAGNOSIS — R3915 Urgency of urination: Secondary | ICD-10-CM

## 2023-10-14 DIAGNOSIS — R350 Frequency of micturition: Secondary | ICD-10-CM

## 2023-10-14 DIAGNOSIS — N3000 Acute cystitis without hematuria: Secondary | ICD-10-CM

## 2023-10-14 DIAGNOSIS — Z87440 Personal history of urinary (tract) infections: Secondary | ICD-10-CM

## 2023-10-14 LAB — URINALYSIS, ROUTINE W REFLEX MICROSCOPIC
Bilirubin, UA: NEGATIVE
Glucose, UA: NEGATIVE
Ketones, UA: NEGATIVE
Nitrite, UA: POSITIVE — AB
Specific Gravity, UA: 1.025 (ref 1.005–1.030)
Urobilinogen, Ur: 0.2 mg/dL (ref 0.2–1.0)
pH, UA: 6 (ref 5.0–7.5)

## 2023-10-14 LAB — MICROSCOPIC EXAMINATION
RBC, Urine: >30 /HPF — AB (ref 0–2)
WBC, UA: >30 /HPF — AB (ref 0–5)

## 2023-10-14 MED ORDER — CEPHALEXIN 500 MG PO CAPS: 500.0000 mg | ORAL_CAPSULE | Freq: Two times a day (BID) | ORAL | 0 refills | Status: DC

## 2023-10-14 NOTE — Telephone Encounter (Signed)
  Urologic History:  Any Recent Urologic Surgeries or Procedures:DMSO Recurrent UTI's:yes Cystitis: yes  Prostatitis:no Kidney or Bladder Stones: no Lab visit scheduled for urine drop off: Yes

## 2023-10-14 NOTE — Telephone Encounter (Signed)
 Patient was made aware that her UA was affected and MD, Dahlstedt sent her in keflex  BID X 5 days. Patient's DMSO was rescheduled due to UTI.

## 2023-10-14 NOTE — Telephone Encounter (Signed)
 Has weekly treatments and think she may have an infection. She has leg swelling and cannot sleep from having to urinate so much. She cannot control the urine. She would like to have a urinalysis

## 2023-10-15 ENCOUNTER — Ambulatory Visit

## 2023-10-16 ENCOUNTER — Telehealth: Payer: Self-pay

## 2023-10-16 LAB — URINE CULTURE

## 2023-10-16 NOTE — Telephone Encounter (Signed)
 Patient was made aware and voiced understanding.

## 2023-10-16 NOTE — Telephone Encounter (Signed)
-----   Message from Garnette HERO Dahlstedt sent at 10/16/2023  4:33 PM EDT ----- Let  patient know that her culture was positive, proper antibiotic was sent in

## 2023-10-17 ENCOUNTER — Other Ambulatory Visit: Payer: Self-pay | Admitting: Urology

## 2023-10-17 NOTE — Telephone Encounter (Signed)
 Patient states that what ever you send in needs to be coated the Keflex  is tearing up her stomach

## 2023-10-17 NOTE — Telephone Encounter (Signed)
 Patient is made aware and voiced understanding It is a capsule so it does have a coating. She has used this abx lots in the past--thast is why I sent it in b/c she has not complained of it before. If she wants a new med she can have macrobid  1 po q 12 hr #10

## 2023-10-17 NOTE — Telephone Encounter (Signed)
 Patient called said that there was suppose to be a new RX called in ,  patient went to the pharmacy and nothing is there .

## 2023-10-23 ENCOUNTER — Ambulatory Visit: Payer: Self-pay

## 2023-10-23 ENCOUNTER — Ambulatory Visit (INDEPENDENT_AMBULATORY_CARE_PROVIDER_SITE_OTHER): Admitting: Otolaryngology

## 2023-10-23 ENCOUNTER — Encounter (INDEPENDENT_AMBULATORY_CARE_PROVIDER_SITE_OTHER): Payer: Self-pay | Admitting: Otolaryngology

## 2023-10-23 VITALS — BP 133/72 | HR 90

## 2023-10-23 DIAGNOSIS — R490 Dysphonia: Secondary | ICD-10-CM

## 2023-10-23 DIAGNOSIS — J3089 Other allergic rhinitis: Secondary | ICD-10-CM

## 2023-10-23 DIAGNOSIS — R0981 Nasal congestion: Secondary | ICD-10-CM | POA: Diagnosis not present

## 2023-10-23 DIAGNOSIS — J383 Other diseases of vocal cords: Secondary | ICD-10-CM | POA: Diagnosis not present

## 2023-10-23 DIAGNOSIS — R0982 Postnasal drip: Secondary | ICD-10-CM | POA: Diagnosis not present

## 2023-10-23 DIAGNOSIS — H9393 Unspecified disorder of ear, bilateral: Secondary | ICD-10-CM

## 2023-10-23 DIAGNOSIS — R1310 Dysphagia, unspecified: Secondary | ICD-10-CM

## 2023-10-23 MED ORDER — FLUTICASONE PROPIONATE 50 MCG/ACT NA SUSP: 2.0000 | Freq: Every day | NASAL | 6 refills | Status: DC

## 2023-10-23 MED ORDER — IPRATROPIUM BROMIDE 0.03 % NA SOLN: 2.0000 | Freq: Two times a day (BID) | NASAL | 12 refills | Status: AC

## 2023-10-23 NOTE — Progress Notes (Signed)
 ENT Progress Note:   Update 10/23/2023  History of Present Illness    Discussed the use of AI scribe software for clinical note transcription with the patient, who gave verbal consent to proceed.  History of Present Illness Deborah Gray Gray is an 89 year old female who presents with chronic phlegm and hoarseness.  She experiences chronic phlegm that is difficult to clear, describing it as 'glued' in her throat. Initially located at the back of her throat, the phlegm has now moved to the area at the top of her collarbone. She uses OTC medications to help loosen the phlegm. Her sinuses feel more open with the nasal spray used at night, but the daytime spray is less effective.  She experiences hoarseness that fluctuates, sometimes becoming so severe that it is hard for her to talk. The hoarseness can resolve within two hours and seems related to the amount of phlegm present. Her voice is sometimes 'raspy', and she notes that the hoarseness may be due to fatigue from sustaining normal voice effort.  Her ears are very dry, which she attributes to the lack of ear wax drainage. She recalls her mother using sweet oil for ear dryness and considers trying it herself.  Her oldest son may retire soon, which could potentially help with her transportation needs.  Records Reviewed:  Initial Evaluation  Reason for Consult: hoarseness and post-nasal drainage/mucus in the throat   HPI: Discussed the use of AI scribe software for clinical note transcription with the patient, who gave verbal consent to proceed.  History of Present Illness   The patient is an 53 yoF, with a history of chronic nasal congestion, post-nasal drainage, presents with complaints of persistent nasal congestion and hoarseness with a sensation of mucus in he throat. They describe the nasal discharge as thick and glue-like, requiring significant effort to get it out and Mucinex to alleviate. The patient has been taking 1200mg  of  Mucinex at night and 600mg  in the morning to manage the symptoms, but expresses dissatisfaction with the amount of medication required and sx improvement.   The patient also reports intermittent hoarseness, which seems to resolve spontaneously after a few hours. They have been using a steroid nasal spray, mometasone , to manage these symptoms.  In addition to the nasal symptoms, the patient has a history of a hiatal hernia and has experienced episodes of choking while eating. They have been prescribed Levsin  by GI to manage these episodes. The patient also mentions a benign head tremor, which they have been managing without medication.  The patient has had previous imaging of the sinuses, but it has been several years since the last scan. They have also had a chest x-ray recently, which revealed a lung nodule/stable in size. The patient denies any cough unless there is significant nasal drainage.  The patient's symptoms have been ongoing for several years, and they express frustration with the persistent nature of their condition. They have been managing their symptoms with over-the-counter and prescription medications, but are seeking further evaluation and treatment options.   She has hx of esophageal spasm, IBS, esophageal rings, esophagitis on EGD 12/2021. Per GI office notes she also has chronic pancreatitis and pancreatic insufficiency. On Aciphex  currently (Rabeprazole )    Office note by Dr Deborah Gray Gray 12/26/22 Brief patient profile:  42 yowf never smoker  retired Geophysicist/field seismologist referred to pulmonary clinic in [place]  03/21/2022 by Dr Jacqulyn Ahle  for incidental lung nodules on w/u for hematuria.    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  Patient presents with   Consult      Ct scan abnormal   Dyspnea:  [facility]/ no cooking / limited by  back but still doing [employer] / [employer] parking depends on whether back bothering her  Cough: none  but always botherd by nasal congestion f/u by [employer]  Sleep: occ night sweats x 2 year / no wt loss or resp cc noct  SABA use: none  02: none  Rec You multiple nodules that the other with largest calcified typical of a benign process  To be sure, we will repeat your CT no contrast  in 3 months and see you back afterwords   - 03/21/2022  ESR 9 - 03/21/2022  Quant TB neg - 03/21/2022 cbc with diff   Eos 0.1   06/25/2022  f/u ov/[place] office/Wert re: MPNs  maint on dymista  / no other resp meds      Chief Complaint  Patient presents with   Follow-up      Pt f/u states that her breathing is fine  Dyspnea:  about the same / still doing grocery store  Cough: none unless active nasal drainage Sleeping: occ sweats head but not whole bed  SABA use: none 02: none  No longer losing wt  RecPlease schedule a follow up visit in 6 months but call sooner if needed with cxr on return as well    Office visit by GI PA-C Deborah Gray Gray 12/23/22 Deborah Gray  R Quant is a 89 y.o. female presenting today for follow up. Last seen 10/2022. She has history of GERD, esophageal spasms/nutcracker esophagus, idiopathic chronic pancreatitis with chronic pancreatic exocrine insufficiency, IBS with intermittent diarrhea, hemorrhoids. She tries to avoid PPIs due to concerns for causing memory issues. After last EGD, has been on aciphex  20mg  daily. Issues with abdominal pain and concern for bowel obstruction on CT in 02/2022, advised to hold off Levsin  and imodium at that time. Had noted increased use to control bowels around the holidays. F/u CT looked good. Suggested she continue levsin  prn but avoid imoidum use. She has had hemorrhoid banding neutrally and left lateral and anterior internal hemorrhoids several months ago.   At last ov she was having issues with fecal smearing/incomplete passage of stool. On DRE, palpable rectal  abnormality. She completed flex sig which showed semicircumferential nodularity on palpation. Rectal scar involving approximately a third of the circumferential distal rectum 3cm up from anal verge. Most likely due to prior 3 hemorrhoid bands. Scars a little bit proximal to the hemorrhoids. Noted to have minimal grade 2 hemorrhoids. Rectal scar and mucosa biopsied, benign.   Today: fecal smearing better. Benefiber 1 teaspoon daily. Probiotic daily. Stools more formed. Will have to go 2-3 times before gets empty. But overall better. No melena, brbpr. No abdominal pain. Having a lot of ugi symptoms lately. Feels like food stopped in center of chest worse in the evening. Bending over stuff comes up. Belching up sour stuff. Feels like she needs something added at night for reflux etc. Complains of early satiety. Biggest meal is lunch, usually not hungry at dinner. Not happy with food offerings at the assisted living facility.    Flex sig 10/2022: -rectal scar s/p bx, benign -minimal grade 2 hemorrhoids -distal sigmoid diverticulosis  EGD 12/2021: -Distal esophageal  rings,2 tandem. Overlying distal esophageal erosion consistent with erosive reflux esophagitis. Status post dilation.  -Status post esophageal biopsy as described -Inflamed appearing stomach of uncertain significance diffusely status post biopsy normal duodenal bulb and second portion of the duodenum. -Patient not on any meaningful acid suppression therapy. She takes pancreatic enzymes as well. Concerns about dementia risk with PPIs previously. In this setting, the benefits of acid suppression therapy with a PPI far outweighs any theoretical risks of dementia.   Colonoscopy 03/2016: -diverticulosis   Past Medical History:  Diagnosis Date   Arthritis    Benign neoplasm of colon    Constipation    Diaphragmatic hernia without mention of obstruction or gangrene    Diverticulosis of colon (without mention of hemorrhage)    Dysphagia,  pharyngoesophageal phase    Esophageal reflux    Essential hypertension    Gastritis    Heart murmur    Hemorrhoids    Hyperlipidemia    Internal hemorrhoids without mention of complication    Interstitial cystitis    Left wrist fracture    PONV (postoperative nausea and vomiting)    PSVT (paroxysmal supraventricular tachycardia) (HCC)    Stricture and stenosis of esophagus    Type 2 diabetes mellitus (HCC)    Urinary frequency 08/04/2023    Past Surgical History:  Procedure Laterality Date   ABDOMINAL HYSTERECTOMY     BACTERIAL OVERGROWTH TEST N/A 09/06/2015   Procedure: BACTERIAL OVERGROWTH TEST;  Surgeon: Lamar CHRISTELLA Hollingshead, MD;  Location: [facility];  Service: Endoscopy;  Laterality: N/A;  800   BIOPSY  01/02/2022   Procedure: BIOPSY;  Surgeon: Hollingshead Lamar CHRISTELLA, MD;  Location: [facility];  Service: Endoscopy;;   BIOPSY  11/18/2022   Procedure: BIOPSY;  Surgeon: Hollingshead Lamar CHRISTELLA, MD;  Location: [facility];  Service: Endoscopy;;   BREAST LUMPECTOMY Right    benign   CATARACT EXTRACTION Bilateral 2006   Implants in both, surgeries done 6 weeks apart   CHOLECYSTECTOMY     COLONOSCOPY  03/08/02   Jakie: diverticulosis, internal hemorrhoids, adenomatous colon polyp   COLONOSCOPY  01/23/04   Jakie: diverticulosis, internal hemorrhoids   COLONOSCOPY  09/25/05   Jakie: diverticulosis   COLONOSCOPY  07/05/09   Jakie: severe diverticulosis   COLONOSCOPY  01/21/11   Jakie: severe diverticulosis in sigmoid to desc colon, int hemorrhoids, follow up TCS in 5 years   COLONOSCOPY N/A 04/10/2016   [name]: diverticulosis   CYSTOSCOPY W/ URETERAL STENT PLACEMENT Right 03/15/2021   Procedure: CYSTOSCOPY WITH RETROGRADE PYELOGRAM/URETERAL STENT PLACEMENT;  Surgeon: Selma Donnice SAUNDERS, MD;  Location: [facility];  Service: Urology;  Laterality: Right;   CYSTOSCOPY WITH RETROGRADE PYELOGRAM, URETEROSCOPY AND STENT PLACEMENT Right 03/29/2021   Procedure: CYSTOSCOPY WITH RETROGRADE  PYELOGRAM, URETEROSCOPY AND STENT EXCHANGE;  Surgeon: Sherrilee Belvie CROME, MD;  Location: [facility];  Service: Urology;  Laterality: Right;   DILATION AND CURETTAGE OF UTERUS     ESOPHAGEAL MANOMETRY  03/21/08   Patterson: findings c/w Nutcracker Esophagus   ESOPHAGOGASTRODUODENOSCOPY  03/08/02   Jakie: esophageal stricture, chronic gerd, s/p dilation.    ESOPHAGOGASTRODUODENOSCOPY  01/23/04   Jakie: esophageal stricture, gastritis, hiatal hernia   ESOPHAGOGASTRODUODENOSCOPY  09/25/05   Jakie: gastritis, benign bx, no H.pylori   ESOPHAGOGASTRODUODENOSCOPY  07/05/09   Jakie: gastropathy, benign small bowel bx and gastric bx   ESOPHAGOGASTRODUODENOSCOPY N/A 05/15/2015   Dr. Hollingshead- diffuse moderate inflammation characterized by congestion (edema), erythema, and linear erosions was found in the entire examined stomach.  bx= reactive gastropathy   ESOPHAGOGASTRODUODENOSCOPY (EGD) WITH PROPOFOL  N/A 01/02/2022   Procedure: ESOPHAGOGASTRODUODENOSCOPY (EGD) WITH PROPOFOL ;  Surgeon: Shaaron Lamar HERO, MD;  [facility];  Service: Endoscopy;  Laterality: N/A;  10:30am, asa 2   ESOPHAGOGASTRODUODENOSCOPY (EGD) WITH PROPOFOL  N/A 04/23/2023   Procedure: ESOPHAGOGASTRODUODENOSCOPY (EGD) WITH PROPOFOL ;  Surgeon: Shaaron Lamar HERO, MD;  [facility];  Service: Endoscopy;  Laterality: N/A;  1130AM, ASA 3   FLEXIBLE SIGMOIDOSCOPY N/A 11/18/2022   Procedure: FLEXIBLE SIGMOIDOSCOPY;  Surgeon: Shaaron Lamar HERO, MD;  [facility]: [facility];  Service: Endoscopy;  Laterality: N/A;  10:00 am, asa 2   FOOT SURGERY Left    HEMORRHOID BANDING  2017   Dr.[name]   HOLMIUM LASER APPLICATION Right 03/29/2021   Procedure: HOLMIUM LASER APPLICATION;  Surgeon: Sherrilee Belvie CROME, MD;  Location: [facility];  Service: Urology;  Laterality: Right;   LAPAROSCOPIC VAGINAL HYSTERECTOMY     MALONEY DILATION N/A 01/02/2022   Procedure: AGAPITO DILATION;  Surgeon: Shaaron Lamar HERO, MD;  [facility]: [facility];  Service:  Endoscopy;  Laterality: N/A;   MALONEY DILATION N/A 04/23/2023   Procedure: DILATION, ESOPHAGUS, USING MALONEY DILATOR;  Surgeon: Shaaron Lamar HERO, MD;  [facility];  Service: Endoscopy;  Laterality: N/A;   ORIF WRIST FRACTURE Left 06/23/2018   Procedure: OPEN REDUCTION INTERNAL FIXATION (ORIF) LEFT WRIST FRACTURE;  Surgeon: Beverley Evalene BIRCH, MD;  Location: [facility];  Service: Orthopedics;  Laterality: Left;  regional arm block   RECTOCELE REPAIR     TOTAL KNEE ARTHROPLASTY Right     Family History  Problem Relation Age of Onset   Ovarian cancer Mother    Diabetes Father    Heart disease Father    Breast cancer Sister    Liver cancer Sister    Colon cancer Cousin        Paternal side   Diabetes Maternal Aunt    Liver disease Cousin        Maternal side, never drank    Social History:  reports that she has never smoked. She has never used smokeless tobacco. She reports that she does not drink alcohol and does not use drugs.  Allergies:  Allergies  Allergen Reactions   Adhesive [Tape] Other (See Comments)     Blisters    Estrogens Other (See Comments)    Migraines    Other Other (See Comments)   Sulfonamide Derivatives Rash    Medications: I have reviewed the patient's current medications.  The PMH, PSH, Medications, Allergies, and SH were reviewed and updated.  ROS: Constitutional: Negative for fever, weight loss and weight gain. Cardiovascular: Negative for chest pain and dyspnea on exertion. Respiratory: Is not experiencing shortness of breath at rest. Gastrointestinal: Negative for nausea and vomiting. Neurological: Negative for headaches. Psychiatric: The patient is not nervous/anxious  Blood pressure 133/72, pulse 90, SpO2 95%.  PHYSICAL EXAM:  Exam: General: Well-developed, well-nourished Respiratory Respiratory effort: Equal inspiration and expiration without stridor Cardiovascular Peripheral Vascular: Warm extremities with equal  color/perfusion Eyes: No nystagmus with equal extraocular motion bilaterally Neuro/Psych/Balance: Patient oriented to person, place, and time; Appropriate mood and affect; Gait is intact with no imbalance; Cranial nerves I-XII are intact Head and Face Inspection: Normocephalic and atraumatic without mass or lesion Palpation: Facial skeleton intact without bony stepoffs Salivary Glands: No mass or tenderness Facial Strength: Facial motility symmetric and full bilaterally ENT Pinna: External ear intact and fully developed External canal: Canal is patent with  intact skin Tympanic Membrane: Clear and mobile External Nose: No scar or anatomic deformity Lips, Teeth, and gums: Mucosa and teeth intact and viable TMJ: No pain to palpation with full mobility Oral cavity/oropharynx: No erythema or exudate, no lesions present Neck Neck and Trachea: Midline trachea without mass or lesion Thyroid : No mass or nodularity Lymphatics: No lymphadenopathy  Studies Reviewed: 12/26/22 EXAM: CHEST - 2 VIEW   COMPARISON:  06/25/2022.   FINDINGS: Unremarkable cardiac silhouette. Bibasilar opacities stable consistent with scarring or subsegmental atelectasis. Normal pulmonary vasculature. No pneumothorax. Left lower lung nodule could be a calcified granuloma. Additional calcifications overlie the hila. Aorta is calcified. There are thoracic degenerative changes.   IMPRESSION: Bibasilar scarring or subsegmental atelectasis. Unchanged left lower lung nodule and evidence of old granulomatous disease.  Esophagram 03/05/23 CLINICAL DATA:  Patient with intermittent dysphagia with solid foods, bloating, intermittent choking/coughing during eating. Request for esophagram for further evaluation.   EXAM: ESOPHAGUS/BARIUM SWALLOW/TABLET STUDY   TECHNIQUE: Single contrast examination was performed using thin liquid barium. This exam was performed by Clotilda Hesselbach, PA-C, and was supervised and  interpreted by Ester Sides, MD.   FLUOROSCOPY: Radiation Exposure Index (as provided by the fluoroscopic device): 28.20 mGy Kerma   COMPARISON:  CT abd/pelvis 03/10/22, esophagram 12/03/21.   FINDINGS: Swallowing: Appears normal. Intermittent vestibular penetration without aspiration seen.   Pharynx: Distended upper esophagus beginning near the level of the vocal cords.   Esophagus: Moderately patulous upper esophagus to the level of the aortic notch with contrast stasis in this area. There is an area at the aortic notch which becomes significantly narrow/collapses in on itself during primary peristalsis and appears to cause brief stasis of contrast in the upper esophagus however this area becomes fully distended following bolus clearance (series #5). Moderate smooth narrowing of the gastroesophageal junction.   Esophageal motility: Severe lower esophageal dysmotility. Proximal escape waves and contrast stasis seen in the upper esophagus with each bolus. Retrograde flow of contrast up to the level of the aortic notch.   Hiatal Hernia: None.   Gastroesophageal reflux: Retrograde flow of contrast noted, no true gastroesophageal reflux seen on limited exam.   Ingested 13 mm barium tablet: Became stuck just inferior to the aortic notch - patient reported presenting symptoms during this time. The tablet passed after several more sips of water  before becoming stuck again at the gastroesophageal junction. The tablet was observed for 3 minutes in this area without passage into the stomach despite additional sips of water  and barium. The patient was informed that the tablet would dissolve.   Other: None.   IMPRESSION: Single contrast esophagram significant for:   1.  Intermittent vestibular penetration without aspiration.   2.  Patulous proximal esophagus.   3. Severe lower esophageal dysmotility with proximal escape waves and contrast stasis in the upper esophagus.  Retrograde flow of contrast from the lower esophagus up to the level of the aortic notch.   4. Barium tablet became stuck briefly just inferior to the aortic notch and again at the gastroesophageal junction, suggestive of possible esophageal strictures at these locations.    Assessment/Plan: Encounter Diagnoses  Name Primary?   Glottic insufficiency Yes   Post-nasal drip    Age-related vocal fold atrophy    Environmental and seasonal allergies    Dysphonia    Chronic nasal congestion       Assessment and Plan Chronic hoarseness and voice changes (raspy voice) present for a long time several years. Hx of GERD and extensive  GI hx summarized below:  She has hx of esophageal spasm, IBS, esophageal rings, esophagitis on EGD 12/2021. Per GI office notes she also has chronic pancreatitis and pancreatic insufficiency. On Aciphex  currently (Rabeprazole )  Also established with Pulm, Dr Deborah Gray Gray who diagnosed her with upper airway cough syndrome. Has pulmonary nodule and granulomatous disease on chest imaging. She has chronic cough. She has post-nasal drainage.   Exam including flexible laryngoscopy with VF atrophy, laryngeal tremor, she has head tremor hx as well. She also had GERD LPR changes on exam. There was mild pooling of secretions noted. She had evidence of scant purulent crusting along posterior nasopharynx but no pus or polyps in nasal passages. She had evidence of post-nasal drainage.     -Consider voice therapy in the future. We discussed it but she would like to hold off at this time - MBS esophagram - medical management of GERD LPR and post-nasal drainage    Chronic Nasal Congestion: Persistent despite use of saline and Flonase . Noted thick mucus along posterior nasopharyngeal wall -Nasonex  2 puffs b/l nares in am and Ipratropium Bromide  2 puffs b/l nares in pm -Initiate saline nasal rinses once or twice daily. -Consider sinus CT scan in the future to evaluate for chronic sinus  disease if sx will not improve - start weaning off Mucinex, we discussed the dose is too high  Chronic dysphagia  History of choking with eating, esophageal spasms, extensive GI hx of distal esophageal rings, esophagitis, on medication for GERD and esophageal spasm. Hx of hiatal hernia -Order swallow study to evaluate for aspiration and other swallowing disorders - MBS esophagram - continue Asiphex and Levsin  per GI recommendations  Follow-up after swallow study to discuss results and potential need for voice therapy.     Update last OV Assessment and Plan    Chronic Nasal congestion and drainage Chronic nasal congestion and drainage with mucus. Symptoms improved with current nasal spray regimen.  - Continue current nasal sprays (Flonase  in am and Atrovent  in pm)  - Refilled nasal spray prescriptions - Advise on proper nasal saline rinsing technique   Esophageal dysphagia and esophageal stricture  Esophageal dysphagia with recent esophageal dilation. Barium swallow test showed pill retention and esophageal narrowing, which prompted GI visit and EGD/balloon dilation.   She did not have MBS as planned, and we discussed that if she is able to we can consider getting FEES to rule out aspiration. There was some penetration noted on her esophagram.  - she would like to think about it and will schedule FEES in 2-3 mo - placed an order for FEES  Chronic Dysphonia   Evidence of VF atrophy glottic insufficiency on scope exam during initial evaluation. We discussed voice therapy, injection augmentation today - consider voice therapy and injection augmentation based on results of FEES  Update 10/23/23    Assessment and Plan Assessment & Plan Chronic hoarseness and dysphonia due to age-related vocal cord changes Chronic hoarseness and dysphonia due to age-related thinning of the vocal cords. Discussed voice therapy and filler injection as potential interventions. She expressed concerns about  transportation for procedures. - Offer voice therapy if transportation becomes feasible. - Provided information on filler injection procedure for future consideration.  Chronic postnasal drip and nasal congestion Chronic postnasal drip and nasal congestion with some improvement. Night nasal spray provides more relief. Mucinex effective for morning phlegm management. - Continue current nasal sprays. Refill sent for Atrovent  and Flonase  - Continue Mucinex as needed.  Dry ears Complaints of dry  ears with no ear wax present. Discussed sweet oil as a remedy. - Recommend sweet oil for dry ears.  Dysphagia - improved after esophageal dilation - monitor for now       Elena Larry, MD Otolaryngology [facility] Phone: [phone] Fax: [phone]    10/23/2023, 3:02 PM

## 2023-10-23 NOTE — Patient Instructions (Signed)

## 2023-10-23 NOTE — Progress Notes (Signed)
 Patients BP was a little elevated. Patient stated it sometimes does that. Took a second BP and it went down to a normal level

## 2023-10-23 NOTE — Telephone Encounter (Signed)
 FYI Only or Action Required?: FYI only for provider.  Patient was last seen in primary care on 08/06/2023 by Cook, Jayce G, DO.  Called Nurse Triage reporting Heart Problem.  Symptoms began several weeks ago.  Interventions attempted: Prescription medications: Losartan .  Symptoms are: gradually worsening.  Triage Disposition: See PCP Within 2 Weeks  Patient/caregiver understands and will follow disposition?: Yes  Copied from CRM [id number]. Topic: Clinical - Red Word Triage >> Oct 23, 2023  9:44 AM Treva T wrote: [facility] that prompted transfer to Nurse Triage:  Recevied call from patient, states she had previously discussed vascular problems around ankles, swelling, redness, left ankle worse than the right, and a knot on left ankle, also has been having blood pressure fluctuating, (per patient unable to check blood pressure, as she dose not have a monitor to check it) and fast heartbeat. Reason for Disposition  [1] Systolic BP >= 130 OR Diastolic >= 80 AND [2] history of heart problems, kidney disease or diabetes  [1] Systolic BP >= 130 OR Diastolic >= 80 AND [2] taking BP medications  Answer Assessment - Initial Assessment Questions 1. BLOOD PRESSURE: What is your blood pressure? Did you take at least two measurements 5 minutes apart?     149/78   2. ONSET: When did you take your blood pressure?      A few days ago  3. HOW: How did you take your blood pressure? (e.g., automatic home BP monitor, visiting nurse)     *No Answer* 4. HISTORY: Do you have a history of high blood pressure?     Yes  5. MEDICINES: Are you taking any medicines for blood pressure? Have you missed any doses recently?     Yes, is taking Losartan   6. OTHER SYMPTOMS: Do you have any symptoms? (e.g., blurred vision, chest pain, difficulty breathing, headache, weakness)     Intermittent headaches, ankle swelling  7. PREGNANCY: Is there any chance you are pregnant? When was your last  menstrual period?     no  Protocols used: Blood Pressure - High-A-AH

## 2023-10-24 NOTE — Telephone Encounter (Signed)
 Spoke with patient and she does not feel this needs to be seen sooner.  She does not want to see  a different provider.  Patient says that if she gets worse she will seek medical attention.

## 2023-10-24 NOTE — Telephone Encounter (Signed)
 Cook, Jayce G, DO      10/23/23  1:24 PM Needs to be seen sooner than 2 weeks.

## 2023-10-28 ENCOUNTER — Ambulatory Visit (INDEPENDENT_AMBULATORY_CARE_PROVIDER_SITE_OTHER)

## 2023-10-28 DIAGNOSIS — N3010 Interstitial cystitis (chronic) without hematuria: Secondary | ICD-10-CM

## 2023-10-28 MED ORDER — HYDROCORTISONE SOD SUC (PF) 100 MG IJ SOLR
100.0000 mg | Freq: Once | INTRAMUSCULAR | Status: AC
  Administered 2023-10-28: 100 mg

## 2023-10-28 MED ORDER — LIDOCAINE HCL 2 % IJ SOLN
10.0000 mL | Freq: Once | INTRAMUSCULAR | Status: AC
  Administered 2023-10-28: 200 mg

## 2023-10-28 MED ORDER — DIMETHYL SULFOXIDE 50 % IS SOLN
50.0000 mL | Freq: Once | INTRAVESICAL | Status: AC
  Administered 2023-10-28: 50 mL via URETHRAL

## 2023-10-28 MED ORDER — SODIUM BICARBONATE 8.4 % IV SOLN
50.0000 meq | Freq: Once | INTRAVENOUS | Status: AC
  Administered 2023-10-28: 50 meq

## 2023-10-28 MED ORDER — LIDOCAINE HCL 1 % IJ SOLN: 20.0000 mL | Freq: Once | INTRAMUSCULAR | Status: DC

## 2023-10-28 NOTE — Progress Notes (Addendum)
 Bladder Instillation DMSO  Due to Interstitial Cystitis patient is present today for a Bladder Instillation of DMSO treatment. Patient was cleaned and prepped in a sterile fashion with Betadinex3.  A 20 FR catheter was inserted, urine return was noted 100 ml, urine was Clear yellow In color.  DMSO treatment was instilled into the bladder. The catheter was then removed. Patient tolerated well, no complications were noted pt was instructed to hold the instillation for 20 minutes and then will void it out. Pt voiced understanding.  Pt state's she is having left flank pain along with bladder spasm.    Preformed by: Carlos, CMA  Follow up/ Additional notes: Keep weekly DMSO

## 2023-10-29 LAB — URINALYSIS, ROUTINE W REFLEX MICROSCOPIC
Bilirubin, UA: NEGATIVE
Glucose, UA: NEGATIVE
Ketones, UA: NEGATIVE
Nitrite, UA: NEGATIVE
Protein,UA: NEGATIVE
RBC, UA: NEGATIVE
Specific Gravity, UA: 1.02 (ref 1.005–1.030)
Urobilinogen, Ur: 0.2 mg/dL (ref 0.2–1.0)
pH, UA: 6 (ref 5.0–7.5)

## 2023-10-29 LAB — MICROSCOPIC EXAMINATION: Bacteria, UA: NONE SEEN

## 2023-11-03 ENCOUNTER — Ambulatory Visit: Admitting: Family Medicine

## 2023-11-03 VITALS — BP 159/72 | HR 105 | Temp 97.7°F | Wt 138.0 lb

## 2023-11-03 DIAGNOSIS — I10 Essential (primary) hypertension: Secondary | ICD-10-CM

## 2023-11-03 DIAGNOSIS — R600 Localized edema: Secondary | ICD-10-CM | POA: Diagnosis not present

## 2023-11-03 DIAGNOSIS — R519 Headache, unspecified: Secondary | ICD-10-CM

## 2023-11-03 MED ORDER — LOSARTAN POTASSIUM 100 MG PO TABS: 100.0000 mg | ORAL_TABLET | Freq: Every day | ORAL | 1 refills | Status: DC

## 2023-11-03 NOTE — Patient Instructions (Addendum)
 Compression stockings.  Tylenol  as needed for headed.  Losartan  increased.  Follow up in 2 weeks.

## 2023-11-04 ENCOUNTER — Other Ambulatory Visit: Payer: Self-pay

## 2023-11-04 ENCOUNTER — Ambulatory Visit

## 2023-11-04 DIAGNOSIS — N3010 Interstitial cystitis (chronic) without hematuria: Secondary | ICD-10-CM

## 2023-11-04 DIAGNOSIS — R600 Localized edema: Secondary | ICD-10-CM | POA: Insufficient documentation

## 2023-11-04 DIAGNOSIS — R519 Headache, unspecified: Secondary | ICD-10-CM | POA: Insufficient documentation

## 2023-11-04 LAB — URINALYSIS, ROUTINE W REFLEX MICROSCOPIC
Bilirubin, UA: NEGATIVE
Glucose, UA: NEGATIVE
Ketones, UA: NEGATIVE
Nitrite, UA: NEGATIVE
Protein,UA: NEGATIVE
RBC, UA: NEGATIVE
Specific Gravity, UA: 1.015 (ref 1.005–1.030)
Urobilinogen, Ur: 0.2 mg/dL (ref 0.2–1.0)
pH, UA: 6 (ref 5.0–7.5)

## 2023-11-04 LAB — MICROSCOPIC EXAMINATION: Bacteria, UA: NONE SEEN

## 2023-11-04 MED ORDER — HYDROCORTISONE SOD SUC (PF) 100 MG IJ SOLR
100.0000 mg | Freq: Once | INTRAMUSCULAR | Status: AC
  Administered 2023-11-04: 100 mg

## 2023-11-04 MED ORDER — MECLIZINE HCL 25 MG PO TABS: 25.0000 mg | ORAL_TABLET | Freq: Three times a day (TID) | ORAL | 1 refills | Status: AC | PRN

## 2023-11-04 MED ORDER — DIMETHYL SULFOXIDE 50 % IS SOLN
50.0000 mL | Freq: Once | INTRAVESICAL | Status: AC
  Administered 2023-11-04: 50 mL via URETHRAL

## 2023-11-04 MED ORDER — LIDOCAINE HCL 2 % IJ SOLN
10.0000 mL | Freq: Once | INTRAMUSCULAR | Status: AC
  Administered 2023-11-04: 200 mg

## 2023-11-04 MED ORDER — SODIUM BICARBONATE 8.4 % IV SOLN
50.0000 meq | Freq: Once | INTRAVENOUS | Status: AC
  Administered 2023-11-04: 50 meq

## 2023-11-04 NOTE — Assessment & Plan Note (Signed)
Rx given for compression stockings.

## 2023-11-04 NOTE — Assessment & Plan Note (Signed)
 Uncontrolled.  Increasing losartan .  Follow-up in 2 weeks.

## 2023-11-04 NOTE — Progress Notes (Signed)
 Subjective:  Patient ID: Deborah Gray, female    DOB: 08/28/1933  Age: 89 y.o. MRN: [id number]  CC:   Chief Complaint  Patient presents with   left side temple pain   vericose veins and leg swelling    bp elevation    HPI:  89 year old female presents for evaluation of the above.  Patient reports ongoing edema of the lower extremities.  She has significant varicose veins.  Blood pressure has been elevated recently.  BP 159/72 currently. Currently on Losartan  50 mg daily.   Patient also reports intermittent pain of the left temple.  No pain currently.  She states that she has had this in the past when she was younger and was on oral contraceptives.  She states that she is seeing ophthalmology regarding macular degeneration.  No further worsening of her vision.  Patient Active Problem List   Diagnosis Date Noted   Lower extremity edema 11/04/2023   Temporal pain 11/04/2023   Urge incontinence 08/04/2023   Upper airway cough syndrome 12/26/2022   Interstitial cystitis 10/24/2022   History of recurrent UTIs 10/24/2022   Bilateral renal cysts 10/24/2022   Osteoporosis 07/05/2022   Multiple pulmonary nodules determined by computed tomography of lung 03/21/2022   Nephrolithiasis 03/15/2021   Prolapsed internal hemorrhoids, grade 3 01/23/2021   Spinal stenosis of lumbar region 12/02/2017   Osteoarthritis of left knee 12/02/2017   Degeneration of lumbar intervertebral disc 11/18/2017   IBS (irritable bowel syndrome) 01/30/2015   Type 2 diabetes mellitus (HCC) 12/22/2014   Hyperlipidemia, unspecified 12/22/2014   Essential hypertension 11/25/2013   Chronic pancreatitis (HCC) 02/26/2011   Gastroesophageal reflux disease 01/18/2011    Social Hx   Social History   Socioeconomic History   Marital status: Widowed    Spouse name: Not on file   Number of children: 3   Years of education: Not on file   Highest education level: Not on file  Occupational History    Occupation: Retired  Tobacco Use   Smoking status: Never   Smokeless tobacco: Never   Tobacco comments:    Never smoked  Vaping Use   Vaping status: Never Used  Substance and Sexual Activity   Alcohol use: No    Alcohol/week: 0.0 standard drinks of alcohol   Drug use: No   Sexual activity: Not Currently  Other Topics Concern   Not on file  Social History Narrative   Widowed since 07/2018. Married x 61 years.   3 sons. 58, 1963 and 1966.   Social Drivers of Corporate investment banker Strain: Low Risk  (05/31/2022)   Overall Financial Resource Strain (CARDIA)    Difficulty of Paying Living Expenses: Not hard at all  Food Insecurity: No Food Insecurity (05/31/2022)   Hunger Vital Sign    Worried About Running Out of Food in the Last Year: Never true    Ran Out of Food in the Last Year: Never true  Transportation Needs: No Transportation Needs (05/31/2022)   PRAPARE - Administrator, Civil Service (Medical): No    Lack of Transportation (Non-Medical): No  Physical Activity: Sufficiently Active (05/31/2022)   Exercise Vital Sign    Days of Exercise per Week: 5 days    Minutes of Exercise per Session: 30 min  Stress: No Stress Concern Present (05/31/2022)   [employer] of Occupational Health - Occupational Stress Questionnaire    Feeling of Stress : Not at all  Social Connections: Moderately Integrated (  05/31/2022)   Social Connection and Isolation Panel    Frequency of Communication with Friends and Family: More than three times a week    Frequency of Social Gatherings with Friends and Family: More than three times a week    Attends Religious Services: More than 4 times per year    Active Member of [employer] or Organizations: Yes    Attends Banker Meetings: More than 4 times per year    Marital Status: Widowed    Review of Systems Per HPI  Objective:  BP (!) 159/72   Pulse (!) 105   Temp 97.7 F (36.5 C)   Wt 138 lb (62.6 kg)   SpO2 97%    BMI 23.69 kg/m      11/03/2023    4:25 PM 11/03/2023    3:54 PM 11/03/2023    3:53 PM  BP/Weight  Systolic BP 159 151 155  Diastolic BP 72 73 73  Wt. (Lbs)   138  BMI   23.69 kg/m2    Physical Exam Vitals and nursing note reviewed.  Constitutional:      General: She is not in acute distress.    Appearance: Normal appearance.  HENT:     Head: Normocephalic and atraumatic.  Cardiovascular:     Rate and Rhythm: Normal rate and regular rhythm.  Pulmonary:     Effort: Pulmonary effort is normal.     Breath sounds: Normal breath sounds.  Neurological:     Mental Status: She is alert.  Psychiatric:        Mood and Affect: Mood normal.        Behavior: Behavior normal.     Lab Results  Component Value Date   WBC 6.9 07/28/2023   HGB 12.6 07/28/2023   HCT 39.7 07/28/2023   PLT 227 07/28/2023   GLUCOSE 116 (H) 07/28/2023   CHOL 166 07/28/2023   TRIG 52 07/28/2023   HDL 68 07/28/2023   LDLCALC 87 07/28/2023   ALT 15 07/28/2023   AST 15 07/28/2023   NA 145 (H) 07/28/2023   K 5.1 07/28/2023   CL 105 07/28/2023   CREATININE 0.79 07/28/2023   BUN 25 07/28/2023   CO2 21 07/28/2023   TSH 2.720 11/19/2021   INR 1.7 (H) 04/08/2008   HGBA1C 6.1 (H) 07/28/2023   MICROALBUR 1.0 11/22/2013     Assessment & Plan:  Lower extremity edema Assessment & Plan: Rx given for compression stockings.   Essential hypertension Assessment & Plan: Uncontrolled.  Increasing losartan .  Follow-up in 2 weeks.  Orders: -     Losartan  Potassium; Take 1 tablet (100 mg total) by mouth daily.  Dispense: 90 tablet; Refill: 1  Temporal pain Assessment & Plan: Could be related to hypertension.  Doubt temporal arteritis.  Follow-up in 2 weeks.     Follow-up: 2 weeks  Azizah Lisle Bluford DO [facility]

## 2023-11-04 NOTE — Assessment & Plan Note (Signed)
 Could be related to hypertension.  Doubt temporal arteritis.  Follow-up in 2 weeks.

## 2023-11-04 NOTE — Progress Notes (Signed)
 Bladder Instillation DMSO #4  Due to Interstitial Cystitis patient is present today for a Bladder Instillation of DMSO treatment. Patient was cleaned and prepped in a sterile fashion with Betadinex3.  A 20 FR catheter was inserted, urine return was noted 125 ml, urine was Amber In color.  DMSO treatment was instilled into the bladder. The catheter was then removed. Patient tolerated well, no complications were noted pt was instructed to hold the instillation for 20 minutes and then will void it out. Pt voiced understanding.    Preformed by: Deborah Gray, CMA  Follow up/ Additional notes: Keep F/U appointment with Dr. Sherrilee with RUS and PVR

## 2023-11-07 ENCOUNTER — Telehealth: Payer: Self-pay

## 2023-11-07 NOTE — Telephone Encounter (Signed)
 Pt was made aware and voiced understanding. Pt scheduled for monthly DMSO treatment in October. Verbalized understadning

## 2023-11-07 NOTE — Telephone Encounter (Signed)
-----   Message from Belvie Clara sent at 11/06/2023  2:18 PM EDT ----- Regarding: RE: DMSO She does not need to see me soone ----- Message ----- From: Gretta Carlos SAUNDERS, CMA Sent: 11/04/2023   3:23 PM EDT To: Belvie LITTIE Clara, MD Subject: DMSO                                           Mrs. Ollis came in today for DMSO. Today made 4 weeks. How would you like to proceed, do she need to see you sooner? Pt has f/u 01/21/2024 w/ RUS and PVR.  Two weeks ago Pt state's she is having left flank pain along with bladder spasm.  Pt state's she is still having bladder spasm and left flank but not as bad as it was before. Please advised.

## 2023-11-11 ENCOUNTER — Ambulatory Visit (INDEPENDENT_AMBULATORY_CARE_PROVIDER_SITE_OTHER)

## 2023-11-11 ENCOUNTER — Telehealth: Payer: Self-pay | Admitting: Urology

## 2023-11-11 DIAGNOSIS — R350 Frequency of micturition: Secondary | ICD-10-CM | POA: Diagnosis not present

## 2023-11-11 DIAGNOSIS — R3915 Urgency of urination: Secondary | ICD-10-CM

## 2023-11-11 DIAGNOSIS — N3289 Other specified disorders of bladder: Secondary | ICD-10-CM

## 2023-11-11 DIAGNOSIS — N3010 Interstitial cystitis (chronic) without hematuria: Secondary | ICD-10-CM

## 2023-11-11 NOTE — Telephone Encounter (Signed)
 Return call to pt state's she had to voided every 30 minutes over the weekend. Pt state's she took AZO  and this seem to calm down some of her OAB symptoms. Pt is wonder if she may have a UTI or kidney stones. Pt is advised to drop off urine specimen to determined cause. Verbalized understanding  Dysuria  Patient called with c/o dysuria x 2-3 days days.  Pain: aching Bladder spasm  Severity:5/10  Associated Signs and Symptoms:  Fever: noTemp.0 Chills: No Hematuria: no Urgency: yes Frequency: yes Hesitancy:no Incontinence: no Nausea: no Vomiting: no  Urologic History:  Any Recent Urologic Surgeries or Procedures:yes DMSO Recurrent UTI's:yes Cystitis: yes  Prostatitis:no Kidney or Bladder Stones: yes Plan: Walk-in Clinic: no Appointment w/Physician: [no Lab visit scheduled for urine drop off: Yes Advice given:  Do you take on daily medications for UTI suppression No

## 2023-11-11 NOTE — Telephone Encounter (Signed)
 Tried calling pt with no answer, message left for return call

## 2023-11-11 NOTE — Telephone Encounter (Signed)
 Patient called into the office today with general questions/concerns regarding Bladder treatment. States she did not urinate until 11pm after the last treatment. Then she was ok, now she is having to urinate every few minutes. Patient may be reached at [phone] to discuss questions.

## 2023-11-12 ENCOUNTER — Telehealth: Payer: Self-pay

## 2023-11-12 LAB — URINALYSIS, ROUTINE W REFLEX MICROSCOPIC
Bilirubin, UA: NEGATIVE
Glucose, UA: NEGATIVE
Ketones, UA: NEGATIVE
Nitrite, UA: NEGATIVE
Protein,UA: NEGATIVE
RBC, UA: NEGATIVE
Specific Gravity, UA: 1.01 (ref 1.005–1.030)
Urobilinogen, Ur: 0.2 mg/dL (ref 0.2–1.0)
pH, UA: 6.5 (ref 5.0–7.5)

## 2023-11-12 LAB — MICROSCOPIC EXAMINATION: Bacteria, UA: NONE SEEN

## 2023-11-12 NOTE — Progress Notes (Signed)
 Patient presents today with complaints of  Frequency, Urgency, and bladder spasm.  UA and Culture done today.  Dr. Sherrilee reviewed results and awaiting urine culture .  Patient aware of MD recommendations and that we will reach out with culture results.      Dyjwpvlj, CMA

## 2023-11-12 NOTE — Telephone Encounter (Signed)
 Patient called to follow up on her culture results.  She was informed that culture results are still pending.  She denies any current uti symptoms other than pain in her lower abdomen.  She did note she has diverticulitis so that could be the cause of the discomfort, she also notes high BP.  Patient aware we will reach out once MD reviews culture.

## 2023-11-13 LAB — URINE CULTURE

## 2023-11-14 NOTE — Telephone Encounter (Signed)
 Verbal from Dr.McKenzie urine culture is negative. Pt is made aware through detailed vm.

## 2023-11-14 NOTE — Telephone Encounter (Signed)
 Patient returned call requesting culture results.   Test was completed on 9/23   Best number to contact patient [phone] Call transferred to clinical basket.

## 2023-11-14 NOTE — Telephone Encounter (Signed)
 Return call to pt making her aware once Dr.McKenzie review urine culture someone will reach out with his recommendation. Verbalized understanding

## 2023-11-15 ENCOUNTER — Other Ambulatory Visit: Payer: Self-pay | Admitting: Gastroenterology

## 2023-11-16 ENCOUNTER — Ambulatory Visit: Payer: Self-pay | Admitting: Urology

## 2023-11-17 ENCOUNTER — Ambulatory Visit: Admitting: Gastroenterology

## 2023-11-17 ENCOUNTER — Encounter: Payer: Self-pay | Admitting: Gastroenterology

## 2023-11-17 VITALS — BP 124/67 | HR 86 | Temp 99.6°F | Ht 64.0 in | Wt 139.8 lb

## 2023-11-17 DIAGNOSIS — K224 Dyskinesia of esophagus: Secondary | ICD-10-CM

## 2023-11-17 DIAGNOSIS — K582 Mixed irritable bowel syndrome: Secondary | ICD-10-CM

## 2023-11-17 DIAGNOSIS — K760 Fatty (change of) liver, not elsewhere classified: Secondary | ICD-10-CM | POA: Diagnosis not present

## 2023-11-17 MED ORDER — CILIDINIUM-CHLORDIAZEPOXIDE 2.5-5 MG PO CAPS: 1.0000 | ORAL_CAPSULE | Freq: Three times a day (TID) | ORAL | 2 refills | Status: DC | PRN

## 2023-11-17 MED ORDER — HYOSCYAMINE SULFATE 0.125 MG SL SUBL: SUBLINGUAL_TABLET | SUBLINGUAL | 2 refills | Status: AC

## 2023-11-17 NOTE — Progress Notes (Signed)
 GI Office Note    Referring Provider: Cook, Jayce G, DO Primary Care Physician:  Cook, Jayce G, DO  Primary Gastroenterologist:Michael Shaaron, MD   Chief Complaint   Chief Complaint  Patient presents with   Diverticulitis    History of Present Illness   Deborah Gray is a 89 y.o. female presenting today for diverticulitis flare. Last seen in 02/2023.    She has history of GERD, esophageal spasms/nutcracker esophagus, idiopathic chronic pancreatitis with chronic pancreatic exocrine insufficiency, IBS with intermittent diarrhea, hemorrhoids. She tries to avoid PPIs due to concerns for causing memory issues. After last EGD, has been on aciphex  20mg  daily. Issues with abdominal pain and concern for bowel obstruction on CT in 02/2022, advised to hold off Levsin  and imodium at that time. Had noted increased use to control bowels around the holidays (01/2022). F/u CT looked good. Suggested she continue levsin  prn but avoid imoidum use. She has had hemorrhoid banding neutrally and left lateral and anterior internal hemorrhoids several months ago.  Discussed the use of AI scribe software for clinical note transcription with the patient, who gave verbal consent.  She experiences ongoing issues with diarrhea, which is unpredictable and sometimes severe. She manages her symptoms with Benefiber and [employer] Probiotic, which have provided some relief, but does have intermittent unpredictable episodes. Recently, she missed her eye exam that she had waiting several months for the appointment. She was very frustrated by this. She had developed postprandial diarrhea after lunch and could not get out of the restroom she took two Levsin  and two imodium to try and stop the stool so she would not be late for her appointment. Unfortunately she missed the appointment.  She was concerned her lower abdominal pain that she had after this episode could be diverticulitis. She went four days without a BM and  finally disimpacted herself. She got relief of her LLQ pain. Still having more of diffuse mild tenderness. Had spontaneous BM yesterday. No melena, brbpr.   She has a history of fatty liver disease but reports no current symptoms or issues related to it. She was concerned because she feels like her right abdomen is more distended the her left. Her liver function tests were normal in June. CT renal stone protocol with normal liver and spleen.   She experiences frequent urination, especially in the mornings, which she attributes to her interstitial cystitis. She needs to urinate multiple times before completing her morning routine.  She has been using hyoscyamine  and Librax for her gastrointestinal symptoms and is seeking refills for these medications. She typically uses hyoscyamine  (Levsin ) for her IBS as needed. She does not take all the time. She uses Librax for esophageal spasms only. She has been on these medications for years and prefers to continue as needed. No noted side effects other than possible constipation.      Prior Data   BPE 02/2023: IMPRESSION: Single contrast esophagram significant for: 1.  Intermittent vestibular penetration without aspiration. 2.  Patulous proximal esophagus. 3. Severe lower esophageal dysmotility with proximal escape waves and contrast stasis in the upper esophagus. Retrograde flow of contrast from the lower esophagus up to the level of the aortic notch.  4. Barium tablet became stuck briefly just inferior to the aortic notch and again at the gastroesophageal junction, suggestive of possible esophageal strictures at these locations.   EGD 04/2023:  -esophagus with distal tandem rings s/p dilation -normal stomach and duodenum  Flex sig 10/2022: -rectal scar s/p bx,  benign -minimal grade 2 hemorrhoids -distal sigmoid diverticulosis  EGD 12/2021: -Distal esophageal rings,2 tandem. Overlying distal esophageal erosion consistent with erosive reflux  esophagitis. Status post dilation.  -Status post esophageal biopsy as described -Inflamed appearing stomach of uncertain significance diffusely status post biopsy normal duodenal bulb and second portion of the duodenum. -Patient not on any meaningful acid suppression therapy. She takes pancreatic enzymes as well. Concerns about dementia risk with PPIs previously. In this setting, the benefits of acid suppression therapy with a PPI far outweighs any theoretical risks of dementia.   Colonoscopy 03/2016: -diverticulosis   Medications   Current Outpatient Medications  Medication Sig Dispense Refill   acetaminophen  (TYLENOL ) 500 MG tablet Take 500 mg by mouth at bedtime.     ascorbic acid (VITAMIN C) 500 MG tablet Take 500 mg by mouth daily.     aspirin  EC 81 MG tablet Take 81 mg by mouth at bedtime.     Carboxymethylcellulose Sodium (THERATEARS) 0.25 % SOLN Place 1 drop into both eyes 2 (two) times daily.     cholecalciferol (VITAMIN D3) 25 MCG (1000 UNIT) tablet Take 1,000 Units by mouth at bedtime.     clidinium-chlordiazePOXIDE  (LIBRAX) 5-2.5 MG capsule Take 1 capsule by mouth 3 (three) times daily as needed (for esophageal symptoms). 90 capsule 1   ezetimibe  (ZETIA ) 10 MG tablet TAKE 1 TABLET BY MOUTH EVERY DAY 90 tablet 3   famotidine  (PEPCID ) 20 MG tablet TAKE 1 TABLET BY MOUTH DAILY WITH SUPPER. 90 tablet 2   Guaifenesin 1200 MG TB12 Take 1,200 mg by mouth daily with supper. (Patient taking differently: Take 1,200 mg by mouth daily as needed.)     hyoscyamine  (LEVSIN  SL) 0.125 MG SL tablet USE 1 TABLET UNDER THE TONGUE BEFORE MEALS AND AT BEDTIME AS NEEDED FOR FLARES IN SYMPTOMS, DIARRHEA/ABDOMINAL CRAMPING 270 tablet 1   ipratropium (ATROVENT ) 0.03 % nasal spray Place 2 sprays into both nostrils every 12 (twelve) hours. 30 mL 12   ketoconazole  (NIZORAL ) 2 % cream Apply 1 Application topically daily as needed.     losartan  (COZAAR ) 100 MG tablet Take 1 tablet (100 mg total) by mouth  daily. 90 tablet 1   meclizine  (ANTIVERT ) 25 MG tablet Take 1 tablet (25 mg total) by mouth 3 (three) times daily as needed. 30 tablet 1   metFORMIN  (GLUCOPHAGE ) 500 MG tablet TAKE ONE TABLET BY MOUTH EVERY MORNING ONE AT LUNCH AND 2 AT BEDTIME 360 tablet 1   methenamine  (HIPREX ) 1 g tablet TAKE 1 TABLET BY MOUTH TWICE A DAY 180 tablet 3   mirabegron  ER (MYRBETRIQ ) 50 MG TB24 tablet Take 1 tablet (50 mg total) by mouth daily. 90 tablet 3   mometasone  (NASONEX ) 50 MCG/ACT nasal spray Place 2 sprays into the nose daily.     Multiple Vitamins-Minerals (PRESERVISION AREDS 2) CAPS Take 1 capsule by mouth 2 (two) times daily.      nateglinide  (STARLIX ) 60 MG tablet Take 1 tablet (60 mg total) by mouth 3 (three) times daily with meals. 270 tablet 3   omega-3 acid ethyl esters (LOVAZA) 1 g capsule Take 2 g by mouth every morning.     Pancrelipase , Lip-Prot-Amyl, (CREON ) 24000-76000 units CPEP TAKE 3 CAPSULES THREE TIMES DAILY WITH MEALS AND ONE CAPSULE WITH SNACK TWICE DAILY 1000 capsule 3   RABEprazole  (ACIPHEX ) 20 MG tablet TAKE 1 TABLET BY MOUTH EVERY DAY 90 tablet 3   triamcinolone  cream (KENALOG ) 0.1 % Apply 1 Application topically 2 (two) times daily.  Current Facility-Administered Medications  Medication Dose Route Frequency Provider Last Rate Last Admin   lidocaine  HCl (PF) (XYLOCAINE ) 2 % injection 10 mL  10 mL Infiltration Once Sherrilee Belvie CROME, MD        Allergies   Allergies as of 11/17/2023 - Review Complete 11/17/2023  Allergen Reaction Noted   Adhesive [tape] Other (See Comments) 07/21/2016   Estrogens Other (See Comments) 10/15/2012   Other Other (See Comments) 09/01/2013   Sulfonamide derivatives Rash 03/01/2008     Review of Systems   General: Negative for anorexia, weight loss, fever, chills, fatigue, weakness. ENT: Negative for hoarseness, difficulty swallowing , nasal congestion. CV: Negative for chest pain, angina, palpitations, dyspnea on exertion, peripheral  edema.  Respiratory: Negative for dyspnea at rest, dyspnea on exertion, cough, sputum, wheezing.  GI: See history of present illness. GU:  Negative for dysuria, hematuria, urinary incontinence, urinary frequency, nocturnal urination.  Endo: Negative for unusual weight change.     Physical Exam   BP 124/67 (BP Location: Right Arm, Patient Position: Sitting, Cuff Size: Normal)   Pulse 86   Temp 99.6 F (37.6 C) (Oral)   Ht 5' 4 (1.626 m)   Wt 139 lb 12.8 oz (63.4 kg)   SpO2 96%   BMI 24.00 kg/m    General: very pleasant, Well-nourished, well-developed in no acute distress.  Eyes: No icterus. Mouth: Oropharyngeal mucosa moist and pink   Abdomen: Bowel sounds are normal, nontender, nondistended, no hepatosplenomegaly or masses,  no abdominal bruits or hernia , no rebound or guarding.  Rectal: not performed Extremities: trace bilateral lower extremity edema. No clubbing or deformities. Neuro: Alert and oriented x 4   Skin: Warm and dry, no jaundice.   Psych: Alert and cooperative, normal mood and affect.  Labs   Lab Results  Component Value Date   NA 145 (H) 07/28/2023   CL 105 07/28/2023   K 5.1 07/28/2023   CO2 21 07/28/2023   BUN 25 07/28/2023   CREATININE 0.79 07/28/2023   EGFR 72 07/28/2023   CALCIUM 10.0 07/28/2023   PHOS 2.7 03/16/2021   ALBUMIN 4.5 07/28/2023   GLUCOSE 116 (H) 07/28/2023   Lab Results  Component Value Date   ALT 15 07/28/2023   AST 15 07/28/2023   ALKPHOS 101 07/28/2023   BILITOT 0.4 07/28/2023   Lab Results  Component Value Date   WBC 6.9 07/28/2023   HGB 12.6 07/28/2023   HCT 39.7 07/28/2023   MCV 92 07/28/2023   PLT 227 07/28/2023   Lab Results  Component Value Date   HGBA1C 6.1 (H) 07/28/2023    Imaging Studies   No results found.  Assessment/Plan:    Irritable bowel syndrome  Chronic IBS with alternating symptoms. Had been doing well with use of [employer] Probiotics and Benefiber. Continues to have some  intermittent episodes of postprandial urgency/loose stools. Recently this caused her to miss and appointment she had waited for several months. She developed constipation/impaction after taking two hyoscyamine  and two imodium. Likely the source of her LLQ pain. Symptoms essentially resolved and benign abdominal exam today.  -monitor for recurrent symptoms -RX for hyoscyamine  sublingual tablets .125mg  to have on hand for prn use. - Advise to avoid taking more than one Imodium at a time if possible -avoid trigger foods.    Fatty liver disease Normal LFTs. CT renal stone study with unremarkable liver/spleen. Doubt advanced disease.  -Continue low-fat/low-carb diet.  -FIB 4 1.5, excludes advanced fibrosis  Esophageal spasms/nutcracker  esophagus: Doing well. Requested Librax to keep on hand. She has used for many years as needed with good results.   Return ov as needed.    Sonny RAMAN. Ezzard, MHS, PA-C [facility]

## 2023-11-17 NOTE — Patient Instructions (Signed)
 Please monitor your abdominal pain over the next several days. If you notice worsening pain, we could consider treating you with antibiotics but based on your exam and presentation of your symptoms, I do not suspect diverticulitis at this time. I suspect your symptoms were due to constipation/impaction.  I have refilled your Levsin  and Librax today.  Call if you have any concerns. Return office visit as needed.

## 2023-11-19 ENCOUNTER — Encounter: Payer: Self-pay | Admitting: Family Medicine

## 2023-11-19 ENCOUNTER — Ambulatory Visit: Admitting: Family Medicine

## 2023-11-19 VITALS — BP 145/66 | HR 84 | Ht 64.0 in | Wt 138.0 lb

## 2023-11-19 DIAGNOSIS — I10 Essential (primary) hypertension: Secondary | ICD-10-CM

## 2023-11-19 DIAGNOSIS — Z23 Encounter for immunization: Secondary | ICD-10-CM

## 2023-11-19 NOTE — Patient Instructions (Signed)
Follow up in 6 months.  Take care  Dr. Wren Gallaga  

## 2023-11-20 NOTE — Progress Notes (Signed)
 Subjective:  Patient ID: Deborah Gray, female    DOB: Jul 03, 1933  Age: 89 y.o. MRN: [id number]  CC:  Follow up hypertension  HPI:  89 year old female presents for follow up.  HTN improved. BP mildly elevated here. Fair control given advanced age. She is feeling well.  No complaints at this time.  Patient Active Problem List   Diagnosis Date Noted   Fatty liver 11/17/2023   Lower extremity edema 11/04/2023   Urge incontinence 08/04/2023   Upper airway cough syndrome 12/26/2022   Interstitial cystitis 10/24/2022   History of recurrent UTIs 10/24/2022   Bilateral renal cysts 10/24/2022   Osteoporosis 07/05/2022   Multiple pulmonary nodules determined by computed tomography of lung 03/21/2022   Nephrolithiasis 03/15/2021   Prolapsed internal hemorrhoids, grade 3 01/23/2021   Spinal stenosis of lumbar region 12/02/2017   Osteoarthritis of left knee 12/02/2017   Degeneration of lumbar intervertebral disc 11/18/2017   IBS (irritable bowel syndrome) 01/30/2015   Type 2 diabetes mellitus (HCC) 12/22/2014   Hyperlipidemia, unspecified 12/22/2014   Essential hypertension 11/25/2013   Chronic pancreatitis (HCC) 02/26/2011   Gastroesophageal reflux disease 01/18/2011    Social Hx   Social History   Socioeconomic History   Marital status: Widowed    Spouse name: Not on file   Number of children: 3   Years of education: Not on file   Highest education level: Not on file  Occupational History   Occupation: Retired  Tobacco Use   Smoking status: Never   Smokeless tobacco: Never   Tobacco comments:    Never smoked  Vaping Use   Vaping status: Never Used  Substance and Sexual Activity   Alcohol use: No    Alcohol/week: 0.0 standard drinks of alcohol   Drug use: No   Sexual activity: Not Currently  Other Topics Concern   Not on file  Social History Narrative   Widowed since 07/2018. Married x 61 years.   3 sons. 82, 1963 and 1966.   Social Drivers of Manufacturing engineer Strain: Low Risk  (05/31/2022)   Overall Financial Resource Strain (CARDIA)    Difficulty of Paying Living Expenses: Not hard at all  Food Insecurity: No Food Insecurity (05/31/2022)   Hunger Vital Sign    Worried About Running Out of Food in the Last Year: Never true    Ran Out of Food in the Last Year: Never true  Transportation Needs: No Transportation Needs (05/31/2022)   PRAPARE - Administrator, Civil Service (Medical): No    Lack of Transportation (Non-Medical): No  Physical Activity: Sufficiently Active (05/31/2022)   Exercise Vital Sign    Days of Exercise per Week: 5 days    Minutes of Exercise per Session: 30 min  Stress: No Stress Concern Present (05/31/2022)   [employer] of Occupational Health - Occupational Stress Questionnaire    Feeling of Stress : Not at all  Social Connections: Moderately Integrated (05/31/2022)   Social Connection and Isolation Panel    Frequency of Communication with Friends and Family: More than three times a week    Frequency of Social Gatherings with Friends and Family: More than three times a week    Attends Religious Services: More than 4 times per year    Active Member of [employer] or Organizations: Yes    Attends Banker Meetings: More than 4 times per year    Marital Status: Widowed    Review of Systems  Per HPI  Objective:  BP (!) 145/66   Pulse 84   Ht 5' 4 (1.626 m)   Wt 138 lb (62.6 kg)   BMI 23.69 kg/m      11/19/2023    3:12 PM 11/17/2023    3:24 PM 11/17/2023    3:19 PM  BP/Weight  Systolic BP 145 124 165  Diastolic BP 66 67 73  Wt. (Lbs) 138  139.8  BMI 23.69 kg/m2  24 kg/m2    Physical Exam Vitals and nursing note reviewed.  Constitutional:      General: She is not in acute distress.    Appearance: Normal appearance.  HENT:     Head: Normocephalic and atraumatic.  Cardiovascular:     Rate and Rhythm: Normal rate and regular rhythm.  Pulmonary:     Effort:  Pulmonary effort is normal.     Breath sounds: Normal breath sounds.  Neurological:     Mental Status: She is alert.  Psychiatric:        Mood and Affect: Mood normal.        Behavior: Behavior normal.     Lab Results  Component Value Date   WBC 6.9 07/28/2023   HGB 12.6 07/28/2023   HCT 39.7 07/28/2023   PLT 227 07/28/2023   GLUCOSE 116 (H) 07/28/2023   CHOL 166 07/28/2023   TRIG 52 07/28/2023   HDL 68 07/28/2023   LDLCALC 87 07/28/2023   ALT 15 07/28/2023   AST 15 07/28/2023   NA 145 (H) 07/28/2023   K 5.1 07/28/2023   CL 105 07/28/2023   CREATININE 0.79 07/28/2023   BUN 25 07/28/2023   CO2 21 07/28/2023   TSH 2.720 11/19/2021   INR 1.7 (H) 04/08/2008   HGBA1C 6.1 (H) 07/28/2023   MICROALBUR 1.0 11/22/2013     Assessment & Plan:  Essential hypertension Assessment & Plan: Improved. Continue current dose of Losartan . BMP ordered.  Orders: -     Basic metabolic panel with GFR  Need for influenza vaccination -     Flu vaccine HIGH DOSE PF(Fluzone Trivalent)    Follow-up:  6 months  Cristian Grieves Bluford DO [facility]

## 2023-11-20 NOTE — Assessment & Plan Note (Addendum)
 Improved. Continue current dose of Losartan . BMP ordered.

## 2023-11-21 ENCOUNTER — Telehealth: Payer: Self-pay | Admitting: *Deleted

## 2023-11-21 NOTE — Telephone Encounter (Signed)
 Cook, Jayce G, DO I forgot to get patient to get lab drawn given increase in Losartan . BMP has been ordered. Please advise patient to get lab drawn.  Thank you  JC DO

## 2023-11-21 NOTE — Telephone Encounter (Signed)
 Patient has been informed regarding lab orders.

## 2023-11-25 LAB — BASIC METABOLIC PANEL WITH GFR
BUN/Creatinine Ratio: 26 (ref 12–28)
BUN: 19 mg/dL (ref 8–27)
CO2: 24 mmol/L (ref 20–29)
Calcium: 10 mg/dL (ref 8.7–10.3)
Chloride: 102 mmol/L (ref 96–106)
Creatinine, Ser: 0.74 mg/dL (ref 0.57–1.00)
Glucose: 123 mg/dL — ABNORMAL HIGH (ref 70–99)
Potassium: 5.1 mmol/L (ref 3.5–5.2)
Sodium: 141 mmol/L (ref 134–144)
eGFR: 78 mL/min/1.73 (ref >59)

## 2023-12-01 ENCOUNTER — Telehealth: Payer: Self-pay

## 2023-12-01 NOTE — Telephone Encounter (Signed)
 Return call to pt to confirm appointment. Pt confirmed appointment.

## 2023-12-07 ENCOUNTER — Other Ambulatory Visit: Payer: Self-pay | Admitting: Gastroenterology

## 2023-12-08 ENCOUNTER — Ambulatory Visit

## 2023-12-08 DIAGNOSIS — N3010 Interstitial cystitis (chronic) without hematuria: Secondary | ICD-10-CM | POA: Diagnosis not present

## 2023-12-08 MED ORDER — LIDOCAINE HCL 2 % IJ SOLN
10.0000 mL | Freq: Once | INTRAMUSCULAR | Status: AC
  Administered 2023-12-08: 200 mg

## 2023-12-08 MED ORDER — SODIUM BICARBONATE 8.4 % IV SOLN
50.0000 meq | Freq: Once | INTRAVENOUS | Status: AC
  Administered 2023-12-08: 50 meq

## 2023-12-08 MED ORDER — DIMETHYL SULFOXIDE 50 % IS SOLN
50.0000 mL | Freq: Once | INTRAVESICAL | Status: AC
  Administered 2023-12-08: 50 mL via URETHRAL

## 2023-12-08 MED ORDER — HYDROCORTISONE SOD SUC (PF) 100 MG IJ SOLR
100.0000 mg | Freq: Once | INTRAMUSCULAR | Status: AC
  Administered 2023-12-08: 100 mg

## 2023-12-08 NOTE — Progress Notes (Cosign Needed Addendum)
 Bladder Instillation DMSO  Due to Interstitial Cystitis patient is present today for a Bladder Instillation of DMSO treatment. Patient was cleaned and prepped in a sterile fashion with Betadinex3.  A 20 FR catheter was inserted, urine return was noted 100 ml, urine was Clear yellow In color.  DMSO treatment was instilled into the bladder. The catheter was then removed. Patient tolerated well, no complications were noted pt was instructed to hold the instillation for 20 minutes and then will void it out. Pt voiced understanding.    Preformed by: Carlos, CMA  Follow up/ Additional notes: Keep monthly DMSO

## 2023-12-09 LAB — MICROSCOPIC EXAMINATION: Bacteria, UA: NONE SEEN

## 2023-12-09 LAB — URINALYSIS, COMPLETE
Bilirubin, UA: NEGATIVE
Glucose, UA: NEGATIVE
Ketones, UA: NEGATIVE
Nitrite, UA: NEGATIVE
Protein,UA: NEGATIVE
Specific Gravity, UA: 1.01 (ref 1.005–1.030)
Urobilinogen, Ur: 0.2 mg/dL (ref 0.2–1.0)
pH, UA: 6 (ref 5.0–7.5)

## 2023-12-10 ENCOUNTER — Telehealth: Payer: Self-pay

## 2023-12-10 NOTE — Telephone Encounter (Signed)
 Pt lab results are in - related to losartan  increase, please advise     Copied from CRM [id number]. Topic: Clinical - Lab/Test Results >> Dec 10, 2023  9:50 AM Tonda B wrote: Reason for CRM: patient is calling to go over her test results please call pt back [phone]

## 2023-12-14 ENCOUNTER — Other Ambulatory Visit: Payer: Self-pay | Admitting: Gastroenterology

## 2023-12-15 ENCOUNTER — Telehealth (INDEPENDENT_AMBULATORY_CARE_PROVIDER_SITE_OTHER): Payer: Self-pay

## 2023-12-15 NOTE — Telephone Encounter (Signed)
 Left vm to r/s 04/13/24 appointments

## 2023-12-17 NOTE — Telephone Encounter (Signed)
 Left a message for pt to return call

## 2023-12-22 ENCOUNTER — Telehealth: Payer: Self-pay

## 2023-12-22 ENCOUNTER — Telehealth: Payer: Self-pay | Admitting: Internal Medicine

## 2023-12-22 ENCOUNTER — Other Ambulatory Visit

## 2023-12-22 DIAGNOSIS — R300 Dysuria: Secondary | ICD-10-CM

## 2023-12-22 LAB — URINALYSIS, ROUTINE W REFLEX MICROSCOPIC
Bilirubin, UA: NEGATIVE
Glucose, UA: NEGATIVE
Ketones, UA: NEGATIVE
Nitrite, UA: POSITIVE — AB
Specific Gravity, UA: 1.01 (ref 1.005–1.030)
Urobilinogen, Ur: 0.2 mg/dL (ref 0.2–1.0)
pH, UA: 6 (ref 5.0–7.5)

## 2023-12-22 LAB — MICROSCOPIC EXAMINATION: WBC, UA: >30 /HPF — AB (ref 0–5)

## 2023-12-22 NOTE — Telephone Encounter (Signed)
 Patient left a voice message 12-22-2023.  Patient has been experiencing frequent, painful urination with incontinence. Asking to leave a urine specimen for analysis.  Please advise.  Call:  [phone]

## 2023-12-22 NOTE — Telephone Encounter (Signed)
 Dysuria  Patient called with c/o dysuria x 2-3 days days.  Pain: pain when bladder is full  uncomfortable Severity:5/10  Associated Signs and Symptoms:  Fever: no Chills: no Hematuria: no Urgency: yes Frequency: yes Hesitancy:at times  Incontinence: yes Nausea: no Vomiting: no  Urologic History:  Any Recent Urologic Surgeries or Procedures:no Recurrent UTI's:yes Cystitis: yes  Prostatitis:no Kidney or Bladder Stones: yes Plan: Walk-in Clinic: no Appointment w/Physician: [no Lab visit scheduled for urine drop off: Yes Advice given: Pt scheduled for UA drop off  Do you take on daily medications for UTI suppression No

## 2023-12-22 NOTE — Telephone Encounter (Unsigned)
 Copied from CRM #[id number]. Topic: Referral - Question >> Dec 19, 2023  3:04 PM Corean SAUNDERS wrote: Reason for CRM: Patient states that Dr. Darlean wanted to her to see a specific female provider at her ENT office but that provider is no longer practicing there and patient would like to know if there is someone else that Dr. Darlean would recommend for her.

## 2023-12-23 MED ORDER — NITROFURANTOIN MONOHYD MACRO 100 MG PO CAPS: 100.0000 mg | ORAL_CAPSULE | Freq: Two times a day (BID) | ORAL | 0 refills | Status: AC

## 2023-12-23 NOTE — Addendum Note (Signed)
 Addended by: MALACHY SLICE on: 12/23/2023 02:28 PM   Modules accepted: Orders

## 2023-12-23 NOTE — Telephone Encounter (Signed)
 Patient called and made of Macrobid  sent to pharmacy due to urinalysis per Dr. Sherrilee.   Urine culture still pending.

## 2023-12-25 LAB — URINE CULTURE

## 2023-12-26 ENCOUNTER — Ambulatory Visit: Payer: Self-pay

## 2023-12-26 NOTE — Telephone Encounter (Signed)
 Patient states that Dr. Darlean wanted to her to see a specific female provider at her ENT office but that provider is no longer practicing there and patient would like to know if there is someone else that Dr. Darlean would recommend for her.        Please advise

## 2023-12-26 NOTE — Telephone Encounter (Signed)
 Called and relayed dr wert irby to pt - confirmed understanding

## 2023-12-30 ENCOUNTER — Telehealth: Payer: Self-pay

## 2023-12-30 NOTE — Telephone Encounter (Signed)
 Pt was made aware and verbalized understanding.

## 2023-12-30 NOTE — Telephone Encounter (Signed)
 Most common side effects with famotidine  in elderly is headache, dizziness, constipation, diarrhea. Elderly are at increased risk of confusion especially if they have kidney impairment (her kidney function is good).  We added famotidine  about a year ago. As long as she is taking her aciphex , I am ok with her stopping famotidine .

## 2023-12-30 NOTE — Telephone Encounter (Signed)
 Pt called stating that when she went to the pharmacy to pick up her famotidine  she was handed a handout regarding possible side effects in patients that are older. One of the side effects was forgetfulness and pt states that she has been having episodes of forgetfulness here lately and is wondering if it could be coming from the famotidine .

## 2024-01-01 ENCOUNTER — Telehealth: Payer: Self-pay | Admitting: Urology

## 2024-01-01 NOTE — Telephone Encounter (Signed)
 Patient left message that she needs to bring a urine specimen she is not feeling good and cannot make it to Tuesday for her treatment

## 2024-01-02 NOTE — Telephone Encounter (Signed)
 Return called to pt. Pt state's she is having bladder spasm, frequency, Urgency, and incontinent. Pt state's she just completed Macrobid  a week ago and she is experiencing the same symptoms as before. Pt was made aware she was put on the correct antibotic according to her culture. Pt state's she is taking Azo for some relief until her scheduled appointment on Tuesday. Pt was offer an appointment and  Pt state's she will wait until her scheduled appointment on Tuesday to have urine check. Pt will continued AZO.

## 2024-01-06 ENCOUNTER — Other Ambulatory Visit: Payer: Self-pay | Admitting: Gastroenterology

## 2024-01-06 ENCOUNTER — Ambulatory Visit (INDEPENDENT_AMBULATORY_CARE_PROVIDER_SITE_OTHER)

## 2024-01-06 DIAGNOSIS — N3010 Interstitial cystitis (chronic) without hematuria: Secondary | ICD-10-CM | POA: Diagnosis not present

## 2024-01-06 NOTE — Progress Notes (Signed)
 Pt came into for DMSO treatment and urinalysis showed infection and urine culture order. Pending culture for appropriated treatment. No DMSO treatment today, will rescheduled pt appointment.

## 2024-01-07 ENCOUNTER — Telehealth: Payer: Self-pay | Admitting: Urology

## 2024-01-07 ENCOUNTER — Other Ambulatory Visit: Payer: Self-pay

## 2024-01-07 ENCOUNTER — Telehealth: Payer: Self-pay

## 2024-01-07 DIAGNOSIS — R300 Dysuria: Secondary | ICD-10-CM

## 2024-01-07 LAB — URINALYSIS, ROUTINE W REFLEX MICROSCOPIC
Bilirubin, UA: NEGATIVE
Glucose, UA: NEGATIVE
Ketones, UA: NEGATIVE
Nitrite, UA: POSITIVE — AB
Specific Gravity, UA: 1.01 (ref 1.005–1.030)
Urobilinogen, Ur: 0.2 mg/dL (ref 0.2–1.0)
pH, UA: 5.5 (ref 5.0–7.5)

## 2024-01-07 LAB — MICROSCOPIC EXAMINATION: WBC, UA: >30 /HPF — AB (ref 0–5)

## 2024-01-07 MED ORDER — CEFPODOXIME PROXETIL 200 MG PO TABS
200.0000 mg | ORAL_TABLET | Freq: Two times a day (BID) | ORAL | 0 refills | Status: AC
Start: 2024-01-07 — End: ?

## 2024-01-07 NOTE — Telephone Encounter (Signed)
 Patient state's she want to wait on having Renal U/S.    Patient presents today with complaints of  Burning with urination.  UA and Culture done today.  Dr. Sherrilee reviewed results and Vantin 200 mg BIB X 7 days .  Patient aware of MD recommendations and that we will reach out with culture results.      Dyjwpvlj, CMA

## 2024-01-07 NOTE — Telephone Encounter (Signed)
 Deborah Gray

## 2024-01-10 LAB — URINE CULTURE

## 2024-01-12 ENCOUNTER — Telehealth: Payer: Self-pay

## 2024-01-12 ENCOUNTER — Ambulatory Visit (INDEPENDENT_AMBULATORY_CARE_PROVIDER_SITE_OTHER)

## 2024-01-12 DIAGNOSIS — N3010 Interstitial cystitis (chronic) without hematuria: Secondary | ICD-10-CM

## 2024-01-12 DIAGNOSIS — R300 Dysuria: Secondary | ICD-10-CM

## 2024-01-12 MED ORDER — DIMETHYL SULFOXIDE 50 % IS SOLN
50.0000 mL | Freq: Once | INTRAVESICAL | Status: AC
  Administered 2024-01-12: 50 mL via URETHRAL

## 2024-01-12 MED ORDER — LIDOCAINE HCL 2 % IJ SOLN
10.0000 mL | Freq: Once | INTRAMUSCULAR | Status: AC
  Administered 2024-01-12: 200 mg

## 2024-01-12 MED ORDER — HYDROCORTISONE SOD SUC (PF) 100 MG IJ SOLR
100.0000 mg | Freq: Once | INTRAMUSCULAR | Status: AC
  Administered 2024-01-12: 100 mg

## 2024-01-12 MED ORDER — FLUCONAZOLE 150 MG PO TABS: ORAL_TABLET | ORAL | 0 refills | Status: AC

## 2024-01-12 MED ORDER — SODIUM BICARBONATE 8.4 % IV SOLN
50.0000 meq | Freq: Once | INTRAVENOUS | Status: AC
  Administered 2024-01-12: 50 meq

## 2024-01-12 NOTE — Telephone Encounter (Signed)
 Pt called in today with bladder problem. Bladder pressure, pain when bladder is full and incontinence. Verbal from Dr. Sherrilee to put pt on scheduled for DMSO treatment.

## 2024-01-12 NOTE — Progress Notes (Cosign Needed Addendum)
 Bladder Instillation DMSO  Due to Interstitial cystitis  patient is present today for a Bladder Instillation of DMSO treatment. Patient was cleaned and prepped in a sterile fashion with Betadinex3.  A 20 FR catheter was inserted, urine return was noted 200 ml, urine was Clear yellow In color.  DMSO treatment was instilled into the bladder. The catheter was then removed. Patient tolerated well, no complications were noted pt was instructed to hold the instillation for 20 minutes and then will void it out. Pt voiced understanding.    Preformed by: Carlos, CMA  Follow up/ Additional notes: Keep monthly DMSO

## 2024-01-13 LAB — URINALYSIS, ROUTINE W REFLEX MICROSCOPIC
Bilirubin, UA: NEGATIVE
Glucose, UA: NEGATIVE
Ketones, UA: NEGATIVE
Nitrite, UA: NEGATIVE
Protein,UA: NEGATIVE
RBC, UA: NEGATIVE
Specific Gravity, UA: 1.01 (ref 1.005–1.030)
Urobilinogen, Ur: 0.2 mg/dL (ref 0.2–1.0)
pH, UA: 6 (ref 5.0–7.5)

## 2024-01-13 LAB — MICROSCOPIC EXAMINATION: Bacteria, UA: NONE SEEN

## 2024-01-13 NOTE — Addendum Note (Signed)
 Addended by: GRETTA MASTERS R on: 01/13/2024 05:00 PM   Modules accepted: Orders

## 2024-01-16 ENCOUNTER — Ambulatory Visit (HOSPITAL_COMMUNITY)
Admission: RE | Admit: 2024-01-16 | Discharge: 2024-01-16 | Disposition: A | Source: Ambulatory Visit | Attending: Urology

## 2024-01-16 DIAGNOSIS — N200 Calculus of kidney: Secondary | ICD-10-CM | POA: Insufficient documentation

## 2024-01-21 ENCOUNTER — Ambulatory Visit: Admitting: Urology

## 2024-01-21 VITALS — BP 132/60 | HR 82

## 2024-01-21 DIAGNOSIS — N3010 Interstitial cystitis (chronic) without hematuria: Secondary | ICD-10-CM

## 2024-01-21 DIAGNOSIS — R3915 Urgency of urination: Secondary | ICD-10-CM

## 2024-01-21 DIAGNOSIS — N3941 Urge incontinence: Secondary | ICD-10-CM

## 2024-01-21 DIAGNOSIS — N200 Calculus of kidney: Secondary | ICD-10-CM

## 2024-01-21 DIAGNOSIS — R350 Frequency of micturition: Secondary | ICD-10-CM

## 2024-01-21 MED ORDER — MIRABEGRON ER 50 MG PO TB24: 50.0000 mg | ORAL_TABLET | Freq: Every day | ORAL | 3 refills | Status: AC

## 2024-01-21 NOTE — Progress Notes (Signed)
 01/21/2024 12:05 PM   Deborah  R Gray [birth year] [id number]  Referring provider: Sherrilee Belvie CROME, MD [address]  [address] [place],  [place] [address]  Followup nephrolithiasis and IC   HPI: Deborah Gray is a 89yo here for followup for nephrolithiasis and IC. Renal US  11/28 shows left hydronephrosis. She has increased bladder pain and had an instillation last week. She takes mirabegron  50mg  which significantly improves her urinary urgency and urge incontinence. She has increased fatigue in the past month. She has had 4 UTIs since last visit. She denies any dysuria currently.    PMH: Past Medical History:  Diagnosis Date   Arthritis    Benign neoplasm of colon    Constipation    Diaphragmatic hernia without mention of obstruction or gangrene    Diverticulosis of colon (without mention of hemorrhage)    Dysphagia, pharyngoesophageal phase    Esophageal reflux    Essential hypertension    Gastritis    Heart murmur    Hemorrhoids    Hyperlipidemia    Internal hemorrhoids without mention of complication    Interstitial cystitis    Left wrist fracture    PONV (postoperative nausea and vomiting)    PSVT (paroxysmal supraventricular tachycardia)    Stricture and stenosis of esophagus    Type 2 diabetes mellitus (HCC)    Urinary frequency 08/04/2023    Surgical History: Past Surgical History:  Procedure Laterality Date   ABDOMINAL HYSTERECTOMY     BACTERIAL OVERGROWTH TEST N/A 09/06/2015   Procedure: BACTERIAL OVERGROWTH TEST;  Surgeon: Lamar CHRISTELLA Hollingshead, MD;  [facility];  Service: Endoscopy;  Laterality: N/A;  800   BIOPSY  01/02/2022   Procedure: BIOPSY;  Surgeon: Hollingshead Lamar CHRISTELLA, MD;  [facility]: [facility];  Service: Endoscopy;;   BIOPSY  11/18/2022   Procedure: BIOPSY;  Surgeon: Hollingshead Lamar CHRISTELLA, MD;  [facility];  Service: Endoscopy;;   BREAST LUMPECTOMY Right    benign   CATARACT EXTRACTION Bilateral 2006   Implants in both,  surgeries done 6 weeks apart   CHOLECYSTECTOMY     COLONOSCOPY  03/08/02   Jakie: diverticulosis, internal hemorrhoids, adenomatous colon polyp   COLONOSCOPY  01/23/04   Jakie: diverticulosis, internal hemorrhoids   COLONOSCOPY  09/25/05   Jakie: diverticulosis   COLONOSCOPY  07/05/09   Jakie: severe diverticulosis   COLONOSCOPY  01/21/11   Jakie: severe diverticulosis in sigmoid to desc colon, int hemorrhoids, follow up TCS in 5 years   COLONOSCOPY N/A 04/10/2016   [name]: diverticulosis   CYSTOSCOPY W/ URETERAL STENT PLACEMENT Right 03/15/2021   Procedure: CYSTOSCOPY WITH RETROGRADE PYELOGRAM/URETERAL STENT PLACEMENT;  Surgeon: Selma Donnice SAUNDERS, MD;  Location: [facility];  Service: Urology;  Laterality: Right;   CYSTOSCOPY WITH RETROGRADE PYELOGRAM, URETEROSCOPY AND STENT PLACEMENT Right 03/29/2021   Procedure: CYSTOSCOPY WITH RETROGRADE PYELOGRAM, URETEROSCOPY AND STENT EXCHANGE;  Surgeon: Sherrilee Belvie CROME, MD;  Location: [facility];  Service: Urology;  Laterality: Right;   DILATION AND CURETTAGE OF UTERUS     ESOPHAGEAL MANOMETRY  03/21/08   Patterson: findings c/w [place] Esophagus   ESOPHAGOGASTRODUODENOSCOPY  03/08/02   Jakie: esophageal stricture, chronic gerd, s/p dilation.    ESOPHAGOGASTRODUODENOSCOPY  01/23/04   Patterson: esophageal stricture, gastritis, hiatal hernia   ESOPHAGOGASTRODUODENOSCOPY  09/25/05   Jakie: gastritis, benign bx, no H.pylori   ESOPHAGOGASTRODUODENOSCOPY  07/05/09   Jakie: gastropathy, benign small bowel bx and gastric bx   ESOPHAGOGASTRODUODENOSCOPY N/A 05/15/2015   Dr. Hollingshead- diffuse moderate  inflammation characterized by congestion (edema), erythema, and linear erosions was found in the entire examined stomach. bx= reactive gastropathy   ESOPHAGOGASTRODUODENOSCOPY (EGD) WITH PROPOFOL  N/A 01/02/2022   Procedure: ESOPHAGOGASTRODUODENOSCOPY (EGD) WITH PROPOFOL ;  Surgeon: Shaaron Lamar HERO, MD;  [facility];  Service: Endoscopy;   Laterality: N/A;  10:30am, asa 2   ESOPHAGOGASTRODUODENOSCOPY (EGD) WITH PROPOFOL  N/A 04/23/2023   Procedure: ESOPHAGOGASTRODUODENOSCOPY (EGD) WITH PROPOFOL ;  Surgeon: Shaaron Lamar HERO, MD;  [facility];  Service: Endoscopy;  Laterality: N/A;  1130AM, ASA 3   FLEXIBLE SIGMOIDOSCOPY N/A 11/18/2022   Procedure: FLEXIBLE SIGMOIDOSCOPY;  Surgeon: Shaaron Lamar HERO, MD;  [facility];  Service: Endoscopy;  Laterality: N/A;  10:00 am, asa 2   FOOT SURGERY Left    HEMORRHOID BANDING  2017   Dr.[name]   HOLMIUM LASER APPLICATION Right 03/29/2021   Procedure: HOLMIUM LASER APPLICATION;  Surgeon: Sherrilee Belvie CROME, MD;  Location: [facility];  Service: Urology;  Laterality: Right;   LAPAROSCOPIC VAGINAL HYSTERECTOMY     MALONEY DILATION N/A 01/02/2022   Procedure: AGAPITO DILATION;  Surgeon: Shaaron Lamar HERO, MD;  [facility];  Service: Endoscopy;  Laterality: N/A;   MALONEY DILATION N/A 04/23/2023   Procedure: DILATION, ESOPHAGUS, USING MALONEY DILATOR;  Surgeon: Shaaron Lamar HERO, MD;  [facility];  Service: Endoscopy;  Laterality: N/A;   ORIF WRIST FRACTURE Left 06/23/2018   Procedure: OPEN REDUCTION INTERNAL FIXATION (ORIF) LEFT WRIST FRACTURE;  Surgeon: Beverley Evalene BIRCH, MD;  Location: [facility];  Service: Orthopedics;  Laterality: Left;  regional arm block   RECTOCELE REPAIR     TOTAL KNEE ARTHROPLASTY Right     Home Medications:  Allergies as of 01/21/2024       Reactions   Adhesive [tape] Other (See Comments)    Blisters    Estrogens Other (See Comments)   Migraines    Other Other (See Comments)   Sulfonamide Derivatives Rash        Medication List        Accurate as of January 21, 2024 12:05 PM. If you have any questions, ask your nurse or doctor.          acetaminophen  500 MG tablet Commonly known as: TYLENOL  Take 500 mg by mouth at bedtime.   ascorbic acid 500 MG tablet Commonly known as: VITAMIN C Take 500 mg by mouth  daily.   aspirin  EC 81 MG tablet Take 81 mg by mouth at bedtime.   cefpodoxime  200 MG tablet Commonly known as: VANTIN  Take 1 tablet (200 mg total) by mouth 2 (two) times daily.   cholecalciferol 25 MCG (1000 UNIT) tablet Commonly known as: VITAMIN D3 Take 1,000 Units by mouth at bedtime.   clidinium-chlordiazePOXIDE  5-2.5 MG capsule Commonly known as: LIBRAX TAKE 1 CAPSULE BY MOUTH 3 (THREE) TIMES DAILY AS NEEDED (FOR ESOPHAGEAL SYMPTOMS).   Creon  24000-76000 units Cpep Generic drug: Pancrelipase  (Lip-Prot-Amyl) TAKE 3 CAPSULES THREE TIMES DAILY WITH MEALS AND ONE CAPSULE WITH SNACK TWICE DAILY   ezetimibe  10 MG tablet Commonly known as: ZETIA  TAKE 1 TABLET BY MOUTH EVERY DAY   famotidine  20 MG tablet Commonly known as: PEPCID  TAKE 1 TABLET BY MOUTH DAILY WITH SUPPER.   fluconazole  150 MG tablet Commonly known as: DIFLUCAN  Take once. May repeat in 3 days if symptoms fail to fully resolve after first dose.   Guaifenesin 1200 MG Tb12 Take 1,200 mg by mouth daily with supper. What changed:  when to  take this reasons to take this   hyoscyamine  0.125 MG SL tablet Commonly known as: LEVSIN  SL USE 1 to 2 TABLETS UNDER THE TONGUE BEFORE MEALS AND AT BEDTIME AS NEEDED FOR FLARES IN SYMPTOMS, DIARRHEA/ABDOMINAL CRAMPING   ipratropium 0.03 % nasal spray Commonly known as: ATROVENT  Place 2 sprays into both nostrils every 12 (twelve) hours.   ketoconazole  2 % cream Commonly known as: NIZORAL  Apply 1 Application topically daily as needed.   losartan  100 MG tablet Commonly known as: COZAAR  Take 1 tablet (100 mg total) by mouth daily.   meclizine  25 MG tablet Commonly known as: ANTIVERT  Take 1 tablet (25 mg total) by mouth 3 (three) times daily as needed.   metFORMIN  500 MG tablet Commonly known as: GLUCOPHAGE  TAKE ONE TABLET BY MOUTH EVERY MORNING ONE AT LUNCH AND 2 AT BEDTIME   methenamine  1 g tablet Commonly known as: HIPREX  TAKE 1 TABLET BY MOUTH TWICE A DAY    mirabegron  ER 50 MG Tb24 tablet Commonly known as: Myrbetriq  Take 1 tablet (50 mg total) by mouth daily.   mometasone  50 MCG/ACT nasal spray Commonly known as: NASONEX  Place 2 sprays into the nose daily.   nateglinide  60 MG tablet Commonly known as: STARLIX  Take 1 tablet (60 mg total) by mouth 3 (three) times daily with meals.   nitrofurantoin  (macrocrystal-monohydrate) 100 MG capsule Commonly known as: MACROBID  Take 1 capsule (100 mg total) by mouth 2 (two) times daily.   omega-3 acid ethyl esters 1 g capsule Commonly known as: LOVAZA Take 2 g by mouth every morning.   PreserVision AREDS 2 Caps Take 1 capsule by mouth 2 (two) times daily.   RABEprazole  20 MG tablet Commonly known as: ACIPHEX  TAKE 1 TABLET BY MOUTH EVERY DAY   Theratears 0.25 % Soln Generic drug: Carboxymethylcellulose Sodium Place 1 drop into both eyes 2 (two) times daily.   triamcinolone  cream 0.1 % Commonly known as: KENALOG  Apply 1 Application topically 2 (two) times daily.        Allergies:  Allergies  Allergen Reactions   Adhesive [Tape] Other (See Comments)     Blisters    Estrogens Other (See Comments)    Migraines    Other Other (See Comments)   Sulfonamide Derivatives Rash    Family History: Family History  Problem Relation Age of Onset   Ovarian cancer Mother    Diabetes Father    Heart disease Father    Breast cancer Sister    Liver cancer Sister    Colon cancer Cousin        Paternal side   Diabetes Maternal Aunt    Liver disease Cousin        Maternal side, never drank    Social History:  reports that she has never smoked. She has never used smokeless tobacco. She reports that she does not drink alcohol and does not use drugs.  ROS: All other review of systems were reviewed and are negative except what is noted above in HPI  Physical Exam: BP 132/60   Pulse 82   Constitutional:  Alert and oriented, No acute distress. HEENT: [place] AT, moist mucus membranes.   Trachea midline, no masses. Cardiovascular: No clubbing, cyanosis, or edema. Respiratory: Normal respiratory effort, no increased work of breathing. GI: Abdomen is soft, nontender, nondistended, no abdominal masses GU: No CVA tenderness.  Lymph: No cervical or inguinal lymphadenopathy. Skin: No rashes, bruises or suspicious lesions. Neurologic: Grossly intact, no focal deficits, moving all 4 extremities. Psychiatric: Normal  mood and affect.  Laboratory Data: Lab Results  Component Value Date   WBC 6.9 07/28/2023   HGB 12.6 07/28/2023   HCT 39.7 07/28/2023   MCV 92 07/28/2023   PLT 227 07/28/2023    Lab Results  Component Value Date   CREATININE 0.74 11/24/2023    No results found for: PSA  No results found for: TESTOSTERONE  Lab Results  Component Value Date   HGBA1C 6.1 (H) 07/28/2023    Urinalysis    Component Value Date/Time   COLORURINE YELLOW 03/15/2021 1740   APPEARANCEUR Clear 01/12/2024 1535   LABSPEC 1.015 03/15/2021 1740   PHURINE 5.0 03/15/2021 1740   GLUCOSEU Negative 01/12/2024 1535   HGBUR NEGATIVE 03/15/2021 1740   BILIRUBINUR Negative 01/12/2024 1535   KETONESUR negative 08/22/2023 1744   KETONESUR NEGATIVE 03/15/2021 1740   PROTEINUR Negative 01/12/2024 1535   PROTEINUR 30 (A) 03/15/2021 1740   UROBILINOGEN 0.2 08/22/2023 1744   UROBILINOGEN 0.2 04/08/2008 1454   NITRITE Negative 01/12/2024 1535   NITRITE NEGATIVE 03/15/2021 1740   LEUKOCYTESUR Trace (A) 01/12/2024 1535   LEUKOCYTESUR LARGE (A) 03/15/2021 1740    Lab Results  Component Value Date   LABMICR See below: 01/12/2024   WBCUA 6-10 (A) 01/12/2024   LABEPIT 0-10 01/12/2024   MUCUS Present 08/29/2021   BACTERIA None seen 01/12/2024    Pertinent Imaging: *** Results for orders placed in visit on 10/09/22  DG Abd 1 View  Narrative CLINICAL DATA:  Abdominal pain.  EXAM: ABDOMEN - 1 VIEW  COMPARISON:  None Available.  FINDINGS: The bowel gas pattern is normal  without gaseous distention. No radio-opaque calculi or other significant radiographic abnormality are seen. Increased stool noted consistent with constipation.  IMPRESSION: Constipation.   Electronically Signed By: Fonda Field M.D. On: 10/17/2022 15:45  No results found for this or any previous visit.  No results found for this or any previous visit.  No results found for this or any previous visit.  Results for orders placed during the hospital encounter of 07/11/21  US  RENAL  Narrative CLINICAL DATA:  History of nephrolithiasis.  Follow-up exam.  EXAM: RENAL / URINARY TRACT ULTRASOUND COMPLETE  COMPARISON:  Ultrasound renal 04/11/2021  FINDINGS: Right Kidney:  Renal measurements: 12.5 x 5.3 x 4.8 cm = volume: 166.2 mL. Echogenicity within normal limits. No mass or hydronephrosis visualized. Superior pole cyst measuring up to 3.5 cm.  Left Kidney:  Renal measurements: 11.3 x 5.7 x 5.0 cm = volume: 169.3 mL. Echogenicity within normal limits. No mass or hydronephrosis visualized. There are multiple cysts measuring up to 3 cm within the superior pole. There are at least 2 echogenic shadowing foci favored to represent stones measuring up to 5 mm.  Bladder:  Appears normal for degree of bladder distention.  Other:  None.  IMPRESSION: No hydronephrosis.  Probable left nephrolithiasis.   Electronically Signed By: Bard Moats M.D. On: 07/12/2021 08:42  No results found for this or any previous visit.  No results found for this or any previous visit.  Results for orders placed during the hospital encounter of 07/01/23  CT RENAL STONE STUDY  Narrative EXAMINATION: CT RENAL STONE STUDY  CLINICAL INDICATION: Female, 89 years old. Abdominal/flank pain, stone suspected  TECHNIQUE: Helical CT scan examination of the abdomen and pelvis is performed from the domes of the diaphragm to the pubic symphysis. Limited evaluation of the solid organs due to  lack of intravenous contrast. Unless otherwise specified, incidental thyroid , adrenal, renal lesions  do not require dedicated imaging follow up. Additionally, any mentioned pulmonary nodules do not require dedicated imaging follow-up based on the Fleischner guidelines unless otherwise specified. Coronary calcifications are not identified unless otherwise specified.  COMPARISON: 03/10/2022  FINDINGS:  There bronchiectatic changes noted in the right middle lobe with scarring noted in the lung bases. The heart is normal in size. There are coronary calcifications. The liver appears normal. The gallbladder is surgically absent. The spleen is normal. The pancreas appears normal. The adrenals are normal. Bilateral renal cysts are noted. Subcentimeter probable hemorrhagic left renal cyst is seen. The abdominal aorta is normal in caliber. Scattered calcified atherosclerotic changes are present. The urinary bladder appears normal though note is made of pelvic floor laxity. The uterus is surgically absent. There is colonic diverticulosis. Large and small bowel loops are otherwise within normal limits. There is no free fluid or pathologic lymphadenopathy by size criteria. There is diffuse osseous demineralization. There are old compression fractures at L1 and L5. There are degenerative changes of the spine and bony pelvis. Small fat-containing left inguinal hernia noted.  IMPRESSION:  No acute findings in the abdomen or pelvis. No definite renal calculi or hydronephrosis appreciated.  Various findings as above.  DOSE REDUCTION: This exam was performed according to our departmental dose-optimization program which includes automated exposure control, adjustment of the mA and/or kV according to patient size and/or use of iterative reconstruction technique.  Electronically signed by: Chad Engel MD 07/01/2023 07:19 PM EDT RP Workstation: [id number]   Assessment & Plan:    1. Nephrolithiasis  (Primary) CT stone study - Urinalysis, Routine w reflex microscopic - BLADDER SCAN AMB NON-IMAGING  2. Interstitial cystitis Continue bladder instillations  4. Urinary urgency Continue mirabegron  50mg  daily  5. Urge incontinence Continue mirabegron  50mg  daily   No follow-ups on file.  Belvie Clara, MD  [facility]

## 2024-01-21 NOTE — Progress Notes (Unsigned)
   Patient can void prior to the bladder scan. Bladder scan result: 212  Performed By: [facility] LPN

## 2024-01-22 ENCOUNTER — Ambulatory Visit (HOSPITAL_COMMUNITY): Admission: RE | Admit: 2024-01-22 | Discharge: 2024-01-22 | Attending: Urology

## 2024-01-22 DIAGNOSIS — N200 Calculus of kidney: Secondary | ICD-10-CM | POA: Insufficient documentation

## 2024-01-27 ENCOUNTER — Encounter: Payer: Self-pay | Admitting: Urology

## 2024-01-27 NOTE — Patient Instructions (Signed)
 Interstitial Cystitis  Interstitial cystitis (IC) is inflammation of the bladder. It's also called painful bladder syndrome. It can cause pain near your bladder. It can also make you have to pee urgently and often. IC may flare up and then go away for a while. In some cases, it may become a long-term (chronic) problem. What are the causes? The cause of IC isn't known. What increases the risk? You may be more likely to get IC if: You're female. You have certain other conditions. These include: Fibromyalgia. Irritable bowel syndrome (IBS). Endometriosis. Chronic fatigue syndrome. You may have worse symptoms if: You're under a lot of stress. You smoke. You have certain foods or drinks. What are the signs or symptoms? Your symptoms may change over time. They may include: Discomfort or pain near your bladder. The pain may range from mild to very bad. You may have more or less pain as your bladder fills with pee and empties. Pain in your pelvic area. This is the area between your hip bones. Needing to pee often or all the time. Pain when you pee. Pain during sex. Blood in your pee. Tiredness. If you're female, your symptoms may get worse when you have your menstrual period. How is this diagnosed? IC is diagnosed based on your symptoms, your medical history, and an exam. Your health care provider may need to rule out other conditions. They may do tests, such as: Pee tests. A cystoscopy. This test looks at the inside of your bladder. Biopsy. This is when a small piece of tissue is removed from your bladder for testing. How is this treated? There's no cure for IC. But treatment can help you manage your symptoms. Work with your provider to find the best treatments for you. These may include: Medicines to help with pain or to reduce how often you feel the need to pee. These may be given by mouth or put in your bladder using a soft tube called a catheter. Diet changes. Taking steps to  manage stress. Physical therapy. This may include: Exercises. These can help you relax your pelvic floor muscles. Massage. This may be done to relax tight muscles. Bladder training. This is when you learn ways to control when you pee. Neuromodulation therapy. This uses a device that's put on your back. It blocks the nerves that cause you to feel pain near your bladder. A procedure to stretch your bladder. This may be done by filling your bladder with air or fluid. Surgery. This is rare. It's only done if other treatments don't help. Follow these instructions at home: Eating and drinking Make changes to your diet as told by your provider. You may need to avoid: Spicy foods. Foods with acid in them, like tomatoes or citrus. Foods with a lot of potassium in them. Avoid drinking alcohol and caffeine. These drinks can make you have to pee more. Lifestyle Learn and practice ways to relax. These may include deep breathing and muscle relaxation. Get care for your mental well-being. You may need to: Do cognitive behavioral therapy (CBT). This therapy can change the way you think or act in response to things. It may help you feel better. See a therapist if you're depressed. Work with your provider on other ways to manage pain. Acupuncture may help. Do not use any products that contain nicotine or tobacco. These products include cigarettes, chewing tobacco, and vaping devices, such as e-cigarettes. If you need help quitting, ask your provider. Bladder training  Do bladder training as told. You  may need to: Pee at set, regular times. Train yourself to delay peeing. Keep a bladder diary. Write down: The times you pee. Any symptoms you have. The diary can help you find out what makes your symptoms worse. Use the diary to schedule times to pee. If you're away from home, plan to be near a bathroom at those times. Make sure you pee: Just before you leave the house. Just before you go to  bed. General instructions Take over-the-counter and prescription medicines only as told by your provider. Try putting a warm or cool cloth called a compress over your bladder. This can help with pain. Avoid wearing tight clothes. Do exercises as told by your provider. Where to find more information [employer]: urologyhealth.org Interstitial Cystitis Association (ICA): [employer] Contact a health care provider if: Your symptoms don't get better with treatment. Your pain or discomfort gets worse. You have to pee more often. You have little to no control over when you pee. You have a fever or chills. This information is not intended to replace advice given to you by your health care provider. Make sure you discuss any questions you have with your health care provider. Document Revised: 05/11/2022 Document Reviewed: 05/11/2022 Elsevier Patient Education  2024 [employer].

## 2024-01-28 ENCOUNTER — Telehealth: Payer: Self-pay

## 2024-01-28 DIAGNOSIS — N200 Calculus of kidney: Secondary | ICD-10-CM

## 2024-01-28 NOTE — Telephone Encounter (Signed)
 Patient called in about her CT Renal Stone Study results. Pt is aware message has been send to Dr. Sherrilee. Voiced understanding. Called pt back with verbal from Dr. Sherrilee  that there is just scar tissue, no kidney stone seen and will keep an eye on it with a repeat CT Renal Stone Study.

## 2024-02-10 ENCOUNTER — Ambulatory Visit (INDEPENDENT_AMBULATORY_CARE_PROVIDER_SITE_OTHER)

## 2024-02-10 DIAGNOSIS — N3010 Interstitial cystitis (chronic) without hematuria: Secondary | ICD-10-CM

## 2024-02-10 MED ORDER — SODIUM BICARBONATE 8.4 % IV SOLN
50.0000 meq | Freq: Once | INTRAVENOUS | Status: AC
  Administered 2024-02-10: 50 meq

## 2024-02-10 MED ORDER — DIMETHYL SULFOXIDE 50 % IS SOLN
50.0000 mL | Freq: Once | INTRAVESICAL | Status: AC
  Administered 2024-02-10: 50 mL via URETHRAL

## 2024-02-10 MED ORDER — LIDOCAINE HCL 2 % IJ SOLN
10.0000 mL | Freq: Once | INTRAMUSCULAR | Status: AC
  Administered 2024-02-10: 200 mg

## 2024-02-10 MED ORDER — HYDROCORTISONE SOD SUC (PF) 100 MG IJ SOLR
100.0000 mg | Freq: Once | INTRAMUSCULAR | Status: AC
  Administered 2024-02-10: 100 mg

## 2024-02-10 NOTE — Progress Notes (Addendum)
 Bladder Instillation DMSO  Due to Interstitial cystitis  patient is present today for a Bladder Instillation of DMSO treatment. Patient was cleaned and prepped in a sterile fashion with Betadinex3.  A 20 FR catheter was inserted, urine return was noted 20 ml, urine was Clear yellow In color.  DMSO treatment was instilled into the bladder. The catheter was then removed. Patient tolerated well, no complications were noted pt was instructed to hold the instillation for 20 minutes and then will void it out. Pt voiced understanding.    Preformed by: Deborah Gray, CMA  Follow up/ Additional notes: Keep monthly DMSO

## 2024-02-11 LAB — URINALYSIS, ROUTINE W REFLEX MICROSCOPIC
Bilirubin, UA: NEGATIVE
Glucose, UA: NEGATIVE
Ketones, UA: NEGATIVE
Nitrite, UA: NEGATIVE
Protein,UA: NEGATIVE
RBC, UA: NEGATIVE
Specific Gravity, UA: 1.01 (ref 1.005–1.030)
Urobilinogen, Ur: 0.2 mg/dL (ref 0.2–1.0)
pH, UA: 6 (ref 5.0–7.5)

## 2024-02-11 LAB — MICROSCOPIC EXAMINATION: Bacteria, UA: NONE SEEN

## 2024-02-17 ENCOUNTER — Telehealth: Payer: Self-pay

## 2024-02-17 NOTE — Telephone Encounter (Signed)
 Patient called stating she went to [facility] for a UTI due to having diarrhea over the holidays (Friday) she was prescribed macrobid  patient states she just wanted to make us  aware and also ask if she needed to be seen by us . Pt was advised that once she completes her antibiotics if she is still having symptoms to call us  back   Dysuria  Patient called with c/o dysuria x 3-5 days days.  Pain: when bladder is full its a lot of pain until she empties then its its a dull pain   Severity:7/10  Associated Signs and Symptoms:  Fever: no Chills: no Hematuria: no Urgency: yes Frequency: yes Hesitancy:sometimes  Incontinence: yes Nausea: no Vomiting: no  Urologic History:  Any Recent Urologic Surgeries or Procedures:yes DMSO treatment 12/23 Recurrent UTI's:yes Cystitis: yes  Prostatitis:no Kidney or Bladder Stones: past Plan: Walk-in Clinic: no Appointment w/Physician: [no Lab visit scheduled for urine drop off: No Advice given:  Do you take on daily medications for UTI suppression No    Patient called stating she went to [facility] for a UTI due to having diarrhea over the holidays (Friday) she was prescribed macrobid 

## 2024-02-25 ENCOUNTER — Telehealth: Payer: Self-pay

## 2024-02-25 DIAGNOSIS — N200 Calculus of kidney: Secondary | ICD-10-CM

## 2024-02-25 NOTE — Telephone Encounter (Signed)
 Pt was made aware and voiced understanding. Verbal from Dr. Sherrilee pt need to get Renal US  instead of CT renal stone study.

## 2024-02-25 NOTE — Telephone Encounter (Signed)
-----   Message from Belvie Clara, MD sent at 02/24/2024  8:20 AM EST ----- She has a UPJ obstruction versus recently passed stone. Followup 6 monhts with a renal US  ----- Message ----- From: Gretta Carlos SAUNDERS, CMA Sent: 01/28/2024   9:58 AM EST To: Belvie LITTIE Clara, MD  Please review and advise

## 2024-03-01 ENCOUNTER — Other Ambulatory Visit: Payer: Self-pay

## 2024-03-01 ENCOUNTER — Telehealth: Payer: Self-pay

## 2024-03-01 NOTE — Telephone Encounter (Signed)
 Return call to patient and pt state's she was having bladder spasm. Pt state's she took AZO and she will have her PCP  do a urinalysis if she is still experiencing urinary issues.

## 2024-03-02 ENCOUNTER — Other Ambulatory Visit: Payer: Self-pay

## 2024-03-02 ENCOUNTER — Encounter: Payer: Self-pay | Admitting: Family Medicine

## 2024-03-02 ENCOUNTER — Ambulatory Visit: Payer: Self-pay

## 2024-03-02 ENCOUNTER — Telehealth: Payer: Self-pay

## 2024-03-02 ENCOUNTER — Other Ambulatory Visit

## 2024-03-02 ENCOUNTER — Ambulatory Visit: Admitting: Family Medicine

## 2024-03-02 VITALS — BP 130/61 | HR 98 | Temp 98.2°F | Ht 64.0 in | Wt 142.0 lb

## 2024-03-02 DIAGNOSIS — I10 Essential (primary) hypertension: Secondary | ICD-10-CM | POA: Diagnosis not present

## 2024-03-02 DIAGNOSIS — R300 Dysuria: Secondary | ICD-10-CM

## 2024-03-02 DIAGNOSIS — N200 Calculus of kidney: Secondary | ICD-10-CM

## 2024-03-02 LAB — MICROSCOPIC EXAMINATION: Bacteria, UA: NONE SEEN

## 2024-03-02 LAB — URINALYSIS, ROUTINE W REFLEX MICROSCOPIC
Bilirubin, UA: NEGATIVE
Glucose, UA: NEGATIVE
Ketones, UA: NEGATIVE
Nitrite, UA: NEGATIVE
Protein,UA: NEGATIVE
RBC, UA: NEGATIVE
Specific Gravity, UA: <1.005 — ABNORMAL LOW (ref 1.005–1.030)
Urobilinogen, Ur: 0.2 mg/dL (ref 0.2–1.0)
pH, UA: 6 (ref 5.0–7.5)

## 2024-03-02 NOTE — Telephone Encounter (Signed)
 Tried calling pt with no answer. LVM

## 2024-03-02 NOTE — Telephone Encounter (Signed)
-----   Message from Garnette Shack, MD sent at 03/02/2024  4:34 PM EST ----- Patient know that urinalysis looks fine, no evidence of infection

## 2024-03-02 NOTE — Telephone Encounter (Signed)
 Dysuria  Patient called with c/o dysuria x 1 week days.  Pain: burning  Severity:7/10  Associated Signs and Symptoms:  Fever: noTemp.0 Chills: no Hematuria: no Urgency: yes Frequency: yes Hesitancy:no Incontinence: no Nausea: no Vomiting: no  Urologic History:  Any Recent Urologic Surgeries or Procedures:no Recurrent UTI's:yes Cystitis: yes  Prostatitis:no Kidney or Bladder Stones: yes Plan: Walk-in Clinic: no Appointment w/Physician: [no Lab visit scheduled for urine drop off: Yes Advice given:  Do you take on daily medications for UTI suppression No

## 2024-03-02 NOTE — Patient Instructions (Addendum)
 BP is good here today.  Keep a close eye.  Follow up in 3-6 months.

## 2024-03-03 ENCOUNTER — Telehealth: Payer: Self-pay

## 2024-03-03 MED ORDER — LOSARTAN POTASSIUM 100 MG PO TABS: 100.0000 mg | ORAL_TABLET | Freq: Every day | ORAL | 1 refills | Status: AC

## 2024-03-03 NOTE — Telephone Encounter (Signed)
 Pt return call. Pt state's she is having abdomen pain across her stomach when her bladder is full. Pt state's her left side hurt and she is not sure why she is in so much pain. Pt mention that MD, McKenzie state's she may have scar tissue and her kidney were swallow. Pt is aware a message will be sent to MD on advisement.

## 2024-03-03 NOTE — Assessment & Plan Note (Signed)
 Stable. Continue losartan

## 2024-03-03 NOTE — Progress Notes (Signed)
 "  Subjective:  Patient ID: Deborah Gray, female    DOB: 11-18-1933  Age: 89 y.o. MRN: [id number]  CC:   Concern regarding blood pressure  HPI:  89 year old female presents for evaluation of the above.  Patient reports that her blood pressure has recently been elevated on several occasions.  However, overall her blood pressure is fairly well-controlled.  BP 130/61 here today.  She reports fatigue but is overall doing fine at this time.  Compliant with losartan .  Patient Active Problem List   Diagnosis Date Noted   Fatty liver 11/17/2023   Lower extremity edema 11/04/2023   Urge incontinence 08/04/2023   Upper airway cough syndrome 12/26/2022   Interstitial cystitis 10/24/2022   History of recurrent UTIs 10/24/2022   Bilateral renal cysts 10/24/2022   Osteoporosis 07/05/2022   Multiple pulmonary nodules determined by computed tomography of lung 03/21/2022   Nephrolithiasis 03/15/2021   Prolapsed internal hemorrhoids, grade 3 01/23/2021   Spinal stenosis of lumbar region 12/02/2017   Osteoarthritis of left knee 12/02/2017   Degeneration of lumbar intervertebral disc 11/18/2017   IBS (irritable bowel syndrome) 01/30/2015   Type 2 diabetes mellitus (HCC) 12/22/2014   Hyperlipidemia, unspecified 12/22/2014   Essential hypertension 11/25/2013   Chronic pancreatitis (HCC) 02/26/2011   Gastroesophageal reflux disease 01/18/2011    Social Hx   Social History   Socioeconomic History   Marital status: Widowed    Spouse name: Not on file   Number of children: 3   Years of education: Not on file   Highest education level: Not on file  Occupational History   Occupation: Retired  Tobacco Use   Smoking status: Never   Smokeless tobacco: Never   Tobacco comments:    Never smoked  Vaping Use   Vaping status: Never Used  Substance and Sexual Activity   Alcohol use: No    Alcohol/week: 0.0 standard drinks of alcohol   Drug use: No   Sexual activity: Not Currently  Other  Topics Concern   Not on file  Social History Narrative   Widowed since 07/2018. Married x 61 years.   3 sons. 46, 1963 and 1966.   Social Drivers of Health   Tobacco Use: Low Risk (03/02/2024)   Patient History    Smoking Tobacco Use: Never    Smokeless Tobacco Use: Never    Passive Exposure: Not on file  Financial Resource Strain: Low Risk (05/31/2022)   Overall Financial Resource Strain (CARDIA)    Difficulty of Paying Living Expenses: Not hard at all  Food Insecurity: No Food Insecurity (05/31/2022)   Hunger Vital Sign    Worried About Running Out of Food in the Last Year: Never true    Ran Out of Food in the Last Year: Never true  Transportation Needs: No Transportation Needs (05/31/2022)   PRAPARE - Administrator, Civil Service (Medical): No    Lack of Transportation (Non-Medical): No  Physical Activity: Sufficiently Active (05/31/2022)   Exercise Vital Sign    Days of Exercise per Week: 5 days    Minutes of Exercise per Session: 30 min  Stress: No Stress Concern Present (05/31/2022)   [employer] of Occupational Health - Occupational Stress Questionnaire    Feeling of Stress : Not at all  Social Connections: Moderately Integrated (05/31/2022)   Social Connection and Isolation Panel    Frequency of Communication with Friends and Family: More than three times a week    Frequency of Social Gatherings with Friends  and Family: More than three times a week    Attends Religious Services: More than 4 times per year    Active Member of Clubs or Organizations: Yes    Attends Banker Meetings: More than 4 times per year    Marital Status: Widowed  Depression (PHQ2-9): Low Risk (03/02/2024)   Depression (PHQ2-9)    PHQ-2 Score: 3  Alcohol Screen: Low Risk (05/31/2022)   Alcohol Screen    Last Alcohol Screening Score (AUDIT): 0  Housing: Low Risk (05/31/2022)   Housing    Last Housing Risk Score: 0  Utilities: Not At Risk (05/31/2022)   AHC Utilities     Threatened with loss of utilities: No  Health Literacy: Not on file    Review of Systems Per HPI  Objective:  BP 130/61   Pulse 98   Temp 98.2 F (36.8 C)   Ht 5' 4 (1.626 m)   Wt 142 lb (64.4 kg)   SpO2 96%   BMI 24.37 kg/m      03/02/2024    3:11 PM 01/21/2024   11:48 AM 11/19/2023    3:12 PM  BP/Weight  Systolic BP 130 132 145  Diastolic BP 61 60 66  Wt. (Lbs) 142  138  BMI 24.37 kg/m2  23.69 kg/m2    Physical Exam Vitals and nursing note reviewed.  Constitutional:      General: She is not in acute distress.    Appearance: Normal appearance.  Cardiovascular:     Rate and Rhythm: Normal rate and regular rhythm.  Pulmonary:     Effort: Pulmonary effort is normal.     Breath sounds: Normal breath sounds.  Neurological:     Mental Status: She is alert.  Psychiatric:        Mood and Affect: Mood normal.        Behavior: Behavior normal.     Lab Results  Component Value Date   WBC 6.9 07/28/2023   HGB 12.6 07/28/2023   HCT 39.7 07/28/2023   PLT 227 07/28/2023   GLUCOSE 123 (H) 11/24/2023   CHOL 166 07/28/2023   TRIG 52 07/28/2023   HDL 68 07/28/2023   LDLCALC 87 07/28/2023   ALT 15 07/28/2023   AST 15 07/28/2023   NA 141 11/24/2023   K 5.1 11/24/2023   CL 102 11/24/2023   CREATININE 0.74 11/24/2023   BUN 19 11/24/2023   CO2 24 11/24/2023   TSH 2.720 11/19/2021   INR 1.7 (H) 04/08/2008   HGBA1C 6.1 (H) 07/28/2023   MICROALBUR 1.0 11/22/2013     Assessment & Plan:  Essential hypertension Assessment & Plan: Stable.  Continue losartan .  Orders: -     Losartan  Potassium; Take 1 tablet (100 mg total) by mouth daily.  Dispense: 90 tablet; Refill: 1    Follow-up: 3 to 6 months  Rosezetta Balderston Bluford DO [facility] "

## 2024-03-03 NOTE — Telephone Encounter (Signed)
 Open in error

## 2024-03-05 LAB — URINE CULTURE

## 2024-03-10 ENCOUNTER — Other Ambulatory Visit: Payer: Self-pay | Admitting: Gastroenterology

## 2024-03-12 NOTE — Telephone Encounter (Addendum)
 Detailed VM Her UA is normal. Please repeat Ct stone study

## 2024-03-12 NOTE — Telephone Encounter (Signed)
-----   Message from Garnette Shack, MD sent at 03/08/2024  9:00 AM EST ----- I do not really understand this message.  I will only be here for short-term, and if she has new issues, she should have all messages directed toward Dr. Sherrilee.

## 2024-03-16 ENCOUNTER — Ambulatory Visit

## 2024-03-16 ENCOUNTER — Ambulatory Visit (INDEPENDENT_AMBULATORY_CARE_PROVIDER_SITE_OTHER): Admitting: Otolaryngology

## 2024-03-17 ENCOUNTER — Other Ambulatory Visit: Payer: Self-pay | Admitting: Family Medicine

## 2024-03-17 ENCOUNTER — Ambulatory Visit (INDEPENDENT_AMBULATORY_CARE_PROVIDER_SITE_OTHER)

## 2024-03-17 ENCOUNTER — Ambulatory Visit

## 2024-03-17 DIAGNOSIS — E119 Type 2 diabetes mellitus without complications: Secondary | ICD-10-CM

## 2024-03-17 DIAGNOSIS — N3010 Interstitial cystitis (chronic) without hematuria: Secondary | ICD-10-CM

## 2024-03-17 MED ORDER — LIDOCAINE HCL 2 % IJ SOLN
10.0000 mL | Freq: Once | INTRAMUSCULAR | Status: AC
  Administered 2024-03-17: 200 mg

## 2024-03-17 MED ORDER — HYDROCORTISONE SOD SUC (PF) 100 MG IJ SOLR
100.0000 mg | Freq: Once | INTRAMUSCULAR | Status: AC
  Administered 2024-03-17: 100 mg

## 2024-03-17 MED ORDER — SODIUM BICARBONATE 8.4 % IV SOLN
50.0000 meq | Freq: Once | INTRAVENOUS | Status: AC
  Administered 2024-03-17: 50 meq

## 2024-03-17 MED ORDER — DIMETHYL SULFOXIDE 50 % IS SOLN
50.0000 mL | Freq: Once | INTRAVESICAL | Status: AC
  Administered 2024-03-17: 50 mL via URETHRAL

## 2024-03-17 MED ORDER — PHENAZOPYRIDINE HCL 100 MG PO TABS: 100.0000 mg | ORAL_TABLET | Freq: Three times a day (TID) | ORAL | 1 refills | Status: AC | PRN

## 2024-03-17 NOTE — Addendum Note (Signed)
 Addended by: GRETTA MASTERS R on: 03/17/2024 03:34 PM   Modules accepted: Orders

## 2024-03-17 NOTE — Progress Notes (Signed)
 Bladder Instillation DMSO  Due to Interstitial Cystitis patient is present today for a Bladder Instillation of DMSO treatment. Patient was cleaned and prepped in a sterile fashion with Betadinex3.  A 20 FR catheter was inserted, urine return was noted 100 ml, urine was Clear yellow In color.  DMSO treatment was instilled into the bladder. The catheter was then removed. Patient tolerated well, no complications were noted pt was instructed to hold the instillation for 20 minutes and then will void it out. Pt voiced understanding.    Preformed by: Carlos, CMA  Follow up/ Additional notes: Keep monthly DMSO

## 2024-03-18 LAB — URINALYSIS, ROUTINE W REFLEX MICROSCOPIC
Bilirubin, UA: NEGATIVE
Glucose, UA: NEGATIVE
Ketones, UA: NEGATIVE
Nitrite, UA: NEGATIVE
Protein,UA: NEGATIVE
RBC, UA: NEGATIVE
Specific Gravity, UA: 1.01 (ref 1.005–1.030)
Urobilinogen, Ur: 0.2 mg/dL (ref 0.2–1.0)
pH, UA: 6 (ref 5.0–7.5)

## 2024-03-18 LAB — MICROSCOPIC EXAMINATION: Bacteria, UA: NONE SEEN

## 2024-03-19 ENCOUNTER — Telehealth: Payer: Self-pay

## 2024-03-19 NOTE — Telephone Encounter (Signed)
 Called pt to make her aware and voiced understanding. Pt will call back to discuss this option when the weather breaks.

## 2024-03-19 NOTE — Telephone Encounter (Signed)
-----   Message from Belvie Clara, MD sent at 03/09/2024  8:22 AM EST ----- Ct shows a UPJ obstruction. If she continues to have pain we can schedule her for diagnostic ureteroscopy to look at the area ----- Message ----- From: Gretta Carlos SAUNDERS, CMA Sent: 03/03/2024   3:19 PM EST To: Belvie LITTIE Clara, MD  Please review

## 2024-03-23 ENCOUNTER — Telehealth: Payer: Self-pay | Admitting: Urology

## 2024-03-23 NOTE — Telephone Encounter (Signed)
 SABRA

## 2024-03-25 ENCOUNTER — Ambulatory Visit: Payer: Self-pay

## 2024-03-25 ENCOUNTER — Ambulatory Visit: Admitting: Family Medicine

## 2024-03-25 VITALS — BP 156/78 | HR 71 | Temp 98.1°F | Ht 64.0 in | Wt 139.5 lb

## 2024-03-25 DIAGNOSIS — M545 Low back pain, unspecified: Secondary | ICD-10-CM

## 2024-03-25 MED ORDER — BACLOFEN 10 MG PO TABS: 5.0000 mg | ORAL_TABLET | Freq: Three times a day (TID) | ORAL | 0 refills | Status: AC | PRN

## 2024-03-25 NOTE — Patient Instructions (Signed)
 Xray at the hospital.  Medication as directed.

## 2024-03-25 NOTE — Telephone Encounter (Signed)
" °  FYI Only or Action Required?: FYI only for provider: appointment scheduled on 2/5.  Patient was last seen in primary care on 03/02/2024 by Cook, Jayce G, DO.  Called Nurse Triage reporting Back Pain.  Symptoms began a week ago.  Interventions attempted: OTC medications: tylneol and Rest, hydration, or home remedies.  Symptoms are: gradually worsening.  Triage Disposition: See HCP Within 4 Hours (Or PCP Triage)  Patient/caregiver understands and will follow disposition?: Yes  Message from Lonell PEDLAR sent at 03/25/2024  8:17 AM EST  Reason for Triage: back pain, struggling to get up/move, as well as sleep.   Reason for Disposition  [1] SEVERE back pain (e.g., excruciating, unable to do any normal activities) AND [2] not improved 2 hours after pain medicine  Answer Assessment - Initial Assessment Questions Low Back pain trying to get out of bed for the last week. Once she would get up and move around and do her exercises she would feel better.  3 nights ago- got worse left hip and low back, unable to sleep, pain 10/10. Hard to physically get out of bed. Denies paresthesias or weakness to the Lowers, loss of bowel or bladder control  Similar pain about 1.61yrs ago with pinched nerve and extreme pain- used prednisone and went to PT. Still using those exercises daily.   Pain is causing BP to elevate-- 183/77 this morning- denies CP, SOB, Vision changes, or headache. BGL 179 (normally between 115-140).   Appt with PCP this afternoon to assess. Patient will call back if needs to be made virtual if she can't get her car out. Understands ED precautions   1. ONSET: When did the pain begin? (e.g., minutes, hours, days)     3 dayas ago worsened 2. LOCATION: Where does it hurt? (upper, mid or lower back)     Mid-low back  3. SEVERITY: How bad is the pain?  (e.g., Scale 1-10; mild, moderate, or severe)     10/10 pain  4. PATTERN: Is the pain constant? (e.g., yes, no; constant,  intermittent)      Constant since 5pm yesterday  5. RADIATION: Does the pain shoot into your legs or somewhere else?     Left hip  6. CAUSE:  What do you think is causing the back pain?      Pinched nerve in pain- reaggregated  7. BACK OVERUSE:  Any recent lifting of heavy objects, strenuous work or exercise?     Denies  8. MEDICINES: What have you taken so far for the pain? (e.g., nothing, acetaminophen , NSAIDS)     Tylenol - doesn't help, tramadol -  9. NEUROLOGIC SYMPTOMS: Do you have any weakness, numbness, or problems with bowel/bladder control?     denies 10. OTHER SYMPTOMS: Do you have any other symptoms? (e.g., fever, abdomen pain, burning with urination, blood in urine)       Denies  Protocols used: Back Pain-A-AH  "

## 2024-03-25 NOTE — Telephone Encounter (Signed)
 Appt scheduled

## 2024-03-26 ENCOUNTER — Telehealth: Payer: Self-pay

## 2024-03-26 NOTE — Telephone Encounter (Signed)
 Copied from CRM [id number]. Topic: Clinical - Medication Question >> Mar 26, 2024 11:10 AM Nathanel BROCKS wrote: Reason for CRM: pt states that dr gave her for a muscle relaxer but he gave her nothing for pain. She needs a medication for pain and she can't wait until after the xray. Please call pt and advise.

## 2024-04-13 ENCOUNTER — Ambulatory Visit (INDEPENDENT_AMBULATORY_CARE_PROVIDER_SITE_OTHER): Admitting: Otolaryngology

## 2024-04-19 ENCOUNTER — Ambulatory Visit

## 2024-05-03 ENCOUNTER — Ambulatory Visit

## 2024-06-23 ENCOUNTER — Other Ambulatory Visit (HOSPITAL_COMMUNITY)

## 2024-08-11 ENCOUNTER — Ambulatory Visit: Admitting: Urology
